# Patient Record
Sex: Male | Born: 1935 | Race: White | Hispanic: No | Marital: Married | Smoking: Former smoker
Health system: Southern US, Community
[De-identification: ages and names within clinical notes are randomized; demographics above are authoritative.]

## PROBLEM LIST (undated history)

## (undated) DIAGNOSIS — H5440 Blindness, one eye, unspecified eye: Secondary | ICD-10-CM

## (undated) DIAGNOSIS — N189 Chronic kidney disease, unspecified: Secondary | ICD-10-CM

## (undated) DIAGNOSIS — E785 Hyperlipidemia, unspecified: Secondary | ICD-10-CM

## (undated) DIAGNOSIS — I5032 Chronic diastolic (congestive) heart failure: Secondary | ICD-10-CM

## (undated) DIAGNOSIS — M069 Rheumatoid arthritis, unspecified: Secondary | ICD-10-CM

## (undated) DIAGNOSIS — K219 Gastro-esophageal reflux disease without esophagitis: Secondary | ICD-10-CM

## (undated) DIAGNOSIS — R002 Palpitations: Secondary | ICD-10-CM

## (undated) DIAGNOSIS — N400 Enlarged prostate without lower urinary tract symptoms: Secondary | ICD-10-CM

## (undated) DIAGNOSIS — IMO0001 Reserved for inherently not codable concepts without codable children: Secondary | ICD-10-CM

## (undated) DIAGNOSIS — M1990 Unspecified osteoarthritis, unspecified site: Secondary | ICD-10-CM

## (undated) DIAGNOSIS — R609 Edema, unspecified: Secondary | ICD-10-CM

## (undated) DIAGNOSIS — K810 Acute cholecystitis: Secondary | ICD-10-CM

## (undated) DIAGNOSIS — Z8679 Personal history of other diseases of the circulatory system: Secondary | ICD-10-CM

## (undated) DIAGNOSIS — K449 Diaphragmatic hernia without obstruction or gangrene: Secondary | ICD-10-CM

## (undated) DIAGNOSIS — E039 Hypothyroidism, unspecified: Secondary | ICD-10-CM

## (undated) DIAGNOSIS — R0789 Other chest pain: Secondary | ICD-10-CM

## (undated) DIAGNOSIS — Z9289 Personal history of other medical treatment: Secondary | ICD-10-CM

## (undated) DIAGNOSIS — I499 Cardiac arrhythmia, unspecified: Secondary | ICD-10-CM

## (undated) HISTORY — DX: Other chest pain: R07.89

## (undated) HISTORY — PX: KNEE SURGERY: SHX244

## (undated) HISTORY — PX: PROSTATE CRYOABLATION: SUR358

## (undated) HISTORY — DX: Chronic kidney disease, unspecified: N18.9

## (undated) HISTORY — PX: SHOULDER SURGERY: SHX246

## (undated) HISTORY — DX: Gastro-esophageal reflux disease without esophagitis: K21.9

## (undated) HISTORY — DX: Benign prostatic hyperplasia without lower urinary tract symptoms: N40.0

## (undated) HISTORY — DX: Chronic diastolic (congestive) heart failure: I50.32

## (undated) HISTORY — DX: Unspecified osteoarthritis, unspecified site: M19.90

## (undated) HISTORY — DX: Edema, unspecified: R60.9

## (undated) HISTORY — DX: Palpitations: R00.2

## (undated) HISTORY — PX: HEMORRHOID SURGERY: SHX153

## (undated) HISTORY — DX: Hyperlipidemia, unspecified: E78.5

## (undated) HISTORY — DX: Personal history of other medical treatment: Z92.89

## (undated) HISTORY — DX: Personal history of other diseases of the circulatory system: Z86.79

## (undated) HISTORY — PX: HERNIA REPAIR: SHX51

## (undated) HISTORY — DX: Rheumatoid arthritis, unspecified: M06.9

## (undated) HISTORY — DX: Diaphragmatic hernia without obstruction or gangrene: K44.9

---

## 1957-02-26 DIAGNOSIS — H5440 Blindness, one eye, unspecified eye: Secondary | ICD-10-CM

## 1957-02-26 HISTORY — DX: Blindness, one eye, unspecified eye: H54.40

## 1998-02-19 ENCOUNTER — Ambulatory Visit (HOSPITAL_COMMUNITY): Admission: RE | Admit: 1998-02-19 | Discharge: 1998-02-19 | Payer: Self-pay | Admitting: Internal Medicine

## 1999-02-18 ENCOUNTER — Ambulatory Visit (HOSPITAL_BASED_OUTPATIENT_CLINIC_OR_DEPARTMENT_OTHER): Admission: RE | Admit: 1999-02-18 | Discharge: 1999-02-18 | Payer: Self-pay | Admitting: Urology

## 1999-11-22 HISTORY — PX: CATARACT EXTRACTION: SUR2

## 2000-03-01 ENCOUNTER — Encounter: Payer: Self-pay | Admitting: *Deleted

## 2000-03-01 ENCOUNTER — Inpatient Hospital Stay (HOSPITAL_COMMUNITY): Admission: EM | Admit: 2000-03-01 | Discharge: 2000-03-02 | Payer: Self-pay | Admitting: Emergency Medicine

## 2000-03-02 ENCOUNTER — Encounter: Payer: Self-pay | Admitting: *Deleted

## 2000-11-22 ENCOUNTER — Inpatient Hospital Stay (HOSPITAL_COMMUNITY): Admission: AD | Admit: 2000-11-22 | Discharge: 2000-11-23 | Payer: Self-pay | Admitting: *Deleted

## 2000-11-23 ENCOUNTER — Encounter: Payer: Self-pay | Admitting: *Deleted

## 2001-07-19 ENCOUNTER — Other Ambulatory Visit: Admission: RE | Admit: 2001-07-19 | Discharge: 2001-07-19 | Payer: Self-pay | Admitting: Urology

## 2001-07-19 ENCOUNTER — Encounter (INDEPENDENT_AMBULATORY_CARE_PROVIDER_SITE_OTHER): Payer: Self-pay | Admitting: Specialist

## 2002-08-21 ENCOUNTER — Ambulatory Visit (HOSPITAL_COMMUNITY): Admission: RE | Admit: 2002-08-21 | Discharge: 2002-08-21 | Payer: Self-pay | Admitting: *Deleted

## 2003-09-16 LAB — HM COLONOSCOPY

## 2004-10-01 ENCOUNTER — Ambulatory Visit: Payer: Self-pay | Admitting: Internal Medicine

## 2004-12-13 ENCOUNTER — Ambulatory Visit: Payer: Self-pay | Admitting: Internal Medicine

## 2005-07-04 ENCOUNTER — Ambulatory Visit: Payer: Self-pay | Admitting: Internal Medicine

## 2005-07-11 ENCOUNTER — Ambulatory Visit: Payer: Self-pay

## 2006-01-23 ENCOUNTER — Ambulatory Visit: Payer: Self-pay | Admitting: Internal Medicine

## 2006-05-16 ENCOUNTER — Ambulatory Visit: Payer: Self-pay | Admitting: Internal Medicine

## 2006-06-05 ENCOUNTER — Ambulatory Visit: Payer: Self-pay | Admitting: Internal Medicine

## 2006-07-05 ENCOUNTER — Ambulatory Visit: Payer: Self-pay | Admitting: Internal Medicine

## 2006-10-27 ENCOUNTER — Ambulatory Visit: Payer: Self-pay | Admitting: Cardiology

## 2006-10-30 ENCOUNTER — Encounter: Payer: Self-pay | Admitting: Cardiology

## 2006-10-30 ENCOUNTER — Ambulatory Visit: Payer: Self-pay | Admitting: Cardiology

## 2006-10-30 ENCOUNTER — Ambulatory Visit: Payer: Self-pay

## 2006-12-05 ENCOUNTER — Ambulatory Visit: Payer: Self-pay | Admitting: Internal Medicine

## 2007-04-05 ENCOUNTER — Encounter: Payer: Self-pay | Admitting: Internal Medicine

## 2007-04-05 DIAGNOSIS — M069 Rheumatoid arthritis, unspecified: Secondary | ICD-10-CM

## 2007-04-05 DIAGNOSIS — N400 Enlarged prostate without lower urinary tract symptoms: Secondary | ICD-10-CM | POA: Insufficient documentation

## 2007-04-05 DIAGNOSIS — Z8679 Personal history of other diseases of the circulatory system: Secondary | ICD-10-CM

## 2007-04-05 DIAGNOSIS — M1990 Unspecified osteoarthritis, unspecified site: Secondary | ICD-10-CM | POA: Insufficient documentation

## 2007-04-05 DIAGNOSIS — E785 Hyperlipidemia, unspecified: Secondary | ICD-10-CM

## 2007-04-05 HISTORY — DX: Rheumatoid arthritis, unspecified: M06.9

## 2007-04-05 HISTORY — DX: Hyperlipidemia, unspecified: E78.5

## 2007-04-05 HISTORY — DX: Unspecified osteoarthritis, unspecified site: M19.90

## 2007-04-05 HISTORY — DX: Benign prostatic hyperplasia without lower urinary tract symptoms: N40.0

## 2007-04-05 HISTORY — DX: Personal history of other diseases of the circulatory system: Z86.79

## 2007-08-07 ENCOUNTER — Encounter: Payer: Self-pay | Admitting: Internal Medicine

## 2007-08-07 ENCOUNTER — Telehealth: Payer: Self-pay | Admitting: Internal Medicine

## 2007-08-07 ENCOUNTER — Ambulatory Visit: Payer: Self-pay | Admitting: Cardiology

## 2007-08-15 ENCOUNTER — Ambulatory Visit: Payer: Self-pay

## 2007-08-15 ENCOUNTER — Encounter: Payer: Self-pay | Admitting: Internal Medicine

## 2007-10-15 ENCOUNTER — Telehealth: Payer: Self-pay | Admitting: Internal Medicine

## 2007-10-29 ENCOUNTER — Encounter: Payer: Self-pay | Admitting: Internal Medicine

## 2007-11-09 ENCOUNTER — Encounter: Payer: Self-pay | Admitting: Internal Medicine

## 2007-11-13 ENCOUNTER — Encounter: Payer: Self-pay | Admitting: Internal Medicine

## 2007-12-03 ENCOUNTER — Ambulatory Visit: Payer: Self-pay | Admitting: Internal Medicine

## 2007-12-03 DIAGNOSIS — K219 Gastro-esophageal reflux disease without esophagitis: Secondary | ICD-10-CM | POA: Insufficient documentation

## 2007-12-03 HISTORY — DX: Gastro-esophageal reflux disease without esophagitis: K21.9

## 2007-12-18 ENCOUNTER — Encounter: Payer: Self-pay | Admitting: Internal Medicine

## 2007-12-24 ENCOUNTER — Telehealth: Payer: Self-pay | Admitting: Internal Medicine

## 2007-12-25 ENCOUNTER — Encounter: Payer: Self-pay | Admitting: Internal Medicine

## 2008-01-03 ENCOUNTER — Telehealth (INDEPENDENT_AMBULATORY_CARE_PROVIDER_SITE_OTHER): Payer: Self-pay | Admitting: *Deleted

## 2008-01-29 ENCOUNTER — Telehealth: Payer: Self-pay | Admitting: Internal Medicine

## 2008-01-30 ENCOUNTER — Encounter: Admission: RE | Admit: 2008-01-30 | Discharge: 2008-01-30 | Payer: Self-pay | Admitting: Orthopedic Surgery

## 2008-02-04 ENCOUNTER — Ambulatory Visit (HOSPITAL_BASED_OUTPATIENT_CLINIC_OR_DEPARTMENT_OTHER): Admission: RE | Admit: 2008-02-04 | Discharge: 2008-02-04 | Payer: Self-pay | Admitting: Orthopedic Surgery

## 2008-05-01 ENCOUNTER — Ambulatory Visit: Payer: Self-pay | Admitting: Internal Medicine

## 2008-05-01 DIAGNOSIS — R609 Edema, unspecified: Secondary | ICD-10-CM

## 2008-05-01 HISTORY — DX: Edema, unspecified: R60.9

## 2008-06-03 ENCOUNTER — Encounter: Payer: Self-pay | Admitting: Internal Medicine

## 2008-09-03 ENCOUNTER — Encounter (INDEPENDENT_AMBULATORY_CARE_PROVIDER_SITE_OTHER): Payer: Self-pay

## 2008-09-17 ENCOUNTER — Ambulatory Visit: Payer: Self-pay | Admitting: Cardiology

## 2008-09-26 ENCOUNTER — Encounter: Payer: Self-pay | Admitting: Internal Medicine

## 2008-09-29 ENCOUNTER — Encounter: Payer: Self-pay | Admitting: Internal Medicine

## 2008-09-30 ENCOUNTER — Ambulatory Visit: Payer: Self-pay

## 2008-11-04 ENCOUNTER — Ambulatory Visit: Payer: Self-pay | Admitting: Internal Medicine

## 2008-12-07 ENCOUNTER — Emergency Department (HOSPITAL_COMMUNITY): Admission: EM | Admit: 2008-12-07 | Discharge: 2008-12-07 | Payer: Self-pay | Admitting: Emergency Medicine

## 2009-02-12 ENCOUNTER — Encounter: Payer: Self-pay | Admitting: Internal Medicine

## 2009-04-07 ENCOUNTER — Ambulatory Visit: Payer: Self-pay | Admitting: Internal Medicine

## 2009-04-25 DIAGNOSIS — R0789 Other chest pain: Secondary | ICD-10-CM | POA: Insufficient documentation

## 2009-04-25 HISTORY — DX: Other chest pain: R07.89

## 2009-04-29 ENCOUNTER — Ambulatory Visit: Payer: Self-pay | Admitting: Cardiology

## 2009-04-30 ENCOUNTER — Encounter: Payer: Self-pay | Admitting: Internal Medicine

## 2009-05-01 ENCOUNTER — Encounter: Payer: Self-pay | Admitting: Internal Medicine

## 2009-06-17 ENCOUNTER — Telehealth: Payer: Self-pay | Admitting: Internal Medicine

## 2009-06-22 ENCOUNTER — Ambulatory Visit: Payer: Self-pay | Admitting: Internal Medicine

## 2009-06-23 ENCOUNTER — Ambulatory Visit: Payer: Self-pay

## 2009-06-23 ENCOUNTER — Encounter: Payer: Self-pay | Admitting: Internal Medicine

## 2009-06-25 ENCOUNTER — Encounter: Payer: Self-pay | Admitting: Internal Medicine

## 2009-07-14 ENCOUNTER — Encounter (INDEPENDENT_AMBULATORY_CARE_PROVIDER_SITE_OTHER): Payer: Self-pay | Admitting: *Deleted

## 2009-07-20 ENCOUNTER — Encounter: Payer: Self-pay | Admitting: Internal Medicine

## 2009-08-07 ENCOUNTER — Encounter: Payer: Self-pay | Admitting: Internal Medicine

## 2009-08-10 ENCOUNTER — Encounter: Payer: Self-pay | Admitting: Internal Medicine

## 2009-08-12 ENCOUNTER — Encounter (INDEPENDENT_AMBULATORY_CARE_PROVIDER_SITE_OTHER): Payer: Self-pay | Admitting: *Deleted

## 2009-08-27 ENCOUNTER — Encounter: Payer: Self-pay | Admitting: Internal Medicine

## 2009-09-15 ENCOUNTER — Telehealth: Payer: Self-pay | Admitting: Internal Medicine

## 2009-10-01 ENCOUNTER — Encounter: Payer: Self-pay | Admitting: Internal Medicine

## 2009-11-04 ENCOUNTER — Ambulatory Visit: Payer: Self-pay | Admitting: Cardiology

## 2009-12-11 ENCOUNTER — Telehealth: Payer: Self-pay | Admitting: Internal Medicine

## 2009-12-15 ENCOUNTER — Encounter: Payer: Self-pay | Admitting: Cardiology

## 2009-12-28 ENCOUNTER — Ambulatory Visit: Payer: Self-pay | Admitting: Internal Medicine

## 2009-12-29 LAB — CONVERTED CEMR LAB
ALT: 18 units/L (ref 0–53)
AST: 20 units/L (ref 0–37)
Albumin: 4.1 g/dL (ref 3.5–5.2)
Alkaline Phosphatase: 59 units/L (ref 39–117)
BUN: 23 mg/dL (ref 6–23)
Basophils Absolute: 0 10*3/uL (ref 0.0–0.1)
Basophils Relative: 0.1 % (ref 0.0–3.0)
Bilirubin, Direct: 0.1 mg/dL (ref 0.0–0.3)
CO2: 30 meq/L (ref 19–32)
Calcium: 9.3 mg/dL (ref 8.4–10.5)
Chloride: 111 meq/L (ref 96–112)
Cholesterol: 140 mg/dL (ref 0–200)
Creatinine, Ser: 1.7 mg/dL — ABNORMAL HIGH (ref 0.4–1.5)
Eosinophils Absolute: 0.2 10*3/uL (ref 0.0–0.7)
Eosinophils Relative: 2.6 % (ref 0.0–5.0)
GFR calc non Af Amer: 40.51 mL/min (ref >60)
Glucose, Bld: 117 mg/dL — ABNORMAL HIGH (ref 70–99)
HCT: 44.6 % (ref 39.0–52.0)
HDL: 50.7 mg/dL (ref >39.00)
Hemoglobin: 15 g/dL (ref 13.0–17.0)
LDL Cholesterol: 76 mg/dL (ref 0–99)
Lymphocytes Relative: 18.8 % (ref 12.0–46.0)
Lymphs Abs: 1.1 10*3/uL (ref 0.7–4.0)
MCHC: 33.6 g/dL (ref 30.0–36.0)
MCV: 100 fL (ref 78.0–100.0)
Monocytes Absolute: 0.6 10*3/uL (ref 0.1–1.0)
Monocytes Relative: 10.6 % (ref 3.0–12.0)
Neutro Abs: 4 10*3/uL (ref 1.4–7.7)
Neutrophils Relative %: 67.9 % (ref 43.0–77.0)
PSA: 3.23 ng/mL (ref 0.10–4.00)
Platelets: 202 10*3/uL (ref 150.0–400.0)
Potassium: 4.4 meq/L (ref 3.5–5.1)
RBC: 4.46 M/uL (ref 4.22–5.81)
RDW: 13.2 % (ref 11.5–14.6)
Sodium: 146 meq/L — ABNORMAL HIGH (ref 135–145)
TSH: 2.92 microintl units/mL (ref 0.35–5.50)
Total Bilirubin: 0.6 mg/dL (ref 0.3–1.2)
Total CHOL/HDL Ratio: 3
Total Protein: 6.5 g/dL (ref 6.0–8.3)
Triglycerides: 69 mg/dL (ref 0.0–149.0)
VLDL: 13.8 mg/dL (ref 0.0–40.0)
WBC: 5.9 10*3/uL (ref 4.5–10.5)

## 2009-12-30 ENCOUNTER — Encounter: Payer: Self-pay | Admitting: Internal Medicine

## 2009-12-31 ENCOUNTER — Telehealth: Payer: Self-pay | Admitting: Internal Medicine

## 2010-02-12 ENCOUNTER — Ambulatory Visit: Payer: Self-pay | Admitting: Internal Medicine

## 2010-02-12 ENCOUNTER — Telehealth: Payer: Self-pay | Admitting: Internal Medicine

## 2010-02-12 DIAGNOSIS — R002 Palpitations: Secondary | ICD-10-CM

## 2010-02-12 HISTORY — DX: Palpitations: R00.2

## 2010-02-12 LAB — CONVERTED CEMR LAB
BUN: 20 mg/dL (ref 6–23)
CO2: 31 meq/L (ref 19–32)
Calcium: 9.8 mg/dL (ref 8.4–10.5)
Chloride: 106 meq/L (ref 96–112)
Creatinine, Ser: 1.6 mg/dL — ABNORMAL HIGH (ref 0.4–1.5)
GFR calc non Af Amer: 43.43 mL/min (ref >60)
Glucose, Bld: 103 mg/dL — ABNORMAL HIGH (ref 70–99)
Potassium: 4.3 meq/L (ref 3.5–5.1)
Sodium: 144 meq/L (ref 135–145)

## 2010-03-03 ENCOUNTER — Ambulatory Visit (HOSPITAL_COMMUNITY): Admission: RE | Admit: 2010-03-03 | Discharge: 2010-03-03 | Payer: Self-pay | Admitting: Internal Medicine

## 2010-03-03 ENCOUNTER — Ambulatory Visit: Payer: Self-pay

## 2010-03-03 ENCOUNTER — Encounter: Payer: Self-pay | Admitting: Internal Medicine

## 2010-03-03 ENCOUNTER — Ambulatory Visit: Payer: Self-pay | Admitting: Cardiovascular Disease

## 2010-03-30 ENCOUNTER — Encounter: Payer: Self-pay | Admitting: Internal Medicine

## 2010-03-31 ENCOUNTER — Encounter: Payer: Self-pay | Admitting: Internal Medicine

## 2010-04-22 ENCOUNTER — Telehealth: Payer: Self-pay | Admitting: Internal Medicine

## 2010-08-05 ENCOUNTER — Encounter: Payer: Self-pay | Admitting: Cardiology

## 2010-08-05 ENCOUNTER — Encounter: Payer: Self-pay | Admitting: Internal Medicine

## 2010-08-09 ENCOUNTER — Ambulatory Visit: Payer: Self-pay | Admitting: Cardiology

## 2010-08-12 ENCOUNTER — Telehealth: Payer: Self-pay | Admitting: Cardiology

## 2010-08-12 ENCOUNTER — Telehealth: Payer: Self-pay | Admitting: Internal Medicine

## 2010-10-28 ENCOUNTER — Encounter: Payer: Self-pay | Admitting: Internal Medicine

## 2010-12-21 NOTE — Progress Notes (Signed)
Summary: samples Plavix?  Phone Note Call from Patient Call back at Home Phone [phone]   Caller: live Call For: kwia Summary of Call: 1) Needs refill for the Plavix to [facility] mail order, phone number [phone].  He can't do it himself & isn't sure if they sent a request to Dr. Raliegh Ip. - done 2) Any samples of Plavix he could pick up early next week? Initial call taken by: Shelbie Hutching, RN,  December 11, 2009 10:07 AM  Follow-up for Phone Call        please refill and let [name] know that I will call when any samples arrive Follow-up by: Marletta Lor  MD,  December 11, 2009 12:37 PM  Additional Follow-up for Phone Call Additional follow up Details #1::        Phone Call Completed Additional Follow-up by: Shelbie Hutching, RN,  December 11, 2009 12:40 PM    Prescriptions: PLAVIX 75 MG TABS (CLOPIDOGREL BISULFATE) Take 1 tablet by mouth once a day  #90 x 6   Entered by:   Shelbie Hutching, RN   Authorized by:   Marletta Lor  MD   Signed by:   Shelbie Hutching, RN on 12/11/2009   Method used:   Faxed to ...       [facility] ([facility]order)             , [place]         Ph:        Fax: [phone]   RxID:   [id number]

## 2010-12-21 NOTE — Progress Notes (Signed)
Summary: WANT LABS FAXED TO HIM.   Phone Note Call from Patient Call back at Home Phone [phone]   Caller: Mom Summary of Call: PT WOULD LIKE HIS LAB RESULTS FAXED TO HIM IF POSSIBLE AT [id number] Initial call taken by: Delsa Sale,  February 12, 2010 3:42 PM  Follow-up for Phone Call        Dr.Shainna Faux called him with lab results. Follow-up by: Georgetta Haber RN

## 2010-12-21 NOTE — Medication Information (Signed)
Summary: Protonix  Protonix   Imported By: Marilynne Drivers 01/12/2010 16:42:29  _____________________________________________________________________  External Attachment:    Type:   Image     Comment:   External Document

## 2010-12-21 NOTE — Assessment & Plan Note (Signed)
Summary: cpx/mm---will fast//ccm   Vital Signs:  Patient profile:   77 year old male Weight:      153 pounds BP sitting:   120 / 80  (right arm) Cuff size:   regular  Vitals Entered By: Chipper Oman, RN (December 28, 2009 8:44 AM) CC: cpx , fasting , sees cardiologist    Primary Care Ramone Gander:  Marletta Lor  MD  CC:  cpx , fasting , and sees cardiologist .  History of Present Illness: 77  year-old patient, who is followed range [facility] for her rheumatoid arthritis.  Is also followed by cardiology.  He states that he has had a urological evaluation within the past few months.  He has a history of osteoarthritis in addition to his RA.  He has a history of BPH.  Additional problems include a history of TIAs Gastrosoft reflux disease and dyslipidemia.  He has chronic kidney disease with his last creatinine noted to be 1.62.  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 Hemorrhoidectomy 1973 Inguinal herniorrhaphy left-sided Dr. Annamaria Boots right cataract surgery left knee surgery  L shoulder surgery 3-09 Left cataract surgery 12-09  Family History: Reviewed history from 04/25/2009 and no changes required. father died at age 75  Social History: Reviewed history from 04/25/2009 and no changes required. 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         denies any exertional chest pain or any focal neurological symptoms  Physical Exam  General:  overweight-appearing.  112/72overweight-appearing.   Head:  Normocephalic and atraumatic without obvious abnormalities. No apparent alopecia or balding. Eyes:  dilated left pupil blind left eye; right pupil myotic and fundi not well-visualized Ears:  External ear exam shows no significant lesions or deformities.  Otoscopic examination reveals clear canals, tympanic membranes are intact bilaterally without bulging, retraction, inflammation or discharge. Hearing is grossly normal bilaterally. Mouth:  Oral mucosa and oropharynx without lesions or exudates.  upper dentures in place Neck:  No deformities, masses, or tenderness noted. Chest Wall:  No deformities, masses, tenderness or gynecomastia noted. Breasts:  No masses or gynecomastia noted Lungs:  Normal respiratory effort, chest expands symmetrically. Lungs are clear to auscultation, no crackles or wheezes. Heart:  Normal rate and regular rhythm. S1 and S2 normal without gallop, murmur, click, rub or other extra sounds. Abdomen:  Bowel sounds positive,abdomen soft and non-tender without masses, organomegaly or hernias noted. ventral hernia noted Rectal:  per urology, December 2010 Genitalia:  Testes bilaterally descended without nodularity, tenderness or masses. No scrotal masses or lesions. No penis lesions or urethral discharge. left testicle slightly  atrophic Prostate:  per urology, December 2010 Msk:  No deformity or scoliosis noted  of thoracic or lumbar spine.   Pulses:  faint Extremities:  No clubbing, cyanosis, edema, or deformity noted with normal full range of motion of all joints.   Neurologic:  alert & oriented X3, cranial nerves II-XII intact, strength normal in all extremities, sensation intact to light touch, sensation intact to pinprick, gait normal, DTRs symmetrical and normal, finger-to-nose normal, heel-to-shin normal, and toes down bilaterally on Babinski.  alert & oriented X3, cranial nerves II-XII intact, strength normal in all extremities, sensation intact to light touch, sensation intact to pinprick, gait normal, DTRs symmetrical and normal, finger-to-nose normal, heel-to-shin normal, and toes down bilaterally on Babinski.   Skin:  Intact without suspicious lesions or rashes Cervical Nodes:  No lymphadenopathy noted Axillary Nodes:  No palpable lymphadenopathy Inguinal Nodes:  No significant adenopathy Psych:  Cognition and judgment appear intact. Alert and cooperative with normal attention span and concentration. No apparent delusions, illusions, hallucinations   Impression & Recommendations:  Problem # 1:  HYPERLIPIDEMIA (ICD-272.4)  The following medications were removed from the medication list:    Vytorin 10-20 Mg Tabs (Ezetimibe-simvastatin) .Marland Kitchen... 1 tab once daily His updated medication list for this problem includes:    Simvastatin 20 Mg Tabs (Simvastatin) ..... One tablet daily  His updated medication list for this problem includes:    Vytorin 10-20 Mg Tabs (Ezetimibe-simvastatin) .Marland Kitchen... 1 tab once daily  Orders: Venipuncture [id number]) TLB-Lipid Panel (80061-LIPID) TLB-BMP (Basic Metabolic Panel-BMET) (99991111) TLB-CBC Platelet - w/Differential (85025-CBCD) TLB-Hepatic/Liver Function Pnl (80076-HEPATIC) TLB-TSH (Thyroid Stimulating Hormone) (84443-TSH)  Problem # 2:  TRANSIENT ISCHEMIC ATTACK, HX OF  (ICD-V12.50)  Orders: Prescription Created Electronically [phone])  Problem # 3:  GERD (ICD-530.81)  His updated medication list for this problem includes:    Pantoprazole Sodium 40 Mg Tbec (Pantoprazole sodium) .Marland Kitchen... 1 tab once daily  His updated medication list for this problem includes:    Pantoprazole Sodium 40 Mg Tbec (Pantoprazole sodium) .Marland Kitchen... 1 tab once daily  Orders: Venipuncture [id number]) TLB-BMP (Basic Metabolic Panel-BMET) (99991111) TLB-CBC Platelet - w/Differential (85025-CBCD) TLB-Hepatic/Liver Function Pnl (80076-HEPATIC) TLB-TSH (Thyroid Stimulating Hormone) (84443-TSH)  Problem # 4:  OSTEOARTHRITIS (ICD-715.90)  The following medications were removed from the medication list:    Mobic 7.5 Mg Tabs (Meloxicam) .Marland Kitchen... Take 1 tablet by mouth once a day His updated medication list for this problem includes:    Aspirin 81 Mg Chew (Aspirin) ..... One daily  His updated medication list for this problem includes:    Mobic 7.5 Mg Tabs (Meloxicam) .Marland Kitchen... Take 1 tablet by mouth once a day  Orders: Prescription Created Electronically [phone]) Venipuncture [id number]) TLB-BMP (Basic Metabolic Panel-BMET) (99991111) TLB-CBC Platelet - w/Differential (85025-CBCD) TLB-Hepatic/Liver Function Pnl (80076-HEPATIC) TLB-TSH (Thyroid Stimulating Hormone) (84443-TSH)  Complete Medication List: 1)  Folic Acid 1 Mg Tabs (Folic acid) .... Take 1 tablet by mouth once a day 2)  Methotrexate 2.5 Mg Tabs (Methotrexate sodium) .... Take 3 once a week 3)  Plavix 75 Mg Tabs (Clopidogrel bisulfate) .... Take 1 tablet by mouth once a day 4)  Pantoprazole Sodium 40 Mg Tbec (Pantoprazole sodium) .Marland Kitchen.. 1 tab once daily 5)  Multivitamins Tabs (Multiple vitamin) .Marland Kitchen.. 1 tab once daily 6)  Vitamin C 500 Mg Tabs (Ascorbic acid) .Marland Kitchen.. 1 tab once daily 7)  Calcium-vitamin D 250-125 Mg-unit Tabs (Calcium carbonate-vitamin d) .Marland Kitchen.. 1 tab once daily 8)  Simvastatin 20 Mg Tabs (Simvastatin) .... One tablet  daily 9)  Aspirin 81 Mg Chew (Aspirin) .... One daily  Other Orders: TLB-PSA (Prostate Specific Antigen) (84153-PSA)  Patient Instructions: 1)  Please schedule a follow-up appointment in 1 year. 2)  Limit your Sodium (Salt) to less than 2 grams a day(slightly less than 1/2 a teaspoon) to prevent fluid retention, swelling, or worsening of symptoms. 3)  It is important that you exercise regularly at least 20 minutes 5 times a week. If you develop chest pain, have severe difficulty breathing, or feel very tired , stop exercising immediately and seek medical attention. 4)  You need to lose weight. Consider a lower calorie diet and regular exercise.  5)  Take 650-1000mg  of Tylenol every 4-6 hours as needed for relief of pain or comfort of fever AVOID taking more than 4000mg   in a 24 hour period (can cause liver damage in higher doses). Prescriptions: SIMVASTATIN 20 MG TABS (SIMVASTATIN) one tablet daily  #90 x 6   Entered and Authorized by:   Marletta Lor  MD   Signed by:   Marletta Lor  MD on 12/28/2009   Method used:   Print then Give to Patient   RxID:   [id number] PANTOPRAZOLE SODIUM 40 MG TBEC (PANTOPRAZOLE SODIUM) 1 tab once daily  #90 x 6   Entered and Authorized by:   Marletta Lor  MD   Signed by:   Marletta Lor  MD on 12/28/2009   Method used:   Print then Give to Patient   RxID:   [id number] PLAVIX 75 MG TABS (CLOPIDOGREL BISULFATE) Take 1 tablet by mouth once a day  #90 x 6   Entered and Authorized by:   Marletta Lor  MD   Signed by:   Marletta Lor  MD on 12/28/2009   Method used:   Print then Give to Patient   RxID:   [id number] SIMVASTATIN 20 MG TABS (SIMVASTATIN) one tablet daily  #90 x 6   Entered and Authorized by:   Marletta Lor  MD   Signed by:   Marletta Lor  MD on 12/28/2009   Method used:   Electronically to        [facility] (mail-order)       [place]  [address]       Ph: [phone]       Fax: [phone]   RxIDOJ:[id number] PANTOPRAZOLE SODIUM 40 MG TBEC (PANTOPRAZOLE SODIUM) 1 tab once daily  #90 x 6   Entered and Authorized by:   Marletta Lor  MD   Signed by:   Marletta Lor  MD on 12/28/2009   Method used:   Electronically to        [facility] (mail-[place]  [address]       Ph: [phone]       Fax: [phone]   RxIDMC:[id number] PLAVIX 75 MG TABS (CLOPIDOGREL BISULFATE) Take 1 tablet by mouth once a day  #90 x 6   Entered and Authorized by:   Marletta Lor  MD   Signed by:   Marletta Lor  MD on 12/28/2009   Method used:   Electronically to        [facility] (mail-order)       [address] [place]  [address]       Ph:  [phone]       Fax: [phone]   RxIDBO:[id number]

## 2010-12-21 NOTE — Progress Notes (Signed)
Summary: lab results   Phone Note Call from Patient   Caller: Patient [phone] Reason for Call: Talk to Nurse Summary of Call: pt calling to see if blood work he faxed over has been recieved and wants results of cholesterol Initial call taken by: Lorenda Hatchet,  August 12, 2010 1:38 PM  Follow-up for Phone Call        pt given Dr Winnifred Friar recommendation of going back on statin, he states that looking at his #s he agrees and restarted 2 days ago Kevan Rosebush, RN  August 12, 2010 2:02 PM

## 2010-12-21 NOTE — Progress Notes (Signed)
Summary: Needs Samples  Phone Note Call from Patient   Caller: Patient Summary of Call: Needs samples of Vytorin and Protonix Initial call taken by: Despina Arias,  August 12, 2010 10:16 AM  Follow-up for Phone Call        done Follow-up by: Marletta Lor  MD,  August 12, 2010 11:21 AM

## 2010-12-21 NOTE — Letter (Signed)
Summary: [facility]   Imported By: Laural Benes 01/13/2010 10:16:30  _____________________________________________________________________  External Attachment:    Type:   Image     Comment:   External Document

## 2010-12-21 NOTE — Progress Notes (Signed)
Summary: INFO ONLY CONCERNING PNEUMOVAX  Phone Note Call from Patient   Caller: Patient [phone] Reason for Call: Talk to Nurse Summary of Call: INFO ONLYL--------Pt called to adv that he received his Pneumovax Vaccine yesterday (12/30/2009)... Adv that he wanted to make Dr Marthann Schiller nurse aware of same.  Initial call taken by: Duanne Moron,  December 31, 2009 11:09 AM      Immunization History:  Pneumovax Immunization History:    Pneumovax:  historical (12/30/2009)

## 2010-12-21 NOTE — Progress Notes (Signed)
Summary: REQUEST FOR SAMPLES  Phone Note Call from Patient   Caller: Patient  [phone] Reason for Call: Talk to Nurse, Talk to Doctor Summary of Call: Pt is requesting samples of some type of allergy medication to help alleviate his sxs...Marland KitchenMarland Kitchen Pt c/o watery eyes, runny nose and believes he has allergies.... Pt req that someone call him when they are ready for p/u..... Pt can be reached at [phone].  Initial call taken by: Duanne Moron,  April 22, 2010 8:45 AM  Follow-up for Phone Call        called Follow-up by: Marletta Lor  MD,  April 22, 2010 12:44 PM

## 2010-12-21 NOTE — Letter (Signed)
Summary: [facility]   Imported By: Laural Benes 04/07/2010 10:25:15  _____________________________________________________________________  External Attachment:    Type:   Image     Comment:   External Document

## 2010-12-21 NOTE — Assessment & Plan Note (Signed)
Summary: 6 month follow up/mt  Medications Added METOPROLOL TARTRATE 25 MG TABS (METOPROLOL TARTRATE) 1/2 tab once daily        Visit Type:  6 mo f/u Primary Provider:  Marletta Lor  MD  CC:  edema/left leg ......sob....denies any cp...pt c/o fatigue though he states he went to [facility] Saturday for [employer]...also states he went to a party yesterday.  History of Present Illness: [name] returns today for his management of palpitations.  He is only taking a half of a 25 mg tablet or 12.5 mg of metoprolol once a day. He has not had a flare of his palpitations or PVCs.  He denies any symptoms of TIAs or mini strokes. He continues on Plavix. He has discontinued his statin.  Carotid Dopplers in August of 2000 and showed nonobstructive plaque and antegrade flow in the vertebrals. 2-D echocardiogram showed EF of 60-65% with trivial aortic regurgitation. He did have some mild to moderate LVH.  He's had some peripheral edema which is about the same. He denies orthopnea or PND.  Current Medications (verified): 1)  Folic Acid 1 Mg Tabs (Folic Acid) .... Take 1 Tablet By Mouth Once A Day 2)  Methotrexate 2.5 Mg Tabs (Methotrexate Sodium) .... Take 3 Once A Week 3)  Plavix 75 Mg Tabs (Clopidogrel Bisulfate) .... Take 1 Tablet By Mouth Once A Day 4)  Pantoprazole Sodium 40 Mg Tbec (Pantoprazole Sodium) .Marland Kitchen.. 1 Tab Once Daily 5)  Multivitamins   Tabs (Multiple Vitamin) .Marland Kitchen.. 1 Tab Once Daily 6)  Vitamin C 500 Mg Tabs (Ascorbic Acid) .Marland Kitchen.. 1 Tab Once Daily 7)  Calcium-Carb 600 + D 600-125 Mg-Unit Tabs (Calcium-Vitamin D) .... Take One Daily 8)  Tylenol 325 Mg Tabs (Acetaminophen) .... As Needed 9)  Metoprolol Tartrate 25 Mg Tabs (Metoprolol Tartrate) .... 1/2 Tab Once Daily  Allergies: 1)  ! 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 Annamaria Boots right cataract surgery left knee surgery  L shoulder surgery 3-09 Left cataract surgery 12-09  Family History: Last updated: 2009/05/24 father died at age 66  Social History: Last updated: May 24, 2009 Married retired Administrator, Civil Service  Risk Factors: Smoking Status: quit (04/05/2007)  Review of Systems       negative other than history of present illness  Vital Signs:  Patient profile:   77 year old male Height:      63 inches Weight:      155.8 pounds Pulse rate:   84 / minute Pulse rhythm:   regular BP sitting:   112 / 78  (left arm) Cuff size:   large  Vitals Entered By: Julaine Hua, CMA (August 09, 2010 2:19 PM)  Physical Exam  General:  Well developed, well nourished, in no acute distress. Head:  normocephalic and atraumatic Eyes:  glasses otherwise normal Neck:  Neck supple, no JVD. No masses, thyromegaly or abnormal cervical nodes. Chest Tevan Marian:  no deformities or breast masses noted Lungs:  Clear bilaterally to auscultation and percussion. Heart:  PMI nondisplaced, regular rate and rhythm, no murmur. Carotid upstrokes equal bilaterally without bruits Msk:  decreased ROM.  Pulses:  diminished but present the lower extremities. Extremities:  dependent rubor1+ left pedal edema and 1+ right pedal edema.   Neurologic:  Alert and oriented x 3. Skin:  Intact without lesions or rashes. Psych:  Normal affect.   Impression & Recommendations:  Problem # 1:  PALPITATIONS (ICD-785.1) Assessment Improved  His updated medication list for this problem  includes:    Plavix 75 Mg Tabs (Clopidogrel bisulfate) .Marland Kitchen... Take 1 tablet by mouth once a day    Metoprolol Tartrate 25 Mg Tabs (Metoprolol tartrate) .Marland Kitchen... 1/2 tab once daily  His updated medication list for this problem includes:    Plavix 75 Mg Tabs (Clopidogrel bisulfate) .Marland Kitchen... Take 1 tablet by mouth once a day    Metoprolol Tartrate 25 Mg Tabs (Metoprolol tartrate) .Marland Kitchen... 1/2 tab once daily  Problem # 2:  PEDAL EDEMA (ICD-782.3) Assessment: Unchanged  Problem # 3:  TRANSIENT ISCHEMIC ATTACK, HX OF (ICD-V12.50) Assessment: Improved  Patient Instructions: 1)  Your physician recommends that you schedule a follow-up appointment in: 6 months with Dr. Verl Blalock 2)  Your physician recommends that you continue on your current medications as directed. Please refer to the Current Medication list given to you today.

## 2010-12-21 NOTE — Assessment & Plan Note (Signed)
Summary: palpitations  Medications Added CALCIUM-CARB 600 + D 600-125 MG-UNIT TABS (CALCIUM-VITAMIN D) take one daily VYTORIN 10-20 MG TABS (EZETIMIBE-SIMVASTATIN) Take one tablet by mouth daily at bedtime TYLENOL 325 MG TABS (ACETAMINOPHEN) as needed METOPROLOL TARTRATE 25 MG TABS (METOPROLOL TARTRATE) 1 tab 2 time per day        Primary Provider:  Marletta Lor  MD  CC:  palpitations.  History of Present Illness: Patient is an 77 year old with a history of PVCs, chest pain and dyslipidemia.   He presents today for evalution of palpitations. The patient says he was watching the [employer] basketball game last night.  He bagan to notice his heart skipping.  He denied dizziness.  No chest pain.  No shortness of breath.  He went to bed.  This morning he did not sense the palpitations but when he took his pulse he noticed it was irregular. He notes no recent chest pain.  He notes no change in his ability to do things.   Current Medications (verified): 1)  Folic Acid 1 Mg Tabs (Folic Acid) .... Take 1 Tablet By Mouth Once A Day 2)  Methotrexate 2.5 Mg Tabs (Methotrexate Sodium) .... Take 3 Once A Week 3)  Plavix 75 Mg Tabs (Clopidogrel Bisulfate) .... Take 1 Tablet By Mouth Once A Day 4)  Pantoprazole Sodium 40 Mg Tbec (Pantoprazole Sodium) .Marland Kitchen.. 1 Tab Once Daily 5)  Multivitamins   Tabs (Multiple Vitamin) .Marland Kitchen.. 1 Tab Once Daily 6)  Vitamin C 500 Mg Tabs (Ascorbic Acid) .Marland Kitchen.. 1 Tab Once Daily 7)  Calcium-Carb 600 + D 600-125 Mg-Unit Tabs (Calcium-Vitamin D) .... Take One Daily 8)  Vytorin 10-20 Mg Tabs (Ezetimibe-Simvastatin) .... Take One Tablet By Mouth Daily At Bedtime 9)  Tylenol 325 Mg Tabs (Acetaminophen) .... As Needed  Allergies: 1)  ! Sulfa  Past History:  Past medical, surgical, family and social histories (including risk factors) reviewed, and no changes noted (except as noted below).  Past Medical History: Reviewed history from 12/28/2009 and no changes required. CHEST  DISCOMFORT (ICD-786.59) HYPERLIPIDEMIA (ICD-272.4) PEDAL EDEMA (ICD-782.3) TRANSIENT ISCHEMIC ATTACK, HX OF (ICD-V12.50) GERD (ICD-530.81) OSTEOARTHRITIS (ICD-715.90) DEGENERATIVE JOINT DISEASE (ICD-715.90) RHEUMATOID ARTHRITIS (ICD-714.0) BENIGN PROSTATIC HYPERTROPHY (ICD-600.00) history of PMR, 1997 history left central vein thrombosis December 2003 benign PVCs chronic kidney disease (cr 1.62)  Blind OS   Past Surgical History: Reviewed history from 04/25/2009 and no changes required. Hemorrhoidectomy 1973 Inguinal herniorrhaphy left-sided Dr. Annamaria Boots right cataract surgery left knee surgery  L shoulder surgery 3-09 Left cataract surgery 12-09  Family History: Reviewed history from 04/25/2009 and no changes required. father died at age 20  Social History: Reviewed history from 04/25/2009 and no changes required. Married retired Administrator, [employer] Service  Vital Signs:  Patient profile:   77 year old male Weight:      151.50 pounds Pulse rate:   92 / minute BP sitting:   126 / 84  (left arm)  Vitals Entered By: Theodosia Quay, RN, BSN (February 12, 2010 9:00 AM)  Physical Exam  Additional Exam:  Patient is in NAD HEENT:  Normocephalic, atraumatic. EOMI, PERRLA.  Neck: JVP is normal. No thyromegaly. No bruits.  Lungs: clear to auscultation. No rales no wheezes.  Heart: Regular rate and rhythm. Normal S1, S2. No S3.   No significant murmurs. PMI not displaced.  Abdomen:  Supple, nontender. Normal bowel sounds. No masses. No hepatomegaly.  Extremities:   Good distal pulses throughout. No lower extremity edema.  Musculoskeletal :moving all extremities.  Neuro:   alert and oriented x3.    EKG  Procedure date:  02/12/2010  Findings:      NSR.  92 bpm.  Occasional PVC.  Impression & Recommendations:  Problem # 1:  PALPITATIONS (ICD-785.1) patient with palpitations last night that he sensed.  Today pulse is irregular.  Not symptomatic.  nO recent chest pains. exam is unremarkable.   EKG with occasional PVCs.   Rec:  echo to reeval LV function.  Low dose b blocker (12.5 mg two times a day)  BMET today. The following medications were removed from the medication list:    Aspirin 81 Mg Chew (Aspirin) ..... One daily His updated medication list for this problem includes:    Plavix 75 Mg Tabs (Clopidogrel bisulfate) .Marland Kitchen... Take 1 tablet by mouth once a day    Metoprolol Tartrate 25 Mg Tabs (Metoprolol tartrate) .Marland Kitchen... 1 tab 2 time per day  Problem # 2:  CHEST DISCOMFORT (ICD-786.59) No recent discomfort.  No change in his abiltity to do things.  Continue meds.  Low dose b blocker. The following medications were removed from the medication list:    Aspirin 81 Mg Chew (Aspirin) ..... One daily His updated medication list for this problem includes:    Plavix 75 Mg Tabs (Clopidogrel bisulfate) .Marland Kitchen... Take 1 tablet by mouth once a day    Metoprolol Tartrate 25 Mg Tabs (Metoprolol tartrate) .Marland Kitchen... 1 tab 2 time per day  Orders: EKG w/ Interpretation (93000) Echocardiogram (Echo) TLB-BMP (Basic Metabolic Panel-BMET) (99991111)  Patient Instructions: 1)  Your physician recommends that you return for lab work in: bmet today...we will call you with results 2)  Your physician has requested that you have an echocardiogram.  Echocardiography is a painless test that uses sound waves to create images of your heart. It provides your doctor with information about the size and shape of your heart and how well your heart's chambers and valves are working.  This procedure takes approximately one hour. There are no restrictions for this procedure. we will call you with results Prescriptions: METOPROLOL TARTRATE 25 MG TABS (METOPROLOL TARTRATE) 1 tab 2 time per day  #60 x 6   Entered by:   Francetta Found, RN, BSN   Authorized by:   Annye Rusk, MD, [facility]   Signed by:   Francetta Found, RN, BSN on 02/12/2010   Method used:   Electronically to        [facility] [address] [phone]*  (retail)       [address]       [place], [place]  [address]       Ph: [phone] or [phone]       Fax: [phone]   RxID:   [id number]

## 2010-12-24 NOTE — Letter (Signed)
Summary: [facility]   Imported By: Laural Benes 11/17/2010 11:07:54  _____________________________________________________________________  External Attachment:    Type:   Image     Comment:   External Document

## 2011-01-27 ENCOUNTER — Encounter: Payer: Self-pay | Admitting: Internal Medicine

## 2011-01-31 ENCOUNTER — Ambulatory Visit (INDEPENDENT_AMBULATORY_CARE_PROVIDER_SITE_OTHER): Payer: MEDICARE | Admitting: Internal Medicine

## 2011-01-31 ENCOUNTER — Encounter: Payer: Self-pay | Admitting: Internal Medicine

## 2011-01-31 DIAGNOSIS — E785 Hyperlipidemia, unspecified: Secondary | ICD-10-CM

## 2011-01-31 DIAGNOSIS — M069 Rheumatoid arthritis, unspecified: Secondary | ICD-10-CM

## 2011-01-31 DIAGNOSIS — Z0000 Encounter for general adult medical examination without abnormal findings: Secondary | ICD-10-CM

## 2011-01-31 DIAGNOSIS — R609 Edema, unspecified: Secondary | ICD-10-CM

## 2011-01-31 MED ORDER — CLOPIDOGREL BISULFATE 75 MG PO TABS: 75.0000 mg | ORAL_TABLET | Freq: Every day | ORAL | Status: DC

## 2011-01-31 MED ORDER — PANTOPRAZOLE SODIUM 40 MG PO TBEC: 40.0000 mg | DELAYED_RELEASE_TABLET | Freq: Every day | ORAL | Status: DC

## 2011-01-31 MED ORDER — METOPROLOL TARTRATE 25 MG PO TABS: 25.0000 mg | ORAL_TABLET | Freq: Two times a day (BID) | ORAL | Status: DC

## 2011-01-31 NOTE — Patient Instructions (Signed)
Limit your sodium (Salt) intake    It is important that you exercise regularly, at least 20 minutes 3 to 4 times per week.  If you develop chest pain or shortness of breath seek  medical attention.  Cardiology follow  Return in one year for follow-up  Call or return to clinic prn if these symptoms worsen or fail to improve as anticipated.

## 2011-01-31 NOTE — Progress Notes (Signed)
Subjective:    Patient ID: Robert Rowe, male    DOB: 04-04-1918, 77 y.o.   MRN: [id number]  HPI     Medications Added METOPROLOL TARTRATE 25 MG TABS (METOPROLOL TARTRATE) 1/2 tab once daily        Visit Type:  6 mo f/u Primary Provider:  Marletta Lor  MD  CC:  edema/left leg ......sob....denies any cp...pt c/o fatigue though he states he went to [facility] Saturday for [employer]...also states he went to a party yesterday.  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  He is only taking a half of a 25 mg tablet or 12.5 mg of metoprolol once a day. He has not had a flare of his palpitations or PVCs.  He denies any symptoms of TIAs or mini strokes. He continues on Plavix. He has discontinued his statin.  Carotid Dopplers in August of 2000 and showed nonobstructive plaque and antegrade flow in the vertebrals. 2-D echocardiogram showed EF of 60-65% with trivial aortic regurgitation. He did have some mild to moderate LVH.  He's had some peripheral edema which is about the same. He denies orthopnea or PND.  Current Medications (verified):  1)  Folic Acid 1 Mg Tabs (Folic Acid) .... Take 1 Tablet By Mouth Once A Day 2)  Methotrexate 2.5 Mg Tabs (Methotrexate Sodium) .... Take 3 Once A Week 3)  Plavix 75 Mg Tabs (Clopidogrel Bisulfate) .... Take 1 Tablet By Mouth Once A Day 4)  Pantoprazole Sodium 40 Mg Tbec (Pantoprazole Sodium) .Marland Kitchen.. 1 Tab Once Daily 5)  Multivitamins   Tabs (Multiple Vitamin) .Marland Kitchen.. 1 Tab Once Daily 6)  Vitamin C 500 Mg Tabs (Ascorbic Acid) .Marland Kitchen.. 1 Tab Once Daily 7)  Calcium-Carb 600 + D 600-125 Mg-Unit Tabs (Calcium-Vitamin D) .... Take One Daily 8)  Tylenol 325 Mg Tabs (Acetaminophen) .... As Needed 9)  Metoprolol Tartrate 25 Mg Tabs (Metoprolol Tartrate) .... 1/2 Tab Once Daily  Allergies:  1)  ! Sulfa  Past History:  Past Medical History: Last updated: 12/28/2009 CHEST DISCOMFORT  (ICD-786.59) HYPERLIPIDEMIA (ICD-272.4) PEDAL EDEMA (ICD-782.3) TRANSIENT ISCHEMIC ATTACK, HX OF (ICD-V12.50) GERD (ICD-530.81) OSTEOARTHRITIS (ICD-715.90) DEGENERATIVE JOINT DISEASE (ICD-715.90) RHEUMATOID ARTHRITIS (ICD-714.0) BENIGN PROSTATIC HYPERTROPHY (ICD-600.00) history of PMR, 1997 history left central vein thrombosis December 2003 benign PVCs chronic kidney disease (cr 1.62)  Blind OS   Past Surgical History: Last updated: 2009/04/29 Hemorrhoidectomy 1973 Inguinal herniorrhaphy left-sided Dr. Annamaria Rowe right cataract surgery left knee surgery  L shoulder surgery 3-09 Left cataract surgery 12-09  Family History: Last updated: 04/29/2009 father died at age 65  Social History: Last updated: 04/29/2009 Married retired Administrator, [employer] Service  Risk Factors: Smoking Status: quit (04/05/2007)  Review of Systems        negative other than history of present illness   1. Risk factors, based on past  M,S,F history-  The patient has a history of mild nonobstructive coronary artery disease. He has a history of mild dyslipidemia.  2.  Physical activities: remains remarkably active for a 77 year old complaints include mild dyspnea on exertion of several months duration  3.  Depression/mood: no history of depression or mood disorder 4.  Hearing: no major deficits 5.  ADL's: independent in all aspects of daily living 6.  Fall risk: low  7.  Home safety: no problems identified  8.  Height weight, and visual acuity; height and weight stable he has blindness involving the left eye  9.  Counseling: continue following with multiple  consultants including ophthalmology cardiology rheumatology urology  10. Lab orders based on risk factors: and appropriate at this time recent laboratory studies from his rheumatologist reviewed  11. Referral :  followup rheumatology as scheduled as well as ophthalmology 12. Care plan:   The patient has been intolerant of Vytorin we'll challenge with a  different statin  13. Cognitive assessment:  Alert and oriented with normal affect no cognitive dysfunction      Review of Systems  Constitutional: Negative for fever, chills, appetite change and fatigue.  HENT: Negative for hearing loss, ear pain, congestion, sore throat, trouble swallowing, neck stiffness, dental problem, voice change and tinnitus.   Eyes: Negative for pain, discharge and visual disturbance.  Respiratory: Positive for shortness of breath. Negative for cough, chest tightness, wheezing and stridor.   Cardiovascular: Positive for leg swelling. Negative for chest pain and palpitations.  Gastrointestinal: Negative for nausea, vomiting, abdominal pain, diarrhea, constipation, blood in stool and abdominal distention.  Genitourinary: Negative for urgency, hematuria, flank pain, discharge, difficulty urinating and genital sores.  Musculoskeletal: Negative for myalgias, back pain, joint swelling, arthralgias and gait problem.  Skin: Negative for rash.  Neurological: Negative for dizziness, syncope, speech difficulty, weakness, numbness and headaches.  Hematological: Negative for adenopathy. Does not bruise/bleed easily.  Psychiatric/Behavioral: Negative for behavioral problems and dysphoric mood. The patient is not nervous/anxious.        Objective:   Physical Exam  Constitutional: He appears well-developed and well-nourished.  HENT:  Head: Normocephalic and atraumatic.  Right Ear: External ear normal.  Left Ear: External ear normal.  Nose: Nose normal.  Mouth/Throat: Oropharynx is clear and moist.  Eyes: Conjunctivae and EOM are normal. Pupils are equal, round, and reactive to light. No scleral icterus.  Neck: Normal range of motion. Neck supple. No JVD present. No thyromegaly present.  Cardiovascular: Normal rate, regular rhythm and normal heart sounds.  Exam reveals no gallop and no friction rub.   No murmur heard. Pulmonary/Chest: Effort normal and breath sounds  normal. He exhibits no tenderness.        A few crackles at the right base  Abdominal: Soft. Bowel sounds are normal. He exhibits no distension and no mass. There is no tenderness.  Genitourinary: Prostate normal and penis normal.  Musculoskeletal: Normal range of motion. He exhibits no edema and no tenderness.  Lymphadenopathy:    He has no cervical adenopathy.  Neurological: He is alert. He has normal reflexes. No cranial nerve deficit. Coordination normal.  Skin: Skin is warm and dry. No rash noted.  Psychiatric: He has a normal mood and affect. His behavior is normal.          Assessment & Plan:   dyspnea on exertion-  His resting O2 saturation 96-97. We'll check after a brief but brisk walk  unremarkable health maintenance examination  Palpitations stable  Rheumatoid arthritis stable  pedal  edema very mild and chronic

## 2011-03-08 ENCOUNTER — Encounter: Payer: Self-pay | Admitting: Internal Medicine

## 2011-03-11 ENCOUNTER — Encounter: Payer: Self-pay | Admitting: Internal Medicine

## 2011-03-21 ENCOUNTER — Telehealth: Payer: Self-pay

## 2011-03-21 NOTE — Telephone Encounter (Signed)
Requesting samples of cholesterol med given last visit - livalo.  Wife aware samples ready for pic up

## 2011-03-23 ENCOUNTER — Ambulatory Visit (HOSPITAL_COMMUNITY)
Admission: RE | Admit: 2011-03-23 | Discharge: 2011-03-23 | Disposition: A | Discharge status: Home / Self Care | Payer: MEDICARE | Source: Ambulatory Visit | Attending: Cardiology | Admitting: Cardiology

## 2011-03-23 ENCOUNTER — Ambulatory Visit (INDEPENDENT_AMBULATORY_CARE_PROVIDER_SITE_OTHER): Payer: MEDICARE | Admitting: Cardiology

## 2011-03-23 ENCOUNTER — Encounter: Payer: Self-pay | Admitting: Cardiology

## 2011-03-23 ENCOUNTER — Other Ambulatory Visit: Payer: Self-pay | Admitting: Cardiology

## 2011-03-23 VITALS — BP 144/86 | HR 80 | Ht 62.0 in | Wt 151.0 lb

## 2011-03-23 DIAGNOSIS — R059 Cough, unspecified: Secondary | ICD-10-CM | POA: Insufficient documentation

## 2011-03-23 DIAGNOSIS — R0609 Other forms of dyspnea: Secondary | ICD-10-CM | POA: Insufficient documentation

## 2011-03-23 DIAGNOSIS — R0789 Other chest pain: Secondary | ICD-10-CM

## 2011-03-23 DIAGNOSIS — R0989 Other specified symptoms and signs involving the circulatory and respiratory systems: Secondary | ICD-10-CM | POA: Insufficient documentation

## 2011-03-23 DIAGNOSIS — R05 Cough: Secondary | ICD-10-CM | POA: Insufficient documentation

## 2011-03-23 DIAGNOSIS — Z8679 Personal history of other diseases of the circulatory system: Secondary | ICD-10-CM

## 2011-03-23 DIAGNOSIS — R0600 Dyspnea, unspecified: Secondary | ICD-10-CM

## 2011-03-23 DIAGNOSIS — E785 Hyperlipidemia, unspecified: Secondary | ICD-10-CM

## 2011-03-23 DIAGNOSIS — R0602 Shortness of breath: Secondary | ICD-10-CM

## 2011-03-23 MED ORDER — LEVOFLOXACIN 250 MG PO TABS: 250.0000 mg | ORAL_TABLET | Freq: Every day | ORAL | Status: AC

## 2011-03-23 NOTE — Patient Instructions (Addendum)
A chest x-ray takes a picture of the organs and structures inside the chest, including the heart, lungs, and blood vessels. This test can show several things, including, whether the heart is enlarges; whether fluid is building up in the lungs; and whether pacemaker / defibrillator leads are still in place.  Today at [place] has recommended you make the following change in your medication: Levaquin for 10 days Your physician recommends that you schedule a follow-up appointment in: with your primary care provider. Your physician recommends that you schedule a follow-up appointment in:  1 year with Dr. Verl Blalock

## 2011-03-23 NOTE — Assessment & Plan Note (Signed)
I discussed his numbers with him that were recently. I recommended he take the low dose statin that Dr Raliegh Ip recommended, as much as an anti-inflammatory as anything else.

## 2011-03-23 NOTE — Assessment & Plan Note (Signed)
Stable, no change in treatment.

## 2011-03-23 NOTE — Progress Notes (Signed)
   Patient ID: Robert Rowe, male    DOB: 1918/04/24, 77 y.o.   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 He has had this for almost a week. He denies any fever or chills. He has felt weak and dyspneic.   In regard to his chest discomfort, it happens usually after eating and responds to NTG. His EKG is normal today.    Review of Systems  Constitutional: Negative for fever, chills, diaphoresis and unexpected weight change.  All other systems reviewed and are negative.      Physical Exam  Nursing note and vitals reviewed. Constitutional: He is oriented to person, place, and time. He appears well-developed and well-nourished. No distress.  HENT:  Head: Normocephalic and atraumatic.  Eyes: EOM are normal. Pupils are equal, round, and reactive to light.  Neck: Neck supple. No JVD present. No tracheal deviation present. No thyromegaly present.  Cardiovascular: Normal rate, regular rhythm, normal heart sounds and intact distal pulses.   Pulmonary/Chest: Effort normal.       Diffuse rhonchi  Abdominal: Soft. Bowel sounds are normal.  Musculoskeletal: He exhibits no edema.  Neurological: He is alert and oriented to person, place, and time.  Skin: Skin is warm and dry.  Psychiatric: He has a normal mood and affect.

## 2011-03-25 ENCOUNTER — Telehealth: Payer: Self-pay

## 2011-03-25 NOTE — Telephone Encounter (Signed)
Pt called and stated that he has a concern about this medication.  He read the side effects and would like to ask Dr. Burnice Logan about this matter.  Pt would like a return call
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
## 2011-04-05 NOTE — Assessment & Plan Note (Signed)
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   I saw him last on August 07, 2007, at which time he was complaining  of some chest discomfort.  We did a stress test, which showed good  exercise tolerance, no ST-segment changes, EF 74% with no ischemia.   He has had no further real chest pain.  He was bodysurfing this summer  at 77 years of age!   MEDICATIONS:  He is currently taking;  1. Multivitamin.  2. Vitamin C.  3. Calcium.  4. Folic acid 1 g a day.  5. Plavix 75 mg a day.  6. Mobic 7.5 mg a day.  7. Simvastatin 40 mg nightly.  8. Omeprazole 20 mg daily.   PHYSICAL EXAMINATION:  VITAL SIGNS:  His blood pressure today is 122/68,  his pulse is 80 and regular.  His weight is 155.  GENERAL:  He is in no acute distress.  HEENT:  Normal.  NECK:  Carotid upstrokes are equal bilaterally without bruits, no JVD.  Thyroid is not enlarged.  Trachea is midline.  LUNGS:  Clear to auscultation and percussion.  HEART:  Reveals soft S1 and S2.  No murmur, rub, or gallop.  ABDOMEN:  Soft.  Good bowel sounds.  No pulsatile mass.  EXTREMITIES:  No cyanosis, clubbing, or edema.  His pulses are reduced  in his legs.  He has dependent rubor and really a delayed capillary  reflex.  NEURO:  Intact.   EKG is normal.   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  I am little concerned about  some peripheral artery disease based on his exam.  We will check  arterial Dopplers.  If anything, we will get a baseline study.  I will  see him back in 6 months.     Thomas C. Verl Blalock, MD, [facility]  Electronically Signed    TCW/MedQ  DD: 09/17/2008  DT: 09/18/2008  Job #: [id number]

## 2011-04-08 NOTE — Discharge Summary (Signed)
[facility]  Patient:    Robert Rowe, Robert Rowe                        MRN: [id number] Adm. Date:  [id number] Disc. Date: 11/23/00 Attending:  Winfred Burn Dictator:   Richardson Dopp, P.A. CC:         [facility]  Marletta Lor, M.D.   Discharge Summary  DATE OF BIRTH:  1918/11/11  REASON FOR ADMISSION:  Chest pain.  DISCHARGE DIAGNOSES: 1. Hyperlipidemia. 2. Large hiatal hernia. 3. Arthritis. 4. Status post transient ischemic attack in April 2001. 5. Holter monitor December 2001 revealing occasional PVCs and rare PACs. 6. Mild pulmonary hypertension, RVSP 30-40 with trace TR echocardiogram done    April 2001. 7. History of esophageal strictures/spasm, status post prior dilatations.  ADMISSION HISTORY:  Robert Rowe is an 77 year old gentleman seen in the office on November 22, 2000.  His cardiac risk factors notable for well-controlled hyperlipidemia and family history of premature CAD.  He has undergone previous stress Cardiolite testing in April 1999.  His tests were normal with no evidence of ischemia or infarction and a normal LV function.  The patient notes a history of esophageal spasm for which he takes nitroglycerin, which provides relief.  Over the past six months he noted a subtle decrease in exercise tolerance and some associated exertional dyspnea.  However, he did not note any chest pain, pressure or tightness.  On the morning of November 22, 2000 he awoke around 2:30 a.m. to go to the bathroom.  After returning to bed, he developed chest pressure/tightness across his upper chest, which was a totally new symptom for him.  He did not experience any radiation to the neck or jaw or upper extremities.  He denied any associated dyspnea, diaphoresis or nausea.  He was able to find a nitroglycerin tablet that did produce a tingling sensation under his tongue and did immediately ______ his discomfort with total resolution  after about 10-15 minutes.  He also took an aspirin and drank some water.  A 12-lead EKG in the office revealed normal sinus rhythm with rate of 76 beats per minute with normal axis and no ischemic changes.  ALLERGIES:  SULFA.  PHYSICAL EXAMINATION:  GENERAL:  A male in no acute distress.  Weight 165 pounds.  VITAL SIGNS:  Blood pressure 138/88 on the right 140/90 on the left.  NECK:  With intact carotid pulses bilaterally without bruits, no JVD.  LUNGS:  Clear to auscultation bilaterally.  HEART:  Regular rate and rhythm, normal S1, S2.  No murmurs, rubs or gallops.  ABDOMEN:  Soft, nontender.  EXTREMITIES:  Preserved bilateral femoral pulses without bruits.  No edema.  HOSPITAL COURSE:  Patient was admitted for suspected unstable angina pectoris. It was felt that he should undergo exercise Cardiolite testing to rule out coronary disease.  He ruled out for myocardial infarction by enzymes x 3.  On November 23, 2000 he underwent the gaited exercise treadmill Cardiolite.  He exercised 7 minutes and 15 seconds by standard Bruce protocol and stopped secondary to fatigue.  He about 1 mm of upsloping ST segment depression at I, III and AVF at peak exercise.  His heart rate went from 79 to 137 and his blood pressure went from 122/80 to 178/68.  His Cardiolite images were negative.  Therefore, it was felt he was stable enough for discharge to home. He would follow up in one  month with Dr. Velora Heckler unless he had further symptoms.  At which that time, he would undergo coronary angiography to rule out coronary artery disease.  LABORATORIES:  White count 8200, hemoglobin 14.2, hematocrit 41.9, platelet count 260000, INR 1.0, sodium 140, potassium 4, chloride 107, CO2 27, BUN 25, creatinine 1.6, glucose 119, total bilirubin 0.7, alkaline phosphatase ______, SGOT 24, SGPT 23, albumin 3.5, total protein 6.2, calcium 9.1. Total cholesterol 172, triglycerides 129, HDL 39, LDL 107.  TSH  3.231, urinalysis normal.  Cardiac enzymes as noted above negative x 3.  DISCHARGE MEDICATIONS: 1. Zocor 40 mg q.h.s. 2. Prevacid 30 mg q.d.Marland Kitchen 3. Multivitamin q.d. 4. vitamin C q.d. 5. Calcium q.d. 6. Vioxx 25 mg q.d. 7. Coated aspirin 325 mg q.d.  ACTIVITY:  As tolerated.  DIET:  Low-fat, low-sodium diet.  Patient is to call our office with any more problems.  He is to see Dr. Coralie Keens in one month and he is to call our office for an appointment.DD: 11/23/00 TD:  11/23/00 Job: [id number]

## 2011-04-08 NOTE — Assessment & Plan Note (Signed)
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  He was at Dr. Benjamine Mola office of rheumatology recently  and had an EKG done while he was having them.  We did one today when he  came and his telemetry strip shows unifocal PVCs that are monomorphic.  He denies any pre-syncope, syncope, chest pain, dyspnea on exertion.  He  has these most of the time when he is sitting still.  He is still active  and [place] and also plays golf.  He had a  stress Myoview in August 2006 which showed an EF of 67% with no  ischemia.  He has some soft attenuation in the inferior wall.  He had an  echocardiogram March 02, 2000, for questionable stroke which showed an  EF of 45-55% with apical hypokinesia.  There was a question of diastolic  dysfunction. This was ordered by Dr. Rosiland Oz.  His cardiac risk factors  are pertinent for age and hyperlipidemia.  He says that his mother's  cholesterol was 300 and she lived to be close to 75 years old!   His meds are Prevacid 30 mg a day, multi-vitamin daily, vitamin C daily,  calcium daily, aspirin [id number] mg daily, folic acid daily, glucosamine over  the counter b.i.d., Plavix 75 mg daily, Vytorin 10/20, 2 daily, Mobic  2.5 daily.   He has not had recent blood work.   PHYSICAL EXAMINATION:  VITAL SIGNS:  Blood pressure 120/72, pulse 76 and regular.  His EKG is  normal.  Telemetry shows unifocal monomorphic PVCs.  Weight 150.  GENERAL:  He is in no acute distress.  SKIN:  Warm and dry.  NECK:  Carotid upstrokes are equal bilaterally without bruits, there is  no JVD, thyroid not enlarged, trachea  midline.  LUNGS:  Clear.  HEART:  Non-displaced PMI, there is no sustained PMI, normal S1 and S2,  no gallop.  ABDOMEN:  Slightly protuberant, good bowel sounds.  EXTREMITIES:  No cyanosis, clubbing, and edema.  Pulses are present.  NEUROLOGICAL:  Exam is intact.   ASSESSMENT:  [name] is having symptomatic unifocal PVCs.  We need to  rule out any more complex arrhythmias including atrial fibrillation or  non-sustained VT.  We also need to be assured that his LV function is  normal.  I would also like to check a TSH and potassium as well as a  magnesium.  He would like to have his lipids checked.   PLAN:  1. 24 hour Holter monitor with diary.  2. 2D echocardiogram.  3. Fasting lipids, CMP, TSH, magnesium.   I will call him by phone and discuss the results.  I have  made no  changes in his  program.     Marijo Conception. Verl Blalock, MD, [facility]  Electronically Signed    TCW/MedQ  DD: 10/27/2006  DT: 10/27/2006  Job #: [id number]   cc:   Signa Kell, MD, [facility]  Anson [place], M.D.

## 2011-04-08 NOTE — Discharge Summary (Signed)
[place]  Patient:    Robert Rowe, Robert Rowe                        MRN: [id number] Adm. Date:  [id number] Disc. Date: [id number] Attending:  Harlene Ramus                           Discharge Summary  CHIEF COMPLAINT:  Confusion and "flashbacks."  HISTORY OF PRESENT ILLNESS:  Robert Rowe is a 77 year old, right-handed, married, retired physician generally in good health.  About 5:15 p.m. on the day of admission, he began experiencing a feeling of confusion and "flashbacks."  He states that thoughts kept recurring in his mind and he could not get beyond them.  He felt like he was in a dream at one point.  He refers to "deja vu."  He did not appear outwardly abnormal to his family but did tend to repeat himself a bit.  He felt as though his short-term memory was impaired and could not recall his sons phone number at one point.  His symptoms had started clearing within one hour to 90 minutes following the onset.  The patient denies any speech difficulty, visual changes, numbness, weakness, or incoordination.  There is no prior history of CVA/TIA or seizures.  He denies palpitations or chest pain.  The patient did take two baby aspirin on the evening of admission but missed his dose in the morning.  PAST MEDICAL HISTORY:  Positive for elevated cholesterol and hiatal hernia. He is status post five or six hernia surgeries and is status post a noninvasive prostate procedure.  He has also undergone an arthroscopic surgery on his left and bunion surgery.  There is no history of hypertension or diabetes.  CURRENT MEDICATIONS: 1. Prevacid 30 mg p.o. q.d. 2. Zocor 40 mg p.o. q.d. 3. Vioxx 25 mg p.o. q.d. 4. Intermittent aspirin.  ALLERGIES:  He notes an allergy to SULFA medications.  FAMILY HISTORY:  The patients mother is deceased at age 18 and had elevated cholesterol.  Father died at age 75 and also had increased cholesterol.  SOCIAL HISTORY:  The  patient is married and retired in 1991.  He continues to work part-time and enjoys golf.  REVIEW OF SYSTEMS:  Generally as noted above.  He also notes decreased vision in his left eye secondary to old trauma.  PHYSICAL EXAMINATION:  GENERAL:  This is a well-developed, well-nourished, pleasant gentleman in no acute distress.  VITAL SIGNS:  Blood pressure 128/84, pulse of 76 and regular.  Respirations are 16 and temperature 98.2.  CRANIUM:  Normocephalic, atraumatic.  NECK:  Supple and without bruits.  HEART:  Regular rate and rhythm without murmurs.  LUNGS:  Clear to auscultation.  ABDOMEN:  Soft, nontender, without masses or organomegaly.  EXTREMITIES:  Without cyanosis, clubbing, or edema.  NEUROLOGIC:  The patient is alert and oriented x 4.  Speech is normal.  He tends to repeat questions.  He recalls 1/3 objects after five minutes.  He is able to spell "world" forwards and backwards.  The patient is cooperative and follows commands well.  Cranial nerve examination reveals full extraocular movements without nystagmus.  Left pupil is dilated and nonreactive.  Visual fields are full to confrontation.  Funduscopic examination is unremarkable. There is slight downturn of the left corner of the mouth which apparently has been present since dental implants.  Tongue protrudes in the  midline.  Motor testing reveals a negative ___ and RRE.  There is 5/5 strength throughout the upper and lower extremities.  Deep tendon reflexes are 1+ and symmetric throughout.  Plantar responses are neutral bilaterally.  Sensory examination is intact to pinprick, temperature, and vibratory sensation.  Cerebellar testing reveals fair rapid alternating movements with negative Tinels.  Gait is steady.  DIAGNOSTIC STUDIES:  CT scan of the brain obtained is negative by my review.  IMPRESSION ON ADMISSION:  Possible TIA versus a transient global amnesia. Complex partial seizure activity seems less  likely.  PLAN ON ADMISSION:  Cerebrovascular workup and EEG.  The patient will be started on heparin pending these studies.  LABORATORY FINDINGS:  Patients CBC and differential showed a hemoglobin of 15.3 and hematocrit of 43.7.  There were 6.4 thousand white blood cells and 256,000 platelets.  Differential showed 66% neutrophils, 22% lymphocytes, and 6% monocytes.  PT and PTT were 12.9 and 28 seconds, respectively.  PTT was therapeutic on heparin.  Comprehensive metabolic panel showed a BUN of 28 and a creatinine of 1.7 which was borderline elevated.  All other indices were within normal limits.  A 12-lead EKG showed a normal sinus rhythm and was otherwise unremarkable.  Carotid Doppler studies showed mild noncalcific plaque in the right bifurcation and mild calcific plaque in the left bifurcation with no significant ICA stenosis.  Vertebral artery flow was antegrade bilaterally.  MRI of the brain without contrast showed atrophy with mild small-vessel disease type changes and no evidence of acute infarct.  MRI angiography showed mild intracranial arteriosclerotic type changes.  A 2-D echocardiogram shows that the left ventricular ejection fraction was mildly reduced at 45 to 55%.  There was apical hypokinesis and there was some suggestion of impaired left ventricular diastolic relaxation.  Left atrium was was mildly dilated.  The right ventricular systolic pressure was elevated at 30 to 40 mmHg consistent with mild pulmonary hypertension.  The remainder of the study was unremarkable with no obvious embolic source.  HOSPITAL COURSE:  Vital signs remained quite stable during this brief hospitalization.  Blood pressures were within normal ranges.  Patient underwent the above-noted studies without difficulty.  On the morning following admission, the patient was aware of some improvement in his cognitive abilities.  He was able to recall phone numbers and had no new complaints.  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  At that point, 2-D  echocardiogram had not been obtained but was performed subsequently.  The patient was discharged and the results of the echocardiogram were communicated to him subsequently.  EEG was also performed and, although the results are not in the chart at this time, this turned out to be normal.  FINAL DIAGNOSIS:  Transient ischemic attack versus variant of transient global amnesia.  DISCHARGE MEDICATIONS:  The patient will continue on his home medications and was encouraged to take aspirin every day.  DISPOSITION:  Patient was discharged to home with p.r.n. followup in the office.  CONDITION ON DISCHARGE:  Improved. DD:  04/12/00 TD:  04/15/00 Job: [id number]

## 2011-04-08 NOTE — Assessment & Plan Note (Signed)
[facility] OFFICE NOTE   TAKASHI, ATALLAH                        MRN:          [id number]  DATE:06/05/2006                            DOB:          1918/09/23    Patient 77 year old gentleman who was stable until about 1:30 a.m. today.  He experienced about 15-20 minutes of squeezing chest discomfort.  This  finally resolved after 20 minutes and use of nitroglycerin that was  outdated.  He does have a history of a large hiatal hernia with stricture  formation and is on chronic PPI.  He has had history of esophageal spasm and  these issues in the past.  He was hospitalized for a possible ACS in 2002  and a Cardiolite stress test was negative at that time.  Subsequently he has  had negative stress tests in 2003 and approximately 11 months ago.  He  remains quite active and has had no exertional chest pain.  Examination  today revealed the blood pressure to be 120/72.  Head and neck unremarkable.  Neck:  No bruits.  No neck vein distention.  Chest was clear.  Cardiovascular examination revealed a regular rhythm.  No murmurs.  Abdomen  benign.  There was no epigastric tenderness.  Electrocardiogram was  reviewed.  This revealed normal sinus rhythm with a rate of 82 and was a  normal tracing.   IMPRESSION:  Chest pain syndrome.  Doubt cardiac.   DISPOSITION:  The patient will have some cardiac markers performed now.  He  will report any further chest pain.  If this were to recur has been  instructed to go straight to the hospital and will consider a heart  catheterization.  It is felt most likely this is GI in origin.                                   Marletta Lor, MD   PFK/MedQ  DD:  06/05/2006  DT:  06/05/2006  Job #:  [id number]

## 2011-04-15 ENCOUNTER — Other Ambulatory Visit: Payer: Self-pay | Admitting: *Deleted

## 2011-04-15 MED ORDER — METOPROLOL TARTRATE 25 MG PO TABS: ORAL_TABLET | ORAL | Status: DC

## 2011-07-13 ENCOUNTER — Telehealth: Payer: Self-pay | Admitting: Internal Medicine

## 2011-07-13 NOTE — Telephone Encounter (Signed)
Pt called 8/22. Stated that the former Dr. Deno Lunger office sent his labs from last week (done there) over to Dr. Raliegh Ip. He wants to know if we received them. Would also like to discuss them with Dr. Raliegh Ip and get some advice on them. He will be leaving town for 2+wks. Please call when able.

## 2011-07-13 NOTE — Telephone Encounter (Signed)
Call r/t labs

## 2011-07-28 ENCOUNTER — Telehealth: Payer: Self-pay | Admitting: Internal Medicine

## 2011-07-28 MED ORDER — LEVOTHYROXINE SODIUM 25 MCG PO TABS: 25.0000 ug | ORAL_TABLET | Freq: Every day | ORAL | Status: DC

## 2011-07-28 NOTE — Telephone Encounter (Signed)
done

## 2011-07-28 NOTE — Telephone Encounter (Signed)
Please call in a prescription for Synthroid 25 mcg daily. #90

## 2011-07-28 NOTE — Telephone Encounter (Signed)
Patient would like for Dr Burnice Logan to return his call about getting a referral. Patient refused to leave any additional info regarding this matter. He stated that he is going out of town today. Thanks.

## 2011-08-15 LAB — POCT HEMOGLOBIN-HEMACUE: Hemoglobin: 14.9

## 2011-12-05 ENCOUNTER — Ambulatory Visit (INDEPENDENT_AMBULATORY_CARE_PROVIDER_SITE_OTHER): Payer: Medicare Other | Admitting: Internal Medicine

## 2011-12-05 ENCOUNTER — Encounter: Payer: Self-pay | Admitting: Internal Medicine

## 2011-12-05 ENCOUNTER — Ambulatory Visit (INDEPENDENT_AMBULATORY_CARE_PROVIDER_SITE_OTHER)
Admission: RE | Admit: 2011-12-05 | Discharge: 2011-12-05 | Disposition: A | Discharge status: Home / Self Care | Payer: Medicare Other | Source: Ambulatory Visit | Attending: Internal Medicine | Admitting: Internal Medicine

## 2011-12-05 ENCOUNTER — Telehealth: Payer: Self-pay | Admitting: Internal Medicine

## 2011-12-05 DIAGNOSIS — J069 Acute upper respiratory infection, unspecified: Secondary | ICD-10-CM

## 2011-12-05 DIAGNOSIS — R0989 Other specified symptoms and signs involving the circulatory and respiratory systems: Secondary | ICD-10-CM

## 2011-12-05 DIAGNOSIS — R0600 Dyspnea, unspecified: Secondary | ICD-10-CM

## 2011-12-05 DIAGNOSIS — Z8679 Personal history of other diseases of the circulatory system: Secondary | ICD-10-CM

## 2011-12-05 DIAGNOSIS — R0609 Other forms of dyspnea: Secondary | ICD-10-CM

## 2011-12-05 NOTE — Patient Instructions (Signed)
Chest x-ray as discussed.. Get plenty of rest, Drink lots of  clear liquids, and use Tylenol or ibuprofen for fever and discomfort.    Call or return to clinic prn if these symptoms worsen or fail to improve as anticipated.

## 2011-12-05 NOTE — Telephone Encounter (Signed)
Saw Dr Raliegh Ip this morning and had an xray done. Requesting the results. He would like a call back from Dr Raliegh Ip. Thanks.

## 2011-12-05 NOTE — Progress Notes (Signed)
  Subjective:    Patient ID: Robert Rowe, male    DOB: [birth year], 78 y.o.   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 He developed URI symptoms and took a Z-Pak in late November. More recently he has developed increasing chest congestion cough and malaise cough has been productive of green sputum.  He completed a second Z-Pak today and has felt improved. There's been no fever shortness of breath wheezing or chest pain. He was concerned about possible pneumonia.    Review of Systems  Constitutional: Positive for fatigue. Negative for fever.  HENT: Positive for voice change.   Respiratory: Positive for cough. Negative for chest tightness, shortness of breath and wheezing.        Objective:   Physical Exam  Constitutional: He is oriented to person, place, and time. He appears well-developed and well-nourished. No distress.  HENT:  Head: Normocephalic.  Right Ear: External ear normal.  Left Ear: External ear normal.  Eyes: Conjunctivae and EOM are normal.  Neck: Normal range of motion.  Cardiovascular: Normal rate, regular rhythm and normal heart sounds.   Pulmonary/Chest: Effort normal. No respiratory distress. He has no wheezes.       Few upper airway rhonchi Oxygen  saturation 97% Pulse rate 95  Abdominal: Bowel sounds are normal.  Musculoskeletal: Normal range of motion. He exhibits no edema and no tenderness.  Neurological: He is alert and oriented to person, place, and time.  Psychiatric: He has a normal mood and affect. His behavior is normal.          Assessment & Plan:   Upper respiratory tract infection.  Patient has completed a Z-Pak earlier today and he does feel improved. We'll check a chest x-ray. We'll continue symptomatic and supportive measures. Will call if he develops fever shortness of breath or worsening productive cough

## 2012-01-02 ENCOUNTER — Emergency Department (HOSPITAL_COMMUNITY)
Admission: EM | Admit: 2012-01-02 | Discharge: 2012-01-02 | Disposition: A | Discharge status: Home / Self Care | Payer: Medicare Other | Attending: Emergency Medicine | Admitting: Emergency Medicine

## 2012-01-02 ENCOUNTER — Encounter (HOSPITAL_COMMUNITY): Payer: Self-pay | Admitting: Nurse Practitioner

## 2012-01-02 ENCOUNTER — Other Ambulatory Visit: Payer: Self-pay

## 2012-01-02 ENCOUNTER — Emergency Department (HOSPITAL_COMMUNITY): Payer: Medicare Other

## 2012-01-02 DIAGNOSIS — G459 Transient cerebral ischemic attack, unspecified: Secondary | ICD-10-CM | POA: Insufficient documentation

## 2012-01-02 DIAGNOSIS — Z8673 Personal history of transient ischemic attack (TIA), and cerebral infarction without residual deficits: Secondary | ICD-10-CM | POA: Insufficient documentation

## 2012-01-02 DIAGNOSIS — N189 Chronic kidney disease, unspecified: Secondary | ICD-10-CM | POA: Insufficient documentation

## 2012-01-02 DIAGNOSIS — R4182 Altered mental status, unspecified: Secondary | ICD-10-CM | POA: Insufficient documentation

## 2012-01-02 DIAGNOSIS — E785 Hyperlipidemia, unspecified: Secondary | ICD-10-CM | POA: Insufficient documentation

## 2012-01-02 DIAGNOSIS — F29 Unspecified psychosis not due to a substance or known physiological condition: Secondary | ICD-10-CM | POA: Insufficient documentation

## 2012-01-02 DIAGNOSIS — K219 Gastro-esophageal reflux disease without esophagitis: Secondary | ICD-10-CM | POA: Insufficient documentation

## 2012-01-02 DIAGNOSIS — G319 Degenerative disease of nervous system, unspecified: Secondary | ICD-10-CM | POA: Insufficient documentation

## 2012-01-02 LAB — POCT I-STAT TROPONIN I: Troponin i, poc: 0.01 ng/mL (ref 0.00–0.08)

## 2012-01-02 LAB — CBC
HCT: 44 % (ref 39.0–52.0)
Hemoglobin: 14.5 g/dL (ref 13.0–17.0)
MCH: 32.8 pg (ref 26.0–34.0)
MCHC: 33 g/dL (ref 30.0–36.0)
MCV: 99.5 fL (ref 78.0–100.0)
Platelets: 166 10*3/uL (ref 150–400)
RBC: 4.42 MIL/uL (ref 4.22–5.81)
RDW: 13.7 % (ref 11.5–15.5)
WBC: 6.5 10*3/uL (ref 4.0–10.5)

## 2012-01-02 LAB — BASIC METABOLIC PANEL
BUN: 20 mg/dL (ref 6–23)
CO2: 29 mEq/L (ref 19–32)
Calcium: 9.7 mg/dL (ref 8.4–10.5)
Chloride: 102 mEq/L (ref 96–112)
Creatinine, Ser: 1.31 mg/dL (ref 0.50–1.35)
GFR calc Af Amer: 53 mL/min — ABNORMAL LOW (ref >90)
GFR calc non Af Amer: 46 mL/min — ABNORMAL LOW (ref >90)
Glucose, Bld: 113 mg/dL — ABNORMAL HIGH (ref 70–99)
Potassium: 4.7 mEq/L (ref 3.5–5.1)
Sodium: 139 mEq/L (ref 135–145)

## 2012-01-02 LAB — DIFFERENTIAL
Basophils Absolute: 0 10*3/uL (ref 0.0–0.1)
Basophils Relative: 1 % (ref 0–1)
Eosinophils Absolute: 0.1 10*3/uL (ref 0.0–0.7)
Eosinophils Relative: 2 % (ref 0–5)
Lymphocytes Relative: 24 % (ref 12–46)
Lymphs Abs: 1.6 10*3/uL (ref 0.7–4.0)
Monocytes Absolute: 0.6 10*3/uL (ref 0.1–1.0)
Monocytes Relative: 9 % (ref 3–12)
Neutro Abs: 4.3 10*3/uL (ref 1.7–7.7)
Neutrophils Relative %: 66 % (ref 43–77)

## 2012-01-02 LAB — GLUCOSE, CAPILLARY: Glucose-Capillary: 134 mg/dL — ABNORMAL HIGH (ref 70–99)

## 2012-01-02 NOTE — ED Notes (Signed)
States he was eating lunch today about 1 hour ago and noticed that he was confused to details, "could not remember certain things." On assessment pt is A&Ox4, ambulatory, no unilateral weakness and speech is clear. No pain. Reports a similar episode 10 days ago that resolved. Pt is taking plavix for history of TIA but denies ever having a stroke.

## 2012-01-02 NOTE — ED Provider Notes (Signed)
History     CSN: [id number]  Arrival date & time 01/02/12  1427   First MD Initiated Contact with Patient 01/02/12 1613    Chief Complaint  Patient presents with  . Altered Mental Status     Patient is a 78 y.o. male presenting with altered mental status. The history is provided by the patient and a relative.  Altered Mental Status This is a new problem. The current episode started 1 to 2 hours ago. The problem occurs rarely. The problem has been rapidly improving. Pertinent negatives include no chest pain, no abdominal pain, no headaches and no shortness of breath. The symptoms are aggravated by nothing. The symptoms are relieved by nothing.  Pt reports episode of confusion earlier today that lasted at least one hour.  He had no focal weakness/fall/headache/visual change/cp/sob He is now back to baseline He reports h/o similar episode about 11 days ago that resolved He has f/u with neurologist tomorrow for this He is taking plavix daily for TIA several yrs ago that was very similar to today's episode He had otherwise been feeling well recently, no recent fevers or illnessess Denies dysuria/abd pain/back pain   Past Medical History  Diagnosis Date  . BENIGN PROSTATIC HYPERTROPHY 04/05/2007  . CHEST DISCOMFORT 04/25/2009  . GERD 12/03/2007  . HYPERLIPIDEMIA 04/05/2007  . Osteoarth NOS-Unspec 04/05/2007  . Palpitations 02/12/2010  . PEDAL EDEMA 05/01/2008  . Rheumatoid arthritis 04/05/2007  . TRANSIENT ISCHEMIC ATTACK, HX OF 04/05/2007  . DJD (degenerative joint disease)   . Chronic kidney disease     chronic     Past Surgical History  Procedure Date  . Hemorrhoid surgery   . Hernia repair     inguinal  . Cataract extraction   . Knee surgery     left  . Shoulder surgery     left    History reviewed. No pertinent family history.  History  Substance Use Topics  . Smoking status: Former Smoker    Quit date: 11/21/1952  . Smokeless tobacco: Never Used  . Alcohol Use: 0.6  oz/week    1 Shots of liquor per week      Review of Systems  Respiratory: Negative for shortness of breath.   Cardiovascular: Negative for chest pain.  Gastrointestinal: Negative for abdominal pain.  Neurological: Negative for headaches.  Psychiatric/Behavioral: Positive for altered mental status.  All other systems reviewed and are negative.    Allergies  Sulfonamide derivatives  Home Medications   Current Outpatient Rx  Name Route Sig Dispense Refill  . CLOPIDOGREL BISULFATE 75 MG PO TABS Oral Take 1 tablet (75 mg total) by mouth daily. 90 tablet 6  . FOLIC ACID 1 MG PO TABS Oral Take 1 mg by mouth daily.      Marland Kitchen LEVOTHYROXINE SODIUM 25 MCG PO TABS Oral Take 1 tablet (25 mcg total) by mouth daily. 90 tablet 3  . MELOXICAM 15 MG PO TABS Oral Take 7.5 mg by mouth daily.      Marland Kitchen METHOTREXATE 2.5 MG PO TABS Oral Take 10 mg by mouth once a week. On Mondays; Caution:Chemotherapy. Protect from light. 4 tablets weekly    . ONE-DAILY MULTI VITAMINS PO TABS Oral Take 1 tablet by mouth daily.      Marland Kitchen PANTOPRAZOLE SODIUM 40 MG PO TBEC Oral Take 1 tablet (40 mg total) by mouth daily. 90 tablet 6    BP 166/76  Pulse 86  Temp(Src) 97.6 F (36.4 C) (Oral)  Resp 16  Ht  5\' 3"  (1.6 m)  Wt 148 lb (67.132 kg)  BMI 26.22 kg/m2  SpO2 96%  Physical Exam CONSTITUTIONAL: Well developed/well nourished, appears younger than stated age HEAD AND FACE: Normocephalic/atraumatic EYES: EOMI ENMT: Mucous membranes moist NECK: supple no meningeal signs, no bruits noted SPINE:entire spine nontender CV: S1/S2 noted, no murmurs/rubs/gallops noted LUNGS: Lungs are clear to auscultation bilaterally, no apparent distress ABDOMEN: soft, nontender, no rebound or guarding GU:no cva tenderness NEURO: Pt is awake/alert, moves all extremitiesx4 Normal speech No facial weakness No arm/leg drift Gait is normal No focal sensory deficit appreciated Awake/alert/oriented x3 He is appropriate  EXTREMITIES:  pulses normal, full ROM SKIN: warm, color normal PSYCH: no abnormalities of mood noted  ED Course  Procedures   Labs Reviewed  GLUCOSE, CAPILLARY - Abnormal; Notable for the following:    Glucose-Capillary 134 (*)    All other components within normal limits  CBC  DIFFERENTIAL  BASIC METABOLIC PANEL   Ct Head Wo Contrast  01/02/2012  *RADIOLOGY REPORT*  Clinical Data: Altered mental status.  Confusion.  History of TIAs. The patient is on Plavix.  CT HEAD WITHOUT CONTRAST  Technique:  Contiguous axial images were obtained from the base of the skull through the vertex without contrast.  Comparison: None  Findings: There is mild central and cortical atrophy. Periventricular white matter changes are consistent with small vessel disease. There is no evidence for hemorrhage, mass lesion, or acute infarction.  There is atherosclerotic calcification of the internal carotid arteries.  Otherwise, bone windows are unremarkable.  IMPRESSION:  1.  Atrophy and small vessel disease. 2. No evidence for acute intracranial abnormality.  Original Report Authenticated By: Glenice Bow, M.D.   4:36 PM I told patient that he likely had a TIA I advised admission However, he reports he has f/u with neurology tomorrow and he understands risk of leaving (catastrophic stroke that can follow TIA within 48 hrs) He reports he accepts these risks  5:39 PM I discussed risk of death/disability of leaving against medical advice and the patient accepts these risks.  The patient is awake/alet able to make decisions, and not intoxicated He will see his neurologist tomorrow He understands to return at anytime Son at bedside, he heard this conversation      MDM  Nursing notes reviewed and considered in documentation Previous records reviewed and considered All labs/vitals reviewed and considered        Date: 01/02/2012  Rate: 74  Rhythm: normal sinus rhythm  QRS Axis: normal  Intervals: normal  ST/T  Wave abnormalities: nonspecific ST changes  Conduction Disutrbances:none  Narrative Interpretation:   Old EKG Reviewed: unchanged    Sharyon Cable, MD 01/02/12 1740

## 2012-01-03 ENCOUNTER — Encounter: Payer: Self-pay | Admitting: Neurology

## 2012-01-03 ENCOUNTER — Ambulatory Visit (INDEPENDENT_AMBULATORY_CARE_PROVIDER_SITE_OTHER): Payer: Medicare Other | Admitting: Neurology

## 2012-01-03 VITALS — BP 130/78 | HR 96 | Ht 63.0 in | Wt 152.0 lb

## 2012-01-03 DIAGNOSIS — G459 Transient cerebral ischemic attack, unspecified: Secondary | ICD-10-CM

## 2012-01-03 NOTE — Patient Instructions (Signed)
Your MRI is scheduled for Monday, Feb 18th at 2:00pm.  Please arrive to [facility], first floor admitting by 1:45pm.  [phone].  We will call you for your appointment for the EEG it will also be done at [facility].

## 2012-01-03 NOTE — Progress Notes (Signed)
Dear Dr. Burnice Logan,  Thank you for having me see Robert Rowe in consultation today at [facility] for his problem with recurrent spells of confusion.  As you may recall, he is a 78 y.o. year old male with a history of rheumatoid arthritis who has had at least 4 spells of "confusion" lasting hours, with two having occurred in the last 10 days.  He describes these spells as the sensation of "ideas repeating over and over in his brain".  He had is initial spell in 2001 when he had an MRI brain that did not point to any obvious etiology as well as a normal carotid doppler.  He has another spell in 2004 when he had a normal EEG.  He says with these spells he cannot feel them coming on.  They are not clearly precipitated by anything.  He slowly returns to normal.  He says blood glucoses have been checked during these spells as well as BP and they were not low or high.  He does have a history of "hypoglycemia" but says this feels different then these spells.  There is no accompanying headache.  He says that you would not be able to tell if he was having one of the spells if he had one in front of you.  With one of the spells he may have had weakness of his right arm.  He has been on clopidogrel for years for these spells but is unsure it helps.  He cannot endorse any precipitating factors.  A recent CT head showed atrophy and small vessel disease.  Past Medical History  Diagnosis Date  . BENIGN PROSTATIC HYPERTROPHY 04/05/2007  . CHEST DISCOMFORT 04/25/2009  . GERD 12/03/2007  . HYPERLIPIDEMIA 04/05/2007  . Osteoarth NOS-Unspec 04/05/2007  . Palpitations 02/12/2010  . PEDAL EDEMA 05/01/2008  . Rheumatoid arthritis 04/05/2007  . TRANSIENT ISCHEMIC ATTACK, HX OF 04/05/2007  . DJD (degenerative joint disease)   . Chronic kidney disease     chronic   - no history of seizures or strokes. - no history of liver disease.  Past Surgical History  Procedure Date  . Hemorrhoid surgery   . Hernia repair       inguinal x5  . Cataract extraction   . Knee surgery     left  . Shoulder surgery     left    History   Social History  . Marital Status: Married    Spouse Name: N/A    Number of Children: N/A  . Years of Education: N/A   Social History Main Topics  . Smoking status: Former Smoker    Quit date: 11/21/1952  . Smokeless tobacco: Never Used  . Alcohol Use: 0.6 oz/week    1 Shots of liquor per week  . Drug Use: No  . Sexually Active: None   Other Topics Concern  . None   Social History Narrative  . None    Family History:  no history of seizures.  Current Outpatient Prescriptions on File Prior to Visit  Medication Sig Dispense Refill  . clopidogrel (PLAVIX) 75 MG tablet Take 1 tablet (75 mg total) by mouth daily.  90 tablet  6  . folic acid (FOLVITE) 1 MG tablet Take 1 mg by mouth daily.        Marland Kitchen levothyroxine (LEVOTHROID) 25 MCG tablet Take 1 tablet (25 mcg total) by mouth daily.  90 tablet  3  . meloxicam (MOBIC) 15 MG tablet Take 7.5 mg by mouth  daily.        . methotrexate (RHEUMATREX) 2.5 MG tablet Take 10 mg by mouth once a week. On Mondays; Caution:Chemotherapy. Protect from light. 4 tablets weekly      . Multiple Vitamin (MULTIVITAMIN) tablet Take 1 tablet by mouth daily.        . pantoprazole (PROTONIX) 40 MG tablet Take 1 tablet (40 mg total) by mouth daily.  90 tablet  6    Allergies  Allergen Reactions  . Sulfonamide Derivatives       ROS:  13 systems were reviewed and are notable for arthritis.  Worsening balance.  All other review of systems are unremarkable.   Examination:  Filed Vitals:   01/03/12 1148  BP: 130/78  Pulse: 96  Height: 5\' 3"  (1.6 m)  Weight: 152 lb (68.947 kg)     In general, well appearing older man.  Cardiovascular: The patient has a regular rate and rhythm and no carotid bruits.  Fundoscopy:  Disks are flat. Vessel caliber within normal limits.  Mental status:   The patient is oriented to person, place and  time. Recent and remote memory are intact. Attention span and concentration are normal. Language including repetition, naming, following commands are intact. Fund of knowledge of current and historical events, as well as vocabulary are normal.  Cranial Nerves: Pupils are equally round and reactive to light. Visual fields full to confrontation. Extraocular movements are intact without nystagmus. Facial sensation and muscles of mastication are intact. Muscles of facial expression are symmetric. Hearing intact to bilateral finger rub. Tongue protrusion, uvula, palate midline.  Shoulder shrug intact  Motor:  The patient has normal bulk and tone, no pronator drift.  There are no adventitious movements.  5/5 muscle strength bilaterally.  Reflexes:  Symmetric  Toes down  Coordination:  Normal finger to nose.  No dysdiadokinesia.  Sensation is symmetric to temperature and vibration.  Gait and Station are normal.    Romberg is slightly imbalanced.   Impression/Recs: Spells of confusion lasting hours.  I don't think these are TIAs given the lack of focal complaints.  However, we need to get an MRA head and neck and MRI brain.  Also I will get an EEG.  I really don't know what these are.  A metabolic cause is possible as well although one would expect actual manifest confusion.  I think a migraine variant is less likely.   Thank you for having [place] see Robert Rowe in consultation.  Feel free to contact me with any questions.  Kavin Leech Jacelyn Grip, MD [facility], [place] [address], [place] [address] Phone: [phone] Fax: [phone].

## 2012-01-09 ENCOUNTER — Ambulatory Visit (HOSPITAL_COMMUNITY): Admission: RE | Admit: 2012-01-09 | Payer: Medicare Other | Source: Ambulatory Visit

## 2012-01-09 ENCOUNTER — Ambulatory Visit (HOSPITAL_COMMUNITY): Payer: Medicare Other

## 2012-01-09 ENCOUNTER — Ambulatory Visit (HOSPITAL_COMMUNITY)
Admission: RE | Admit: 2012-01-09 | Discharge: 2012-01-09 | Disposition: A | Discharge status: Home / Self Care | Payer: Medicare Other | Source: Ambulatory Visit | Attending: Neurology | Admitting: Neurology

## 2012-01-09 ENCOUNTER — Telehealth: Payer: Self-pay | Admitting: Neurology

## 2012-01-09 DIAGNOSIS — G459 Transient cerebral ischemic attack, unspecified: Secondary | ICD-10-CM

## 2012-01-09 DIAGNOSIS — I6529 Occlusion and stenosis of unspecified carotid artery: Secondary | ICD-10-CM | POA: Insufficient documentation

## 2012-01-09 DIAGNOSIS — G9389 Other specified disorders of brain: Secondary | ICD-10-CM | POA: Insufficient documentation

## 2012-01-09 DIAGNOSIS — I672 Cerebral atherosclerosis: Secondary | ICD-10-CM | POA: Insufficient documentation

## 2012-01-09 DIAGNOSIS — F29 Unspecified psychosis not due to a substance or known physiological condition: Secondary | ICD-10-CM | POA: Insufficient documentation

## 2012-01-09 DIAGNOSIS — I771 Stricture of artery: Secondary | ICD-10-CM | POA: Insufficient documentation

## 2012-01-09 MED ORDER — GADOBENATE DIMEGLUMINE 529 MG/ML IV SOLN
13.0000 mL | Freq: Once | INTRAVENOUS | Status: AC
  Administered 2012-01-09: 13 mL via INTRAVENOUS

## 2012-01-09 NOTE — Telephone Encounter (Signed)
Completed & approved

## 2012-01-10 ENCOUNTER — Ambulatory Visit (HOSPITAL_COMMUNITY)
Admission: RE | Admit: 2012-01-10 | Discharge: 2012-01-10 | Disposition: A | Discharge status: Home / Self Care | Payer: Medicare Other | Source: Ambulatory Visit | Attending: Neurology | Admitting: Neurology

## 2012-01-10 DIAGNOSIS — G459 Transient cerebral ischemic attack, unspecified: Secondary | ICD-10-CM

## 2012-01-10 DIAGNOSIS — F29 Unspecified psychosis not due to a substance or known physiological condition: Secondary | ICD-10-CM | POA: Insufficient documentation

## 2012-01-10 DIAGNOSIS — Z1389 Encounter for screening for other disorder: Secondary | ICD-10-CM | POA: Insufficient documentation

## 2012-01-10 DIAGNOSIS — R569 Unspecified convulsions: Secondary | ICD-10-CM

## 2012-01-12 ENCOUNTER — Telehealth: Payer: Self-pay | Admitting: Neurology

## 2012-01-12 NOTE — Telephone Encounter (Signed)
Spoke with the patient's wife. Info given as per [name] below. No additional questions/concerns at this time.

## 2012-01-12 NOTE — Telephone Encounter (Signed)
Message copied by Angelica Pou on Thu Jan 12, 2012  9:45 AM ------      Message from: Neena Rhymes H      Created: Wed Jan 11, 2012 10:31 PM       Let [name] know that his MRI/MRA looked fine.  No signs of significant atherosclerosis or previous strokes.  Certainly no explanation for his "TIAs"

## 2012-01-13 NOTE — Procedures (Signed)
   This routine EEG was requested in this 78 year old man who has had spells of confusion that last hours.  He is on no anticonvulsant medication.  The EEG was done with the patient awake and drowsy.  During periods of maximal wakefulness, he had a 9 cycle per second posterior dominant rhythm that attenuated with eye opening and was symmetric.  Background activities were composed of low-amplitude alpha as well as frontally dominant beta activities that were symmetric.  Photic stimulation produced a symmetric driving response. Hyperventilation was not performed due to the patient's history of possible cerebrovascular disease.  Much of the EEG was done with the patient drowsy.  Drowsiness was marked by attenuation of the alpha rhythm as well as muscle activities.  There was onset of slow roving eye movements and bursts of slower symmetric delta and theta activities.  Vertex sharp waves were seen that were symmetric, but there was no clear sleep spindles.  EKG revealed a normal sinus rhythm.  CLINICAL INTERPRETATION:  This routine EEG done with the patient awake and drowsy is normal.          ______________________________ Neena Rhymes, MD    [id number] D:  01/12/2012 13:19:34  T:  01/13/2012 02:41:56  Job #:  [id number]

## 2012-02-02 ENCOUNTER — Encounter: Payer: Self-pay | Admitting: Internal Medicine

## 2012-02-02 ENCOUNTER — Ambulatory Visit (INDEPENDENT_AMBULATORY_CARE_PROVIDER_SITE_OTHER): Payer: Medicare Other | Admitting: Internal Medicine

## 2012-02-02 VITALS — BP 120/80 | HR 94 | Temp 97.5°F | Wt 153.0 lb

## 2012-02-02 DIAGNOSIS — M069 Rheumatoid arthritis, unspecified: Secondary | ICD-10-CM

## 2012-02-02 DIAGNOSIS — E785 Hyperlipidemia, unspecified: Secondary | ICD-10-CM

## 2012-02-02 DIAGNOSIS — R002 Palpitations: Secondary | ICD-10-CM

## 2012-02-02 DIAGNOSIS — Z0000 Encounter for general adult medical examination without abnormal findings: Secondary | ICD-10-CM

## 2012-02-02 DIAGNOSIS — N400 Enlarged prostate without lower urinary tract symptoms: Secondary | ICD-10-CM

## 2012-02-02 MED ORDER — CLOPIDOGREL BISULFATE 75 MG PO TABS: 75.0000 mg | ORAL_TABLET | Freq: Every day | ORAL | Status: DC

## 2012-02-02 MED ORDER — LEVOTHYROXINE SODIUM 25 MCG PO TABS: 25.0000 ug | ORAL_TABLET | Freq: Every day | ORAL | Status: DC

## 2012-02-02 MED ORDER — MELOXICAM 15 MG PO TABS: 7.5000 mg | ORAL_TABLET | Freq: Every day | ORAL | Status: DC

## 2012-02-02 MED ORDER — PANTOPRAZOLE SODIUM 40 MG PO TBEC: 40.0000 mg | DELAYED_RELEASE_TABLET | Freq: Every day | ORAL | Status: DC

## 2012-02-02 NOTE — Patient Instructions (Signed)
Limit your sodium (Salt) intake    It is important that you exercise regularly, at least 20 minutes 3 to 4 times per week.  If you develop chest pain or shortness of breath seek  medical attention.  You need to lose weight.  Consider a lower calorie diet and regular exercise.  Return in one year for follow-up 

## 2012-02-02 NOTE — Progress Notes (Signed)
Subjective:    Patient ID: Robert Rowe, male    DOB: [birth year], 78 y.o.   MRN: [id number]  HPI  78 year old patient who is seen today for a wellness exam. He does remarkably well but does feel that over the past year or so he has lost some of his stamina. He has had a recent neurology evaluation  Due to  an episode of confusion. This was nonrevealing he is followed by ophthalmology and  urologyas well as rheumatology.  He apparently was placed on a medicine recently admitted to urinary urgency by urology  1. Risk factors, based on past  M,S,F history-  cardiovascular risk factors include age and history of mild dyslipidemia  2.  Physical activities: Remains quite active still able to play 9 holes of golf  3.  Depression/mood: No history of depression or mood disorder  4.  Hearing: No major deficits  5.  ADL's: Independent in all aspects of daily living  6.  Fall risk: Moderate due to age and mild gait instability  7.  Home safety: No problems identified  8.  Height weight, and visual acuity; height weight stable chronic decreased visual left eye  9.  Counseling: Heart healthy diet regular exercise modest weight loss following curves  10. Lab orders based on risk factors: We'll check a TSH  11. Referral : Followup rheumatology urology and ophthalmology  12. Care plan: Continue present regimen  13. Cognitive assessment: Alert and oriented with normal affect. No cognitive dysfunction      Review of Systems  Constitutional: Positive for fatigue. Negative for fever, chills and appetite change.  HENT: Negative for hearing loss, ear pain, congestion, sore throat, trouble swallowing, neck stiffness, dental problem, voice change and tinnitus.   Eyes: Negative for pain, discharge and visual disturbance.  Respiratory: Negative for cough, chest tightness, wheezing and stridor.   Cardiovascular: Negative for chest pain, palpitations and leg swelling.  Gastrointestinal: Negative for  nausea, vomiting, abdominal pain, diarrhea, constipation, blood in stool and abdominal distention.  Genitourinary: Positive for urgency. Negative for hematuria, flank pain, discharge, difficulty urinating and genital sores.  Musculoskeletal: Negative for myalgias, back pain, joint swelling, arthralgias and gait problem.  Skin: Negative for rash.  Neurological: Negative for dizziness, syncope, speech difficulty, numbness and headaches.  Hematological: Negative for adenopathy. Does not bruise/bleed easily.  Psychiatric/Behavioral: Negative for behavioral problems and dysphoric mood. The patient is not nervous/anxious.        Objective:   Physical Exam  Constitutional: He is oriented to person, place, and time. He appears well-developed and well-nourished.       Blood pressure well controlled  HENT:  Head: Normocephalic and atraumatic.  Right Ear: External ear normal.  Left Ear: External ear normal.  Nose: Nose normal.  Mouth/Throat: Oropharynx is clear and moist.       Edentulous  Eyes: Conjunctivae and EOM are normal. Pupils are equal, round, and reactive to light. No scleral icterus.  Neck: Normal range of motion. Neck supple. No JVD present. No thyromegaly present.  Cardiovascular: Normal rate, regular rhythm and normal heart sounds.  Exam reveals no gallop and no friction rub.   No murmur heard.      Pedal pulses not easily palpable  Pulmonary/Chest: Effort normal and breath sounds normal. He exhibits no tenderness.  Abdominal: Soft. Bowel sounds are normal. He exhibits no distension and no mass. There is no tenderness.       Mild midline hernia  Genitourinary: Prostate normal and penis normal.  Musculoskeletal: Normal range of motion. He exhibits no edema and no tenderness.  Lymphadenopathy:    He has no cervical adenopathy.  Neurological: He is alert and oriented to person, place, and time. He has normal reflexes. No cranial nerve deficit. Coordination normal.  Skin: Skin is  warm and dry. No rash noted.  Psychiatric: He has a normal mood and affect. His behavior is normal.          Assessment & Plan:   Preventive health examination Continue present regimen which includes Plavix. He has RA and will be followed by rheumatology. We'll check a TSH Medical regimen unchanged

## 2012-02-18 ENCOUNTER — Ambulatory Visit: Payer: Medicare Other

## 2012-02-18 ENCOUNTER — Ambulatory Visit (INDEPENDENT_AMBULATORY_CARE_PROVIDER_SITE_OTHER): Payer: Medicare Other | Admitting: Emergency Medicine

## 2012-02-18 VITALS — BP 144/102 | HR 80 | Temp 98.0°F | Resp 16 | Ht 62.0 in | Wt 152.2 lb

## 2012-02-18 DIAGNOSIS — S60229A Contusion of unspecified hand, initial encounter: Secondary | ICD-10-CM

## 2012-02-18 DIAGNOSIS — S61409A Unspecified open wound of unspecified hand, initial encounter: Secondary | ICD-10-CM

## 2012-02-18 NOTE — Progress Notes (Signed)
  Subjective:    Patient ID: Robert Rowe, male    DOB: 02/24/1918, 78 y.o.   MRN: [id number]  HPI patient was walking up the steps today and tripped on the rocks and fell striking his right hand. He sustained lacerations to the area over the fourth metacarpal and base of the fifth metacarpal    Review of Systems     Objective:   Physical Exam there are skin abrasions present over the fifth metacarpal and also at the base of the fourth metacarpal. There is no significant swelling. These areas were cleaned and the margins approximated with Steri-Strips.   [facility] reading (PRIMARY) by  Dr Everlene Farrier there were degenerative changes diffusely through the hand. There is irregularity of the distal third metacarpal however this was not the site of the injury. No acute fracture identified. .      Assessment & Plan:  Assessment his wound hand with skin erosions. I did not feel the abnormality seen on x-ray was an acute fracture in the knee abnormal area is not in the area of his injury. Contact if any further followup is necessary. His tetanus was up-to-date.

## 2012-02-19 ENCOUNTER — Telehealth: Payer: Self-pay

## 2012-02-19 NOTE — Telephone Encounter (Signed)
Patient wanted to know when he should follow up or if he needs to. When should he take bandage off?

## 2012-02-20 NOTE — Telephone Encounter (Signed)
I called pt and LMOM to check his hand wound daily for redness or drainage.  He should keep it clean and dry and covered with a dressing until the skin is healed.  RTC prn.

## 2012-02-23 ENCOUNTER — Telehealth: Payer: Self-pay | Admitting: Internal Medicine

## 2012-02-23 NOTE — Telephone Encounter (Signed)
Pt called and wants to know if Dr Burnice Logan rcvd pts lab results from [facility]? If so, pls call pt with results.

## 2012-02-23 NOTE — Telephone Encounter (Signed)
I have not seen any - please advise if you have seen and reviewed

## 2012-03-05 ENCOUNTER — Encounter: Payer: Self-pay | Admitting: Neurology

## 2012-03-05 ENCOUNTER — Ambulatory Visit (INDEPENDENT_AMBULATORY_CARE_PROVIDER_SITE_OTHER): Payer: Medicare Other | Admitting: Neurology

## 2012-03-05 VITALS — BP 140/78 | HR 78 | Wt 152.0 lb

## 2012-03-05 DIAGNOSIS — R569 Unspecified convulsions: Secondary | ICD-10-CM

## 2012-03-05 NOTE — Progress Notes (Signed)
Dear Dr. Burnice Logan,  I saw  Robert Rowe back in [facility] for his problem with 4 transient spells of confusion.  As you may recall, he is a 78 y.o. year old male with a history of rheumatoid arthritis, who had two recent spells of confusion lasting hours.  However, these spells are subtle, so that people observing him would not notice the changes.  I did get an EEG and MRI brain, MRA head and neck which were unremarkable except for some  ventriculomegaly on MRI brain.  He also has a history of hypoglycemia, but at his last visit he felt this was a different experience than his spells of "confusion".  He has had no other spells since I saw him last.    Medical history, social history, and family history were reviewed and have not changed since the last clinic visit.  Current Outpatient Prescriptions on File Prior to Visit  Medication Sig Dispense Refill  . clopidogrel (PLAVIX) 75 MG tablet Take 1 tablet (75 mg total) by mouth daily.  90 tablet  6  . folic acid (FOLVITE) 1 MG tablet Take 1 mg by mouth daily.        Marland Kitchen levothyroxine (LEVOTHROID) 25 MCG tablet Take 1 tablet (25 mcg total) by mouth daily.  90 tablet  3  . meloxicam (MOBIC) 15 MG tablet Take 0.5 tablets (7.5 mg total) by mouth daily.  90 tablet  6  . methotrexate (RHEUMATREX) 2.5 MG tablet Take 10 mg by mouth once a week. On Mondays; Caution:Chemotherapy. Protect from light. 4 tablets weekly      . Multiple Vitamin (MULTIVITAMIN) tablet Take 1 tablet by mouth daily.        . pantoprazole (PROTONIX) 40 MG tablet Take 1 tablet (40 mg total) by mouth daily.  90 tablet  6    Allergies  Allergen Reactions  . Sulfonamide Derivatives     ROS:  13 systems were reviewed and are notable for ecchymosis of his right hand - he had a fall where he tripped.  All other review of systems are unremarkable.  Exam: Filed Vitals:   03/05/12 1424  BP: 140/78  Pulse: 78     In general, well appearing man in NAD.  Mental  status:   The patient is oriented to person, place and time. Recent and remote memory are intact. Attention span and concentration are normal. Language including repetition, naming, following commands are intact. Fund of knowledge of current and historical events, as well as vocabulary are normal.  Cranial Nerves: Left pupil dilated unreactive, right reactive.  Visual fields full to confrontation in right eye. Extraocular movements are intact without nystagmus. Muscles of facial expression are symmetric.  Tongue protrusion, uvula, palate midline.  Shoulder shrug intact  Motor: 5/5 muscle strength bilaterally.  Reflexes:  Quiet throughout.    Gait:  Normal gait and station.  Turns well.  Impression/Recommendations:  1.  Spells of "confusion" - unsure as to their cause.  I think it is very unlikely these are TIAs given that he does not have focal symptoms.  It is possible they represent hypoglyemia and I have mentioned getting a glucometer to see if he can get a BG measurement if they recur.  He will call me if he has other spells.  Kavin Leech Jacelyn Grip, MD [facility], [place]

## 2012-03-08 ENCOUNTER — Telehealth: Payer: Self-pay | Admitting: Internal Medicine

## 2012-03-08 NOTE — Telephone Encounter (Signed)
Pt called and is re: tsh lvl. Pt says that tsh was high. Should he increase thyroid med, also wants to talk to nurse about his cholesterol lvl. Should pt go back on cholesterol med.

## 2012-03-08 NOTE — Telephone Encounter (Signed)
I have printed lab for you to review and then advise on meds

## 2012-03-09 ENCOUNTER — Other Ambulatory Visit: Payer: Self-pay | Admitting: Internal Medicine

## 2012-03-09 MED ORDER — LEVOTHYROXINE SODIUM 50 MCG PO TABS: 50.0000 ug | ORAL_TABLET | Freq: Every day | ORAL | Status: DC

## 2012-03-09 MED ORDER — ROSUVASTATIN CALCIUM 5 MG PO TABS: 5.0000 mg | ORAL_TABLET | Freq: Every day | ORAL | Status: DC

## 2012-03-28 ENCOUNTER — Ambulatory Visit (INDEPENDENT_AMBULATORY_CARE_PROVIDER_SITE_OTHER): Payer: Medicare Other | Admitting: Cardiology

## 2012-03-28 ENCOUNTER — Encounter: Payer: Self-pay | Admitting: Cardiology

## 2012-03-28 VITALS — BP 124/68 | HR 88 | Ht 62.0 in | Wt 153.0 lb

## 2012-03-28 DIAGNOSIS — E785 Hyperlipidemia, unspecified: Secondary | ICD-10-CM

## 2012-03-28 DIAGNOSIS — R002 Palpitations: Secondary | ICD-10-CM

## 2012-03-28 DIAGNOSIS — Z8679 Personal history of other diseases of the circulatory system: Secondary | ICD-10-CM

## 2012-03-28 DIAGNOSIS — R609 Edema, unspecified: Secondary | ICD-10-CM

## 2012-03-28 DIAGNOSIS — R0789 Other chest pain: Secondary | ICD-10-CM

## 2012-03-28 DIAGNOSIS — R0600 Dyspnea, unspecified: Secondary | ICD-10-CM

## 2012-03-28 NOTE — Assessment & Plan Note (Signed)
He and I both agree that quality of life at this point is more important. He will remain off of his statin.

## 2012-03-28 NOTE — Assessment & Plan Note (Signed)
This does not sound cardiac. Encouraged to still take nitroglycerin for relief which is probably esophageal spasm.

## 2012-03-28 NOTE — Progress Notes (Signed)
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  TIA in January. He had an extensive workup which was negative. He continues on Plavix.  He denies any exertional chest pain. He still plays golf. He has chest discomfort at night only when he has spicy food and has an alcoholic beverage. It is relieved with nitroglycerin.  He stopped taking his statin because he felt so bad. He says at 89 why should he bother.  Past Medical History  Diagnosis Date  . BENIGN PROSTATIC HYPERTROPHY 04/05/2007  . CHEST DISCOMFORT 04/25/2009  . GERD 12/03/2007  . HYPERLIPIDEMIA 04/05/2007  . Osteoarth NOS-Unspec 04/05/2007  . Palpitations 02/12/2010  . PEDAL EDEMA 05/01/2008  . Rheumatoid arthritis 04/05/2007  . TRANSIENT ISCHEMIC ATTACK, HX OF 04/05/2007  . DJD (degenerative joint disease)   . Chronic kidney disease     chronic     Current Outpatient Prescriptions  Medication Sig Dispense Refill  . clopidogrel (PLAVIX) 75 MG tablet Take 1 tablet (75 mg total) by mouth daily.  90 tablet  6  . folic acid (FOLVITE) 1 MG tablet Take 1 mg by mouth daily.        Marland Kitchen levothyroxine (SYNTHROID, LEVOTHROID) 50 MCG tablet Take 1 tablet (50 mcg total) by mouth daily.  90 tablet  3  . meloxicam (MOBIC) 15 MG tablet Take 0.5 tablets (7.5 mg total) by mouth daily.  90 tablet  6  . methotrexate (RHEUMATREX) 2.5 MG tablet Take 10 mg by mouth once a week. On Mondays; Caution:Chemotherapy. Protect from light. 4 tablets weekly      . Multiple Vitamin (MULTIVITAMIN) tablet Take 1 tablet by mouth daily.        . nitroGLYCERIN (NITROSTAT) 0.4 MG SL tablet Place 0.4 mg under the tongue every 5 (five) minutes as needed.      . pantoprazole (PROTONIX) 40 MG tablet Take 1 tablet (40 mg total) by mouth daily.  90 tablet  6    Allergies  Allergen Reactions  . Sulfonamide Derivatives     No family history on file.  History   Social History  . Marital  Status: Married    Spouse Name: N/A    Number of Children: N/A  . Years of Education: N/A   Occupational History  . Not on file.   Social History Main Topics  . Smoking status: Former Smoker    Quit date: 08/13/1953  . Smokeless tobacco: Never Used  . Alcohol Use: 0.6 oz/week    1 Shots of liquor per week  . Drug Use: No  . Sexually Active: Not on file   Other Topics Concern  . Not on file   Social History Narrative  . No narrative on file    ROS ALL NEGATIVE EXCEPT THOSE NOTED IN HPI  PE  General Appearance: well developed, well nourished in no acute distress HEENT: symmetrical face, PERRLA, good dentition  Neck: no JVD, thyromegaly, or adenopathy, trachea midline Chest: symmetric without deformity Cardiac: PMI non-displaced, RRR, normal S1, S2, no gallop or murmur Lung: clear to ausculation and percussion Vascular: all pulses full without bruits  Abdominal: nondistended, nontender, good bowel sounds, no HSM, no bruits Extremities: no cyanosis, clubbing or edema, no sign of DVT, no varicosities  Skin: normal color, no rashes Neuro: alert and oriented x 3, non-focal Pysch: normal affect  EKG  BMET    Component Value Date/Time   NA 139 01/02/2012 1602  K 4.7 01/02/2012 1602   CL 102 01/02/2012 1602   CO2 29 01/02/2012 1602   GLUCOSE 113* 01/02/2012 1602   BUN 20 01/02/2012 1602   CREATININE 1.31 01/02/2012 1602   CALCIUM 9.7 01/02/2012 1602   GFRNONAA 46* 01/02/2012 1602   GFRAA 53* 01/02/2012 1602    Lipid Panel     Component Value Date/Time   CHOL 140 12/28/2009 0931   TRIG 69.0 12/28/2009 0931   HDL 50.70 12/28/2009 0931   CHOLHDL 3 12/28/2009 0931   VLDL 13.8 12/28/2009 0931   LDLCALC 76 12/28/2009 0931    CBC    Component Value Date/Time   WBC 6.5 01/02/2012 1602   RBC 4.42 01/02/2012 1602   HGB 14.5 01/02/2012 1602   HCT 44.0 01/02/2012 1602   PLT 166 01/02/2012 1602   MCV 99.5 01/02/2012 1602   MCH 32.8 01/02/2012 1602   MCHC 33.0 01/02/2012 1602   RDW 13.7  01/02/2012 1602   LYMPHSABS 1.6 01/02/2012 1602   MONOABS 0.6 01/02/2012 1602   EOSABS 0.1 01/02/2012 1602   BASOSABS 0.0 01/02/2012 1602

## 2012-03-28 NOTE — Patient Instructions (Signed)
Your physician recommends that you continue on your current medications as directed. Please refer to the Current Medication list given to you today.  Your physician wants you to follow-up in: 1 year. You will receive a reminder letter in the mail two months in advance. If you don't receive a letter, please call our office to schedule the follow-up appointment.  

## 2012-04-13 ENCOUNTER — Other Ambulatory Visit: Payer: Self-pay

## 2012-04-13 MED ORDER — CLOPIDOGREL BISULFATE 75 MG PO TABS: 75.0000 mg | ORAL_TABLET | Freq: Every day | ORAL | Status: DC

## 2012-04-13 MED ORDER — PANTOPRAZOLE SODIUM 40 MG PO TBEC: 40.0000 mg | DELAYED_RELEASE_TABLET | Freq: Every day | ORAL | Status: DC

## 2012-07-02 ENCOUNTER — Telehealth: Payer: Self-pay | Admitting: Internal Medicine

## 2012-07-02 MED ORDER — MELOXICAM 15 MG PO TABS: 15.0000 mg | ORAL_TABLET | Freq: Every day | ORAL | Status: DC

## 2012-07-02 NOTE — Telephone Encounter (Signed)
[facility]

## 2012-07-02 NOTE — Telephone Encounter (Signed)
Pt called and is req to get the meloxicam changed to something else. Refused to come in for ov re: this matter. Req Kim to call him back asap.

## 2012-07-31 ENCOUNTER — Other Ambulatory Visit: Payer: Self-pay

## 2012-07-31 MED ORDER — CLOPIDOGREL BISULFATE 75 MG PO TABS: 75.0000 mg | ORAL_TABLET | Freq: Every day | ORAL | Status: DC

## 2012-07-31 NOTE — Telephone Encounter (Signed)
Rx request sent for Plavix.

## 2012-12-24 ENCOUNTER — Telehealth: Payer: Self-pay | Admitting: Cardiology

## 2012-12-24 ENCOUNTER — Encounter: Payer: Self-pay | Admitting: Cardiology

## 2012-12-24 ENCOUNTER — Ambulatory Visit (INDEPENDENT_AMBULATORY_CARE_PROVIDER_SITE_OTHER): Payer: Medicare Other | Admitting: Cardiology

## 2012-12-24 VITALS — BP 140/78 | HR 70 | Resp 19 | Ht 62.0 in | Wt 152.0 lb

## 2012-12-24 DIAGNOSIS — I493 Ventricular premature depolarization: Secondary | ICD-10-CM

## 2012-12-24 DIAGNOSIS — I4949 Other premature depolarization: Secondary | ICD-10-CM

## 2012-12-24 DIAGNOSIS — R609 Edema, unspecified: Secondary | ICD-10-CM

## 2012-12-24 MED ORDER — METOPROLOL SUCCINATE ER 25 MG PO TB24: 25.0000 mg | ORAL_TABLET | Freq: Every day | ORAL | Status: DC

## 2012-12-24 NOTE — Telephone Encounter (Signed)
Appt scheduled tomorrow at 4:00pm with Dr [name], DOD.  Pt not home left appt date and time with Mrs Krumenacker.

## 2012-12-24 NOTE — Telephone Encounter (Signed)
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  He refused a PA appt.  He states he has been having chest discomfort (5/10) which he describes as dull and squeezing.  He denies sob, sweating or nausea with the pain.  It lasts about 30 minutes and is relieved with one nitro.  He states he gets it when eating spicy foods or drinking alcohol.  He states he drank a half of a beer yesterday when he developed it.  He states it starts when he is at rest and is not exertional.  He states he is leaving town on Wed, 01/02/13 for a month and wants a cardiac workup before that time.

## 2012-12-24 NOTE — Progress Notes (Signed)
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  He came in because of some concern over tightness in the chest relieved by sublingual nitroglycerin.  In reviewing his old records he has had these complaints in the past.  He does not experience any exertional chest discomfort.  He develops tightness in his chest if he has an alcoholic drink and he did have an alcoholic drink last night at a [employer] party and then began developing chest tightness.  Normally he would take a nitroglycerin but did not have any of them with him so he left the party and went home and took one with relief.  The patient gives a history of having had a large hiatal hernia for many years associated with spasms.  Dr. Lyla Son has done numerous upper endoscopies and dilatations of his esophagus in the past but none in the past 10 years or so.  The patient continues to be physically active.  He goes to the carotids to exercise on a daily basis.  He rides an exercise bicycle which does not cause any chest tightness or discomfort.  He has had previous stress tests which have been negative for ischemia.  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  Recently the patient has noted some mild ankle edema and he plans to cut back on his dietary salt intake.  He has not been having any orthopnea and he sleeps flat.  He states that his weight has been stable.  His last echocardiogram was 03/03/10 which showed a normal ejection fraction of 60-65% and there was minimal aortic insufficiency. His family history reveals that his father had a heart attack in his 59s but lived another 28 years and died in his 48s.  His mother lived to be 24.  Current  Outpatient Prescriptions  Medication Sig Dispense Refill  . clopidogrel (PLAVIX) 75 MG tablet Take 1 tablet (75 mg total) by mouth daily.  90 tablet  3  . folic acid (FOLVITE) 1 MG tablet Take 1 mg by mouth daily.        Marland Kitchen levothyroxine (SYNTHROID, LEVOTHROID) 50 MCG tablet Take 1 tablet (50 mcg total) by mouth daily.  90 tablet  3  . meloxicam (MOBIC) 15 MG tablet Take 1 tablet (15 mg total) by mouth daily.  90 tablet  6  . methotrexate (RHEUMATREX) 2.5 MG tablet Take 10 mg by mouth once a week. On Mondays; Caution:Chemotherapy. Protect from light. 4 tablets weekly      . Multiple Vitamin (MULTIVITAMIN) tablet Take 1 tablet by mouth daily.        . nitroGLYCERIN (NITROSTAT) 0.4 MG SL tablet Place 0.4 mg under the tongue every 5 (five) minutes as needed.      . pantoprazole (PROTONIX) 40 MG tablet Take 1 tablet (40 mg total) by mouth daily.  90 tablet  6  . metoprolol succinate (TOPROL XL) 25 MG 24 hr tablet Take 1 tablet (25 mg total) by mouth daily.  90 tablet  3    Allergies  Allergen Reactions  . Sulfonamide Derivatives     Patient Active Problem List  Diagnosis  . HYPERLIPIDEMIA  . GERD  .  BENIGN PROSTATIC HYPERTROPHY  . RHEUMATOID ARTHRITIS  . Osteoarth NOS-Unspec  . PEDAL EDEMA  . PALPITATIONS  . CHEST DISCOMFORT  . TRANSIENT ISCHEMIC ATTACK, HX OF  . Dyspnea    History  Smoking status  . Former Smoker  . Quit date: 08/13/1953  Smokeless tobacco  . Never Used    History  Alcohol Use  . 0.6 oz/week  . 1 Shots of liquor per week    No family history on file.  Review of Systems: Constitutional: no fever chills diaphoresis or fatigue or change in weight.  Head and neck: no hearing loss, no epistaxis, no photophobia or visual disturbance. Respiratory: No cough, shortness of breath or wheezing. Cardiovascular: No chest pain peripheral edema, palpitations. Gastrointestinal: No abdominal distention, no abdominal pain, no change in bowel habits hematochezia or  melena. Genitourinary: No dysuria, no frequency, no urgency, no nocturia. Musculoskeletal:No arthralgias, no back pain, no gait disturbance or myalgias. Neurological: No dizziness, no headaches, no numbness, no seizures, no syncope, no weakness, no tremors. Hematologic: No lymphadenopathy, no easy bruising. Psychiatric: No confusion, no hallucinations, no sleep disturbance.    Physical Exam: Filed Vitals:   12/24/12 1638  BP: 140/78  Pulse: 70  Resp: 19   the general appearance reveals a alert active 79 year old gentleman in no distress.  He has not had any chest discomfort at the present time.Pupils equal and reactive.   Extraocular Movements are full.  There is no scleral icterus.  The mouth and pharynx are normal.  The neck is supple.  The carotids reveal no bruits.  The jugular venous pressure is normal.  The thyroid is not enlarged.  There is no lymphadenopathy.  The chest is clear to percussion and auscultation. There are no rales or rhonchi. Expansion of the chest is symmetrical.  The precordium is quiet.  The first heart sound is normal.  The second heart sound is physiologically split.  There is no murmur gallop rub or click.  There is no abnormal lift or heave.  The pulse is irregular because of frequent unifocal PVCs. Abdomen reveals no abdominal tenderness.  He does have very active epigastric bowel sounds consistent with his history of large hiatal hernia. Extremities reveal 1-2+ pretibial and ankle edema. Strength is normal and symmetrical in all extremities.  There is no lateralizing weakness.  There are no sensory deficits. The skin is warm and dry.  There is no rash.   EKG shows normal sinus rhythm with frequent PVCs.  There are no ischemic changes.     Assessment / Plan: 1. chest pain which is atypical and does not sound cardiac.  In the past it has been felt to be secondary to his large hiatal hernia and esophageal spasm with relief by sublingual nitroglycerin. 2.  recurrence of frequent PVCs which he has had in the past and for which he previously had taken a beta blocker which he subsequently stopped on his own. 3. past history of suspected TIAs for which he is now on long-term Plavix 4. history of rheumatoid arthritis and history of osteoarthritis, on methotrexate and on meloxicam.  Plan: We will update his echocardiogram in view of his recent development of pedal edema.  We will also update his chest x-ray.  He will cut back on dietary salt.  We talked about the fact that Mobic could also be contributing to his hypertension. For his symptomatic PVCs we will put him back on a beta blocker in the lower dose.  He will be on  generic Toprol XL 25 one tablet daily.  If a full tablet causes him to be too fatigued he can cut back to just 12.5 mg daily. He will be spending the next month or so in [place].  He will return for his yearly checkup with Dr. Verl Blalock later this spring.

## 2012-12-24 NOTE — Telephone Encounter (Signed)
New Problem:    Patient called in because he has been experiencing some chest discomfort in the past few days.  Patient is not currently having discomfort.  Patient will be leaving the country for a month.  Please call back.

## 2012-12-24 NOTE — Patient Instructions (Addendum)
Your physician has requested that you have an echocardiogram. Echocardiography is a painless test that uses sound waves to create images of your heart. It provides your doctor with information about the size and shape of your heart and how well your heart's chambers and valves are working. This procedure takes approximately one hour. There are no restrictions for this procedure.  WILL HAVE YOU GO FOR CHEST XRAY TO [place] FROM [place]  START TOPROL XL (METOPROLOL) 25 MG DAILY, [facility]

## 2012-12-26 ENCOUNTER — Ambulatory Visit (HOSPITAL_COMMUNITY): Payer: Medicare Other | Attending: Cardiology

## 2012-12-26 ENCOUNTER — Ambulatory Visit (INDEPENDENT_AMBULATORY_CARE_PROVIDER_SITE_OTHER)
Admission: RE | Admit: 2012-12-26 | Discharge: 2012-12-26 | Disposition: A | Discharge status: Home / Self Care | Payer: Medicare Other | Source: Ambulatory Visit | Attending: Cardiology | Admitting: Cardiology

## 2012-12-26 DIAGNOSIS — R609 Edema, unspecified: Secondary | ICD-10-CM

## 2012-12-26 DIAGNOSIS — R0989 Other specified symptoms and signs involving the circulatory and respiratory systems: Secondary | ICD-10-CM | POA: Insufficient documentation

## 2012-12-26 DIAGNOSIS — E785 Hyperlipidemia, unspecified: Secondary | ICD-10-CM | POA: Insufficient documentation

## 2012-12-26 DIAGNOSIS — R0609 Other forms of dyspnea: Secondary | ICD-10-CM | POA: Insufficient documentation

## 2012-12-26 DIAGNOSIS — R002 Palpitations: Secondary | ICD-10-CM | POA: Insufficient documentation

## 2012-12-26 DIAGNOSIS — I4949 Other premature depolarization: Secondary | ICD-10-CM

## 2012-12-26 NOTE — Progress Notes (Signed)
Echocardiogram performed.  

## 2012-12-31 ENCOUNTER — Telehealth: Payer: Self-pay | Admitting: Cardiology

## 2012-12-31 NOTE — Telephone Encounter (Signed)
Left message to call back  

## 2012-12-31 NOTE — Telephone Encounter (Signed)
New Problem    Calling in regards test results.

## 2013-01-01 NOTE — Telephone Encounter (Signed)
Pt rtn your call yesterday

## 2013-01-01 NOTE — Telephone Encounter (Signed)
Spoke with patient and advised  Dr. [name] had no further recommendations after reviewing echo. He states he felt his problems were related to stomach issues.  Advised to continue same medications and follow up in March when he is back in town.

## 2013-01-01 NOTE — Telephone Encounter (Signed)
Agree 

## 2013-02-13 ENCOUNTER — Ambulatory Visit (INDEPENDENT_AMBULATORY_CARE_PROVIDER_SITE_OTHER): Payer: Medicare Other | Admitting: Cardiology

## 2013-02-13 ENCOUNTER — Encounter: Payer: Self-pay | Admitting: Cardiology

## 2013-02-13 VITALS — BP 108/80 | HR 96 | Ht 63.0 in | Wt 149.0 lb

## 2013-02-13 DIAGNOSIS — Z8679 Personal history of other diseases of the circulatory system: Secondary | ICD-10-CM

## 2013-02-13 DIAGNOSIS — R0609 Other forms of dyspnea: Secondary | ICD-10-CM

## 2013-02-13 DIAGNOSIS — R0600 Dyspnea, unspecified: Secondary | ICD-10-CM

## 2013-02-13 DIAGNOSIS — R0989 Other specified symptoms and signs involving the circulatory and respiratory systems: Secondary | ICD-10-CM

## 2013-02-13 DIAGNOSIS — R0789 Other chest pain: Secondary | ICD-10-CM

## 2013-02-13 NOTE — Patient Instructions (Addendum)
Your physician recommends that you continue on your current medications as directed. Please refer to the Current Medication list given to you today.  Your physician wants you to follow-up in: 4 month ov/ekg  You will receive a reminder letter in the mail two months in advance. If you don't receive a letter, please call our office to schedule the follow-up appointment.  

## 2013-02-13 NOTE — Progress Notes (Signed)
Robert Rowe Date of Birth:  [birth year] [facility] [address] 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  We had previously seen him several months ago because of some concern over tightness in the chest relieved by sublingual nitroglycerin. In reviewing his old records he has had these complaints in the past. He does not experience any exertional chest discomfort. He develops tightness in his chest if he has an alcoholic drink   . The patient gives a history of having had a large hiatal hernia for many years associated with spasms. Dr. Lyla Son has done numerous upper endoscopies and dilatations of his esophagus in the past but none in the past 10 years or so. The patient continues to be physically active. He goes to the gym to exercise on a daily basis. He rides an exercise bicycle which does not cause any chest tightness or discomfort. He has had previous stress tests which have been negative for ischemia. 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 Recently the patient has noted some mild ankle edema and he plans to cut back on his dietary salt intake. He has not been having any orthopnea and he sleeps flat. He states that his weight has been stable. His last echocardiogram was 03/03/10 which showed a normal ejection fraction of 60-65% and there was minimal aortic insufficiency.   Current Outpatient Prescriptions  Medication Sig Dispense Refill  . clopidogrel (PLAVIX) 75 MG tablet Take 1 tablet (75 mg total) by mouth daily.  90 tablet  3  . folic acid (FOLVITE) 1 MG tablet Take 1 mg by mouth daily.        Marland Kitchen levothyroxine (SYNTHROID, LEVOTHROID) 50 MCG tablet Take 1 tablet (50 mcg total) by mouth daily.  90 tablet  3  . meloxicam (MOBIC) 15 MG tablet Take 1  tablet (15 mg total) by mouth daily.  90 tablet  6  . methotrexate (RHEUMATREX) 2.5 MG tablet Take 10 mg by mouth once a week. On Mondays; Caution:Chemotherapy. Protect from light. 4 tablets weekly      . metoprolol succinate (TOPROL XL) 25 MG 24 hr tablet Take 1 tablet (25 mg total) by mouth daily.  90 tablet  3  . Multiple Vitamin (MULTIVITAMIN) tablet Take 1 tablet by mouth daily.        . nitroGLYCERIN (NITROSTAT) 0.4 MG SL tablet Place 0.4 mg under the tongue every 5 (five) minutes as needed.      . pantoprazole (PROTONIX) 40 MG tablet Take 1 tablet (40 mg total) by mouth daily.  90 tablet  6   No current facility-administered medications for this visit.    Allergies  Allergen Reactions  . Sulfonamide Derivatives     Patient Active Problem List  Diagnosis  . HYPERLIPIDEMIA  . GERD  . BENIGN PROSTATIC HYPERTROPHY  . RHEUMATOID ARTHRITIS  . Osteoarth NOS-Unspec  . PEDAL EDEMA  . PALPITATIONS  . CHEST DISCOMFORT  . TRANSIENT ISCHEMIC ATTACK, HX OF  . Dyspnea    History  Smoking status  . Former Smoker  . Quit date: 08/13/1953  Smokeless tobacco  . Never Used    History  Alcohol Use  . 0.6 oz/week  . 1 Shots of liquor per week  No family history on file.  Review of Systems: Constitutional: no fever chills diaphoresis or fatigue or change in weight.  Head and neck: no hearing loss, no epistaxis, no photophobia or visual disturbance. Respiratory: No cough, shortness of breath or wheezing. Cardiovascular: No chest pain peripheral edema, palpitations. Gastrointestinal: No abdominal distention, no abdominal pain, no change in bowel habits hematochezia or melena. Genitourinary: No dysuria, no frequency, no urgency, no nocturia. Musculoskeletal:No arthralgias, no back pain, no gait disturbance or myalgias. Neurological: No dizziness, no headaches, no numbness, no seizures, no syncope, no weakness, no tremors. Hematologic: No lymphadenopathy, no easy  bruising. Psychiatric: No confusion, no hallucinations, no sleep disturbance.    Physical Exam: Filed Vitals:   02/13/13 1352  BP: 108/80  Pulse: 96   the general appearance reveals a well-developed well-nourished gentleman in no distress.The head and neck exam reveals pupils equal and reactive.  Extraocular movements are full.  There is no scleral icterus.  The mouth and pharynx are normal.  The neck is supple.  The carotids reveal no bruits.  The jugular venous pressure is normal.  The  thyroid is not enlarged.  There is no lymphadenopathy.  The chest is clear to percussion and auscultation.  There are no rales or rhonchi.  Expansion of the chest is symmetrical.  The precordium is quiet.  The first heart sound is normal.  The second heart sound is physiologically split.  There is no murmur gallop rub or click.  There is no abnormal lift or heave.  The abdomen is soft and nontender.  The bowel sounds are normal.  The liver and spleen are not enlarged.  There are no abdominal masses.  There are no abdominal bruits.  Extremities reveal good pedal pulses.  There is no phlebitis or edema.  There is no cyanosis or clubbing.  Strength is normal and symmetrical in all extremities.  There is no lateralizing weakness.  There are no sensory deficits.  The skin is warm and dry.  There is no rash.     Assessment / Plan: Continue same medication.  Recheck in 4 months for office visit and EKG.

## 2013-02-13 NOTE — Assessment & Plan Note (Signed)
The patient has had no recurrence of TIA symptoms since on Plavix

## 2013-02-13 NOTE — Assessment & Plan Note (Signed)
The patient does not have any history of exertional chest pain or angina.  He does have occasional chest discomfort related to his large hiatal hernia and precipitated if he drinks alcohol.  He does get chest pain he takes a sublingual nitroglycerin with relief.

## 2013-02-13 NOTE — Assessment & Plan Note (Signed)
The patient has not had any increased in dyspnea.  Stays very physically active.

## 2013-02-15 ENCOUNTER — Other Ambulatory Visit: Payer: Self-pay | Admitting: Internal Medicine

## 2013-02-15 DIAGNOSIS — R1310 Dysphagia, unspecified: Secondary | ICD-10-CM

## 2013-02-19 ENCOUNTER — Telehealth: Payer: Self-pay | Admitting: Gastroenterology

## 2013-02-19 ENCOUNTER — Telehealth: Payer: Self-pay

## 2013-02-19 DIAGNOSIS — R1310 Dysphagia, unspecified: Secondary | ICD-10-CM

## 2013-02-19 NOTE — Telephone Encounter (Signed)
Pt states he has been seen by Dr. Lyla Son in the past and had EGD with dil. States he has been doing well until recently he started having esophageal pain that is relieved by antiacids and nitroglycerin. States he saw his cardiologist last week and it is not his heart. Pt is out of town for a month and would like to talk to Dr. Deatra Ina regarding his symptoms and what he might need to have done. Pt would like a call on his cell at [phone]. 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

## 2013-02-19 NOTE — Telephone Encounter (Signed)
Pt is waiting on Dr. Guillermina City call see previous note.

## 2013-02-19 NOTE — Telephone Encounter (Signed)
error 

## 2013-02-20 NOTE — Telephone Encounter (Signed)
Having odynophagia and dysphagia. Patient is on Plavix. He has undergone esophageal dilation in the past but it has been over 10 years.  Will arrange to obtain a barium swallow and then see him on that same day since the patient is coming from out-of-town

## 2013-02-20 NOTE — Telephone Encounter (Signed)
How soon does this pt need to be seen?

## 2013-02-21 NOTE — Telephone Encounter (Signed)
Patient to see Dr Deatra Ina at [facility] in the morning Then will go to radiology to have barium swallow done

## 2013-02-21 NOTE — Telephone Encounter (Signed)
You have been scheduled for a Barium Esophogram at [facility] (1st floor of the hospital) on 02/22/2013 at 11:30. Please arrive 15 minutes prior to your appointment for registration. Make certain not to have anything to eat or drink 6 hours prior to your test. If you need to reschedule for any reason, please contact radiology at [phone] to do so. __________________________________________________________________ A barium swallow is an examination that concentrates on views of the esophagus. This tends to be a double contrast exam (barium and two liquids which, when combined, create a gas to distend the wall of the oesophagus) or single contrast (non-ionic iodine based). The study is usually tailored to your symptoms so a good history is essential. Attention is paid during the study to the form, structure and configuration of the esophagus, looking for functional disorders (such as aspiration, dysphagia, achalasia, motility and reflux) EXAMINATION You may be asked to change into a gown, depending on the type of swallow being performed. A radiologist and radiographer will perform the procedure. The radiologist will advise you of the type of contrast selected for your procedure and direct you during the exam. You will be asked to stand, sit or lie in several different positions and to hold a small amount of fluid in your mouth before being asked to swallow while the imaging is performed .In some instances you may be asked to swallow barium coated marshmallows to assess the motility of a solid food bolus. The exam can be recorded as a digital or video fluoroscopy procedure. POST PROCEDURE It will take 1-2 days for the barium to pass through your system. To facilitate this, it is important, unless otherwise directed, to increase your fluids for the next 24-48hrs and to resume your normal diet.  This test typically takes about 30 minutes to  perform. __________________________________________________________________________________

## 2013-02-21 NOTE — Telephone Encounter (Signed)
The week I return

## 2013-02-22 ENCOUNTER — Ambulatory Visit (INDEPENDENT_AMBULATORY_CARE_PROVIDER_SITE_OTHER): Payer: Medicare Other | Admitting: Gastroenterology

## 2013-02-22 ENCOUNTER — Encounter: Payer: Self-pay | Admitting: Gastroenterology

## 2013-02-22 ENCOUNTER — Ambulatory Visit (HOSPITAL_COMMUNITY)
Admission: RE | Admit: 2013-02-22 | Discharge: 2013-02-22 | Disposition: A | Discharge status: Home / Self Care | Payer: Medicare Other | Source: Ambulatory Visit | Attending: Gastroenterology | Admitting: Gastroenterology

## 2013-02-22 VITALS — BP 118/70 | HR 78 | Ht 63.0 in | Wt 149.0 lb

## 2013-02-22 DIAGNOSIS — K219 Gastro-esophageal reflux disease without esophagitis: Secondary | ICD-10-CM

## 2013-02-22 DIAGNOSIS — K224 Dyskinesia of esophagus: Secondary | ICD-10-CM | POA: Insufficient documentation

## 2013-02-22 DIAGNOSIS — R0789 Other chest pain: Secondary | ICD-10-CM

## 2013-02-22 DIAGNOSIS — R1310 Dysphagia, unspecified: Secondary | ICD-10-CM

## 2013-02-22 DIAGNOSIS — Q409 Congenital malformation of upper alimentary tract, unspecified: Secondary | ICD-10-CM | POA: Insufficient documentation

## 2013-02-22 DIAGNOSIS — K449 Diaphragmatic hernia without obstruction or gangrene: Secondary | ICD-10-CM | POA: Insufficient documentation

## 2013-02-22 DIAGNOSIS — K225 Diverticulum of esophagus, acquired: Secondary | ICD-10-CM | POA: Insufficient documentation

## 2013-02-22 NOTE — Patient Instructions (Addendum)
Go ahead and go to [facility] to your scheduled appointment for your Barium Swallow

## 2013-02-22 NOTE — Assessment & Plan Note (Signed)
Chest discomfort is probably esophageal, perhaps do to stricturing or esophagitis.  Recommendations #1 barium swallow

## 2013-02-22 NOTE — Assessment & Plan Note (Signed)
Symptoms appear to be well-controlled with PPI therapy.

## 2013-02-22 NOTE — Assessment & Plan Note (Signed)
Rule out recurrent esophageal stricture  Recommendations #1 following barium swallow I will likely schedule him for upper endoscopy  Note patient is on Plavix. I will discuss with Dr. [name] whether we can hold his medication.

## 2013-02-22 NOTE — Progress Notes (Signed)
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 He's undergone esophageal dilatation multiple times in the past, the last procedure at least 15 years ago. Chest tightness is partially relieved with nitroglycerin and antacids. Episodes are becoming more frequent. Over the past year he's been experiencing episodes of chest tightness postprandially. He may have some dysphagia to solids as well. He denies pyrosis. He has undergone cardiac workup which apparently was normal. Chest discomfort is not exercise-related. He may also have odynophagia.    Past Medical History  Diagnosis Date  . BENIGN PROSTATIC HYPERTROPHY 04/05/2007  . CHEST DISCOMFORT 04/25/2009  . GERD 12/03/2007  . HYPERLIPIDEMIA 04/05/2007  . Osteoarth NOS-Unspec 04/05/2007  . Palpitations 02/12/2010  . PEDAL EDEMA 05/01/2008  . Rheumatoid arthritis 04/05/2007  . TRANSIENT ISCHEMIC ATTACK, HX OF 04/05/2007  . DJD (degenerative joint disease)   . Chronic kidney disease     chronic   . Hiatal hernia    Past Surgical History  Procedure Laterality Date  . Hemorrhoid surgery    . Hernia repair      inguinal x5  . Cataract extraction    . Knee surgery      left  . Shoulder surgery      left   family history includes Breast cancer in his mother; Heart disease in his father; and Lymphoma in his brother. Current Outpatient Prescriptions  Medication Sig Dispense Refill  . clopidogrel (PLAVIX) 75 MG tablet Take 1 tablet (75 mg total) by mouth daily.  90 tablet  3  . folic acid (FOLVITE) 1 MG tablet Take 1 mg by mouth daily.        Marland Kitchen levothyroxine (SYNTHROID, LEVOTHROID) 50 MCG tablet Take 1 tablet (50 mcg total) by mouth daily.  90 tablet  3  . meloxicam (MOBIC) 15 MG tablet Take 1 tablet (15 mg total) by mouth daily.  90 tablet  6  . methotrexate (RHEUMATREX) 2.5 MG tablet Take 10 mg by mouth once a week. On Mondays; Caution:Chemotherapy. Protect from  light. 4 tablets weekly      . metoprolol succinate (TOPROL XL) 25 MG 24 hr tablet Take 1 tablet (25 mg total) by mouth daily.  90 tablet  3  . Multiple Vitamin (MULTIVITAMIN) tablet Take 1 tablet by mouth daily.        . nitroGLYCERIN (NITROSTAT) 0.4 MG SL tablet Place 0.4 mg under the tongue every 5 (five) minutes as needed.      . pantoprazole (PROTONIX) 40 MG tablet Take 1 tablet (40 mg total) by mouth daily.  90 tablet  6   No current facility-administered medications for this visit.   Allergies as of 02/22/2013 - Review Complete 02/22/2013  Allergen Reaction Noted  . Sulfonamide derivatives      reports that he quit smoking about 59 years ago. He has never used smokeless tobacco. He reports that he drinks about 0.6 ounces of alcohol per week. He reports that he does not use illicit drugs.     Review of Systems: Pertinent positive and negative review of systems were noted in the above HPI section. All other review of systems were otherwise negative.  Vital signs were reviewed in today's medical record Physical Exam: General: Well developed , well nourished, no acute distress Skin: anicteric Head: Normocephalic and atraumatic Eyes:  sclerae anicteric, EOMI Ears: Normal auditory acuity Mouth: No deformity or lesions Neck: Supple, no masses or thyromegaly Lungs: Clear throughout to auscultation Heart: Regular rate  and rhythm; no murmurs, rubs or bruits Abdomen: Soft, non tender and non distended. No masses, hepatosplenomegaly or hernias noted. Normal Bowel sounds Rectal:deferred Musculoskeletal: Symmetrical with no gross deformities  Skin: No lesions on visible extremities Pulses:  Normal pulses noted Extremities: No clubbing, cyanosis, edema or deformities noted Neurological: Alert oriented x 4, grossly nonfocal Cervical Nodes:  No significant cervical adenopathy Inguinal Nodes: No significant inguinal adenopathy Psychological:  Alert and cooperative. Normal mood and  affect

## 2013-02-25 ENCOUNTER — Ambulatory Visit (HOSPITAL_COMMUNITY): Payer: Medicare Other

## 2013-03-01 ENCOUNTER — Other Ambulatory Visit: Payer: Self-pay | Admitting: Gastroenterology

## 2013-03-01 ENCOUNTER — Telehealth: Payer: Self-pay | Admitting: Gastroenterology

## 2013-03-01 ENCOUNTER — Telehealth: Payer: Self-pay

## 2013-03-01 DIAGNOSIS — R1319 Other dysphagia: Secondary | ICD-10-CM

## 2013-03-01 NOTE — Telephone Encounter (Signed)
Pt scheduled for EGD with dil at [facility] 03/05/13@12 :30pm. Pt to arrive at the hospital at 11:30am. Pt aware of prep instructions and appt date and time.

## 2013-03-04 ENCOUNTER — Telehealth: Payer: Self-pay | Admitting: *Deleted

## 2013-03-05 ENCOUNTER — Telehealth: Payer: Self-pay | Admitting: *Deleted

## 2013-03-05 NOTE — Telephone Encounter (Signed)
Patient called in on 03/04/13 at 4:25 PM because he lost his instructions for EGD. Reviewed instructions and patient states he is on Plavix.He states he has been at [facility] and ended up in ED with chest pain. Heart problems were ruled out and patient was told it was his stomach causing the problems. Discussed with Dr. Deatra Ina and was told to call Dr. Sherryl Barters office to see if patient can come off Plavix. Called and spoke with Melinda(Dr. Brackbill's nurse) to see if patient can come off Plavix for 5 days prior to procedure. Melinda discussed and Dr. [name] states patient can come off Plavix for procedure. Called and told patient we are going to reschedule procedure. Spoke with Sharee Pimple at [facility] endo and scheduled patient for EGD with dil on 03/12/13 at 11:30 AM. Spoke with patient and gave him appointment date, time and instructions. Faxed written instructions to patient.

## 2013-03-06 ENCOUNTER — Encounter: Payer: Self-pay | Admitting: Gastroenterology

## 2013-03-11 ENCOUNTER — Encounter: Payer: Self-pay | Admitting: *Deleted

## 2013-03-12 ENCOUNTER — Encounter (HOSPITAL_COMMUNITY)
Admission: RE | Disposition: A | Discharge status: Home / Self Care | Payer: Self-pay | Source: Ambulatory Visit | Attending: Gastroenterology

## 2013-03-12 ENCOUNTER — Encounter (HOSPITAL_COMMUNITY): Payer: Self-pay | Admitting: *Deleted

## 2013-03-12 ENCOUNTER — Ambulatory Visit (HOSPITAL_COMMUNITY)
Admission: RE | Admit: 2013-03-12 | Discharge: 2013-03-12 | Disposition: A | Discharge status: Home / Self Care | Payer: Medicare Other | Source: Ambulatory Visit | Attending: Gastroenterology | Admitting: Gastroenterology

## 2013-03-12 DIAGNOSIS — N400 Enlarged prostate without lower urinary tract symptoms: Secondary | ICD-10-CM | POA: Insufficient documentation

## 2013-03-12 DIAGNOSIS — K219 Gastro-esophageal reflux disease without esophagitis: Secondary | ICD-10-CM | POA: Insufficient documentation

## 2013-03-12 DIAGNOSIS — Z791 Long term (current) use of non-steroidal anti-inflammatories (NSAID): Secondary | ICD-10-CM | POA: Insufficient documentation

## 2013-03-12 DIAGNOSIS — Z7901 Long term (current) use of anticoagulants: Secondary | ICD-10-CM | POA: Insufficient documentation

## 2013-03-12 DIAGNOSIS — E785 Hyperlipidemia, unspecified: Secondary | ICD-10-CM | POA: Insufficient documentation

## 2013-03-12 DIAGNOSIS — M069 Rheumatoid arthritis, unspecified: Secondary | ICD-10-CM | POA: Insufficient documentation

## 2013-03-12 DIAGNOSIS — R1319 Other dysphagia: Secondary | ICD-10-CM

## 2013-03-12 DIAGNOSIS — R002 Palpitations: Secondary | ICD-10-CM | POA: Insufficient documentation

## 2013-03-12 DIAGNOSIS — R0789 Other chest pain: Secondary | ICD-10-CM | POA: Insufficient documentation

## 2013-03-12 DIAGNOSIS — Z79899 Other long term (current) drug therapy: Secondary | ICD-10-CM | POA: Insufficient documentation

## 2013-03-12 DIAGNOSIS — R609 Edema, unspecified: Secondary | ICD-10-CM | POA: Insufficient documentation

## 2013-03-12 DIAGNOSIS — Z8673 Personal history of transient ischemic attack (TIA), and cerebral infarction without residual deficits: Secondary | ICD-10-CM | POA: Insufficient documentation

## 2013-03-12 DIAGNOSIS — N189 Chronic kidney disease, unspecified: Secondary | ICD-10-CM | POA: Insufficient documentation

## 2013-03-12 DIAGNOSIS — M1990 Unspecified osteoarthritis, unspecified site: Secondary | ICD-10-CM | POA: Insufficient documentation

## 2013-03-12 DIAGNOSIS — K222 Esophageal obstruction: Secondary | ICD-10-CM

## 2013-03-12 DIAGNOSIS — K449 Diaphragmatic hernia without obstruction or gangrene: Secondary | ICD-10-CM | POA: Insufficient documentation

## 2013-03-12 DIAGNOSIS — Q409 Congenital malformation of upper alimentary tract, unspecified: Secondary | ICD-10-CM | POA: Insufficient documentation

## 2013-03-12 DIAGNOSIS — Z882 Allergy status to sulfonamides status: Secondary | ICD-10-CM | POA: Insufficient documentation

## 2013-03-12 HISTORY — PX: ESOPHAGOGASTRODUODENOSCOPY: SHX5428

## 2013-03-12 HISTORY — PX: BALLOON DILATION: SHX5330

## 2013-03-12 SURGERY — EGD (ESOPHAGOGASTRODUODENOSCOPY)
Anesthesia: Moderate Sedation

## 2013-03-12 MED ORDER — CLOPIDOGREL BISULFATE 75 MG PO TABS: 75.0000 mg | ORAL_TABLET | Freq: Every day | ORAL | Status: DC

## 2013-03-12 MED ORDER — MIDAZOLAM HCL 10 MG/2ML IJ SOLN
INTRAMUSCULAR | Status: DC | PRN
  Administered 2013-03-12 (×2): 1 mg via INTRAVENOUS

## 2013-03-12 MED ORDER — MIDAZOLAM HCL 10 MG/2ML IJ SOLN
INTRAMUSCULAR | Status: AC
  Filled 2013-03-12: qty 2

## 2013-03-12 MED ORDER — BUTAMBEN-TETRACAINE-BENZOCAINE 2-2-14 % EX AERO
INHALATION_SPRAY | CUTANEOUS | Status: DC | PRN
  Administered 2013-03-12: 2 via TOPICAL

## 2013-03-12 MED ORDER — FENTANYL CITRATE 0.05 MG/ML IJ SOLN
INTRAMUSCULAR | Status: DC | PRN
  Administered 2013-03-12: 25 ug via INTRAVENOUS
  Administered 2013-03-12: 12.5 ug via INTRAVENOUS

## 2013-03-12 MED ORDER — SODIUM CHLORIDE 0.9 % IV SOLN: INTRAVENOUS | Status: DC

## 2013-03-12 MED ORDER — FENTANYL CITRATE 0.05 MG/ML IJ SOLN
INTRAMUSCULAR | Status: AC
  Filled 2013-03-12: qty 2

## 2013-03-12 NOTE — Discharge Instructions (Addendum)
Resume Plavix in 5 days Call office to report followup in approximately 2 weeks  Gastrointestinal Endoscopy Care After Refer to this sheet in the next few weeks. These instructions provide you with information on caring for yourself after your procedure. Your caregiver may also give you more specific instructions. Your treatment has been planned according to current medical practices, but problems sometimes occur. Call your caregiver if you have any problems or questions after your procedure. HOME CARE INSTRUCTIONS  If you were given medicine to help you relax (sedative), do not drive, operate machinery, or sign important documents for 24 hours.  Avoid alcohol and hot or warm beverages for the first 24 hours after the procedure.  Only take over-the-counter or prescription medicines for pain, discomfort, or fever as directed by your caregiver. You may resume taking your normal medicines unless your caregiver tells you otherwise. Ask your caregiver when you may resume taking medicines that may cause bleeding, such as aspirin, clopidogrel, or warfarin.  You may return to your normal diet and activities on the day after your procedure, or as directed by your caregiver. Walking may help to reduce any bloated feeling in your abdomen.  Drink enough fluids to keep your urine clear or pale yellow.  You may gargle with salt water if you have a sore throat. SEEK IMMEDIATE MEDICAL CARE IF:  You have severe nausea or vomiting.  You have severe abdominal pain, abdominal cramps that last longer than 6 hours, or abdominal swelling (distention).  You have severe shoulder or back pain.  You have trouble swallowing.  You have shortness of breath, your breathing is shallow, or you are breathing faster than normal.  You have a fever or a rapid heartbeat.  You vomit blood or material that looks like coffee grounds.  You have bloody, black, or tarry stools. MAKE SURE YOU:  Understand these  instructions.  Will watch your condition.  Will get help right away if you are not doing well or get worse. Document Released: 06/21/2004 Document Revised: 05/08/2012 Document Reviewed: 02/07/2012 [facility] Patient Information 2013 [place].

## 2013-03-12 NOTE — Interval H&P Note (Signed)
History and Physical Interval Note:  03/12/2013 11:16 AM  Robert Rowe  has presented today for surgery, with the diagnosis of Dysphagia [787.20]  The various methods of treatment have been discussed with the patient and family. After consideration of risks, benefits and other options for treatment, the patient has consented to  Procedure(s): ESOPHAGOGASTRODUODENOSCOPY (EGD) (N/A) BALLOON DILATION (N/A) as a surgical intervention .  The patient's history has been reviewed, patient examined, no change in status, stable for surgery.  I have reviewed the patient's chart and labs.  Questions were answered to the patient's satisfaction.     The recent H&P (dated **02/22/13*) was reviewed, the patient was examined and there is no change in the patients condition since that H&P was completed.   Robert Rowe  03/12/2013, 11:16 AM   Robert Rowe

## 2013-03-12 NOTE — H&P (View-Only) (Signed)
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 He's undergone esophageal dilatation multiple times in the past, the last procedure at least 15 years ago. Chest tightness is partially relieved with nitroglycerin and antacids. Episodes are becoming more frequent. Over the past year he's been experiencing episodes of chest tightness postprandially. He may have some dysphagia to solids as well. He denies pyrosis. He has undergone cardiac workup which apparently was normal. Chest discomfort is not exercise-related. He may also have odynophagia.    Past Medical History  Diagnosis Date  . BENIGN PROSTATIC HYPERTROPHY 04/05/2007  . CHEST DISCOMFORT 04/25/2009  . GERD 12/03/2007  . HYPERLIPIDEMIA 04/05/2007  . Osteoarth NOS-Unspec 04/05/2007  . Palpitations 02/12/2010  . PEDAL EDEMA 05/01/2008  . Rheumatoid arthritis 04/05/2007  . TRANSIENT ISCHEMIC ATTACK, HX OF 04/05/2007  . DJD (degenerative joint disease)   . Chronic kidney disease     chronic   . Hiatal hernia    Past Surgical History  Procedure Laterality Date  . Hemorrhoid surgery    . Hernia repair      inguinal x5  . Cataract extraction    . Knee surgery      left  . Shoulder surgery      left   family history includes Breast cancer in his mother; Heart disease in his father; and Lymphoma in his brother. Current Outpatient Prescriptions  Medication Sig Dispense Refill  . clopidogrel (PLAVIX) 75 MG tablet Take 1 tablet (75 mg total) by mouth daily.  90 tablet  3  . folic acid (FOLVITE) 1 MG tablet Take 1 mg by mouth daily.        Marland Kitchen levothyroxine (SYNTHROID, LEVOTHROID) 50 MCG tablet Take 1 tablet (50 mcg total) by mouth daily.  90 tablet  3  . meloxicam (MOBIC) 15 MG tablet Take 1 tablet (15 mg total) by mouth daily.  90 tablet  6  . methotrexate (RHEUMATREX) 2.5 MG tablet Take 10 mg by mouth once a week. On Mondays; Caution:Chemotherapy. Protect from  light. 4 tablets weekly      . metoprolol succinate (TOPROL XL) 25 MG 24 hr tablet Take 1 tablet (25 mg total) by mouth daily.  90 tablet  3  . Multiple Vitamin (MULTIVITAMIN) tablet Take 1 tablet by mouth daily.        . nitroGLYCERIN (NITROSTAT) 0.4 MG SL tablet Place 0.4 mg under the tongue every 5 (five) minutes as needed.      . pantoprazole (PROTONIX) 40 MG tablet Take 1 tablet (40 mg total) by mouth daily.  90 tablet  6   No current facility-administered medications for this visit.   Allergies as of 02/22/2013 - Review Complete 02/22/2013  Allergen Reaction Noted  . Sulfonamide derivatives      reports that he quit smoking about 59 years ago. He has never used smokeless tobacco. He reports that he drinks about 0.6 ounces of alcohol per week. He reports that he does not use illicit drugs.     Review of Systems: Pertinent positive and negative review of systems were noted in the above HPI section. All other review of systems were otherwise negative.  Vital signs were reviewed in today's medical record Physical Exam: General: Well developed , well nourished, no acute distress Skin: anicteric Head: Normocephalic and atraumatic Eyes:  sclerae anicteric, EOMI Ears: Normal auditory acuity Mouth: No deformity or lesions Neck: Supple, no masses or thyromegaly Lungs: Clear throughout to auscultation Heart: Regular rate  and rhythm; no murmurs, rubs or bruits Abdomen: Soft, non tender and non distended. No masses, hepatosplenomegaly or hernias noted. Normal Bowel sounds Rectal:deferred Musculoskeletal: Symmetrical with no gross deformities  Skin: No lesions on visible extremities Pulses:  Normal pulses noted Extremities: No clubbing, cyanosis, edema or deformities noted Neurological: Alert oriented x 4, grossly nonfocal Cervical Nodes:  No significant cervical adenopathy Inguinal Nodes: No significant inguinal adenopathy Psychological:  Alert and cooperative. Normal mood and  affect

## 2013-03-12 NOTE — Op Note (Signed)
[facility] [place], [address]   ENDOSCOPY PROCEDURE REPORT  PATIENT: Robert, Rowe  DR.  MR#: [id number] BIRTHDATE: 05/12/1918 , 89  yrs. old GENDER: Male ENDOSCOPIST: Inda Castle, MD ASSISTANT:   William Dalton, technician Cleda Daub, RN CGRN REFERRED BY: PROCEDURE DATE:  03/12/2013 PROCEDURE:   EGD with balloon dilatation ASA CLASS:   Class II INDICATIONS:dysphagia. MEDICATIONS: These medications were titrated to patient response per physician's verbal order, Versed 2 mg IV, and Fentanyl-Detailed 37.5 mcg IV TOPICAL ANESTHETIC:   Cetacaine Spray  DESCRIPTION OF PROCEDURE:   After the risks benefits and alternatives of the procedure were thoroughly explained, informed consent was obtained.  The     endoscope was introduced through the mouth  and advanced to the third portion of the duodenum ,      The instrument was slowly withdrawn as the mucosa was carefully examined.      ESOPHAGUS: The esophagus was tortuous.  Stricture was found at the gastroesophageal junction.  The stenosis was traversable with the endoscope.   A 5 cm sliding hiatal hernia was noted.   The remainder of the exam including the stomach and duodenum were normal. Retroflexed view of the gastric cardia was normal. Dilation was then performed at the gastroesphageal junction  Dilator:Balloon Size:15-16.5-18mm letters were inflated for 30 seconds each.  There was moderate resistance and a minimal amount of heme following dilatation.   Reststance:moderate Heme:yes  COMPLICATIONS: There were no complications. ENDOSCOPIC IMPRESSION: 1.   Stricture was found at the gastroesophageal junction - status post balloon dilatation 2.   5 cm hiatal hernia  RECOMMENDATIONS: 1.  resume Plavix in 5 days 2.  continue protonix  eSigned:  Inda Castle, MD 03/12/2013 11:46 AM  CC:

## 2013-03-13 ENCOUNTER — Encounter (HOSPITAL_COMMUNITY): Payer: Self-pay | Admitting: Gastroenterology

## 2013-03-13 ENCOUNTER — Other Ambulatory Visit: Payer: Self-pay | Admitting: *Deleted

## 2013-03-22 ENCOUNTER — Telehealth: Payer: Self-pay | Admitting: Cardiology

## 2013-03-22 NOTE — Telephone Encounter (Signed)
Unable to find doppler

## 2013-03-22 NOTE — Telephone Encounter (Signed)
New problem    Wants a copy of his doppler report from [place].

## 2013-05-08 ENCOUNTER — Other Ambulatory Visit: Payer: Self-pay | Admitting: Internal Medicine

## 2013-05-09 ENCOUNTER — Encounter: Payer: Self-pay | Admitting: Internal Medicine

## 2013-05-09 ENCOUNTER — Ambulatory Visit (INDEPENDENT_AMBULATORY_CARE_PROVIDER_SITE_OTHER): Payer: Medicare Other | Admitting: Internal Medicine

## 2013-05-09 VITALS — BP 110/78 | Temp 97.5°F | Wt 151.0 lb

## 2013-05-09 DIAGNOSIS — H6122 Impacted cerumen, left ear: Secondary | ICD-10-CM

## 2013-05-09 DIAGNOSIS — H6120 Impacted cerumen, unspecified ear: Secondary | ICD-10-CM

## 2013-05-09 DIAGNOSIS — R002 Palpitations: Secondary | ICD-10-CM

## 2013-05-09 DIAGNOSIS — R0789 Other chest pain: Secondary | ICD-10-CM

## 2013-05-09 DIAGNOSIS — M069 Rheumatoid arthritis, unspecified: Secondary | ICD-10-CM

## 2013-05-09 DIAGNOSIS — E039 Hypothyroidism, unspecified: Secondary | ICD-10-CM

## 2013-05-09 DIAGNOSIS — Z8679 Personal history of other diseases of the circulatory system: Secondary | ICD-10-CM

## 2013-05-09 DIAGNOSIS — R1310 Dysphagia, unspecified: Secondary | ICD-10-CM

## 2013-05-09 LAB — TSH: TSH: 2.16 u[IU]/mL (ref 0.35–5.50)

## 2013-05-09 NOTE — Patient Instructions (Addendum)
Follow cardiology and rheumatology  Limit your sodium (Salt) intake  Return in one year for follow-up

## 2013-05-09 NOTE — Progress Notes (Signed)
Subjective:    Patient ID: Robert Rowe, male    DOB: June 20, 1918, 79 y.o.   MRN: [id number]  HPI  79 year old patient who is seen today for followup. He is followed by cardiology and rheumatology. He does have a history of rheumatoid arthritis as well as coronary artery disease. He is scheduled to see cardiology in 2 weeks. He complains of some decrease auditory acuity from the left ear. A cerumen impaction was irrigated He's also had some chest pain but associated with the use of alcohol and spicy foods. This has been relieved by nitroglycerin. [place] at the beach he had a DD visit do to the chest pain. He is scheduled for cardiology followup in 2 weeks. Denies any exertional chest pain In general doing quite well. Denies any focal neurological symptoms  Past Medical History  Diagnosis Date  . BENIGN PROSTATIC HYPERTROPHY 04/05/2007  . CHEST DISCOMFORT 04/25/2009  . GERD 12/03/2007  . HYPERLIPIDEMIA 04/05/2007  . Osteoarth NOS-Unspec 04/05/2007  . Palpitations 02/12/2010  . PEDAL EDEMA 05/01/2008  . Rheumatoid arthritis(714.0) 04/05/2007  . TRANSIENT ISCHEMIC ATTACK, HX OF 04/05/2007  . DJD (degenerative joint disease)   . Chronic kidney disease     chronic   . Hiatal hernia     History   Social History  . Marital Status: Married    Spouse Name: N/A    Number of Children: N/A  . Years of Education: N/A   Occupational History  . Not on file.   Social History Main Topics  . Smoking status: Former Smoker    Quit date: 08/13/1953  . Smokeless tobacco: Never Used  . Alcohol Use: 0.6 oz/week    1 Shots of liquor per week     Comment: patient has given up alcohol  . Drug Use: No  . Sexually Active: Not on file   Other Topics Concern  . Not on file   Social History Narrative  . No narrative on file    Past Surgical History  Procedure Laterality Date  . Hemorrhoid surgery    . Hernia repair      inguinal x5  . Cataract extraction    . Knee surgery      left  .  Shoulder surgery      left  . Prostate cryoablation      2000  . Esophagogastroduodenoscopy N/A 03/12/2013    Procedure: ESOPHAGOGASTRODUODENOSCOPY (EGD);  Surgeon: Inda Castle, MD;  Location: [facility];  Service: Endoscopy;  Laterality: N/A;  . Balloon dilation N/A 03/12/2013    Procedure: BALLOON DILATION;  Surgeon: Inda Castle, MD;  Location: [facility] ENDOSCOPY;  Service: Endoscopy;  Laterality: N/A;    Family History  Problem Relation Age of Onset  . Breast cancer Mother   . Lymphoma Brother   . Heart disease Father     Allergies  Allergen Reactions  . Sulfonamide Derivatives     Current Outpatient Prescriptions on File Prior to Visit  Medication Sig Dispense Refill  . clopidogrel (PLAVIX) 75 MG tablet Take 1 tablet (75 mg total) by mouth daily.  90 tablet  3  . folic acid (FOLVITE) 1 MG tablet Take 1 mg by mouth daily.        Marland Kitchen levothyroxine (SYNTHROID, LEVOTHROID) 50 MCG tablet TAKE 1 TABLET (50 MCG TOTAL) BY MOUTH DAILY.  30 tablet  0  . meloxicam (MOBIC) 15 MG tablet Take 1 tablet (15 mg total) by mouth daily.  90 tablet  6  . methotrexate (  RHEUMATREX) 2.5 MG tablet Take 10 mg by mouth once a week. On Mondays; Caution:Chemotherapy. Protect from light. 4 tablets weekly      . metoprolol succinate (TOPROL XL) 25 MG 24 hr tablet Take 1 tablet (25 mg total) by mouth daily.  90 tablet  3  . Multiple Vitamin (MULTIVITAMIN) tablet Take 1 tablet by mouth daily.        . nitroGLYCERIN (NITROSTAT) 0.4 MG SL tablet Place 0.4 mg under the tongue every 5 (five) minutes as needed.      . pantoprazole (PROTONIX) 40 MG tablet Take 1 tablet (40 mg total) by mouth daily.  90 tablet  6  . levothyroxine (SYNTHROID, LEVOTHROID) 100 MCG tablet Take 100 mcg by mouth daily before breakfast.       No current facility-administered medications on file prior to visit.    BP 110/78  Temp(Src) 97.5 F (36.4 C) (Oral)  Wt 151 lb (68.493 kg)  BMI 26.76 kg/m2     Review of Systems   Constitutional: Negative for fever, chills, appetite change and fatigue.  HENT: Positive for hearing loss. Negative for ear pain, congestion, sore throat, trouble swallowing, neck stiffness, dental problem, voice change and tinnitus.   Eyes: Negative for pain, discharge and visual disturbance.  Respiratory: Positive for chest tightness. Negative for cough, wheezing and stridor.   Cardiovascular: Negative for chest pain, palpitations and leg swelling.  Gastrointestinal: Negative for nausea, vomiting, abdominal pain, diarrhea, constipation, blood in stool and abdominal distention.  Genitourinary: Negative for urgency, hematuria, flank pain, discharge, difficulty urinating and genital sores.  Musculoskeletal: Negative for myalgias, back pain, joint swelling, arthralgias and gait problem.  Skin: Negative for rash.  Neurological: Negative for dizziness, syncope, speech difficulty, weakness, numbness and headaches.  Hematological: Negative for adenopathy. Does not bruise/bleed easily.  Psychiatric/Behavioral: Negative for behavioral problems and dysphoric mood. The patient is not nervous/anxious.        Objective:   Physical Exam  Constitutional: He is oriented to person, place, and time. He appears well-developed.  HENT:  Head: Normocephalic.  Right Ear: External ear normal.  Left Ear: External ear normal.  Left cerumen impaction. Canal irrigated until clear  Eyes: Conjunctivae and EOM are normal.  Neck: Normal range of motion.  Cardiovascular: Normal rate and normal heart sounds.   Pulmonary/Chest: Breath sounds normal.  Abdominal: Bowel sounds are normal.  Musculoskeletal: Normal range of motion. He exhibits no edema and no tenderness.  Neurological: He is alert and oriented to person, place, and time.  Psychiatric: He has a normal mood and affect. His behavior is normal.          Assessment & Plan:   Cerumen impaction. Canal irrigated until clear  Palpitations continue  metoprolol  Atypical chest pain. Followup cardiology continue Protonix  Hypothyroidism. We'll check a TSH   Return in one year or as needed  Follow cardiology and rheumatology

## 2013-05-16 ENCOUNTER — Telehealth: Payer: Self-pay | Admitting: Internal Medicine

## 2013-05-16 MED ORDER — PANTOPRAZOLE SODIUM 40 MG PO TBEC: 40.0000 mg | DELAYED_RELEASE_TABLET | Freq: Every day | ORAL | Status: DC

## 2013-05-16 MED ORDER — CLOPIDOGREL BISULFATE 75 MG PO TABS: 75.0000 mg | ORAL_TABLET | Freq: Every day | ORAL | Status: DC

## 2013-05-16 NOTE — Telephone Encounter (Signed)
Patient requesting 90-day supply refills on:  Plavix  75mg  Pantoprazole  40mg   He wants both to go to [place] please.

## 2013-05-16 NOTE — Telephone Encounter (Signed)
Notified Rx's were sent to [facility] as requested.

## 2013-05-23 ENCOUNTER — Ambulatory Visit (INDEPENDENT_AMBULATORY_CARE_PROVIDER_SITE_OTHER): Payer: Medicare Other | Admitting: Cardiology

## 2013-05-23 ENCOUNTER — Encounter: Payer: Self-pay | Admitting: Cardiology

## 2013-05-23 VITALS — BP 130/70 | HR 80 | Ht 63.0 in | Wt 151.0 lb

## 2013-05-23 DIAGNOSIS — K219 Gastro-esophageal reflux disease without esophagitis: Secondary | ICD-10-CM

## 2013-05-23 DIAGNOSIS — R0789 Other chest pain: Secondary | ICD-10-CM

## 2013-05-23 DIAGNOSIS — E785 Hyperlipidemia, unspecified: Secondary | ICD-10-CM

## 2013-05-23 NOTE — Assessment & Plan Note (Signed)
The patient has a history of hyperlipidemia.  He is not on any statin therapy at his age as agreed to by the patient and by Dr. Verl Blalock

## 2013-05-23 NOTE — Assessment & Plan Note (Signed)
The patient has not had any further severe chest discomfort since his episode in April.  He never has discomfort with exertion.  It is usually in association with a meal and usually in the evening.

## 2013-05-23 NOTE — Progress Notes (Signed)
Robert Rowe Date of Birth:  1918/04/12 [facility] [address] 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 We had previously seen him several months ago because of some concern over tightness in the chest relieved by sublingual nitroglycerin. In reviewing his old records he has had these complaints in the past. He does not experience any exertional chest discomfort. He develops tightness in his chest if he has an alcoholic drink . The patient gives a history of having had a large hiatal hernia for many years associated with spasms. Dr. Lyla Son has done numerous upper endoscopies and dilatations of his esophagus in the past but none in the past 10 years or so. The patient continues to be physically active. He goes to the gym to exercise on a daily basis. He rides an exercise bicycle which does not cause any chest tightness or discomfort. He has had previous stress tests which have been negative for ischemia. 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 Recently the patient has noted some mild ankle edema and he plans to cut back on his dietary salt intake. He has not been having any orthopnea and he sleeps flat. He states that his weight has been stable. His last echocardiogram was 03/03/10 which showed a normal ejection fraction of 60-65% and there was minimal aortic insufficiency and minimal mitral regurgitation.  The patient was vacationing in [place] in April 2014 and awoke one night with chest discomfort which did not respond to nitroglycerin x2.  He went to the [facility] where they observed him and did a nuclear stress test on 03/06/13 which showed no ischemia and his ejection fraction is 57%   Current Outpatient Prescriptions    Medication Sig Dispense Refill  . clopidogrel (PLAVIX) 75 MG tablet Take 1 tablet (75 mg total) by mouth daily.  90 tablet  3  . folic acid (FOLVITE) 1 MG tablet Take 1 mg by mouth daily.        Marland Kitchen levothyroxine (SYNTHROID, LEVOTHROID) 50 MCG tablet TAKE 1 TABLET (50 MCG TOTAL) BY MOUTH DAILY.  30 tablet  0  . meloxicam (MOBIC) 15 MG tablet Take 1 tablet (15 mg total) by mouth daily.  90 tablet  6  . methotrexate (RHEUMATREX) 2.5 MG tablet Take 10 mg by mouth once a week. On Mondays; Caution:Chemotherapy. Protect from light. 4 tablets weekly      . metoprolol succinate (TOPROL XL) 25 MG 24 hr tablet Take 1 tablet (25 mg total) by mouth daily.  90 tablet  3  . Multiple Vitamin (MULTIVITAMIN) tablet Take 1 tablet by mouth daily.        . nitroGLYCERIN (NITROSTAT) 0.4 MG SL tablet Place 0.4 mg under the tongue every 5 (five) minutes as needed.      . pantoprazole (PROTONIX) 40 MG tablet Take 1 tablet (40 mg total) by mouth daily.  90 tablet  3   No current facility-administered medications for this visit.    Allergies  Allergen Reactions  . Sulfonamide Derivatives     Patient Active Problem List   Diagnosis Date Noted  . Unspecified hypothyroidism 05/09/2013  . Stricture and stenosis of esophagus 03/12/2013  . Dysphagia, unspecified 02/22/2013  . Dyspnea 03/23/2011  .  PALPITATIONS 02/12/2010  . CHEST DISCOMFORT 04/25/2009  . PEDAL EDEMA 05/01/2008  . GERD 12/03/2007  . HYPERLIPIDEMIA 04/05/2007  . BENIGN PROSTATIC HYPERTROPHY 04/05/2007  . RHEUMATOID ARTHRITIS 04/05/2007  . Osteoarth NOS-Unspec 04/05/2007  . TRANSIENT ISCHEMIC ATTACK, HX OF 04/05/2007    History  Smoking status  . Former Smoker  . Quit date: 08/13/1953  Smokeless tobacco  . Never Used    History  Alcohol Use  . 0.6 oz/week  . 1 Shots of liquor per week    Comment: patient has given up alcohol    Family History  Problem Relation Age of Onset  . Breast cancer Mother   . Lymphoma Brother   . Heart  disease Father     Review of Systems: Constitutional: no fever chills diaphoresis or fatigue or change in weight.  Head and neck: no hearing loss, no epistaxis, no photophobia or visual disturbance. Respiratory: No cough, shortness of breath or wheezing. Cardiovascular: No chest pain peripheral edema, palpitations. Gastrointestinal: No abdominal distention, no abdominal pain, no change in bowel habits hematochezia or melena. Genitourinary: No dysuria, no frequency, no urgency, no nocturia. Musculoskeletal:No arthralgias, no back pain, no gait disturbance or myalgias. Neurological: No dizziness, no headaches, no numbness, no seizures, no syncope, no weakness, no tremors. Hematologic: No lymphadenopathy, no easy bruising. Psychiatric: No confusion, no hallucinations, no sleep disturbance.    Physical Exam: Filed Vitals:   05/23/13 1422  BP: 130/70  Pulse: 80   the general appearance reveals a well-developed elderly gentleman in no distress.  He is alert.The head and neck exam reveals pupils equal and reactive.  Extraocular movements are full.  There is no scleral icterus.  The mouth and pharynx are normal.  The neck is supple.  The carotids reveal no bruits.  The jugular venous pressure is normal.  The  thyroid is not enlarged.  There is no lymphadenopathy.  The chest is clear to percussion and auscultation.  There are no rales or rhonchi.  Expansion of the chest is symmetrical.  The precordium is quiet.  The first heart sound is normal.  The second heart sound is physiologically split.  There is no murmur gallop rub or click.  There is no abnormal lift or heave.  The abdomen is soft and nontender.  The bowel sounds are normal.  The liver and spleen are not enlarged.  There are no abdominal masses.  There are no abdominal bruits.  Extremities reveal good pedal pulses.  There is no phlebitis and there is mild pretibial edema bilaterally. There is no cyanosis or clubbing.  Strength is normal and  symmetrical in all extremities.  There is no lateralizing weakness.  There are no sensory deficits.  The skin is warm and dry.  There is no rash.    Assessment / Plan:  The patient is doing well.  He has peripheral edema may be dependent and related to some venous insufficiency.  He has not show any evidence of clinical CHF.  He does have grade 1 diastolic dysfunction by echo which could be contributing.  He is avoiding added salt in his food. Continue same medication.  Continue full activity.  Recheck in 4 months for office visit and EKG

## 2013-05-23 NOTE — Patient Instructions (Addendum)
Your physician recommends that you continue on your current medications as directed. Please refer to the Current Medication list given to you today.  Your physician wants you to follow-up in: 4 month OV/EKG You will receive a reminder letter in the mail two months in advance. If you don't receive a letter, please call our office to schedule the follow-up appointment.

## 2013-05-23 NOTE — Assessment & Plan Note (Signed)
Since we last saw him he saw his gastroenterologist Dr. Deatra Ina who did dilate his esophagus and since then he has felt better

## 2013-05-28 ENCOUNTER — Ambulatory Visit: Payer: Medicare Other | Admitting: Cardiology

## 2013-06-03 ENCOUNTER — Encounter: Payer: Self-pay | Admitting: Internal Medicine

## 2013-06-03 ENCOUNTER — Ambulatory Visit (INDEPENDENT_AMBULATORY_CARE_PROVIDER_SITE_OTHER)
Admission: RE | Admit: 2013-06-03 | Discharge: 2013-06-03 | Disposition: A | Discharge status: Home / Self Care | Payer: Medicare Other | Source: Ambulatory Visit | Attending: Internal Medicine | Admitting: Internal Medicine

## 2013-06-03 ENCOUNTER — Ambulatory Visit (INDEPENDENT_AMBULATORY_CARE_PROVIDER_SITE_OTHER): Payer: Medicare Other | Admitting: Internal Medicine

## 2013-06-03 ENCOUNTER — Other Ambulatory Visit: Payer: Self-pay | Admitting: Internal Medicine

## 2013-06-03 ENCOUNTER — Telehealth: Payer: Self-pay | Admitting: Internal Medicine

## 2013-06-03 VITALS — BP 110/60 | HR 94 | Temp 97.7°F | Resp 20 | Wt 150.0 lb

## 2013-06-03 DIAGNOSIS — M069 Rheumatoid arthritis, unspecified: Secondary | ICD-10-CM

## 2013-06-03 DIAGNOSIS — R1310 Dysphagia, unspecified: Secondary | ICD-10-CM

## 2013-06-03 DIAGNOSIS — J069 Acute upper respiratory infection, unspecified: Secondary | ICD-10-CM

## 2013-06-03 MED ORDER — AZITHROMYCIN 250 MG PO TABS: ORAL_TABLET | ORAL | Status: DC

## 2013-06-03 NOTE — Patient Instructions (Addendum)
Chest x-ray as discussed  Acute bronchitis symptoms for less than 10 days are generally not helped by antibiotics.  Take over-the-counter expectorants and cough medications such as  Mucinex DM.  Call if there is no improvement in 5 to 7 days or if he developed worsening cough, fever, or new symptoms, such as shortness of breath or chest pain.

## 2013-06-03 NOTE — Telephone Encounter (Signed)
appt made/kh 

## 2013-06-03 NOTE — Telephone Encounter (Signed)
Pt would like to see Dr Raliegh Ip. If he can see earlier than 4:30pm, pt would like a call.

## 2013-06-03 NOTE — Telephone Encounter (Signed)
I have no sooner appointments, he can see someone else if he wants to.

## 2013-06-03 NOTE — Progress Notes (Signed)
Subjective:    Patient ID: Robert Rowe, male    DOB: Apr 08, 1918, 79 y.o.   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 More recently has been expectorating some green sputum. There's been no fever or chills but he has a general sense of unwellness.  Past Medical History  Diagnosis Date  . BENIGN PROSTATIC HYPERTROPHY 04/05/2007  . CHEST DISCOMFORT 04/25/2009  . GERD 12/03/2007  . HYPERLIPIDEMIA 04/05/2007  . Osteoarth NOS-Unspec 04/05/2007  . Palpitations 02/12/2010  . PEDAL EDEMA 05/01/2008  . Rheumatoid arthritis(714.0) 04/05/2007  . TRANSIENT ISCHEMIC ATTACK, HX OF 04/05/2007  . DJD (degenerative joint disease)   . Chronic kidney disease     chronic   . Hiatal hernia     History   Social History  . Marital Status: Married    Spouse Name: N/A    Number of Children: N/A  . Years of Education: N/A   Occupational History  . Not on file.   Social History Main Topics  . Smoking status: Former Smoker    Quit date: 08/13/1953  . Smokeless tobacco: Never Used  . Alcohol Use: 0.6 oz/week    1 Shots of liquor per week     Comment: patient has given up alcohol  . Drug Use: No  . Sexually Active: Not on file   Other Topics Concern  . Not on file   Social History Narrative  . No narrative on file    Past Surgical History  Procedure Laterality Date  . Hemorrhoid surgery    . Hernia repair      inguinal x5  . Cataract extraction    . Knee surgery      left  . Shoulder surgery      left  . Prostate cryoablation      2000  . Esophagogastroduodenoscopy N/A 03/12/2013    Procedure: ESOPHAGOGASTRODUODENOSCOPY (EGD);  Surgeon: Inda Castle, MD;  Location: [facility];  Service: Endoscopy;  Laterality: N/A;  . Balloon dilation N/A 03/12/2013    Procedure: BALLOON DILATION;  Surgeon: Inda Castle, MD;  Location: [facility]  ENDOS[facility];  Service: Endoscopy;  Laterality: N/A;    Family History  Problem Relation Age of Onset  . Breast cancer Mother   . Lymphoma Brother   . Heart disease Father     Allergies  Allergen Reactions  . Sulfonamide Derivatives     Current Outpatient Prescriptions on File Prior to Visit  Medication Sig Dispense Refill  . clopidogrel (PLAVIX) 75 MG tablet Take 1 tablet (75 mg total) by mouth daily.  90 tablet  3  . folic acid (FOLVITE) 1 MG tablet Take 1 mg by mouth daily.        Marland Kitchen levothyroxine (SYNTHROID, LEVOTHROID) 50 MCG tablet TAKE 1 TABLET (50 MCG TOTAL) BY MOUTH DAILY. **NEEDS APPT  30 tablet  2  . meloxicam (MOBIC) 15 MG tablet Take 1 tablet (15 mg total) by mouth daily.  90 tablet  6  . methotrexate (RHEUMATREX) 2.5 MG tablet Take 10 mg by mouth once a week. On Mondays; Caution:Chemotherapy. Protect from light. 4 tablets weekly      . metoprolol succinate (TOPROL XL) 25 MG 24 hr tablet Take 1 tablet (25 mg total) by mouth daily.  90 tablet  3  . Multiple Vitamin (MULTIVITAMIN) tablet Take 1 tablet by mouth daily.        Marland Kitchen  nitroGLYCERIN (NITROSTAT) 0.4 MG SL tablet Place 0.4 mg under the tongue every 5 (five) minutes as needed.      . pantoprazole (PROTONIX) 40 MG tablet Take 1 tablet (40 mg total) by mouth daily.  90 tablet  3   No current facility-administered medications on file prior to visit.    BP 110/60  Pulse 94  Temp(Src) 97.7 F (36.5 C) (Oral)  Resp 20  Wt 150 lb (68.04 kg)  BMI 26.58 kg/m2  SpO2 95%       Review of Systems  Constitutional: Positive for activity change, appetite change and fatigue. Negative for fever, chills and diaphoresis.  HENT: Negative for hearing loss, ear pain, congestion, sore throat, trouble swallowing, neck stiffness, dental problem, voice change and tinnitus.   Eyes: Negative for pain, discharge and visual disturbance.  Respiratory: Positive for cough. Negative for chest tightness, wheezing and stridor.    Cardiovascular: Negative for chest pain, palpitations and leg swelling.  Gastrointestinal: Negative for nausea, vomiting, abdominal pain, diarrhea, constipation, blood in stool and abdominal distention.  Genitourinary: Negative for urgency, hematuria, flank pain, discharge, difficulty urinating and genital sores.  Musculoskeletal: Negative for myalgias, back pain, joint swelling, arthralgias and gait problem.  Skin: Negative for rash.  Neurological: Positive for weakness. Negative for dizziness, syncope, speech difficulty, numbness and headaches.  Hematological: Negative for adenopathy. Does not bruise/bleed easily.  Psychiatric/Behavioral: Negative for behavioral problems and dysphoric mood. The patient is not nervous/anxious.        Objective:   Physical Exam  Constitutional: He is oriented to person, place, and time. He appears well-developed. No distress.  Appears unwell but in no acute distress Afebrile  HENT:  Head: Normocephalic.  Right Ear: External ear normal.  Left Ear: External ear normal.  Eyes: Conjunctivae and EOM are normal.  Neck: Normal range of motion.  Cardiovascular: Normal rate and normal heart sounds.   Pulmonary/Chest: Effort normal. He has rales.  A few bibasilar crackles  No increased work of breathing O2 saturation 95%  Abdominal: Bowel sounds are normal.  Musculoskeletal: Normal range of motion. He exhibits no edema and no tenderness.  Neurological: He is alert and oriented to person, place, and time.  Psychiatric: He has a normal mood and affect. His behavior is normal.          Assessment & Plan:   Suspect viral URI with cough. We'll check a chest x-ray to rule out community-acquired pneumonia. We'll treat symptomatically with expectorants Arthritis Chronic immunosuppression

## 2013-06-03 NOTE — Telephone Encounter (Signed)
Pt is coughing and states he aches and hurts all over.  The worst he has felt in a  long time!! Can you work in today?

## 2013-06-05 ENCOUNTER — Telehealth: Payer: Self-pay | Admitting: Internal Medicine

## 2013-06-05 NOTE — Telephone Encounter (Signed)
[name] requesting a call back with the chest xray results from [facility], 7/14. Thank you.

## 2013-06-05 NOTE — Telephone Encounter (Signed)
Pt calling for Chest x-ray results. Please advise

## 2013-06-20 ENCOUNTER — Encounter: Payer: Self-pay | Admitting: Gastroenterology

## 2013-06-25 ENCOUNTER — Telehealth: Payer: Self-pay | Admitting: Gastroenterology

## 2013-06-25 NOTE — Telephone Encounter (Signed)
Pt received a recall letter for a colon and at his age the pt does not think he needs to have this done. Dr. Deatra Ina notified.

## 2013-07-07 ENCOUNTER — Other Ambulatory Visit: Payer: Self-pay | Admitting: Internal Medicine

## 2013-07-19 ENCOUNTER — Telehealth: Payer: Self-pay | Admitting: Gastroenterology

## 2013-07-19 NOTE — Telephone Encounter (Signed)
Pt states he is still having some difficulty swallowing and some pain. Would like for Dr. Deatra Ina to call him on Tuesday 07/23/13. Pt states to call either his cell phone number [phone] or [phone]. Message sent to Dr. Deatra Ina.

## 2013-07-23 NOTE — Telephone Encounter (Signed)
Do I need to call the patient? Please advise.

## 2013-07-23 NOTE — Telephone Encounter (Signed)
Having some throat burning when he swallows, particularly alcohol or ice cream.  Burning is immediately improved with antacids.  I suggested that he increase protonix to before breakfast and dinner.  If symptoms do not improve I suggested that he have a direct ENT examination.

## 2013-07-23 NOTE — Telephone Encounter (Signed)
-  The patient who claims that he is having some burning when he swallows alcohol or ice cream.  I suggested that he increase Protonix to twice a day.  If burning continues I have recommended that he undergo ENT examination

## 2013-07-29 ENCOUNTER — Encounter: Payer: Self-pay | Admitting: Internal Medicine

## 2013-07-29 ENCOUNTER — Ambulatory Visit (INDEPENDENT_AMBULATORY_CARE_PROVIDER_SITE_OTHER): Payer: Medicare Other | Admitting: Internal Medicine

## 2013-07-29 VITALS — BP 110/62 | HR 72 | Resp 20 | Wt 151.0 lb

## 2013-07-29 DIAGNOSIS — K222 Esophageal obstruction: Secondary | ICD-10-CM

## 2013-07-29 DIAGNOSIS — R0602 Shortness of breath: Secondary | ICD-10-CM

## 2013-07-29 DIAGNOSIS — I499 Cardiac arrhythmia, unspecified: Secondary | ICD-10-CM

## 2013-07-29 DIAGNOSIS — E039 Hypothyroidism, unspecified: Secondary | ICD-10-CM

## 2013-07-29 DIAGNOSIS — R002 Palpitations: Secondary | ICD-10-CM

## 2013-07-29 NOTE — Patient Instructions (Addendum)
Moderate caffeine use  Call or return to clinic prn if these symptoms worsen or fail to improve as anticipated.

## 2013-07-29 NOTE — Progress Notes (Signed)
Subjective:    Patient ID: Robert Rowe, male    DOB: 19-Feb-1918, 79 y.o.   MRN: [id number]  HPI  79 year old patient who is seen today do to increased frequency of palpitations. This has been a chronic problem and he has had the frequent PVCs in the past. He denies any ischemic chest pain but has had some chest pain associated with alcohol and ice cream ingestion. He normally drinks 3 cups of coffee daily 2 in the morning and again later in the evening. Denies any exertional chest pain  Past Medical History  Diagnosis Date  . BENIGN PROSTATIC HYPERTROPHY 04/05/2007  . CHEST DISCOMFORT 04/25/2009  . GERD 12/03/2007  . HYPERLIPIDEMIA 04/05/2007  . Osteoarth NOS-Unspec 04/05/2007  . Palpitations 02/12/2010  . PEDAL EDEMA 05/01/2008  . Rheumatoid arthritis(714.0) 04/05/2007  . TRANSIENT ISCHEMIC ATTACK, HX OF 04/05/2007  . DJD (degenerative joint disease)   . Chronic kidney disease     chronic   . Hiatal hernia     History   Social History  . Marital Status: Married    Spouse Name: N/A    Number of Children: N/A  . Years of Education: N/A   Occupational History  . Not on file.   Social History Main Topics  . Smoking status: Former Smoker    Quit date: 08/13/1953  . Smokeless tobacco: Never Used  . Alcohol Use: 0.6 oz/week    1 Shots of liquor per week     Comment: patient has given up alcohol  . Drug Use: No  . Sexual Activity: Not on file   Other Topics Concern  . Not on file   Social History Narrative  . No narrative on file    Past Surgical History  Procedure Laterality Date  . Hemorrhoid surgery    . Hernia repair      inguinal x5  . Cataract extraction    . Knee surgery      left  . Shoulder surgery      left  . Prostate cryoablation      2000  . Esophagogastroduodenoscopy N/A 03/12/2013    Procedure: ESOPHAGOGASTRODUODENOSCOPY (EGD);  Surgeon: Inda Castle, MD;  Location: [facility];  Service: Endoscopy;  Laterality: N/A;  . Balloon dilation N/A  03/12/2013    Procedure: BALLOON DILATION;  Surgeon: Inda Castle, MD;  Location: [facility];  Service: Endoscopy;  Laterality: N/A;    Family History  Problem Relation Age of Onset  . Breast cancer Mother   . Lymphoma Brother   . Heart disease Father     Allergies  Allergen Reactions  . Sulfonamide Derivatives     Current Outpatient Prescriptions on File Prior to Visit  Medication Sig Dispense Refill  . azithromycin (ZITHROMAX) 250 MG tablet 2 tablets Daily for 3 consecutive days  6 tablet  0  . clopidogrel (PLAVIX) 75 MG tablet Take 1 tablet (75 mg total) by mouth daily.  90 tablet  3  . folic acid (FOLVITE) 1 MG tablet Take 1 mg by mouth daily.        Marland Kitchen levothyroxine (SYNTHROID, LEVOTHROID) 50 MCG tablet TAKE 1 TABLET (50 MCG TOTAL) BY MOUTH DAILY. **NEEDS APPT  30 tablet  2  . meloxicam (MOBIC) 15 MG tablet TAKE 1 TABLET (15 MG TOTAL) BY MOUTH DAILY.  90 tablet  1  . methotrexate (RHEUMATREX) 2.5 MG tablet Take 10 mg by mouth once a week. On Mondays; Caution:Chemotherapy. Protect from light. 4 tablets weekly      .  metoprolol succinate (TOPROL XL) 25 MG 24 hr tablet Take 1 tablet (25 mg total) by mouth daily.  90 tablet  3  . Multiple Vitamin (MULTIVITAMIN) tablet Take 1 tablet by mouth daily.        . nitroGLYCERIN (NITROSTAT) 0.4 MG SL tablet Place 0.4 mg under the tongue every 5 (five) minutes as needed.      . pantoprazole (PROTONIX) 40 MG tablet Take 1 tablet (40 mg total) by mouth daily.  90 tablet  3   No current facility-administered medications on file prior to visit.    BP 110/62  Pulse 72  Resp 20  Wt 151 lb (68.493 kg)  BMI 26.76 kg/m2       Review of Systems  Constitutional: Negative for fever, chills, appetite change and fatigue.  HENT: Negative for hearing loss, ear pain, congestion, sore throat, trouble swallowing, neck stiffness, dental problem, voice change and tinnitus.   Eyes: Negative for pain, discharge and visual disturbance.   Respiratory: Negative for cough, chest tightness, wheezing and stridor.   Cardiovascular: Positive for palpitations. Negative for chest pain and leg swelling.  Gastrointestinal: Negative for nausea, vomiting, abdominal pain, diarrhea, constipation, blood in stool and abdominal distention.  Genitourinary: Negative for urgency, hematuria, flank pain, discharge, difficulty urinating and genital sores.  Musculoskeletal: Negative for myalgias, back pain, joint swelling, arthralgias and gait problem.  Skin: Negative for rash.  Neurological: Negative for dizziness, syncope, speech difficulty, weakness, numbness and headaches.  Hematological: Negative for adenopathy. Does not bruise/bleed easily.  Psychiatric/Behavioral: Negative for behavioral problems and dysphoric mood. The patient is not nervous/anxious.        Objective:   Physical Exam  Constitutional: He is oriented to person, place, and time. He appears well-developed.  HENT:  Head: Normocephalic.  Right Ear: External ear normal.  Left Ear: External ear normal.  Eyes: Conjunctivae and EOM are normal.  Neck: Normal range of motion.  Cardiovascular: Normal rate and normal heart sounds.   Slight irregularity  Pulmonary/Chest: Effort normal.  A few crackles and diminished breath sounds at the left base  Abdominal: Bowel sounds are normal.  Musculoskeletal: Normal range of motion. He exhibits no edema and no tenderness.  Neurological: He is alert and oriented to person, place, and time.  Psychiatric: He has a normal mood and affect. His behavior is normal.          Assessment & Plan:   Palpitations. Patient has noted increased frequency of ectopics. EKG today reveals occasional PVCs which has been an issue in the past. No acute changes noted Patient does have a history of hypothyroidism

## 2013-08-14 ENCOUNTER — Telehealth: Payer: Self-pay | Admitting: Internal Medicine

## 2013-08-14 MED ORDER — CLOPIDOGREL BISULFATE 75 MG PO TABS: 75.0000 mg | ORAL_TABLET | Freq: Every day | ORAL | Status: DC

## 2013-08-14 MED ORDER — PANTOPRAZOLE SODIUM 40 MG PO TBEC: 40.0000 mg | DELAYED_RELEASE_TABLET | Freq: Every day | ORAL | Status: DC

## 2013-08-14 NOTE — Telephone Encounter (Signed)
Rx's sent to pharmacy.  

## 2013-08-14 NOTE — Telephone Encounter (Signed)
New pharm called for pt. Needs updated rx pantoprazole (PROTONIX) 40 MG tablet 1/ day clopidogrel (PLAVIX) 75 MG tablet 1/ day  [facility] [phone] Fax [phone]

## 2013-08-16 ENCOUNTER — Telehealth: Payer: Self-pay | Admitting: Internal Medicine

## 2013-08-16 MED ORDER — NITROGLYCERIN 0.4 MG SL SUBL: 0.4000 mg | SUBLINGUAL_TABLET | SUBLINGUAL | Status: DC | PRN

## 2013-08-16 NOTE — Telephone Encounter (Signed)
Rx sent to pharmacy   

## 2013-08-16 NOTE — Telephone Encounter (Signed)
Pt is requesting a refill of his nitroGLYCERIN (NITROSTAT) 0.4 MG SL tablet. He is out, and would like to keep some on hand. Please assist. Fax to [phone]

## 2013-08-21 ENCOUNTER — Telehealth: Payer: Self-pay | Admitting: *Deleted

## 2013-08-21 NOTE — Telephone Encounter (Signed)
Left message to call back  

## 2013-08-22 ENCOUNTER — Encounter: Payer: Self-pay | Admitting: Cardiology

## 2013-08-22 ENCOUNTER — Ambulatory Visit (INDEPENDENT_AMBULATORY_CARE_PROVIDER_SITE_OTHER): Payer: Medicare Other | Admitting: Cardiology

## 2013-08-22 VITALS — BP 162/93 | HR 87 | Ht 63.0 in | Wt 148.0 lb

## 2013-08-22 DIAGNOSIS — R0789 Other chest pain: Secondary | ICD-10-CM

## 2013-08-22 DIAGNOSIS — I493 Ventricular premature depolarization: Secondary | ICD-10-CM

## 2013-08-22 DIAGNOSIS — I4949 Other premature depolarization: Secondary | ICD-10-CM

## 2013-08-22 DIAGNOSIS — K219 Gastro-esophageal reflux disease without esophagitis: Secondary | ICD-10-CM

## 2013-08-22 NOTE — Assessment & Plan Note (Signed)
The patient is having frequent PVCs and some episodes of bigeminy.  Presently he has been taking Toprol 25 mg each night.  He has been taking in only a small amount of caffeine in the form of tea or coffee.  He does not drink much alcohol because it causes chest pain secondary to esophageal irritation.  The PVCs have not been associated with any dizziness or syncope or chest pain

## 2013-08-22 NOTE — Telephone Encounter (Signed)
Spoke with patient yesterday, seen today

## 2013-08-22 NOTE — Assessment & Plan Note (Signed)
The patient GERD has been stable.  He avoids alcohol which triggers chest discomfort.  He has had his esophagus dilated by Dr. Deatra Ina.  For symptomatic reflux he takes liquid antacid and extra Protonix.

## 2013-08-22 NOTE — Patient Instructions (Addendum)
INCREASE YOUR TOPROL (METOPROLOL) TO 25 MG IN THE AM AND 1/2 TABLET IN THE PM  DOUBLE CHECK THE METOPROLOL DOSE AND Harlon Ditty [id number]  Keep your November appointment

## 2013-08-22 NOTE — Assessment & Plan Note (Signed)
The patient has not been experiencing any exertional chest pain or angina.  His last nuclear stress test was at [facility] in April 2014 and showed no ischemia and showed normal LV function.  The patient does take sublingual nitroglycerin for his esophageal spasm which is associated with his large hiatal hernia and his reflux.

## 2013-08-22 NOTE — Progress Notes (Signed)
Robert Rowe Date of Birth:  03-Dec-1917 [facility] [address] 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  He comes in today because of concern over increasing PVCs.  He does not feel the PVCs in his chest but feels that when he checks his radial pulse .he has a prior history of known PVCs We had previously seen him several months ago because of some concern over tightness in the chest relieved by sublingual nitroglycerin. In reviewing his old records he has had these complaints in the past. He does not experience any exertional chest discomfort. He develops tightness in his chest if he has an alcoholic drink . The patient gives a history of having had a large hiatal hernia for many years associated with spasms. Dr. Lyla Son has done numerous upper endoscopies and dilatations of his esophagus in the past but none in the past 10 years or so. The patient continues to be physically active. He goes to the gym to exercise on a daily basis. He rides an exercise bicycle which does not cause any chest tightness or discomfort. He has had previous stress tests which have been negative for ischemia. 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 Recently the patient has noted some mild ankle edema and he plans to cut back on his dietary salt intake. He has not been having any orthopnea and he sleeps flat. He states that his weight has been stable. His last echocardiogram was 03/03/10 which showed a normal ejection fraction of 60-65% and there was minimal aortic insufficiency and minimal mitral regurgitation.  The patient was vacationing in [place] in April 2014 and awoke one night with chest discomfort which did not respond to nitroglycerin x2.  He went to the grand [facility] where they observed him and did a nuclear stress test on 03/06/13 which showed no ischemia and his ejection fraction is 57% The patient continues to have good exercise tolerance.  He played golf yesterday at age 79.   Current Outpatient Prescriptions  Medication Sig Dispense Refill  . clopidogrel (PLAVIX) 75 MG tablet Take 1 tablet (75 mg total) by mouth daily.  90 tablet  3  . folic acid (FOLVITE) 1 MG tablet Take 1 mg by mouth daily.        Marland Kitchen levothyroxine (SYNTHROID, LEVOTHROID) 50 MCG tablet TAKE 1 TABLET (50 MCG TOTAL) BY MOUTH DAILY. **NEEDS APPT  30 tablet  2  . meloxicam (MOBIC) 15 MG tablet TAKE 1 TABLET (15 MG TOTAL) BY MOUTH DAILY.  90 tablet  1  . methotrexate (RHEUMATREX) 2.5 MG tablet Take 10 mg by mouth once a week. On Mondays; Caution:Chemotherapy. Protect from light. 4 tablets weekly      . metoprolol succinate (TOPROL-XL) 25 MG 24 hr tablet Take 25 mg by mouth as directed. 1 IN THE MORNING AND 1/2 IN THE EVENING      . Multiple Vitamin (MULTIVITAMIN) tablet Take 1 tablet by mouth daily.        . nitroGLYCERIN (NITROSTAT) 0.4 MG SL tablet Place 1 tablet (0.4 mg total) under the tongue every 5 (five) minutes as needed.  15 tablet  1  . pantoprazole (PROTONIX) 40 MG tablet Take 1 tablet (  40 mg total) by mouth daily.  90 tablet  3   No current facility-administered medications for this visit.    Allergies  Allergen Reactions  . Sulfonamide Derivatives     Patient Active Problem List   Diagnosis Date Noted  . Unspecified hypothyroidism 05/09/2013  . Stricture and stenosis of esophagus 03/12/2013  . Dysphagia, unspecified 02/22/2013  . Dyspnea 03/23/2011  . PALPITATIONS 02/12/2010  . CHEST DISCOMFORT 04/25/2009  . PEDAL EDEMA 05/01/2008  . GERD 12/03/2007  . HYPERLIPIDEMIA 04/05/2007  . BENIGN PROSTATIC HYPERTROPHY 04/05/2007  . RHEUMATOID ARTHRITIS 04/05/2007  . Osteoarth NOS-Unspec 04/05/2007  . TRANSIENT ISCHEMIC ATTACK, HX OF 04/05/2007     History  Smoking status  . Former Smoker  . Quit date: 08/13/1953  Smokeless tobacco  . Never Used    History  Alcohol Use  . 0.6 oz/week  . 1 Shots of liquor per week    Comment: patient has given up alcohol    Family History  Problem Relation Age of Onset  . Breast cancer Mother   . Lymphoma Brother   . Heart disease Father     Review of Systems: Constitutional: no fever chills diaphoresis or fatigue or change in weight.  Head and neck: no hearing loss, no epistaxis, no photophobia or visual disturbance. Respiratory: No cough, shortness of breath or wheezing. Cardiovascular: No chest pain peripheral edema, palpitations. Gastrointestinal: No abdominal distention, no abdominal pain, no change in bowel habits hematochezia or melena. Genitourinary: No dysuria, no frequency, no urgency, no nocturia. Musculoskeletal:No arthralgias, no back pain, no gait disturbance or myalgias. Neurological: No dizziness, no headaches, no numbness, no seizures, no syncope, no weakness, no tremors. Hematologic: No lymphadenopathy, no easy bruising. Psychiatric: No confusion, no hallucinations, no sleep disturbance.    Physical Exam: Filed Vitals:   08/22/13 1039  BP: 162/93  Pulse: 87   the general appearance reveals a well-developed elderly gentleman in no distress.  He is alert.The head and neck exam reveals pupils equal and reactive.  Extraocular movements are full.  There is no scleral icterus.  The mouth and pharynx are normal.  The neck is supple.  The carotids reveal no bruits.  The jugular venous pressure is normal.  The  thyroid is not enlarged.  There is no lymphadenopathy.  The chest is clear to percussion and auscultation.  There are no rales or rhonchi.  Expansion of the chest is symmetrical.  The precordium is quiet.  Frequent PVCs are present. The first heart sound is normal.  The second heart sound is physiologically split.  There is no murmur gallop rub or click.  There is  no abnormal lift or heave.  The abdomen is soft and nontender.  The bowel sounds are normal.  The liver and spleen are not enlarged.  There are no abdominal masses.  There are no abdominal bruits.  Extremities reveal good pedal pulses.  There is no phlebitis and there is mild pretibial edema bilaterally. There is no cyanosis or clubbing.  Strength is normal and symmetrical in all extremities.  There is no lateralizing weakness.  There are no sensory deficits.  The skin is warm and dry.  There is no rash.  EKG today shows normal sinus rhythm with frequent PVCs and bigeminy  Assessment / Plan:  His PVCs are not causing any hemodynamic compromise or angina pectoris symptoms.  Nonetheless they bother him.  We will try increasing his beta blocker slightly.  We will have him take Toprol 25 mg in  the morning and 12.5 mg in the evening. Continue other medicines as is and be rechecked at his regular office visit in November. His blood pressure is labile and was elevated on arrival but subsequently prior to leaving the office his blood pressure was down to 138/76

## 2013-08-29 ENCOUNTER — Telehealth: Payer: Self-pay | Admitting: *Deleted

## 2013-08-29 NOTE — Telephone Encounter (Signed)
Spoke with [name], he states that since increasing Metoprolol has had caused him to be very weak. Does not check his blood pressure at home, PVC's no better. Patient would like to go back to previous dose of Metoprolol and see how he does. Advised ok but to call Wednesday of next week with update. If no better or worse call before then. Patient verbalized understanding. Will forward to  Dr. [name] for review

## 2013-09-01 NOTE — Telephone Encounter (Signed)
Agree with advice given

## 2013-09-10 ENCOUNTER — Telehealth: Payer: Self-pay | Admitting: Internal Medicine

## 2013-09-10 NOTE — Telephone Encounter (Signed)
Pt would a callback from either you or Dr. Raliegh Ip.  He needs to talk to someone about his wife.  Thanks a bunch  [employer]

## 2013-09-12 ENCOUNTER — Telehealth: Payer: Self-pay | Admitting: Internal Medicine

## 2013-09-12 NOTE — Telephone Encounter (Signed)
Pt called again requesting a call from Dr. Raliegh Ip about his wife and something that he faxed to [place], said he rather discuss it with Dr.

## 2013-09-12 NOTE — Telephone Encounter (Signed)
Pt called again requesting a call from Dr. Raliegh Ip about his wife and something that he faxed to [place], said he rather discuss it with Dr. Raliegh Ip

## 2013-10-08 ENCOUNTER — Encounter: Payer: Self-pay | Admitting: Cardiology

## 2013-10-08 ENCOUNTER — Ambulatory Visit (INDEPENDENT_AMBULATORY_CARE_PROVIDER_SITE_OTHER): Payer: Medicare Other | Admitting: Cardiology

## 2013-10-08 VITALS — BP 120/68 | HR 87 | Ht 63.0 in | Wt 149.0 lb

## 2013-10-08 DIAGNOSIS — I4949 Other premature depolarization: Secondary | ICD-10-CM

## 2013-10-08 DIAGNOSIS — R0789 Other chest pain: Secondary | ICD-10-CM

## 2013-10-08 DIAGNOSIS — K219 Gastro-esophageal reflux disease without esophagitis: Secondary | ICD-10-CM

## 2013-10-08 DIAGNOSIS — I493 Ventricular premature depolarization: Secondary | ICD-10-CM

## 2013-10-08 NOTE — Assessment & Plan Note (Signed)
The patient has significant symptoms of reflux and chest pain if he drinks an alcoholic drink.  He will often pretreat himself with Protonix and liquid antacid.  He will occasionally take sublingual nitroglycerin if the antacid he has not effective.

## 2013-10-08 NOTE — Assessment & Plan Note (Signed)
He denies any exertional chest discomfort.  His only discomfort comes after drinking alcohol drinks.

## 2013-10-08 NOTE — Patient Instructions (Signed)
Your physician recommends that you continue on your current medications as directed. Please refer to the Current Medication list given to you today.  Your physician recommends that you schedule a follow-up appointment in: 4 month ov/ekg 

## 2013-10-08 NOTE — Assessment & Plan Note (Signed)
EKG today shows normal sinus rhythm with frequent unifocal PVCs.  The PVCs appear to be benign.  The PVCs do not bother him or cause him any symptoms.  He still drinks 2 cups of regular coffee in the morning.  If his PVCs worsen, we can consider cutting back on his caffeine intake.

## 2013-10-08 NOTE — Progress Notes (Signed)
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  He comes in today for cardiology followup.  He has a history of frequent PVCs.  He does not feel the PVCs in his chest but feels that when he checks his radial pulse .he has a prior history of known PVCs We had previously seen him several months ago because of some concern over tightness in the chest relieved by sublingual nitroglycerin. In reviewing his old records he has had these complaints in the past. He does not experience any exertional chest discomfort. He develops tightness in his chest if he has an alcoholic drink . The patient gives a history of having had a large hiatal hernia for many years associated with spasms. Dr. Lyla Son has done numerous upper endoscopies and dilatations of his esophagus in the past but none in the past 10 years or so. The patient continues to be physically active. He goes to the gym to exercise on a daily basis. He rides an exercise bicycle which does not cause any chest tightness or discomfort. He has had previous stress tests which have been negative for ischemia. 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 Recently the patient has noted some mild ankle edema and he plans to cut back on his dietary salt intake. He has not been having any orthopnea and he sleeps flat. He states that his weight has been stable. His last echocardiogram was 03/03/10 which showed a normal ejection fraction of 60-65% and there was minimal aortic insufficiency and minimal mitral regurgitation.  The patient was vacationing in [place] in April 2014 and awoke one night with chest discomfort which did not respond to nitroglycerin x2.  He went to the  grand [facility] where they observed him and did a nuclear stress test on 03/06/13 which showed no ischemia and his ejection fraction is 57% The patient continues to have good exercise tolerance.  He still plays golf when the weather permits.   Current Outpatient Prescriptions  Medication Sig Dispense Refill  . clopidogrel (PLAVIX) 75 MG tablet Take 1 tablet (75 mg total) by mouth daily.  90 tablet  3  . folic acid (FOLVITE) 1 MG tablet Take 1 mg by mouth daily.        Marland Kitchen levothyroxine (SYNTHROID, LEVOTHROID) 50 MCG tablet TAKE 1 TABLET (50 MCG TOTAL) BY MOUTH DAILY. **NEEDS APPT  30 tablet  2  . meloxicam (MOBIC) 15 MG tablet TAKE 1 TABLET (15 MG TOTAL) BY MOUTH DAILY.  90 tablet  1  . methotrexate (RHEUMATREX) 2.5 MG tablet Take 10 mg by mouth once a week. On Mondays; Caution:Chemotherapy. Protect from light. 4 tablets weekly      . metoprolol succinate (TOPROL-XL) 25 MG 24 hr tablet Take 25 mg by mouth at bedtime.       . Multiple Vitamin (MULTIVITAMIN) tablet Take 1 tablet by mouth daily.        . nitroGLYCERIN (NITROSTAT) 0.4 MG SL tablet Place 1 tablet (0.4 mg total) under the tongue every 5 (five) minutes as needed.  15 tablet  1  . pantoprazole (PROTONIX) 40 MG tablet Take 1 tablet (40 mg total)  by mouth daily.  90 tablet  3   No current facility-administered medications for this visit.    Allergies  Allergen Reactions  . Sulfonamide Derivatives     Patient Active Problem List   Diagnosis Date Noted  . Frequent PVCs 08/22/2013  . Unspecified hypothyroidism 05/09/2013  . Stricture and stenosis of esophagus 03/12/2013  . Dysphagia, unspecified 02/22/2013  . Dyspnea 03/23/2011  . PALPITATIONS 02/12/2010  . CHEST DISCOMFORT 04/25/2009  . PEDAL EDEMA 05/01/2008  . GERD 12/03/2007  . HYPERLIPIDEMIA 04/05/2007  . BENIGN PROSTATIC HYPERTROPHY 04/05/2007  . RHEUMATOID ARTHRITIS 04/05/2007  . Osteoarth NOS-Unspec 04/05/2007  . TRANSIENT ISCHEMIC ATTACK, HX OF  04/05/2007    History  Smoking status  . Former Smoker  . Quit date: 08/13/1953  Smokeless tobacco  . Never Used    History  Alcohol Use  . 0.6 oz/week  . 1 Shots of liquor per week    Comment: patient has given up alcohol    Family History  Problem Relation Age of Onset  . Breast cancer Mother   . Lymphoma Brother   . Heart disease Father     Review of Systems: Constitutional: no fever chills diaphoresis or fatigue or change in weight.  Head and neck: no hearing loss, no epistaxis, no photophobia or visual disturbance. Respiratory: No cough, shortness of breath or wheezing. Cardiovascular: No chest pain peripheral edema, palpitations. Gastrointestinal: No abdominal distention, no abdominal pain, no change in bowel habits hematochezia or melena. Genitourinary: No dysuria, no frequency, no urgency, no nocturia. Musculoskeletal:No arthralgias, no back pain, no gait disturbance or myalgias. Neurological: No dizziness, no headaches, no numbness, no seizures, no syncope, no weakness, no tremors. Hematologic: No lymphadenopathy, no easy bruising. Psychiatric: No confusion, no hallucinations, no sleep disturbance.    Physical Exam: Filed Vitals:   10/08/13 1511  BP: 120/68  Pulse: 87   the general appearance reveals a well-developed elderly gentleman in no distress.  He is alert.The head and neck exam reveals pupils equal and reactive.  Extraocular movements are full.  There is no scleral icterus.  The mouth and pharynx are normal.  The neck is supple.  The carotids reveal no bruits.  The jugular venous pressure is normal.  The  thyroid is not enlarged.  There is no lymphadenopathy.  The chest is clear to percussion and auscultation.  There are no rales or rhonchi.  Expansion of the chest is symmetrical.  The precordium is quiet.  Frequent PVCs are present. The first heart sound is normal.  The second heart sound is physiologically split.  There is no murmur gallop rub or click.   There is no abnormal lift or heave.  The abdomen is soft and nontender.  The bowel sounds are normal.  The liver and spleen are not enlarged.  There are no abdominal masses.  There are no abdominal bruits.  Extremities reveal good pedal pulses.  There is no phlebitis and there is mild pretibial edema bilaterally. There is no cyanosis or clubbing.  Strength is normal and symmetrical in all extremities.  There is no lateralizing weakness.  There are no sensory deficits.  The skin is warm and dry.  There is no rash.  EKG today shows normal sinus rhythm with frequent PVCs and bigeminy  Assessment / Plan:  The patient will continue Toprol-XL 25 mg daily.  He was not able to tolerate a higher dose because of worsening malaise and fatigue. Continue same medication.  Recheck in 4 months for office  visit and EKG.  He will be spending the coldest months of winter down in [place].

## 2013-11-25 ENCOUNTER — Other Ambulatory Visit: Payer: Self-pay | Admitting: *Deleted

## 2013-11-25 MED ORDER — FLUTICASONE PROPIONATE 50 MCG/ACT NA SUSP
2.0000 | Freq: Every day | NASAL | Status: DC
Start: 2013-11-25 — End: 2014-03-19

## 2013-11-25 NOTE — Telephone Encounter (Signed)
Pt notified Rx sent to pharmacy for Flonase nasal spray.

## 2013-12-16 ENCOUNTER — Telehealth: Payer: Self-pay | Admitting: Internal Medicine

## 2013-12-16 MED ORDER — LEVOTHYROXINE SODIUM 50 MCG PO TABS: ORAL_TABLET | ORAL | Status: DC

## 2013-12-16 NOTE — Telephone Encounter (Signed)
Rx sent 

## 2013-12-16 NOTE — Telephone Encounter (Signed)
[facility] requesting refill of levothyroxine (SYNTHROID, LEVOTHROID) 50 MCG tablet #90

## 2014-01-13 NOTE — Progress Notes (Signed)
   Subjective:    Patient ID: Robert Rowe, male    DOB: 05-20-1918, 80 y.o.   MRN: [id number]  HPI    Review of Systems     Objective:   Physical Exam        Assessment & Plan:  Pt not seen

## 2014-02-04 ENCOUNTER — Encounter: Payer: Self-pay | Admitting: Cardiology

## 2014-02-04 ENCOUNTER — Ambulatory Visit (INDEPENDENT_AMBULATORY_CARE_PROVIDER_SITE_OTHER): Payer: Medicare Other | Admitting: Cardiology

## 2014-02-04 VITALS — BP 134/72 | HR 88 | Ht 63.0 in | Wt 150.0 lb

## 2014-02-04 DIAGNOSIS — Z8679 Personal history of other diseases of the circulatory system: Secondary | ICD-10-CM

## 2014-02-04 DIAGNOSIS — I493 Ventricular premature depolarization: Secondary | ICD-10-CM

## 2014-02-04 DIAGNOSIS — K219 Gastro-esophageal reflux disease without esophagitis: Secondary | ICD-10-CM

## 2014-02-04 DIAGNOSIS — I4949 Other premature depolarization: Secondary | ICD-10-CM

## 2014-02-04 NOTE — Assessment & Plan Note (Signed)
The patient has not had any recurrent TIA symptoms.  He remains on Plavix.

## 2014-02-04 NOTE — Assessment & Plan Note (Signed)
The patient has a history of GERD.  He develops substernal pain if he drinks even a small amount of alcohol.  He takes sublingual nitroglycerin for relief of that pain.

## 2014-02-04 NOTE — Progress Notes (Signed)
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  He comes in today for cardiology followup.  He has a history of frequent PVCs.  He does not feel the PVCs in his chest but feels that when he checks his radial pulse .he has a prior history of known PVCs We had previously seen him several months ago because of some concern over tightness in the chest relieved by sublingual nitroglycerin. In reviewing his old records he has had these complaints in the past. He does not experience any exertional chest discomfort. He develops tightness in his chest if he has an alcoholic drink . The patient gives a history of having had a large hiatal hernia for many years associated with spasms. Dr. Lyla Son has done numerous upper endoscopies and dilatations of his esophagus in the past but none in the past 10 years or so. The patient continues to be physically active. He goes to the gym to exercise on a daily basis. He rides an exercise bicycle which does not cause any chest tightness or discomfort. He has had previous stress tests which have been negative for ischemia. 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 Recently the patient has noted some mild ankle edema and he plans to cut back on his dietary salt intake. He has not been having any orthopnea and he sleeps flat. He states that his weight has been stable. His last echocardiogram was 03/03/10 which showed a normal ejection fraction of 60-65% and there was minimal aortic insufficiency and minimal mitral regurgitation.  The patient was vacationing in [place] in April 2014 and awoke one night with chest discomfort which did not respond to nitroglycerin x2.  He went to the  grand [facility] where they observed him and did a nuclear stress test on 03/06/13 which showed no ischemia and his ejection fraction is 57% The patient continues to have good exercise tolerance.  He has just recently returned from wintering in [place].  Today he played 9 holes of golf.  His exercise tolerance continues to be good despite his advanced age.   Current Outpatient Prescriptions  Medication Sig Dispense Refill  . clopidogrel (PLAVIX) 75 MG tablet Take 1 tablet (75 mg total) by mouth daily.  90 tablet  3  . fluticasone (FLONASE) 50 MCG/ACT nasal spray Place 2 sprays into both nostrils daily.  16 g  2  . folic acid (FOLVITE) 1 MG tablet Take 1 mg by mouth daily.        Marland Kitchen levothyroxine (SYNTHROID, LEVOTHROID) 50 MCG tablet TAKE 1 TABLET (50 MCG TOTAL) BY MOUTH DAILY. **NEEDS APPT  30 tablet  2  . meloxicam (MOBIC) 15 MG tablet TAKE 1 TABLET (15 MG TOTAL) BY MOUTH DAILY.  90 tablet  1  . methotrexate (RHEUMATREX) 2.5 MG tablet Take 10 mg by mouth once a week. On Mondays; Caution:Chemotherapy. Protect from light. 4 tablets weekly      . metoprolol succinate (TOPROL-XL) 25 MG 24 hr tablet Take 25 mg by mouth at bedtime.       . Multiple Vitamin (MULTIVITAMIN) tablet Take 1 tablet by mouth daily.        Marland Kitchen  nitroGLYCERIN (NITROSTAT) 0.4 MG SL tablet Place 1 tablet (0.4 mg total) under the tongue every 5 (five) minutes as needed.  15 tablet  1  . pantoprazole (PROTONIX) 40 MG tablet Take 1 tablet (40 mg total) by mouth daily.  90 tablet  3   No current facility-administered medications for this visit.    Allergies  Allergen Reactions  . Sulfonamide Derivatives     Patient Active Problem List   Diagnosis Date Noted  . Frequent PVCs 08/22/2013  . Unspecified hypothyroidism 05/09/2013  . Stricture and stenosis of esophagus 03/12/2013  . Dysphagia, unspecified 02/22/2013  . Dyspnea 03/23/2011  . PALPITATIONS 02/12/2010  . CHEST DISCOMFORT 04/25/2009  . PEDAL EDEMA  05/01/2008  . GERD 12/03/2007  . HYPERLIPIDEMIA 04/05/2007  . BENIGN PROSTATIC HYPERTROPHY 04/05/2007  . RHEUMATOID ARTHRITIS 04/05/2007  . Osteoarth NOS-Unspec 04/05/2007  . TRANSIENT ISCHEMIC ATTACK, HX OF 04/05/2007    History  Smoking status  . Former Smoker  . Quit date: 08/13/1953  Smokeless tobacco  . Never Used    History  Alcohol Use No    Comment: patient has given up alcohol    Family History  Problem Relation Age of Onset  . Breast cancer Mother   . Lymphoma Brother   . Heart disease Father     Review of Systems: Constitutional: no fever chills diaphoresis or fatigue or change in weight.  Head and neck: no hearing loss, no epistaxis, no photophobia or visual disturbance. Respiratory: No cough, shortness of breath or wheezing. Cardiovascular: No chest pain peripheral edema, palpitations. Gastrointestinal: No abdominal distention, no abdominal pain, no change in bowel habits hematochezia or melena. Genitourinary: No dysuria, no frequency, no urgency, no nocturia. Musculoskeletal:No arthralgias, no back pain, no gait disturbance or myalgias. Neurological: No dizziness, no headaches, no numbness, no seizures, no syncope, no weakness, no tremors. Hematologic: No lymphadenopathy, no easy bruising. Psychiatric: No confusion, no hallucinations, no sleep disturbance.    Physical Exam: Filed Vitals:   02/04/14 1641  BP: 134/72  Pulse: 88   the general appearance reveals a well-developed elderly gentleman in no distress.  He is alert.The head and neck exam reveals pupils equal and reactive.  Extraocular movements are full.  There is no scleral icterus.  The mouth and pharynx are normal.  The neck is supple.  The carotids reveal no bruits.  The jugular venous pressure is normal.  The  thyroid is not enlarged.  There is no lymphadenopathy.  The chest is clear to percussion and auscultation.  There are no rales or rhonchi.  Expansion of the chest is symmetrical.  The  precordium is quiet.  Frequent PVCs are present. The first heart sound is normal.  The second heart sound is physiologically split.  There is no murmur gallop rub or click.  There is no abnormal lift or heave.  The abdomen is soft and nontender.  The bowel sounds are normal.  The liver and spleen are not enlarged.  There are no abdominal masses.  There are no abdominal bruits.  Extremities reveal good pedal pulses.  There is no phlebitis and there is mild pretibial edema bilaterally. There is no cyanosis or clubbing.  Strength is normal and symmetrical in all extremities.  There is no lateralizing weakness.  There are no sensory deficits.  The skin is warm and dry.  There is no rash.  EKG today shows normal sinus rhythm with frequent PVCs and bigeminy  Assessment / Plan:  The patient will continue Toprol-XL 25  mg daily.  He was not able to tolerate a higher dose because of worsening malaise and fatigue. Continue same medication.  Recheck in 4 months for office visit and EKG.

## 2014-02-04 NOTE — Patient Instructions (Signed)
Your physician recommends that you continue on your current medications as directed. Please refer to the Current Medication list given to you today.  Your physician wants you to follow-up in: 4 month ov/ekg  You will receive a reminder letter in the mail two months in advance. If you don't receive a letter, please call our office to schedule the follow-up appointment.  

## 2014-02-04 NOTE — Assessment & Plan Note (Signed)
Patient continues to have frequent asymptomatic PVCs.  He has not had any dizzy spells or syncope.  He has not had any awareness of his heart racing.

## 2014-02-11 ENCOUNTER — Encounter: Payer: Self-pay | Admitting: Gastroenterology

## 2014-03-10 ENCOUNTER — Ambulatory Visit (INDEPENDENT_AMBULATORY_CARE_PROVIDER_SITE_OTHER): Payer: Medicare Other | Admitting: Internal Medicine

## 2014-03-10 ENCOUNTER — Encounter: Payer: Self-pay | Admitting: Internal Medicine

## 2014-03-10 VITALS — BP 120/80 | HR 91 | Temp 97.7°F | Resp 20 | Ht 61.5 in | Wt 153.0 lb

## 2014-03-10 DIAGNOSIS — Z0000 Encounter for general adult medical examination without abnormal findings: Secondary | ICD-10-CM

## 2014-03-10 DIAGNOSIS — N400 Enlarged prostate without lower urinary tract symptoms: Secondary | ICD-10-CM

## 2014-03-10 DIAGNOSIS — R609 Edema, unspecified: Secondary | ICD-10-CM

## 2014-03-10 DIAGNOSIS — E039 Hypothyroidism, unspecified: Secondary | ICD-10-CM

## 2014-03-10 DIAGNOSIS — K219 Gastro-esophageal reflux disease without esophagitis: Secondary | ICD-10-CM

## 2014-03-10 DIAGNOSIS — M069 Rheumatoid arthritis, unspecified: Secondary | ICD-10-CM

## 2014-03-10 MED ORDER — LEVOTHYROXINE SODIUM 50 MCG PO TABS: ORAL_TABLET | ORAL | Status: DC

## 2014-03-10 NOTE — Progress Notes (Signed)
Subjective:    Patient ID: Robert Rowe, male    DOB: May 01, 1918, 80 y.o.   MRN: [id number]  HPI  80 year old patient who is seen today for a preventive health examination.  He is followed by rheumatology and cardiology and urology. He does remarkably well.  Complaints include urgency, and frequency and is scheduled for urology followup soon.  He has been seen by cardiology recently, due to nonspecific chest pain, and PVCs.  He does have a history of gastro esophageal reflux disease. His rheumatologic status has been stable.  He remains on methotrexate.  Past Medical History  Diagnosis Date  . BENIGN PROSTATIC HYPERTROPHY 04/05/2007  . CHEST DISCOMFORT 04/25/2009  . GERD 12/03/2007  . HYPERLIPIDEMIA 04/05/2007  . Osteoarth NOS-Unspec 04/05/2007  . Palpitations 02/12/2010  . PEDAL EDEMA 05/01/2008  . Rheumatoid arthritis(714.0) 04/05/2007  . TRANSIENT ISCHEMIC ATTACK, HX OF 04/05/2007  . DJD (degenerative joint disease)   . Chronic kidney disease     chronic   . Hiatal hernia     History   Social History  . Marital Status: Married    Spouse Name: N/A    Number of Children: N/A  . Years of Education: N/A   Occupational History  . Not on file.   Social History Main Topics  . Smoking status: Former Smoker    Quit date: 08/13/1953  . Smokeless tobacco: Never Used  . Alcohol Use: No     Comment: patient has given up alcohol  . Drug Use: No  . Sexual Activity: Not on file   Other Topics Concern  . Not on file   Social History Narrative  . No narrative on file    Past Surgical History  Procedure Laterality Date  . Hemorrhoid surgery    . Hernia repair      inguinal x5  . Cataract extraction    . Knee surgery      left  . Shoulder surgery      left  . Prostate cryoablation      2000  . Esophagogastroduodenoscopy N/A 03/12/2013    Procedure: ESOPHAGOGASTRODUODENOSCOPY (EGD);  Surgeon: Inda Castle, MD;  Location: [facility];  Service: Endoscopy;  Laterality: N/A;    . Balloon dilation N/A 03/12/2013    Procedure: BALLOON DILATION;  Surgeon: Inda Castle, MD;  Location: [facility];  Service: Endoscopy;  Laterality: N/A;    Family History  Problem Relation Age of Onset  . Breast cancer Mother   . Lymphoma Brother   . Heart disease Father     Allergies  Allergen Reactions  . Sulfonamide Derivatives     Current Outpatient Prescriptions on File Prior to Visit  Medication Sig Dispense Refill  . clopidogrel (PLAVIX) 75 MG tablet Take 1 tablet (75 mg total) by mouth daily.  90 tablet  3  . fluticasone (FLONASE) 50 MCG/ACT nasal spray Place 2 sprays into both nostrils daily.  16 g  2  . folic acid (FOLVITE) 1 MG tablet Take 1 mg by mouth daily.        . methotrexate (RHEUMATREX) 2.5 MG tablet Take 10 mg by mouth once a week. On Mondays; Caution:Chemotherapy. Protect from light. 4 tablets weekly      . metoprolol succinate (TOPROL-XL) 25 MG 24 hr tablet Take 25 mg by mouth at bedtime.       . Multiple Vitamin (MULTIVITAMIN) tablet Take 1 tablet by mouth daily.        . nitroGLYCERIN (NITROSTAT) 0.4 MG  SL tablet Place 1 tablet (0.4 mg total) under the tongue every 5 (five) minutes as needed.  15 tablet  1  . pantoprazole (PROTONIX) 40 MG tablet Take 1 tablet (40 mg total) by mouth daily.  90 tablet  3   No current facility-administered medications on file prior to visit.    BP 120/80  Pulse 91  Temp(Src) 97.7 F (36.5 C) (Oral)  Resp 20  Ht 5' 1.5" (1.562 m)  Wt 153 lb (69.4 kg)  BMI 28.44 kg/m2  SpO2 97%       Review of Systems  Constitutional: Negative for fever, chills, activity change, appetite change and fatigue.  HENT: Negative for congestion, dental problem, ear pain, hearing loss, mouth sores, rhinorrhea, sinus pressure, sneezing, tinnitus, trouble swallowing and voice change.   Eyes: Negative for photophobia, pain, redness and visual disturbance.  Respiratory: Negative for apnea, cough, choking, chest tightness, shortness of  breath and wheezing.   Cardiovascular: Negative for chest pain, palpitations and leg swelling.  Gastrointestinal: Negative for nausea, vomiting, abdominal pain, diarrhea, constipation, blood in stool, abdominal distention, anal bleeding and rectal pain.  Genitourinary: Positive for urgency and frequency. Negative for dysuria, hematuria, flank pain, decreased urine volume, discharge, penile swelling, scrotal swelling, difficulty urinating, genital sores and testicular pain.  Musculoskeletal: Negative for arthralgias, back pain, gait problem, joint swelling, myalgias, neck pain and neck stiffness.  Skin: Negative for color change, rash and wound.  Neurological: Negative for dizziness, tremors, seizures, syncope, facial asymmetry, speech difficulty, weakness, light-headedness, numbness and headaches.  Hematological: Negative for adenopathy. Does not bruise/bleed easily.  Psychiatric/Behavioral: Negative for suicidal ideas, hallucinations, behavioral problems, confusion, sleep disturbance, self-injury, dysphoric mood, decreased concentration and agitation. The patient is not nervous/anxious.        Objective:   Physical Exam  Constitutional: He appears well-developed and well-nourished.  HENT:  Head: Normocephalic and atraumatic.  Right Ear: External ear normal.  Left Ear: External ear normal.  Nose: Nose normal.  Mouth/Throat: Oropharynx is clear and moist.  Dentures in place  Eyes: Conjunctivae and EOM are normal. Pupils are equal, round, and reactive to light. No scleral icterus.  Neck: Normal range of motion. Neck supple. No JVD present. No thyromegaly present.  Cardiovascular: Normal rate and normal heart sounds.  Exam reveals no gallop and no friction rub.   No murmur heard. Occasional ectopics  Pulmonary/Chest: Effort normal and breath sounds normal. He exhibits no tenderness.  Abdominal: Soft. Bowel sounds are normal. He exhibits no distension and no mass. There is no tenderness.    Genitourinary: Penis normal.  Musculoskeletal: Normal range of motion. He exhibits edema. He exhibits no tenderness.  Plus 1 pedal edema  Lymphadenopathy:    He has no cervical adenopathy.  Neurological: He is alert. He has normal reflexes. No cranial nerve deficit. Coordination normal.  Decreased vibratory sensation distally  Skin: Skin is warm and dry. No rash noted.  Psychiatric: He has a normal mood and affect. His behavior is normal.          Assessment & Plan:   Preventive health examination History of rheumatoid arthritis.  Stable Gastroesophageal reflux disease.  Continue PPI therapy Mild pedal edema Hypothyroidism  We'll review lab from rheumatology.  May need to obtain a TSH No change in therapy

## 2014-03-10 NOTE — Patient Instructions (Signed)
Limit your sodium (Salt) intake    It is important that you exercise regularly, at least 20 minutes 3 to 4 times per week.  If you develop chest pain or shortness of breath seek  medical attention.  Return in one year for follow-up   

## 2014-03-10 NOTE — Progress Notes (Signed)
Pre-visit discussion using our clinic review tool. No additional management support is needed unless otherwise documented below in the visit note.  

## 2014-03-19 ENCOUNTER — Telehealth: Payer: Self-pay | Admitting: Internal Medicine

## 2014-03-19 MED ORDER — FLUTICASONE PROPIONATE 50 MCG/ACT NA SUSP: 2.0000 | Freq: Every day | NASAL | Status: DC

## 2014-03-19 NOTE — Telephone Encounter (Signed)
Rx sent 

## 2014-03-19 NOTE — Telephone Encounter (Signed)
[facility] [place] - [place], [address] is requesting re-fill on fluticasone (FLONASE) 50 MCG/ACT nasal spray

## 2014-04-02 ENCOUNTER — Telehealth: Payer: Self-pay | Admitting: Gastroenterology

## 2014-04-02 MED ORDER — DEXLANSOPRAZOLE 60 MG PO CPDR: DELAYED_RELEASE_CAPSULE | ORAL | Status: DC

## 2014-04-02 NOTE — Telephone Encounter (Signed)
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 Deatra Ina regarding the discomfort he is having in his chest.

## 2014-04-02 NOTE — Telephone Encounter (Signed)
I spoke directly with the patient.  He is having chest tightness when he drinks cold liquids and alcohol.  Symptoms are relieved with antacids.  Symptoms are similar to what he has had in the past.  He denies dysphagia.  He takes daily Protonix.  I believe symptoms are due to esophageal irritation.  Plan to change from Protonix to dexilant 60 mg before breakfast and dinner for 2 weeks, and then once daily before breakfast.  Patient was instructed to call back if symptoms don't improve.

## 2014-04-09 LAB — TSH: TSH: 2.47 u[IU]/mL (ref <=5.90)

## 2014-04-11 ENCOUNTER — Encounter: Payer: Self-pay | Admitting: Internal Medicine

## 2014-04-23 ENCOUNTER — Telehealth: Payer: Self-pay | Admitting: Internal Medicine

## 2014-04-23 NOTE — Telephone Encounter (Signed)
[place], [address] is requesting 90 day re-fill on pantoprazole (PROTONIX) 40 MG tablet

## 2014-04-24 NOTE — Telephone Encounter (Signed)
Pt is on Dexilant not Protonix anymore.

## 2014-06-18 ENCOUNTER — Encounter: Payer: Self-pay | Admitting: Sports Medicine

## 2014-06-18 ENCOUNTER — Ambulatory Visit (INDEPENDENT_AMBULATORY_CARE_PROVIDER_SITE_OTHER): Payer: Medicare Other | Admitting: Sports Medicine

## 2014-06-18 ENCOUNTER — Ambulatory Visit
Admission: RE | Admit: 2014-06-18 | Discharge: 2014-06-18 | Disposition: A | Discharge status: Home / Self Care | Payer: Medicare Other | Source: Ambulatory Visit | Attending: Sports Medicine | Admitting: Sports Medicine

## 2014-06-18 VITALS — BP 168/105 | HR 86 | Ht 61.0 in | Wt 150.0 lb

## 2014-06-18 DIAGNOSIS — M25539 Pain in unspecified wrist: Secondary | ICD-10-CM

## 2014-06-18 DIAGNOSIS — M25532 Pain in left wrist: Secondary | ICD-10-CM

## 2014-06-18 NOTE — Progress Notes (Signed)
   Subjective:    Patient ID: Robert Rowe, male    DOB: 02/10/1918, 80 y.o.   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 Pain is chronic. Diffuse throughout the wrist. He did suffer a fall about 3 weeks ago but does not remember specifically injuring his wrist. He has not noticed any swelling. Up until recently he was taking meloxicam but he was instructed by his rheumatologist (Dr Amil Amen) to discontinue this. He feels like his wrist pain may be a result of stopping that medication. He has been experiencing some peripheral edema and also has a history of esophageal stricture and stenosis which may be the reason for the discontinuation of his meloxicam. He has tried some topical Voltaren as well as an elastic wrist brace but these have not been helpful. He is taking Tylenol 3 times daily but has not noticed any relief with this either.  Past medical history and current medications are reviewed. History pertinent for esophageal reflux disease, the aforementioned esophageal stricture, and rheumatoid arthritis. 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 No acute distress. Awake alert and oriented x3. Vital signs reviewed  Left wrist: Limited dorsi flexion actively by about 10-15. Full flexion. Full radial and ulnar deviation. There is bony hypertrophy diffusely around the wrist consistent with probable osteoarthritis. No appreciable ulnar drift. No significant hypertrophy of the MCP joints. No soft tissue swelling. No effusion. No bony or soft tissue tenderness to direct palpation. No tenderness in the anatomic snuff box. Negative Tinel's at the carpal tunnel. No atrophy. Good grip. Good radial and ulnar pulses.       Assessment & Plan:  Left wrist pain likely secondary to DJD  Given  his recent fall I think we should go ahead and get a three-view wrist x-ray. I will call him with those results once available. I will request the records from Dr Valora Piccolo office. I understand the concern about chronic NSAID usage but it is obvious that meloxicam helps him and I think he may be fine taking it as needed. I've asked [name] to call Dr.Beekman and discuss this directly with him. I will call him with his x-ray results once available.

## 2014-06-19 ENCOUNTER — Telehealth: Payer: Self-pay | Admitting: Sports Medicine

## 2014-06-19 NOTE — Telephone Encounter (Signed)
Message copied by Thurman Coyer on Thu Jun 19, 2014  9:50 AM ------      Message from: CERESI, Threasa Beards L      Created: Thu Jun 19, 2014  8:56 AM      Regarding: phone message      Contact: [phone]       Pt wants to know if you have seen his xray results yet? ------

## 2014-06-19 NOTE — Telephone Encounter (Signed)
I spoke with [name] on the phone today after reviewing the x-rays of his left wrist. No obvious fracture. There is some calcification of the TFCC arthritic changes at the [facility] joint. Diffuse osteopenia as well. I reassured him that there is no obvious fracture. I would still like for him to call Dr.Beekman and discuss the possibility of prn meloxicam. Followup with me as needed.

## 2014-06-23 ENCOUNTER — Encounter: Payer: Self-pay | Admitting: Sports Medicine

## 2014-08-12 ENCOUNTER — Telehealth: Payer: Self-pay | Admitting: Internal Medicine

## 2014-08-12 NOTE — Telephone Encounter (Signed)
Pt is not on Pantoprazole was discontinued. Pt is taking Dexilant.

## 2014-08-12 NOTE — Telephone Encounter (Signed)
[place], [place] [address] is requesting re-fill on pantoprazole (PROTONIX) 40 MG tablet

## 2014-08-14 MED ORDER — PANTOPRAZOLE SODIUM 40 MG PO TBEC: 40.0000 mg | DELAYED_RELEASE_TABLET | Freq: Every day | ORAL | Status: DC

## 2014-08-14 NOTE — Addendum Note (Signed)
Addended by: Marian Sorrow on: 08/14/2014 03:25 PM   Modules accepted: Orders

## 2014-08-14 NOTE — Telephone Encounter (Signed)
Pt notified Rx sent to pharmacy

## 2014-08-14 NOTE — Telephone Encounter (Signed)
Pt called back and states that he didn't want Dexilant due to the cost and he wants pantoprazole called in to the pharmacy.

## 2014-08-21 ENCOUNTER — Telehealth: Payer: Self-pay | Admitting: Gastroenterology

## 2014-08-21 NOTE — Telephone Encounter (Signed)
Patient requests a call directly from Dr Deatra Ina

## 2014-08-25 NOTE — Telephone Encounter (Signed)
When the patient eats a very heavy meal, spicy foods or ice cream, he may develop some chest discomfort.  This is relieved with nitroglycerin.  Symptoms only occur with dietary indiscretion.  He denies dysphagia.  I advised Dr. Pascal Lux to take a second dose of protonix before dinner for about 2 weeks and then as needed if he anticipates eating any of the above.  He will call back if symptoms are not improved.

## 2014-09-12 ENCOUNTER — Other Ambulatory Visit: Payer: Self-pay | Admitting: *Deleted

## 2014-09-12 ENCOUNTER — Other Ambulatory Visit: Payer: Self-pay

## 2014-09-12 MED ORDER — METOPROLOL SUCCINATE ER 25 MG PO TB24
25.0000 mg | ORAL_TABLET | Freq: Every day | ORAL | Status: DC
Start: 2014-09-12 — End: 2015-01-12

## 2014-09-12 MED ORDER — METOPROLOL SUCCINATE ER 25 MG PO TB24: 25.0000 mg | ORAL_TABLET | Freq: Every day | ORAL | Status: DC

## 2014-09-25 ENCOUNTER — Telehealth: Payer: Self-pay | Admitting: Internal Medicine

## 2014-09-25 MED ORDER — LEVOTHYROXINE SODIUM 50 MCG PO TABS: ORAL_TABLET | ORAL | Status: DC

## 2014-09-25 NOTE — Telephone Encounter (Signed)
[place], [place] [address] is requesting 90 day re-fill on levothyroxine (SYNTHROID, LEVOTHROID) 50 MCG tablet

## 2014-10-21 ENCOUNTER — Telehealth: Payer: Self-pay | Admitting: Internal Medicine

## 2014-10-21 MED ORDER — CLOPIDOGREL BISULFATE 75 MG PO TABS: 75.0000 mg | ORAL_TABLET | Freq: Every day | ORAL | Status: DC

## 2014-10-21 NOTE — Telephone Encounter (Signed)
Pharm called to request refill clopidogrel (PLAVIX) 75 MG tablet  90 day [facility]

## 2014-10-21 NOTE — Telephone Encounter (Signed)
Rx sent 

## 2014-11-10 ENCOUNTER — Ambulatory Visit (INDEPENDENT_AMBULATORY_CARE_PROVIDER_SITE_OTHER): Payer: Medicare Other | Admitting: Cardiology

## 2014-11-10 ENCOUNTER — Encounter: Payer: Self-pay | Admitting: Cardiology

## 2014-11-10 VITALS — BP 136/88 | HR 68 | Ht 61.0 in | Wt 149.0 lb

## 2014-11-10 DIAGNOSIS — K219 Gastro-esophageal reflux disease without esophagitis: Secondary | ICD-10-CM

## 2014-11-10 DIAGNOSIS — I493 Ventricular premature depolarization: Secondary | ICD-10-CM

## 2014-11-10 NOTE — Assessment & Plan Note (Signed)
No acute symptoms of GERD recently.  He tries not to over eat.  He is avoiding alcohol

## 2014-11-10 NOTE — Assessment & Plan Note (Signed)
The patient is clinically euthyroid. 

## 2014-11-10 NOTE — Patient Instructions (Signed)
Your physician recommends that you continue on your current medications as directed. Please refer to the Current Medication list given to you today.  Your physician wants you to follow-up in: 6 month ov/ekg You will receive a reminder letter in the mail two months in advance. If you don't receive a letter, please call our office to schedule the follow-up appointment.  

## 2014-11-10 NOTE — Assessment & Plan Note (Signed)
PVCs are absent today.  Continue to avoid alcohol and caffeine

## 2014-11-10 NOTE — Progress Notes (Signed)
Robert Rowe Date of Birth:  1918/10/25 [address] 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  He comes in today for cardiology followup.  He has a history of frequent PVCs.  He does not feel the PVCs in his chest but feels that when he checks his radial pulse .he has a prior history of known PVCs We had previously seen him several months ago because of some concern over tightness in the chest relieved by sublingual nitroglycerin. In reviewing his old records he has had these complaints in the past. He does not experience any exertional chest discomfort. He develops tightness in his chest if he has an alcoholic drink . The patient gives a history of having had a large hiatal hernia for many years associated with spasms. Dr. Lyla Son has done numerous upper endoscopies and dilatations of his esophagus in the past but none in the past 10 years or so. The patient continues to be physically active. He goes to the gym to exercise on a daily basis. He rides an exercise bicycle which does not cause any chest tightness or discomfort. He has had previous stress tests which have been negative for ischemia. 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 Recently the patient has noted some mild ankle edema and he plans to cut back on his dietary salt intake. He has not been having any orthopnea and he sleeps flat. He states that his weight has been stable. His last echocardiogram was 03/03/10 which showed a normal ejection fraction of 60-65% and there was minimal aortic insufficiency and minimal mitral regurgitation.  The patient was vacationing in [place] in April 2014 and awoke one night with chest discomfort which did not respond to nitroglycerin x2.  [facility] to the  [facility] where they observed him and did a nuclear stress test on 03/06/13 which showed no ischemia and his ejection fraction is 57% The patient continues to have good exercise tolerance.  He still plays golf when the weather allows.  He will be spending several weeks in [place] at the end of February.   Current Outpatient Prescriptions  Medication Sig Dispense Refill  . clopidogrel (PLAVIX) 75 MG tablet Take 1 tablet (75 mg total) by mouth daily. 90 tablet 3  . fluticasone (FLONASE) 50 MCG/ACT nasal spray Place 2 sprays into both nostrils daily. 16 g 5  . folic acid (FOLVITE) 1 MG tablet Take 1 mg by mouth daily.      Marland Kitchen levothyroxine (SYNTHROID, LEVOTHROID) 50 MCG tablet TAKE 1 TABLET (50 MCG TOTAL) BY MOUTH DAILY. 90 tablet 1  . meloxicam (MOBIC) 15 MG tablet TAKE 1/2 to 1 TABLET (15 MG TOTAL) BY MOUTH DAILY.    . methotrexate (RHEUMATREX) 2.5 MG tablet Take 10 mg by mouth once a week. On Mondays; Caution:Chemotherapy. Protect from light. 4 tablets weekly    . metoprolol succinate (TOPROL-XL) 25 MG 24 hr tablet Take 1 tablet (25 mg total) by mouth at bedtime. 90 tablet 0  . Multiple Vitamin (MULTIVITAMIN) tablet Take 1 tablet by mouth daily.      . nitroGLYCERIN (NITROSTAT) 0.4 MG SL tablet Place 1 tablet (0.4 mg total) under the tongue every 5 (five)  minutes as needed. 15 tablet 1  . pantoprazole (PROTONIX) 40 MG tablet Take 1 tablet (40 mg total) by mouth daily. 90 tablet 3   No current facility-administered medications for this visit.    Allergies  Allergen Reactions  . Sulfonamide Derivatives     Patient does not remember allergy symptoms    Patient Active Problem List   Diagnosis Date Noted  . Frequent PVCs 08/22/2013  . Unspecified hypothyroidism 05/09/2013  . Stricture and stenosis of esophagus 03/12/2013  . Dysphagia, unspecified 02/22/2013  . Dyspnea 03/23/2011  . PALPITATIONS 02/12/2010  . CHEST DISCOMFORT 04/25/2009  . PEDAL EDEMA 05/01/2008    . GERD 12/03/2007  . HYPERLIPIDEMIA 04/05/2007  . BENIGN PROSTATIC HYPERTROPHY 04/05/2007  . RHEUMATOID ARTHRITIS 04/05/2007  . Osteoarth NOS-Unspec 04/05/2007  . TRANSIENT ISCHEMIC ATTACK, HX OF 04/05/2007    History  Smoking status  . Former Smoker  . Quit date: 08/13/1953  Smokeless tobacco  . Never Used    History  Alcohol Use No    Comment: patient has given up alcohol    Family History  Problem Relation Age of Onset  . Breast cancer Mother   . Lymphoma Brother   . Heart disease Father     Review of Systems: Constitutional: no fever chills diaphoresis or fatigue or change in weight.  Head and neck: no hearing loss, no epistaxis, no photophobia or visual disturbance. Respiratory: No cough, shortness of breath or wheezing. Cardiovascular: No chest pain peripheral edema, palpitations. Gastrointestinal: No abdominal distention, no abdominal pain, no change in bowel habits hematochezia or melena. Genitourinary: No dysuria, no frequency, no urgency, no nocturia. Musculoskeletal:No arthralgias, no back pain, no gait disturbance or myalgias. Neurological: No dizziness, no headaches, no numbness, no seizures, no syncope, no weakness, no tremors. Hematologic: No lymphadenopathy, no easy bruising. Psychiatric: No confusion, no hallucinations, no sleep disturbance.    Physical Exam: Filed Vitals:   11/10/14 1036  BP: 136/88  Pulse: 68   the general appearance reveals a well-developed elderly gentleman in no distress.  He is alert.The head and neck exam reveals pupils equal and reactive.  Extraocular movements are full.  There is no scleral icterus.  The mouth and pharynx are normal.  The neck is supple.  The carotids reveal no bruits.  The jugular venous pressure is normal.  The  thyroid is not enlarged.  There is no lymphadenopathy.  The chest is clear to percussion and auscultation.  There are no rales or rhonchi.  Expansion of the chest is symmetrical.  The precordium is  quiet.  Frequent PVCs are present. The first heart sound is normal.  The second heart sound is physiologically split.  There is no murmur gallop rub or click.  There is no abnormal lift or heave.  The abdomen is soft and nontender.  The bowel sounds are normal.  The liver and spleen are not enlarged.  There are no abdominal masses.  There are no abdominal bruits.  Extremities reveal good pedal pulses.  There is no phlebitis and there is mild pretibial edema bilaterally. There is no cyanosis or clubbing.  Strength is normal and symmetrical in all extremities.  There is no lateralizing weakness.  There are no sensory deficits.  The skin is warm and dry.  There is no rash.  EKG today shows normal sinus rhythm .  PVCs are absent today.  The tracing is normal.  Assessment / Plan: 1.  Intermittent PVCs 2.  Occasional chest pain triggered by ingesting alcohol  or overeating.  Relieved by sublingual nitroglycerin. 3.  History of GERD and past history of esophageal stricture 4.  Past history of TIA, on Plavix long-term, without recurrent symptoms 5.  Hyperlipidemia followed by his PCP  The patient will continue Toprol-XL 25 mg daily.  He was not able to tolerate a higher dose because of worsening malaise and fatigue. Continue same medication.  Recheck in 6 months for office visit and EKG.

## 2014-12-09 DIAGNOSIS — M150 Primary generalized (osteo)arthritis: Secondary | ICD-10-CM | POA: Diagnosis not present

## 2014-12-09 DIAGNOSIS — M0609 Rheumatoid arthritis without rheumatoid factor, multiple sites: Secondary | ICD-10-CM | POA: Diagnosis not present

## 2014-12-17 ENCOUNTER — Other Ambulatory Visit: Payer: Self-pay

## 2014-12-17 MED ORDER — LEVOTHYROXINE SODIUM 50 MCG PO TABS: ORAL_TABLET | ORAL | Status: DC

## 2014-12-17 NOTE — Telephone Encounter (Signed)
Rx request for Leovthyroxine 50 mcg tablet- Take 1 tablet by mouth daily #90  Pharm:  [facility]  Rx sent to pharmacy.

## 2014-12-23 DIAGNOSIS — N401 Enlarged prostate with lower urinary tract symptoms: Secondary | ICD-10-CM | POA: Diagnosis not present

## 2014-12-23 DIAGNOSIS — N3281 Overactive bladder: Secondary | ICD-10-CM | POA: Diagnosis not present

## 2014-12-23 DIAGNOSIS — N3942 Incontinence without sensory awareness: Secondary | ICD-10-CM | POA: Diagnosis not present

## 2014-12-23 DIAGNOSIS — R972 Elevated prostate specific antigen [PSA]: Secondary | ICD-10-CM | POA: Diagnosis not present

## 2014-12-31 DIAGNOSIS — L821 Other seborrheic keratosis: Secondary | ICD-10-CM | POA: Diagnosis not present

## 2014-12-31 DIAGNOSIS — D485 Neoplasm of uncertain behavior of skin: Secondary | ICD-10-CM | POA: Diagnosis not present

## 2014-12-31 DIAGNOSIS — L570 Actinic keratosis: Secondary | ICD-10-CM | POA: Diagnosis not present

## 2015-01-12 ENCOUNTER — Other Ambulatory Visit: Payer: Self-pay

## 2015-01-12 MED ORDER — METOPROLOL SUCCINATE ER 25 MG PO TB24: 25.0000 mg | ORAL_TABLET | Freq: Every day | ORAL | Status: DC

## 2015-02-02 ENCOUNTER — Telehealth: Payer: Self-pay | Admitting: Gastroenterology

## 2015-02-03 DIAGNOSIS — N3281 Overactive bladder: Secondary | ICD-10-CM | POA: Diagnosis not present

## 2015-02-03 DIAGNOSIS — N138 Other obstructive and reflux uropathy: Secondary | ICD-10-CM | POA: Diagnosis not present

## 2015-02-03 DIAGNOSIS — N401 Enlarged prostate with lower urinary tract symptoms: Secondary | ICD-10-CM | POA: Diagnosis not present

## 2015-02-03 DIAGNOSIS — R972 Elevated prostate specific antigen [PSA]: Secondary | ICD-10-CM | POA: Diagnosis not present

## 2015-02-03 DIAGNOSIS — N3942 Incontinence without sensory awareness: Secondary | ICD-10-CM | POA: Diagnosis not present

## 2015-02-03 NOTE — Telephone Encounter (Signed)
If he overeats he may gag and have some vomiting.  He raise the question whether he has an esophageal diverticulum.  I reminded him that in April, 2014 esophagram did not demonstrate a diverticulum.  He does, however, have a large hiatal hernia.  I suspect symptoms are related to that.  Given his advanced age and comorbidities I explained that I would not intervene in any manner.  His symptoms are minimal.

## 2015-02-09 ENCOUNTER — Other Ambulatory Visit: Payer: Self-pay | Admitting: Urology

## 2015-02-23 ENCOUNTER — Telehealth: Payer: Self-pay | Admitting: Internal Medicine

## 2015-02-23 DIAGNOSIS — R55 Syncope and collapse: Secondary | ICD-10-CM

## 2015-02-23 NOTE — Telephone Encounter (Signed)
Pt called and states he needs a physical soon .  Advised pt that Dr. Raliegh Ip would be out of the office until May.  Pt would like a call back.

## 2015-02-23 NOTE — Telephone Encounter (Signed)
Please schedule neurology appointment for evaluation of syncope and possible seizure disorder

## 2015-02-23 NOTE — Telephone Encounter (Signed)
Pt called back told him referral to Neurology was done and someone will contact him regarding an appt. Also physical is not due till after April 20th and Dr.K will not be here so schedule sometime in May. Pt verbalized understanding.

## 2015-02-23 NOTE — Telephone Encounter (Signed)
Left message on voicemail to call office.  

## 2015-02-23 NOTE — Telephone Encounter (Signed)
Patient's son is requesting a callback from Dr. Raliegh Ip.

## 2015-02-26 ENCOUNTER — Encounter: Payer: Self-pay | Admitting: Neurology

## 2015-02-26 ENCOUNTER — Ambulatory Visit (INDEPENDENT_AMBULATORY_CARE_PROVIDER_SITE_OTHER): Payer: Medicare Other | Admitting: Neurology

## 2015-02-26 VITALS — BP 122/78 | HR 89 | Resp 16 | Ht 62.5 in | Wt 152.0 lb

## 2015-02-26 DIAGNOSIS — R404 Transient alteration of awareness: Secondary | ICD-10-CM | POA: Diagnosis not present

## 2015-02-26 LAB — BUN: BUN: 25 mg/dL — ABNORMAL HIGH (ref 6–23)

## 2015-02-26 LAB — CREATININE, SERUM: Creat: 1.59 mg/dL — ABNORMAL HIGH (ref 0.50–1.35)

## 2015-02-26 NOTE — Progress Notes (Signed)
NEUROLOGY CONSULTATION NOTE  NORVAL LARICK MRN: [id number] DOB: 08-16-1918  Referring provider: Dr. Bluford Kaufmann Primary care provider: Dr. Bluford Kaufmann  Reason for consult:  Episodes of loss of consciousness  Dear Dr Burnice Logan:  Thank you for your kind referral of INIYAN NIXDORF for consultation of the above symptoms. Although his history is well known to you, please allow me to reiterate it for the purpose of our medical record. The patient was accompanied to the clinic by his wife who also provides collateral information. Records and images were personally reviewed where available.  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 He reports that he started having recurrent symptoms in 2001, he was driving to his dentist when a peculiar feeling came over him, "running round in my head," for a couple of minutes. He denied any focal symptoms, he was able to drive without any confusion. He was evaluated at that time with an MRI brain reported as normal. He had another episode in 2004 with a normal EEG. He reports a history of hypoglycemia where he gets shaky, and that his blood glucose levels and BP were unremarkable during these episodes. He did well for several years until 2013 when he had 4 spells of confusion where a sensation of "ideas repeating over and over in his brain" occurred. There is note that he may have had weakness in the right arm with one of these spells. No associated headache. He was evaluated by neurologist [name], routine EEG normal. MRI brain did not show any acute changes, I personally reviewed scan which showed diffuse atrophy with likely ex vacuo ventriculomegaly, MRA head showed mild intracranial atherosclerosis in the left PCA, distal flow preserved. He again had no further episodes for another 3 years until 2  weeks ago while his wife was driving, she asked him to roll his window down. He reports he was taking a nap, as as he was waking up, he was trying to reach for the window with his right hand but could not find it (eyes were closed per patient because he was just waking up), then his wife noticed he seemed to be waving his right hand in the air and was trying to talk but could not get words out. He recalls this episode and denied any confusion. It only lasted a few seconds. The second episode occurred a week or so after, he woke up from his nap and was again unable to talk for a few seconds. The last episode occurred 4 days ago while in a restaurant. They report that he had not eaten that evening, he spoke at a meeting, then went to the restaurant after. He recalls feeling sleepy and went to sleep, but his wife reports that he suddenly slumped down then to the wall on his right side, eyes closed for a few seconds. She called his name and he started to sit up and drank from his cup. His wife reports he was perfectly normal soon after and did not appear confused or drowsy. He denied any associated focal numbness/tingling, or post-event weakness, no headaches, dizziness. He denies any other episodes of speech difficulties. He feels these may be hypoglycemic episodes, and has been advised to use a glucometer in the past but has not done this.   His wife denies any staring/unresponsive episodes, he denies any olfactory/gustatory hallucinations, deja vu, rising  epigastric sensation, focal numbness/tingling/weakness, myoclonic jerks. He has noticed that his memory has been failing for the past 6 months, he has difficulty recalling names mostly. He had a normal a normal birth and early development.  There is no history of febrile convulsions, CNS infections such as meningitis/encephalitis, significant traumatic brain injury, neurosurgical procedures, or family history of seizures.  PAST MEDICAL HISTORY: Past Medical  History  Diagnosis Date  . BENIGN PROSTATIC HYPERTROPHY 04/05/2007  . CHEST DISCOMFORT 04/25/2009  . GERD 12/03/2007  . HYPERLIPIDEMIA 04/05/2007  . Osteoarth NOS-Unspec 04/05/2007  . Palpitations 02/12/2010  . PEDAL EDEMA 05/01/2008  . Rheumatoid arthritis(714.0) 04/05/2007  . TRANSIENT ISCHEMIC ATTACK, HX OF 04/05/2007  . DJD (degenerative joint disease)   . Chronic kidney disease     chronic   . Hiatal hernia     PAST SURGICAL HISTORY: Past Surgical History  Procedure Laterality Date  . Hemorrhoid surgery    . Hernia repair      inguinal x5  . Cataract extraction      bilateral  . Knee surgery      left  . Shoulder surgery      left  . Prostate cryoablation      2000  . Esophagogastroduodenoscopy N/A 03/12/2013    Procedure: ESOPHAGOGASTRODUODENOSCOPY (EGD);  Surgeon: Inda Castle, MD;  Location: [facility];  Service: Endoscopy;  Laterality: N/A;  . Balloon dilation N/A 03/12/2013    Procedure: BALLOON DILATION;  Surgeon: Inda Castle, MD;  Location: [facility];  Service: Endoscopy;  Laterality: N/A;    MEDICATIONS: Current Outpatient Prescriptions on File Prior to Visit  Medication Sig Dispense Refill  . clopidogrel (PLAVIX) 75 MG tablet Take 1 tablet (75 mg total) by mouth daily. 90 tablet 3  . folic acid (FOLVITE) 1 MG tablet Take 1 mg by mouth daily.      Marland Kitchen levothyroxine (SYNTHROID, LEVOTHROID) 50 MCG tablet TAKE 1 TABLET (50 MCG TOTAL) BY MOUTH DAILY. 90 tablet 1  . methotrexate (RHEUMATREX) 2.5 MG tablet Take by mouth once a week. Take 7.5 mg once a week    . metoprolol succinate (TOPROL-XL) 25 MG 24 hr tablet Take 1 tablet (25 mg total) by mouth at bedtime. 90 tablet 0  . pantoprazole (PROTONIX) 40 MG tablet Take 1 tablet (40 mg total) by mouth daily. 90 tablet 3  . fluticasone (FLONASE) 50 MCG/ACT nasal spray Place 2 sprays into both nostrils daily. (Patient not taking: Reported on 02/26/2015) 16 g 5  . Multiple Vitamin (MULTIVITAMIN) tablet Take 1 tablet by  mouth daily.      . nitroGLYCERIN (NITROSTAT) 0.4 MG SL tablet Place 1 tablet (0.4 mg total) under the tongue every 5 (five) minutes as needed. (Patient not taking: Reported on 02/26/2015) 15 tablet 1   No current facility-administered medications on file prior to visit.    ALLERGIES: Allergies  Allergen Reactions  . Sulfonamide Derivatives     Patient does not remember allergy symptoms    FAMILY HISTORY: Family History  Problem Relation Age of Onset  . Breast cancer Mother   . Lymphoma Brother   . Heart disease Father     SOCIAL HISTORY: History   Social History  . Marital Status: Married    Spouse Name: N/A  . Number of Children: N/A  . Years of Education: N/A   Occupational History  . Not on file.   Social History Main Topics  . Smoking status: Former Smoker    Quit date: 08/13/1953  .  Smokeless tobacco: Never Used  . Alcohol Use: No     Comment: patient has given up alcohol  . Drug Use: No  . Sexual Activity: Not on file   Other Topics Concern  . Not on file   Social History Narrative    REVIEW OF SYSTEMS: Constitutional: No fevers, chills, or sweats, no generalized fatigue, change in appetite Eyes: No visual changes, double vision, eye pain Ear, nose and throat: No hearing loss, ear pain, nasal congestion, sore throat Cardiovascular: No chest pain, palpitations Respiratory:  No shortness of breath at rest or with exertion, wheezes GastrointestinaI: No nausea, vomiting, diarrhea, abdominal pain, fecal incontinence Genitourinary:  No dysuria, urinary retention or frequency Musculoskeletal:  No neck pain, back pain Integumentary: No rash, pruritus, skin lesions Neurological: as above Psychiatric: No depression, insomnia, anxiety Endocrine: No palpitations, fatigue, diaphoresis, mood swings, change in appetite, change in weight, increased thirst Hematologic/Lymphatic:  No anemia, purpura, petechiae. Allergic/Immunologic: no itchy/runny eyes, nasal  congestion, recent allergic reactions, rashes  PHYSICAL EXAM: Filed Vitals:   02/26/15 0944  BP: 122/78  Pulse: 89  Resp: 16   General: No acute distress Head:  Normocephalic/atraumatic Eyes: Fundoscopic exam shows bilateral sharp discs, no vessel changes, exudates, or hemorrhages Neck: supple, no paraspinal tenderness, full range of motion Back: No paraspinal tenderness Heart: regular rate and rhythm Lungs: Clear to auscultation bilaterally. Vascular: No carotid bruits. Skin/Extremities: No rash, no edema Neurological Exam: Mental status: alert and oriented to person, place, and time, no dysarthria or aphasia, Fund of knowledge is appropriate.  Remote memory intact. 1/3 delayed recall.  Attention and concentration are normal.    Able to name objects and repeat phrases. Cranial nerves: CN I: not tested CN II: pupils equal, round and reactive to light, visual fields intact, fundi unremarkable. CN III, IV, VI:  full range of motion, no nystagmus, no ptosis CN V: facial sensation intact CN VII: upper and lower face symmetric CN VIII: hearing intact to finger rub CN IX, X: gag intact, uvula midline CN XI: sternocleidomastoid and trapezius muscles intact CN XII: tongue midline Bulk & Tone: normal, no fasciculations. Motor: 5/5 throughout with no pronator drift. Sensation: decreased vibration to ankles bilaterally, otherwise intact to light touch, cold, pin, and joint position sense.  No extinction to double simultaneous stimulation.  Romberg test negative Deep Tendon Reflexes: +1 throughout, no ankle clonus Plantar responses: downgoing bilaterally Cerebellar: no incoordination on finger to nose testing. 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 With the last episode, he apparently very briefly slumped down to his right side. The recurrent nature of these spells is certainly concerning for seizures. Other consideration is syncope/presyncope, unlikely metabolic (he wonders about hypoglycemia). With new symptoms, MRI brain with and without contrast and routine EEG will be ordered. If routine EEG normal, a 24-hour EEG will be done to further classify his symptoms. He may benefit from cardiac monitoring as well. I discussed using a glucometer to check his blood sugars with these spells.  driving laws were discussed with the patient, and he knows to stop driving after an episode of loss of awareness/consciousness, until  6 months event-free. He will follow-up after the tests.   Thank you for allowing me to participate in the care of this patient. Please do not hesitate to call for any questions or concerns.   Ellouise Newer, M.D.  CC: Dr. Burnice Logan

## 2015-02-26 NOTE — Patient Instructions (Signed)
1. Schedule MRI brain with and without contrast 2. Schedule routine EEG, followed by 24-hour EEG 3. As per [place] driving laws, after an episode of loss of awareness or consciousness, one should not drive until 6 months event-free

## 2015-02-27 ENCOUNTER — Telehealth: Payer: Self-pay | Admitting: Family Medicine

## 2015-02-27 NOTE — Telephone Encounter (Signed)
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 Thanks

## 2015-02-27 NOTE — Telephone Encounter (Signed)
Lmovm to return my call. 

## 2015-03-02 ENCOUNTER — Ambulatory Visit (INDEPENDENT_AMBULATORY_CARE_PROVIDER_SITE_OTHER): Payer: Medicare Other | Admitting: Neurology

## 2015-03-02 DIAGNOSIS — R404 Transient alteration of awareness: Secondary | ICD-10-CM | POA: Diagnosis not present

## 2015-03-02 NOTE — Telephone Encounter (Signed)
I spoke with patient about his results & advisment today while he was in the office for his EEG. He states that his kidney function has been running slightly high & his pcp is aware. I did give him a copy of results for his records & I will forward results to his pcp/Dr. Burnice Logan.

## 2015-03-02 NOTE — Procedures (Signed)
ELECTROENCEPHALOGRAM REPORT  Date of Study: 03/02/2015  Patient's Name: Robert Rowe MRN: [id number] Date of Birth: Dec 05, 1917  Referring Provider: Dr. 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 With the last episode, he apparently very briefly slumped down to his right side  Medications: Toprol, methotrexate, Plavix, Synthroid  Technical Summary: A multichannel digital EEG recording measured by the international 10-20 system with electrodes applied with paste and impedances below 5000 ohms performed in our laboratory with EKG monitoring in an awake and asleep patient.  Hyperventilation was not performed. Photic stimulation was performed.  The digital EEG was referentially recorded, reformatted, and digitally filtered in a variety of bipolar and referential montages for optimal display.    Description: The patient is awake and asleep during the recording.  During maximal wakefulness, there is a symmetric, medium voltage 8 Hz posterior dominant rhythm that attenuates with eye opening.  The record is symmetric.  During drowsiness and stage I sleep, there is an increase in theta slowing of the background with occasional vertex waves seen.  Hyperventilation was not performed. Photic stimulation did not elicit any abnormalities.  There were no epileptiform discharges or electrographic seizures seen.    EKG lead was unremarkable.  Impression: This awake and asleep EEG is normal.    Clinical Correlation: A normal EEG does not exclude a clinical diagnosis of epilepsy.  If further clinical questions remain, prolonged EEG may be helpful.  Clinical correlation is advised.   Ellouise Newer, M.D.

## 2015-03-03 ENCOUNTER — Telehealth: Payer: Self-pay | Admitting: Family Medicine

## 2015-03-03 NOTE — Telephone Encounter (Signed)
Patient notified of results. He will proceed with 24 hour eeg.

## 2015-03-03 NOTE — Telephone Encounter (Signed)
-----   Message from Cameron Sprang, MD sent at 03/02/2015  3:33 PM EDT ----- Pls let him know the routine EEG is normal, would proceed with prolonged EEG as discussed. Thanks

## 2015-03-05 ENCOUNTER — Ambulatory Visit (HOSPITAL_COMMUNITY): Admission: RE | Admit: 2015-03-05 | Payer: Medicare Other | Source: Ambulatory Visit

## 2015-03-06 ENCOUNTER — Telehealth: Payer: Self-pay | Admitting: Neurology

## 2015-03-06 ENCOUNTER — Ambulatory Visit (HOSPITAL_COMMUNITY)
Admission: RE | Admit: 2015-03-06 | Discharge: 2015-03-06 | Disposition: A | Discharge status: Home / Self Care | Payer: Medicare Other | Source: Ambulatory Visit | Attending: Neurology | Admitting: Neurology

## 2015-03-06 DIAGNOSIS — R404 Transient alteration of awareness: Secondary | ICD-10-CM | POA: Insufficient documentation

## 2015-03-06 DIAGNOSIS — I639 Cerebral infarction, unspecified: Secondary | ICD-10-CM | POA: Diagnosis not present

## 2015-03-06 MED ORDER — GADOBENATE DIMEGLUMINE 529 MG/ML IV SOLN
15.0000 mL | Freq: Once | INTRAVENOUS | Status: AC | PRN
  Administered 2015-03-06: 14 mL via INTRAVENOUS

## 2015-03-06 NOTE — Telephone Encounter (Signed)
Discussed MRI showing punctate left parietal infarct. It would not clearly cause the symptoms he came in for. Infarct likely due to small vessel disease, discussed checking lipid panel, he states he knows it is high and at his age, he did not want to start any medication (he had side effects). Discussed continuation of Plavix, and that if symptoms change, to go to ER immediately. Discussed it would not clearly cause the symptoms he came in for, would still proceed with 24-hour EEG. He expressed agreement and states the stroke was likely due to hypoglycemia. He is scheduled for prostate surgery and will let his surgeon know, monitor BP closely during surgery.

## 2015-03-10 ENCOUNTER — Other Ambulatory Visit: Payer: Self-pay

## 2015-03-10 DIAGNOSIS — M0609 Rheumatoid arthritis without rheumatoid factor, multiple sites: Secondary | ICD-10-CM | POA: Diagnosis not present

## 2015-03-10 DIAGNOSIS — M150 Primary generalized (osteo)arthritis: Secondary | ICD-10-CM | POA: Diagnosis not present

## 2015-03-10 MED ORDER — NITROGLYCERIN 0.4 MG SL SUBL: 0.4000 mg | SUBLINGUAL_TABLET | SUBLINGUAL | Status: DC | PRN

## 2015-03-10 NOTE — Telephone Encounter (Signed)
Rx request for Nitrostat 0.4 mg tablet sl-Place 1 tablet under the tongue every 5 minutes as needed.   Pharm:  [facility]  Rx sent to pharmacy

## 2015-03-11 NOTE — Patient Instructions (Signed)
Robert Rowe  03/11/2015   Your procedure is scheduled on:   03/19/2015     Report to [facility] Main  Entrance and follow signs to               [place] at     0700 AM.  Call this number if you have problems the morning of surgery [phone]   Remember:  Do not eat food or drink liquids :After Midnight.     Take these medicines the morning of surgery with A SIP OF WATER:    Flonase, Synthroid, Protonix                                You may not have any metal on your body including hair pins and              piercings  Do not wear jewelry, make-up, lotions, powders or perfumes., deodorant.                           Men may shave face and neck.   Do not bring valuables to the hospital. [place].  Contacts, dentures or bridgework may not be worn into surgery.  Leave suitcase in the car. After surgery it may be brought to your room.    :  Special Instructions: coughing and deep breathing exercises, leg exercises                Please read over the following fact sheets you were given: _____________________________________________________________________             [facility] - Preparing for Surgery Before surgery, you can play an important role.  Because skin is not sterile, your skin needs to be as free of germs as possible.  You can reduce the number of germs on your skin by washing with CHG (chlorahexidine gluconate) soap before surgery.  CHG is an antiseptic cleaner which kills germs and bonds with the skin to continue killing germs even after washing. Please DO NOT use if you have an allergy to CHG or antibacterial soaps.  If your skin becomes reddened/irritated stop using the CHG and inform your nurse when you arrive at Short Stay. Do not shave (including legs and underarms) for at least 48 hours prior to the first CHG shower.  You may shave your face/neck. Please follow these  instructions carefully:  1.  Shower with CHG Soap the night before surgery and the  morning of Surgery.  2.  If you choose to wash your hair, wash your hair first as usual with your  normal  shampoo.  3.  After you shampoo, rinse your hair and body thoroughly to remove the  shampoo.                           4.  Use CHG as you would any other liquid soap.  You can apply chg directly  to the skin and wash                       Gently with a scrungie or clean washcloth.  5.  Apply the CHG  Soap to your body ONLY FROM THE NECK DOWN.   Do not use on face/ open                           Wound or open sores. Avoid contact with eyes, ears mouth and genitals (private parts).                       Wash face,  Genitals (private parts) with your normal soap.             6.  Wash thoroughly, paying special attention to the area where your surgery  will be performed.  7.  Thoroughly rinse your body with warm water from the neck down.  8.  DO NOT shower/wash with your normal soap after using and rinsing off  the CHG Soap.                9.  Pat yourself dry with a clean towel.            10.  Wear clean pajamas.            11.  Place clean sheets on your bed the night of your first shower and do not  sleep with pets. Day of Surgery : Do not apply any lotions/deodorants the morning of surgery.  Please wear clean clothes to the hospital/surgery center.  FAILURE TO FOLLOW THESE INSTRUCTIONS MAY RESULT IN THE CANCELLATION OF YOUR SURGERY PATIENT SIGNATURE_________________________________  NURSE SIGNATURE__________________________________  ________________________________________________________________________

## 2015-03-12 ENCOUNTER — Encounter (HOSPITAL_COMMUNITY): Payer: Self-pay

## 2015-03-12 ENCOUNTER — Telehealth: Payer: Self-pay | Admitting: *Deleted

## 2015-03-12 ENCOUNTER — Other Ambulatory Visit: Payer: Self-pay

## 2015-03-12 ENCOUNTER — Encounter (HOSPITAL_COMMUNITY)
Admission: RE | Admit: 2015-03-12 | Discharge: 2015-03-12 | Disposition: A | Discharge status: Home / Self Care | Payer: Medicare Other | Source: Ambulatory Visit | Attending: Urology | Admitting: Urology

## 2015-03-12 DIAGNOSIS — Z01818 Encounter for other preprocedural examination: Secondary | ICD-10-CM | POA: Insufficient documentation

## 2015-03-12 HISTORY — DX: Hypothyroidism, unspecified: E03.9

## 2015-03-12 HISTORY — DX: Cardiac arrhythmia, unspecified: I49.9

## 2015-03-12 LAB — BASIC METABOLIC PANEL
Anion gap: 8 (ref 5–15)
BUN: 23 mg/dL (ref 6–23)
CO2: 29 mmol/L (ref 19–32)
Calcium: 9.5 mg/dL (ref 8.4–10.5)
Chloride: 104 mmol/L (ref 96–112)
Creatinine, Ser: 1.6 mg/dL — ABNORMAL HIGH (ref 0.50–1.35)
GFR calc Af Amer: 41 mL/min — ABNORMAL LOW (ref >90)
GFR calc non Af Amer: 35 mL/min — ABNORMAL LOW (ref >90)
Glucose, Bld: 121 mg/dL — ABNORMAL HIGH (ref 70–99)
Potassium: 4.8 mmol/L (ref 3.5–5.1)
Sodium: 141 mmol/L (ref 135–145)

## 2015-03-12 LAB — CBC
HCT: 45.2 % (ref 39.0–52.0)
Hemoglobin: 14.6 g/dL (ref 13.0–17.0)
MCH: 32.4 pg (ref 26.0–34.0)
MCHC: 32.3 g/dL (ref 30.0–36.0)
MCV: 100.4 fL — ABNORMAL HIGH (ref 78.0–100.0)
Platelets: 230 10*3/uL (ref 150–400)
RBC: 4.5 MIL/uL (ref 4.22–5.81)
RDW: 13.8 % (ref 11.5–15.5)
WBC: 5.8 10*3/uL (ref 4.0–10.5)

## 2015-03-12 MED ORDER — METOPROLOL SUCCINATE ER 25 MG PO TB24: 25.0000 mg | ORAL_TABLET | Freq: Every day | ORAL | Status: DC

## 2015-03-12 NOTE — Telephone Encounter (Signed)
Patient would like to speak with Dr. Delice Lesch about his MRI before he has surgery  Call back number [phone]

## 2015-03-12 NOTE — Progress Notes (Signed)
Patient had episode of ? Loss of consciousness or falling asleep" on 02/22/2015.  Lasted for approximately 2 minutes per patient at home around 2000pm.  Patient was seen by Neurology and MRI Brain and EEG performed.  Results in EPIC.  Patient was seen today on preop appointment and alert and oriented x 4.  Patient is aware that he is getting older and has some memory issues.  Patient able to answer all questions with detail at time of preop appointment.  No deficits noted.  Patient states Dr Gaynelle Arabian is aware of episode listed above on 02/22/2015 after a meeting .  Patient stated prior to meeting he had not had anything to eat.  Patient has received no clearances for surgery.  Called Dr Marcell Barlow( anesthesia ) and spoke with him .  Informed Dr Marcell Barlow of patient status at time of preop appointment and that patient had been seen by Neurology and EEG and MRI of brain performed  Results of MRI of brain given to Dr Marcell Barlow.  Patient stated he was going to have a spinal for surgery for anesthesia .  Patient on Plavix and has preop instructions regarding Plavix.  No further orders given by anesthesia.  Dr Marcell Barlow stated they would see patient day of surgery.

## 2015-03-12 NOTE — Progress Notes (Signed)
BMP results done 03/12/15 faxed via EPIC to Dr Gaynelle Arabian.

## 2015-03-12 NOTE — Telephone Encounter (Signed)
Darlin Coco, MD at 11/10/2014 10:54 AM  metoprolol succinate (TOPROL-XL) 25 MG 24 hr tablet Take 1 tablet (25 mg total) by mouth at bedtime          Patient Instructions     Your physician recommends that you continue on your current medications as directed. Please refer to the Current Medication list given to you today.

## 2015-03-12 NOTE — Progress Notes (Signed)
EKG- 11/10/14 EPIC  Stress Test 2014 EPIC  ECHO- 2014 EPIC  MRI Brain 03/06/2015 EPIC  EEG- 02/2015 EPIC  LOV with Dr [name]- 11/10/14 EPIC  LOV with Neurology- 03/02/2015 EPIC

## 2015-03-12 NOTE — Telephone Encounter (Signed)
Spoke to [name], he is not having general anesthesia for the TURP. He had a punctate left parietal stroke, likely due to small vessel disease. Avoid hypotension, otherwise no other contraindications from neurological standpoint.

## 2015-03-12 NOTE — Telephone Encounter (Signed)
Pt is calling back he really wants to talk with Dr Delice Lesch or someone before he goes for his appt at 10:30 today please call [phone]

## 2015-03-12 NOTE — Telephone Encounter (Signed)
He wants to know if you think there is any reason he shouldn't go through with his upcoming surgery with what his mri shows.

## 2015-03-13 NOTE — Telephone Encounter (Signed)
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 Called patient back to remind him that I did speak to him at noon yesterday, he said he could not recall what I had discussed with him. He again expressed concern about the small stroke and surgery, discussed avoiding hypotension. He has noticed more memory changes in the past 2-3 months and asks if this is due to the stroke, I told him it is very small (punctate). He feels symptoms are due to hypoglycemia, discussed eating small frequent meals. He will f/u as scheduled.  Tiff, fyi. Thanks

## 2015-03-16 ENCOUNTER — Inpatient Hospital Stay (HOSPITAL_COMMUNITY): Admission: RE | Admit: 2015-03-16 | Payer: Medicare Other | Source: Ambulatory Visit

## 2015-03-17 ENCOUNTER — Telehealth: Payer: Self-pay | Admitting: *Deleted

## 2015-03-17 NOTE — Telephone Encounter (Signed)
Patient calling regarding MRI  Call back number [phone]

## 2015-03-17 NOTE — Telephone Encounter (Signed)
error 

## 2015-03-18 NOTE — Telephone Encounter (Signed)
Left VM on number listed for patient to call back.

## 2015-03-18 NOTE — H&P (Signed)
Reason For Visit Cystoscopy, flowrate, PVR & PUS   Active Problems Problems  1. Benign prostatic hyperplasia with urinary obstruction (N40.1,N13.8) 2. Elevated prostate specific antigen (PSA) (R97.2) 3. Overactive bladder (N32.81) 4. 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 He has a past history of BPH and elevated PSA. The patient has bladder symptom score sheet of 18/7, which is unchanged since August 2007; nocturia x 1.       A new problem is urge and urge incontinence for which he would like to be treated. The patient has had some difficulty with voiding in the past and is status post Targis microwave thermotherapy in 03-11-99 for BPH symptoms. Tried Enablex, but had some blurred vision. Severe constipation. IPSS=8. He does not want anticholinergics because of constipation side effects.   Past Medical History Problems  1. History of Arthritis 2. History of esophageal reflux (Z87.19) 3. History of heartburn (Z87.898) 4. History of hypercholesterolemia (Z86.39) 5. History of Ischemic Stroke 6. History of Rheumatoid arthritis (M06.9) 7. History of Sprained Hamstring Insertion 8. History of Thyroid Disorder  Surgical History Problems  1. History of Cataract Surgery 2. History of Hemorrhoidectomy 3. History of Inguinal Hernia Repair 4. History of Rotator Cuff Repair  Current Meds 1. Finasteride 5 MG Oral Tablet; TAKE ONE TABLET BY MOUTH DAILY;  Therapy: [id number] to (Last Rx:02Feb2016)  Requested for: 02Feb2016 Ordered 2. Meloxicam 7.5 MG Oral Tablet;  Therapy: [id number] to Recorded 3. Methotrexate TABS;  Therapy: (Recorded:11Nov2009) to Recorded 4. Metoprolol Succinate ER 25 MG Oral Tablet Extended Release 24 Hour;  Therapy: [id number] to Recorded 5. Pantoprazole Sodium 40 MG Oral Tablet Delayed  Release;  Therapy: [id number] to Recorded 6. Plavix TABS;  Therapy: (Recorded:28Oct2008) to Recorded 7. Synthroid 50 MCG Oral Tablet;  Therapy: (Recorded:02Feb2016) to Recorded 8. Tamsulosin HCl - 0.4 MG Oral Capsule; TAKE 1 CAPSULE Bedtime;  Therapy: [id number] to (Last Rx:02Feb2016)  Requested for: 02Feb2016 Ordered  Allergies Medication  1. Enablex TB24 2. Rapaflo CAPS 3. Sulfa Drugs  Family History Problems  1. Family history of Death In The Family Father   82-heart attack 2. Family history of Death In The Family Mother   100 3. Family history of Family Health Status Number Of Children   3 sons and 1 daughter  Social History Problems  1. Denied: Alcohol Use 2. Caffeine Use   4 per day 3. Former Smoker   quit 46 yrs ago 4. Marital History - Currently Married 5. Denied: Occupation:   retired 36. Denied: Tobacco Use  Review of Systems Genitourinary, constitutional, skin, eye, otolaryngeal, hematologic/lymphatic, cardiovascular, pulmonary, endocrine, musculoskeletal, gastrointestinal, neurological and psychiatric system(s) were reviewed and pertinent findings if present are noted and are otherwise negative.  Genitourinary: urinary frequency, feelings of urinary urgency, nocturia, incontinence, weak urinary stream, urinary stream starts and stops and incomplete emptying of bladder.    Vitals Vital Signs [Data Includes: Last 1 Day]  Recorded: [id number] 12:40PM  Blood Pressure: 182 / 104 Temperature: 96.9 F Heart Rate: 86  Physical Exam Constitutional: Well nourished and well developed . No acute distress. The patient appears well hydrated.  ENT:. The ears and nose are normal in appearance. Hearing loss is noted.  Neck: The appearance of the neck is normal.  Pulmonary: No respiratory distress.  Cardiovascular:. No peripheral edema.  Abdomen: The abdomen is flat. The abdomen  is soft and nontender. No masses are palpated. The abdomen is normal to percussion. No  hernias are palpable.  Rectal: Rectal exam demonstrates normal sphincter tone, the anus is normal on inspection., no tenderness and no masses. Estimated prostate size is 3+. Normal rectal tone, no rectal masses, prostate is smooth, symmetric and non-tender. The prostate has no nodularity and is not tender. The left seminal vesicle is nonpalpable. The right seminal vesicle is nonpalpable. The perineum is normal on inspection.  Genitourinary: Examination of the penis demonstrates no discharge, no masses, no lesions and a normal meatus. The penis is circumcised. The scrotum is without lesions. The right epididymis is palpably normal and non-tender. The left epididymis is palpably normal and non-tender. The right testis is non-tender and without masses. The left testis is non-tender and without masses.  Lymphatics: The femoral and inguinal nodes are not enlarged or tender.  Skin: Normal skin turgor, no visible rash and no visible skin lesions.  Neuro/Psych:. Mood and affect are appropriate.    Results/Data  03 Feb 2015 10:36 AM  UA With REFLEX    COLOR YELLOW     APPEARANCE CLEAR     SPECIFIC GRAVITY 1.010     pH 6.0     GLUCOSE NEG     BILIRUBIN NEG     KETONE NEG     BLOOD NEG     PROTEIN NEG     UROBILINOGEN 0.2     NITRITE NEG     LEUKOCYTE ESTERASE NEG   Flow Rate: Voided 211 ml. A peak flow rate of 45ml/s and mean flow rate of 31ml/s.  PVR: Ultrasound PVR 21 ml.    Procedure Flow Rate: peak : 13cc/sec. pvr=21cc, PUS= 63cc gland.   Procedure: Cystoscopy  Chaperone Present: kim lewis.  Indication: Lower Urinary Tract Symptoms.  Informed Consent: Risks, benefits, and potential adverse events were discussed and informed consent was obtained from the patient.  Prep: The patient was prepped with betadine.  Anesthesia:. Local anesthesia was administered intraurethrally with 2% lidocaine jelly.  Antibiotic prophylaxis: Ciprofloxacin.  Procedure Note:  Urethral meatus:. No abnormalities.   Anterior urethra: No abnormalities.  Prostatic urethra:. There was visual obstruction of the prostatic urethra. No intravesical median lobe was visualized. Enlarged L lateral lobe of prostae with obstruction.  Bladder: Visulization was clear. The ureteral orifices were in the normal anatomic position bilaterally. A systematic survey of the bladder demonstrated no bladder tumors or stones. The patient tolerated the procedure well.  Complications: None.    Assessment Assessed  1. Benign prostatic hyperplasia with urinary obstruction (N40.1,N13.8) 2. Elevated prostate specific antigen (PSA) (R97.2) 3. Overactive bladder (N32.81)  Significant bladder outlet obswtruction from enlarged Left lateral lobe of the prostate. He needs resection, and will need 24-48 hrs of hospitalization. ( 81 yrs old)   Plan TURP.   Discussion/Summary cc: Dr. Chancy Hurter     Signatures Electronically signed by : Carolan Clines, M.D.; Feb 03 2015  6:33PM EST

## 2015-03-18 NOTE — Telephone Encounter (Signed)
I spoke with [name]. He wants to know what you think about his MRI.

## 2015-03-19 ENCOUNTER — Inpatient Hospital Stay (HOSPITAL_COMMUNITY): Payer: Medicare Other | Admitting: Anesthesiology

## 2015-03-19 ENCOUNTER — Encounter (HOSPITAL_COMMUNITY)
Admission: RE | Disposition: A | Discharge status: Home / Self Care | Payer: Self-pay | Source: Ambulatory Visit | Attending: Urology

## 2015-03-19 ENCOUNTER — Observation Stay (HOSPITAL_COMMUNITY)
Admission: RE | Admit: 2015-03-19 | Discharge: 2015-03-20 | Disposition: A | Discharge status: Home / Self Care | Payer: Medicare Other | Source: Ambulatory Visit | Attending: Urology | Admitting: Urology

## 2015-03-19 ENCOUNTER — Encounter (HOSPITAL_COMMUNITY): Payer: Self-pay | Admitting: *Deleted

## 2015-03-19 DIAGNOSIS — Z7902 Long term (current) use of antithrombotics/antiplatelets: Secondary | ICD-10-CM | POA: Insufficient documentation

## 2015-03-19 DIAGNOSIS — N189 Chronic kidney disease, unspecified: Secondary | ICD-10-CM | POA: Diagnosis not present

## 2015-03-19 DIAGNOSIS — R3912 Poor urinary stream: Secondary | ICD-10-CM | POA: Insufficient documentation

## 2015-03-19 DIAGNOSIS — R351 Nocturia: Secondary | ICD-10-CM | POA: Diagnosis not present

## 2015-03-19 DIAGNOSIS — E039 Hypothyroidism, unspecified: Secondary | ICD-10-CM | POA: Insufficient documentation

## 2015-03-19 DIAGNOSIS — Z87891 Personal history of nicotine dependence: Secondary | ICD-10-CM | POA: Insufficient documentation

## 2015-03-19 DIAGNOSIS — N138 Other obstructive and reflux uropathy: Secondary | ICD-10-CM | POA: Insufficient documentation

## 2015-03-19 DIAGNOSIS — E780 Pure hypercholesterolemia: Secondary | ICD-10-CM | POA: Diagnosis not present

## 2015-03-19 DIAGNOSIS — N401 Enlarged prostate with lower urinary tract symptoms: Principal | ICD-10-CM | POA: Insufficient documentation

## 2015-03-19 DIAGNOSIS — N3281 Overactive bladder: Secondary | ICD-10-CM | POA: Diagnosis not present

## 2015-03-19 DIAGNOSIS — M069 Rheumatoid arthritis, unspecified: Secondary | ICD-10-CM | POA: Diagnosis not present

## 2015-03-19 DIAGNOSIS — N39498 Other specified urinary incontinence: Secondary | ICD-10-CM | POA: Insufficient documentation

## 2015-03-19 DIAGNOSIS — Z8673 Personal history of transient ischemic attack (TIA), and cerebral infarction without residual deficits: Secondary | ICD-10-CM | POA: Diagnosis not present

## 2015-03-19 DIAGNOSIS — R3915 Urgency of urination: Secondary | ICD-10-CM | POA: Diagnosis not present

## 2015-03-19 DIAGNOSIS — K219 Gastro-esophageal reflux disease without esophagitis: Secondary | ICD-10-CM | POA: Insufficient documentation

## 2015-03-19 DIAGNOSIS — R3914 Feeling of incomplete bladder emptying: Secondary | ICD-10-CM | POA: Diagnosis not present

## 2015-03-19 DIAGNOSIS — N3941 Urge incontinence: Secondary | ICD-10-CM | POA: Diagnosis present

## 2015-03-19 DIAGNOSIS — Z79899 Other long term (current) drug therapy: Secondary | ICD-10-CM | POA: Diagnosis not present

## 2015-03-19 DIAGNOSIS — N400 Enlarged prostate without lower urinary tract symptoms: Secondary | ICD-10-CM | POA: Diagnosis not present

## 2015-03-19 HISTORY — PX: TRANSURETHRAL RESECTION OF PROSTATE: SHX73

## 2015-03-19 LAB — GLUCOSE, CAPILLARY: Glucose-Capillary: 105 mg/dL — ABNORMAL HIGH (ref 70–99)

## 2015-03-19 SURGERY — TURP (TRANSURETHRAL RESECTION OF PROSTATE)
Anesthesia: Spinal

## 2015-03-19 MED ORDER — PROMETHAZINE HCL 25 MG/ML IJ SOLN
6.2500 mg | INTRAMUSCULAR | Status: DC | PRN
Start: 2015-03-19 — End: 2015-03-19

## 2015-03-19 MED ORDER — SODIUM CHLORIDE 0.9 % IR SOLN
Status: DC | PRN
  Administered 2015-03-19: 15000 mL

## 2015-03-19 MED ORDER — CIPROFLOXACIN HCL 250 MG PO TABS: 250.0000 mg | ORAL_TABLET | Freq: Two times a day (BID) | ORAL | Status: DC

## 2015-03-19 MED ORDER — PROPOFOL 10 MG/ML IV BOLUS
INTRAVENOUS | Status: AC
  Filled 2015-03-19: qty 20

## 2015-03-19 MED ORDER — ZOLPIDEM TARTRATE 5 MG PO TABS
5.0000 mg | ORAL_TABLET | Freq: Every evening | ORAL | Status: DC | PRN
  Administered 2015-03-19: 5 mg via ORAL
  Filled 2015-03-19: qty 1

## 2015-03-19 MED ORDER — PHENYLEPHRINE HCL 10 MG/ML IJ SOLN: 30.0000 ug/min | INTRAVENOUS | Status: AC

## 2015-03-19 MED ORDER — ONDANSETRON HCL 4 MG/2ML IJ SOLN
INTRAMUSCULAR | Status: AC
  Filled 2015-03-19: qty 2

## 2015-03-19 MED ORDER — METOPROLOL SUCCINATE ER 25 MG PO TB24
25.0000 mg | ORAL_TABLET | Freq: Every day | ORAL | Status: DC
  Administered 2015-03-19: 25 mg via ORAL
  Filled 2015-03-19: qty 1

## 2015-03-19 MED ORDER — FLUTICASONE PROPIONATE 50 MCG/ACT NA SUSP: 2.0000 | Freq: Every day | NASAL | Status: DC

## 2015-03-19 MED ORDER — KETOROLAC TROMETHAMINE 15 MG/ML IJ SOLN
INTRAMUSCULAR | Status: DC | PRN
  Administered 2015-03-19: 15 mg via INTRAVENOUS

## 2015-03-19 MED ORDER — OXYCODONE-ACETAMINOPHEN 5-325 MG PO TABS
1.0000 | ORAL_TABLET | ORAL | Status: DC | PRN
  Administered 2015-03-19: 1 via ORAL
  Filled 2015-03-19: qty 1

## 2015-03-19 MED ORDER — SENNOSIDES-DOCUSATE SODIUM 8.6-50 MG PO TABS
2.0000 | ORAL_TABLET | Freq: Every day | ORAL | Status: DC
  Administered 2015-03-19: 2 via ORAL
  Filled 2015-03-19: qty 2

## 2015-03-19 MED ORDER — FENTANYL CITRATE (PF) 100 MCG/2ML IJ SOLN
INTRAMUSCULAR | Status: DC | PRN
  Administered 2015-03-19: 50 ug via INTRAVENOUS

## 2015-03-19 MED ORDER — DIPHENHYDRAMINE HCL 12.5 MG/5ML PO ELIX: 12.5000 mg | ORAL_SOLUTION | Freq: Four times a day (QID) | ORAL | Status: DC | PRN

## 2015-03-19 MED ORDER — BUPIVACAINE IN DEXTROSE 0.75-8.25 % IT SOLN
INTRATHECAL | Status: DC | PRN
  Administered 2015-03-19: 1.6 mL via INTRATHECAL

## 2015-03-19 MED ORDER — ONDANSETRON HCL 4 MG/2ML IJ SOLN: 4.0000 mg | INTRAMUSCULAR | Status: DC | PRN

## 2015-03-19 MED ORDER — CEFAZOLIN SODIUM-DEXTROSE 2-3 GM-% IV SOLR
2.0000 g | INTRAVENOUS | Status: AC
  Administered 2015-03-19: 2 g via INTRAVENOUS

## 2015-03-19 MED ORDER — PROPOFOL INFUSION 10 MG/ML OPTIME
INTRAVENOUS | Status: DC | PRN
  Administered 2015-03-19: 60 ug/kg/min via INTRAVENOUS

## 2015-03-19 MED ORDER — LEVOTHYROXINE SODIUM 50 MCG PO TABS
50.0000 ug | ORAL_TABLET | Freq: Every day | ORAL | Status: DC
  Administered 2015-03-20: 50 ug via ORAL
  Filled 2015-03-19: qty 1

## 2015-03-19 MED ORDER — SODIUM CHLORIDE 0.9 % IV SOLN
10.0000 mg | INTRAVENOUS | Status: DC | PRN
  Administered 2015-03-19: 15 ug/min via INTRAVENOUS

## 2015-03-19 MED ORDER — KETOROLAC TROMETHAMINE 30 MG/ML IJ SOLN: 15.0000 mg | Freq: Once | INTRAMUSCULAR | Status: DC

## 2015-03-19 MED ORDER — LACTATED RINGERS IV SOLN
INTRAVENOUS | Status: DC
  Administered 2015-03-19: 1000 mL via INTRAVENOUS

## 2015-03-19 MED ORDER — ADULT MULTIVITAMIN W/MINERALS CH
1.0000 | ORAL_TABLET | Freq: Every day | ORAL | Status: DC
  Administered 2015-03-20: 1 via ORAL
  Filled 2015-03-19: qty 1

## 2015-03-19 MED ORDER — CEFAZOLIN SODIUM-DEXTROSE 2-3 GM-% IV SOLR
INTRAVENOUS | Status: AC
  Filled 2015-03-19: qty 50

## 2015-03-19 MED ORDER — SODIUM CHLORIDE 0.45 % IV SOLN
INTRAVENOUS | Status: DC
Start: 2015-03-19 — End: 2015-03-20
  Administered 2015-03-19: 13:00:00 via INTRAVENOUS

## 2015-03-19 MED ORDER — CIPROFLOXACIN HCL 500 MG PO TABS
500.0000 mg | ORAL_TABLET | Freq: Every day | ORAL | Status: DC
  Administered 2015-03-19: 500 mg via ORAL
  Filled 2015-03-19: qty 1

## 2015-03-19 MED ORDER — DEXAMETHASONE SODIUM PHOSPHATE 10 MG/ML IJ SOLN
INTRAMUSCULAR | Status: AC
  Filled 2015-03-19: qty 1

## 2015-03-19 MED ORDER — PANTOPRAZOLE SODIUM 40 MG PO TBEC
40.0000 mg | DELAYED_RELEASE_TABLET | Freq: Every day | ORAL | Status: DC
  Administered 2015-03-20: 40 mg via ORAL
  Filled 2015-03-19: qty 1

## 2015-03-19 MED ORDER — SODIUM CHLORIDE 0.9 % IR SOLN
3000.0000 mL | Status: DC
  Administered 2015-03-19: 3000 mL

## 2015-03-19 MED ORDER — CIPROFLOXACIN HCL 500 MG PO TABS
500.0000 mg | ORAL_TABLET | Freq: Two times a day (BID) | ORAL | Status: DC
Start: 2015-03-19 — End: 2015-03-19

## 2015-03-19 MED ORDER — NITROGLYCERIN 0.4 MG SL SUBL: 0.4000 mg | SUBLINGUAL_TABLET | SUBLINGUAL | Status: DC | PRN

## 2015-03-19 MED ORDER — OXYCODONE-ACETAMINOPHEN 5-325 MG PO TABS: 1.0000 | ORAL_TABLET | ORAL | Status: DC | PRN

## 2015-03-19 MED ORDER — DOCUSATE SODIUM 100 MG PO CAPS: 100.0000 mg | ORAL_CAPSULE | Freq: Two times a day (BID) | ORAL | Status: DC

## 2015-03-19 MED ORDER — FENTANYL CITRATE (PF) 100 MCG/2ML IJ SOLN
INTRAMUSCULAR | Status: AC
  Filled 2015-03-19: qty 2

## 2015-03-19 MED ORDER — DIPHENHYDRAMINE HCL 50 MG/ML IJ SOLN: 12.5000 mg | Freq: Four times a day (QID) | INTRAMUSCULAR | Status: DC | PRN

## 2015-03-19 MED ORDER — ACETAMINOPHEN 10 MG/ML IV SOLN
1000.0000 mg | Freq: Once | INTRAVENOUS | Status: AC
  Administered 2015-03-19: 1000 mg via INTRAVENOUS
  Filled 2015-03-19: qty 100

## 2015-03-19 MED ORDER — ACETAMINOPHEN 10 MG/ML IV SOLN
1000.0000 mg | Freq: Once | INTRAVENOUS | Status: DC
  Administered 2015-03-19: 1000 mg via INTRAVENOUS
  Filled 2015-03-19: qty 100

## 2015-03-19 MED ORDER — ACETAMINOPHEN 325 MG PO TABS: 650.0000 mg | ORAL_TABLET | ORAL | Status: DC | PRN

## 2015-03-19 MED ORDER — FOLIC ACID 1 MG PO TABS
1.0000 mg | ORAL_TABLET | Freq: Every day | ORAL | Status: DC
Start: 2015-03-20 — End: 2015-03-20
  Administered 2015-03-20: 1 mg via ORAL
  Filled 2015-03-19: qty 1

## 2015-03-19 MED ORDER — BACITRACIN-NEOMYCIN-POLYMYXIN 400-5-5000 EX OINT: 1.0000 "application " | TOPICAL_OINTMENT | Freq: Three times a day (TID) | CUTANEOUS | Status: DC | PRN

## 2015-03-19 MED ORDER — MELOXICAM 7.5 MG PO TABS
7.5000 mg | ORAL_TABLET | Freq: Every day | ORAL | Status: DC
  Administered 2015-03-20: 7.5 mg via ORAL
  Filled 2015-03-19: qty 1

## 2015-03-19 MED ORDER — FENTANYL CITRATE (PF) 100 MCG/2ML IJ SOLN: 25.0000 ug | INTRAMUSCULAR | Status: DC | PRN

## 2015-03-19 SURGICAL SUPPLY — 15 items
BAG URINE DRAINAGE (UROLOGICAL SUPPLIES) ×2 IMPLANT
BAG URO CATCHER STRL LF (DRAPE) ×2 IMPLANT
CATH HEMA 3WAY 30CC 22FR COUDE (CATHETERS) ×2 IMPLANT
CLOTH BEACON ORANGE TIMEOUT ST (SAFETY) ×2 IMPLANT
GLOVE BIOGEL M STRL SZ7.5 (GLOVE) ×10 IMPLANT
GOWN STRL REUS W/TWL LRG LVL3 (GOWN DISPOSABLE) ×4 IMPLANT
GOWN STRL REUS W/TWL XL LVL3 (GOWN DISPOSABLE) ×2 IMPLANT
HOLDER FOLEY CATH W/STRAP (MISCELLANEOUS) ×2 IMPLANT
KIT ASPIRATION TUBING (SET/KITS/TRAYS/PACK) IMPLANT
LOOP CUT BIPOLAR 24F LRG (ELECTROSURGICAL) ×2 IMPLANT
MANIFOLD NEPTUNE II (INSTRUMENTS) ×2 IMPLANT
PACK CYSTO (CUSTOM PROCEDURE TRAY) ×2 IMPLANT
SYR 30ML LL (SYRINGE) ×2 IMPLANT
SYRINGE IRR TOOMEY STRL 70CC (SYRINGE) ×2 IMPLANT
TUBING CONNECTING 10 (TUBING) ×4 IMPLANT

## 2015-03-19 NOTE — Anesthesia Procedure Notes (Signed)
Spinal Patient location during procedure: OR Staffing Performed by: anesthesiologist  Preanesthetic Checklist Completed: patient identified, site marked, surgical consent, pre-op evaluation, timeout performed, IV checked, risks and benefits discussed and monitors and equipment checked Spinal Block Patient position: sitting Prep: Betadine Patient monitoring: heart rate, continuous pulse ox and blood pressure Injection technique: single-shot Needle Needle type: Sprotte and Spinocan  Needle gauge: 22 G Needle length: 9 cm Additional Notes Expiration date of kit checked and confirmed. Patient tolerated procedure well, without complications.

## 2015-03-19 NOTE — Interval H&P Note (Signed)
History and Physical Interval Note:  03/19/2015 9:03 AM  Robert Rowe  has presented today for surgery, with the diagnosis of BENIGN PROSTATIC HYPERPLASIA  The various methods of treatment have been discussed with the patient and family. After consideration of risks, benefits and other options for treatment, the patient has consented to  Procedure(s): TRANSURETHRAL RESECTION OF THE PROSTATE (TURP) (N/A) as a surgical intervention .  The patient's history has been reviewed, patient examined, no change in status, stable for surgery.  I have reviewed the patient's chart and labs.  Questions were answered to the patient's satisfaction.     Annya Lizana I Oneal Biglow

## 2015-03-19 NOTE — Op Note (Signed)
Pre-operative diagnosis :  BPH  Postoperative diagnosis:  Same  Operation:  Robert Rowe TURP  Surgeon:  S. Gaynelle Arabian, MD  First assistant:  None  Anesthesia:  Spinal  Preparation:  After appropriate preanesthesia, the patient was brought the operating, placed on the operating table in the right lateral decubitus position where spinal anesthetic was introduced. He was then replaced in the dorsal lithotomy position with the pubis was prepped with Betadine solution and draped in usual fashion. The arm and was double checked. The history was double checked. Noted the patient had migrated thermotherapy in 2002.  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 He has a past history of BPH and elevated PSA. The patient has bladder symptom score sheet of 18/7, which is unchanged since August 2007; nocturia x 1.      A new problem is urge and urge incontinence for which he would like to be treated. The patient has had some difficulty with voiding in the past and is status post Targis microwave thermotherapy in 2000 for BPH symptoms. Tried Enablex, but had some blurred vision. Severe constipation. IPSS=8. He does not want anticholinergics because of constipation side effects.   Past Medical History Problems  1. History of Arthritis 2. History of esophageal reflux (Z87.19) 3. History of heartburn (Z87.898) 4. History of hypercholesterolemia (Z86.39) 5. History of Ischemic Stroke 6. History of Rheumatoid arthritis (M06.9) 7. History of Sprained Hamstring Insertion 8. History of Thyroid Disorder  Statement of  Likelihood of Success: Excellent. TIME-OUT observed.:  Procedure:  Cystourethroscopy was accomplished. The patient had bilobar BPH, left greater than right. He had some elevation his bladder neck as well. The bladder itself showed normal trigone, with clear reflux from both  cortices. Photo documentation was accomplished, showing trabeculation which was widespread. There was no evidence of trabeculation. There was no evidence of bladder stone, tumor, or diverticular formation.  Using the [facility] instrumentation, Rowe transurethral resection was accomplished from the 5:00 to the 7:00 positions, from the 11:00 to the 7:00 position, and from the 1:00 to the 5:00 positions. Extensive cauterization was accomplished to avoid postoperative bleeding. All chips were evacuated from the bladder, and a second timeout was observed, noted to give the chips to the skin rub nurse for pathologic interpretation.  A second look was accomplished, with re-resection of more tissue from the right lateral lobe, and this tissue was also included in the tissue sent to the laboratory. Following this, a 22 hematuria catheter with coud tip was passed atraumatically into the bladder, with a 30 mL balloon inflated with sterile water. Traction and CBI were placed, and will be discarded when necessary in recovery room and on the floor.  The patient received IV Tylenol, IV Toradol. He was awakened and taken to recovery in good condition. He did not receive a B and O Spicer. (Intolerance to anticholinergics, and use of spinal anesthetic impresses. He did receive IV antibiotic coverage.

## 2015-03-19 NOTE — Care Management Note (Signed)
    Page 1 of 1   03/19/2015     3:51:27 PM CARE MANAGEMENT NOTE 03/19/2015  Patient:  Robert Rowe, Robert Rowe   Account Number:  [id number]  Date Initiated:  03/19/2015  Documentation initiated by:  Dessa Phi  Subjective/Objective Assessment:   81 y/o m admitted w/BPH.     Action/Plan:   From home.   Anticipated DC Date:  03/20/2015   Anticipated DC Plan:  [place]  CM consult      Choice offered to / List presented to:             Status of service:  In process, will continue to follow Medicare Important Message given?   (If response is "NO", the following Medicare IM given date fields will be blank) Date Medicare IM given:   Medicare IM given by:   Date Additional Medicare IM given:   Additional Medicare IM given by:    Discharge Disposition:    Per UR Regulation:  Reviewed for med. necessity/level of care/duration of stay  If discussed at [place] of Stay Meetings, dates discussed:    Comments:  03/19/15 Dessa Phi RN BSN NCM [phone] s/p TURP.Monitor progress for d/c needs.

## 2015-03-19 NOTE — Anesthesia Postprocedure Evaluation (Signed)
  Anesthesia Post-op Note  Patient: Robert Rowe  Procedure(s) Performed: Procedure(s) (LRB): TRANSURETHRAL RESECTION OF THE PROSTATE (TURP) (N/A)  Patient Location: PACU  Anesthesia Type: General  Level of Consciousness: awake and alert   Airway and Oxygen Therapy: Patient Spontanous Breathing  Post-op Pain: mild  Post-op Assessment: Post-op Vital signs reviewed, Patient's Cardiovascular Status Stable, Respiratory Function Stable, Patent Airway and No signs of Nausea or vomiting  Last Vitals:  Filed Vitals:   03/19/15 1145  BP: 103/60  Pulse: 70  Temp:   Resp: 14    Post-op Vital Signs: stable   Complications: No apparent anesthesia complications

## 2015-03-19 NOTE — Progress Notes (Signed)
Pharmacy consult for Antibiotic Renal Dose Adjustment  Cipro 500 mg BID ordered. CrCl~23 ml/min  Plan: Adjust Cipro to 500 mg PO daily for CrCl<30 ml/min.   Hershal Coria, PharmD, BCPS Pager: [phone] 03/19/2015 1:57 PM

## 2015-03-19 NOTE — Anesthesia Preprocedure Evaluation (Addendum)
Anesthesia Evaluation  Patient identified by MRN, date of birth, ID band Patient awake    Reviewed: Allergy & Precautions, NPO status , Patient's Chart, lab work & pertinent test results  Airway Mallampati: II  TM Distance: >3 FB Neck ROM: Full    Dental no notable dental hx.    Pulmonary neg pulmonary ROS, former smoker,  breath sounds clear to auscultation  Pulmonary exam normal       Cardiovascular Rhythm:Regular Rate:Normal     Neuro/Psych TIAnegative psych ROS   GI/Hepatic Neg liver ROS, GERD-  ,  Endo/Other  Hypothyroidism   Renal/GU negative Renal ROS  negative genitourinary   Musculoskeletal  (+) Arthritis -, Rheumatoid disorders,    Abdominal   Peds negative pediatric ROS (+)  Hematology negative hematology ROS (+)   Anesthesia Other Findings   Reproductive/Obstetrics negative OB ROS                            Anesthesia Physical Anesthesia Plan  ASA: III  Anesthesia Plan: Spinal   Post-op Pain Management:    Induction: Intravenous  Airway Management Planned: Simple Face Mask  Additional Equipment:   Intra-op Plan:   Post-operative Plan: Extubation in OR  Informed Consent: I have reviewed the patients History and Physical, chart, labs and discussed the procedure including the risks, benefits and alternatives for the proposed anesthesia with the patient or authorized representative who has indicated his/her understanding and acceptance.   Dental advisory given  Plan Discussed with: CRNA and Surgeon  Anesthesia Plan Comments:        Anesthesia Quick Evaluation

## 2015-03-19 NOTE — Transfer of Care (Signed)
Immediate Anesthesia Transfer of Care Note  Patient: Robert Rowe  Procedure(s) Performed: Procedure(s): TRANSURETHRAL RESECTION OF THE PROSTATE (TURP) (N/A)  Patient Location: PACU  Anesthesia Type:Spinal  Level of Consciousness: sedated  Airway & Oxygen Therapy: Patient Spontanous Breathing and Patient connected to face mask oxygen  Post-op Assessment: Report given to RN and Post -op Vital signs reviewed and stable  Post vital signs: Reviewed and stable  Last Vitals:  Filed Vitals:   03/19/15 0652  BP: 152/84  Pulse: 83  Temp: 36.5 C  Resp: 16    Complications: No apparent anesthesia complications

## 2015-03-20 ENCOUNTER — Encounter (HOSPITAL_COMMUNITY): Payer: Self-pay | Admitting: Urology

## 2015-03-20 DIAGNOSIS — M069 Rheumatoid arthritis, unspecified: Secondary | ICD-10-CM | POA: Diagnosis not present

## 2015-03-20 DIAGNOSIS — Z7902 Long term (current) use of antithrombotics/antiplatelets: Secondary | ICD-10-CM | POA: Diagnosis not present

## 2015-03-20 DIAGNOSIS — R3914 Feeling of incomplete bladder emptying: Secondary | ICD-10-CM | POA: Diagnosis not present

## 2015-03-20 DIAGNOSIS — K219 Gastro-esophageal reflux disease without esophagitis: Secondary | ICD-10-CM | POA: Diagnosis not present

## 2015-03-20 DIAGNOSIS — N3281 Overactive bladder: Secondary | ICD-10-CM | POA: Diagnosis not present

## 2015-03-20 DIAGNOSIS — N401 Enlarged prostate with lower urinary tract symptoms: Secondary | ICD-10-CM | POA: Diagnosis not present

## 2015-03-20 DIAGNOSIS — R3915 Urgency of urination: Secondary | ICD-10-CM | POA: Diagnosis not present

## 2015-03-20 DIAGNOSIS — N138 Other obstructive and reflux uropathy: Secondary | ICD-10-CM | POA: Diagnosis not present

## 2015-03-20 DIAGNOSIS — Z79899 Other long term (current) drug therapy: Secondary | ICD-10-CM | POA: Diagnosis not present

## 2015-03-20 DIAGNOSIS — N39498 Other specified urinary incontinence: Secondary | ICD-10-CM | POA: Diagnosis not present

## 2015-03-20 DIAGNOSIS — Z87891 Personal history of nicotine dependence: Secondary | ICD-10-CM | POA: Diagnosis not present

## 2015-03-20 DIAGNOSIS — E780 Pure hypercholesterolemia: Secondary | ICD-10-CM | POA: Diagnosis not present

## 2015-03-20 DIAGNOSIS — Z8673 Personal history of transient ischemic attack (TIA), and cerebral infarction without residual deficits: Secondary | ICD-10-CM | POA: Diagnosis not present

## 2015-03-20 DIAGNOSIS — E039 Hypothyroidism, unspecified: Secondary | ICD-10-CM | POA: Diagnosis not present

## 2015-03-20 DIAGNOSIS — R3912 Poor urinary stream: Secondary | ICD-10-CM | POA: Diagnosis not present

## 2015-03-20 DIAGNOSIS — R351 Nocturia: Secondary | ICD-10-CM | POA: Diagnosis not present

## 2015-03-20 NOTE — Discharge Summary (Signed)
Physician Discharge Summary  Patient ID: Robert Rowe MRN: [id number] DOB/AGE: 1918-01-25 81 y.o.  Admit date: 03/19/2015 Discharge date: 03/20/2015  Admission Diagnoses: BPH with urinary obstruction  Discharge Diagnoses:  Active Problems:   Benign prostatic hyperplasia with urinary obstruction   Discharged Condition: good  Hospital Course: The patient underwent TURP on 03/19/2015. They tolerated the procedure well and recovered on the floor without complication. Their diet was advanced to regular. They were ambulatory. Their pain was controlled with PO pain meds. They were doing well with their foley, which was clear off CBI on POD#1. By POD#1 they had met all criteria for discharge.   Discharge Exam: Blood pressure 148/73, pulse 83, temperature 98.3 F (36.8 C), temperature source Oral, resp. rate 16, height _0  (1.549 m), weight 152 lb (68.947 kg), SpO2 94 %. normal WOB. Abdomen soft, NT/ND. Foley clear pink. Extremities WWP, no edema   Disposition: 81-Discharged to home/self-care with a planned acute care hospital inpt readmission  Discharge Instructions    Nursing communication    Complete by:  As directed   Foley catheter care and provide leg bag            Medication List    STOP taking these medications        clopidogrel 75 MG tablet  Commonly known as:  PLAVIX      TAKE these medications        ciprofloxacin 250 MG tablet  Commonly known as:  CIPRO  Take 1 tablet (250 mg total) by mouth 2 (two) times daily.     docusate sodium 100 MG capsule  Commonly known as:  COLACE  Take 1 capsule (100 mg total) by mouth 2 (two) times daily.     fluticasone 50 MCG/ACT nasal spray  Commonly known as:  FLONASE  Place 2 sprays into both nostrils daily.     folic acid 1 MG tablet  Commonly known as:  FOLVITE  Take 1 mg by mouth daily.     levothyroxine 50 MCG tablet  Commonly known as:  SYNTHROID, LEVOTHROID  TAKE 1 TABLET (50 MCG TOTAL) BY MOUTH DAILY.     methotrexate 2.5 MG tablet  Commonly known as:  RHEUMATREX  Take by mouth once a week. Take 7.5 mg once a week     metoprolol succinate 25 MG 24 hr tablet  Commonly known as:  TOPROL-XL  Take 1 tablet (25 mg total) by mouth at bedtime.     nitroGLYCERIN 0.4 MG SL tablet  Commonly known as:  NITROSTAT  Place 1 tablet (0.4 mg total) under the tongue every 5 (five) minutes as needed.     oxyCODONE-acetaminophen 5-325 MG per tablet  Commonly known as:  ROXICET  Take 1 tablet by mouth every 4 (four) hours as needed for severe pain.     pantoprazole 40 MG tablet  Commonly known as:  PROTONIX  Take 1 tablet (40 mg total) by mouth daily.      ASK your doctor about these medications        meloxicam 7.5 MG tablet  Commonly known as:  MOBIC  Take 7.5 mg by mouth daily.     multivitamin tablet  Take 1 tablet by mouth daily.       Follow up at Dr. Arlyn Leak office on Monday for foley removal as scheduled.  SignedWynetta Emery, Marinell Igarashi C 03/20/2015, 6:49 AM

## 2015-03-20 NOTE — Progress Notes (Signed)
UR completed 

## 2015-03-20 NOTE — Discharge Instructions (Signed)
Foley Catheter Care °A Foley catheter is a soft, flexible tube that is placed into the bladder to drain urine. A Foley catheter may be inserted if: °· You leak urine or are not able to control when you urinate (urinary incontinence). °· You are not able to urinate when you need to (urinary retention). °· You had prostate surgery or surgery on the genitals. °· You have certain medical conditions, such as multiple sclerosis, dementia, or a spinal cord injury. °If you are going home with a Foley catheter in place, follow the instructions below. °TAKING CARE OF THE CATHETER °1. Wash your hands with soap and water. °2. Using mild soap and warm water on a clean washcloth: °· Clean the area on your body closest to the catheter insertion site using a circular motion, moving away from the catheter. Never wipe toward the catheter because this could sweep bacteria up into the urethra and cause infection. °· Remove all traces of soap. Pat the area dry with a clean towel. For males, reposition the foreskin. °3. Attach the catheter to your leg so there is no tension on the catheter. Use adhesive tape or a leg strap. If you are using adhesive tape, remove any sticky residue left behind by the previous tape you used. °4. Keep the drainage bag below the level of the bladder, but keep it off the floor. °5. Check throughout the day to be sure the catheter is working and urine is draining freely. Make sure the tubing does not become kinked. °6. Do not pull on the catheter or try to remove it. Pulling could damage internal tissues. °TAKING CARE OF THE DRAINAGE BAGS °You will be given two drainage bags to take home. One is a large overnight drainage bag, and the other is a smaller leg bag that fits underneath clothing. You may wear the overnight bag at any time, but you should never wear the smaller leg bag at night. Follow the instructions below for how to empty, change, and clean your drainage bags. °Emptying the Drainage Bag °You must  empty your drainage bag when it is  -½ full or at least 2-3 times a day. °1. Wash your hands with soap and water. °2. Keep the drainage bag below your hips, below the level of your bladder. This stops urine from going back into the tubing and into your bladder. °3. Hold the dirty bag over the toilet or a clean container. °4. Open the pour spout at the bottom of the bag and empty the urine into the toilet or container. Do not let the pour spout touch the toilet, container, or any other surface. Doing so can place bacteria on the bag, which can cause an infection. °5. Clean the pour spout with a gauze pad or cotton ball that has rubbing alcohol on it. °6. Close the pour spout. °7. Attach the bag to your leg with adhesive tape or a leg strap. °8. Wash your hands well. °Changing the Drainage Bag °Change your drainage bag once a month or sooner if it starts to smell bad or look dirty. Below are steps to follow when changing the drainage bag. °1. Wash your hands with soap and water. °2. Pinch off the rubber catheter so that urine does not spill out. °3. Disconnect the catheter tube from the drainage tube at the connection valve. Do not let the tubes touch any surface. °4. Clean the end of the catheter tube with an alcohol wipe. Use a different alcohol wipe to clean the   end of the drainage tube. °5. Connect the catheter tube to the drainage tube of the clean drainage bag. °6. Attach the new bag to the leg with adhesive tape or a leg strap. Avoid attaching the new bag too tightly. °7. Wash your hands well. °Cleaning the Drainage Bag °1. Wash your hands with soap and water. °2. Wash the bag in warm, soapy water. °3. Rinse the bag thoroughly with warm water. °4. Fill the bag with a solution of white vinegar and water (1 cup vinegar to 1 qt warm water [.2 L vinegar to 1 L warm water]). Close the bag and soak it for 30 minutes in the solution. °5. Rinse the bag with warm water. °6. Hang the bag to dry with the pour spout open  and hanging downward. °7. Store the clean bag (once it is dry) in a clean plastic bag. °8. Wash your hands well. °PREVENTING INFECTION °· Wash your hands before and after handling your catheter. °· Take showers daily and wash the area where the catheter enters your body. Do not take baths. Replace wet leg straps with dry ones, if this applies. °· Do not use powders, sprays, or lotions on the genital area. Only use creams, lotions, or ointments as directed by your caregiver. °· For females, wipe from front to back after each bowel movement. °· Drink enough fluids to keep your urine clear or pale yellow unless you have a fluid restriction. °· Do not let the drainage bag or tubing touch or lie on the floor. °· Wear cotton underwear to absorb moisture and to keep your skin drier. °SEEK MEDICAL CARE IF:  °· Your urine is cloudy or smells unusually bad. °· Your catheter becomes clogged. °· You are not draining urine into the bag or your bladder feels full. °· Your catheter starts to leak. °SEEK IMMEDIATE MEDICAL CARE IF:  °· You have pain, swelling, redness, or pus where the catheter enters the body. °· You have pain in the abdomen, legs, lower back, or bladder. °· You have a fever. °· You see blood fill the catheter, or your urine is pink or red. °· You have nausea, vomiting, or chills. °· Your catheter gets pulled out. °MAKE SURE YOU:  °· Understand these instructions. °· Will watch your condition. °· Will get help right away if you are not doing well or get worse. °Document Released: 11/07/2005 Document Revised: 03/24/2014 Document Reviewed: 10/29/2012 °Exit[employer]® Patient Information ©2015 [employer]. This information is not intended to replace advice given to you by your health care provider. Make sure you discuss any questions you have with your health care provider. ° °

## 2015-03-20 NOTE — Progress Notes (Signed)
Patient's CBI has been disconnected. CBI clamped prior to shift change by MD with an order to discharge home with foley and to follow up on Monday, May 2nd. Leg bag and foley care teaching provided with teach back.

## 2015-03-21 ENCOUNTER — Encounter (HOSPITAL_COMMUNITY): Payer: Self-pay

## 2015-03-21 ENCOUNTER — Emergency Department (HOSPITAL_COMMUNITY)
Admission: EM | Admit: 2015-03-21 | Discharge: 2015-03-21 | Disposition: A | Discharge status: Home / Self Care | Payer: Medicare Other | Attending: Emergency Medicine | Admitting: Emergency Medicine

## 2015-03-21 DIAGNOSIS — R339 Retention of urine, unspecified: Secondary | ICD-10-CM | POA: Diagnosis present

## 2015-03-21 DIAGNOSIS — Z792 Long term (current) use of antibiotics: Secondary | ICD-10-CM | POA: Diagnosis not present

## 2015-03-21 DIAGNOSIS — N189 Chronic kidney disease, unspecified: Secondary | ICD-10-CM | POA: Diagnosis not present

## 2015-03-21 DIAGNOSIS — Z79899 Other long term (current) drug therapy: Secondary | ICD-10-CM | POA: Diagnosis not present

## 2015-03-21 DIAGNOSIS — Z87891 Personal history of nicotine dependence: Secondary | ICD-10-CM | POA: Diagnosis not present

## 2015-03-21 DIAGNOSIS — N3289 Other specified disorders of bladder: Secondary | ICD-10-CM | POA: Insufficient documentation

## 2015-03-21 DIAGNOSIS — M1990 Unspecified osteoarthritis, unspecified site: Secondary | ICD-10-CM | POA: Insufficient documentation

## 2015-03-21 DIAGNOSIS — M069 Rheumatoid arthritis, unspecified: Secondary | ICD-10-CM | POA: Diagnosis not present

## 2015-03-21 DIAGNOSIS — Z791 Long term (current) use of non-steroidal anti-inflammatories (NSAID): Secondary | ICD-10-CM | POA: Insufficient documentation

## 2015-03-21 DIAGNOSIS — Z8673 Personal history of transient ischemic attack (TIA), and cerebral infarction without residual deficits: Secondary | ICD-10-CM | POA: Insufficient documentation

## 2015-03-21 DIAGNOSIS — Z7902 Long term (current) use of antithrombotics/antiplatelets: Secondary | ICD-10-CM | POA: Insufficient documentation

## 2015-03-21 DIAGNOSIS — E039 Hypothyroidism, unspecified: Secondary | ICD-10-CM | POA: Diagnosis not present

## 2015-03-21 DIAGNOSIS — K219 Gastro-esophageal reflux disease without esophagitis: Secondary | ICD-10-CM | POA: Diagnosis not present

## 2015-03-21 DIAGNOSIS — Z7951 Long term (current) use of inhaled steroids: Secondary | ICD-10-CM | POA: Diagnosis not present

## 2015-03-21 NOTE — ED Notes (Signed)
Pt presents with c/o urinary retention and bladder spasms. Pt had surgery on Thursday at this facility and had a catheter placed. Pt reports that now the catheter is not draining, it is leaking around the insertion site, and he is experiencing bladder spasms.

## 2015-03-21 NOTE — Consult Note (Signed)
Reason for Consult: Bladder Spasms / Catheter Problems after Transurethral Resection of Prostate  Referring Physician: Jola Schmidt MD  Robert Rowe is an 81 y.o. male.   HPI:   1 - Prostatic Hypertrophy - s/p TURP 03/19/15 for med-refractory prostatic hypertrophy. Path benign.   2 - Bladder Spasms / Catheter Problems - pt with leakage of urine around catheter c/w bladder spasms but concerned about catheter malfucntion. Has call on-call MD (me) 3x today wtihin 4 hour period therefore couseled to have MD eval in ER.  Bladder with "0" residual by bedside US and irrigates quantitatively w/o clots.  Today Robert Rowe is seen in consultation for above, specifically to rule out any concerning catheter problems after TURP.   Past Medical History  Diagnosis Date  . BENIGN PROSTATIC HYPERTROPHY 04/05/2007  . CHEST DISCOMFORT 04/25/2009  . GERD 12/03/2007  . HYPERLIPIDEMIA 04/05/2007  . Osteoarth NOS-Unspec 04/05/2007  . Palpitations 02/12/2010  . PEDAL EDEMA 05/01/2008  . Rheumatoid arthritis(714.0) 04/05/2007  . TRANSIENT ISCHEMIC ATTACK, HX OF 04/05/2007    ?   Robert Rowe Kitchen DJD (degenerative joint disease)   . Chronic kidney disease     chronic   . Hiatal hernia   . Dysrhythmia   . Hypothyroidism     Past Surgical History  Procedure Laterality Date  . Hemorrhoid surgery    . Hernia repair      inguinal x5  . Cataract extraction      bilateral  . Knee surgery      left  . Shoulder surgery      left  . Prostate cryoablation      2000  . Esophagogastroduodenoscopy N/A 03/12/2013    Procedure: ESOPHAGOGASTRODUODENOSCOPY (EGD);  Surgeon: Inda Castle, MD;  Location: [facility];  Service: Endoscopy;  Laterality: N/A;  . Balloon dilation N/A 03/12/2013    Procedure: BALLOON DILATION;  Surgeon: Inda Castle, MD;  Location: [facility];  Service: Endoscopy;  Laterality: N/A;  . Transurethral resection of prostate N/A 03/19/2015    Procedure: TRANSURETHRAL RESECTION OF THE PROSTATE (TURP);  Surgeon:  Carolan Clines, MD;  Location: [facility];  Service: Urology;  Laterality: N/A;    Family History  Problem Relation Age of Onset  . Breast cancer Mother   . Lymphoma Brother   . Heart disease Father     Social History:  reports that he quit smoking about 61 years ago. He has never used smokeless tobacco. He reports that he does not drink alcohol or use illicit drugs.  Allergies:  Allergies  Allergen Reactions  . Sulfonamide Derivatives     Patient does not remember allergy symptoms    Medications: I have reviewed the patient's current medications.  No results found for this or any previous visit (from the past 48 hour(s)).  No results found.  Review of Systems  Constitutional: Negative.  Negative for fever, chills and malaise/fatigue.  HENT: Negative.   Eyes: Negative.   Respiratory: Negative.   Cardiovascular: Negative.   Gastrointestinal: Negative.  Negative for nausea and vomiting.  Genitourinary: Positive for urgency and frequency. Negative for hematuria and flank pain.  Musculoskeletal: Negative.   Skin: Negative.   Neurological: Negative.   Endo/Heme/Allergies: Negative.   Psychiatric/Behavioral: Negative.    Blood pressure 194/104, pulse 100, temperature 97.5 F (36.4 C), temperature source Oral, resp. rate 18, SpO2 96 %. Physical Exam  Constitutional: He is oriented to person, place, and time. He appears well-developed and well-nourished.  family at bedside  HENT:  Head: Normocephalic.  Eyes: Pupils are equal, round, and reactive to light.  Neck: Normal range of motion.  Cardiovascular: Normal rate.   Respiratory:  "pink puffer" breathing with pursed lips. Non-labored.   GI: Soft.  Genitourinary:  Foley c/d/i with dark yellow urine. Irrigates quantitatively. balloon taken down to 15cc from 30cc.   Musculoskeletal: Normal range of motion.  Neurological: He is alert and oriented to person, place, and time.  Skin: Skin is warm and dry.  Psychiatric: He  has a normal mood and affect. His behavior is normal. Judgment and thought content normal.    Assessment/Plan:  1 - Prostatic Hypertrophy - proceed as planned with office trial of void on Monday.   2 - Bladder Spasms / Catheter Problems - pt not in retention. Picture most c/w bladder spasms. Given his age, am VERY reluctant to use anticholinergics. Pt given samples of Myrbetriq 25mg  QD x 2 weeks. Warned some level of spasms may persist but will hopefully improve with foley out. Family and pt voiced understanding.     Robert Rowe 03/21/2015, 4:51 PM

## 2015-03-21 NOTE — ED Provider Notes (Signed)
CSN: [id number]     Arrival date & time 03/21/15  1605 History   First MD Initiated Contact with Patient 03/21/15 1617     Chief Complaint  Patient presents with  . Urinary Retention     (Consider location/radiation/quality/duration/timing/severity/associated sxs/prior Treatment) HPI Patient is status post TURP and currently postop day #3.  Presents with lower abdominal spasms and leaking around his three-way 17 French Foley catheter.  Denies fevers or chills.  No nausea or vomiting.  His pain is moderate to severe in severity when the spasms occur.  Called his urologist and was recommended that he come to the ER for evaluation.  Reports mild hematuria and reports decreased catheter output over the past several hours.  No fevers.   Past Medical History  Diagnosis Date  . BENIGN PROSTATIC HYPERTROPHY 04/05/2007  . CHEST DISCOMFORT 04/25/2009  . GERD 12/03/2007  . HYPERLIPIDEMIA 04/05/2007  . Osteoarth NOS-Unspec 04/05/2007  . Palpitations 02/12/2010  . PEDAL EDEMA 05/01/2008  . Rheumatoid arthritis(714.0) 04/05/2007  . TRANSIENT ISCHEMIC ATTACK, HX OF 04/05/2007    ?   Marland Kitchen DJD (degenerative joint disease)   . Chronic kidney disease     chronic   . Hiatal hernia   . Dysrhythmia   . Hypothyroidism    Past Surgical History  Procedure Laterality Date  . Hemorrhoid surgery    . Hernia repair      inguinal x5  . Cataract extraction      bilateral  . Knee surgery      left  . Shoulder surgery      left  . Prostate cryoablation      2000  . Esophagogastroduodenoscopy N/A 03/12/2013    Procedure: ESOPHAGOGASTRODUODENOSCOPY (EGD);  Surgeon: Inda Castle, MD;  Location: [facility];  Service: Endoscopy;  Laterality: N/A;  . Balloon dilation N/A 03/12/2013    Procedure: BALLOON DILATION;  Surgeon: Inda Castle, MD;  Location: [facility];  Service: Endoscopy;  Laterality: N/A;  . Transurethral resection of prostate N/A 03/19/2015    Procedure: TRANSURETHRAL RESECTION OF THE PROSTATE  (TURP);  Surgeon: Carolan Clines, MD;  Location: [facility];  Service: Urology;  Laterality: N/A;   Family History  Problem Relation Age of Onset  . Breast cancer Mother   . Lymphoma Brother   . Heart disease Father    History  Substance Use Topics  . Smoking status: Former Smoker    Quit date: 08/13/1953  . Smokeless tobacco: Never Used  . Alcohol Use: No     Comment: patient has given up alcohol    Review of Systems  All other systems reviewed and are negative.     Allergies  Sulfonamide derivatives  Home Medications   Prior to Admission medications   Medication Sig Start Date End Date Taking? Authorizing Provider  ciprofloxacin (CIPRO) 250 MG tablet Take 1 tablet (250 mg total) by mouth 2 (two) times daily. 03/19/15  Yes Amaryllis Dyke, MD  clopidogrel (PLAVIX) 75 MG tablet Take 75 mg by mouth daily.  03/20/15  Yes Historical Provider, MD  docusate sodium (COLACE) 100 MG capsule Take 1 capsule (100 mg total) by mouth 2 (two) times daily. 03/19/15  Yes Amaryllis Dyke, MD  fluticasone [facility]) 50 MCG/ACT nasal spray Place 2 sprays into both nostrils daily. 03/19/14  Yes Marletta Lor, MD  folic acid (FOLVITE) 1 MG tablet Take 1 mg by mouth daily.     Yes Historical Provider, MD  levothyroxine (SYNTHROID, LEVOTHROID) 50 MCG tablet TAKE 1  TABLET (50 MCG TOTAL) BY MOUTH DAILY. 12/17/14  Yes Marletta Lor, MD  meloxicam (MOBIC) 7.5 MG tablet Take 7.5 mg by mouth daily.   Yes Historical Provider, MD  metoprolol succinate (TOPROL-XL) 25 MG 24 hr tablet Take 1 tablet (25 mg total) by mouth at bedtime. 03/12/15  Yes Darlin Coco, MD  Multiple Vitamin (MULTIVITAMIN) tablet Take 1 tablet by mouth daily.     Yes Historical Provider, MD  oxyCODONE-acetaminophen (ROXICET) 5-325 MG per tablet Take 1 tablet by mouth every 4 (four) hours as needed for severe pain. 03/19/15  Yes Amaryllis Dyke, MD  pantoprazole (PROTONIX) 40 MG tablet Take 1 tablet (40 mg total) by mouth daily. 08/14/14   Yes Marletta Lor, MD  methotrexate (RHEUMATREX) 2.5 MG tablet Take by mouth once a week. Take 7.5 mg once a week    Historical Provider, MD  nitroGLYCERIN (NITROSTAT) 0.4 MG SL tablet Place 1 tablet (0.4 mg total) under the tongue every 5 (five) minutes as needed. Patient taking differently: Place 0.4 mg under the tongue every 5 (five) minutes as needed. For esophageal spasms if needed 03/10/15   Marletta Lor, MD   BP 194/104 mmHg  Pulse 100  Temp(Src) 97.5 F (36.4 C) (Oral)  Resp 18  SpO2 96% Physical Exam  Constitutional: He is oriented to person, place, and time. He appears well-developed and well-nourished.  HENT:  Head: Normocephalic and atraumatic.  Eyes: EOM are normal.  Neck: Normal range of motion.  Cardiovascular: Normal rate and regular rhythm.   Pulmonary/Chest: Effort normal and breath sounds normal. No respiratory distress.  Abdominal: Soft. He exhibits no distension. There is no tenderness.  Genitourinary:  Three-way Foley catheter in place.  Able to flush the 2 ports without any difficulty.  No significant return of fluid.  No obvious blood clots encountered.  Ultrasound of his bladder demonstrates Foley catheter and balloon within the bladder itself without distended bladder.  Musculoskeletal: Normal range of motion.  Neurological: He is alert and oriented to person, place, and time.  Skin: Skin is warm and dry.  Psychiatric: He has a normal mood and affect. Judgment normal.  Nursing note and vitals reviewed.   ED Course  Procedures (including critical care time) Labs Review Labs Reviewed - No data to display  Imaging Review No results found.   EKG Interpretation None      MDM   Final diagnoses:  Bladder spasm    Spasms without complication of three-way Foley.  I spoke with urology who evaluated the patient the bedside.  Dr. Tresa Moore gave the patient samples of antispasm medicine from his office.  He suspects this is mainly bladder spasm.   Discharge home in good condition.  Patient is scheduled to see urology in 2 days.  He understands to return to the ER for new or worsening symptoms.    Jola Schmidt, MD 03/21/15 [phone]

## 2015-03-30 DIAGNOSIS — N3281 Overactive bladder: Secondary | ICD-10-CM | POA: Diagnosis not present

## 2015-03-30 DIAGNOSIS — N3942 Incontinence without sensory awareness: Secondary | ICD-10-CM | POA: Diagnosis not present

## 2015-04-02 ENCOUNTER — Ambulatory Visit: Payer: Self-pay | Admitting: Neurology

## 2015-04-03 ENCOUNTER — Ambulatory Visit: Payer: Medicare Other | Admitting: Neurology

## 2015-04-13 ENCOUNTER — Telehealth: Payer: Self-pay

## 2015-04-13 MED ORDER — PANTOPRAZOLE SODIUM 40 MG PO TBEC: 40.0000 mg | DELAYED_RELEASE_TABLET | Freq: Every day | ORAL | Status: DC

## 2015-04-13 NOTE — Telephone Encounter (Signed)
[place], [place] [address]: pantoprazole (PROTONIX) 40 MG tablet

## 2015-04-13 NOTE — Telephone Encounter (Signed)
Rx sent 

## 2015-04-14 DIAGNOSIS — N3942 Incontinence without sensory awareness: Secondary | ICD-10-CM | POA: Diagnosis not present

## 2015-04-14 DIAGNOSIS — N3281 Overactive bladder: Secondary | ICD-10-CM | POA: Diagnosis not present

## 2015-04-14 DIAGNOSIS — R278 Other lack of coordination: Secondary | ICD-10-CM | POA: Diagnosis not present

## 2015-04-14 DIAGNOSIS — M6281 Muscle weakness (generalized): Secondary | ICD-10-CM | POA: Diagnosis not present

## 2015-04-17 ENCOUNTER — Telehealth: Payer: Self-pay | Admitting: Neurology

## 2015-04-17 NOTE — Telephone Encounter (Signed)
Pt called and wanted to speak with Dr Delice Lesch in regards to his EEG, he said he feels he doesn't need the appointment but please call him back at [phone]/Dawn

## 2015-04-17 NOTE — Telephone Encounter (Signed)
Spoke with patient and he has already cancelled AMB EEG. I tried to explain why it was needed, but he wants to speak with Dr Delice Lesch directly. I tried to schedule follow up appt but he would like a call. He has had no further episodes of altered awareness. I advised I would pass along the message but she would not be back til next week.

## 2015-04-21 ENCOUNTER — Other Ambulatory Visit: Payer: Medicare Other

## 2015-05-01 ENCOUNTER — Telehealth: Payer: Self-pay | Admitting: Neurology

## 2015-05-01 NOTE — Telephone Encounter (Signed)
Pt called asking to speak to Dr Delice Lesch, asked what it was in regards too and he said myself as a patient, would like a call back [phone]/Dawn

## 2015-05-01 NOTE — Telephone Encounter (Signed)
He states that he doesn't feel the need to have a f/u appt. He states he's not having any problems, he's doing ok. He wanted me to tell you that he appreciated you seeing him. He asked that I cancel his f/u appt.

## 2015-05-12 ENCOUNTER — Ambulatory Visit: Payer: Medicare Other | Admitting: Neurology

## 2015-05-13 ENCOUNTER — Ambulatory Visit: Payer: Medicare Other | Admitting: Neurology

## 2015-05-21 ENCOUNTER — Other Ambulatory Visit: Payer: Self-pay

## 2015-05-21 MED ORDER — FLUTICASONE PROPIONATE 50 MCG/ACT NA SUSP: 2.0000 | Freq: Every day | NASAL | Status: DC

## 2015-05-29 ENCOUNTER — Telehealth: Payer: Self-pay | Admitting: Internal Medicine

## 2015-05-29 ENCOUNTER — Other Ambulatory Visit: Payer: Self-pay | Admitting: Internal Medicine

## 2015-05-29 MED ORDER — NEOMYCIN-POLYMYXIN-HC 3.5-10000-1 OP SUSP: 2.0000 [drp] | Freq: Four times a day (QID) | OPHTHALMIC | Status: DC

## 2015-05-29 NOTE — Telephone Encounter (Signed)
Pt would like dr Raliegh Ip to call in eye drops cortisone and abx combination [facility]

## 2015-05-29 NOTE — Telephone Encounter (Signed)
Please see message and advise 

## 2015-06-01 DIAGNOSIS — N390 Urinary tract infection, site not specified: Secondary | ICD-10-CM | POA: Diagnosis not present

## 2015-06-01 DIAGNOSIS — N138 Other obstructive and reflux uropathy: Secondary | ICD-10-CM | POA: Diagnosis not present

## 2015-06-01 DIAGNOSIS — N401 Enlarged prostate with lower urinary tract symptoms: Secondary | ICD-10-CM | POA: Diagnosis not present

## 2015-06-11 ENCOUNTER — Telehealth: Payer: Self-pay | Admitting: *Deleted

## 2015-06-11 NOTE — Telephone Encounter (Signed)
Dr. Raliegh Ip, please call pt. Thanks

## 2015-06-11 NOTE — Telephone Encounter (Signed)
Open in error

## 2015-06-11 NOTE — Telephone Encounter (Signed)
Patient is out of town and would like to speak with Butch Penny or Dr Raliegh Ip about surgery complications.  No other information given.

## 2015-06-19 DIAGNOSIS — N401 Enlarged prostate with lower urinary tract symptoms: Secondary | ICD-10-CM | POA: Diagnosis not present

## 2015-06-19 DIAGNOSIS — N359 Urethral stricture, unspecified: Secondary | ICD-10-CM | POA: Diagnosis not present

## 2015-06-22 ENCOUNTER — Ambulatory Visit (INDEPENDENT_AMBULATORY_CARE_PROVIDER_SITE_OTHER): Payer: Medicare Other | Admitting: Internal Medicine

## 2015-06-22 ENCOUNTER — Encounter: Payer: Self-pay | Admitting: Internal Medicine

## 2015-06-22 VITALS — BP 140/90 | HR 94 | Temp 97.4°F | Resp 20 | Ht 61.5 in | Wt 148.0 lb

## 2015-06-22 DIAGNOSIS — E785 Hyperlipidemia, unspecified: Secondary | ICD-10-CM

## 2015-06-22 DIAGNOSIS — M069 Rheumatoid arthritis, unspecified: Secondary | ICD-10-CM | POA: Diagnosis not present

## 2015-06-22 DIAGNOSIS — Z0000 Encounter for general adult medical examination without abnormal findings: Secondary | ICD-10-CM

## 2015-06-22 DIAGNOSIS — Z8679 Personal history of other diseases of the circulatory system: Secondary | ICD-10-CM | POA: Diagnosis not present

## 2015-06-22 DIAGNOSIS — E02 Subclinical iodine-deficiency hypothyroidism: Secondary | ICD-10-CM

## 2015-06-22 DIAGNOSIS — Z23 Encounter for immunization: Secondary | ICD-10-CM

## 2015-06-22 DIAGNOSIS — E039 Hypothyroidism, unspecified: Secondary | ICD-10-CM

## 2015-06-22 LAB — CBC WITH DIFFERENTIAL/PLATELET
Basophils Absolute: 0 10*3/uL (ref 0.0–0.1)
Basophils Relative: 0.7 % (ref 0.0–3.0)
Eosinophils Absolute: 0.2 10*3/uL (ref 0.0–0.7)
Eosinophils Relative: 2.6 % (ref 0.0–5.0)
HCT: 44.8 % (ref 39.0–52.0)
Hemoglobin: 15 g/dL (ref 13.0–17.0)
Lymphocytes Relative: 15.7 % (ref 12.0–46.0)
Lymphs Abs: 1.1 10*3/uL (ref 0.7–4.0)
MCHC: 33.5 g/dL (ref 30.0–36.0)
MCV: 97.5 fl (ref 78.0–100.0)
Monocytes Absolute: 0.6 10*3/uL (ref 0.1–1.0)
Monocytes Relative: 8.8 % (ref 3.0–12.0)
Neutro Abs: 4.9 10*3/uL (ref 1.4–7.7)
Neutrophils Relative %: 72.2 % (ref 43.0–77.0)
Platelets: 241 10*3/uL (ref 150.0–400.0)
RBC: 4.59 Mil/uL (ref 4.22–5.81)
RDW: 15 % (ref 11.5–15.5)
WBC: 6.8 10*3/uL (ref 4.0–10.5)

## 2015-06-22 LAB — COMPREHENSIVE METABOLIC PANEL
ALT: 12 U/L (ref 0–53)
AST: 16 U/L (ref 0–37)
Albumin: 3.8 g/dL (ref 3.5–5.2)
Alkaline Phosphatase: 62 U/L (ref 39–117)
BUN: 18 mg/dL (ref 6–23)
CO2: 28 mEq/L (ref 19–32)
Calcium: 9.6 mg/dL (ref 8.4–10.5)
Chloride: 103 mEq/L (ref 96–112)
Creatinine, Ser: 1.46 mg/dL (ref 0.40–1.50)
GFR: 47.71 mL/min — ABNORMAL LOW (ref >60.00)
Glucose, Bld: 113 mg/dL — ABNORMAL HIGH (ref 70–99)
Potassium: 4.7 mEq/L (ref 3.5–5.1)
Sodium: 139 mEq/L (ref 135–145)
Total Bilirubin: 0.6 mg/dL (ref 0.2–1.2)
Total Protein: 6.5 g/dL (ref 6.0–8.3)

## 2015-06-22 LAB — TSH: TSH: 2.41 u[IU]/mL (ref 0.35–4.50)

## 2015-06-22 LAB — LIPID PANEL
Cholesterol: 221 mg/dL — ABNORMAL HIGH (ref 0–200)
HDL: 41.5 mg/dL (ref >39.00)
LDL Cholesterol: 152 mg/dL — ABNORMAL HIGH (ref 0–99)
NonHDL: 179.17
Total CHOL/HDL Ratio: 5
Triglycerides: 134 mg/dL (ref 0.0–149.0)
VLDL: 26.8 mg/dL (ref 0.0–40.0)

## 2015-06-22 NOTE — Progress Notes (Signed)
Pre visit review using our clinic review tool, if applicable. No additional management support is needed unless otherwise documented below in the visit note. 

## 2015-06-22 NOTE — Patient Instructions (Addendum)
Limit your sodium (Salt) intake  Rheumatology follow-up Health Maintenance A healthy lifestyle and preventative care can promote health and wellness.  Maintain regular health, dental, and eye exams.  Eat a healthy diet. Foods like vegetables, fruits, whole grains, low-fat dairy products, and lean protein foods contain the nutrients you need and are low in calories. Decrease your intake of foods high in solid fats, added sugars, and salt. Get information about a proper diet from your health care provider, if necessary.  Regular physical exercise is one of the most important things you can do for your health. Most adults should get at least 150 minutes of moderate-intensity exercise (any activity that increases your heart rate and causes you to sweat) each week. In addition, most adults need muscle-strengthening exercises on 2 or more days a week.   Maintain a healthy weight. The body mass index (BMI) is a screening tool to identify possible weight problems. It provides an estimate of body fat based on height and weight. Your health care provider can find your BMI and can help you achieve or maintain a healthy weight. For males 20 years and older:  A BMI below 18.5 is considered underweight.  A BMI of 18.5 to 24.9 is normal.  A BMI of 25 to 29.9 is considered overweight.  A BMI of 30 and above is considered obese.  Maintain normal blood lipids and cholesterol by exercising and minimizing your intake of saturated fat. Eat a balanced diet with plenty of fruits and vegetables. Blood tests for lipids and cholesterol should begin at age 69 and be repeated every 5 years. If your lipid or cholesterol levels are high, you are over age 47, or you are at high risk for heart disease, you may need your cholesterol levels checked more frequently.Ongoing high lipid and cholesterol levels should be treated with medicines if diet and exercise are not working.  If you smoke, find out from your health care  provider how to quit. If you do not use tobacco, do not start.  Lung cancer screening is recommended for adults aged 2-80 years who are at high risk for developing lung cancer because of a history of smoking. A yearly low-dose CT scan of the lungs is recommended for people who have at least a 30-pack-year history of smoking and are current smokers or have quit within the past 15 years. A pack year of smoking is smoking an average of 1 pack of cigarettes a day for 1 year (for example, a 30-pack-year history of smoking could mean smoking 1 pack a day for 30 years or 2 packs a day for 15 years). Yearly screening should continue until the smoker has stopped smoking for at least 15 years. Yearly screening should be stopped for people who develop a health problem that would prevent them from having lung cancer treatment.  If you choose to drink alcohol, do not have more than 2 drinks per day. One drink is considered to be 12 oz (360 mL) of beer, 5 oz (150 mL) of wine, or 1.5 oz (45 mL) of liquor.  Avoid the use of street drugs. Do not share needles with anyone. Ask for help if you need support or instructions about stopping the use of drugs.  High blood pressure causes heart disease and increases the risk of stroke. Blood pressure should be checked at least every 1-2 years. Ongoing high blood pressure should be treated with medicines if weight loss and exercise are not effective.  If you are 45-79  years old, ask your health care provider if you should take aspirin to prevent heart disease.  Diabetes screening involves taking a blood sample to check your fasting blood sugar level. This should be done once every 3 years after age 59 if you are at a normal weight and without risk factors for diabetes. Testing should be considered at a younger age or be carried out more frequently if you are overweight and have at least 1 risk factor for diabetes.  Colorectal cancer can be detected and often prevented. Most  routine colorectal cancer screening begins at the age of 72 and continues through age 45. However, your health care provider may recommend screening at an earlier age if you have risk factors for colon cancer. On a yearly basis, your health care provider may provide home test kits to check for hidden blood in the stool. A small camera at the end of a tube may be used to directly examine the colon (sigmoidoscopy or colonoscopy) to detect the earliest forms of colorectal cancer. Talk to your health care provider about this at age 52 when routine screening begins. A direct exam of the colon should be repeated every 5-10 years through age 75, unless early forms of precancerous polyps or small growths are found.  People who are at an increased risk for hepatitis B should be screened for this virus. You are considered at high risk for hepatitis B if:  You were born in a country where hepatitis B occurs often. Talk with your health care provider about which countries are considered high risk.  Your parents were born in a high-risk country and you have not received a shot to protect against hepatitis B (hepatitis B vaccine).  You have HIV or AIDS.  You use needles to inject street drugs.  You live with, or have sex with, someone who has hepatitis B.  You are a man who has sex with other men (MSM).  You get hemodialysis treatment.  You take certain medicines for conditions like cancer, organ transplantation, and autoimmune conditions.  Hepatitis C blood testing is recommended for all people born from [birth year] through 1963 and any individual with known risk factors for hepatitis C.  Healthy men should no longer receive prostate-specific antigen (PSA) blood tests as part of routine cancer screening. Talk to your health care provider about prostate cancer screening.  Testicular cancer screening is not recommended for adolescents or adult males who have no symptoms. Screening includes self-exam, a health care  provider exam, and other screening tests. Consult with your health care provider about any symptoms you have or any concerns you have about testicular cancer.  Practice safe sex. Use condoms and avoid high-risk sexual practices to reduce the spread of sexually transmitted infections (STIs).  You should be screened for STIs, including gonorrhea and chlamydia if:  You are sexually active and are younger than 24 years.  You are older than 24 years, and your health care provider tells you that you are at risk for this type of infection.  Your sexual activity has changed since you were last screened, and you are at an increased risk for chlamydia or gonorrhea. Ask your health care provider if you are at risk.  If you are at risk of being infected with HIV, it is recommended that you take a prescription medicine daily to prevent HIV infection. This is called pre-exposure prophylaxis (PrEP). You are considered at risk if:  You are a man who has sex with other men (  MSM).  You are a heterosexual man who is sexually active with multiple partners.  You take drugs by injection.  You are sexually active with a partner who has HIV.  Talk with your health care provider about whether you are at high risk of being infected with HIV. If you choose to begin PrEP, you should first be tested for HIV. You should then be tested every 3 months for as long as you are taking PrEP.  Use sunscreen. Apply sunscreen liberally and repeatedly throughout the day. You should seek shade when your shadow is shorter than you. Protect yourself by wearing long sleeves, pants, a wide-brimmed hat, and sunglasses year round whenever you are outdoors.  Tell your health care provider of new moles or changes in moles, especially if there is a change in shape or color. Also, tell your health care provider if a mole is larger than the size of a pencil eraser.  A one-time screening for abdominal aortic aneurysm (AAA) and surgical  repair of large AAAs by ultrasound is recommended for men aged 63-75 years who are current or former smokers.  Stay current with your vaccines (immunizations). Document Released: 05/05/2008 Document Revised: 11/12/2013 Document Reviewed: 04/04/2011 [facility] Patient Information 2015 [place], [place]. This information is not intended to replace advice given to you by your health care provider. Make sure you discuss any questions you have with your health care provider.   Please check your blood pressure on a regular basis.  If it is consistently greater than 150/90, please make an office appointment.

## 2015-06-22 NOTE — Progress Notes (Signed)
Subjective:    Patient ID: Robert Rowe, male    DOB: [birth year], 81 y.o.   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 He is followed by rheumatology due to RA He has been followed closely by urology and is status post TURP in April of this year.  More recently has had dilatation of a urethral stricture. He has also been followed by neurology due to episode of possible TIA Patient is doing well.  His only complaint is failing memory.  This has been a concern for a number of years Recent MRI reviewed that revealed cerebral atrophy and evidence of microvascular disease  Past Medical History  Diagnosis Date  . BENIGN PROSTATIC HYPERTROPHY 04/05/2007  . CHEST DISCOMFORT 04/25/2009  . GERD 12/03/2007  . HYPERLIPIDEMIA 04/05/2007  . Osteoarth NOS-Unspec 04/05/2007  . Palpitations 02/12/2010  . PEDAL EDEMA 05/01/2008  . Rheumatoid arthritis(714.0) 04/05/2007  . TRANSIENT ISCHEMIC ATTACK, HX OF 04/05/2007    ?   Marland Kitchen DJD (degenerative joint disease)   . Chronic kidney disease     chronic   . Hiatal hernia   . Dysrhythmia   . Hypothyroidism     History   Social History  . Marital Status: Married    Spouse Name: N/A  . Number of Children: N/A  . Years of Education: N/A   Occupational History  . Not on file.   Social History Main Topics  . Smoking status: Former Smoker    Quit date: 08/13/1953  . Smokeless tobacco: Never Used  . Alcohol Use: No     Comment: patient has given up alcohol  . Drug Use: No  . Sexual Activity: Not on file   Other Topics Concern  . Not on file   Social History Narrative    Past Surgical History  Procedure Laterality Date  . Hemorrhoid surgery    . Hernia repair      inguinal x5  . Cataract extraction      bilateral  . Knee surgery      left  . Shoulder surgery      left  . Prostate cryoablation      2000  . Esophagogastroduodenoscopy N/A 03/12/2013    Procedure:  ESOPHAGOGASTRODUODENOSCOPY (EGD);  Surgeon: Inda Castle, MD;  Location: [facility];  Service: Endoscopy;  Laterality: N/A;  . Balloon dilation N/A 03/12/2013    Procedure: BALLOON DILATION;  Surgeon: Inda Castle, MD;  Location: [facility];  Service: Endoscopy;  Laterality: N/A;  . Transurethral resection of prostate N/A 03/19/2015    Procedure: TRANSURETHRAL RESECTION OF THE PROSTATE (TURP);  Surgeon: Carolan Clines, MD;  Location: [facility];  Service: Urology;  Laterality: N/A;    Family History  Problem Relation Age of Onset  . Breast cancer Mother   . Lymphoma Brother   . Heart disease Father     Allergies  Allergen Reactions  . Sulfonamide Derivatives     Patient does not remember allergy symptoms    Current Outpatient Prescriptions on File Prior to Visit  Medication Sig Dispense Refill  . clopidogrel (PLAVIX) 75 MG tablet Take 75 mg by mouth daily.     Marland Kitchen docusate sodium (COLACE) 100 MG capsule Take 1 capsule (100 mg total) by mouth 2 (two) times daily. 10 capsule 0  . folic acid (FOLVITE) 1 MG tablet Take 1 mg by mouth daily.      Marland Kitchen levothyroxine (SYNTHROID, LEVOTHROID) 50 MCG tablet  TAKE 1 TABLET (50 MCG TOTAL) BY MOUTH DAILY. 90 tablet 1  . meloxicam (MOBIC) 7.5 MG tablet Take 7.5 mg by mouth daily.    . methotrexate (RHEUMATREX) 2.5 MG tablet Take by mouth once a week. Take 7.5 mg once a week    . metoprolol succinate (TOPROL-XL) 25 MG 24 hr tablet Take 1 tablet (25 mg total) by mouth at bedtime. 90 tablet 0  . Multiple Vitamin (MULTIVITAMIN) tablet Take 1 tablet by mouth daily.      Marland Kitchen neomycin-polymyxin-hydrocortisone (CORTISPORIN) 3.5-10000-1 ophthalmic suspension Place 2 drops into both eyes 4 (four) times daily. 7.5 mL 0  . nitroGLYCERIN (NITROSTAT) 0.4 MG SL tablet Place 1 tablet (0.4 mg total) under the tongue every 5 (five) minutes as needed. (Patient taking differently: Place 0.4 mg under the tongue every 5 (five) minutes as needed. For esophageal spasms if  needed) 15 tablet 1  . pantoprazole (PROTONIX) 40 MG tablet Take 1 tablet (40 mg total) by mouth daily. 90 tablet 3   No current facility-administered medications on file prior to visit.    BP 140/90 mmHg  Pulse 94  Temp(Src) 97.4 F (36.3 C) (Oral)  Resp 20  Ht 5' 1.5" (1.562 m)  Wt 148 lb (67.132 kg)  BMI 27.51 kg/m2  SpO2 95%   1. Risk factors, based on past  M,S,F history.  Cardiovascular risk factors include a history of dyslipidemia  2.  Physical activities: Remains quite active for his age.  No longer able to play golf due to arthritic complaints  3.  Depression/mood: No history depression or mood disorder  4.  Hearing: No major deficits  5.  ADL's: Independent in all aspects of daily living  6.  Fall risk: Mildly increased due to age  46.  Home safety: No problems identified  8.  Height weight, and visual acuity; height and weight stable.  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  Is followed closely by ophthalmology  9.  Counseling: Continue heart healthy diet and regular exercise  10. Lab orders based on risk factors: Laboratory update including renal indices.  A lipid profile will be reviewed  11. Referral : Follow-up ophthalmology  12. Care plan: Continue heart healthy diet and active lifestyle  13. Cognitive assessment: Alert and oriented with normal affect.  No cognitive dysfunction   14. Screening: Patient provided with a written and personalized 5-10 year screening schedule in the AVS.  .  The patient was seen for annual clinical exams with screening lab.  He'll be followed by rheumatology  15. Provider List Update: Includes primary care medicine and rheumatology, ophthalmology, urology and cardiology    Review of Systems  Constitutional: Negative for fever, chills, appetite change and fatigue.  HENT: Negative for congestion, dental problem, ear pain, hearing loss, sore throat, tinnitus, trouble swallowing and voice change.   Eyes: Negative  for pain, discharge and visual disturbance.  Respiratory: Negative for cough, chest tightness, wheezing and stridor.   Cardiovascular: Negative for chest pain, palpitations and leg swelling.  Gastrointestinal: Negative for nausea, vomiting, abdominal pain, diarrhea, constipation, blood in stool and abdominal distention.  Genitourinary: Negative for urgency, hematuria, flank pain, discharge, difficulty urinating and genital sores.  Musculoskeletal: Negative for myalgias, back pain, joint swelling, arthralgias, gait problem and neck stiffness.  Skin: Negative for rash.  Neurological: Negative for dizziness, syncope, speech difficulty, weakness, numbness and headaches.  Hematological: Negative for adenopathy. Does not bruise/bleed easily.  Psychiatric/Behavioral: Negative for behavioral problems and dysphoric mood. The  patient is not nervous/anxious.        Complains of mild memory deficit       Objective:   Physical Exam  Constitutional: He is oriented to person, place, and time. He appears well-developed.  Blood pressure 130/82  HENT:  Head: Normocephalic.  Right Ear: External ear normal.  Left Ear: External ear normal.  Eyes: Conjunctivae and EOM are normal.  Blindness left eye  Neck: Normal range of motion.  Cardiovascular: Normal rate and normal heart sounds.   Pulmonary/Chest: Breath sounds normal.  Rare crackles left base  Abdominal: Bowel sounds are normal.  Ventral hernia  Musculoskeletal: Normal range of motion. He exhibits edema. He exhibits no tenderness.  Neurological: He is alert and oriented to person, place, and time.  Skin:  Actinic keratoses helix left ear  Psychiatric: He has a normal mood and affect. His behavior is normal.          Assessment & Plan:   Preventive health examination RA History chronic kidney disease.  We'll check indices. Hypothyroidism.  We'll check a TSH Dyslipidemia.  Lipid profile will be reviewed BPH.  Status post TUR  Follow-up  rheumatology Recheck here 6 months or as needed

## 2015-06-24 ENCOUNTER — Other Ambulatory Visit: Payer: Self-pay | Admitting: *Deleted

## 2015-06-24 NOTE — Telephone Encounter (Signed)
Open in error

## 2015-07-15 ENCOUNTER — Other Ambulatory Visit: Payer: Self-pay | Admitting: *Deleted

## 2015-07-15 MED ORDER — METOPROLOL SUCCINATE ER 25 MG PO TB24: 25.0000 mg | ORAL_TABLET | Freq: Every day | ORAL | Status: DC

## 2015-07-21 ENCOUNTER — Ambulatory Visit (INDEPENDENT_AMBULATORY_CARE_PROVIDER_SITE_OTHER): Payer: Medicare Other | Admitting: Cardiology

## 2015-07-21 ENCOUNTER — Encounter: Payer: Self-pay | Admitting: Cardiology

## 2015-07-21 VITALS — BP 90/60 | HR 100 | Ht 61.5 in | Wt 149.0 lb

## 2015-07-21 DIAGNOSIS — R0789 Other chest pain: Secondary | ICD-10-CM | POA: Diagnosis not present

## 2015-07-21 DIAGNOSIS — I493 Ventricular premature depolarization: Secondary | ICD-10-CM

## 2015-07-21 NOTE — Progress Notes (Signed)
Cardiology Office Note   Date:  07/21/2015   ID:  Robert Rowe, DOB March 04, 1918, MRN [id number]  PCP:  Robert Cowden, MD  Cardiologist: Darlin Coco MD  No chief complaint on file.     History of Present Illness: Robert Rowe is a 81 y.o. 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 He comes in today for cardiology followup. He has a history of frequent PVCs. He does not feel the PVCs in his chest but feels that when he checks his radial pulse .he has a prior history of known PVCs We had previously seen him several months ago because of some concern over tightness in the chest relieved by sublingual nitroglycerin. In reviewing his old records he has had these complaints in the past. He does not experience any exertional chest discomfort. He develops tightness in his chest if he has an alcoholic drink . The patient gives a history of having had a large hiatal hernia for many years associated with spasms. Dr. Lyla Rowe has done numerous upper endoscopies and dilatations of his esophagus in the past but none in the past 10 years or so. The patient continues to be physically active. He goes to the gym to exercise on a daily basis. He rides an exercise bicycle which does not cause any chest tightness or discomfort. He has had previous stress tests which have been negative for ischemia. 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 Recently the patient has noted some mild ankle edema and he plans to cut back on his dietary salt intake. He has not been having any orthopnea and he sleeps flat. He states that his weight has been stable. His last echocardiogram was 03/03/10 which showed a normal ejection fraction of 60-65% and there was minimal aortic insufficiency and minimal mitral regurgitation. The patient was vacationing in  [place] in April 2014 and awoke one night with chest discomfort which did not respond to nitroglycerin x2. He went to the [facility] where they observed him and did a nuclear stress test on 03/06/13 which showed no ischemia and his ejection fraction is 57% Since his last visit he states that he has slowed down some.  He no longer plays golf.  He still goes to [place] for several weeks every winter. He has had some intermittent right hip pain.  He has been applying some Voltaren gel which is helped. He had a TUR in April by Dr. Era Bumpers for BPH.  Since then he has had some problems with urinary frequency. He notes easy bruising and holds his Plavix from time to time. Today he became somewhat overheated walking in to our office from the hot parking lot.  His blood pressure was low.  We gave him a cold towel for his face and a large cup of water and he feels better.  Past Medical History  Diagnosis Date  . BENIGN PROSTATIC HYPERTROPHY 04/05/2007  . CHEST DISCOMFORT 04/25/2009  . GERD 12/03/2007  . HYPERLIPIDEMIA 04/05/2007  . Osteoarth NOS-Unspec 04/05/2007  . Palpitations 02/12/2010  . PEDAL EDEMA 05/01/2008  . Rheumatoid arthritis(714.0) 04/05/2007  . TRANSIENT ISCHEMIC ATTACK, HX OF 04/05/2007    ?   Robert Rowe DJD (degenerative joint disease)   . Chronic kidney disease     chronic   . Hiatal hernia   .  Dysrhythmia   . Hypothyroidism     Past Surgical History  Procedure Laterality Date  . Hemorrhoid surgery    . Hernia repair      inguinal x5  . Cataract extraction      bilateral  . Knee surgery      left  . Shoulder surgery      left  . Prostate cryoablation      2000  . Esophagogastroduodenoscopy N/A 03/12/2013    Procedure: ESOPHAGOGASTRODUODENOSCOPY (EGD);  Surgeon: Robert Castle, MD;  Location: [facility];  Service: Endoscopy;  Laterality: N/A;  . Balloon dilation N/A 03/12/2013    Procedure: BALLOON DILATION;  Surgeon: Robert Castle, MD;  Location: [facility];  Service: Endoscopy;  Laterality: N/A;  . Transurethral resection of prostate N/A 03/19/2015    Procedure: TRANSURETHRAL RESECTION OF THE PROSTATE (TURP);  Surgeon: Robert Clines, MD;  Location: [facility];  Service: Urology;  Laterality: N/A;     Current Outpatient Prescriptions  Medication Sig Dispense Refill  . clopidogrel (PLAVIX) 75 MG tablet Take 75 mg by mouth daily.     . hydrocortisone 2.5 % cream APPLY TO AFFECTED AREA TWICE DAILY  6  . levothyroxine (SYNTHROID, LEVOTHROID) 50 MCG tablet TAKE 1 TABLET (50 MCG TOTAL) BY MOUTH DAILY. 90 tablet 1  . meloxicam (MOBIC) 7.5 MG tablet Take 7.5 mg by mouth daily.    . methotrexate (RHEUMATREX) 2.5 MG tablet Take by mouth once a week. Take 7.5 mg once a week    . metoprolol succinate (TOPROL-XL) 25 MG 24 hr tablet Take 1 tablet (25 mg total) by mouth at bedtime. 30 tablet 0  . nitroGLYCERIN (NITROSTAT) 0.4 MG SL tablet Place 1 tablet (0.4 mg total) under the tongue every 5 (five) minutes as needed. (Patient taking differently: Place 0.4 mg under the tongue every 5 (five) minutes as needed. For esophageal spasms if needed) 15 tablet 1  . pantoprazole (PROTONIX) 40 MG tablet Take 1 tablet (40 mg total) by mouth daily. 90 tablet 3   No current facility-administered medications for this visit.    Allergies:   Sulfonamide derivatives    Social History:  The patient  reports that he quit smoking about 61 years ago. He has never used smokeless tobacco. He reports that he does not drink alcohol or use illicit drugs.   Family History:  The patient's family history includes Breast cancer in his mother; Heart attack in his father; Heart disease in his father; Lymphoma in his brother. There is no history of Stroke or Hypertension.    ROS:  Please see the history of present illness.   Otherwise, review of systems are positive for none.   All other systems are reviewed and negative.    PHYSICAL EXAM: VS:  BP 90/60 mmHg  Pulse 100  Ht  5' 1.5" (1.562 m)  Wt 149 lb (67.586 kg)  BMI 27.70 kg/m2 , BMI Body mass index is 27.7 kg/(m^2). GEN: Well nourished, well developed, in no acute distress HEENT: normal Neck: no JVD, carotid bruits, or masses Cardiac: RRR; no murmurs, rubs, or gallops,no edema  Respiratory:  clear to auscultation bilaterally, normal work of breathing GI: soft, nontender, nondistended, + BS MS: no deformity or atrophy Skin: warm and dry, no rash Neuro:  Strength and sensation are intact Psych: euthymic mood, full affect   EKG:  EKG is not ordered today.    Recent Labs: 06/22/2015: ALT 12; BUN 18; Creatinine, Ser 1.46; Hemoglobin 15.0; Platelets 241.0; Potassium  4.7; Sodium 139; TSH 2.41    Lipid Panel    Component Value Date/Time   CHOL 221* 06/22/2015 1139   TRIG 134.0 06/22/2015 1139   HDL 41.50 06/22/2015 1139   CHOLHDL 5 06/22/2015 1139   VLDL 26.8 06/22/2015 1139   LDLCALC 152* 06/22/2015 1139      Wt Readings from Last 3 Encounters:  07/21/15 149 lb (67.586 kg)  06/22/15 148 lb (67.132 kg)  03/19/15 152 lb (68.947 kg)        ASSESSMENT AND PLAN:  1. Intermittent PVCs 2. Occasional chest pain triggered by ingesting alcohol or overeating. Relieved by sublingual nitroglycerin. 3. History of GERD and past history of esophageal stricture 4. Past history of TIA, on Plavix long-term, without recurrent symptoms 5. Hyperlipidemia followed by his PCP 6.  Status post TURP for BPH in April 2016 by Dr. Era Bumpers 7.  Hypotension today secondary to mild dehydration and exhaustion  from the hot weather  The patient will continue Toprol-XL 25 mg daily. He was not able to tolerate a higher dose because of worsening malaise and fatigue. Continue same medication. Recheck in 6 months for office visit and EKG.    Current medicines are reviewed at length with the patient today.  The patient does not have concerns regarding medicines.  The following changes have been made:  no  change  Labs/ tests ordered today include:  No orders of the defined types were placed in this encounter.     Disposition: Recheck in 6 months for office visit and EKG.  Drink more fluids in the summer time to avoid dehydration.  Continue current medication.  Berna Spare MD 07/21/2015 3:23 PM    [address], [place], [place]  [address] Phone: [phone]; Fax: [phone]

## 2015-07-21 NOTE — Patient Instructions (Signed)
Medication Instructions:  Your physician recommends that you continue on your current medications as directed. Please refer to the Current Medication list given to you today.  Labwork: none  Testing/Procedures: none  Follow-Up: Your physician wants you to follow-up in: 6 month ov/ekg  You will receive a reminder letter in the mail two months in advance. If you don't receive a letter, please call our office to schedule the follow-up appointment.     

## 2015-08-04 DIAGNOSIS — N359 Urethral stricture, unspecified: Secondary | ICD-10-CM | POA: Diagnosis not present

## 2015-08-06 DIAGNOSIS — D3132 Benign neoplasm of left choroid: Secondary | ICD-10-CM | POA: Diagnosis not present

## 2015-08-06 DIAGNOSIS — H40023 Open angle with borderline findings, high risk, bilateral: Secondary | ICD-10-CM | POA: Diagnosis not present

## 2015-08-06 DIAGNOSIS — H40053 Ocular hypertension, bilateral: Secondary | ICD-10-CM | POA: Diagnosis not present

## 2015-08-06 DIAGNOSIS — Z961 Presence of intraocular lens: Secondary | ICD-10-CM | POA: Diagnosis not present

## 2015-08-11 ENCOUNTER — Other Ambulatory Visit: Payer: Self-pay | Admitting: Cardiology

## 2015-08-11 MED ORDER — METOPROLOL SUCCINATE ER 25 MG PO TB24: 25.0000 mg | ORAL_TABLET | Freq: Every day | ORAL | Status: DC

## 2015-08-27 ENCOUNTER — Emergency Department (HOSPITAL_COMMUNITY)
Admission: EM | Admit: 2015-08-27 | Discharge: 2015-08-27 | Disposition: A | Discharge status: Home / Self Care | Payer: Medicare Other | Attending: Emergency Medicine | Admitting: Emergency Medicine

## 2015-08-27 ENCOUNTER — Encounter (HOSPITAL_COMMUNITY): Payer: Self-pay | Admitting: Emergency Medicine

## 2015-08-27 DIAGNOSIS — Z87891 Personal history of nicotine dependence: Secondary | ICD-10-CM | POA: Insufficient documentation

## 2015-08-27 DIAGNOSIS — E039 Hypothyroidism, unspecified: Secondary | ICD-10-CM | POA: Diagnosis not present

## 2015-08-27 DIAGNOSIS — K219 Gastro-esophageal reflux disease without esophagitis: Secondary | ICD-10-CM | POA: Insufficient documentation

## 2015-08-27 DIAGNOSIS — R404 Transient alteration of awareness: Secondary | ICD-10-CM | POA: Diagnosis not present

## 2015-08-27 DIAGNOSIS — R55 Syncope and collapse: Secondary | ICD-10-CM | POA: Diagnosis not present

## 2015-08-27 DIAGNOSIS — Z7902 Long term (current) use of antithrombotics/antiplatelets: Secondary | ICD-10-CM | POA: Diagnosis not present

## 2015-08-27 DIAGNOSIS — Z79899 Other long term (current) drug therapy: Secondary | ICD-10-CM | POA: Insufficient documentation

## 2015-08-27 DIAGNOSIS — N189 Chronic kidney disease, unspecified: Secondary | ICD-10-CM | POA: Insufficient documentation

## 2015-08-27 DIAGNOSIS — R531 Weakness: Secondary | ICD-10-CM | POA: Diagnosis not present

## 2015-08-27 DIAGNOSIS — Z8673 Personal history of transient ischemic attack (TIA), and cerebral infarction without residual deficits: Secondary | ICD-10-CM | POA: Diagnosis not present

## 2015-08-27 DIAGNOSIS — R110 Nausea: Secondary | ICD-10-CM | POA: Insufficient documentation

## 2015-08-27 DIAGNOSIS — E785 Hyperlipidemia, unspecified: Secondary | ICD-10-CM | POA: Diagnosis not present

## 2015-08-27 LAB — URINALYSIS, ROUTINE W REFLEX MICROSCOPIC
Bilirubin Urine: NEGATIVE
Glucose, UA: NEGATIVE mg/dL
Hgb urine dipstick: NEGATIVE
Ketones, ur: NEGATIVE mg/dL
Leukocytes, UA: NEGATIVE
Nitrite: NEGATIVE
Protein, ur: NEGATIVE mg/dL
Specific Gravity, Urine: 1.009 (ref 1.005–1.030)
Urobilinogen, UA: 0.2 mg/dL (ref 0.0–1.0)
pH: 7.5 (ref 5.0–8.0)

## 2015-08-27 LAB — BASIC METABOLIC PANEL
Anion gap: 8 (ref 5–15)
BUN: 18 mg/dL (ref 6–20)
CO2: 28 mmol/L (ref 22–32)
Calcium: 9.3 mg/dL (ref 8.9–10.3)
Chloride: 100 mmol/L — ABNORMAL LOW (ref 101–111)
Creatinine, Ser: 1.41 mg/dL — ABNORMAL HIGH (ref 0.61–1.24)
GFR calc Af Amer: 48 mL/min — ABNORMAL LOW (ref >60)
GFR calc non Af Amer: 41 mL/min — ABNORMAL LOW (ref >60)
Glucose, Bld: 130 mg/dL — ABNORMAL HIGH (ref 65–99)
Potassium: 4.5 mmol/L (ref 3.5–5.1)
Sodium: 136 mmol/L (ref 135–145)

## 2015-08-27 LAB — CBC
HCT: 43.2 % (ref 39.0–52.0)
Hemoglobin: 14.4 g/dL (ref 13.0–17.0)
MCH: 33 pg (ref 26.0–34.0)
MCHC: 33.3 g/dL (ref 30.0–36.0)
MCV: 98.9 fL (ref 78.0–100.0)
Platelets: 201 10*3/uL (ref 150–400)
RBC: 4.37 MIL/uL (ref 4.22–5.81)
RDW: 13.6 % (ref 11.5–15.5)
WBC: 7.7 10*3/uL (ref 4.0–10.5)

## 2015-08-27 LAB — I-STAT TROPONIN, ED
Troponin i, poc: 0.01 ng/mL (ref 0.00–0.08)
Troponin i, poc: 0 ng/mL (ref 0.00–0.08)

## 2015-08-27 LAB — CBG MONITORING, ED: Glucose-Capillary: 107 mg/dL — ABNORMAL HIGH (ref 65–99)

## 2015-08-27 NOTE — ED Provider Notes (Signed)
CSN: [id number]     Arrival date & time 08/27/15  1701 History   First MD Initiated Contact with Patient 08/27/15 1702     Chief Complaint  Patient presents with  . Near Syncope     (Consider location/radiation/quality/duration/timing/severity/associated sxs/prior Treatment) Patient is a 81 y.o. male presenting with near-syncope. The history is provided by the patient.  Near Syncope This is a new problem. The current episode started 1 to 2 hours ago. The problem occurs constantly. The problem has not changed since onset.Pertinent negatives include no abdominal pain and no shortness of breath. Nothing aggravates the symptoms. Nothing relieves the symptoms.    Past Medical History  Diagnosis Date  . BENIGN PROSTATIC HYPERTROPHY 04/05/2007  . CHEST DISCOMFORT 04/25/2009  . GERD 12/03/2007  . HYPERLIPIDEMIA 04/05/2007  . Osteoarth NOS-Unspec 04/05/2007  . Palpitations 02/12/2010  . PEDAL EDEMA 05/01/2008  . Rheumatoid arthritis(714.0) 04/05/2007  . TRANSIENT ISCHEMIC ATTACK, HX OF 04/05/2007    ?   Marland Kitchen DJD (degenerative joint disease)   . Chronic kidney disease     chronic   . Hiatal hernia   . Dysrhythmia   . Hypothyroidism    Past Surgical History  Procedure Laterality Date  . Hemorrhoid surgery    . Hernia repair      inguinal x5  . Cataract extraction      bilateral  . Knee surgery      left  . Shoulder surgery      left  . Prostate cryoablation      2000  . Esophagogastroduodenoscopy N/A 03/12/2013    Procedure: ESOPHAGOGASTRODUODENOSCOPY (EGD);  Surgeon: Inda Castle, MD;  Location: [facility];  Service: Endoscopy;  Laterality: N/A;  . Balloon dilation N/A 03/12/2013    Procedure: BALLOON DILATION;  Surgeon: Inda Castle, MD;  Location: [facility];  Service: Endoscopy;  Laterality: N/A;  . Transurethral resection of prostate N/A 03/19/2015    Procedure: TRANSURETHRAL RESECTION OF THE PROSTATE (TURP);  Surgeon: Carolan Clines, MD;  Location: [facility];  Service:  Urology;  Laterality: N/A;   Family History  Problem Relation Age of Onset  . Breast cancer Mother   . Lymphoma Brother   . Heart disease Father   . Heart attack Father   . Stroke Neg Hx   . Hypertension Neg Hx    Social History  Substance Use Topics  . Smoking status: Former Smoker    Quit date: 08/13/1953  . Smokeless tobacco: Never Used  . Alcohol Use: No     Comment: patient has given up alcohol    Review of Systems  Constitutional: Positive for diaphoresis. Negative for fever.  Respiratory: Negative for cough and shortness of breath.   Cardiovascular: Positive for near-syncope.  Gastrointestinal: Positive for nausea. Negative for vomiting and abdominal pain.  All other systems reviewed and are negative.     Allergies  Sulfonamide derivatives  Home Medications   Prior to Admission medications   Medication Sig Start Date End Date Taking? Authorizing Provider  clopidogrel (PLAVIX) 75 MG tablet Take 75 mg by mouth daily.  03/20/15   Historical Provider, MD  hydrocortisone 2.5 % cream APPLY TO AFFECTED AREA TWICE DAILY 06/27/15   Historical Provider, MD  levothyroxine (SYNTHROID, LEVOTHROID) 50 MCG tablet TAKE 1 TABLET (50 MCG TOTAL) BY MOUTH DAILY. 12/17/14   Marletta Lor, MD  meloxicam (MOBIC) 7.5 MG tablet Take 7.5 mg by mouth daily.    Historical Provider, MD  methotrexate (RHEUMATREX) 2.5 MG tablet Take  by mouth once a week. Take 7.5 mg once a week    Historical Provider, MD  metoprolol succinate (TOPROL-XL) 25 MG 24 hr tablet Take 1 tablet (25 mg total) by mouth at bedtime. 08/11/15   Darlin Coco, MD  nitroGLYCERIN (NITROSTAT) 0.4 MG SL tablet Place 1 tablet (0.4 mg total) under the tongue every 5 (five) minutes as needed. Patient taking differently: Place 0.4 mg under the tongue every 5 (five) minutes as needed. For esophageal spasms if needed 03/10/15   Marletta Lor, MD  pantoprazole (PROTONIX) 40 MG tablet Take 1 tablet (40 mg total) by mouth daily.  04/13/15   Marletta Lor, MD   BP 130/82 mmHg  Pulse 78  Resp 14  SpO2 97% Physical Exam  Constitutional: He is oriented to person, place, and time. He appears well-developed and well-nourished. No distress.  HENT:  Head: Normocephalic and atraumatic.  Mouth/Throat: No oropharyngeal exudate.  Eyes: EOM are normal. Pupils are equal, round, and reactive to light.  Neck: Normal range of motion. Neck supple.  Cardiovascular: Normal rate and regular rhythm.  Exam reveals no friction rub.   No murmur heard. Pulmonary/Chest: Effort normal and breath sounds normal. No respiratory distress. He has no wheezes. He has no rales.  Abdominal: Soft. He exhibits no distension. There is no tenderness. There is no rebound.  Musculoskeletal: Normal range of motion. He exhibits no edema.  Neurological: He is alert and oriented to person, place, and time. No cranial nerve deficit. He exhibits normal muscle tone. Coordination normal.  Skin: No rash noted. He is not diaphoretic.  Nursing note and vitals reviewed.   ED Course  Procedures (including critical care time) Labs Review Labs Reviewed  BASIC METABOLIC PANEL - Abnormal; Notable for the following:    Chloride 100 (*)    Glucose, Bld 130 (*)    Creatinine, Ser 1.41 (*)    GFR calc non Af Amer 41 (*)    GFR calc Af Amer 48 (*)    All other components within normal limits  CBG MONITORING, ED - Abnormal; Notable for the following:    Glucose-Capillary 107 (*)    All other components within normal limits  CBC  URINALYSIS, ROUTINE W REFLEX MICROSCOPIC (NOT AT [facility])  I-STAT TROPOININ, ED  I-STAT TROPOININ, ED    Imaging Review No results found. I have personally reviewed and evaluated these images and lab results as part of my medical decision-making.   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 He reports history of hypoglycemia, however he didn't check his blood sugar today. No chest pain, SOB, no discomfort of any kind. He said he was lying on the couch and had acute onset of generalized weakness and diaphoresis. He's feeling better now. Exam benign. Will check labs. EKG is ok. No focal weakness on exam, no need for Head CT. Labs ok. Serial troponins ok. Patient doing well. He is an Administrator, [employer], and he does not want to stay in  the hospital. I informed him my choice would be for him to be observed, however he refused. He understands all the risks, I discussed we need to watch for arrhythmia, serial troponins, etc.  Patient has family support and everyone is on board with going home. Stable for discharge.  Evelina Bucy, MD 08/27/15 [phone]

## 2015-08-27 NOTE — ED Notes (Signed)
CBG 107 

## 2015-08-27 NOTE — ED Notes (Signed)
Dr. Walden at bedside 

## 2015-08-27 NOTE — ED Notes (Signed)
Pt reports that he was on the couch about to fall asleep when he suddenly became pale, diaphoretic and felt near syncopal.  Pt denies any chest pain or pressure during this time.  Pt does report some nausea and was given 4mg  Zofran PTA.

## 2015-08-27 NOTE — Discharge Instructions (Signed)

## 2015-08-27 NOTE — ED Notes (Signed)
Provided orange juice to PT

## 2015-08-28 DIAGNOSIS — N359 Urethral stricture, unspecified: Secondary | ICD-10-CM | POA: Diagnosis not present

## 2015-08-28 DIAGNOSIS — N3942 Incontinence without sensory awareness: Secondary | ICD-10-CM | POA: Diagnosis not present

## 2015-08-29 ENCOUNTER — Telehealth (HOSPITAL_COMMUNITY): Payer: Self-pay

## 2015-08-31 ENCOUNTER — Telehealth: Payer: Self-pay | Admitting: Internal Medicine

## 2015-08-31 NOTE — Telephone Encounter (Signed)
Dr.K, does pt need ED follow up?

## 2015-08-31 NOTE — Telephone Encounter (Signed)
Patient called in this morning stating that he went to hospital on last week and wanted to know if the MD needed to see him?

## 2015-09-08 ENCOUNTER — Telehealth: Payer: Self-pay | Admitting: Internal Medicine

## 2015-09-08 DIAGNOSIS — M25519 Pain in unspecified shoulder: Secondary | ICD-10-CM

## 2015-09-08 NOTE — Telephone Encounter (Signed)
Spoke to pt, told him order for referral to [facility] was done. Pt verbalized understanding. Asked pt when appt is? Pt said Thursday.

## 2015-09-08 NOTE — Telephone Encounter (Signed)
Patient called regarding a referral to the orthopedic. Patient would like to be referred [facility] for insurance purpose. Patient is having some shoulder pain.

## 2015-09-10 DIAGNOSIS — S46011A Strain of muscle(s) and tendon(s) of the rotator cuff of right shoulder, initial encounter: Secondary | ICD-10-CM | POA: Diagnosis not present

## 2015-09-15 ENCOUNTER — Other Ambulatory Visit: Payer: Self-pay | Admitting: Urology

## 2015-09-21 ENCOUNTER — Encounter (HOSPITAL_COMMUNITY): Payer: Self-pay | Admitting: *Deleted

## 2015-09-22 ENCOUNTER — Telehealth: Payer: Self-pay | Admitting: Internal Medicine

## 2015-09-22 MED ORDER — LEVOTHYROXINE SODIUM 50 MCG PO TABS: ORAL_TABLET | ORAL | Status: DC

## 2015-09-22 NOTE — Telephone Encounter (Signed)
Pt request refill of the following: levothyroxine (SYNTHROID, LEVOTHROID) 50 MCG tablet   Phamacy: [facility]

## 2015-09-22 NOTE — Telephone Encounter (Signed)
Rx sent to pharmacy   

## 2015-09-24 ENCOUNTER — Ambulatory Visit (HOSPITAL_COMMUNITY)
Admission: RE | Admit: 2015-09-24 | Discharge: 2015-09-24 | Disposition: A | Discharge status: Home / Self Care | Payer: Medicare Other | Source: Ambulatory Visit | Attending: Urology | Admitting: Urology

## 2015-09-24 ENCOUNTER — Ambulatory Visit (HOSPITAL_COMMUNITY): Payer: Medicare Other | Admitting: Anesthesiology

## 2015-09-24 ENCOUNTER — Encounter (HOSPITAL_COMMUNITY): Payer: Self-pay | Admitting: *Deleted

## 2015-09-24 ENCOUNTER — Encounter (HOSPITAL_COMMUNITY)
Admission: RE | Disposition: A | Discharge status: Home / Self Care | Payer: Self-pay | Source: Ambulatory Visit | Attending: Urology

## 2015-09-24 DIAGNOSIS — E7800 Pure hypercholesterolemia, unspecified: Secondary | ICD-10-CM | POA: Diagnosis not present

## 2015-09-24 DIAGNOSIS — R3916 Straining to void: Secondary | ICD-10-CM | POA: Insufficient documentation

## 2015-09-24 DIAGNOSIS — N3942 Incontinence without sensory awareness: Secondary | ICD-10-CM | POA: Insufficient documentation

## 2015-09-24 DIAGNOSIS — Z9849 Cataract extraction status, unspecified eye: Secondary | ICD-10-CM | POA: Diagnosis not present

## 2015-09-24 DIAGNOSIS — E039 Hypothyroidism, unspecified: Secondary | ICD-10-CM | POA: Insufficient documentation

## 2015-09-24 DIAGNOSIS — N401 Enlarged prostate with lower urinary tract symptoms: Secondary | ICD-10-CM | POA: Insufficient documentation

## 2015-09-24 DIAGNOSIS — Z87891 Personal history of nicotine dependence: Secondary | ICD-10-CM | POA: Insufficient documentation

## 2015-09-24 DIAGNOSIS — Z791 Long term (current) use of non-steroidal anti-inflammatories (NSAID): Secondary | ICD-10-CM | POA: Insufficient documentation

## 2015-09-24 DIAGNOSIS — R3912 Poor urinary stream: Secondary | ICD-10-CM | POA: Diagnosis not present

## 2015-09-24 DIAGNOSIS — N99111 Postprocedural bulbous urethral stricture: Secondary | ICD-10-CM | POA: Diagnosis not present

## 2015-09-24 DIAGNOSIS — N3941 Urge incontinence: Secondary | ICD-10-CM | POA: Diagnosis not present

## 2015-09-24 DIAGNOSIS — Z7902 Long term (current) use of antithrombotics/antiplatelets: Secondary | ICD-10-CM | POA: Insufficient documentation

## 2015-09-24 DIAGNOSIS — M1990 Unspecified osteoarthritis, unspecified site: Secondary | ICD-10-CM | POA: Insufficient documentation

## 2015-09-24 DIAGNOSIS — R351 Nocturia: Secondary | ICD-10-CM | POA: Diagnosis not present

## 2015-09-24 DIAGNOSIS — Z79899 Other long term (current) drug therapy: Secondary | ICD-10-CM | POA: Diagnosis not present

## 2015-09-24 DIAGNOSIS — M069 Rheumatoid arthritis, unspecified: Secondary | ICD-10-CM | POA: Insufficient documentation

## 2015-09-24 DIAGNOSIS — K222 Esophageal obstruction: Secondary | ICD-10-CM | POA: Insufficient documentation

## 2015-09-24 DIAGNOSIS — K449 Diaphragmatic hernia without obstruction or gangrene: Secondary | ICD-10-CM | POA: Insufficient documentation

## 2015-09-24 DIAGNOSIS — N189 Chronic kidney disease, unspecified: Secondary | ICD-10-CM | POA: Diagnosis not present

## 2015-09-24 DIAGNOSIS — K219 Gastro-esophageal reflux disease without esophagitis: Secondary | ICD-10-CM | POA: Diagnosis not present

## 2015-09-24 DIAGNOSIS — Z8673 Personal history of transient ischemic attack (TIA), and cerebral infarction without residual deficits: Secondary | ICD-10-CM | POA: Diagnosis not present

## 2015-09-24 DIAGNOSIS — R3914 Feeling of incomplete bladder emptying: Secondary | ICD-10-CM | POA: Insufficient documentation

## 2015-09-24 DIAGNOSIS — N359 Urethral stricture, unspecified: Secondary | ICD-10-CM | POA: Diagnosis not present

## 2015-09-24 HISTORY — PX: CYSTOSCOPY WITH URETHRAL DILATATION: SHX5125

## 2015-09-24 HISTORY — DX: Blindness, one eye, unspecified eye: H54.40

## 2015-09-24 SURGERY — CYSTOSCOPY, WITH URETHRAL DILATION
Anesthesia: Monitor Anesthesia Care

## 2015-09-24 MED ORDER — PHENYLEPHRINE 40 MCG/ML (10ML) SYRINGE FOR IV PUSH (FOR BLOOD PRESSURE SUPPORT)
PREFILLED_SYRINGE | INTRAVENOUS | Status: AC
  Filled 2015-09-24: qty 10

## 2015-09-24 MED ORDER — CEFAZOLIN SODIUM-DEXTROSE 2-3 GM-% IV SOLR
2.0000 g | INTRAVENOUS | Status: AC
  Administered 2015-09-24: 2 g via INTRAVENOUS

## 2015-09-24 MED ORDER — LIDOCAINE HCL 2 % EX GEL
CUTANEOUS | Status: DC | PRN
  Administered 2015-09-24: 1

## 2015-09-24 MED ORDER — ONDANSETRON HCL 4 MG/2ML IJ SOLN
INTRAMUSCULAR | Status: AC
  Filled 2015-09-24: qty 2

## 2015-09-24 MED ORDER — PROPOFOL 10 MG/ML IV BOLUS
INTRAVENOUS | Status: DC | PRN
  Administered 2015-09-24: 20 mg via INTRAVENOUS
  Administered 2015-09-24: 10 mg via INTRAVENOUS

## 2015-09-24 MED ORDER — FENTANYL CITRATE (PF) 100 MCG/2ML IJ SOLN: 25.0000 ug | INTRAMUSCULAR | Status: DC | PRN

## 2015-09-24 MED ORDER — SODIUM CHLORIDE 0.9 % IR SOLN
Status: DC | PRN
  Administered 2015-09-24: 3000 mL

## 2015-09-24 MED ORDER — HYOSCYAMINE SULFATE 0.125 MG SL SUBL: 0.1250 mg | SUBLINGUAL_TABLET | SUBLINGUAL | Status: DC | PRN

## 2015-09-24 MED ORDER — PROPOFOL 10 MG/ML IV BOLUS
INTRAVENOUS | Status: AC
  Filled 2015-09-24: qty 20

## 2015-09-24 MED ORDER — PHENYLEPHRINE HCL 10 MG/ML IJ SOLN
INTRAMUSCULAR | Status: DC | PRN
  Administered 2015-09-24: 80 ug via INTRAVENOUS

## 2015-09-24 MED ORDER — TRIMETHOPRIM 100 MG PO TABS: 100.0000 mg | ORAL_TABLET | ORAL | Status: DC

## 2015-09-24 MED ORDER — PROPOFOL 500 MG/50ML IV EMUL
INTRAVENOUS | Status: DC | PRN
  Administered 2015-09-24: 100 ug/kg/min via INTRAVENOUS

## 2015-09-24 MED ORDER — LACTATED RINGERS IV SOLN
INTRAVENOUS | Status: DC
  Administered 2015-09-24: 1000 mL via INTRAVENOUS

## 2015-09-24 MED ORDER — CEFAZOLIN SODIUM-DEXTROSE 2-3 GM-% IV SOLR
INTRAVENOUS | Status: AC
  Filled 2015-09-24: qty 50

## 2015-09-24 MED ORDER — LIDOCAINE HCL 2 % EX GEL
CUTANEOUS | Status: AC
  Filled 2015-09-24: qty 5

## 2015-09-24 MED ORDER — PHENAZOPYRIDINE HCL 200 MG PO TABS: 200.0000 mg | ORAL_TABLET | Freq: Three times a day (TID) | ORAL | Status: DC | PRN

## 2015-09-24 MED ORDER — IOHEXOL 300 MG/ML  SOLN
INTRAMUSCULAR | Status: DC | PRN
Start: 2015-09-24 — End: 2015-09-24
  Administered 2015-09-24: 50 mL via INTRAVENOUS

## 2015-09-24 MED ORDER — HYDROCODONE-ACETAMINOPHEN 5-300 MG PO TABS: 5.0000 mg | ORAL_TABLET | Freq: Four times a day (QID) | ORAL | Status: DC | PRN

## 2015-09-24 MED ORDER — DEXAMETHASONE SODIUM PHOSPHATE 10 MG/ML IJ SOLN
INTRAMUSCULAR | Status: AC
  Filled 2015-09-24: qty 1

## 2015-09-24 MED ORDER — LIDOCAINE HCL (CARDIAC) 20 MG/ML IV SOLN
INTRAVENOUS | Status: DC | PRN
  Administered 2015-09-24: 50 mg via INTRAVENOUS

## 2015-09-24 MED ORDER — LIDOCAINE HCL (CARDIAC) 20 MG/ML IV SOLN
INTRAVENOUS | Status: AC
  Filled 2015-09-24: qty 5

## 2015-09-24 SURGICAL SUPPLY — 15 items
BAG URO CATCHER STRL LF (DRAPE) ×2 IMPLANT
BALLN NEPHROSTOMY (BALLOONS) ×2
BALLOON NEPHROSTOMY (BALLOONS) ×1 IMPLANT
CATH FOLEY 2W COUNCIL 5CC 18FR (CATHETERS) ×2 IMPLANT
CATH INTERMIT  6FR 70CM (CATHETERS) ×2 IMPLANT
CLOTH BEACON ORANGE TIMEOUT ST (SAFETY) ×2 IMPLANT
GLOVE BIOGEL M STRL SZ7.5 (GLOVE) ×2 IMPLANT
GOWN STRL REUS W/TWL LRG LVL3 (GOWN DISPOSABLE) ×2 IMPLANT
GOWN STRL REUS W/TWL XL LVL3 (GOWN DISPOSABLE) ×2 IMPLANT
GUIDEWIRE STR DUAL SENSOR (WIRE) ×2 IMPLANT
MANIFOLD NEPTUNE II (INSTRUMENTS) ×2 IMPLANT
NS IRRIG 1000ML POUR BTL (IV SOLUTION) ×2 IMPLANT
PACK CYSTO (CUSTOM PROCEDURE TRAY) ×2 IMPLANT
SCRUB PCMX 4 OZ (MISCELLANEOUS) ×2 IMPLANT
TUBING CONNECTING 10 (TUBING) ×2 IMPLANT

## 2015-09-24 NOTE — Transfer of Care (Signed)
Immediate Anesthesia Transfer of Care Note  Patient: Robert Rowe  Procedure(s) Performed: Procedure(s): CYSTOSCOPY WITH COOK BALLOON DILATATION OF URETHRAL STRICTURE  (N/A)  Patient Location: PACU  Anesthesia Type:MAC  Level of Consciousness: sedated and patient cooperative  Airway & Oxygen Therapy: Patient Spontanous Breathing and Patient connected to face mask oxygen  Post-op Assessment: Report given to RN and Post -op Vital signs reviewed and stable  Post vital signs: Reviewed and stable  Last Vitals:  Filed Vitals:   09/24/15 1029  BP:   Pulse:   Temp:   Resp: 18    Complications: No apparent anesthesia complications

## 2015-09-24 NOTE — Anesthesia Postprocedure Evaluation (Signed)
  Anesthesia Post-op Note  Patient: Robert Rowe  Procedure(s) Performed: Procedure(s) (LRB): CYSTOSCOPY WITH COOK BALLOON DILATATION OF URETHRAL STRICTURE  (N/A)  Patient Location: PACU  Anesthesia Type: MAC  Level of Consciousness: awake and alert   Airway and Oxygen Therapy: Patient Spontanous Breathing  Post-op Pain: mild  Post-op Assessment: Post-op Vital signs reviewed, Patient's Cardiovascular Status Stable, Respiratory Function Stable, Patent Airway and No signs of Nausea or vomiting  Last Vitals:  Filed Vitals:   09/24/15 1134  BP: 148/99  Pulse: 79  Temp: 36.6 C  Resp: 16    Post-op Vital Signs: stable   Complications: No apparent anesthesia complications

## 2015-09-24 NOTE — Anesthesia Preprocedure Evaluation (Addendum)
Anesthesia Evaluation  Patient identified by MRN, date of birth, ID band Patient awake    Reviewed: Allergy & Precautions, NPO status , Patient's Chart, lab work & pertinent test results  Airway Mallampati: II  TM Distance: >3 FB Neck ROM: Full    Dental  (+) Dental Advisory Given, Edentulous Upper   Pulmonary neg pulmonary ROS, shortness of breath and with exertion, former smoker,    Pulmonary exam normal breath sounds clear to auscultation       Cardiovascular Normal cardiovascular exam+ dysrhythmias  Rhythm:Regular Rate:Normal  Frequent PVCs   Neuro/Psych TIAnegative psych ROS   GI/Hepatic Neg liver ROS, hiatal hernia, GERD  Medicated and Controlled,Esophageal stricture   Endo/Other  Hypothyroidism   Renal/GU negative Renal ROS  negative genitourinary   Musculoskeletal  (+) Arthritis , Rheumatoid disorders,    Abdominal   Peds negative pediatric ROS (+)  Hematology negative hematology ROS (+)   Anesthesia Other Findings   Reproductive/Obstetrics negative OB ROS                           Anesthesia Physical Anesthesia Plan  ASA: III  Anesthesia Plan: MAC   Post-op Pain Management:    Induction:   Airway Management Planned:   Additional Equipment:   Intra-op Plan:   Post-operative Plan:   Informed Consent:   Plan Discussed with: Surgeon  Anesthesia Plan Comments:        Anesthesia Quick Evaluation

## 2015-09-24 NOTE — H&P (Signed)
Reason For Visit Cystoscopy/possible dilation   Active Problems Problems  1. 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 History of Transurethral Resection Of Prostate (TURP)   Procedure Date: 19 Mar 2015; Assessed By: Carolan Clines (Urology); Last     Assessed: 28 Aug 2015 3. Urethral stricture (N35.9) 4. 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 He complains that his flow has diminished again. He had a cysto/dilation on 08/04/15 and 06/19/15 that showed an anterior urethral stricture. Hx of worsening LUTS (urine flow is spraying, incontinence, incomplete emptying & generalized weakness) since his surgery. Myrbetriq improved his symptoms, but it was too costly ($300+ per month). He is s/p Wolf TURP on 03/19/15.     He has a past history of BPH and elevated PSA. The patient has bladder symptom score sheet of 18/7, which is unchanged since August 2007; nocturia x 1.     A new problem is urge and urge incontinence for which he would like to be treated. The patient has had some difficulty with voiding in the past and is status post Targis microwave thermotherapy in Feb 19, 1999 for BPH symptoms. Tried Enablex, but had some blurred vision. Severe constipation. IPSS=8. He does not want anticholinergics because of constipation side effects.     July 2016: He presents today c/o worsening urinary stream, having to strain to urinate, and post-void dribbling. He also endorses some mild sensory unaware incontinence. He states this has progressively getting worse in the past few weeks. He denies increased frequency or urgency. There has been no dysuria or gross hematuria. He has been afebrile and without pain.   Past Medical History Problems  1. History of Arthritis 2. History of esophageal reflux (Z87.19) 3. History of heartburn (Z87.898) 4. History of  hypercholesterolemia (Z86.39) 5. History of Ischemic Stroke 6. History of Rheumatoid arthritis (M06.9) 7. History of Seizure 8. History of Sprained Hamstring Insertion 9. History of Thyroid Disorder  Surgical History Problems  1. History of Cataract Surgery 2. History of Hemorrhoidectomy 3. History of Inguinal Hernia Repair 4. History of Rotator Cuff Repair 5. History of Transurethral Resection Of Prostate (TURP)  Current Meds 1. Meloxicam 7.5 MG Oral Tablet;  Therapy: [id number] to Recorded 2. Methotrexate TABS;  Therapy: (Recorded:11Nov2009) to Recorded 3. Metoprolol Succinate ER 25 MG Oral Tablet Extended Release 24 Hour;  Therapy: [id number] to Recorded 4. Myrbetriq 50 MG Oral Tablet Extended Release 24 Hour; Take 1 tablet daily;  Therapy: 11Aug2016 to (Evaluate:07Feb2017)  Requested for: 12Aug2016; Last  Rx:11Aug2016 Ordered 5. Pantoprazole Sodium 40 MG Oral Tablet Delayed Release;  Therapy: [id number] to Recorded 6. Plavix TABS;  Therapy: (Recorded:28Oct2008) to Recorded 7. Synthroid 50 MCG Oral Tablet;  Therapy: (Recorded:02Feb2016) to Recorded 8. Uribel 118 MG Oral Capsule; TAKE ONE CAPSULE 3 OR 4 TIMES DAILY AS NEEDED  FOR PAIN;  Therapy: [phone] to (Evaluate:15Sep2016)  Requested for: 29Jul2016; Last  Rx:29Jul2016 Ordered  Allergies Medication  1. Enablex TB24 2. Rapaflo CAPS 3. Sulfa Drugs  Family History Problems  1. Family history of Death In The Family Father   82-heart attack 2. Family history of Death In The Family Mother   100 3. Family history of Family Health Status Number Of Children   3 sons and 1 daughter  Social History Problems  1. Denied: Alcohol Use 2. Caffeine Use   4 per day 3. Former Smoker   quit 66 yrs ago 4.  Marital History - Currently Married 5. Denied: Occupation:   retired 12. Denied: Tobacco Use  Review of Systems Constitutional, skin, eye, otolaryngeal, hematologic/lymphatic, cardiovascular, pulmonary, endocrine,  musculoskeletal, gastrointestinal and psychiatric system(s) were reviewed and pertinent findings if present are noted and are otherwise negative.  Genitourinary: urinary frequency, feelings of urinary urgency, dysuria, nocturia, incontinence, difficulty starting the urinary stream, weak urinary stream, urinary stream starts and stops, incomplete emptying of bladder, post-void dribbling and erectile dysfunction, but no hematuria, no urethral discharge, urine is not foul-smelling, urine not cloudy, no oliguria, no testicular pain and initiating urination does not require straining.  Gastrointestinal: no diarrhea and no constipation.    Vitals Vital Signs [Data Includes: Last 1 Day]  Recorded: 07Oct2016 03:07PM  Blood Pressure: 147 / 86 Temperature: 97.6 F Heart Rate: 82  Physical Exam Constitutional: Well nourished and well developed . No acute distress.  ENT:. The ears and nose are normal in appearance.  Neck: The appearance of the neck is normal and no neck mass is present.  Pulmonary: No respiratory distress and normal respiratory rhythm and effort . No accessory muscle use. Heard on ascultation with prolonged expiratory time and with increased expiratory/inspiratory ratio.  Cardiovascular:. No peripheral edema.  Abdomen: The abdomen is soft and nontender. No masses are palpated. No CVA tenderness. No hernias are palpable. No hepatosplenomegaly noted.  Genitourinary: Examination of the penis demonstrates no discharge, no masses, no lesions and a normal meatus. The penis is circumcised. The scrotum is without lesions. The right epididymis is palpably normal and non-tender. The left epididymis is palpably normal and non-tender. The right testis is non-tender and without masses. The left testis is non-tender and without masses.  Lymphatics: The femoral and inguinal nodes are not enlarged or tender.  Skin: Normal skin turgor, no visible rash and no visible skin lesions.  Neuro/Psych:. Mood and  affect are appropriate.    Results/Data Urine [Data Includes: Last 1 Day]   07Oct2016  COLOR YELLOW   APPEARANCE CLEAR   SPECIFIC GRAVITY 1.010   pH 6.0   GLUCOSE NEGATIVE   BILIRUBIN NEGATIVE   KETONE NEGATIVE   BLOOD NEGATIVE   PROTEIN NEGATIVE   NITRITE NEGATIVE   LEUKOCYTE ESTERASE 1+   SQUAMOUS EPITHELIAL/HPF 0-5 HPF  WBC 20-40 WBC/HPF  RBC NONE SEEN RBC/HPF  BACTERIA FEW HPF  CRYSTALS NONE SEEN HPF  CASTS NONE SEEN LPF  Yeast NONE SEEN HPF   Procedure  Procedure: Cystoscopy  Chaperone Present: kim lewis.  Indication: Lower Urinary Tract Symptoms. Urethral Stricture.  Informed Consent: Risks, benefits, and potential adverse events were discussed and informed consent was obtained from the patient.  Prep: The patient was prepped with betadine.  Anesthesia:. Local anesthesia was administered intraurethrally with 2% lidocaine jelly.  Antibiotic prophylaxis: Ciprofloxacin.  Procedure Note:  Urethral meatus:. No abnormalities.  Anterior urethra: No abnormalities.  Prostatic urethra:. A pinpoint and mild stricture was present in the membranous urethra and was dilated.  Bladder: Visulization was clear. The ureteral orifices were in the normal anatomic position bilaterally. A systematic survey of the bladder demonstrated no bladder tumors or stones. Examination of the bladder demonstrated no clot within the bladder, no trabeculation and no diverticulum no erythematous mucosa, no edema and no cellules. The patient tolerated the procedure well.  Complications: None.    Assessment Assessed  1. History of Transurethral Resection Of Prostate (TURP) 2. Urethral stricture (N35.9) 3. 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 He has decreased size of the urinary stream today of sudden onset, and cystoscopy shows  membranous stricture which is short, and is easily dilated, without bleeding. He is going out of town , and will call if he re-develops his stricture. He may need balloon dilation under anesthesia.   Plan Health Maintenance  1. UA With REFLEX; [Do Not Release]; Status:Resulted - Requires Verification;   Done:  07Oct2016 02:40PM  Pt to call if he develops urethral stricture.   Signatures Electronically signed by : Carolan Clines, M.D.; Aug 28 2015  3:21PM EST

## 2015-09-24 NOTE — Interval H&P Note (Signed)
History and Physical Interval Note:  09/24/2015 9:22 AM  Robert Rowe  has presented today for surgery, with the diagnosis of URETHRAL STRICTURE   The various methods of treatment have been discussed with the patient and family. After consideration of risks, benefits and other options for treatment, the patient has consented to  Procedure(s): [place]  (N/A) as a surgical intervention .  The patient's history has been reviewed, patient examined, no change in status, stable for surgery.  I have reviewed the patient's chart and labs.  Questions were answered to the patient's satisfaction.     Britzy Graul I Delight Bickle

## 2015-09-24 NOTE — Op Note (Signed)
Pre-operative diagnosis : Bulbar urethral stricture  Postoperative diagnosis:  Same  Operation: Cystoscopy, Cook balloon dilation of Bulbar urethral stricture  Surgeon:  S. Gaynelle Arabian, MD  First assistant:  none  Anesthesia:  Mac plus local  Preparation: After appropriate preanesthesia, the patient was brought to the operative room, placed on the operating table in the dorsal supine position where IV Mac anesthetic was introduced. The patient was replaced in the dorsal lithotomy position with a pubis was prepped with antibiotic soaked solution and draped in usual fashion. The history was reviewed R Mims double checked.  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 History of Transurethral Resection Of Prostate (TURP)  Procedure Date: 19 Mar 2015; Assessed By: Carolan Clines (Urology); Last  Assessed: 28 Aug 2015 3. Urethral stricture (N35.9) 4. 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 He had a cysto/dilation on 08/04/15 and 06/19/15 that showed an anterior urethral stricture. Hx of worsening LUTS (urine flow is spraying, incontinence, incomplete emptying & generalized weakness) since his surgery. Myrbetriq improved his symptoms, but it was too costly ($300+ per month). He is s/p Wolf TURP on 03/19/15.    He has a past history of BPH and elevated PSA. The patient has bladder symptom score sheet of 18/7, which is unchanged since August 2007; nocturia x 1.     A new problem is urge and urge incontinence for which he would like to be treated. The patient has had some difficulty with voiding in the past and is status post Targis microwave thermotherapy in 2000 for BPH symptoms. Tried Enablex, but had some blurred vision. Severe constipation. IPSS=8. He does not want  anticholinergics because of constipation side effects.     July 2016: He presents today c/o worsening urinary stream, having to strain to urinate, and post-void dribbling. He also endorses some mild sensory unaware incontinence. He states this has progressively getting worse in the past few weeks. He denies increased frequency or urgency. There has been no dysuria or gross hematuria. He has been afebrile and without pain.    Statement of  Likelihood of Success: Excellent. TIME-OUT observed.:  Procedure:  Cystourethroscopy shows normal-appearing meatus, and distal urethra. Bulbar stricture was identified. Guidewire was passed through the strictured area and coiled in the bladder under fluoroscopic control. Scope was removed. Xylocaine jelly was placed in the urethra. A Cook balloon dilator was then passed into the bladder under fluoroscopic control. Inflation is accomplished with one half saline, one half Omnipaque solution for 5 minutes, 218 atm pressure. The patient had immediately straightening of his urethral stricture. After 5 minutes, the balloon was deflated, and removed. The wire was left in place. Cystoscopy revealed excellent dilation of the urethral stricture. The scope was again removed. An [place] catheter is then passed into the bladder, and 10 mL replaced in the balloon. The catheter is placed to straight drainage. No bleeding was noted. The patient was then awakened and taken to recovery room in good condition.

## 2015-09-24 NOTE — Progress Notes (Signed)
Foley connected to leg bag and instructions given to patient and his wife

## 2015-09-24 NOTE — Progress Notes (Signed)
Patient states he will call his primary Dr. To find out when to restart his Plavix

## 2015-09-24 NOTE — Discharge Instructions (Addendum)
Urethrotomy, Male Urethrotomy is a surgical procedure to treat a section of the urethra that is too narrow. The urethra is the tube that carries urine from your bladder out through the tip of your penis. Narrowing of the urethra (urethral stricture) makes it hard or painful to pass urine. You may also be at risk for more frequent urinary infections. Scarring from infection or injury are common causes of urethral stricture. During urethrotomy, your surgeon first passes a thin telescope (cystoscope) into your urethra. Then the surgeon uses a cutting instrument (cold knife) or a heat source (hot knife) to remove any urethral strictures.  LET [facility] CARE PROVIDER KNOW ABOUT:  Any allergies you have.  All medicines you are taking, including vitamins, herbs, eye drops, creams, and over-the-counter medicines.  Previous problems you or members of your family have had with the use of anesthetics.  Any blood disorders you have.  Previous surgeries you have had.  Medical conditions you have.  Any pacemaker or defibrillator. RISKS AND COMPLICATIONS Generally, this is a safe procedure. However, as with any procedure, problems can occur. The most common problem is having another urethral stricture after surgery. Other less common problems include:   Bleeding.  Burning pain.  Infection.  Needing to put a tube (catheter) in your urethra to prevent the stricture from happening again.  Trouble getting an erection (erectile dysfunction). BEFORE THE PROCEDURE  Do not eat or drink anything after midnight on the night before the procedure or as directed by your health care provider.  Ask your health care provider about changing or stopping your regular medicines. This is especially important if you are taking diabetes medicines or blood thinners.  You may need to have a urine test to check for signs of infection.  You may get antibiotic medicines by injection or through an IV access just  before the procedure. This is to prevent infection.  Take a shower before coming to the hospital for surgery. PROCEDURE This procedure usually takes about 30 minutes. During the procedure, the following may happen:  You will be given one of the following:  A medicine that makes you go to sleep (general anesthetic).  A medicine injected into your spine that numbs your body below the waist (spinal anesthetic).  The tip of your penis will be cleaned with a germ-killing (antibacterial) solution.  The surgeon will pass the cystoscope into your urethra.  Urethral strictures will be cut with the cold or hot knife.  You will most likely have a catheter inserted through your urethra and into your bladder. This is to drain your urine. AFTER THE PROCEDURE  You will stay in the recovery area until the anesthesia wears off and your health care provider says you can go home. Follow all instructions carefully.  Take all medicines as directed by your health care provider.  You may get prescriptions for antibiotics and a pain reliever.  You may have a small bandage on the tip of your penis.  You may be sent home with a catheter in place. Follow your health care provider's instructions on how to care for the catheter at home. You will need to return to have the catheter removed as directed by your health care provider.  You may see some blood leaking around the catheter when you pass urine.  Drink enough fluid to keep your urine clear or pale yellow.  Ask your health care provider when you can go back to your normal activities after the catheter is removed.  Keep all follow-up appointments.   This information is not intended to replace advice given to you by your health care provider. Make sure you discuss any questions you have with your health care provider.   Document Released: 11/12/2013 Document Revised: 03/24/2015 Document Reviewed: 11/12/2013 Elsevier Interactive Patient Education  [employer].    Call Kim at [phone] to get appointment for Monday Am for catheter removal.

## 2015-09-28 ENCOUNTER — Ambulatory Visit: Payer: Self-pay | Admitting: Neurology

## 2015-09-28 ENCOUNTER — Telehealth: Payer: Self-pay | Admitting: Internal Medicine

## 2015-09-28 DIAGNOSIS — N401 Enlarged prostate with lower urinary tract symptoms: Secondary | ICD-10-CM | POA: Diagnosis not present

## 2015-09-28 DIAGNOSIS — N138 Other obstructive and reflux uropathy: Secondary | ICD-10-CM | POA: Diagnosis not present

## 2015-09-28 NOTE — Telephone Encounter (Signed)
Called Neurology and cancelled appt and told the receptionist pt did not receive message about appt and he is at a conference will have him call and reschedule.   Spoke to pt's wife Robert Rowe, told her I cancelled appt with Neurology but pt must call and reschedule soon to be evaluated. Annemarie verbalized understanding. Gave her phone number for Neurology.

## 2015-09-28 NOTE — Telephone Encounter (Signed)
Patient's wife wanted to let you know that the patient does not remember he had an appointment today for the Neurologist at 1:30pm.  She said he's at a conference and cannot make that appointment.

## 2015-10-01 ENCOUNTER — Encounter: Payer: Self-pay | Admitting: Neurology

## 2015-10-01 ENCOUNTER — Ambulatory Visit (INDEPENDENT_AMBULATORY_CARE_PROVIDER_SITE_OTHER): Payer: Medicare Other | Admitting: Neurology

## 2015-10-01 ENCOUNTER — Telehealth: Payer: Self-pay | Admitting: Neurology

## 2015-10-01 VITALS — BP 120/70 | HR 96 | Resp 16 | Wt 147.0 lb

## 2015-10-01 DIAGNOSIS — G450 Vertebro-basilar artery syndrome: Secondary | ICD-10-CM | POA: Insufficient documentation

## 2015-10-01 NOTE — Progress Notes (Signed)
NEUROLOGY FOLLOW UP OFFICE NOTE  Robert Rowe [id number]  HISTORY OF PRESENT ILLNESS: I had the pleasure of seeing Robert Rowe in follow-up in the neurology clinic on 10/01/2015. He is accompanied by his wife and son who help supplement the history. The patient was last seen 7 months ago for recurrent episodes of confusion, "ideas and thoughts running/repeating over and over in my head," then in March 2016 he had 3 episodes concerning for possible seizures, one time he could not get words out, another time he slumped down and became unresponsive. His routine EEG was normal. 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 I had discussed findings with [name] on the phone, likely due to small vessel disease, and controlling vascular risk factors. He is aware of elevated cholesterol but did not want to start any medication. He continues on Plavix taken 5 times a week due to easy bruising and bleeding (when he was having TURP). He had also been scheduled for a 24-hour EEG to further classify these episodes, but he refused to do the test as he was feeling good.  He returns today with his wife and son reporting a change in symptoms over the past month. On 08/27/15, he was alone at home watching TV, when he suddenly started "feeling down," then he broke out in a sweat. He felt temporarily confused. He called his wife then called EMS and was brought to [facility] ER, on arrival he was oriented x 3, BP 130/82, HR 78. EKG reported sinus rhythm, multiple premature compexes, vent; borderline repolarization abnormality; no significant change since last tracing. CBG 107, CBC and BMP unremarkable except for creatinine of 1.41. Urinalysis negative. He wanted to go home and was discharged at baseline. His family reports that since then, he has had 8 episodes where  he suddenly gets weak and if standing, would start listing to his left. His wife tried to help him one time and they both ended up on the floor. He could not move either leg. His son came one time and could not get him up, it appeared he could not move his legs, so he crawled to a stool, then was able to get to his knees, then after a few minutes he felt better and stood up and started walking. Last episode was 3 days ago. His wife also notices that his speech would get very slurred when there episodes occur.   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 He reports that he started having recurrent symptoms in 2001, he was driving to his dentist when a peculiar feeling came over him, "running round in my head," for a couple of minutes. He denied any focal symptoms, he was able to drive without any confusion. He was evaluated at that time with an MRI brain reported as normal. He had another episode in 2004 with a normal EEG. He reports a history of hypoglycemia where he gets shaky, and that his blood glucose levels and BP were unremarkable during these episodes. He did well for several years until 2013 when he had 4 spells of confusion where a sensation of "ideas repeating over and over in his brain" occurred. There is note that he may have had weakness in the right arm with  one of these spells. No associated headache. He was evaluated by neurologist [name], routine EEG normal. MRI brain did not show any acute changes, I personally reviewed scan which showed diffuse atrophy with likely ex vacuo ventriculomegaly, MRA head showed mild intracranial atherosclerosis in the left PCA, distal flow preserved. He again had no further episodes for another 3 years until March 2016 while his wife was driving, she asked him to roll his window down. He reports he was taking a nap, as as he was waking up,  he was trying to reach for the window with his right hand but could not find it (eyes were closed per patient because he was just waking up), then his wife noticed he seemed to be waving his right hand in the air and was trying to talk but could not get words out. He recalls this episode and denied any confusion. It only lasted a few seconds. The second episode occurred a week or so after, he woke up from his nap and was again unable to talk for a few seconds. The last episode occurred 02/22/15 while in a restaurant. They report that he had not eaten that evening, he spoke at a meeting, then went to the restaurant after. He recalls feeling sleepy and went to sleep, but his wife reports that he suddenly slumped down then to the wall on his right side, eyes closed for a few seconds. She called his name and he started to sit up and drank from his cup. His wife reports he was perfectly normal soon after and did not appear confused or drowsy. He denied any associated focal numbness/tingling, or post-event weakness, no headaches, dizziness. He denies any other episodes of speech difficulties. He feels these may be hypoglycemic episodes, and has been advised to use a glucometer in the past but has not done this.   PAST MEDICAL HISTORY: Past Medical History  Diagnosis Date  . BENIGN PROSTATIC HYPERTROPHY 04/05/2007  . CHEST DISCOMFORT 04/25/2009  . GERD 12/03/2007  . HYPERLIPIDEMIA 04/05/2007  . Palpitations 02/12/2010  . PEDAL EDEMA 05/01/2008  . TRANSIENT ISCHEMIC ATTACK, HX OF 04/05/2007    ?   Marland Kitchen Hiatal hernia   . Hypothyroidism   . Dysrhythmia     pvc's   . Chronic kidney disease     chronic   . Osteoarth NOS-Unspec 04/05/2007  . Rheumatoid arthritis(714.0) 04/05/2007  . DJD (degenerative joint disease)   . 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 clopidogrel (PLAVIX) 75 MG tablet  Take 75 mg by mouth See admin instructions. Takes daily every day EXCEPT Sundays and Wednesdays.    . hydrocortisone 2.5 % cream APPLY TO AFFECTED AREA  AT BEDTIME.  6  . hyoscyamine (LEVSIN/SL) 0.125 MG SL tablet Place 1 tablet (0.125 mg total) under the tongue every 4 (four) hours as needed for cramping. 30 tablet 0  . levothyroxine (SYNTHROID, LEVOTHROID) 50 MCG tablet TAKE 1 TABLET (50 MCG TOTAL) BY MOUTH DAILY. 90 tablet 3  . meloxicam (MOBIC) 7.5 MG tablet Take 7.5 mg by mouth daily.    . methotrexate (RHEUMATREX) 2.5 MG tablet Take 7.5 mg by mouth once a week. Take 7.5 mg once a week on Mondays    . metoprolol succinate (TOPROL-XL) 25 MG 24 hr tablet Take 1 tablet (25 mg total) by mouth at bedtime. 30 tablet 6  .  pantoprazole (PROTONIX) 40 MG tablet Take 1 tablet (40 mg total) by mouth daily. 90 tablet 3  . phenazopyridine (PYRIDIUM) 200 MG tablet Take 1 tablet (200 mg total) by mouth 3 (three) times daily as needed for pain (urinary burning. will discolor urine). 30 tablet 3  . nitroGLYCERIN (NITROSTAT) 0.4 MG SL tablet Place 1 tablet (0.4 mg total) under the tongue every 5 (five) minutes as needed. (Patient not taking: Reported on 10/01/2015) 15 tablet 1   No current facility-administered medications on file prior to visit.    ALLERGIES: Allergies  Allergen Reactions  . Sulfonamide Derivatives     Patient does not remember allergy symptoms    FAMILY HISTORY: Family History  Problem Relation Age of Onset  . Breast cancer Mother   . Lymphoma Brother   . Heart disease Father   . Heart attack Father   . Stroke Neg Hx   . Hypertension Neg Hx     SOCIAL HISTORY: Social History   Social History  . Marital Status: Married    Spouse Name: N/A  . Number of Children: N/A  . Years of Education: N/A   Occupational History  . Not on file.   Social History Main Topics  . Smoking status: Former Smoker    Quit date: 08/13/1953  . Smokeless tobacco: Never Used  . Alcohol Use:  0.6 oz/week    1 Shots of liquor per week     Comment: patient has given up alcohol  . Drug Use: No  . Sexual Activity: Not on file   Other Topics Concern  . Not on file   Social History Narrative    REVIEW OF SYSTEMS: Constitutional: No fevers, chills, or sweats, no generalized fatigue, change in appetite Eyes: No visual changes, double vision, eye pain Ear, nose and throat: No hearing loss, ear pain, nasal congestion, sore throat Cardiovascular: No chest pain, palpitations Respiratory:  No shortness of breath at rest or with exertion, wheezes GastrointestinaI: No nausea, vomiting, diarrhea, abdominal pain, fecal incontinence Genitourinary:  No dysuria, urinary retention or frequency Musculoskeletal:  No neck pain, back pain Integumentary: No rash, pruritus, skin lesions Neurological: as above Psychiatric: No depression, insomnia, anxiety Endocrine: No palpitations, fatigue, diaphoresis, mood swings, change in appetite, change in weight, increased thirst Hematologic/Lymphatic:  No anemia, purpura, petechiae. Allergic/Immunologic: no itchy/runny eyes, nasal congestion, recent allergic reactions, rashes  PHYSICAL EXAM: Filed Vitals:   10/01/15 1008  BP: 120/70  Pulse: 96  Resp: 16   General: No acute distress Head:  Normocephalic/atraumatic Neck: supple, no paraspinal tenderness, full range of motion Heart:  Regular rate and rhythm Lungs:  Clear to auscultation bilaterally Back: No paraspinal tenderness Skin/Extremities: No rash, no edema Neurological Exam: alert and oriented to person, place, and time. No aphasia or dysarthria. Fund of knowledge is appropriate.  Recent and remote memory are intact. 2/3 delayed recall.  Attention and concentration are normal.    Able to name objects and repeat phrases. Cranial nerves: Pupils equal, round, reactive to light.  Fundoscopic exam unremarkable, no papilledema. Extraocular movements intact with no nystagmus. Visual fields full.  Facial sensation intact. No facial asymmetry. Tongue, uvula, palate midline.  Motor: Bulk and tone normal, muscle strength 5/5 throughout with no pronator drift.  Sensation to light touch intact.  No extinction to double simultaneous stimulation.  Deep tendon reflexes 2+ throughout, toes downgoing.  Finger to nose testing intact.  Gait narrow-based and steady, able to tandem walk adequately.  Romberg negative.  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 He has had an unremarkable MRI brain and routine EEG in 2013, symptom-free until March 2016, with the last episode, he apparently very briefly slumped down to his right side. His routine EEG was normal. 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 We discussed control of vascular risk factors, his lipid panel is abnormal however he did not want to take medication. He continues on Plavix. 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 MRA head and neck will be ordered to assess intra and extracranial vasculature. Continue Plavix, discuss control of vascular risk factors, particularly lipid control with PCP. His wife was advised to check BP during events. [name] again feels these are related to hypoglycemia, I discussed getting a glucometer and checking blood sugar during these spells as well. We discussed holding off on driving until symptoms have stabilized. They know to go to ER if symptoms worsen or progress. He will follow-up in 1 month.   Thank you for allowing me to participate in his care.  Please do not hesitate to call for any questions or concerns.  The duration of this appointment visit was 25 minutes of  face-to-face time with the patient.  Greater than 50% of this time was spent in counseling, explanation of diagnosis, planning of further management, and coordination of care.   Ellouise Newer, M.D.   CC: Dr. Burnice Logan

## 2015-10-01 NOTE — Patient Instructions (Signed)
1. Schedule MRA head without contrast and neck with and without contrast 2. Check blood pressure when you have the episodes 3. If more prolonged, go to ER immediately 4. Hold off on driving until symptoms have quieted down 5. Follow-up in 1 month

## 2015-10-01 NOTE — Telephone Encounter (Signed)
Order completed

## 2015-10-01 NOTE — Telephone Encounter (Signed)
[facility] called to inform that the MRI Order is incorrect//call back @ [phone]

## 2015-10-14 ENCOUNTER — Other Ambulatory Visit (HOSPITAL_COMMUNITY): Payer: Self-pay

## 2015-10-19 ENCOUNTER — Other Ambulatory Visit: Payer: Self-pay | Admitting: Neurology

## 2015-10-19 ENCOUNTER — Ambulatory Visit (HOSPITAL_COMMUNITY)
Admission: RE | Admit: 2015-10-19 | Discharge: 2015-10-19 | Disposition: A | Discharge status: Home / Self Care | Payer: Medicare Other | Source: Ambulatory Visit | Attending: Neurology | Admitting: Neurology

## 2015-10-19 ENCOUNTER — Ambulatory Visit (HOSPITAL_COMMUNITY): Admission: RE | Admit: 2015-10-19 | Payer: Medicare Other | Source: Ambulatory Visit

## 2015-10-19 DIAGNOSIS — R410 Disorientation, unspecified: Secondary | ICD-10-CM | POA: Diagnosis not present

## 2015-10-19 DIAGNOSIS — I6521 Occlusion and stenosis of right carotid artery: Secondary | ICD-10-CM | POA: Insufficient documentation

## 2015-10-19 DIAGNOSIS — E785 Hyperlipidemia, unspecified: Secondary | ICD-10-CM | POA: Diagnosis not present

## 2015-10-19 DIAGNOSIS — I669 Occlusion and stenosis of unspecified cerebral artery: Secondary | ICD-10-CM | POA: Diagnosis not present

## 2015-10-19 DIAGNOSIS — I6523 Occlusion and stenosis of bilateral carotid arteries: Secondary | ICD-10-CM | POA: Diagnosis not present

## 2015-10-19 DIAGNOSIS — N189 Chronic kidney disease, unspecified: Secondary | ICD-10-CM | POA: Insufficient documentation

## 2015-10-19 DIAGNOSIS — I651 Occlusion and stenosis of basilar artery: Secondary | ICD-10-CM | POA: Diagnosis not present

## 2015-10-19 DIAGNOSIS — G450 Vertebro-basilar artery syndrome: Secondary | ICD-10-CM

## 2015-10-19 DIAGNOSIS — I6503 Occlusion and stenosis of bilateral vertebral arteries: Secondary | ICD-10-CM | POA: Diagnosis not present

## 2015-10-19 LAB — CREATININE, SERUM
Creatinine, Ser: 1.59 mg/dL — ABNORMAL HIGH (ref 0.61–1.24)
GFR calc Af Amer: 41 mL/min — ABNORMAL LOW (ref >60)
GFR calc non Af Amer: 35 mL/min — ABNORMAL LOW (ref >60)

## 2015-10-20 ENCOUNTER — Encounter: Payer: Self-pay | Admitting: Neurology

## 2015-10-21 ENCOUNTER — Telehealth: Payer: Self-pay | Admitting: Neurology

## 2015-10-21 NOTE — Telephone Encounter (Signed)
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 Recurrent transient symptoms most likely due to this. Discussed maximum medical management is recommendation for intracranial stenosis, he was instructed to take Plavix daily and start statin. He expressed understanding. Also discussed that if symptoms recur and persist, do not wait, go to ER immediately.

## 2015-10-30 ENCOUNTER — Ambulatory Visit (INDEPENDENT_AMBULATORY_CARE_PROVIDER_SITE_OTHER): Payer: Medicare Other | Admitting: Internal Medicine

## 2015-10-30 ENCOUNTER — Encounter: Payer: Self-pay | Admitting: Internal Medicine

## 2015-10-30 VITALS — BP 140/100 | HR 82 | Temp 97.6°F | Ht 62.0 in | Wt 149.0 lb

## 2015-10-30 DIAGNOSIS — R002 Palpitations: Secondary | ICD-10-CM

## 2015-10-30 DIAGNOSIS — R609 Edema, unspecified: Secondary | ICD-10-CM | POA: Diagnosis not present

## 2015-10-30 DIAGNOSIS — R0609 Other forms of dyspnea: Secondary | ICD-10-CM

## 2015-10-30 DIAGNOSIS — I493 Ventricular premature depolarization: Secondary | ICD-10-CM

## 2015-10-30 DIAGNOSIS — R0600 Dyspnea, unspecified: Secondary | ICD-10-CM

## 2015-10-30 DIAGNOSIS — K222 Esophageal obstruction: Secondary | ICD-10-CM

## 2015-10-30 LAB — CBC WITH DIFFERENTIAL/PLATELET
Basophils Absolute: 0.1 10*3/uL (ref 0.0–0.1)
Basophils Relative: 0.8 % (ref 0.0–3.0)
Eosinophils Absolute: 0.1 10*3/uL (ref 0.0–0.7)
Eosinophils Relative: 1.5 % (ref 0.0–5.0)
HCT: 44.2 % (ref 39.0–52.0)
Hemoglobin: 14.6 g/dL (ref 13.0–17.0)
Lymphocytes Relative: 21.4 % (ref 12.0–46.0)
Lymphs Abs: 1.4 10*3/uL (ref 0.7–4.0)
MCHC: 33.1 g/dL (ref 30.0–36.0)
MCV: 97.4 fl (ref 78.0–100.0)
Monocytes Absolute: 0.5 10*3/uL (ref 0.1–1.0)
Monocytes Relative: 8 % (ref 3.0–12.0)
Neutro Abs: 4.3 10*3/uL (ref 1.4–7.7)
Neutrophils Relative %: 68.3 % (ref 43.0–77.0)
Platelets: 221 10*3/uL (ref 150.0–400.0)
RBC: 4.54 Mil/uL (ref 4.22–5.81)
RDW: 14 % (ref 11.5–15.5)
WBC: 6.3 10*3/uL (ref 4.0–10.5)

## 2015-10-30 LAB — BASIC METABOLIC PANEL
BUN: 18 mg/dL (ref 6–23)
CO2: 29 mEq/L (ref 19–32)
Calcium: 9 mg/dL (ref 8.4–10.5)
Chloride: 102 mEq/L (ref 96–112)
Creatinine, Ser: 1.43 mg/dL (ref 0.40–1.50)
GFR: 48.83 mL/min — ABNORMAL LOW (ref >60.00)
Glucose, Bld: 108 mg/dL — ABNORMAL HIGH (ref 70–99)
Potassium: 3.9 mEq/L (ref 3.5–5.1)
Sodium: 139 mEq/L (ref 135–145)

## 2015-10-30 LAB — BRAIN NATRIURETIC PEPTIDE: Pro B Natriuretic peptide (BNP): 139 pg/mL — ABNORMAL HIGH (ref 0.0–100.0)

## 2015-10-30 NOTE — Progress Notes (Signed)
Pre visit review using our clinic review tool, if applicable. No additional management support is needed unless otherwise documented below in the visit note. 

## 2015-10-30 NOTE — Progress Notes (Signed)
Subjective:    Patient ID: CID Robert Rowe, male    DOB: 07-19-1918, 81 y.o.   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  He states this has been present for some time but has increased more recently. No complaints include the distal exertion for the past few weeks. He has a history of chronic PVCs that he feels have increased in frequency.  These do not cause any symptoms but he notes these when he checks his pulse.  Denies any orthopnea He did have a fairly complete cardiac evaluation in 2014 that revealed normal findings.  This included a 2-D echo that revealed normal LV function and a low risk nuclear perfusion test.  He denies any exertional chest pain He has been seen by neurology recently.  Complaints today include short-term memory loss.  MRI/MRA revealed a progressive intracranial atherosclerotic disease He has a history of RA and does take meloxicam.  Past Medical History  Diagnosis Date  . BENIGN PROSTATIC HYPERTROPHY 04/05/2007  . CHEST DISCOMFORT 04/25/2009  . GERD 12/03/2007  . HYPERLIPIDEMIA 04/05/2007  . Palpitations 02/12/2010  . PEDAL EDEMA 05/01/2008  . TRANSIENT ISCHEMIC ATTACK, HX OF 04/05/2007    ?   Marland Kitchen Hiatal hernia   . Hypothyroidism   . Dysrhythmia     pvc's   . Chronic kidney disease     chronic   . Osteoarth NOS-Unspec 04/05/2007  . Rheumatoid arthritis(714.0) 04/05/2007  . DJD (degenerative joint disease)   . 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 Marital Status: Married    Spouse Name: N/A  . Number of Children: N/A  . Years of Education: N/A   Occupational History  . Not on file.   Social History Main Topics  . Smoking status: Former Smoker    Quit date: 08/13/1953  . Smokeless tobacco: Never Used  . Alcohol Use: 0.6 oz/week    1 Shots of liquor per week     Comment: patient has given up alcohol  . Drug Use: No  .  Sexual Activity: Not on file   Other Topics Concern  . Not on file   Social History Narrative    Past Surgical History  Procedure Laterality Date  . Hemorrhoid surgery    . Hernia repair      inguinal x5  . Cataract extraction  2001    bilateral  . Knee surgery  arthroscopic    left  . Shoulder surgery  few years ago    left  . Prostate cryoablation      2000  . Esophagogastroduodenoscopy N/A 03/12/2013    Procedure: ESOPHAGOGASTRODUODENOSCOPY (EGD);  Surgeon: Inda Castle, MD;  Location: [facility];  Service: Endoscopy;  Laterality: N/A;  . Balloon dilation N/A 03/12/2013    Procedure: BALLOON DILATION;  Surgeon: Inda Castle, MD;  Location: [facility];  Service: Endoscopy;  Laterality: N/A;  . Transurethral resection of prostate N/A 03/19/2015    Procedure: TRANSURETHRAL RESECTION OF THE PROSTATE (TURP);  Surgeon: Carolan Clines, MD;  Location: [facility];  Service: Urology;  Laterality: N/A;  . Cystoscopy with urethral dilatation N/A 09/24/2015    Procedure: CYSTOSCOPY WITH COOK BALLOON DILATATION OF URETHRAL STRICTURE ;  Surgeon: Carolan Clines, MD;  Location: [facility];  Service: Urology;  Laterality: N/A;    Family History  Problem Relation Age of Onset  .  Breast cancer Mother   . Lymphoma Brother   . Heart disease Father   . Heart attack Father   . Stroke Neg Hx   . Hypertension Neg Hx     Allergies  Allergen Reactions  . Sulfonamide Derivatives     Patient does not remember allergy symptoms    Current Outpatient Prescriptions on File Prior to Visit  Medication Sig Dispense Refill  . clopidogrel (PLAVIX) 75 MG tablet Take 75 mg by mouth See admin instructions. Takes daily every day EXCEPT Sundays and Wednesdays.    . hydrocortisone 2.5 % cream APPLY TO AFFECTED AREA  AT BEDTIME.  6  . levothyroxine (SYNTHROID, LEVOTHROID) 50 MCG tablet TAKE 1 TABLET (50 MCG TOTAL) BY MOUTH DAILY. 90 tablet 3  . meloxicam (MOBIC) 7.5 MG tablet Take 7.5 mg by mouth  daily.    . methotrexate (RHEUMATREX) 2.5 MG tablet Take 7.5 mg by mouth once a week. Take 7.5 mg once a week on Mondays    . metoprolol succinate (TOPROL-XL) 25 MG 24 hr tablet Take 1 tablet (25 mg total) by mouth at bedtime. 30 tablet 6  . nitroGLYCERIN (NITROSTAT) 0.4 MG SL tablet Place 1 tablet (0.4 mg total) under the tongue every 5 (five) minutes as needed. 15 tablet 1  . pantoprazole (PROTONIX) 40 MG tablet Take 1 tablet (40 mg total) by mouth daily. 90 tablet 3  . hyoscyamine (LEVSIN/SL) 0.125 MG SL tablet Place 1 tablet (0.125 mg total) under the tongue every 4 (four) hours as needed for cramping. (Patient not taking: Reported on 10/30/2015) 30 tablet 0  . phenazopyridine (PYRIDIUM) 200 MG tablet Take 1 tablet (200 mg total) by mouth 3 (three) times daily as needed for pain (urinary burning. will discolor urine). (Patient not taking: Reported on 10/30/2015) 30 tablet 3   No current facility-administered medications on file prior to visit.    BP 140/100 mmHg  Pulse 82  Temp(Src) 97.6 F (36.4 C) (Oral)  Ht 5\' 2"  (1.575 m)  Wt 149 lb (67.586 kg)  BMI 27.25 kg/m2  SpO2 98%     Review of Systems  Constitutional: Positive for activity change. Negative for fever, chills, appetite change and fatigue.  HENT: Negative for congestion, dental problem, ear pain, hearing loss, sore throat, tinnitus, trouble swallowing and voice change.   Eyes: Negative for pain, discharge and visual disturbance.  Respiratory: Positive for shortness of breath. Negative for cough, chest tightness, wheezing and stridor.   Cardiovascular: Positive for palpitations and leg swelling. Negative for chest pain.  Gastrointestinal: Negative for nausea, vomiting, abdominal pain, diarrhea, constipation, blood in stool and abdominal distention.  Genitourinary: Negative for urgency, hematuria, flank pain, discharge, difficulty urinating and genital sores.  Musculoskeletal: Negative for myalgias, back pain, joint swelling,  arthralgias, gait problem and neck stiffness.  Skin: Negative for rash.  Neurological: Negative for dizziness, syncope, speech difficulty, weakness, numbness and headaches.  Hematological: Negative for adenopathy. Does not bruise/bleed easily.  Psychiatric/Behavioral: Negative for behavioral problems and dysphoric mood. The patient is not nervous/anxious.        Objective:   Physical Exam  Constitutional: He is oriented to person, place, and time. He appears well-developed.  Blood pressure on arrival 140 over 100 Follow blood pressure 120/82  HENT:  Head: Normocephalic.  Right Ear: External ear normal.  Left Ear: External ear normal.  Cerumen.  Right canal  Eyes: Conjunctivae and EOM are normal.  Neck: Normal range of motion.  Prominent jugular venous pressure, supine  Cardiovascular:  Normal rate and normal heart sounds.   A few ectopics  Pulmonary/Chest: Effort normal.  A few crackles right base  Abdominal: Bowel sounds are normal.  Musculoskeletal: Normal range of motion. He exhibits edema. He exhibits no tenderness.  Neurological: He is alert and oriented to person, place, and time.  Psychiatric: He has a normal mood and affect. His behavior is normal.          Assessment & Plan:   DOE, with worsening pedal edema.  Rule out congestive heart failure.  Will check a BNP.  Low-salt diet recommended discontinuation of meloxicam.  Recommended continue present medications at this time.  May need furosemide Ace inhibition and cardiology referral if BNP elevated RA.  Meloxicam discontinued Pedal edema.  Low-salt diet recommended Atherosclerotic cerebrovascular disease.  Follow-up neurology.  Continue Plavix History of esophageal stricture.  Continue PPI therapy.  Patient does complain of some more swallowing difficulties  Recheck one month or as needed

## 2015-10-30 NOTE — Patient Instructions (Signed)
Limit your sodium (Salt) intake  Call for any worsening symptoms  Cardiology follow-up as scheduled

## 2015-11-02 DIAGNOSIS — N99113 Postprocedural anterior urethral stricture: Secondary | ICD-10-CM | POA: Diagnosis not present

## 2015-11-03 ENCOUNTER — Telehealth: Payer: Self-pay

## 2015-11-03 ENCOUNTER — Other Ambulatory Visit: Payer: Self-pay | Admitting: Internal Medicine

## 2015-11-03 DIAGNOSIS — M069 Rheumatoid arthritis, unspecified: Secondary | ICD-10-CM

## 2015-11-03 MED ORDER — METHOTREXATE 2.5 MG PO TABS
7.5000 mg | ORAL_TABLET | ORAL | Status: DC
Start: 2015-11-03 — End: 2015-11-30

## 2015-11-03 NOTE — Telephone Encounter (Signed)
Pt would like you to give him a call. Pt state is personal and is about medication  Pt phone number:[phone]

## 2015-11-05 ENCOUNTER — Other Ambulatory Visit: Payer: Self-pay | Admitting: Family Medicine

## 2015-11-05 MED ORDER — CLOPIDOGREL BISULFATE 75 MG PO TABS: 75.0000 mg | ORAL_TABLET | ORAL | Status: DC

## 2015-11-09 ENCOUNTER — Encounter: Payer: Self-pay | Admitting: Neurology

## 2015-11-09 ENCOUNTER — Ambulatory Visit (INDEPENDENT_AMBULATORY_CARE_PROVIDER_SITE_OTHER): Payer: Medicare Other | Admitting: Neurology

## 2015-11-09 VITALS — BP 144/68 | HR 78 | Wt 150.5 lb

## 2015-11-09 DIAGNOSIS — G3184 Mild cognitive impairment, so stated: Secondary | ICD-10-CM

## 2015-11-09 DIAGNOSIS — I679 Cerebrovascular disease, unspecified: Secondary | ICD-10-CM

## 2015-11-09 MED ORDER — DONEPEZIL HCL 10 MG PO TABS: ORAL_TABLET | ORAL | Status: DC

## 2015-11-09 NOTE — Patient Instructions (Addendum)
1. Start Aricept 10mg : Take 1/2 tablet daily for 2 weeks, then increase to 1 tablet daily 2. Continue Plavix daily 3. Continue control of cholesterol, BP 4. Continue to monitor driving 5. If you have weakness on one side, speech difficulties, please go to ER immediately 6. Follow-up in 4 months, call for any problems

## 2015-11-09 NOTE — Progress Notes (Signed)
NEUROLOGY FOLLOW UP OFFICE NOTE  JADEIN THALMAN YG:[id number]  HISTORY OF PRESENT ILLNESS: I had the pleasure of seeing Robert Rowe in follow-up in the neurology clinic on 11/09/2015. He is alone today. The patient was last seen a month ago due to family concerns of change in symptoms, where he would have bilateral leg weakness with slurred speech. Records and images were personally reviewed. I personally reviewed MRA head and neck. There was note of intracranial stenosis, with significant intracranial stenosis in basilar, right ICA cavernous/supraclinoid junction, moderate narrowing in bilateral ACA, right ACA, distal right vertebral, bilateral PCA stenosis. Recurrent symptoms concerning for hypoperfusion causing recurrent TIAs. We discussed maximum medical management, and taking Plavix regularly (instead of Plavix only 5 days a week), as well as cholesterol control, and avoiding hypotension. He presents today reporting that he had difficulties with lipid-lowering agents, and "at my age," does not want to take any medication for cholesterol. He denies any further episodes of leg weakness or loss of consciousness that he had initially presented with. His main concern today is his memory, he "can tell the difference." He cannot remember information from "seconds" ago. He denies getting lost driving.   HPI: Robert Rowe is a very pleasant 81 yo RH man with a history of rheumatoid arthritis, PVCs, hypothyroidism, who initially presented for evaluation of recurrent spells. He reports that he started having recurrent symptoms in 2001, he was driving to his dentist when a peculiar feeling came over him, "running round in my head," for a couple of minutes. He denied any focal symptoms, he was able to drive without any confusion. He was evaluated at that time with an MRI brain reported as normal. He had another episode in 2004 with a normal EEG. He reports a history of hypoglycemia where he gets shaky, and that his  blood glucose levels and BP were unremarkable during these episodes. He did well for several years until 2013 when he had 4 spells of confusion where a sensation of "ideas repeating over and over in his brain" occurred. There is note that he may have had weakness in the right arm with one of these spells. No associated headache. He was evaluated by neurologist [name], routine EEG normal. MRI brain did not show any acute changes, I personally reviewed scan which showed diffuse atrophy with likely ex vacuo ventriculomegaly, MRA head showed mild intracranial atherosclerosis in the left PCA, distal flow preserved. He again had no further episodes for another 3 years until March 2016 while his wife was driving, she asked him to roll his window down. He reports he was taking a nap, as as he was waking up, he was trying to reach for the window with his right hand but could not find it (eyes were closed per patient because he was just waking up), then his wife noticed he seemed to be waving his right hand in the air and was trying to talk but could not get words out. He recalls this episode and denied any confusion. It only lasted a few seconds. The second episode occurred a week or so after, he woke up from his nap and was again unable to talk for a few seconds. The last episode occurred 02/22/15 while in a restaurant. They report that he had not eaten that evening, he spoke at a meeting, then went to the restaurant after. He recalls feeling sleepy and went to sleep, but his wife reports that he suddenly slumped down then to the  wall on his right side, eyes closed for a few seconds. She called his name and he started to sit up and drank from his cup. His wife reports he was perfectly normal soon after and did not appear confused or drowsy. He denied any associated focal numbness/tingling, or post-event weakness, no headaches, dizziness. He denies any other episodes of speech difficulties. He feels these may be hypoglycemic  episodes, and has been advised to use a glucometer in the past but has not done this.   His routine EEG was normal. 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 I had discussed findings with Robert Rowe on the phone, likely due to small vessel disease, and controlling vascular risk factors. He is aware of elevated cholesterol but did not want to start any medication. He continues on Plavix taken 5 times a week due to easy bruising and bleeding (when he was having TURP).   On his visit in November 2016, his wife and son reported a change in symptoms over the course of a month. On 08/27/15, he was alone at home watching TV, when he suddenly started "feeling down," then he broke out in a sweat. He felt temporarily confused. He called his wife then called EMS and was brought to [facility] ER, on arrival he was oriented x 3, BP 130/82, HR 78. EKG reported sinus rhythm, multiple premature compexes, vent; borderline repolarization abnormality; no significant change since last tracing. CBG 107, CBC and BMP unremarkable except for creatinine of 1.41. Urinalysis negative. He wanted to go home and was discharged at baseline. His family reports that since then, he has had 8 episodes where he suddenly gets weak and if standing, would start listing to his left. His wife tried to help him one time and they both ended up on the floor. He could not move either leg. His son came one time and could not get him up, it appeared he could not move his legs, so he crawled to a stool, then was able to get to his knees, then after a few minutes he felt better and stood up and started walking. Last episode was 3 days ago. His wife also notices that his speech would get very slurred when there episodes occur.   PAST MEDICAL HISTORY: Past Medical History  Diagnosis Date  . BENIGN  PROSTATIC HYPERTROPHY 04/05/2007  . CHEST DISCOMFORT 04/25/2009  . GERD 12/03/2007  . HYPERLIPIDEMIA 04/05/2007  . Palpitations 02/12/2010  . PEDAL EDEMA 05/01/2008  . TRANSIENT ISCHEMIC ATTACK, HX OF 04/05/2007    ?   Marland Kitchen Hiatal hernia   . Hypothyroidism   . Dysrhythmia     pvc's   . Chronic kidney disease     chronic   . Osteoarth NOS-Unspec 04/05/2007  . Rheumatoid arthritis(714.0) 04/05/2007  . DJD (degenerative joint disease)   . 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 clopidogrel (PLAVIX) 75 MG tablet Take 1 tablet (75 mg total) by mouth See admin instructions. Takes daily every day EXCEPT Sundays and Wednesdays. 30 tablet 0  . hydrocortisone 2.5 % cream APPLY TO AFFECTED AREA  AT BEDTIME.  6  . levothyroxine (SYNTHROID, LEVOTHROID) 50 MCG tablet TAKE 1 TABLET (50 MCG TOTAL) BY MOUTH DAILY. 90 tablet 3  .  meloxicam (MOBIC) 7.5 MG tablet Take 7.5 mg by mouth daily.    . methotrexate (RHEUMATREX) 2.5 MG tablet Take 3 tablets (7.5 mg total) by mouth once a week. Take 7.5 mg once a week on Mondays 12 tablet 0  . metoprolol succinate (TOPROL-XL) 25 MG 24 hr tablet Take 1 tablet (25 mg total) by mouth at bedtime. 30 tablet 6  . nitroGLYCERIN (NITROSTAT) 0.4 MG SL tablet Place 1 tablet (0.4 mg total) under the tongue every 5 (five) minutes as needed. 15 tablet 1  . pantoprazole (PROTONIX) 40 MG tablet Take 1 tablet (40 mg total) by mouth daily. 90 tablet 3   No current facility-administered medications on file prior to visit.    ALLERGIES: Allergies  Allergen Reactions  . Sulfonamide Derivatives     Patient does not remember allergy symptoms    FAMILY HISTORY: Family History  Problem Relation Age of Onset  . Breast cancer Mother   . Lymphoma Brother   . Heart disease Father   . Heart attack Father   . Stroke Neg Hx   . Hypertension Neg Hx     SOCIAL  HISTORY: Social History   Social History  . Marital Status: Married    Spouse Name: N/A  . Number of Children: N/A  . Years of Education: N/A   Occupational History  . Not on file.   Social History Main Topics  . Smoking status: Former Smoker    Quit date: 08/13/1953  . Smokeless tobacco: Never Used  . Alcohol Use: 0.6 oz/week    1 Shots of liquor per week     Comment: patient has given up alcohol  . Drug Use: No  . Sexual Activity: Not on file   Other Topics Concern  . Not on file   Social History Narrative    REVIEW OF SYSTEMS: Constitutional: No fevers, chills, or sweats, no generalized fatigue, change in appetite Eyes: No visual changes, double vision, eye pain Ear, nose and throat: No hearing loss, ear pain, nasal congestion, sore throat Cardiovascular: No chest pain, palpitations Respiratory:  No shortness of breath at rest or with exertion, wheezes GastrointestinaI: No nausea, vomiting, diarrhea, abdominal pain, fecal incontinence Genitourinary:  No dysuria, urinary retention or frequency Musculoskeletal:  No neck pain, back pain Integumentary: No rash, pruritus, skin lesions Neurological: as above Psychiatric: No depression, insomnia, anxiety Endocrine: No palpitations, fatigue, diaphoresis, mood swings, change in appetite, change in weight, increased thirst Hematologic/Lymphatic:  No anemia, purpura, petechiae. Allergic/Immunologic: no itchy/runny eyes, nasal congestion, recent allergic reactions, rashes  PHYSICAL EXAM: Filed Vitals:   11/09/15 1305  BP: 144/68  Pulse: 78   General: No acute distress Head:  Normocephalic/atraumatic Neck: supple, no paraspinal tenderness, full range of motion Heart:  Regular rate and rhythm Lungs:  Clear to auscultation bilaterally Back: No paraspinal tenderness Skin/Extremities: No rash, no edema Neurological Exam: alert and oriented to person, place, and time. No aphasia or dysarthria. Fund of knowledge is  appropriate.  Recent and remote memory are intact. 0/3 delayed recall.  Attention and concentration are normal.    Able to name objects and repeat phrases. CDT 5/5 MMSE - Mini Mental State Exam 11/09/2015  Orientation to time 5  Orientation to Place 5  Registration 3  Attention/ Calculation 5  Recall 0  Language- name 2 objects 2  Language- repeat 1  Language- follow 3 step command 3  Language- read & follow direction 1  Write a sentence 1  Copy design 1  Total  score 27   Cranial nerves: Pupils equal, round, reactive to light.  Fundoscopic exam unremarkable, no papilledema. Extraocular movements intact with no nystagmus. Visual fields full. Facial sensation intact. No facial asymmetry. Tongue, uvula, palate midline.  Motor: Bulk and tone normal, muscle strength 5/5 throughout with no pronator drift.  Sensation to light touch intact.  No extinction to double simultaneous stimulation.  Deep tendon reflexes 2+ throughout, toes downgoing.  Finger to nose testing intact.  Gait narrow-based and steady, able to tandem walk adequately.  Romberg negative.  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 He has had an unremarkable MRI brain and routine EEG in 2013, symptom-free until March 2016, with the last episode, he apparently very briefly slumped down to his right side. His routine EEG was normal. 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 Last month, his family was concerned about episodes of bilateral leg weakness and slurred speech. MRA head and neck showed significant intracranial stenosis in the anterior and posterior circulation. We discussed maximum medical management, he knows to go to the ER if symptoms recur and progress/worsen. His main concern today is memory loss,  MMSE 27/30. Symptoms suggestive of mild cognitive impairment, possible mild dementia. He is agreeable to starting Aricept 5mg  daily x 2 weeks, then increase to 10mg  daily. Side effects and expectation from medication were discussed. We discussed control of vascular risk factors and continue Plavix daily. We discussed driving, he denies getting lost, and will continue to monitor this. He will follow-up in 4 months and knows to call for any changes.  Thank you for allowing me to participate in his care.  Please do not hesitate to call for any questions or concerns.  The duration of this appointment visit was 25 minutes of face-to-face time with the patient.  Greater than 50% of this time was spent in counseling, explanation of diagnosis, planning of further management, and coordination of care.   Ellouise Newer, M.D.   CC: Dr. Burnice Logan

## 2015-11-11 ENCOUNTER — Telehealth: Payer: Self-pay | Admitting: Neurology

## 2015-11-11 ENCOUNTER — Telehealth: Payer: Self-pay | Admitting: *Deleted

## 2015-11-11 DIAGNOSIS — G3184 Mild cognitive impairment, so stated: Secondary | ICD-10-CM

## 2015-11-11 MED ORDER — DONEPEZIL HCL 10 MG PO TABS: 10.0000 mg | ORAL_TABLET | Freq: Every day | ORAL | Status: DC

## 2015-11-11 NOTE — Telephone Encounter (Signed)
[facility] faxed a refill request for Fluticasone Prop 62mcg spray-2 sprays into both nostrils daily.  I do not see this medication on the pts current medication list.

## 2015-11-11 NOTE — Telephone Encounter (Signed)
Pt has some questions about medication please call [phone] or [phone]

## 2015-11-11 NOTE — Telephone Encounter (Signed)
I returned call to patient. He stated that he would rather have a 90 day Rx for his Aricept so he wouldn't have to go to the pharmacy every month. I told him that I would resend the Rx to his pharmacy for a 90 day supply.

## 2015-11-12 MED ORDER — FLUTICASONE PROPIONATE 50 MCG/ACT NA SUSP: 2.0000 | Freq: Every day | NASAL | Status: DC

## 2015-11-12 NOTE — Telephone Encounter (Signed)
Rx sent to pharmacy, pt was on it before.

## 2015-11-14 DIAGNOSIS — R079 Chest pain, unspecified: Secondary | ICD-10-CM | POA: Diagnosis not present

## 2015-11-15 ENCOUNTER — Emergency Department (HOSPITAL_COMMUNITY): Payer: Medicare Other

## 2015-11-15 ENCOUNTER — Observation Stay (HOSPITAL_COMMUNITY)
Admission: EM | Admit: 2015-11-15 | Discharge: 2015-11-15 | Disposition: A | Discharge status: Home / Self Care | Payer: Medicare Other | Attending: Internal Medicine | Admitting: Internal Medicine

## 2015-11-15 ENCOUNTER — Encounter (HOSPITAL_COMMUNITY): Payer: Self-pay | Admitting: Emergency Medicine

## 2015-11-15 DIAGNOSIS — Z7902 Long term (current) use of antithrombotics/antiplatelets: Secondary | ICD-10-CM | POA: Diagnosis not present

## 2015-11-15 DIAGNOSIS — Z87891 Personal history of nicotine dependence: Secondary | ICD-10-CM | POA: Diagnosis not present

## 2015-11-15 DIAGNOSIS — R071 Chest pain on breathing: Secondary | ICD-10-CM

## 2015-11-15 DIAGNOSIS — N183 Chronic kidney disease, stage 3 (moderate): Secondary | ICD-10-CM | POA: Insufficient documentation

## 2015-11-15 DIAGNOSIS — Z8673 Personal history of transient ischemic attack (TIA), and cerebral infarction without residual deficits: Secondary | ICD-10-CM | POA: Insufficient documentation

## 2015-11-15 DIAGNOSIS — R079 Chest pain, unspecified: Secondary | ICD-10-CM

## 2015-11-15 DIAGNOSIS — R1110 Vomiting, unspecified: Secondary | ICD-10-CM | POA: Diagnosis not present

## 2015-11-15 DIAGNOSIS — E785 Hyperlipidemia, unspecified: Secondary | ICD-10-CM

## 2015-11-15 DIAGNOSIS — E039 Hypothyroidism, unspecified: Secondary | ICD-10-CM

## 2015-11-15 DIAGNOSIS — R0789 Other chest pain: Secondary | ICD-10-CM | POA: Diagnosis not present

## 2015-11-15 DIAGNOSIS — Z79899 Other long term (current) drug therapy: Secondary | ICD-10-CM | POA: Insufficient documentation

## 2015-11-15 DIAGNOSIS — M069 Rheumatoid arthritis, unspecified: Secondary | ICD-10-CM | POA: Insufficient documentation

## 2015-11-15 DIAGNOSIS — K219 Gastro-esophageal reflux disease without esophagitis: Secondary | ICD-10-CM | POA: Diagnosis not present

## 2015-11-15 DIAGNOSIS — K210 Gastro-esophageal reflux disease with esophagitis: Secondary | ICD-10-CM

## 2015-11-15 LAB — CBC
HCT: 41.6 % (ref 39.0–52.0)
Hemoglobin: 14 g/dL (ref 13.0–17.0)
MCH: 33.4 pg (ref 26.0–34.0)
MCHC: 33.7 g/dL (ref 30.0–36.0)
MCV: 99.3 fL (ref 78.0–100.0)
Platelets: 218 10*3/uL (ref 150–400)
RBC: 4.19 MIL/uL — ABNORMAL LOW (ref 4.22–5.81)
RDW: 13.6 % (ref 11.5–15.5)
WBC: 7.1 10*3/uL (ref 4.0–10.5)

## 2015-11-15 LAB — BASIC METABOLIC PANEL
Anion gap: 8 (ref 5–15)
BUN: 22 mg/dL — ABNORMAL HIGH (ref 6–20)
CO2: 28 mmol/L (ref 22–32)
Calcium: 9.4 mg/dL (ref 8.9–10.3)
Chloride: 104 mmol/L (ref 101–111)
Creatinine, Ser: 1.69 mg/dL — ABNORMAL HIGH (ref 0.61–1.24)
GFR calc Af Amer: 38 mL/min — ABNORMAL LOW (ref >60)
GFR calc non Af Amer: 33 mL/min — ABNORMAL LOW (ref >60)
Glucose, Bld: 169 mg/dL — ABNORMAL HIGH (ref 65–99)
Potassium: 4.2 mmol/L (ref 3.5–5.1)
Sodium: 140 mmol/L (ref 135–145)

## 2015-11-15 LAB — LIPID PANEL
Cholesterol: 219 mg/dL — ABNORMAL HIGH (ref 0–200)
HDL: 33 mg/dL — ABNORMAL LOW (ref >40)
LDL Cholesterol: 116 mg/dL — ABNORMAL HIGH (ref 0–99)
Total CHOL/HDL Ratio: 6.6 RATIO
Triglycerides: 349 mg/dL — ABNORMAL HIGH (ref <150)
VLDL: 70 mg/dL — ABNORMAL HIGH (ref 0–40)

## 2015-11-15 LAB — TROPONIN I
Troponin I: <0.03 ng/mL (ref <0.031)
Troponin I: <0.03 ng/mL (ref <0.031)
Troponin I: <0.03 ng/mL (ref <0.031)
Troponin I: <0.03 ng/mL (ref <0.031)

## 2015-11-15 LAB — MAGNESIUM: Magnesium: 1.9 mg/dL (ref 1.7–2.4)

## 2015-11-15 LAB — TSH: TSH: 3.014 u[IU]/mL (ref 0.350–4.500)

## 2015-11-15 MED ORDER — ACETAMINOPHEN 10 MG/ML IV SOLN: 1000.0000 mg | Freq: Once | INTRAVENOUS | Status: DC

## 2015-11-15 MED ORDER — SODIUM CHLORIDE 0.9 % IV SOLN
INTRAVENOUS | Status: DC
  Administered 2015-11-15: 50 mL/h via INTRAVENOUS

## 2015-11-15 MED ORDER — NITROGLYCERIN 0.4 MG SL SUBL
0.4000 mg | SUBLINGUAL_TABLET | SUBLINGUAL | Status: DC | PRN
Start: 2015-11-15 — End: 2015-11-15

## 2015-11-15 MED ORDER — PANTOPRAZOLE SODIUM 40 MG PO TBEC
40.0000 mg | DELAYED_RELEASE_TABLET | Freq: Every day | ORAL | Status: DC
Start: 2015-11-15 — End: 2015-11-15

## 2015-11-15 MED ORDER — MORPHINE SULFATE (PF) 2 MG/ML IV SOLN: 2.0000 mg | INTRAVENOUS | Status: DC | PRN

## 2015-11-15 MED ORDER — ACETAMINOPHEN 325 MG PO TABS: 650.0000 mg | ORAL_TABLET | ORAL | Status: DC | PRN

## 2015-11-15 MED ORDER — CLOPIDOGREL BISULFATE 75 MG PO TABS: 75.0000 mg | ORAL_TABLET | ORAL | Status: DC

## 2015-11-15 MED ORDER — GI COCKTAIL ~~LOC~~
30.0000 mL | Freq: Four times a day (QID) | ORAL | Status: DC | PRN
Start: 2015-11-15 — End: 2015-11-15

## 2015-11-15 MED ORDER — ONDANSETRON HCL 4 MG/2ML IJ SOLN: 4.0000 mg | Freq: Four times a day (QID) | INTRAMUSCULAR | Status: DC | PRN

## 2015-11-15 MED ORDER — HEPARIN SODIUM (PORCINE) 5000 UNIT/ML IJ SOLN: 5000.0000 [IU] | Freq: Three times a day (TID) | INTRAMUSCULAR | Status: DC

## 2015-11-15 MED ORDER — FLUTICASONE PROPIONATE 50 MCG/ACT NA SUSP
2.0000 | Freq: Every day | NASAL | Status: DC
Start: 2015-11-15 — End: 2015-11-15
  Filled 2015-11-15: qty 16

## 2015-11-15 MED ORDER — LEVOTHYROXINE SODIUM 50 MCG PO TABS
50.0000 ug | ORAL_TABLET | Freq: Every day | ORAL | Status: DC
  Administered 2015-11-15: 50 ug via ORAL
  Filled 2015-11-15: qty 1

## 2015-11-15 MED ORDER — METHOTREXATE 2.5 MG PO TABS
7.5000 mg | ORAL_TABLET | ORAL | Status: DC
Start: 2015-11-16 — End: 2015-11-15

## 2015-11-15 MED ORDER — DONEPEZIL HCL 10 MG PO TABS
10.0000 mg | ORAL_TABLET | Freq: Every day | ORAL | Status: DC
Start: 2015-11-15 — End: 2015-11-15

## 2015-11-15 MED ORDER — METOPROLOL SUCCINATE ER 25 MG PO TB24: 25.0000 mg | ORAL_TABLET | Freq: Every day | ORAL | Status: DC

## 2015-11-15 NOTE — ED Notes (Signed)
Per EMS, pt woke up with chest pain and took 2 NTG and 324 ASA with relief. EMS arrived to his home the first time, and pt refused transport, stating that his pain had resolved. Pt then laid down, had the same central, non-radiating pain chest pain, and called EMS again. Pt denies CP at this time. Pt also reports bilateral feet swelling.

## 2015-11-15 NOTE — H&P (Signed)
[facility] History and Physical  Robert Rowe [id number] DOB: 1918-09-08 DOA: 11/15/2015  Referring physician: ED PCP: Nyoka Cowden, MD   Chief Complaint: Chest discomfort  HPI:  Robert Rowe is a 81 year old with past medical history significant for hyperlipidemia, hiatal hernia, CKD stage III, TIA on Plavix, remote tobacco abuse (quit in [id number], who presents with intermittent chest pain. Symptoms initially started around 10:20 PM as he was getting ready to go to bed. Pain was located in the center of his chest and did not radiate. Has a history of esophageal spasm which he initially thought symptoms were related to her which she took a nitroglycerin. States symptom lasted anywhere from 30-40 minutes and as the nitroglycerin was not relieving symptoms immediately called EMS. By the time EMS arrived however reports chest pain had resolved and he lifted his EKG which showed frequent PVCs he refused transport at that time. By the time EMS had left chest pain restarted worse than previous he took another dose of nitroglycerin with some transient relief of symptoms then took 4 baby aspirin. At that point he decided to come into the hospital to be safe as he tried to call his cardiologist and his primary care doctor for both were out of town. He notes recent increased lower leg swelling for which she is taking 2 doses of Lasix which she uses when necessary for swelling in the last week. He relates the swelling to his meloxicam. Denies any nausea, abdominal pain, sweating, or fever. He did note that over the last week or so that he has been having some shortness of breath with exertion. Patient notes that he quit smoking September  of 1954. Patient's last echocardiogram was done in 12/2012 where his ejection fraction was 55-60% with grade 1 diastolic dysfunction.  Upon admission to the emergency department patient was noted to still be pain-free. Initial EKG showed some intermittent PVCs.  Initial troponin <0.00.  Review of Systems  Cardiovascular: Positive for chest pain.       Past Medical History  Diagnosis Date  . BENIGN PROSTATIC HYPERTROPHY 04/05/2007  . CHEST DISCOMFORT 04/25/2009  . GERD 12/03/2007  . HYPERLIPIDEMIA 04/05/2007  . Palpitations 02/12/2010  . PEDAL EDEMA 05/01/2008  . TRANSIENT ISCHEMIC ATTACK, HX OF 04/05/2007    ?   Marland Kitchen Hiatal hernia   . Hypothyroidism   . Dysrhythmia     pvc's   . Chronic kidney disease     chronic   . Osteoarth NOS-Unspec 04/05/2007  . Rheumatoid arthritis(714.0) 04/05/2007  . DJD (degenerative joint disease)   . 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 Hemorrhoid surgery    . Hernia repair      inguinal x5  . Cataract extraction  2001    bilateral  . Knee surgery  arthroscopic    left  . Shoulder surgery  few years ago    left  . Prostate cryoablation      2000  . Esophagogastroduodenoscopy N/A 03/12/2013    Procedure: ESOPHAGOGASTRODUODENOSCOPY (EGD);  Surgeon: Inda Castle, MD;  Location: [facility];  Service: Endoscopy;  Laterality: N/A;  . Balloon dilation N/A 03/12/2013    Procedure: BALLOON DILATION;  Surgeon: Inda Castle, MD;  Location: [facility];  Service: Endoscopy;  Laterality: N/A;  . Transurethral resection of prostate N/A 03/19/2015    Procedure: TRANSURETHRAL RESECTION OF THE PROSTATE (TURP);  Surgeon: Carolan Clines, MD;  Location: [facility];  Service: Urology;  Laterality: N/A;  . Cystoscopy with urethral dilatation N/A 09/24/2015    Procedure: CYSTOSCOPY WITH COOK BALLOON DILATATION OF URETHRAL STRICTURE ;  Surgeon: Carolan Clines, MD;  Location: [facility];  Service: Urology;  Laterality: N/A;      Social History:  reports that he quit smoking about 62 years ago. He has never used smokeless tobacco. He reports that he drinks about 0.6 oz of alcohol per week. He reports that he does not use illicit drugs. Where does  patient live--home and with whom if at home?  Wife Can patient participate in ADLs? Yes  Allergies  Allergen Reactions  . Sulfonamide Derivatives     Patient does not remember allergy symptoms    Family History  Problem Relation Age of Onset  . Breast cancer Mother   . Lymphoma Brother   . Heart disease Father   . Heart attack Father   . Stroke Neg Hx   . Hypertension Neg Hx       Prior to Admission medications   Medication Sig Start Date End Date Taking? Authorizing Provider  clopidogrel (PLAVIX) 75 MG tablet Take 1 tablet (75 mg total) by mouth See admin instructions. Takes daily every day EXCEPT Sundays and Wednesdays. 11/05/15   Marletta Lor, MD  donepezil (ARICEPT) 10 MG tablet Take 1 tablet (10 mg total) by mouth daily. 11/11/15   Cameron Sprang, MD  fluticasone (FLONASE) 50 MCG/ACT nasal spray Place 2 sprays into both nostrils daily. 11/12/15   Marletta Lor, MD  hydrocortisone 2.5 % cream APPLY TO AFFECTED AREA  AT BEDTIME. 06/27/15   Historical Provider, MD  levothyroxine (SYNTHROID, LEVOTHROID) 50 MCG tablet TAKE 1 TABLET (50 MCG TOTAL) BY MOUTH DAILY. 09/22/15   Marletta Lor, MD  meloxicam (MOBIC) 7.5 MG tablet Take 7.5 mg by mouth daily.    Historical Provider, MD  methotrexate (RHEUMATREX) 2.5 MG tablet Take 3 tablets (7.5 mg total) by mouth once a week. Take 7.5 mg once a week on Mondays 11/03/15   Marletta Lor, MD  metoprolol succinate (TOPROL-XL) 25 MG 24 hr tablet Take 1 tablet (25 mg total) by mouth at bedtime. 08/11/15   Darlin Coco, MD  nitroGLYCERIN (NITROSTAT) 0.4 MG SL tablet Place 1 tablet (0.4 mg total) under the tongue every 5 (five) minutes as needed. 03/10/15   Marletta Lor, MD  pantoprazole (PROTONIX) 40 MG tablet Take 1 tablet (40 mg total) by mouth daily. 04/13/15   Marletta Lor, MD     Physical Exam: Filed Vitals:   11/15/15 0230 11/15/15 0245 11/15/15 0300 11/15/15 0352  BP: 106/58 104/53 102/62 159/84   Pulse: 82 77 75 79  Temp:    97.8 F (36.6 C)  TempSrc:    Oral  Resp: 20 17 14 16   Height:   5\' 2"  (1.575 m)   Weight:   68.312 kg (150 lb 9.6 oz)   SpO2: 96% 95% 98% 99%     Constitutional: Vital signs reviewed. Patient is a elderly male that appears rather than stated age well-developed and well-nourished in no acute distress and cooperative with exam. Alert and oriented x3.  Head: Normocephalic and atraumatic  Ear: TM normal bilaterally  Mouth: no erythema or exudates, MMM  Eyes: PERRL, EOMI, conjunctivae normal, No scleral icterus.  Neck: Supple, Trachea midline normal ROM, No JVD, mass, thyromegaly, or carotid bruit present.  Cardiovascular: RRR, S1 normal, S2 normal, no MRG, pulses symmetric and  intact bilaterally  Pulmonary/Chest: CTAB, no wheezes, rales, or rhonchi  Abdominal: Soft. Non-tender, non-distended, bowel sounds are normal, no masses, organomegaly, or guarding present.  GU: no CVA tenderness Musculoskeletal: No joint deformities, erythema, or stiffness, ROM full and no nontender Ext:  trace edemadema and no cyanosis, pulses palpable bilaterally (DP and PT)  Hematology: no cervical, inginal, or axillary adenopathy.  Neurological: A&O x3, Strenght is normal and symmetric bilaterally, cranial nerve II-XII are grossly intact, no focal motor deficit, sensory intact to light touch bilaterally.  Skin: Warm, dry and intact. No rash, cyanosis, or clubbing.  Psychiatric: Normal mood and affect. speech and behavior is normal. Judgment and thought content normal. Cognition and memory are normal.      Data Review   Micro Results No results found for this or any previous visit (from the past 240 hour(s)).  Radiology Reports Mr [name] Wo Contrast  10/19/2015  CLINICAL DATA:  81 year old male with episodes of confusion. Chronic kidney disease. Hyperlipidemia. Subsequent encounter. EXAM: MRA HEAD WITHOUT CONTRAST MRA NECK WITHOUT CONTRAST TECHNIQUE: Angiographic images of  the Circle of Willis were obtained using MRA technique without intravenous contrast. Angiographic images of the neck were obtained using MRA technique without intravenous contrast. Carotid stenosis measurements (when applicable) are obtained utilizing NASCET criteria, using the distal internal carotid diameter as the denominator. COMPARISON:  03/06/2015 brain MR. 01/09/2012 brain MR, MR angiogram circle of Willis and MR angiogram neck. FINDINGS: MRA HEAD FINDINGS High-grade stenosis right internal carotid artery cavernous/ supraclinoid junction. Aplastic A1 segment right anterior cerebral artery. Moderate narrowing right middle cerebral artery bifurcation. Moderate narrowing middle cerebral artery branches bilaterally. Moderate narrowing A2 segment right anterior cerebral artery. Moderate narrowing distal right vertebral artery. 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 Nonvisualized anterior inferior cerebellar arteries. Marked tandem stenosis posterior cerebral arteries bilaterally most notable proximally. No aneurysm visualized. MRA NECK FINDINGS Noncontrast imaging was performed secondary to patient's renal function. Limited evaluation proximal great vessels. Carotid bifurcation atherosclerotic type changes. Narrowing of the proximal internal carotid arteries bilaterally without hemodynamically significant stenosis on either side. 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 High-grade stenosis right internal carotid artery cavernous/ supraclinoid junction. Moderate narrowing right middle cerebral artery bifurcation. Moderate narrowing middle cerebral artery branches bilaterally. Moderate narrowing A2 segment right anterior cerebral artery. Moderate narrowing distal right vertebral artery. Nonvisualized anterior inferior cerebellar arteries. Marked tandem stenosis posterior  cerebral arteries bilaterally most notable proximally. MRA NECK Noncontrast imaging was performed secondary to patient's renal function. Limited evaluation proximal great vessels. Narrowing of the proximal internal carotid arteries bilaterally without hemodynamically significant stenosis on either side. Electronically Signed   By: Genia Del M.D.   On: 10/19/2015 19:40   Mr Angiogram Neck Wo Contrast  10/19/2015  CLINICAL DATA:  81 year old male with episodes of confusion. Chronic kidney disease. Hyperlipidemia. Subsequent encounter. EXAM: MRA HEAD WITHOUT CONTRAST MRA NECK WITHOUT CONTRAST TECHNIQUE: Angiographic images of the Circle of Willis were obtained using MRA technique without intravenous contrast. Angiographic images of the neck were obtained using MRA technique without intravenous contrast. Carotid stenosis measurements (when applicable) are obtained utilizing NASCET criteria, using the distal internal carotid diameter as the denominator. COMPARISON:  03/06/2015 brain MR. 01/09/2012 brain MR, MR angiogram circle of Willis and MR angiogram neck. FINDINGS: MRA HEAD FINDINGS High-grade stenosis right internal carotid artery cavernous/ supraclinoid junction. Aplastic A1 segment right anterior cerebral artery. Moderate narrowing right middle cerebral artery bifurcation.  Moderate narrowing middle cerebral artery branches bilaterally. Moderate narrowing A2 segment right anterior cerebral artery. Moderate narrowing distal right vertebral artery. 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 Nonvisualized anterior inferior cerebellar arteries. Marked tandem stenosis posterior cerebral arteries bilaterally most notable proximally. No aneurysm visualized. MRA NECK FINDINGS Noncontrast imaging was performed secondary to patient's renal function. Limited evaluation proximal great vessels. Carotid bifurcation atherosclerotic type changes. Narrowing of the proximal internal carotid  arteries bilaterally without hemodynamically significant stenosis on either side. 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 High-grade stenosis right internal carotid artery cavernous/ supraclinoid junction. Moderate narrowing right middle cerebral artery bifurcation. Moderate narrowing middle cerebral artery branches bilaterally. Moderate narrowing A2 segment right anterior cerebral artery. Moderate narrowing distal right vertebral artery. Nonvisualized anterior inferior cerebellar arteries. Marked tandem stenosis posterior cerebral arteries bilaterally most notable proximally. MRA NECK Noncontrast imaging was performed secondary to patient's renal function. Limited evaluation proximal great vessels. Narrowing of the proximal internal carotid arteries bilaterally without hemodynamically significant stenosis on either side. Electronically Signed   By: Genia Del M.D.   On: 10/19/2015 19:40   Dg Chest Portable 1 View  11/15/2015  CLINICAL DATA:  81 year old male with mid chest pain EXAM: PORTABLE CHEST 1 VIEW COMPARISON:  Radiograph dated 06/03/2013 FINDINGS: Single-view of the chest demonstrate subsegmental atelectatic changes of the left lung base. Pneumonia is less likely but not excluded. There is no focal consolidation, pleural effusion, or pneumothorax. Stable cardiomegaly. There is degenerative changes of the shoulders. No acute fracture. IMPRESSION: No active disease. Electronically Signed   By: Anner Crete M.D.   On: 11/15/2015 01:09     CBC  Recent Labs Lab 11/15/15 0046  WBC 7.1  HGB 14.0  HCT 41.6  PLT 218  MCV 99.3  MCH 33.4  MCHC 33.7  RDW 13.6    Chemistries   Recent Labs Lab 11/15/15 0046  NA 140  K 4.2  CL 104  CO2 28  GLUCOSE 169*  BUN 22*  CREATININE 1.69*  CALCIUM 9.4    ------------------------------------------------------------------------------------------------------------------ estimated creatinine clearance is 22.2 mL/min (by C-G formula based on Cr of 1.69). ------------------------------------------------------------------------------------------------------------------ No results for input(s): HGBA1C in the last 72 hours. ------------------------------------------------------------------------------------------------------------------ No results for input(s): CHOL, HDL, LDLCALC, TRIG, CHOLHDL, LDLDIRECT in the last 72 hours. ------------------------------------------------------------------------------------------------------------------ No results for input(s): TSH, T4TOTAL, T3FREE, THYROIDAB in the last 72 hours.  Invalid input(s): FREET3 ------------------------------------------------------------------------------------------------------------------ No results for input(s): VITAMINB12, FOLATE, FERRITIN, TIBC, IRON, RETICCTPCT in the last 72 hours.  Coagulation profile No results for input(s): INR, PROTIME in the last 168 hours.  No results for input(s): DDIMER in the last 72 hours.  Cardiac Enzymes  Recent Labs Lab 11/15/15 0046  TROPONINI <0.03   ------------------------------------------------------------------------------------------------------------------ Invalid input(s): POCBNP   CBG: No results for input(s): GLUCAP in the last 168 hours.     EKG: Independently reviewed. Intermittent PVCs Assessment/Plan     Chest pain: Patient reporting chest discomfort that appeared to be relieved with nitroglycerin. Patient is on EKG stating that there were frequent PVCs Admit to a telemetry bed for observation - Trend troponin's 3 - Repeat EKG in a.m. - Echocardiogram in a.m. - Consult cardiology for need of stress tests, patient NPO - Check magnesium level lipid panel  Hypothyroidism: Last TSH in 06/2015 noted to be  2.41. - Continue levothyroxine    Chronic kidney disease. Patient's appears close to baseline creatinine of 1.4-1.6  Rheumatoid arthritis  -  Held  mobic  GERD -Continue Protonix  Code Status:   full Family Communication: bedside Disposition Plan: admit   Total time spent 55 minutes.Greater than 50% of this time was spent in counseling, explanation of diagnosis, planning of further management, and coordination of care  [facility] Pager [phone]  If 7PM-7AM, please contact night-coverage [email or web address] Password [facility] 11/15/2015, 3:56 AM

## 2015-11-15 NOTE — ED Notes (Signed)
Labs given to family per Dr Leonides Schanz.  Family went home and patient resting.

## 2015-11-15 NOTE — ED Provider Notes (Signed)
By signing my name below, I, Robert Rowe, attest that this documentation has been prepared under the direction and in the presence of [place], DO. Electronically Signed: Altamease Rowe, ED Scribe. 11/15/2015. 2:53 AM.  TIME SEEN: 1:25 AM  CHIEF COMPLAINT: Chest pain  HPI: Robert Rowe is a 81 y.o. male with history of HLD, CKD, TIA on Plavix, and GERD who presents to the Emergency Department with his family complaining of new, intermittent, non-radiating, central chest pain with onset around 10:20 PM last night. The episodes last for 30-40 minutes at a time.  Pt states that he used one of the NTG that he keeps for esophageal spasm without significant relief earlier in the evening. At that time he called EMS but refused transport because the pain improved after their arrival. Soon after, the pain returned and he took another dose that provided some transient relief. He took four 81 mg aspirin at home. Pt is pain free at present. Associated symptoms include SOB for the last several days, worse with exertion. He states that he has some LE swelling that he associates with his arthritis medication. Pt denies nausea, vomiting, dizziness, sweating, recent fever, cough, abdominal pain, and back pain. His last stress test was 2 years ago. Pt denies cardiac history. No history of DVT/PE, HTN, or DM. He quit smoking in 1954.    ROS: See HPI Constitutional: no fever  Eyes: no drainage  ENT: no runny nose   Cardiovascular: chest pain  Resp: SOB  GI: no vomiting GU: no dysuria Integumentary: no rash  Allergy: no hives  Musculoskeletal: leg swelling  Neurological: no slurred speech ROS otherwise negative  PAST MEDICAL HISTORY/PAST SURGICAL HISTORY:  Past Medical History  Diagnosis Date  . BENIGN PROSTATIC HYPERTROPHY 04/05/2007  . CHEST DISCOMFORT 04/25/2009  . GERD 12/03/2007  . HYPERLIPIDEMIA 04/05/2007  . Palpitations 02/12/2010  . PEDAL EDEMA 05/01/2008  . TRANSIENT ISCHEMIC ATTACK, HX  OF 04/05/2007    ?   Marland Kitchen Hiatal hernia   . Hypothyroidism   . Dysrhythmia     pvc's   . Chronic kidney disease     chronic   . Osteoarth NOS-Unspec 04/05/2007  . Rheumatoid arthritis(714.0) 04/05/2007  . DJD (degenerative joint disease)   . 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 Authorizing Provider  clopidogrel (PLAVIX) 75 MG tablet Take 1 tablet (75 mg total) by mouth See admin instructions. Takes daily every day EXCEPT Sundays and Wednesdays. 11/05/15   Marletta Lor, MD  donepezil (ARICEPT) 10 MG tablet Take 1 tablet (10 mg total) by mouth daily. 11/11/15   Cameron Sprang, MD  fluticasone (FLONASE) 50 MCG/ACT nasal spray Place 2 sprays into both nostrils daily. 11/12/15   Marletta Lor, MD  hydrocortisone 2.5 % cream APPLY TO AFFECTED AREA  AT BEDTIME. 06/27/15   Historical Provider, MD  levothyroxine (SYNTHROID, LEVOTHROID) 50 MCG tablet TAKE 1 TABLET (50 MCG TOTAL) BY MOUTH DAILY. 09/22/15   Marletta Lor, MD  meloxicam (MOBIC) 7.5 MG tablet Take 7.5 mg by mouth daily.    Historical Provider, MD  methotrexate (RHEUMATREX) 2.5 MG tablet Take 3 tablets (7.5 mg total) by mouth once a week. Take 7.5 mg once a week on Mondays 11/03/15   Marletta Lor, MD  metoprolol succinate (TOPROL-XL) 25 MG 24 hr tablet Take 1 tablet (25 mg total) by  mouth at bedtime. 08/11/15   Darlin Coco, MD  nitroGLYCERIN (NITROSTAT) 0.4 MG SL tablet Place 1 tablet (0.4 mg total) under the tongue every 5 (five) minutes as needed. 03/10/15   Marletta Lor, MD  pantoprazole (PROTONIX) 40 MG tablet Take 1 tablet (40 mg total) by mouth daily. 04/13/15   Marletta Lor, MD    ALLERGIES:  Allergies  Allergen Reactions  . Sulfonamide Derivatives     Patient does not remember allergy symptoms    SOCIAL HISTORY:  Social History  Substance Use Topics  . Smoking status:  Former Smoker    Quit date: 08/13/1953  . Smokeless tobacco: Never Used  . Alcohol Use: 0.6 oz/week    1 Shots of liquor per week     Comment: patient has given up alcohol    FAMILY HISTORY: Family History  Problem Relation Age of Onset  . Breast cancer Mother   . Lymphoma Brother   . Heart disease Father   . Heart attack Father   . Stroke Neg Hx   . Hypertension Neg Hx     EXAM: BP 106/58 mmHg  Pulse 82  Resp 20  SpO2 96% CONSTITUTIONAL: Elderly, Alert and oriented and responds appropriately to questions. Well-appearing; well-nourished HEAD: Normocephalic EYES: Conjunctivae clear, PERRL ENT: normal nose; no rhinorrhea; moist mucous membranes; pharynx without lesions noted NECK: Supple, no meningismus, no LAD  CARD: RRR; S1 and S2 appreciated; no murmurs, no clicks, no rubs, no gallops RESP: Normal chest excursion without splinting or tachypnea; breath sounds clear and equal bilaterally; no wheezes, no rhonchi, no rales, no hypoxia or respiratory distress, speaking full sentences ABD/GI: Normal bowel sounds; non-distended; soft, non-tender, no rebound, no guarding, no peritoneal signs BACK:  The back appears normal and is non-tender to palpation, there is no CVA tenderness EXT: Normal ROM in all joints; non-tender to palpation; no edema; normal capillary refill; no cyanosis, no calf tenderness or swelling    SKIN: Normal color for age and race; warm NEURO: Moves all extremities equally, sensation to light touch intact diffusely, cranial nerves II through XII intact PSYCH: The patient's mood and manner are appropriate. Grooming and personal hygiene are appropriate.  MEDICAL DECISION MAKING: Patient here with chest pain, shortness of breath with exertion. Hemodynamically stable and chest pain-free after nitroglycerin. EKG shows no ischemia. We'll obtain cardiac labs, chest x-ray. Anticipate admission. Doubt PE or dissection given he is pain-free currently. He does have risk  factors for ACS.  ED PROGRESS: Patient's labs are unremarkable. He does have chronic kidney disease which is unchanged. Troponin is negative. Chest x-ray is clear. Recommended admission and patient agrees.    2:51 AM-Consult complete with Dr. Tamala Julian (Hospitalist). Patient case explained and discussed. Agrees to admit patient, telemetry observation, for further evaluation and treatment.  I will place holding orders per his request.   Date: 11/15/2015 0:22 AM  Rate: 88  Rhythm: normal sinus rhythm with PVCs and PACs  QRS Axis: normal  Intervals: normal  ST/T Wave abnormalities: normal  Conduction Disutrbances: none  Narrative Interpretation: Normal sinus rhythm with PVCs, PACs, prolonged QTC (484 ms)         I personally performed the services described in this documentation, which was scribed in my presence. The recorded information has been reviewed and is accurate.      [place], DO 11/15/15 [phone]

## 2015-11-15 NOTE — Discharge Summary (Signed)
Physician Discharge Summary  Robert Rowe MRN: [id number] DOB/AGE: 08/10/1918 81 y.o.  PCP: Robert Cowden, MD   Admit date: 11/15/2015 Discharge date: 11/15/2015  Discharge Diagnoses:     Principal Problem: Atypical chest pain likely secondary to esophageal disorder Active Problems:   Dyslipidemia   GERD   Hypothyroidism   Pain in the chest    Follow-up recommendations Follow-up with PCP in 3-5 days , including all  additional recommended appointments as below Follow-up CBC, CMP in 3-5 days      Medication List    STOP taking these medications        meloxicam 7.5 MG tablet  Commonly known as:  MOBIC      TAKE these medications        clopidogrel 75 MG tablet  Commonly known as:  PLAVIX  Take 1 tablet (75 mg total) by mouth See admin instructions. Takes daily every day EXCEPT Sundays and Wednesdays.     donepezil 10 MG tablet  Commonly known as:  ARICEPT  Take 1 tablet (10 mg total) by mouth daily.     fluticasone 50 MCG/ACT nasal spray  Commonly known as:  FLONASE  Place 2 sprays into both nostrils daily.     hydrocortisone 2.5 % cream  APPLY TO AFFECTED AREA  AT BEDTIME.     levothyroxine 50 MCG tablet  Commonly known as:  SYNTHROID, LEVOTHROID  TAKE 1 TABLET (50 MCG TOTAL) BY MOUTH DAILY.     methotrexate 2.5 MG tablet  Commonly known as:  RHEUMATREX  Take 3 tablets (7.5 mg total) by mouth once a week. Take 7.5 mg once a week on Mondays     metoprolol succinate 25 MG 24 hr tablet  Commonly known as:  TOPROL-XL  Take 1 tablet (25 mg total) by mouth at bedtime.     nitroGLYCERIN 0.4 MG SL tablet  Commonly known as:  NITROSTAT  Place 1 tablet (0.4 mg total) under the tongue every 5 (five) minutes as needed.     pantoprazole 40 MG tablet  Commonly known as:  PROTONIX  Take 1 tablet (40 mg total) by mouth daily.         Discharge Condition: Stable  Discharge Instructions       Discharge Instructions    Diet - low sodium  heart healthy    Complete by:  As directed      Increase activity slowly    Complete by:  As directed            Allergies  Allergen Reactions  . Sulfonamide Derivatives     Patient does not remember allergy symptoms      Disposition: 01-Home or Self Care   Consults:  None  Significant Diagnostic Studies:  Mr Robert Rowe Wo Contrast  10/19/2015  CLINICAL DATA:  81 year old male with episodes of confusion. Chronic kidney disease. Hyperlipidemia. Subsequent encounter. EXAM: MRA HEAD WITHOUT CONTRAST MRA NECK WITHOUT CONTRAST TECHNIQUE: Angiographic images of the Circle of Willis were obtained using MRA technique without intravenous contrast. Angiographic images of the neck were obtained using MRA technique without intravenous contrast. Carotid stenosis measurements (when applicable) are obtained utilizing NASCET criteria, using the distal internal carotid diameter as the denominator. COMPARISON:  03/06/2015 brain MR. 01/09/2012 brain MR, MR angiogram circle of Willis and MR angiogram neck. FINDINGS: MRA HEAD FINDINGS High-grade stenosis right internal carotid artery cavernous/ supraclinoid junction. Aplastic A1 segment right anterior cerebral artery. Moderate narrowing right middle cerebral artery bifurcation. Moderate narrowing middle cerebral  artery branches bilaterally. Moderate narrowing A2 segment right anterior cerebral artery. Moderate narrowing distal right vertebral artery. 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 Nonvisualized anterior inferior cerebellar arteries. Marked tandem stenosis posterior cerebral arteries bilaterally most notable proximally. No aneurysm visualized. MRA NECK FINDINGS Noncontrast imaging was performed secondary to patient's renal function. Limited evaluation proximal great vessels. Carotid bifurcation atherosclerotic type changes. Narrowing of the proximal internal carotid arteries bilaterally without hemodynamically  significant stenosis on either side. 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 High-grade stenosis right internal carotid artery cavernous/ supraclinoid junction. Moderate narrowing right middle cerebral artery bifurcation. Moderate narrowing middle cerebral artery branches bilaterally. Moderate narrowing A2 segment right anterior cerebral artery. Moderate narrowing distal right vertebral artery. Nonvisualized anterior inferior cerebellar arteries. Marked tandem stenosis posterior cerebral arteries bilaterally most notable proximally. MRA NECK Noncontrast imaging was performed secondary to patient's renal function. Limited evaluation proximal great vessels. Narrowing of the proximal internal carotid arteries bilaterally without hemodynamically significant stenosis on either side. Electronically Signed   By: Genia Del M.D.   On: 10/19/2015 19:40   Mr Angiogram Neck Wo Contrast  10/19/2015  CLINICAL DATA:  81 year old male with episodes of confusion. Chronic kidney disease. Hyperlipidemia. Subsequent encounter. EXAM: MRA HEAD WITHOUT CONTRAST MRA NECK WITHOUT CONTRAST TECHNIQUE: Angiographic images of the Circle of Willis were obtained using MRA technique without intravenous contrast. Angiographic images of the neck were obtained using MRA technique without intravenous contrast. Carotid stenosis measurements (when applicable) are obtained utilizing NASCET criteria, using the distal internal carotid diameter as the denominator. COMPARISON:  03/06/2015 brain MR. 01/09/2012 brain MR, MR angiogram circle of Willis and MR angiogram neck. FINDINGS: MRA HEAD FINDINGS High-grade stenosis right internal carotid artery cavernous/ supraclinoid junction. Aplastic A1 segment right anterior cerebral artery. Moderate narrowing right middle cerebral artery bifurcation. Moderate narrowing middle cerebral artery branches  bilaterally. Moderate narrowing A2 segment right anterior cerebral artery. Moderate narrowing distal right vertebral artery. 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 Nonvisualized anterior inferior cerebellar arteries. Marked tandem stenosis posterior cerebral arteries bilaterally most notable proximally. No aneurysm visualized. MRA NECK FINDINGS Noncontrast imaging was performed secondary to patient's renal function. Limited evaluation proximal great vessels. Carotid bifurcation atherosclerotic type changes. Narrowing of the proximal internal carotid arteries bilaterally without hemodynamically significant stenosis on either side. 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 High-grade stenosis right internal carotid artery cavernous/ supraclinoid junction. Moderate narrowing right middle cerebral artery bifurcation. Moderate narrowing middle cerebral artery branches bilaterally. Moderate narrowing A2 segment right anterior cerebral artery. Moderate narrowing distal right vertebral artery. Nonvisualized anterior inferior cerebellar arteries. Marked tandem stenosis posterior cerebral arteries bilaterally most notable proximally. MRA NECK Noncontrast imaging was performed secondary to patient's renal function. Limited evaluation proximal great vessels. Narrowing of the proximal internal carotid arteries bilaterally without hemodynamically significant stenosis on either side. Electronically Signed   By: Genia Del M.D.   On: 10/19/2015 19:40   Dg Chest Portable 1 View  11/15/2015  CLINICAL DATA:  81 year old male with mid chest pain EXAM: PORTABLE CHEST 1 VIEW COMPARISON:  Radiograph dated 06/03/2013 FINDINGS: Single-view of the chest demonstrate subsegmental atelectatic changes of the left lung base. Pneumonia is less likely but not excluded. There is no focal  consolidation, pleural effusion, or pneumothorax. Stable cardiomegaly. There is degenerative changes of the shoulders. No acute fracture. IMPRESSION: No active disease. Electronically Signed  By: Anner Crete M.D.   On: 11/15/2015 01:09     2-D echo refused by the patient   Filed Weights   11/15/15 0300  Weight: 68.312 kg (150 lb 9.6 oz)     Microbiology: No results found for this or any previous visit (from the past 240 hour(s)).     Blood Culture No results found for: SDES, [place], CULT, REPTSTATUS    Labs: Results for orders placed or performed during the hospital encounter of 11/15/15 (from the past 48 hour(s))  Basic metabolic panel     Status: Abnormal   Collection Time: 11/15/15 12:46 AM  Result Value Ref Range   Sodium 140 135 - 145 mmol/L   Potassium 4.2 3.5 - 5.1 mmol/L   Chloride 104 101 - 111 mmol/L   CO2 28 22 - 32 mmol/L   Glucose, Bld 169 (H) 65 - 99 mg/dL   BUN 22 (H) 6 - 20 mg/dL   Creatinine, Ser 1.69 (H) 0.61 - 1.24 mg/dL   Calcium 9.4 8.9 - 10.3 mg/dL   GFR calc non Af Amer 33 (L) >60 mL/min   GFR calc Af Amer 38 (L) >60 mL/min    Comment: (NOTE) The eGFR has been calculated using the CKD EPI equation. This calculation has not been validated in all clinical situations. eGFR's persistently <60 mL/min signify possible Chronic Kidney Disease.    Anion gap 8 5 - 15  CBC     Status: Abnormal   Collection Time: 11/15/15 12:46 AM  Result Value Ref Range   WBC 7.1 4.0 - 10.5 K/uL   RBC 4.19 (L) 4.22 - 5.81 MIL/uL   Hemoglobin 14.0 13.0 - 17.0 g/dL   HCT 41.6 39.0 - 52.0 %   MCV 99.3 78.0 - 100.0 fL   MCH 33.4 26.0 - 34.0 pg   MCHC 33.7 30.0 - 36.0 g/dL   RDW 13.6 11.5 - 15.5 %   Platelets 218 150 - 400 K/uL  Troponin I     Status: None   Collection Time: 11/15/15 12:46 AM  Result Value Ref Range   Troponin I <0.03 <0.031 ng/mL    Comment:        NO INDICATION OF MYOCARDIAL INJURY.   Troponin I-serum (0, 3, 6 hours)     Status:  None   Collection Time: 11/15/15  4:20 AM  Result Value Ref Range   Troponin I <0.03 <0.031 ng/mL    Comment:        NO INDICATION OF MYOCARDIAL INJURY.   Lipid panel     Status: Abnormal   Collection Time: 11/15/15  4:21 AM  Result Value Ref Range   Cholesterol 219 (H) 0 - 200 mg/dL   Triglycerides 349 (H) <150 mg/dL   HDL 33 (L) >40 mg/dL   Total CHOL/HDL Ratio 6.6 RATIO   VLDL 70 (H) 0 - 40 mg/dL   LDL Cholesterol 116 (H) 0 - 99 mg/dL    Comment:        Total Cholesterol/HDL:CHD Risk Coronary Heart Disease Risk Table                     Men   Women  1/2 Average Risk   3.4   3.3  Average Risk       5.0   4.4  2 X Average Risk   9.6   7.1  3 X Average Risk  23.4   11.0        Use  the calculated Patient Ratio above and the CHD Risk Table to determine the patient's CHD Risk.        ATP III CLASSIFICATION (LDL):  <100     mg/dL   Optimal  100-129  mg/dL   Near or Above                    Optimal  130-159  mg/dL   Borderline  160-189  mg/dL   High  >190     mg/dL   Very High   Troponin I-serum (0, 3, 6 hours)     Status: None   Collection Time: 11/15/15  7:33 AM  Result Value Ref Range   Troponin I <0.03 <0.031 ng/mL    Comment:        NO INDICATION OF MYOCARDIAL INJURY.   TSH     Status: None   Collection Time: 11/15/15  7:33 AM  Result Value Ref Range   TSH 3.014 0.350 - 4.500 uIU/mL  Magnesium     Status: None   Collection Time: 11/15/15  7:33 AM  Result Value Ref Range   Magnesium 1.9 1.7 - 2.4 mg/dL     Lipid Panel     Component Value Date/Time   CHOL 219* 11/15/2015 0421   TRIG 349* 11/15/2015 0421   HDL 33* 11/15/2015 0421   CHOLHDL 6.6 11/15/2015 0421   VLDL 70* 11/15/2015 0421   LDLCALC 116* 11/15/2015 0421     No results found for: HGBA1C   Lab Results  Component Value Date   LDLCALC 116* 11/15/2015   CREATININE 1.69* 11/15/2015     HPI :81 year old with past medical history significant for hyperlipidemia, hiatal hernia, CKD stage  III, TIA on Plavix, remote tobacco abuse (quit in [address], who presents with intermittent chest pain. Symptoms initially started around 10:20 PM as he was getting ready to go to bed. Pain was located in the center of his chest and did not radiate. Has a history of esophageal spasm which he initially thought symptoms were related to her which she took a nitroglycerin. States symptom lasted anywhere from 30-40 minutes and as the nitroglycerin was not relieving symptoms immediately called EMS. By the time EMS arrived however reports chest pain had resolved and he lifted his EKG which showed frequent PVCs he refused transport at that time. By the time EMS had left chest pain restarted worse than previous he took another dose of nitroglycerin with some transient relief of symptoms then took 4 baby aspirin. At that point he decided to come into the hospital to be safe as he tried to call his cardiologist and his primary care doctor for both were out of town. He notes recent increased lower leg swelling for which she is taking 2 doses of Lasix which she uses when necessary for swelling in the last week. He relates the swelling to his meloxicam. Denies any nausea, abdominal pain, sweating, or fever. He did note that over the last week or so that he has been having some shortness of breath with exertion. Patient notes that he quit smoking September of 1954. Patient's last echocardiogram was done in 12/2012 where his ejection fraction was 55-60% with grade 1 diastolic dysfunction.  Upon admission to the emergency department patient was noted to still be pain-free. Initial EKG showed some intermittent PVCs. Initial troponin <0.00.   HOSPITAL COURSE:   Chest pain: Patient reporting chest discomfort that appeared to be relieved with nitroglycerin and aspirin.  . EKG showed  changes in V1 and V2 V3 which were ST-T segment inversions in the setting of PVCs Telemetry uneventful, patient has been 3 Cardiac enzymes  negative Echocardiogram refused by the patient, patient very eager to leave today Reviewed EKG with Dr. Jeffie Pollock, subtle ST-T segment changes in the setting of PVCs, okay to discharge home from a cardiac standpoint  Hypothyroidism: Last TSH in 06/2015 noted to be 2.41. - Continue levothyroxine   Chronic kidney disease. Patient's appears close to baseline creatinine of 1.4-1.6  Rheumatoid arthritis  - Held mobic, likely contributing to esophageal spasms  GERD -Continue Protonix   Discharge Exam:    Blood pressure 159/84, pulse 79, temperature 97.8 F (36.6 C), temperature source Oral, resp. rate 16, height _0  (1.575 m), weight 68.312 kg (150 lb 9.6 oz), SpO2 99 %.  Constitutional: Vital signs reviewed. Patient is a elderly male that appears rather than stated age well-developed and well-nourished in no acute distress and cooperative with exam. Alert and oriented x3.  Head: Normocephalic and atraumatic  Ear: TM normal bilaterally  Mouth: no erythema or exudates, MMM  Eyes: PERRL, EOMI, conjunctivae normal, No scleral icterus.  Neck: Supple, Trachea midline normal ROM, No JVD, mass, thyromegaly, or carotid bruit present.  Cardiovascular: RRR, S1 normal, S2 normal, no MRG, pulses symmetric and intact bilaterally  Pulmonary/Chest: CTAB, no wheezes, rales, or rhonchi  Abdominal: Soft. Non-tender, non-distended, bowel sounds are normal, no masses, organomegaly, or guarding present.  GU: no CVA tenderness Musculoskeletal: No joint deformities, erythema, or stiffness, ROM full and no nontender Ext: trace edemadema and no cyanosis, pulses palpable bilaterally (DP and PT)  Hematology: no cervical, inginal, or axillary adenopathy.  Neurological: A&O x3, Strenght is normal and symmetric bilaterally, cranial nerve II-XII are grossly intact, no focal motor deficit, sensory intact to light touch bilaterally.  Skin: Warm, dry and intact. No rash, cyanosis, or clubbing.   Psychiatric: Normal mood and affect. speech and behavior is normal. Judgment and thought content normal. Cognition and memory are normal.     Follow-up Information    Schedule an appointment as soon as possible for a visit with Robert Cowden, MD.   Specialty:  Internal Medicine   Contact information:   [place] [address] [phone]       Follow up with Warren Danes, MD. Schedule an appointment as soon as possible for a visit in 1 week.   Specialty:  Cardiology   Contact information:   [address] [place] [address] [phone]       Signed: Reyne Dumas 11/15/2015, 10:10 AM        Time spent >45 mins

## 2015-11-17 ENCOUNTER — Telehealth: Payer: Self-pay | Admitting: Cardiology

## 2015-11-17 ENCOUNTER — Telehealth: Payer: Self-pay | Admitting: Internal Medicine

## 2015-11-17 NOTE — Telephone Encounter (Signed)
Spoke to pt, told him Dr. Raliegh Ip is out this week, need to contact Cardiology and schedule an appointment with them for this week. Pt verbalized understanding.

## 2015-11-17 NOTE — Telephone Encounter (Signed)
Spoke with patient and he has been having shortness of breath since last week Patient in hospital last week with chest pains for an overnight stay, advised to follow up with  Dr. [name] in 1 week.  Scheduled ov with Brynda Rim PA tomorrow, patient aware

## 2015-11-17 NOTE — Telephone Encounter (Signed)
New message     Pt c/o Shortness Of Breath: STAT if SOB developed within the last 24 hours or pt is noticeably SOB on the phone  1. Are you currently SOB (can you hear that pt is SOB on the phone)? Yes   2. How long have you been experiencing SOB? Since last Thursday   3. Are you SOB when sitting or when up moving around? Moving around   4. Are you currently experiencing any other symptoms? Discomfort on christmas eve. Call 911 went to [place] spend the night.

## 2015-11-17 NOTE — Telephone Encounter (Signed)
Patient is short of breath and thinks that he has heart problems. Was admitted to the hospital, Christmas Eve until Christmas Day. He was wondering if you need to see him or does he need to go see a cardiologist?

## 2015-11-17 NOTE — Progress Notes (Signed)
Cardiology Office Note    Date:  11/18/2015   ID:  Robert Rowe, DOB [birth year], MRN [id number]  PCP:  Nyoka Cowden, MD  Cardiologist:  Dr. Darlin Coco   Electrophysiologist:  n/a  Chief Complaint  Patient presents with  . 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 He has had numerous endoscopies and esophageal dilatations in the past. He does have a history of chest pain often triggered by alcohol ingestion or overeating. He had chest pain at [facility] in 2014. Stress test there was negative. Last seen by Dr. Mare Rowe 8/16.  Admitted 11/15/15 with chest pain. He had minimal relief with nitroglycerin at home. Cardiac enzymes remained negative. Telemetry is unremarkable. PVCs were noted. Patient refused echocardiogram.  Over the last several weeks, he has noted increasing shortness of breath as well as increasing LE edema. He denies orthopnea or PND. He is convinced that his chest symptoms were related to esophageal spasm on 12/25. He denies a history of exertional chest pain. He denies syncope. He denies significant cough. He denies any bleeding issues. Of note, his PCP recently obtain a BNP. This was mildly elevated at 160. The patient was able to obtain Lasix from his pharmacy. He took 1-2 doses with improvement in his breathing and edema.   Past Medical History  Diagnosis Date  . BENIGN PROSTATIC HYPERTROPHY 04/05/2007  . CHEST DISCOMFORT 04/25/2009  . GERD 12/03/2007  . HYPERLIPIDEMIA 04/05/2007  . Palpitations 02/12/2010  . PEDAL EDEMA 05/01/2008  . TRANSIENT ISCHEMIC ATTACK, HX OF 04/05/2007    ?   [facility]e Hiatal hernia   . Hypothyroidism   . Dysrhythmia     pvc's   . Chronic kidney disease     chronic   . Osteoarth NOS-Unspec 04/05/2007  . Rheumatoid arthritis(714.0) 04/05/2007  . DJD  (degenerative joint disease)   . 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 Chronic diastolic CHF (congestive heart failure) ([place])     a. Echo 12/16: mild LVH, EF 55-60%, no RWMA, Gr 1 DD, mild AI, MAC, mild LAE, normal RVF, PASP 37 mmHg    Past Surgical History  Procedure Laterality Date  . Hemorrhoid surgery    . Hernia repair      inguinal x5  . Cataract extraction  2001    bilateral  . Knee surgery  arthroscopic    left  . Shoulder surgery  few years ago    left  . Prostate cryoablation      2000  . Esophagogastroduodenoscopy N/A 03/12/2013    Procedure: ESOPHAGOGASTRODUODENOSCOPY (EGD);  Surgeon: Inda Castle, MD;  Location: [facility];  Service: Endoscopy;  Laterality: N/A;  . Balloon dilation N/A 03/12/2013    Procedure: BALLOON DILATION;  Surgeon: Inda Castle, MD;  Location: [facility];  Service: Endoscopy;  Laterality: N/A;  . Transurethral resection of prostate N/A 03/19/2015    Procedure: TRANSURETHRAL RESECTION OF THE PROSTATE (TURP);  Surgeon: Carolan Clines, MD;  Location: [facility];  Service: Urology;  Laterality: N/A;  . Cystoscopy with urethral dilatation N/A 09/24/2015    Procedure: CYSTOSCOPY WITH COOK BALLOON DILATATION OF URETHRAL STRICTURE ;  Surgeon: Carolan Clines, MD;  Location: [facility];  Service: Urology;  Laterality: N/A;    Current Outpatient Prescriptions  Medication Sig Dispense Refill  .  clopidogrel (PLAVIX) 75 MG tablet Take 75 mg by mouth daily.    Robert Rowe donepezil (ARICEPT) 10 MG tablet Take 1 tablet (10 mg total) by mouth daily. 90 tablet 1  . hydrocortisone 2.5 % cream APPLY TOPICALLY ONCE TO AFFECTED AREA  AT BEDTIME.  6  . levothyroxine (SYNTHROID, LEVOTHROID) 50 MCG tablet TAKE 1 TABLET (50 MCG TOTAL) BY MOUTH DAILY. 90 tablet 3  . methotrexate (RHEUMATREX) 2.5 MG tablet Take 3 tablets (7.5 mg total) by mouth once a week. Take 7.5 mg once a week on Mondays 12 tablet 0  . metoprolol succinate (TOPROL-XL) 25 MG 24 hr  tablet Take 1 tablet (25 mg total) by mouth at bedtime. 30 tablet 6  . nitroGLYCERIN (NITROSTAT) 0.4 MG SL tablet Place 0.4 mg under the tongue every 5 (five) minutes as needed (ESOPHEAGEAL SPASPMS).    . pantoprazole (PROTONIX) 40 MG tablet Take 1 tablet (40 mg total) by mouth daily. 90 tablet 3  . furosemide (LASIX) 20 MG tablet Take 1 tablet (20 mg total) by mouth as directed. Take 40 mg daily for 2 days then start 20 mg daily 60 tablet 11   No current facility-administered medications for this visit.    Allergies:   Sulfonamide derivatives   Social History   Social History  . Marital Status: Married    Spouse Name: N/A  . Number of Children: N/A  . Years of Education: N/A   Social History Main Topics  . Smoking status: Former Smoker    Quit date: 08/13/1953  . Smokeless tobacco: Never Used  . Alcohol Use: 0.6 oz/week    1 Shots of liquor per week     Comment: patient has given up alcohol  . Drug Use: No  . Sexual Activity: Not Asked   Other Topics Concern  . None   Social History Narrative     Family History:  The patient's family history includes Breast cancer in his mother; Heart attack in his father; Heart disease in his father; Lymphoma in his brother. There is no history of Stroke or Hypertension.   ROS:   Please see the history of present illness.    ROS All other systems reviewed and are negative.   PHYSICAL EXAM:   VS:  BP 140/80 mmHg  Pulse 91  Ht 5\' 2"  (1.575 m)  Wt 149 lb (67.586 kg)  BMI 27.25 kg/m2  SpO2 97%   GEN: Well nourished, well developed, in no acute distress HEENT: normal Neck: JVP 6-7 cm, no masses Cardiac: Normal S1/S2, RRR; no murmurs, rubs, or gallops, 1+ bilateral ankle edema   Respiratory:  clear to auscultation bilaterally; no wheezing, rhonchi or rales GI: soft, nontender, nondistended, + BS MS: no deformity or atrophy Skin: warm and dry, no rash Neuro:  Bilateral strength equal, no focal deficits  Psych: Alert and oriented x  3, normal affect  Wt Readings from Last 3 Encounters:  11/18/15 149 lb (67.586 kg)  11/15/15 150 lb 9.6 oz (68.312 kg)  11/09/15 150 lb 8 oz (68.266 kg)      Studies/Labs Reviewed:   EKG:  EKG is not ordered today.   The ekg from 11/15/15 done at [facility] demonstrated NSR, HR 88, normal axis, PVCs, QTc 44 ms, no change from prior tracings  Recent Labs: 06/22/2015: ALT 12 10/30/2015: Pro B Natriuretic peptide (BNP) 139.0* 11/15/2015: BUN 22*; Creatinine, Ser 1.69*; Hemoglobin 14.0; Magnesium 1.9; Platelets 218; Potassium 4.2; Sodium 140; TSH 3.014   Recent Lipid Panel  Component Value Date/Time   CHOL 219* 11/15/2015 0421   TRIG 349* 11/15/2015 0421   HDL 33* 11/15/2015 0421   CHOLHDL 6.6 11/15/2015 0421   VLDL 70* 11/15/2015 0421   LDLCALC 116* 11/15/2015 0421    Additional studies/ records that were reviewed today include:   Myoview 4/14 EF 57%, no scar or ischemia  Echo 2/14 Mild LVH, EF 55-60%, normal wall motion, grade 1 diastolic dysfunction, mild AI, MAC, mild MR, mild LAE, PASP 34 mmHg   ASSESSMENT:    1. Acute diastolic heart failure ([place])   2. CKD (chronic kidney disease), stage 3 (moderate)   3. Cerebrovascular disease   4. PVC's (premature ventricular contractions)   5. Gastroesophageal reflux disease, esophagitis presence not specified     PLAN:  In order of problems listed above:  1. Acute Diastolic CHF - He appears to be volume overloaded. He has had diastolic dysfunction noted on echocardiogram in the past. He was recently admitted to the hospital chest pain. Cardiac enzymes were negative and his symptoms are more consistent with esophageal spasm.  I suspect his dyspnea and edema are all related to diastolic dysfunction. Start Lasix 40 mg daily 2 days. Then continue Lasix 20 mg daily. Obtain echocardiogram. Repeat BMET one week. Close follow-up in 2-3 weeks. If he continues to be short of breath with improved volume, consider stress  testing.  2. CKD - I will keep a close eye on his renal function and potassium with initiation of diuretics.  3. Cerebrovascular disease - He has a history of TIA. He was recently seen by neurology and placed on Aricept for memory loss. MRA suggested significant vascular disease. He remains on Plavix.  4. PVCs  -  Largely asymptomatic. He remains on beta blocker therapy.  5. GERD - He remains on PPI. As noted, recent chest pain was likely related to esophageal spasm.     Medication Adjustments/Labs and Tests Ordered: Current medicines are reviewed at length with the patient today.  Concerns regarding medicines are outlined above.  Medication changes, Labs and Tests ordered today are listed below. Patient Instructions  Medication Instructions:  START LASIX 20 MG TABLET WITH THE DIRECTIONS OF 40 MG DAILY FOR 2 DAYS THEN DECREASE LASIX TO 20 MG DAILY  Labwork: BMET IN 1 WEEK  Testing/Procedures: Your physician has requested that you have an echocardiogram. Echocardiography is a painless test that uses sound waves to create images of your heart. It provides your doctor with information about the size and shape of your heart and how well your heart's chambers and valves are working. This procedure takes approximately one hour. There are no restrictions for this procedure.  Follow-Up: DR. Mare Rowe IN 2-3 WEEKS  Any Other Special Instructions Will Be Listed Below (If Applicable).  If you need a refill on your cardiac medications before your next appointment, please call your pharmacy.    Signed, Richardson Dopp, PA-C  11/18/2015 9:33 PM    [facility] [address], [place], [place]  [address] Phone: [phone]; Fax: [phone]

## 2015-11-18 ENCOUNTER — Encounter: Payer: Self-pay | Admitting: Physician Assistant

## 2015-11-18 ENCOUNTER — Other Ambulatory Visit: Payer: Self-pay

## 2015-11-18 ENCOUNTER — Ambulatory Visit (INDEPENDENT_AMBULATORY_CARE_PROVIDER_SITE_OTHER): Payer: Medicare Other | Admitting: Physician Assistant

## 2015-11-18 ENCOUNTER — Ambulatory Visit (HOSPITAL_COMMUNITY): Payer: Medicare Other | Attending: Cardiovascular Disease

## 2015-11-18 VITALS — BP 140/80 | HR 91 | Ht 62.0 in | Wt 149.0 lb

## 2015-11-18 DIAGNOSIS — I517 Cardiomegaly: Secondary | ICD-10-CM | POA: Diagnosis not present

## 2015-11-18 DIAGNOSIS — I493 Ventricular premature depolarization: Secondary | ICD-10-CM | POA: Diagnosis not present

## 2015-11-18 DIAGNOSIS — K219 Gastro-esophageal reflux disease without esophagitis: Secondary | ICD-10-CM

## 2015-11-18 DIAGNOSIS — I059 Rheumatic mitral valve disease, unspecified: Secondary | ICD-10-CM | POA: Insufficient documentation

## 2015-11-18 DIAGNOSIS — I679 Cerebrovascular disease, unspecified: Secondary | ICD-10-CM | POA: Diagnosis not present

## 2015-11-18 DIAGNOSIS — I5031 Acute diastolic (congestive) heart failure: Secondary | ICD-10-CM

## 2015-11-18 DIAGNOSIS — I351 Nonrheumatic aortic (valve) insufficiency: Secondary | ICD-10-CM | POA: Diagnosis not present

## 2015-11-18 DIAGNOSIS — I509 Heart failure, unspecified: Secondary | ICD-10-CM | POA: Diagnosis present

## 2015-11-18 DIAGNOSIS — I358 Other nonrheumatic aortic valve disorders: Secondary | ICD-10-CM | POA: Insufficient documentation

## 2015-11-18 DIAGNOSIS — E785 Hyperlipidemia, unspecified: Secondary | ICD-10-CM | POA: Insufficient documentation

## 2015-11-18 DIAGNOSIS — Z87891 Personal history of nicotine dependence: Secondary | ICD-10-CM | POA: Diagnosis not present

## 2015-11-18 DIAGNOSIS — N183 Chronic kidney disease, stage 3 (moderate): Secondary | ICD-10-CM

## 2015-11-18 MED ORDER — FUROSEMIDE 20 MG PO TABS: 20.0000 mg | ORAL_TABLET | ORAL | Status: DC

## 2015-11-18 NOTE — Patient Instructions (Addendum)
Medication Instructions:  START LASIX 20 MG TABLET WITH THE DIRECTIONS OF 40 MG DAILY FOR 2 DAYS THEN DECREASE LASIX TO 20 MG DAILY  Labwork: BMET IN 1 WEEK  Testing/Procedures: Your physician has requested that you have an echocardiogram. Echocardiography is a painless test that uses sound waves to create images of your heart. It provides your doctor with information about the size and shape of your heart and how well your heart's chambers and valves are working. This procedure takes approximately one hour. There are no restrictions for this procedure.  Follow-Up: DR. [name] IN 2-3 WEEKS  Any Other Special Instructions Will Be Listed Below (If Applicable).  If you need a refill on your cardiac medications before your next appointment, please call your pharmacy.

## 2015-11-19 ENCOUNTER — Telehealth: Payer: Self-pay | Admitting: Cardiology

## 2015-11-19 ENCOUNTER — Telehealth: Payer: Self-pay | Admitting: *Deleted

## 2015-11-19 NOTE — Telephone Encounter (Signed)
UNABLE TO LEAVE MESSAGE  VOICE MAIL BOX  NOT  SET UP .Adonis Housekeeper

## 2015-11-19 NOTE — Telephone Encounter (Signed)
Follow up   Pt called back to speak to rn he ask that you try both numbers

## 2015-11-19 NOTE — Telephone Encounter (Signed)
New Message  Pt called st that he has received the results via mychart but he doesn't understand the readings. Please call back to discuss.

## 2015-11-19 NOTE — Telephone Encounter (Signed)
Follow up     Patient calling wants an interpretation of his echo results. Please call back to discuss

## 2015-11-19 NOTE — Telephone Encounter (Signed)
Pt has been notified of echo results and finding by phone with verbal understanding to results given today. Pt said thank you for our help and time. He wanted to thank Richardson Dopp, PA for all of his time as well.

## 2015-11-19 NOTE — Telephone Encounter (Signed)
ECHO  RESULTS  DISCUSSED WITH  PT./CY

## 2015-11-24 ENCOUNTER — Telehealth: Payer: Self-pay | Admitting: Internal Medicine

## 2015-11-24 NOTE — Telephone Encounter (Signed)
Called and discussed

## 2015-11-24 NOTE — Telephone Encounter (Signed)
[name] called needing to talk to Dr. Burnice Logan about what he developed over the weekend.

## 2015-11-25 ENCOUNTER — Other Ambulatory Visit: Payer: Medicare Other

## 2015-11-25 ENCOUNTER — Telehealth: Payer: Self-pay | Admitting: Neurology

## 2015-11-25 NOTE — Telephone Encounter (Signed)
Pt states that he stop taking the Aricept due to the side effects he was having please call [phone]

## 2015-11-25 NOTE — Telephone Encounter (Signed)
Would just hold off on medication for now. Thanks

## 2015-11-25 NOTE — Telephone Encounter (Signed)
Patient states he stopped taking the Aricept due to upset stomach & vomiting. He stopped taking medication yesterday, states he was "miserable". States he is feeling better today.

## 2015-11-27 ENCOUNTER — Telehealth: Payer: Self-pay | Admitting: *Deleted

## 2015-11-27 ENCOUNTER — Other Ambulatory Visit (INDEPENDENT_AMBULATORY_CARE_PROVIDER_SITE_OTHER): Payer: Medicare Other | Admitting: *Deleted

## 2015-11-27 DIAGNOSIS — Z8679 Personal history of other diseases of the circulatory system: Secondary | ICD-10-CM

## 2015-11-27 DIAGNOSIS — I5031 Acute diastolic (congestive) heart failure: Secondary | ICD-10-CM | POA: Diagnosis not present

## 2015-11-27 LAB — BASIC METABOLIC PANEL
BUN: 22 mg/dL (ref 7–25)
CO2: 27 mmol/L (ref 20–31)
Calcium: 9 mg/dL (ref 8.6–10.3)
Chloride: 98 mmol/L (ref 98–110)
Creat: 1.82 mg/dL — ABNORMAL HIGH (ref 0.70–1.11)
Glucose, Bld: 128 mg/dL — ABNORMAL HIGH (ref 65–99)
Potassium: 3.9 mmol/L (ref 3.5–5.3)
Sodium: 135 mmol/L (ref 135–146)

## 2015-11-27 NOTE — Addendum Note (Signed)
Addended by: Eulis Foster on: 11/27/2015 09:51 AM   Modules accepted: Orders

## 2015-11-27 NOTE — Telephone Encounter (Signed)
Pt has been  notified of lab results and recommendations that were reviewed by DOD Dr. Murlean Caller in the office. Continue on current Tx plan, bmet 1/10. Pt agreeable to plan of care.

## 2015-11-30 ENCOUNTER — Telehealth: Payer: Self-pay | Admitting: Internal Medicine

## 2015-11-30 MED ORDER — METHOTREXATE 2.5 MG PO TABS: 7.5000 mg | ORAL_TABLET | ORAL | Status: DC

## 2015-11-30 NOTE — Telephone Encounter (Signed)
ok 

## 2015-11-30 NOTE — Telephone Encounter (Signed)
Called requesting a RX refill on  methotrexate (RHEUMATREX) 2.5 MG tablet

## 2015-11-30 NOTE — Telephone Encounter (Signed)
Rx sent to pharmacy   

## 2015-12-01 ENCOUNTER — Telehealth: Payer: Self-pay | Admitting: Physician Assistant

## 2015-12-01 NOTE — Telephone Encounter (Signed)
I returned call to pt from earlier. Lmptcb. Looks like pt was calling about his echo results, which I had gone over 12/28. I will be more than happy to review results agian with pt if this is what he is asking.

## 2015-12-01 NOTE — Telephone Encounter (Signed)
F/u ° ° °Pt returning your call °

## 2015-12-01 NOTE — Telephone Encounter (Signed)
New message      Pt request to talk to Robert Rowe to get an interpretation on his echo.

## 2015-12-01 NOTE — Telephone Encounter (Signed)
Pt had cb then I had tcb who had not remembered that we had gone over echo results and findings 12/28. Pt was greatful for me going back over results again, said his memory is not good anymore. Pt has appt 1/18 w/PA.

## 2015-12-02 DIAGNOSIS — M0609 Rheumatoid arthritis without rheumatoid factor, multiple sites: Secondary | ICD-10-CM | POA: Diagnosis not present

## 2015-12-02 DIAGNOSIS — M150 Primary generalized (osteo)arthritis: Secondary | ICD-10-CM | POA: Diagnosis not present

## 2015-12-07 NOTE — Progress Notes (Signed)
This encounter was created in error - please disregard.

## 2015-12-08 NOTE — Progress Notes (Signed)
Cardiology Office Note:    Date:  12/09/2015   ID:  Robert Rowe, DOB 1918-05-12, MRN [id number]  PCP:  Robert Cowden, MD  Cardiologist:  Robert Rowe. Robert Rowe   Electrophysiologist:  n/a  Chief Complaint  Patient presents with  . Congestive Heart Failure    Follow up from 11/18/15    History of Present Illness:    Robert Rowe. 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 He has had numerous endoscopies and esophageal dilatations in the past. He does have a history of chest pain often triggered by alcohol ingestion or overeating. He had chest pain at [facility] in 2014. Stress test there was negative. Last seen by Robert Rowe. Robert Rowe 8/16.  Admitted 11/15/15 with chest pain. He had minimal relief with nitroglycerin at home. Cardiac enzymes remained negative. Telemetry was unremarkable. PVCs were noted. Patient refused echocardiogram.  I saw him 12/28 with complaints of increasing shortness of breath as well as increasing LE edema.  He had received Lasix from his pharmacy and took a couple doses with improvement in his breathing and edema. I continued him on Lasix QD.  Echo demonstrated normal LVF, mild diastolic dysfunction, mild to mod pulmonary HTN.  He returns for FU.    He still has some edema in his ankles and increased DOE.  He has lost 4 lbs since starting the Lasix. He denies any further chest pain.  Denies orthopnea, PND.  He denies syncope.  He does note significant fatigue.  He thinks it may have gotten worse when he was taken off of Meloxicam by his rheumatologist.  He also had a lot of nausea with Aricept.  He has since stopped this medication.    Past Medical History  Diagnosis Date  . BENIGN PROSTATIC HYPERTROPHY 04/05/2007  . CHEST DISCOMFORT 04/25/2009  . GERD 12/03/2007  . HYPERLIPIDEMIA 04/05/2007  . Palpitations 02/12/2010  . PEDAL EDEMA 05/01/2008  .  TRANSIENT ISCHEMIC ATTACK, HX OF 04/05/2007    ?   Marland Kitchen Hiatal hernia   . Hypothyroidism   . Dysrhythmia     pvc's   . Chronic kidney disease     chronic   . Osteoarth NOS-Unspec 04/05/2007  . Rheumatoid arthritis(714.0) 04/05/2007  . DJD (degenerative joint disease)   . 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 Chronic diastolic CHF (congestive heart failure) ([place])     a. Echo 12/16: mild LVH, EF 55-60%, no RWMA, Gr 1 DD, mild AI, MAC, mild LAE, normal RVF, PASP 37 mmHg    Past Surgical History  Procedure Laterality Date  . Hemorrhoid surgery    . Hernia repair      inguinal x5  . Cataract extraction  2001    bilateral  . Knee surgery  arthroscopic    left  . Shoulder surgery  few years ago    left  . Prostate cryoablation      2000  . Esophagogastroduodenoscopy N/A 03/12/2013    Procedure: ESOPHAGOGASTRODUODENOSCOPY (EGD);  Surgeon: Robert Castle, MD;  [facility];  Service: Endoscopy;  Laterality: N/A;  . Balloon dilation N/A 03/12/2013    Procedure: BALLOON DILATION;  Surgeon: Robert Castle, MD;  [facility]: [facility];  Service: Endoscopy;  Laterality: N/A;  . Transurethral resection of prostate N/A 03/19/2015    Procedure: TRANSURETHRAL RESECTION OF THE PROSTATE (TURP);  Surgeon: Robert Rowe  Robert Arabian, MD;  Location: [facility];  Service: Urology;  Laterality: N/A;  . Cystoscopy with urethral dilatation N/A 09/24/2015    Procedure: CYSTOSCOPY WITH COOK BALLOON DILATATION OF URETHRAL STRICTURE ;  Surgeon: Robert Clines, MD;  Location: [facility];  Service: Urology;  Laterality: N/A;    Current Medications: Outpatient Prescriptions Prior to Visit  Medication Sig Dispense Refill  . clopidogrel (PLAVIX) 75 MG tablet Take 75 mg by mouth daily.    . hydrocortisone 2.5 % cream APPLY TOPICALLY ONCE TO AFFECTED AREA  AT BEDTIME.  6  . levothyroxine (SYNTHROID, LEVOTHROID) 50 MCG tablet TAKE 1 TABLET (50 MCG TOTAL) BY MOUTH DAILY. 90 tablet 3  . methotrexate  (RHEUMATREX) 2.5 MG tablet Take 3 tablets (7.5 mg total) by mouth once a week. Take 7.5 mg once a week on Mondays 12 tablet 1  . metoprolol succinate (TOPROL-XL) 25 MG 24 hr tablet Take 1 tablet (25 mg total) by mouth at bedtime. 30 tablet 6  . nitroGLYCERIN (NITROSTAT) 0.4 MG SL tablet Place 0.4 mg under the tongue every 5 (five) minutes as needed (ESOPHEAGEAL SPASPMS).    . pantoprazole (PROTONIX) 40 MG tablet Take 1 tablet (40 mg total) by mouth daily. 90 tablet 3  . donepezil (ARICEPT) 10 MG tablet Take 1 tablet (10 mg total) by mouth daily. (Patient not taking: Reported on 12/09/2015) 90 tablet 1  . furosemide (LASIX) 20 MG tablet Take 1 tablet (20 mg total) by mouth as directed. Take 40 mg daily for 2 days then start 20 mg daily (Patient not taking: Reported on 12/09/2015) 60 tablet 11   No facility-administered medications prior to visit.     Allergies:   Sulfonamide derivatives   Social History   Social History  . Marital Status: Married    Spouse Name: N/A  . Number of Children: N/A  . Years of Education: N/A   Social History Main Topics  . Smoking status: Former Smoker    Quit date: 08/13/1953  . Smokeless tobacco: Never Used  . Alcohol Use: 0.6 oz/week    1 Shots of liquor per week     Comment: patient has given up alcohol  . Drug Use: No  . Sexual Activity: Not Asked   Other Topics Concern  . None   Social History Narrative     Family History:  The patient's family history includes Breast cancer in his mother; Heart attack in his father; Heart disease in his father; Lymphoma in his brother. There is no history of Stroke or Hypertension.   ROS:   Please see the history of present illness.    Review of Systems  Constitution: Positive for malaise/fatigue.  Cardiovascular: Positive for dyspnea on exertion.  All other systems reviewed and are negative.   Physical Exam:    VS:  BP 130/72 mmHg  Pulse 78  Ht 5\' 2"  (1.575 m)  Wt 146 lb 12.8 oz (66.588 kg)  BMI  26.84 kg/m2   GEN: Well nourished, well developed, in no acute distress HEENT: normal Neck: no JVD, no masses Cardiac: Normal S1/S2,  RRR; no murmurs;  1-2+ bilateral ankle edema;    Respiratory:  clear to auscultation bilaterally; no wheezing, rhonchi or rales GI: soft, nontender, nondistended  MS: no deformity or atrophy Skin: warm and dry, no rash Neuro:   no focal deficits  Psych: Alert and oriented x 3, normal affect  Wt Readings from Last 3 Encounters:  12/09/15 146 lb 12.8 oz (66.588 kg)  11/18/15 149 lb (  67.586 kg)  11/15/15 150 lb 9.6 oz (68.312 kg)      Studies/Labs Reviewed:    EKG:  EKG is not ordered today.  The ekg ordered today demonstrates n/a  Recent Labs: 06/22/2015: ALT 12 10/30/2015: Pro B Natriuretic peptide (BNP) 139.0* 11/15/2015: Hemoglobin 14.0; Magnesium 1.9; Platelets 218; TSH 3.014 11/27/2015: BUN 22; Creat 1.82*; Potassium 3.9; Sodium 135   Recent Lipid Panel    Component Value Date/Time   CHOL 219* 11/15/2015 0421   TRIG 349* 11/15/2015 0421   HDL 33* 11/15/2015 0421   CHOLHDL 6.6 11/15/2015 0421   VLDL 70* 11/15/2015 0421   LDLCALC 116* 11/15/2015 0421    Additional studies/ records that were reviewed today include:   Echo 11/18/15 Mild LVH, EF 55-60%, normal wall motion, grade 1 diastolic dysfunction, mild AI, MAC, mild LAE, normal RVSF, PASP 37 mmHg  Myoview 4/14 EF 57%, no scar or ischemia  Echo 2/14 Mild LVH, EF 55-60%, normal wall motion, grade 1 diastolic dysfunction, mild AI, MAC, mild MR, mild LAE, PASP 34 mmHg   ASSESSMENT:    1. Shortness of breath   2. Chronic diastolic CHF (congestive heart failure) ([place])   3. CKD (chronic kidney disease), stage 3 (moderate)   4. Frequent PVCs   5. Other fatigue   6. Rheumatoid arthritis, involving unspecified site, unspecified rheumatoid factor presence ([place])     PLAN:    In order of problems listed above:  1. Dyspnea - He remains short of breath and he complains of significant  fatigue.  His symptoms are likely multifactorial and related to discontinuation of Meloxicam for his RA, deconditioning, CHF, obesity.  However, he had an admission last month for chest pain.  He felt this was more c/w his esophageal spasm.  It has been 3 years since his last assessment for ischemia.     -  Arrange Lexiscan Myoview  -  Adjust diuretics further  -  If symptoms persist and Nuclear Stress Test is neg, he should FU with his rheumatologist to discuss alternatives to Meloxicam.  2. Chronic Diastolic CHF - Recent echo with normal LVF and mild diastolic dysfunction.  He continues to have some DOE.  His Echo showed high ventricular filling pressures.  I will increase his Lasix further to see if this helps.    -  Increase Lasix to 40 mg every M, W, F and take 20 mg all other days.  3. CKD -  Recent Creatinine stable.  Will repeat BMET today and repeat again in 2 weeks.   4. PVCs - Largely asymptomatic. He remains on beta blocker therapy.  5. Rheumatoid Arthritis - As noted, he has felt worse since he DC'd Meloxicam. With his CKD, NSAIDs should be avoided.  If his stress test is ok and he continues to have fatigue, DOE, etc, I would recommend he FU with his rheumatologist for alternative therapies to help with his RA.     Medication Adjustments/Labs and Tests Ordered: Current medicines are reviewed at length with the patient today.  Concerns regarding medicines are outlined above.  Medication changes, Labs and Tests ordered today are outlined in the Patient Instructions noted below. Patient Instructions  Medication Instructions:  1. INCREASE LASIX TO 40 MG EVERY MON, WED AND FRI'S; ON EVERY TUES, THURS, SAT AND SUN YOU WILL TAKE 20 MG  Labwork: 1. TODAY BMET  2. BMET TO BE DONE IN 2 WEEKS  Testing/Procedures: Your physician has requested that you have a lexiscan myoview. For further  information please visit [email or web address]. Please follow instruction sheet, as  given.   Follow-Up: KEEP YOUR APPT WITH Robert Rowe. Robert Rowe 01/18/16  Any Other Special Instructions Will Be Listed Below (If Applicable).     If you need a refill on your cardiac medications before your next appointment, please call your pharmacy.       Signed, Richardson Dopp, PA-C  12/09/2015 3:09 PM    [facility] [address], [place], [place]  [address] Phone: [phone]; Fax: [phone]

## 2015-12-09 ENCOUNTER — Encounter: Payer: Self-pay | Admitting: Physician Assistant

## 2015-12-09 ENCOUNTER — Ambulatory Visit (INDEPENDENT_AMBULATORY_CARE_PROVIDER_SITE_OTHER): Payer: Medicare Other | Admitting: Physician Assistant

## 2015-12-09 VITALS — BP 130/72 | HR 78 | Ht 62.0 in | Wt 146.8 lb

## 2015-12-09 DIAGNOSIS — I493 Ventricular premature depolarization: Secondary | ICD-10-CM

## 2015-12-09 DIAGNOSIS — R5383 Other fatigue: Secondary | ICD-10-CM

## 2015-12-09 DIAGNOSIS — M069 Rheumatoid arthritis, unspecified: Secondary | ICD-10-CM

## 2015-12-09 DIAGNOSIS — R0602 Shortness of breath: Secondary | ICD-10-CM

## 2015-12-09 DIAGNOSIS — I5032 Chronic diastolic (congestive) heart failure: Secondary | ICD-10-CM | POA: Diagnosis not present

## 2015-12-09 DIAGNOSIS — N183 Chronic kidney disease, stage 3 (moderate): Secondary | ICD-10-CM

## 2015-12-09 LAB — BASIC METABOLIC PANEL
BUN: 24 mg/dL (ref 7–25)
CO2: 27 mmol/L (ref 20–31)
Calcium: 9.4 mg/dL (ref 8.6–10.3)
Chloride: 101 mmol/L (ref 98–110)
Creat: 1.64 mg/dL — ABNORMAL HIGH (ref 0.70–1.11)
Glucose, Bld: 111 mg/dL — ABNORMAL HIGH (ref 65–99)
Potassium: 4 mmol/L (ref 3.5–5.3)
Sodium: 139 mmol/L (ref 135–146)

## 2015-12-09 NOTE — Patient Instructions (Signed)
Medication Instructions:  1. INCREASE LASIX TO 40 MG EVERY MON, WED AND FRI'S; ON EVERY TUES, THURS, SAT AND SUN YOU WILL TAKE 20 MG  Labwork: 1. TODAY BMET  2. BMET TO BE DONE IN 2 WEEKS  Testing/Procedures: Your physician has requested that you have a lexiscan myoview. For further information please visit [email or web address]. Please follow instruction sheet, as given.   Follow-Up: KEEP YOUR APPT WITH DR. [name] 01/18/16  Any Other Special Instructions Will Be Listed Below (If Applicable).     If you need a refill on your cardiac medications before your next appointment, please call your pharmacy.

## 2015-12-10 ENCOUNTER — Telehealth (HOSPITAL_COMMUNITY): Payer: Self-pay | Admitting: *Deleted

## 2015-12-10 ENCOUNTER — Telehealth: Payer: Self-pay | Admitting: *Deleted

## 2015-12-10 NOTE — Telephone Encounter (Signed)
Attempted to call patient regarding upcoming appointment- busy x 3. [name], RN

## 2015-12-10 NOTE — Telephone Encounter (Signed)
Pt has been notified of lab results by phone with verbal understanding. 

## 2015-12-11 ENCOUNTER — Encounter: Payer: Self-pay | Admitting: Cardiology

## 2015-12-14 ENCOUNTER — Encounter: Payer: Self-pay | Admitting: Physician Assistant

## 2015-12-14 ENCOUNTER — Telehealth: Payer: Self-pay | Admitting: *Deleted

## 2015-12-14 ENCOUNTER — Ambulatory Visit (HOSPITAL_COMMUNITY): Payer: Medicare Other | Attending: Internal Medicine

## 2015-12-14 DIAGNOSIS — R5383 Other fatigue: Secondary | ICD-10-CM | POA: Diagnosis not present

## 2015-12-14 DIAGNOSIS — R0609 Other forms of dyspnea: Secondary | ICD-10-CM | POA: Insufficient documentation

## 2015-12-14 DIAGNOSIS — R0602 Shortness of breath: Secondary | ICD-10-CM | POA: Diagnosis not present

## 2015-12-14 DIAGNOSIS — R002 Palpitations: Secondary | ICD-10-CM | POA: Insufficient documentation

## 2015-12-14 LAB — MYOCARDIAL PERFUSION IMAGING
LV dias vol: 62 mL
LV sys vol: 29 mL
Peak HR: 86 {beats}/min
RATE: 0.27
Rest HR: 70 {beats}/min
SDS: 1
SRS: 3
SSS: 4
TID: 1.07

## 2015-12-14 MED ORDER — REGADENOSON 0.4 MG/5ML IV SOLN
0.4000 mg | Freq: Once | INTRAVENOUS | Status: AC
  Administered 2015-12-14: 0.4 mg via INTRAVENOUS

## 2015-12-14 MED ORDER — TECHNETIUM TC 99M SESTAMIBI GENERIC - CARDIOLITE
10.7000 | Freq: Once | INTRAVENOUS | Status: AC | PRN
  Administered 2015-12-14: 11 via INTRAVENOUS

## 2015-12-14 MED ORDER — TECHNETIUM TC 99M SESTAMIBI GENERIC - CARDIOLITE
32.5000 | Freq: Once | INTRAVENOUS | Status: AC | PRN
  Administered 2015-12-14: 33 via INTRAVENOUS

## 2015-12-14 NOTE — Telephone Encounter (Signed)
Lmptcb for myoview results 

## 2015-12-21 ENCOUNTER — Other Ambulatory Visit: Payer: Self-pay | Admitting: Internal Medicine

## 2015-12-21 ENCOUNTER — Telehealth: Payer: Self-pay | Admitting: Internal Medicine

## 2015-12-21 MED ORDER — CLOPIDOGREL BISULFATE 75 MG PO TABS: 75.0000 mg | ORAL_TABLET | Freq: Every day | ORAL | Status: DC

## 2015-12-21 MED ORDER — ACYCLOVIR 400 MG PO TABS
ORAL_TABLET | ORAL | Status: DC
Start: 2015-12-21 — End: 2016-01-06

## 2015-12-21 NOTE — Telephone Encounter (Signed)
Rx sent to pharmacy   

## 2015-12-21 NOTE — Telephone Encounter (Signed)
Pt needs a refill on his plavix sent to [facility] as well.

## 2015-12-21 NOTE — Telephone Encounter (Signed)
Dr.K, please call pt. I sent Rx to pharmacy.

## 2015-12-21 NOTE — Telephone Encounter (Signed)
Patient is requesting to speak with MD. Patient states for MD to call cell phone [phone].

## 2015-12-22 ENCOUNTER — Telehealth: Payer: Self-pay | Admitting: Internal Medicine

## 2015-12-22 ENCOUNTER — Telehealth: Payer: Self-pay | Admitting: Physician Assistant

## 2015-12-22 DIAGNOSIS — E039 Hypothyroidism, unspecified: Secondary | ICD-10-CM

## 2015-12-22 NOTE — Telephone Encounter (Signed)
Patient called wanting to speak with MD.  Made patient aware that the MD was in seeing patients patient requested a call back from the MD on his cell phone [phone].

## 2015-12-22 NOTE — Telephone Encounter (Signed)
New Message   Pt wants Nicki Reaper to call him and he refused to give me any explanation

## 2015-12-23 ENCOUNTER — Other Ambulatory Visit: Payer: Medicare Other | Admitting: *Deleted

## 2015-12-23 DIAGNOSIS — N183 Chronic kidney disease, stage 3 (moderate): Secondary | ICD-10-CM

## 2015-12-23 DIAGNOSIS — R0602 Shortness of breath: Secondary | ICD-10-CM | POA: Diagnosis not present

## 2015-12-23 DIAGNOSIS — I5032 Chronic diastolic (congestive) heart failure: Secondary | ICD-10-CM | POA: Diagnosis not present

## 2015-12-23 NOTE — Telephone Encounter (Signed)
I called and s/w pt who wanted to know what was the lab work he getting today, I stated BMET. Pt askked would [place] PA be ok checking a TSH. I said this would be fine, so labs today will be BMET, TSH.

## 2015-12-24 ENCOUNTER — Telehealth: Payer: Self-pay | Admitting: Physician Assistant

## 2015-12-24 NOTE — Telephone Encounter (Signed)
New message ° ° ° ° ° ° °Calling to get lab results °

## 2015-12-28 ENCOUNTER — Telehealth: Payer: Self-pay | Admitting: Physician Assistant

## 2015-12-28 NOTE — Telephone Encounter (Signed)
Robert Rowe, [facility] s/w pt per pt request. Pt was given lab results for bmet at this time as well. I had gone over TSH results earlier today, when pt request for PA tcb becuase he had some other questions he wanted to ask.

## 2015-12-28 NOTE — Telephone Encounter (Signed)
New message    patient had some test done last Wednesday - wants to discuss with APP.

## 2015-12-28 NOTE — Telephone Encounter (Signed)
Robert Rowe, [facility] s/w pt per pt request. Pt was given lab results for bmet at this time as well. I had gone over TSH results earlier today, when pt request for PA tcb becuase he had some other questions he wanted to ask.

## 2015-12-28 NOTE — Telephone Encounter (Signed)
Called patient and went over his labs. He still is fatigued. Edema is better. Dyspnea is overall stable. No chest pain. Recent stress test was low risk. I have asked him to FU with his PCP and rheumatologist for his fatigue.Richardson Dopp, PA-C   12/28/2015 5:26 PM

## 2015-12-28 NOTE — Telephone Encounter (Signed)
I s/w pt this morning and apologized for the delay in getting his lab results. I explained to him that the lab did not send results to chart, therefore our lab tech had to call several times to get results faxed to our office for review. I also stated that we did not get the bmet results until late Friday. I assured pt that  Richardson Dopp, PA has the results and once he reviews the bmet I will be happy to cb with those results. Pt said thank you and then he did ask for [employer]. PA tcb because there were a couple of other things that he wanted to speak to him about, though he did not tell me wha this was. I stated that will be fine and I will let Brynda Rim. PA know of our convesation today and have him cb later today. Pt again said thank you.

## 2015-12-28 NOTE — Telephone Encounter (Signed)
Follow Up  Pt called states that he hasn't heard back from [facility] and he is looking forward to the call.

## 2015-12-29 ENCOUNTER — Encounter: Payer: Self-pay | Admitting: Physician Assistant

## 2016-01-04 ENCOUNTER — Telehealth: Payer: Self-pay | Admitting: Physician Assistant

## 2016-01-04 NOTE — Telephone Encounter (Signed)
New message       Pt request to talk to [place] only

## 2016-01-04 NOTE — Telephone Encounter (Signed)
I called pt today who said he did not get his lab results yet. I advised pt that we went over the results the other day and that I can not put them into [place] for him since the lab had to be scanned in, I did however mail the results Friday to pt.

## 2016-01-05 ENCOUNTER — Telehealth: Payer: Self-pay | Admitting: Physician Assistant

## 2016-01-05 DIAGNOSIS — N99113 Postprocedural anterior urethral stricture: Secondary | ICD-10-CM | POA: Diagnosis not present

## 2016-01-05 DIAGNOSIS — Z0000 Encounter for general adult medical examination without abnormal findings: Secondary | ICD-10-CM | POA: Diagnosis not present

## 2016-01-05 NOTE — Telephone Encounter (Signed)
Please call him,states he is having a new problem.

## 2016-01-05 NOTE — Telephone Encounter (Signed)
I called back pt who advised me that his SBP today at urologist was 200. Pt states that he has not been feeling good since last summer somtime after stopping his arthitis medication. Pt denies any cp or sob. I offered 2/15 appt 2:40 with Nicki Reaper, pt ok. I will d/w Scott about BP and let him know that I have scheduled pt to see him tomorrow. Pt said thank you.

## 2016-01-05 NOTE — Progress Notes (Signed)
Cardiology Office Note:    Date:  01/06/2016   ID:  Robert Emery  MD, DOB [birth year], MRN [id number]  PCP:  Nyoka Cowden, MD  Cardiologist:  Dr. Darlin Coco   Electrophysiologist:  n/a  Chief Complaint  Patient presents with  . 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 He has had numerous endoscopies and esophageal dilatations in the past. He does have a history of chest pain often triggered by alcohol ingestion or overeating. He had chest pain at [facility] in 2014. Stress test there was negative. Last seen by Dr. [name] 8/16.  Admitted 11/15/15 with chest pain. He had minimal relief with nitroglycerin at home. Cardiac enzymes remained negative. Telemetry was unremarkable. PVCs were noted.   He was seen 12/16 for volume excess. He was placed on Lasix.  Echo demonstrated normal LVF, mild diastolic dysfunction, mild to mod pulmonary HTN.   I saw him last 12/09/15.  He has complained of significant fatigue that seems to be related to stopping his Meloxicam.  His rheumatologist asked him to do this.  I had him do a Nuclear stress test.  This was low risk with normal perfusion.  He called in recently with elevated BP and is added on today for further evaluation.  He saw his urologist yesterday for recurrent urethral stricture. He underwent dilatation. Blood pressure was 190/110. He decided to come in for follow-up. He denies chest pain. He denies orthopnea or PND. LE edema is worse. Overall, he feels that his breathing has gotten worse as well. Denies syncope. He continues to feel fatigued. He placed himself back on meloxicam.     Past Medical History  Diagnosis Date  . BENIGN PROSTATIC HYPERTROPHY 04/05/2007  . CHEST DISCOMFORT 04/25/2009  . GERD 12/03/2007  . HYPERLIPIDEMIA 04/05/2007  . Palpitations  02/12/2010  . PEDAL EDEMA 05/01/2008  . TRANSIENT ISCHEMIC ATTACK, HX OF 04/05/2007    ?   Marland Kitchen Hiatal hernia   . Hypothyroidism   . Dysrhythmia     pvc's   . Chronic kidney disease     chronic   . Osteoarth NOS-Unspec 04/05/2007  . Rheumatoid arthritis(714.0) 04/05/2007  . DJD (degenerative joint disease)   . 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 Chronic diastolic CHF (congestive heart failure) ([place])     a. Echo 12/16: mild LVH, EF 55-60%, no RWMA, Gr 1 DD, mild AI, MAC, mild LAE, normal RVF, PASP 37 mmHg  . History of nuclear stress test     Myoview 1/17: EF 53%, normal perfusion; Low Risk    Past Surgical History  Procedure Laterality Date  . Hemorrhoid surgery    . Hernia repair      inguinal x5  . Cataract extraction  2001    bilateral  . Knee surgery  arthroscopic    left  . Shoulder surgery  few years ago    left  . Prostate cryoablation      2000  . Esophagogastroduodenoscopy N/A 03/12/2013    Procedure: ESOPHAGOGASTRODUODENOSCOPY (EGD);  Surgeon: Inda Castle, MD;  Location: [facility];  Service: Endoscopy;  Laterality: N/A;  . Balloon dilation N/A 03/12/2013    Procedure: BALLOON DILATION;  Surgeon: Inda Castle, MD;  Location: [facility];  Service: Endoscopy;  Laterality: N/A;  . Transurethral resection of prostate N/A 03/19/2015    Procedure: TRANSURETHRAL RESECTION OF THE PROSTATE (TURP);  Surgeon: Carolan Clines, MD;  Location: [facility];  Service: Urology;  Laterality: N/A;  . Cystoscopy with urethral dilatation N/A 09/24/2015    Procedure: CYSTOSCOPY WITH COOK BALLOON DILATATION OF URETHRAL STRICTURE ;  Surgeon: Carolan Clines, MD;  Location: [facility];  Service: Urology;  Laterality: N/A;    Current Medications: Outpatient Prescriptions Prior to Visit  Medication Sig Dispense Refill  . clopidogrel (PLAVIX) 75 MG tablet Take 1 tablet (75 mg total) by mouth daily. 90 tablet 1  . hydrocortisone 2.5 % cream APPLY TOPICALLY ONCE TO  AFFECTED AREA  AT BEDTIME.  6  . levothyroxine (SYNTHROID, LEVOTHROID) 50 MCG tablet TAKE 1 TABLET (50 MCG TOTAL) BY MOUTH DAILY. 90 tablet 3  . methotrexate (RHEUMATREX) 2.5 MG tablet Take 3 tablets (7.5 mg total) by mouth once a week. Take 7.5 mg once a week on Mondays 12 tablet 1  . metoprolol succinate (TOPROL-XL) 25 MG 24 hr tablet Take 1 tablet (25 mg total) by mouth at bedtime. 30 tablet 6  . nitroGLYCERIN (NITROSTAT) 0.4 MG SL tablet Place 0.4 mg under the tongue every 5 (five) minutes as needed (ESOPHEAGEAL SPASPMS).    . pantoprazole (PROTONIX) 40 MG tablet Take 1 tablet (40 mg total) by mouth daily. 90 tablet 3  . furosemide (LASIX) 20 MG tablet Take 40 mg by mouth daily.    Marland Kitchen acyclovir (ZOVIRAX) 400 MG tablet 1 tablet 3 times daily for 5 days (Patient not taking: Reported on 01/06/2016) 30 tablet 3   No facility-administered medications prior to visit.     Allergies:   Sulfonamide derivatives   Social History   Social History  . Marital Status: Married    Spouse Name: N/A  . Number of Children: N/A  . Years of Education: N/A   Social History Main Topics  . Smoking status: Former Smoker    Quit date: 08/13/1953  . Smokeless tobacco: Never Used  . Alcohol Use: 0.6 oz/week    1 Shots of liquor per week     Comment: patient has given up alcohol  . Drug Use: No  . Sexual Activity: Not Asked   Other Topics Concern  . None   Social History Narrative     Family History:  The patient's family history includes Breast cancer in his mother; Heart attack in his father; Heart disease in his father; Lymphoma in his brother. There is no history of Stroke or Hypertension.   ROS:   Please see the history of present illness.    Review of Systems  Constitution: Positive for malaise/fatigue.  Cardiovascular: Positive for dyspnea on exertion.  All other systems reviewed and are negative.   Physical Exam:    VS:  BP 174/70 mmHg  Pulse 78  Ht 5\' 6"  (1.676 m)  Wt 150 lb 1.9 oz  (68.094 kg)  BMI 24.24 kg/m2   GEN: Well nourished, well developed, in no acute distress HEENT: normal Neck: no JVD, no masses Cardiac: Normal S1/S2, RRR; no murmurs, 1-2+ bilateral LE edema    Respiratory:  clear to auscultation bilaterally; no wheezing, rhonchi or rales GI: soft, nontender  MS: no deformity or atrophy Skin: warm and dry  Neuro: no focal deficits  Psych: Alert and oriented x 3, normal affect  Wt Readings from Last 3 Encounters:  01/06/16 150 lb 1.9 oz (68.094 kg)  12/14/15 146 lb (66.225 kg)  12/09/15 146 lb 12.8 oz (66.588 kg)      Studies/Labs Reviewed:     EKG:  EKG is no ordered today.  The ekg ordered today demonstrates n/a  Recent Labs: 06/22/2015: ALT 12 10/30/2015: Pro B Natriuretic peptide (BNP) 139.0* 11/15/2015: Hemoglobin 14.0; Magnesium 1.9; Platelets 218; TSH 3.014 12/09/2015: BUN 24; Creat 1.64*; Potassium 4.0; Sodium 139   Recent Lipid Panel    Component Value Date/Time   CHOL 219* 11/15/2015 0421   TRIG 349* 11/15/2015 0421   HDL 33* 11/15/2015 0421   CHOLHDL 6.6 11/15/2015 0421   VLDL 70* 11/15/2015 0421   LDLCALC 116* 11/15/2015 0421    Additional studies/ records that were reviewed today include:   Nuc Med Stress Test 12/14/15 Myocardial perfusion is normal. The study is normal. This is a low risk study. Overall left ventricular systolic function was normal. LV cavity size is normal. Nuclear stress EF: 53%. The left ventricular ejection fraction is normal (55-65%). A prior study was conducted on 03/06/2013.There are no significant changes in comparison to the prior study.  Echo 11/18/15 Mild LVH, EF 55-60%, normal wall motion, grade 1 diastolic dysfunction, mild AI, MAC, mild LAE, normal RVSF, PASP 37 mmHg  Myoview 4/14 EF 57%, no scar or ischemia  Echo 2/14 Mild LVH, EF 55-60%, normal wall motion, grade 1 diastolic dysfunction, mild AI, MAC, mild MR, mild LAE, PASP 34 mmHg   ASSESSMENT:     1. Elevated blood pressure   2.  Chronic diastolic CHF (congestive heart failure) ([place])   3. CKD (chronic kidney disease), stage 3 (moderate)   4. PVC's (premature ventricular contractions)     PLAN:     In order of problems listed above:  1. Elevated BP - Blood pressure is markedly elevated compared to previous blood pressures. He recently placed himself back on meloxicam. His weight is up and he seems to have some evidence of volume excess. He also underwent a urologic procedure yesterday. Repeat blood pressure by me is 150/80. I will adjust his Lasix. He has follow-up in a little over a week with Dr. [name]. He will keep that appointment.   2. Chronic Diastolic CHF - Recent echo with normal LVF and mild diastolic dysfunction. Myoview was done and this was low risk with normal perfusion.  As noted, he appears to be volume overloaded. His blood pressure is also increased.   -  Increase Lasix to 40 mg QD  -  BMET, BNP 1 week.  3. CKD - Will keep a close eye on renal function and K+ with increase in Lasix.    4. PVCs - Largely asymptomatic. He remains on beta blocker therapy.    Medication Adjustments/Labs and Tests Ordered: Current medicines are reviewed at length with the patient today.  Concerns regarding medicines are outlined above.  Medication changes, Labs and Tests ordered today are outlined in the Patient Instructions noted below. Patient Instructions  Medication Instructions:  Increase Furosemide to 40 mg Once daily (you can take 2 of the 20 mg tablets to = 40 mg).  A new prescription was sent to your pharmacy.   Labwork: 01/14/16 FOR BMET, BNP  Testing/Procedures: None   Follow-Up: Dr. Darlin Coco as scheduled on 01/18/16.  Any Other Special Instructions Will Be Listed Below (If Applicable).  If you need a refill on your cardiac medications before your next appointment, please call your pharmacy.    Signed, Richardson Dopp, PA-C  01/06/2016 3:41 PM    [facility]  [place], [place], [place]  [address] Phone: [phone]; Fax: [phone]

## 2016-01-06 ENCOUNTER — Ambulatory Visit (INDEPENDENT_AMBULATORY_CARE_PROVIDER_SITE_OTHER): Payer: Medicare Other | Admitting: Physician Assistant

## 2016-01-06 ENCOUNTER — Encounter: Payer: Self-pay | Admitting: Physician Assistant

## 2016-01-06 VITALS — BP 174/70 | HR 78 | Ht 66.0 in | Wt 150.1 lb

## 2016-01-06 DIAGNOSIS — I5032 Chronic diastolic (congestive) heart failure: Secondary | ICD-10-CM

## 2016-01-06 DIAGNOSIS — R030 Elevated blood-pressure reading, without diagnosis of hypertension: Secondary | ICD-10-CM

## 2016-01-06 DIAGNOSIS — N183 Chronic kidney disease, stage 3 (moderate): Secondary | ICD-10-CM

## 2016-01-06 DIAGNOSIS — I493 Ventricular premature depolarization: Secondary | ICD-10-CM | POA: Diagnosis not present

## 2016-01-06 DIAGNOSIS — IMO0001 Reserved for inherently not codable concepts without codable children: Secondary | ICD-10-CM

## 2016-01-06 MED ORDER — FUROSEMIDE 20 MG PO TABS: 40.0000 mg | ORAL_TABLET | Freq: Every day | ORAL | Status: DC

## 2016-01-06 NOTE — Telephone Encounter (Signed)
[place], [place]   01/06/2016 1:32 PM

## 2016-01-06 NOTE — Patient Instructions (Addendum)
Medication Instructions:  Increase Furosemide to 40 mg Once daily (you can take 2 of the 20 mg tablets to = 40 mg).  A new prescription was sent to your pharmacy.   Labwork: 01/14/16 FOR BMET, BNP  Testing/Procedures: None   Follow-Up: Dr. Darlin Coco as scheduled on 01/18/16.  Any Other Special Instructions Will Be Listed Below (If Applicable).  If you need a refill on your cardiac medications before your next appointment, please call your pharmacy.

## 2016-01-14 ENCOUNTER — Other Ambulatory Visit: Payer: Medicare Other | Admitting: *Deleted

## 2016-01-14 DIAGNOSIS — I5032 Chronic diastolic (congestive) heart failure: Secondary | ICD-10-CM

## 2016-01-14 DIAGNOSIS — R030 Elevated blood-pressure reading, without diagnosis of hypertension: Principal | ICD-10-CM

## 2016-01-14 DIAGNOSIS — N183 Chronic kidney disease, stage 3 (moderate): Secondary | ICD-10-CM | POA: Diagnosis not present

## 2016-01-14 DIAGNOSIS — IMO0001 Reserved for inherently not codable concepts without codable children: Secondary | ICD-10-CM

## 2016-01-15 ENCOUNTER — Telehealth: Payer: Self-pay | Admitting: Internal Medicine

## 2016-01-15 NOTE — Telephone Encounter (Signed)
Patient would like for the MD to give him a call

## 2016-01-18 ENCOUNTER — Ambulatory Visit (INDEPENDENT_AMBULATORY_CARE_PROVIDER_SITE_OTHER): Payer: Medicare Other | Admitting: Cardiology

## 2016-01-18 ENCOUNTER — Encounter: Payer: Self-pay | Admitting: Cardiology

## 2016-01-18 VITALS — BP 120/84 | HR 80 | Ht 62.0 in | Wt 147.8 lb

## 2016-01-18 DIAGNOSIS — I5032 Chronic diastolic (congestive) heart failure: Secondary | ICD-10-CM | POA: Diagnosis not present

## 2016-01-18 DIAGNOSIS — R5383 Other fatigue: Secondary | ICD-10-CM | POA: Diagnosis not present

## 2016-01-18 DIAGNOSIS — N183 Chronic kidney disease, stage 3 (moderate): Secondary | ICD-10-CM | POA: Diagnosis not present

## 2016-01-18 NOTE — Progress Notes (Signed)
Cardiology Office Note   Date:  01/18/2016   ID:  Robert Emery  MD, DOB [birth year], MRN [id number]  PCP:  Nyoka Cowden, MD  Cardiologist: Darlin Coco MD  Chief Complaint  Patient presents with  . scheduled follow up    frequent PVC's      History of Present Illness: Robert Emery  MD is a 82 y.o. male who presents for scheduled follow-up office visit.  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 He has had numerous endoscopies and esophageal dilatations in the past. He does have a history of chest pain often triggered by alcohol ingestion or overeating. He had chest pain at [facility] in 2014. Stress test there was negative. Last seen by Dr. [name] 8/16.  Admitted 11/15/15 with chest pain. He had minimal relief with nitroglycerin at home. Cardiac enzymes remained negative. Telemetry was unremarkable. PVCs were noted.   He was seen 12/16 for volume excess. He was placed on Lasix. Echo demonstrated normal LVF, mild diastolic dysfunction, mild to mod pulmonary HTN.   He has been worried about his renal function.  He has tried to stay off of meloxicam as much as possible and he is also trying to cut back on his Protonix.  He has furosemide 40 mg on hand but does not take it every day.  He skips it if he is traveling.  The patient had blood work on 01/14/16 which showed that his renal function is stable with creatinine 1.6 to and his B natruretic peptide was normal at 36.5.  He has had mild bilateral peripheral edema which we have suspected is secondary to venous insufficiency rather than to ongoing congestive heart failure..  Past Medical History  Diagnosis Date  . BENIGN PROSTATIC HYPERTROPHY 04/05/2007  . CHEST DISCOMFORT 04/25/2009  . GERD 12/03/2007  . HYPERLIPIDEMIA 04/05/2007  . Palpitations 02/12/2010  . PEDAL EDEMA 05/01/2008  . TRANSIENT  ISCHEMIC ATTACK, HX OF 04/05/2007    ?   Marland Kitchen Hiatal hernia   . Hypothyroidism   . Dysrhythmia     pvc's   . Chronic kidney disease     chronic   . Osteoarth NOS-Unspec 04/05/2007  . Rheumatoid arthritis(714.0) 04/05/2007  . DJD (degenerative joint disease)   . 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 Chronic diastolic CHF (congestive heart failure) ([place])     a. Echo 12/16: mild LVH, EF 55-60%, no RWMA, Gr 1 DD, mild AI, MAC, mild LAE, normal RVF, PASP 37 mmHg  . History of nuclear stress test     Myoview 1/17: EF 53%, normal perfusion; Low Risk    Past Surgical History  Procedure Laterality Date  . Hemorrhoid surgery    . Hernia repair      inguinal x5  . Cataract extraction  2001    bilateral  . Knee surgery  arthroscopic    left  . Shoulder surgery  few years ago    left  . Prostate cryoablation      2000  . Esophagogastroduodenoscopy N/A 03/12/2013    Procedure: ESOPHAGOGASTRODUODENOSCOPY (EGD);  Surgeon: Inda Castle, MD;  Location: [facility];  Service: Endoscopy;  Laterality: N/A;  . Balloon dilation N/A 03/12/2013    Procedure: BALLOON DILATION;  Surgeon: Inda Castle, MD;  Location: [facility];  Service: Endoscopy;  Laterality: N/A;  .  Transurethral resection of prostate N/A 03/19/2015    Procedure: TRANSURETHRAL RESECTION OF THE PROSTATE (TURP);  Surgeon: Carolan Clines, MD;  Location: [facility];  Service: Urology;  Laterality: N/A;  . Cystoscopy with urethral dilatation N/A 09/24/2015    Procedure: CYSTOSCOPY WITH COOK BALLOON DILATATION OF URETHRAL STRICTURE ;  Surgeon: Carolan Clines, MD;  Location: [facility];  Service: Urology;  Laterality: N/A;     Current Outpatient Prescriptions  Medication Sig Dispense Refill  . clopidogrel (PLAVIX) 75 MG tablet Take 1 tablet (75 mg total) by mouth daily. 90 tablet 1  . furosemide (LASIX) 40 MG tablet Take 40 mg by mouth daily.  11  . hydrocortisone 2.5 % cream APPLY TOPICALLY ONCE TO AFFECTED AREA   AT BEDTIME.  6  . levothyroxine (SYNTHROID, LEVOTHROID) 50 MCG tablet TAKE 1 TABLET (50 MCG TOTAL) BY MOUTH DAILY. 90 tablet 3  . methotrexate (RHEUMATREX) 2.5 MG tablet Take 3 tablets (7.5 mg total) by mouth once a week. Take 7.5 mg once a week on Mondays 12 tablet 1  . metoprolol succinate (TOPROL-XL) 25 MG 24 hr tablet Take 1 tablet (25 mg total) by mouth at bedtime. 30 tablet 6  . nitroGLYCERIN (NITROSTAT) 0.4 MG SL tablet Place 0.4 mg under the tongue every 5 (five) minutes as needed (ESOPHEAGEAL SPASPMS).    . pantoprazole (PROTONIX) 40 MG tablet Take 1 tablet (40 mg total) by mouth daily. 90 tablet 3   No current facility-administered medications for this visit.    Allergies:   Sulfonamide derivatives    Social History:  The patient  reports that he quit smoking about 62 years ago. He has never used smokeless tobacco. He reports that he drinks about 0.6 oz of alcohol per week. He reports that he does not use illicit drugs.   Family History:  The patient's family history includes Breast cancer in his mother; Heart attack in his father; Heart disease in his father; Lymphoma in his brother. There is no history of Stroke or Hypertension.    ROS:  Please see the history of present illness.   Otherwise, review of systems are positive for none.   All other systems are reviewed and negative.    PHYSICAL EXAM: VS:  BP 120/84 mmHg  Pulse 80  Ht 5\' 2"  (1.575 m)  Wt 147 lb 12.8 oz (67.042 kg)  BMI 27.03 kg/m2 , BMI Body mass index is 27.03 kg/(m^2). GEN: Well nourished, well developed, in no acute distress HEENT: normal Neck: no JVD, carotid bruits, or masses Cardiac: RRR; no murmurs, rubs, or gallops,1+ edema bilat. Respiratory:  clear to auscultation bilaterally, normal work of breathing GI: soft, nontender, nondistended, + BS MS: no deformity or atrophy Skin: warm and dry, no rash Neuro:  Strength and sensation are intact Psych: euthymic mood, full affect   EKG:  EKG is ordered  today. The ekg ordered today demonstrates normal sinus rhythm at 77 bpm.  Within normal limits.  No PVCs are seen.   Recent Labs: 06/22/2015: ALT 12 10/30/2015: Pro B Natriuretic peptide (BNP) 139.0* 11/15/2015: Hemoglobin 14.0; Magnesium 1.9; Platelets 218; TSH 3.014 12/09/2015: BUN 24; Creat 1.64*; Potassium 4.0; Sodium 139    Lipid Panel    Component Value Date/Time   CHOL 219* 11/15/2015 0421   TRIG 349* 11/15/2015 0421   HDL 33* 11/15/2015 0421   CHOLHDL 6.6 11/15/2015 0421   VLDL 70* 11/15/2015 0421   LDLCALC 116* 11/15/2015 0421      Wt Readings from Last 3 Encounters:  01/18/16 147 lb 12.8 oz (67.042 kg)  01/06/16 150 lb 1.9 oz (68.094 kg)  12/14/15 146 lb (66.225 kg)         ASSESSMENT AND PLAN:  1. Elevated BP - improved since last visit.  Continue current medication.  Continue beta blocker and furosemide.   2. Chronic Diastolic CHF - Recent echo with normal LVF and mild diastolic dysfunction. Myoview was done and this was low risk with normal perfusion. He will continue on furosemide 40 mg daily.  His normal B natruretic peptide is consistent with a lot of his edema being secondary to venous insufficiency.  3. CKD - serum creatinine on 01/14/16 was 1.62, stable.   4. PVCs - Largely asymptomatic. He remains on beta blocker therapy.  No PVCs noted today.   Current medicines are reviewed at length with the patient today.  The patient does not have concerns regarding medicines.  The following changes have been made:  no change  Labs/ tests ordered today include:   Orders Placed This Encounter  Procedures  . EKG 12-Lead     Disposition:  Continue current medication.  He will be seen in 2 months by Dr. [name] at [facility], Darlin Coco MD 01/18/2016 1:11 PM    [address], [place], [place]  [address] Phone: [phone]; Fax: [phone]

## 2016-01-18 NOTE — Patient Instructions (Signed)
Medication Instructions:  Your physician recommends that you continue on your current medications as directed. Please refer to the Current Medication list given to you today.  Labwork: NONE  Testing/Procedures: NONE  Follow-Up: Your physician recommends that you schedule a follow-up appointment in: [place]   If you need a refill on your cardiac medications before your next appointment, please call your pharmacy.

## 2016-01-20 ENCOUNTER — Telehealth: Payer: Self-pay | Admitting: Internal Medicine

## 2016-01-20 NOTE — Telephone Encounter (Signed)
Dr.K, pt wants to speak to you.

## 2016-01-20 NOTE — Telephone Encounter (Signed)
Called and discussed Patient will call for an appointment tomorrow for evaluation Friday.  If weakness is not improved

## 2016-01-20 NOTE — Telephone Encounter (Signed)
Pt is very weak, unexplained and sudden within the last day or so.  He is interested in getting a B12 shot.

## 2016-01-27 DIAGNOSIS — L570 Actinic keratosis: Secondary | ICD-10-CM | POA: Diagnosis not present

## 2016-01-27 DIAGNOSIS — L812 Freckles: Secondary | ICD-10-CM | POA: Diagnosis not present

## 2016-01-27 DIAGNOSIS — L821 Other seborrheic keratosis: Secondary | ICD-10-CM | POA: Diagnosis not present

## 2016-02-14 IMAGING — MR MR MRA NECK W/O CM
4 series · 18 of 48 positions shown · non-contrast
Comparison: 03/06/2015 brain MR. 01/09/2012 brain MR, MR angiogram
circle of Willis and MR angiogram neck.

CLINICAL DATA: [AGE] male with episodes of confusion. Chronic
kidney disease. Hyperlipidemia. Subsequent encounter.

EXAM:
MRA HEAD WITHOUT CONTRAST
MRA NECK WITHOUT CONTRAST
TECHNIQUE: Angiographic images of the Circle of Willis were obtained using MRA
technique without intravenous contrast. Angiographic images of the
neck were obtained using MRA technique without intravenous contrast.
Carotid stenosis measurements (when applicable) are obtained
utilizing NASCET criteria, using the distal internal carotid
diameter as the denominator.

[Series 3: (id) mt fs · axial · 1.4mm · 0.43mm/px · z∈[-58,+48]mm · 8 of 152 slices shown]
[im 1/152]
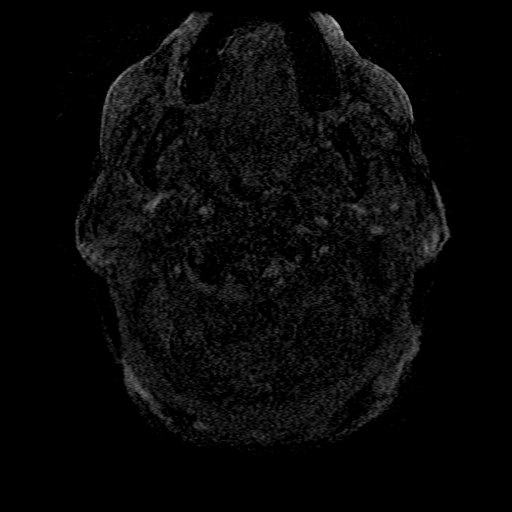
[im 17/152]
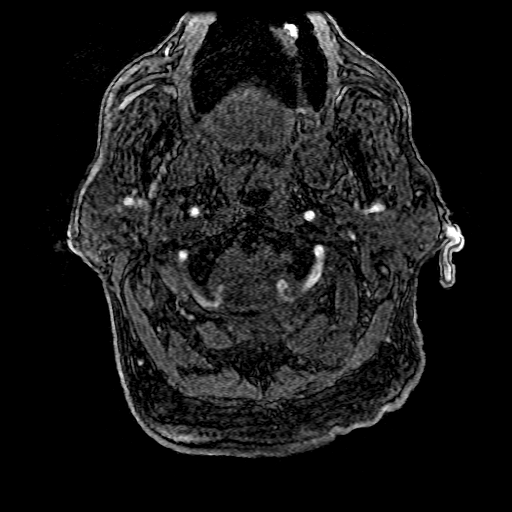
[im 51/152]
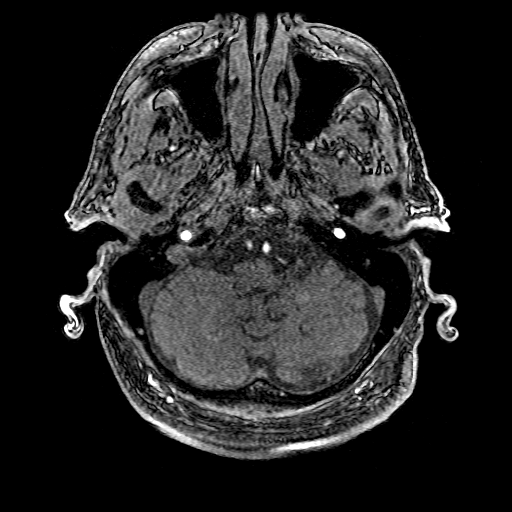
[im 68/152]
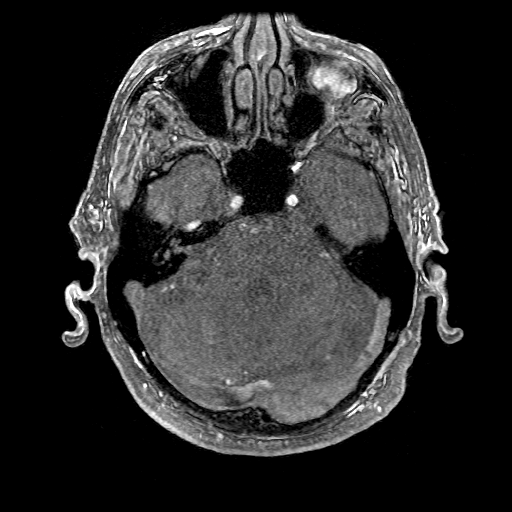
[im 84/152]
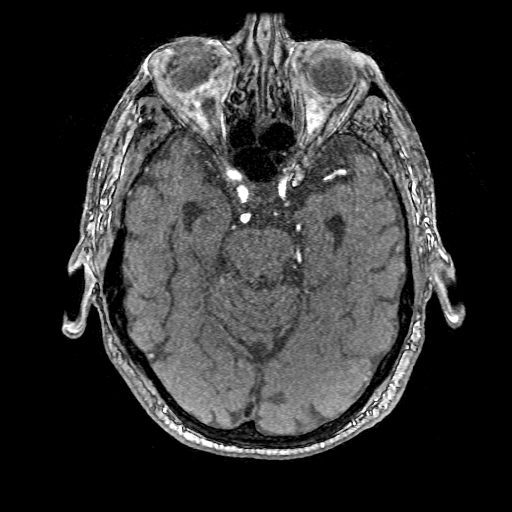
[im 101/152]
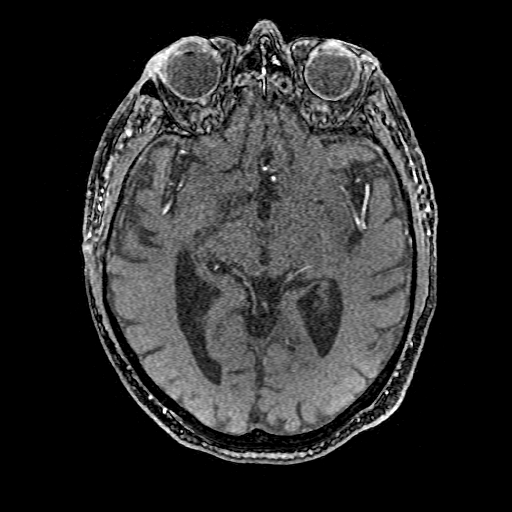
[im 135/152]
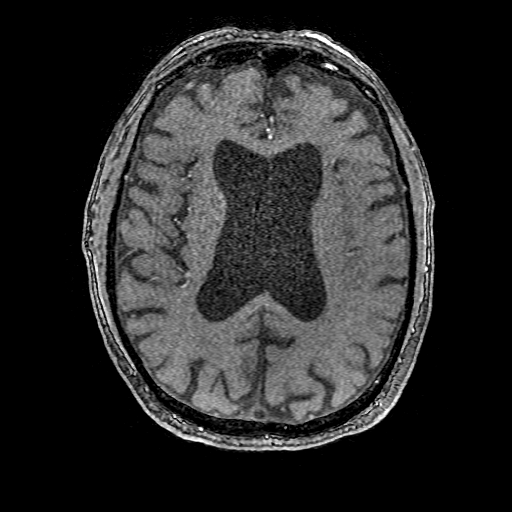
[im 152/152]
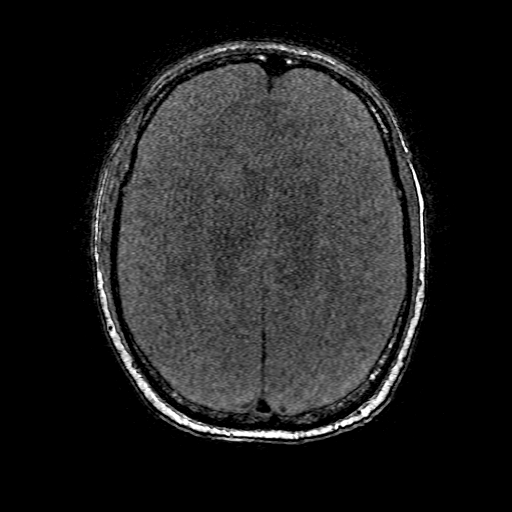

[Series 9: ax (id) · axial · 2.8mm · 0.47mm/px · z∈[-207,-83]mm · 4 of 108 slices shown]
[im 1/108]
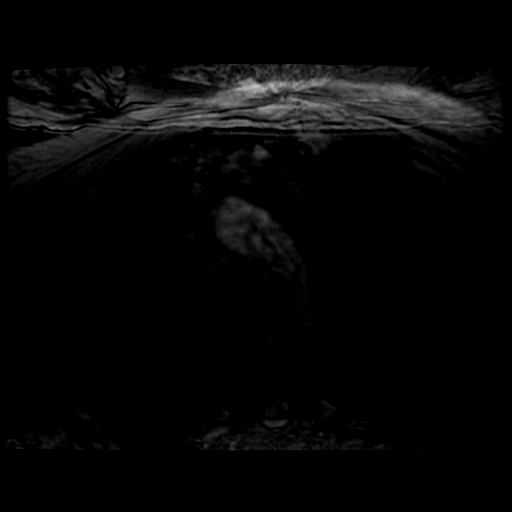
[im 18/108]
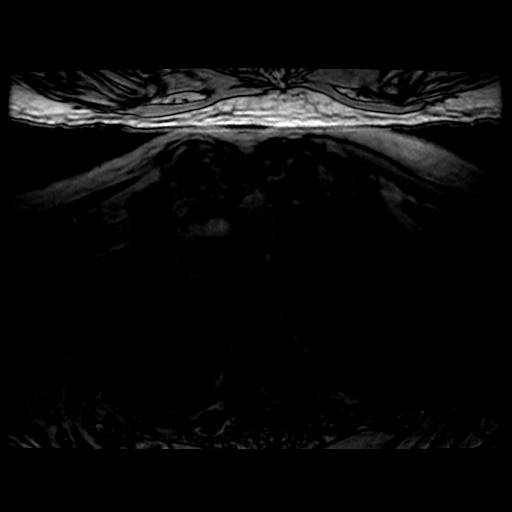
[im 54/108]
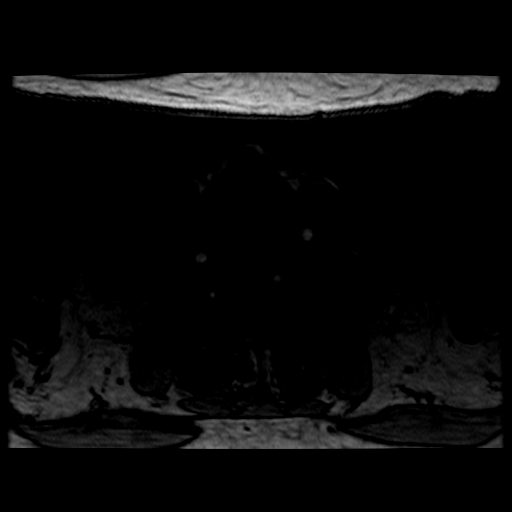
[im 90/108]
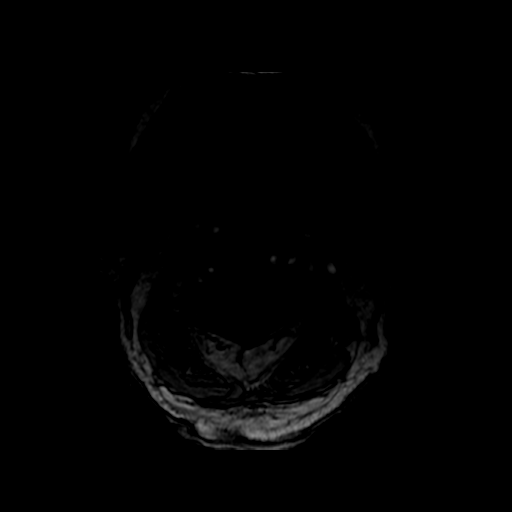

[Series 10: sag inhance carotid · sagittal · 1.6mm · 0.61mm/px · 3 of 400 slices shown]
[im 64/400]
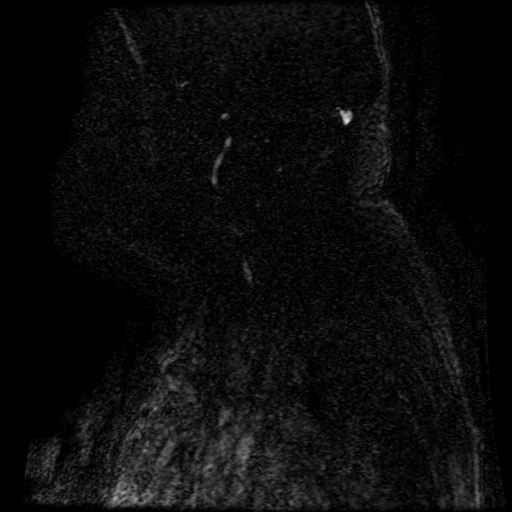
[im 208/400]
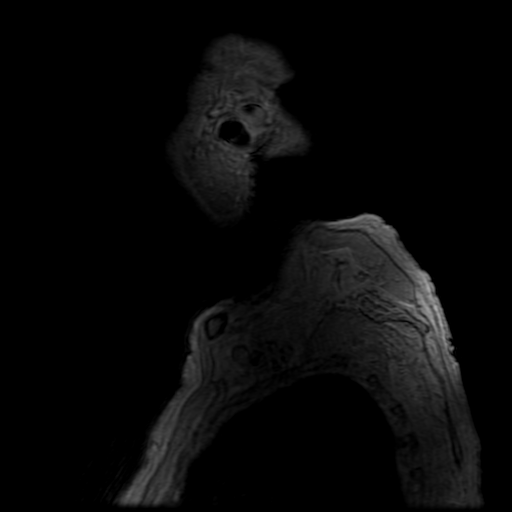
[im 336/400]
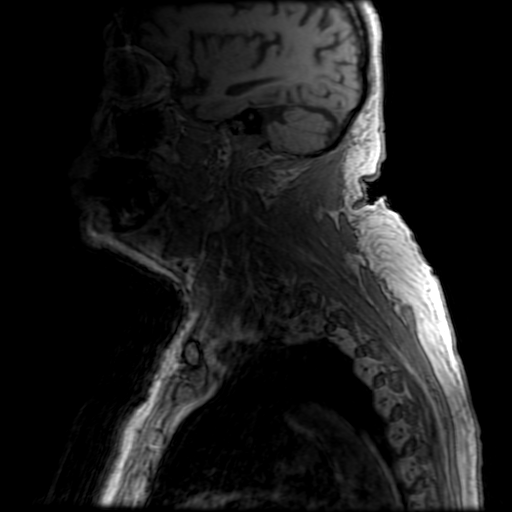

[Series 11: ax (id) asset · axial · 3.0mm · 0.47mm/px · z∈[-179,-37]mm · 3 of 76 slices shown]
[im 1/76]
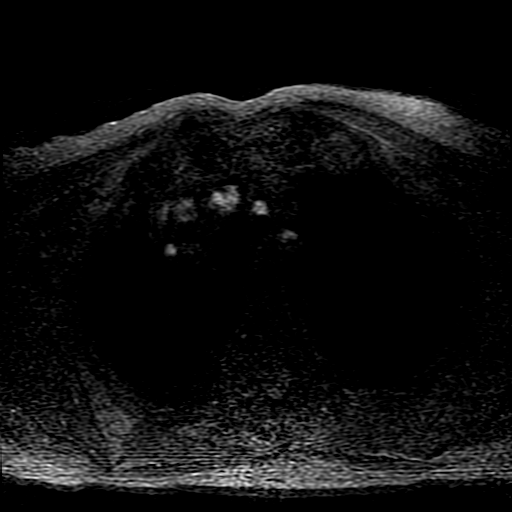
[im 38/76]
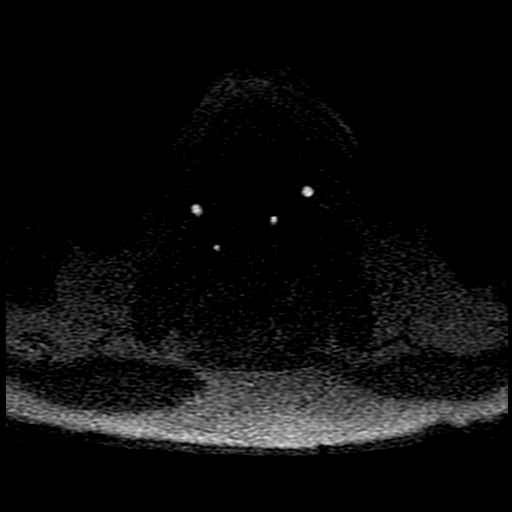
[im 76/76]
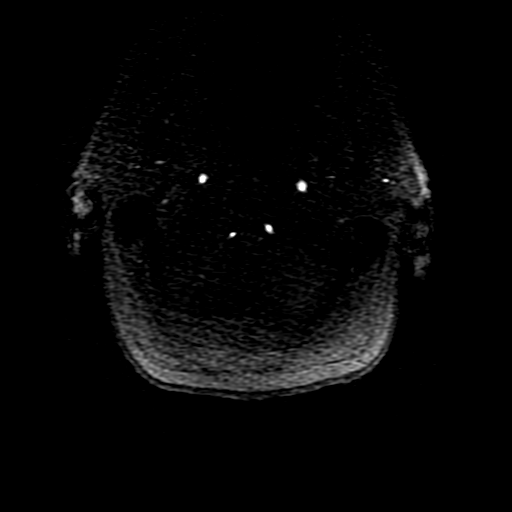

[18 of 48 positions shown; findings below may reference images not displayed]

FINDINGS: MRA HEAD FINDINGS

High-grade stenosis right internal carotid artery cavernous/
supraclinoid junction.

Aplastic A1 segment right anterior cerebral artery.

Moderate narrowing right middle cerebral artery bifurcation.

Moderate narrowing middle cerebral artery branches bilaterally.

Moderate narrowing A2 segment right anterior cerebral artery.

Moderate narrowing distal right vertebral artery.

High-grade stenosis with almost complete occlusion proximal basilar
artery.

Nonvisualized anterior inferior cerebellar arteries.

Marked tandem stenosis posterior cerebral arteries bilaterally most
notable proximally.

No aneurysm visualized.

MRA NECK FINDINGS

Noncontrast imaging was performed secondary to patient's renal
function.

Limited evaluation proximal great vessels.

Carotid bifurcation atherosclerotic type changes. Narrowing of the
proximal internal carotid arteries bilaterally without
hemodynamically significant stenosis on either side.
IMPRESSION: MRA HEAD

Progressive atherosclerotic type changes including:

High-grade stenosis with almost complete occlusion proximal basilar
artery.

High-grade stenosis right internal carotid artery cavernous/
supraclinoid junction.

Moderate narrowing right middle cerebral artery bifurcation.

Moderate narrowing middle cerebral artery branches bilaterally.

Moderate narrowing A2 segment right anterior cerebral artery.

Moderate narrowing distal right vertebral artery.

Nonvisualized anterior inferior cerebellar arteries.

Marked tandem stenosis posterior cerebral arteries bilaterally most
notable proximally.

MRA NECK

Noncontrast imaging was performed secondary to patient's renal
function.

Limited evaluation proximal great vessels.

Narrowing of the proximal internal carotid arteries bilaterally
without hemodynamically significant stenosis on either side.

## 2016-02-16 ENCOUNTER — Telehealth: Payer: Self-pay

## 2016-02-16 NOTE — Telephone Encounter (Signed)
Pt would like Dr Raliegh Ip to return his call. Pt said is personal

## 2016-02-17 ENCOUNTER — Telehealth: Payer: Self-pay | Admitting: Physician Assistant

## 2016-02-17 NOTE — Telephone Encounter (Signed)
New Message:   Please call,question about his last lab results.

## 2016-02-17 NOTE — Telephone Encounter (Signed)
Calling requesting his lab values.  States he needs to know what his Creat was in January. Advised that Creat 1.64.  States he will be seeing another physician and needed to know the results.

## 2016-03-01 ENCOUNTER — Encounter: Payer: Self-pay | Admitting: Neurology

## 2016-03-01 ENCOUNTER — Ambulatory Visit (INDEPENDENT_AMBULATORY_CARE_PROVIDER_SITE_OTHER): Payer: Medicare Other | Admitting: Neurology

## 2016-03-01 VITALS — BP 100/64 | HR 88 | Resp 18 | Wt 147.0 lb

## 2016-03-01 DIAGNOSIS — G3184 Mild cognitive impairment, so stated: Secondary | ICD-10-CM | POA: Diagnosis not present

## 2016-03-01 DIAGNOSIS — I679 Cerebrovascular disease, unspecified: Secondary | ICD-10-CM | POA: Diagnosis not present

## 2016-03-01 NOTE — Progress Notes (Signed)
NEUROLOGY FOLLOW UP OFFICE NOTE  Robert Emery  MD YG:[id number]  HISTORY OF PRESENT ILLNESS: I had the pleasure of seeing Robert Rowe in follow-up in the neurology clinic on 03/01/2016. He is alone today. The patient was last seen 4 months ago. He was initially seen for memory changes, then last November 2016 his family expressed concern about bilateral leg weakness with slurred speech. MRA head and neck showed intracranial stenosis, with significant intracranial stenosis in basilar, right ICA cavernous/supraclinoid junction, moderate narrowing in bilateral ACA, right ACA, distal right vertebral, bilateral PCA stenosis. Recurrent symptoms concerning for hypoperfusion causing recurrent TIAs. He was advised to take Plavix regularly instead of 5 days of the week. He declined lipid-lowering agents. MMSE in December 2016 was 27/30, he was started on Aricept which caused GI symptoms. He reports having vomiting which brought him to the hospital, he was instructed to stop NSAIDs, and has been miserable the past 3-1/2 months due to arthritis. Vomiting resolved. He had pain, particularly in the left foot and could not play golf or walk. This affected his mood and he felt his "mind was not working." He restarted meloxicam last Saturday and states he has not felt that good in months, even his cognition felt better. He feels his memory is "not too bad," he has been told by his wife he repeats himself, he denies getting lost driving, no missed medications or bills. He denies any further episodes of bilateral leg weakness or loss of consciousness. He denies any headaches, dizziness, vision changes, focal numbness/tingling.   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 He reports that he started having recurrent symptoms in 2001, he was driving to his dentist when a peculiar feeling came over  him, "running round in my head," for a couple of minutes. He denied any focal symptoms, he was able to drive without any confusion. He was evaluated at that time with an MRI brain reported as normal. He had another episode in 2004 with a normal EEG. He reports a history of hypoglycemia where he gets shaky, and that his blood glucose levels and BP were unremarkable during these episodes. He did well for several years until 2013 when he had 4 spells of confusion where a sensation of "ideas repeating over and over in his brain" occurred. There is note that he may have had weakness in the right arm with one of these spells. No associated headache. He was evaluated by neurologist [name], routine EEG normal. MRI brain did not show any acute changes, I personally reviewed scan which showed diffuse atrophy with likely ex vacuo ventriculomegaly, MRA head showed mild intracranial atherosclerosis in the left PCA, distal flow preserved. He again had no further episodes for another 3 years until March 2016 while his wife was driving, she asked him to roll his window down. He reports he was taking a nap, as as he was waking up, he was trying to reach for the window with his right hand but could not find it (eyes were closed per patient because he was just waking up), then his wife noticed he seemed to be waving his right hand in the air and was trying to talk but could not get words out. He recalls this episode and denied any confusion. It only lasted a few seconds. The second episode occurred a week or so after, he woke up  from his nap and was again unable to talk for a few seconds. The last episode occurred 02/22/15 while in a restaurant. They report that he had not eaten that evening, he spoke at a meeting, then went to the restaurant after. He recalls feeling sleepy and went to sleep, but his wife reports that he suddenly slumped down then to the wall on his right side, eyes closed for a few seconds. She called his name and  he started to sit up and drank from his cup. His wife reports he was perfectly normal soon after and did not appear confused or drowsy. He denied any associated focal numbness/tingling, or post-event weakness, no headaches, dizziness. He denies any other episodes of speech difficulties. He feels these may be hypoglycemic episodes, and has been advised to use a glucometer in the past but has not done this.   His routine EEG was normal. 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 I had discussed findings with [name] on the phone, likely due to small vessel disease, and controlling vascular risk factors. He is aware of elevated cholesterol but did not want to start any medication. He continues on Plavix taken 5 times a week due to easy bruising and bleeding (when he was having TURP).   On his visit in November 2016, his wife and son reported a change in symptoms over the course of a month. On 08/27/15, he was alone at home watching TV, when he suddenly started "feeling down," then he broke out in a sweat. He felt temporarily confused. He called his wife then called EMS and was brought to [facility] ER, on arrival he was oriented x 3, BP 130/82, HR 78. EKG reported sinus rhythm, multiple premature compexes, vent; borderline repolarization abnormality; no significant change since last tracing. CBG 107, CBC and BMP unremarkable except for creatinine of 1.41. Urinalysis negative. He wanted to go home and was discharged at baseline. His family reports that since then, he has had 8 episodes where he suddenly gets weak and if standing, would start listing to his left. His wife tried to help him one time and they both ended up on the floor. He could not move either leg. His son came one time and could not get him up, it appeared he could not move his legs, so he  crawled to a stool, then was able to get to his knees, then after a few minutes he felt better and stood up and started walking. Last episode was 3 days ago. His wife also notices that his speech would get very slurred when there episodes occur.   PAST MEDICAL HISTORY: Past Medical History  Diagnosis Date  . BENIGN PROSTATIC HYPERTROPHY 04/05/2007  . CHEST DISCOMFORT 04/25/2009  . GERD 12/03/2007  . HYPERLIPIDEMIA 04/05/2007  . Palpitations 02/12/2010  . PEDAL EDEMA 05/01/2008  . TRANSIENT ISCHEMIC ATTACK, HX OF 04/05/2007    ?   Marland Kitchen Hiatal hernia   . Hypothyroidism   . Dysrhythmia     pvc's   . Chronic kidney disease     chronic   . Osteoarth NOS-Unspec 04/05/2007  . Rheumatoid arthritis(714.0) 04/05/2007  . DJD (degenerative joint disease)   . 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 Chronic diastolic CHF (congestive heart failure) ([place])     a. Echo 12/16: mild LVH, EF 55-60%,  no RWMA, Gr 1 DD, mild AI, MAC, mild LAE, normal RVF, PASP 37 mmHg  . History of nuclear stress test     Myoview 1/17: EF 53%, normal perfusion; Low Risk    MEDICATIONS: Current Outpatient Prescriptions on File Prior to Visit  Medication Sig Dispense Refill  . clopidogrel (PLAVIX) 75 MG tablet Take 1 tablet (75 mg total) by mouth daily. 90 tablet 1  . furosemide (LASIX) 40 MG tablet Take 40 mg by mouth daily.  11  . hydrocortisone 2.5 % cream APPLY TOPICALLY ONCE TO AFFECTED AREA  AT BEDTIME.  6  . levothyroxine (SYNTHROID, LEVOTHROID) 50 MCG tablet TAKE 1 TABLET (50 MCG TOTAL) BY MOUTH DAILY. 90 tablet 3  . methotrexate (RHEUMATREX) 2.5 MG tablet Take 3 tablets (7.5 mg total) by mouth once a week. Take 7.5 mg once a week on Mondays 12 tablet 1  . metoprolol succinate (TOPROL-XL) 25 MG 24 hr tablet Take 1 tablet (25 mg total) by mouth at bedtime. 30 tablet 6  . nitroGLYCERIN (NITROSTAT) 0.4 MG SL tablet Place 0.4 mg under the tongue every 5 (five) minutes as needed (ESOPHEAGEAL SPASPMS).    .  pantoprazole (PROTONIX) 40 MG tablet Take 1 tablet (40 mg total) by mouth daily. 90 tablet 3   No current facility-administered medications on file prior to visit.    ALLERGIES: Allergies  Allergen Reactions  . Sulfonamide Derivatives     Patient does not remember allergy symptoms    FAMILY HISTORY: Family History  Problem Relation Age of Onset  . Breast cancer Mother   . Lymphoma Brother   . Heart disease Father   . Heart attack Father   . Stroke Neg Hx   . Hypertension Neg Hx     SOCIAL HISTORY: Social History   Social History  . Marital Status: Married    Spouse Name: N/A  . Number of Children: N/A  . Years of Education: N/A   Occupational History  . Not on file.   Social History Main Topics  . Smoking status: Former Smoker    Quit date: 08/13/1953  . Smokeless tobacco: Never Used  . Alcohol Use: 0.6 oz/week    1 Shots of liquor per week     Comment: patient has given up alcohol  . Drug Use: No  . Sexual Activity: Not on file   Other Topics Concern  . Not on file   Social History Narrative    REVIEW OF SYSTEMS: Constitutional: No fevers, chills, or sweats, no generalized fatigue, change in appetite Eyes: No visual changes, double vision, eye pain Ear, nose and throat: No hearing loss, ear pain, nasal congestion, sore throat Cardiovascular: No chest pain, palpitations Respiratory:  No shortness of breath at rest or with exertion, wheezes GastrointestinaI: No nausea, vomiting, diarrhea, abdominal pain, fecal incontinence Genitourinary:  No dysuria, urinary retention or frequency Musculoskeletal:  + neck pain, back pain Integumentary: No rash, pruritus, skin lesions Neurological: as above Psychiatric: No depression, insomnia, anxiety Endocrine: No palpitations, fatigue, diaphoresis, mood swings, change in appetite, change in weight, increased thirst Hematologic/Lymphatic:  No anemia, purpura, petechiae. Allergic/Immunologic: no itchy/runny eyes, nasal  congestion, recent allergic reactions, rashes  PHYSICAL EXAM: Filed Vitals:   03/01/16 0841  BP: 100/64  Pulse: 88  Resp: 18   General: No acute distress, appears fatigued today, not his usual upbeat self Head:  Normocephalic/atraumatic Neck: supple, no paraspinal tenderness, full range of motion Heart:  Regular rate and rhythm Lungs:  Clear to auscultation bilaterally  Back: No paraspinal tenderness Skin/Extremities: No rash, no edema Neurological Exam: alert and oriented to person, place, and time. No aphasia or dysarthria. Fund of knowledge is appropriate.  Recent and remote memory are intact. 2/3 delayed recall.  Attention and concentration are normal.    Able to name objects and repeat phrases. Cranial nerves: Pupils equal, round, reactive to light.  Extraocular movements intact with no nystagmus. Visual fields full. Facial sensation intact. No facial asymmetry. Tongue, uvula, palate midline.  Motor: Bulk and tone normal, muscle strength 5/5 throughout with no pronator drift.  Sensation to light touch intact.  No extinction to double simultaneous stimulation.  Deep tendon reflexes 2+ throughout, toes downgoing.  Finger to nose testing intact.  Gait slow and cautious due to arthritis pain.   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 He has had an unremarkable MRI brain and routine EEG in 2013, symptom-free until March 2016, with the last episode, he apparently very briefly slumped down to his right side. His routine EEG was normal. 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 No further similar spells. In November, his family reported episodes of bilateral leg weakness and slurred speech. MRA head and neck showed significant intracranial stenosis in the anterior and  posterior circulation. We discussed maximum medical management, he continues on Plavix and BP control. He declined lipid-lowering agents. He continues to have memory changes, did not tolerate Aricept. We discussed other options, including Exelon patch, he would like to hold off for now until he gets the arthritis better controlled. He will follow-up in 5 months and knows to call for any changes.  Thank you for allowing me to participate in his care.  Please do not hesitate to call for any questions or concerns.  The duration of this appointment visit was 15 minutes of face-to-face time with the patient.  Greater than 50% of this time was spent in counseling, explanation of diagnosis, planning of further management, and coordination of care.   Ellouise Newer, M.D.   CC: Dr. Burnice Logan

## 2016-03-01 NOTE — Patient Instructions (Signed)
1. Continue all your medications 2. Wishing you well with arthritis treatment! 3. Follow-up in 5 months

## 2016-03-02 ENCOUNTER — Other Ambulatory Visit: Payer: Self-pay | Admitting: Internal Medicine

## 2016-03-02 MED ORDER — PANTOPRAZOLE SODIUM 40 MG PO TBEC: 40.0000 mg | DELAYED_RELEASE_TABLET | Freq: Every day | ORAL | Status: DC

## 2016-03-03 DIAGNOSIS — M150 Primary generalized (osteo)arthritis: Secondary | ICD-10-CM | POA: Diagnosis not present

## 2016-03-03 DIAGNOSIS — M0609 Rheumatoid arthritis without rheumatoid factor, multiple sites: Secondary | ICD-10-CM | POA: Diagnosis not present

## 2016-03-17 ENCOUNTER — Telehealth: Payer: Self-pay | Admitting: Internal Medicine

## 2016-03-17 NOTE — Telephone Encounter (Signed)
Pt would like you to call him. He has a medical question. Pt aware you are out until Mon, 5/1.

## 2016-03-21 NOTE — Telephone Encounter (Signed)
Patient unable to recall why he called last week.  Complains of poor memory and chronic fatigue

## 2016-03-24 ENCOUNTER — Telehealth: Payer: Self-pay | Admitting: Internal Medicine

## 2016-03-24 ENCOUNTER — Other Ambulatory Visit (INDEPENDENT_AMBULATORY_CARE_PROVIDER_SITE_OTHER): Payer: Medicare Other

## 2016-03-24 DIAGNOSIS — R7989 Other specified abnormal findings of blood chemistry: Secondary | ICD-10-CM | POA: Diagnosis not present

## 2016-03-24 DIAGNOSIS — Z0000 Encounter for general adult medical examination without abnormal findings: Secondary | ICD-10-CM

## 2016-03-24 LAB — LIPID PANEL
Cholesterol: 255 mg/dL — ABNORMAL HIGH (ref 0–200)
HDL: 37.9 mg/dL — ABNORMAL LOW (ref >39.00)
NonHDL: 217.41
Total CHOL/HDL Ratio: 7
Triglycerides: 327 mg/dL — ABNORMAL HIGH (ref 0.0–149.0)
VLDL: 65.4 mg/dL — ABNORMAL HIGH (ref 0.0–40.0)

## 2016-03-24 LAB — CBC WITH DIFFERENTIAL/PLATELET
Basophils Absolute: 0.1 10*3/uL (ref 0.0–0.1)
Basophils Relative: 0.6 % (ref 0.0–3.0)
Eosinophils Absolute: 0.1 10*3/uL (ref 0.0–0.7)
Eosinophils Relative: 0.8 % (ref 0.0–5.0)
HCT: 44.7 % (ref 39.0–52.0)
Hemoglobin: 15 g/dL (ref 13.0–17.0)
Lymphocytes Relative: 19 % (ref 12.0–46.0)
Lymphs Abs: 1.6 10*3/uL (ref 0.7–4.0)
MCHC: 33.6 g/dL (ref 30.0–36.0)
MCV: 98.5 fl (ref 78.0–100.0)
Monocytes Absolute: 0.8 10*3/uL (ref 0.1–1.0)
Monocytes Relative: 9.6 % (ref 3.0–12.0)
Neutro Abs: 6 10*3/uL (ref 1.4–7.7)
Neutrophils Relative %: 70 % (ref 43.0–77.0)
Platelets: 234 10*3/uL (ref 150.0–400.0)
RBC: 4.54 Mil/uL (ref 4.22–5.81)
RDW: 14 % (ref 11.5–15.5)
WBC: 8.6 10*3/uL (ref 4.0–10.5)

## 2016-03-24 LAB — BASIC METABOLIC PANEL
BUN: 21 mg/dL (ref 6–23)
CO2: 28 mEq/L (ref 19–32)
Calcium: 9.5 mg/dL (ref 8.4–10.5)
Chloride: 100 mEq/L (ref 96–112)
Creatinine, Ser: 1.52 mg/dL — ABNORMAL HIGH (ref 0.40–1.50)
GFR: 45.47 mL/min — ABNORMAL LOW (ref >60.00)
Glucose, Bld: 95 mg/dL (ref 70–99)
Potassium: 4.3 mEq/L (ref 3.5–5.1)
Sodium: 137 mEq/L (ref 135–145)

## 2016-03-24 LAB — HEPATIC FUNCTION PANEL
ALT: 11 U/L (ref 0–53)
AST: 14 U/L (ref 0–37)
Albumin: 4.1 g/dL (ref 3.5–5.2)
Alkaline Phosphatase: 72 U/L (ref 39–117)
Bilirubin, Direct: 0.1 mg/dL (ref 0.0–0.3)
Total Bilirubin: 0.6 mg/dL (ref 0.2–1.2)
Total Protein: 6.3 g/dL (ref 6.0–8.3)

## 2016-03-24 LAB — LDL CHOLESTEROL, DIRECT: Direct LDL: 175 mg/dL

## 2016-03-24 LAB — TSH: TSH: 3.05 u[IU]/mL (ref 0.35–4.50)

## 2016-03-24 NOTE — Telephone Encounter (Signed)
Pt called back, said Dr.K is ordering some labs on him and he will be coming by today to have drawn. Told him okay will add to schedule. Norma added pt to lab schedule.

## 2016-03-24 NOTE — Telephone Encounter (Signed)
Pt ask Butch Penny if you would give him a call

## 2016-03-29 ENCOUNTER — Telehealth: Payer: Self-pay | Admitting: Internal Medicine

## 2016-03-29 DIAGNOSIS — S46011D Strain of muscle(s) and tendon(s) of the rotator cuff of right shoulder, subsequent encounter: Secondary | ICD-10-CM | POA: Diagnosis not present

## 2016-03-29 DIAGNOSIS — S46012A Strain of muscle(s) and tendon(s) of the rotator cuff of left shoulder, initial encounter: Secondary | ICD-10-CM | POA: Diagnosis not present

## 2016-03-29 NOTE — Telephone Encounter (Signed)
Spoke to pt, told him labs all normal except Cholesterol has increased to 255 and creatinine is stable and has improved per Dr.K. Pt verbalized understanding.

## 2016-03-29 NOTE — Telephone Encounter (Signed)
All normal except cholesterol has increased to 255  Creatinine is stable and has improved

## 2016-03-29 NOTE — Telephone Encounter (Signed)
Pt would like blood work results from last week

## 2016-03-31 ENCOUNTER — Telehealth: Payer: Self-pay | Admitting: General Practice

## 2016-03-31 ENCOUNTER — Other Ambulatory Visit: Payer: Self-pay | Admitting: General Practice

## 2016-03-31 MED ORDER — NEOMYCIN-POLYMYXIN-DEXAMETH 0.1 % OP SUSP: 2.0000 [drp] | Freq: Four times a day (QID) | OPHTHALMIC | Status: DC

## 2016-03-31 NOTE — Telephone Encounter (Signed)
ok 

## 2016-04-01 ENCOUNTER — Encounter: Payer: Self-pay | Admitting: Internal Medicine

## 2016-04-01 ENCOUNTER — Ambulatory Visit: Payer: Medicare Other | Admitting: Internal Medicine

## 2016-04-01 DIAGNOSIS — S2232XD Fracture of one rib, left side, subsequent encounter for fracture with routine healing: Secondary | ICD-10-CM | POA: Diagnosis not present

## 2016-04-07 ENCOUNTER — Other Ambulatory Visit: Payer: Self-pay | Admitting: *Deleted

## 2016-04-07 DIAGNOSIS — Z0000 Encounter for general adult medical examination without abnormal findings: Secondary | ICD-10-CM | POA: Diagnosis not present

## 2016-04-07 DIAGNOSIS — N3941 Urge incontinence: Secondary | ICD-10-CM | POA: Diagnosis not present

## 2016-04-07 MED ORDER — CLOPIDOGREL BISULFATE 75 MG PO TABS: 75.0000 mg | ORAL_TABLET | Freq: Every day | ORAL | Status: DC

## 2016-04-11 DIAGNOSIS — S46011D Strain of muscle(s) and tendon(s) of the rotator cuff of right shoulder, subsequent encounter: Secondary | ICD-10-CM | POA: Diagnosis not present

## 2016-04-11 DIAGNOSIS — S2232XD Fracture of one rib, left side, subsequent encounter for fracture with routine healing: Secondary | ICD-10-CM | POA: Diagnosis not present

## 2016-04-11 DIAGNOSIS — S46012D Strain of muscle(s) and tendon(s) of the rotator cuff of left shoulder, subsequent encounter: Secondary | ICD-10-CM | POA: Diagnosis not present

## 2016-04-13 DIAGNOSIS — M25512 Pain in left shoulder: Secondary | ICD-10-CM | POA: Diagnosis not present

## 2016-04-13 DIAGNOSIS — M7542 Impingement syndrome of left shoulder: Secondary | ICD-10-CM | POA: Diagnosis not present

## 2016-04-13 DIAGNOSIS — M25612 Stiffness of left shoulder, not elsewhere classified: Secondary | ICD-10-CM | POA: Diagnosis not present

## 2016-05-04 ENCOUNTER — Encounter: Payer: Self-pay | Admitting: Internal Medicine

## 2016-05-04 ENCOUNTER — Ambulatory Visit (INDEPENDENT_AMBULATORY_CARE_PROVIDER_SITE_OTHER): Payer: Medicare Other | Admitting: Internal Medicine

## 2016-05-04 VITALS — BP 149/91 | HR 83 | Ht 62.0 in | Wt 150.2 lb

## 2016-05-04 DIAGNOSIS — E785 Hyperlipidemia, unspecified: Secondary | ICD-10-CM | POA: Diagnosis not present

## 2016-05-04 DIAGNOSIS — I493 Ventricular premature depolarization: Secondary | ICD-10-CM | POA: Diagnosis not present

## 2016-05-04 DIAGNOSIS — R0600 Dyspnea, unspecified: Secondary | ICD-10-CM

## 2016-05-04 NOTE — Progress Notes (Signed)
Cardiology Office Note   Date:  05/04/2016   ID:  Robert Emery  MD, DOB December 31, 1917, MRN [id number]  PCP:  Nyoka Cowden, MD  Cardiologist: Darlin Coco MD  Chief Complaint  Patient presents with  . New Patient (Initial Visit)    lightheaded; frequently. edema; in ankles and legs      History of Present Illness: Robert Emery  MD is a 82 y.o. male who presents for scheduled follow-up office visit.  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 He has had numerous endoscopies and esophageal dilatations in the past. He does have a history of chest pain often triggered by alcohol ingestion or overeating. He had chest pain at [facility] in 2014. Stress test there was negative. Last seen by Dr. [name] 8/16.  Admitted 11/15/15 with chest pain. He had minimal relief with nitroglycerin at home. Cardiac enzymes remained negative. Telemetry was unremarkable. PVCs were noted.   He was seen 12/16 for volume excess. He was placed on Lasix. Echo demonstrated normal LVF, mild diastolic dysfunction, mild to mod pulmonary HTN.   He has been worried about his renal function.  He has tried to stay off of meloxicam as much as possible and he is also trying to cut back on his Protonix.  He has furosemide 40 mg on hand but does not take it every day.  He skips it if he is traveling.  The patient had blood work on 01/14/16 which showed that his renal function is stable with creatinine 1.6 to and his B natruretic peptide was normal at 36.5.  He has had mild bilateral peripheral edema which we have suspected is secondary to venous insufficiency rather than to ongoing congestive heart failure..  05/04/2016  I the pleasure of meeting [name] today in the office. He is a former patient of Dr. 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 He was one of  the original medical students at [employer]. He has no known cardiac history but does have a history of high cholesterol and possible diastolic dysfunction. He takes Lasix as needed for shortness of breath and swelling. He tends to use it as needed.   Past Medical History  Diagnosis Date  . BENIGN PROSTATIC HYPERTROPHY 04/05/2007  . CHEST DISCOMFORT 04/25/2009  . GERD 12/03/2007  . HYPERLIPIDEMIA 04/05/2007  . Palpitations 02/12/2010  . PEDAL EDEMA 05/01/2008  . TRANSIENT ISCHEMIC ATTACK, HX OF 04/05/2007    ?   Marland Kitchen Hiatal hernia   . Hypothyroidism   . Dysrhythmia     pvc's   . Chronic kidney disease     chronic   . Osteoarth NOS-Unspec 04/05/2007  . Rheumatoid arthritis(714.0) 04/05/2007  . DJD (degenerative joint disease)   . 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 Chronic diastolic CHF (congestive heart failure) ([place])     a. Echo 12/16: mild LVH, EF 55-60%, no RWMA, Gr 1 DD, mild AI, MAC, mild LAE, normal RVF, PASP 37 mmHg  . History of nuclear stress test     Myoview 1/17: EF 53%, normal perfusion; Low Risk    Past Surgical History  Procedure Laterality Date  . Hemorrhoid surgery    . Hernia repair      inguinal x5  . Cataract extraction  2001    bilateral  .  Knee surgery  arthroscopic    left  . Shoulder surgery  few years ago    left  . Prostate cryoablation      2000  . Esophagogastroduodenoscopy N/A 03/12/2013    Procedure: ESOPHAGOGASTRODUODENOSCOPY (EGD);  Surgeon: Inda Castle, MD;  Location: [facility];  Service: Endoscopy;  Laterality: N/A;  . Balloon dilation N/A 03/12/2013    Procedure: BALLOON DILATION;  Surgeon: Inda Castle, MD;  Location: [facility];  Service: Endoscopy;  Laterality: N/A;  . Transurethral resection of prostate N/A 03/19/2015    Procedure: TRANSURETHRAL RESECTION OF THE PROSTATE (TURP);  Surgeon: Carolan Clines, MD;  Location: [facility];  Service: Urology;  Laterality: N/A;  . Cystoscopy with urethral  dilatation N/A 09/24/2015    Procedure: CYSTOSCOPY WITH COOK BALLOON DILATATION OF URETHRAL STRICTURE ;  Surgeon: Carolan Clines, MD;  Location: [facility];  Service: Urology;  Laterality: N/A;     Current Outpatient Prescriptions  Medication Sig Dispense Refill  . clopidogrel (PLAVIX) 75 MG tablet Take 1 tablet (75 mg total) by mouth daily. 90 tablet 1  . furosemide (LASIX) 40 MG tablet Take 20 mg by mouth daily.   11  . hydrocortisone 2.5 % cream APPLY TOPICALLY ONCE TO AFFECTED AREA  AT BEDTIME.  6  . levothyroxine (SYNTHROID, LEVOTHROID) 50 MCG tablet TAKE 1 TABLET (50 MCG TOTAL) BY MOUTH DAILY. 90 tablet 3  . meloxicam (MOBIC) 7.5 MG tablet 1/2 - 1 tablet Once every other day Orally 90 days  11  . metoprolol succinate (TOPROL-XL) 25 MG 24 hr tablet Take 1 tablet (25 mg total) by mouth at bedtime. 30 tablet 6  . neomycin-polymyxin-dexamethasone (MAXITROL) 0.1 % ophthalmic suspension Place 2 drops into both eyes 4 (four) times daily. 5 mL 3  . nitroGLYCERIN (NITROSTAT) 0.4 MG SL tablet Place 0.4 mg under the tongue every 5 (five) minutes as needed (ESOPHEAGEAL SPASPMS). Reported on 03/01/2016    . pantoprazole (PROTONIX) 40 MG tablet Take 1 tablet (40 mg total) by mouth daily. 90 tablet 3   No current facility-administered medications for this visit.    Allergies:   Sulfonamide derivatives    Social History:  The patient  reports that he quit smoking about 62 years ago. He has never used smokeless tobacco. He reports that he drinks about 0.6 oz of alcohol per week. He reports that he does not use illicit drugs.   Family History:  The patient's family history includes Breast cancer in his mother; Heart attack in his father; Heart disease in his father; Lymphoma in his brother. There is no history of Stroke or Hypertension.    ROS:  Please see the history of present illness.   Otherwise, review of systems are positive for none.   All other systems are reviewed and negative.    PHYSICAL  EXAM: VS:  BP 149/91 mmHg  Pulse 83  Ht 5\' 2"  (1.575 m)  Wt 150 lb 3.2 oz (68.13 kg)  BMI 27.46 kg/m2 , BMI Body mass index is 27.46 kg/(m^2). GEN: Well nourished, well developed, in no acute distress HEENT: normal Neck: no JVD, carotid bruits, or masses Cardiac: RRR; no murmurs, rubs, or gallops,1+ edema bilat. Respiratory:  clear to auscultation bilaterally, normal work of breathing GI: soft, nontender, nondistended, + BS MS: no deformity or atrophy Skin: warm and dry, no rash Neuro:  Strength and sensation are intact Psych: euthymic mood, full affect   EKG:   Sinus rhythm with PVCs at 83   Recent Labs:  10/30/2015: Pro B Natriuretic peptide (BNP) 139.0* 11/15/2015: Magnesium 1.9 03/24/2016: ALT 11; BUN 21; Creatinine, Ser 1.52*; Hemoglobin 15.0; Platelets 234.0; Potassium 4.3; Sodium 137; TSH 3.05    Lipid Panel    Component Value Date/Time   CHOL 255* 03/24/2016 1328   TRIG 327.0* 03/24/2016 1328   HDL 37.90* 03/24/2016 1328   CHOLHDL 7 03/24/2016 1328   VLDL 65.4* 03/24/2016 1328   LDLCALC 116* 11/15/2015 0421   LDLDIRECT 175.0 03/24/2016 1328      Wt Readings from Last 3 Encounters:  05/04/16 150 lb 3.2 oz (68.13 kg)  03/01/16 147 lb (66.679 kg)  01/18/16 147 lb 12.8 oz (67.042 kg)      ASSESSMENT AND PLAN:  1. Elevated BP - acceptable.  Continue current medication.  Continue beta blocker and furosemide.   2. Chronic Diastolic CHF - Recent echo with normal LVF and mild diastolic dysfunction. Myoview was done and this was low risk with normal perfusion. He will continue on furosemide 40 mg daily.  His normal B natruretic peptide is consistent with a lot of his edema being secondary to venous insufficiency.  3. CKD - serum creatinine on 01/14/16 was 1.62, stable.   4. PVCs - Largely asymptomatic. He remains on beta blocker therapy.  No PVCs noted today.   Current medicines are reviewed at length with the patient today.  The patient does not have concerns  regarding medicines.  The following changes have been made:  no change  Labs/ tests ordered today include:   No orders of the defined types were placed in this encounter.    Pixie Casino, MD, [facility] Attending Cardiologist [facility]   05/04/2016 12:49 PM

## 2016-05-04 NOTE — Patient Instructions (Signed)
Your physician wants you to follow-up in: 6 months with Dr. Hilty. You will receive a reminder letter in the mail two months in advance. If you don't receive a letter, please call our office to schedule the follow-up appointment.    

## 2016-05-06 ENCOUNTER — Other Ambulatory Visit: Payer: Self-pay | Admitting: *Deleted

## 2016-05-06 MED ORDER — METOPROLOL SUCCINATE ER 25 MG PO TB24: 25.0000 mg | ORAL_TABLET | Freq: Every day | ORAL | Status: DC

## 2016-05-30 ENCOUNTER — Telehealth: Payer: Self-pay | Admitting: *Deleted

## 2016-05-30 NOTE — Telephone Encounter (Signed)
Received call from pt asking for Rx for St[employer] for tax exemption. Rx filled out and signed by Dr.K and faxed to [employer] at [phone].

## 2016-06-01 DIAGNOSIS — N35010 Post-traumatic urethral stricture, male, meatal: Secondary | ICD-10-CM | POA: Diagnosis not present

## 2016-06-01 DIAGNOSIS — R351 Nocturia: Secondary | ICD-10-CM | POA: Diagnosis not present

## 2016-06-01 DIAGNOSIS — N401 Enlarged prostate with lower urinary tract symptoms: Secondary | ICD-10-CM | POA: Diagnosis not present

## 2016-06-07 ENCOUNTER — Emergency Department (HOSPITAL_COMMUNITY): Payer: Medicare Other

## 2016-06-07 ENCOUNTER — Emergency Department (HOSPITAL_COMMUNITY)
Admission: EM | Admit: 2016-06-07 | Discharge: 2016-06-08 | Disposition: A | Discharge status: Home / Self Care | Payer: Medicare Other | Attending: Emergency Medicine | Admitting: Emergency Medicine

## 2016-06-07 ENCOUNTER — Encounter (HOSPITAL_COMMUNITY): Payer: Self-pay | Admitting: Emergency Medicine

## 2016-06-07 DIAGNOSIS — I5032 Chronic diastolic (congestive) heart failure: Secondary | ICD-10-CM | POA: Diagnosis not present

## 2016-06-07 DIAGNOSIS — Z7902 Long term (current) use of antithrombotics/antiplatelets: Secondary | ICD-10-CM | POA: Insufficient documentation

## 2016-06-07 DIAGNOSIS — Y92009 Unspecified place in unspecified non-institutional (private) residence as the place of occurrence of the external cause: Secondary | ICD-10-CM | POA: Diagnosis not present

## 2016-06-07 DIAGNOSIS — Z87891 Personal history of nicotine dependence: Secondary | ICD-10-CM | POA: Diagnosis not present

## 2016-06-07 DIAGNOSIS — Y999 Unspecified external cause status: Secondary | ICD-10-CM | POA: Insufficient documentation

## 2016-06-07 DIAGNOSIS — W228XXA Striking against or struck by other objects, initial encounter: Secondary | ICD-10-CM | POA: Insufficient documentation

## 2016-06-07 DIAGNOSIS — S51812A Laceration without foreign body of left forearm, initial encounter: Secondary | ICD-10-CM

## 2016-06-07 DIAGNOSIS — Z8673 Personal history of transient ischemic attack (TIA), and cerebral infarction without residual deficits: Secondary | ICD-10-CM | POA: Diagnosis not present

## 2016-06-07 DIAGNOSIS — Z79899 Other long term (current) drug therapy: Secondary | ICD-10-CM | POA: Diagnosis not present

## 2016-06-07 DIAGNOSIS — N189 Chronic kidney disease, unspecified: Secondary | ICD-10-CM | POA: Diagnosis not present

## 2016-06-07 DIAGNOSIS — Y939 Activity, unspecified: Secondary | ICD-10-CM | POA: Diagnosis not present

## 2016-06-07 DIAGNOSIS — T1490XA Injury, unspecified, initial encounter: Secondary | ICD-10-CM

## 2016-06-07 MED ORDER — TETANUS-DIPHTH-ACELL PERTUSSIS 5-2.5-18.5 LF-MCG/0.5 IM SUSP
0.5000 mL | Freq: Once | INTRAMUSCULAR | Status: AC
  Administered 2016-06-07: 0.5 mL via INTRAMUSCULAR
  Filled 2016-06-07: qty 0.5

## 2016-06-07 MED ORDER — LIDOCAINE-EPINEPHRINE 1 %-1:100000 IJ SOLN
20.0000 mL | Freq: Once | INTRAMUSCULAR | Status: AC
  Administered 2016-06-07: 20 mL
  Filled 2016-06-07: qty 1

## 2016-06-07 NOTE — ED Provider Notes (Signed)
CSN: [id number]     Arrival date & time 06/07/16  2044 History  By signing my name below, I, Robert Rowe, attest that this documentation has been prepared under the direction and in the presence of Robert Iles, MD. Electronically Signed: Altamease Rowe, ED Scribe. 06/08/2016. 1:13 AM   Chief Complaint  Patient presents with  . Extremity Laceration   The history is provided by the patient. No language interpreter was used.   Robert Rowe is a 82 y.o. male who presents to the Emergency Department complaining of a laceration at the left forearm with onset this evening at home after a fall. Pt states that he fell and scraped his arm on a metal railing with no head injury or LOC.  Pt denies change in sensation in the left hand. LAst tetanus is unknown. No other complaints.  Past Medical History  Diagnosis Date  . BENIGN PROSTATIC HYPERTROPHY 04/05/2007  . CHEST DISCOMFORT 04/25/2009  . GERD 12/03/2007  . HYPERLIPIDEMIA 04/05/2007  . Palpitations 02/12/2010  . PEDAL EDEMA 05/01/2008  . TRANSIENT ISCHEMIC ATTACK, HX OF 04/05/2007    ?   Marland Kitchen Hiatal hernia   . Hypothyroidism   . Dysrhythmia     pvc's   . Chronic kidney disease     chronic   . Osteoarth NOS-Unspec 04/05/2007  . Rheumatoid arthritis(714.0) 04/05/2007  . DJD (degenerative joint disease)   . 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 Chronic diastolic CHF (congestive heart failure) ([place])     a. Echo 12/16: mild LVH, EF 55-60%, no RWMA, Gr 1 DD, mild AI, MAC, mild LAE, normal RVF, PASP 37 mmHg  . History of nuclear stress test     Myoview 1/17: EF 53%, normal perfusion; Low Risk   Past Surgical History  Procedure Laterality Date  . Hemorrhoid surgery    . Hernia repair      inguinal x5  . Cataract extraction  2001    bilateral  . Knee surgery  arthroscopic    left  . Shoulder surgery  few years ago    left  . Prostate cryoablation      2000  . Esophagogastroduodenoscopy N/A 03/12/2013     Procedure: ESOPHAGOGASTRODUODENOSCOPY (EGD);  Surgeon: Robert Castle, MD;  Location: [facility];  Service: Endoscopy;  Laterality: N/A;  . Balloon dilation N/A 03/12/2013    Procedure: BALLOON DILATION;  Surgeon: Robert Castle, MD;  [facility];  Service: Endoscopy;  Laterality: N/A;  . Transurethral resection of prostate N/A 03/19/2015    Procedure: TRANSURETHRAL RESECTION OF THE PROSTATE (TURP);  Surgeon: Robert Clines, MD;  Location: [facility];  Service: Urology;  Laterality: N/A;  . Cystoscopy with urethral dilatation N/A 09/24/2015    Procedure: CYSTOSCOPY WITH COOK BALLOON DILATATION OF URETHRAL STRICTURE ;  Surgeon: Robert Clines, MD;  [facility]: [facility];  Service: Urology;  Laterality: N/A;   Family History  Problem Relation Age of Onset  . Breast cancer Mother   . Lymphoma Brother   . Heart disease Father   . Heart attack Father   . Stroke Neg Hx   . Hypertension Neg Hx    Social History  Substance Use Topics  . Smoking status: Former Smoker    Quit date: 08/13/1953  . Smokeless tobacco: Never Used  . Alcohol Use: Yes    Review of Systems  10 Systems reviewed and all are negative for acute change except as noted in the HPI.  Allergies  Sulfonamide derivatives  Home Medications   Prior to Admission medications   Medication Sig Start Date End Date Taking? Authorizing Provider  clopidogrel (PLAVIX) 75 MG tablet Take 1 tablet (75 mg total) by mouth daily. 04/07/16  Yes Marletta Lor, MD  furosemide (LASIX) 40 MG tablet Take 20 mg by mouth daily.  01/08/16  Yes Historical Provider, MD  levothyroxine (SYNTHROID, LEVOTHROID) 50 MCG tablet TAKE 1 TABLET (50 MCG TOTAL) BY MOUTH DAILY. 09/22/15  Yes Marletta Lor, MD  metoprolol succinate (TOPROL-XL) 25 MG 24 hr tablet Take 1 tablet (25 mg total) by mouth at bedtime. 05/06/16  Yes Pixie Casino, MD  nitroGLYCERIN (NITROSTAT) 0.4 MG SL tablet Place 0.4 mg under the tongue every 5 (five) minutes as  needed (ESOPHEAGEAL SPASPMS). Reported on 03/01/2016   Yes Historical Provider, MD  pantoprazole (PROTONIX) 40 MG tablet Take 1 tablet (40 mg total) by mouth daily. 03/02/16  Yes Marletta Lor, MD  neomycin-polymyxin-dexamethasone (MAXITROL) 0.1 % ophthalmic suspension Place 2 drops into both eyes 4 (four) times daily. Patient not taking: Reported on 06/08/2016 03/31/16   Marletta Lor, MD   BP 145/104 mmHg  Pulse 99  Temp(Src) 97.6 F (36.4 C) (Oral)  Resp 16  SpO2 93% Physical Exam  Constitutional: He is oriented to person, place, and time. He appears well-developed and well-nourished. No distress.  HENT:  Head: Normocephalic and atraumatic.  Eyes: Conjunctivae are normal.  Neck: Neck supple.  Cardiovascular: Normal rate, regular rhythm, normal heart sounds and intact distal pulses.   No murmur heard. Pulmonary/Chest: Effort normal and breath sounds normal.  Abdominal: Soft. Bowel sounds are normal. He exhibits no distension. There is no tenderness.  Musculoskeletal: Normal range of motion. He exhibits no edema.  Normal sensation L hand and normal grip strength  Neurological: He is alert and oriented to person, place, and time.  Fluent speech  Skin: Skin is warm and dry.  9cm deep skin tear to dorsal L forearm with adipose tissue exposure  Psychiatric: He has a normal mood and affect. Judgment normal.  Nursing note and vitals reviewed.   ED Course  Procedures (including critical care time) DIAGNOSTIC STUDIES: Oxygen Saturation is 93% on RA, adequate by my interpretation.    COORDINATION OF CARE: 11:30 PM Discussed treatment plan which includes left forearm XR and laceration repair with pt at bedside and pt agreed to plan.  LACERATION REPAIR PROCEDURE NOTE The patient's identification was confirmed and consent was obtained. This procedure was performed by Robert Iles, MD at 1:09 AM. Site: left forearm Sterile procedures observed Anesthetic used (type and  amt): 6 ml 1% lidocaine with epi Suture type/size:4-0 vicryl (subcutaneous), 4-0 Ethilon (skin) Length:9 cm # of Sutures: 2 deep and 11 superficial Technique:simple interrupted and vertical matress Complexity: moderate Required mild debridement Antibx ointment and steri strips applied Tetanus ordered Site anesthetized, irrigated with NS, explored without evidence of foreign body, mildly debrided, wound well approximated, site covered with dry, sterile dressing.  Patient tolerated procedure well without complications. Instructions for care discussed verbally and patient provided with additional written instructions for homecare and f/u.   Labs Review Labs Reviewed - No data to display  Imaging Review No results found.    EKG Interpretation None     Medications  Tdap (BOOSTRIX) injection 0.5 mL (0.5 mLs Intramuscular Given 06/07/16 2352)  lidocaine-EPINEPHrine (XYLOCAINE W/EPI) 1 %-1:100000 (with pres) injection 20 mL (20 mLs Other Given 06/07/16 2351)    MDM  Final diagnoses:  Forearm laceration, left, initial encounter   Patient with large forearm skin tear/laceration after he bumped against a metal railing at home. No fall or other injuries. He had a large, deep skin tear on his left forearm with adipose tissue exposure. Because of the depth of the wound, repaired with sutures after irrigation and cleaning. See procedure note for details. Tetanus updated. Reviewed supportive care instructions and return precautions including any signs of infection. Instructed to follow-up with PCP in approximately 10 days for suture removal. Patient voiced understanding and was discharged in satisfactory condition.  I personally performed the services described in this documentation, which was scribed in my presence. The recorded information has been reviewed and is accurate.   Robert Iles, MD 06/08/16 0630

## 2016-06-07 NOTE — ED Notes (Signed)
Pt. presents with skin tear at left forearm approx. 3" with minimal bleeding sustained this evening , pt. Lost his balance and hit it against a metal railing at home , dressing applied at triage .  Hypertensive at arrival .

## 2016-06-08 ENCOUNTER — Telehealth: Payer: Self-pay | Admitting: Internal Medicine

## 2016-06-08 ENCOUNTER — Other Ambulatory Visit: Payer: Self-pay | Admitting: *Deleted

## 2016-06-08 MED ORDER — HYDROCODONE-ACETAMINOPHEN 10-325 MG PO TABS: 1.0000 | ORAL_TABLET | Freq: Three times a day (TID) | ORAL | Status: DC | PRN

## 2016-06-08 NOTE — Telephone Encounter (Signed)
Okay for Vicodin No. 30

## 2016-06-08 NOTE — Discharge Instructions (Signed)

## 2016-06-08 NOTE — Telephone Encounter (Signed)
Pt would like dr k to return his call

## 2016-06-08 NOTE — Telephone Encounter (Signed)
Pharm is aware pt will have to pick up hydrocodone rx once rx is ready

## 2016-06-08 NOTE — Telephone Encounter (Signed)
Pt would like you to know he fell outside his home last night and ripped is arm  On the railing.  Pt states he got 100 stitches.  Pt will schedule with you to remove in 10 days. Pt would like you to send hydrocodone for pain.   [place]

## 2016-06-09 ENCOUNTER — Other Ambulatory Visit: Payer: Self-pay | Admitting: General Practice

## 2016-06-09 MED ORDER — LEVOTHYROXINE SODIUM 50 MCG PO TABS: ORAL_TABLET | ORAL | Status: DC

## 2016-06-13 ENCOUNTER — Telehealth: Payer: Self-pay | Admitting: Internal Medicine

## 2016-06-13 NOTE — Telephone Encounter (Signed)
Pts son would like to talk with you concerning the pt.

## 2016-06-16 NOTE — Telephone Encounter (Signed)
Left message for son to call us back.

## 2016-06-17 ENCOUNTER — Encounter: Payer: Self-pay | Admitting: Internal Medicine

## 2016-06-17 ENCOUNTER — Ambulatory Visit (INDEPENDENT_AMBULATORY_CARE_PROVIDER_SITE_OTHER): Payer: Medicare Other | Admitting: Internal Medicine

## 2016-06-17 VITALS — BP 140/80 | HR 83 | Temp 97.7°F | Ht 62.0 in | Wt 152.0 lb

## 2016-06-17 DIAGNOSIS — G450 Vertebro-basilar artery syndrome: Secondary | ICD-10-CM

## 2016-06-17 DIAGNOSIS — M069 Rheumatoid arthritis, unspecified: Secondary | ICD-10-CM

## 2016-06-17 NOTE — Patient Instructions (Addendum)

## 2016-06-17 NOTE — Progress Notes (Signed)
Subjective:    Patient ID: Robert Rowe, male    DOB: [birth year], 82 y.o.   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  He has seen neurology due to mild cognitive impairment. He has become more forgetful over the past few years.  He was seen today primarily for suture removal.  He was seen in the ED 10 days ago for initial treatment of a laceration involving his left arm.  He states that he had "100 sutures"placed at that time and the suturing took over one hour. He has not removed.  A pressure dressing since the sutures were placed.  There has been no pain  Past Medical History:  Diagnosis Date  . BENIGN PROSTATIC HYPERTROPHY 04/05/2007  . 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 CHEST DISCOMFORT 04/25/2009  . Chronic diastolic CHF (congestive heart failure) ([place])    a. Echo 12/16: mild LVH, EF 55-60%, no RWMA, Gr 1 DD, mild AI, MAC, mild LAE, normal RVF, PASP 37 mmHg  . Chronic kidney disease    chronic   . DJD (degenerative joint disease)   . Dysrhythmia    pvc's   . GERD 12/03/2007  . Hiatal hernia   . History of nuclear stress test    Myoview 1/17: EF 53%, normal perfusion; Low Risk  . HYPERLIPIDEMIA 04/05/2007  . Hypothyroidism   . Osteoarth NOS-Unspec 04/05/2007  . Palpitations 02/12/2010  . PEDAL EDEMA 05/01/2008  . Rheumatoid arthritis(714.0) 04/05/2007  . TRANSIENT ISCHEMIC ATTACK, HX OF 04/05/2007   ?      Social History   Social History  . Marital status: Married    Spouse name: N/A  . Number of children: N/A  . Years of education: N/A   Occupational History  . Not on file.   Social History Main Topics  . Smoking status: Former Smoker    Quit date: 08/13/1953  . Smokeless tobacco: Never Used  . Alcohol use Yes  . Drug use: No  . Sexual activity: Not on file   Other Topics Concern  . Not on file   Social History Narrative  . No narrative on file     Past Surgical History:  Procedure Laterality Date  . BALLOON DILATION N/A 03/12/2013   Procedure: BALLOON DILATION;  Surgeon: Inda Castle, MD;  Location: [facility];  Service: Endoscopy;  Laterality: N/A;  . CATARACT EXTRACTION  2001   bilateral  . CYSTOSCOPY WITH URETHRAL DILATATION N/A 09/24/2015   Procedure: CYSTOSCOPY WITH COOK BALLOON DILATATION OF URETHRAL STRICTURE ;  Surgeon: Carolan Clines, MD;  Location: [facility];  Service: Urology;  Laterality: N/A;  . ESOPHAGOGASTRODUODENOSCOPY N/A 03/12/2013   Procedure: ESOPHAGOGASTRODUODENOSCOPY (EGD);  Surgeon: Inda Castle, MD;  Location: [facility];  Service: Endoscopy;  Laterality: N/A;  . HEMORRHOID SURGERY    . HERNIA REPAIR     inguinal x5  . KNEE SURGERY  arthroscopic   left  . PROSTATE CRYOABLATION     2000  . SHOULDER SURGERY  few years ago   left  . TRANSURETHRAL RESECTION OF PROSTATE N/A 03/19/2015   Procedure: TRANSURETHRAL RESECTION OF THE PROSTATE (TURP);  Surgeon: Carolan Clines, MD;  Location: [facility];  Service: Urology;  Laterality: N/A;    Family History  Problem Relation Age of Onset  . Breast cancer Mother   . Lymphoma Brother   . Heart disease Father   . Heart  attack Father   . Stroke Neg Hx   . Hypertension Neg Hx     Allergies  Allergen Reactions  . Sulfonamide Derivatives     Patient does not remember allergy symptoms    Current Outpatient Prescriptions on File Prior to Visit  Medication Sig Dispense Refill  . clopidogrel (PLAVIX) 75 MG tablet Take 1 tablet (75 mg total) by mouth daily. 90 tablet 1  . furosemide (LASIX) 40 MG tablet Take 20 mg by mouth daily.   11  . HYDROcodone-acetaminophen (NORCO) 10-325 MG tablet Take 1 tablet by mouth every 8 (eight) hours as needed. 30 tablet 0  . levothyroxine (SYNTHROID, LEVOTHROID) 50 MCG tablet TAKE 1 TABLET (50 MCG TOTAL) BY MOUTH DAILY. 90 tablet 0  . metoprolol succinate (TOPROL-XL) 25 MG 24 hr tablet Take 1 tablet (25 mg total) by  mouth at bedtime. 90 tablet 3  . neomycin-polymyxin-dexamethasone (MAXITROL) 0.1 % ophthalmic suspension Place 2 drops into both eyes 4 (four) times daily. 5 mL 3  . nitroGLYCERIN (NITROSTAT) 0.4 MG SL tablet Place 0.4 mg under the tongue every 5 (five) minutes as needed (ESOPHEAGEAL SPASPMS). Reported on 03/01/2016    . pantoprazole (PROTONIX) 40 MG tablet Take 1 tablet (40 mg total) by mouth daily. 90 tablet 3   No current facility-administered medications on file prior to visit.     BP 140/80 (BP Location: Right Arm, Patient Position: Sitting, Cuff Size: Normal)   Pulse 83   Temp 97.7 F (36.5 C) (Oral)   Ht 5\' 2"  (1.575 m)   Wt 152 lb (68.9 kg)   SpO2 98%   BMI 27.80 kg/m     Review of Systems  Constitutional: Negative for appetite change, chills, fatigue and fever.  HENT: Negative for congestion, dental problem, ear pain, hearing loss, sore throat, tinnitus, trouble swallowing and voice change.   Eyes: Negative for pain, discharge and visual disturbance.  Respiratory: Negative for cough, chest tightness, wheezing and stridor.   Cardiovascular: Negative for chest pain, palpitations and leg swelling.  Gastrointestinal: Negative for abdominal distention, abdominal pain, blood in stool, constipation, diarrhea, nausea and vomiting.  Genitourinary: Negative for difficulty urinating, discharge, flank pain, genital sores, hematuria and urgency.  Musculoskeletal: Positive for arthralgias and gait problem. Negative for back pain, joint swelling, myalgias and neck stiffness.  Skin: Positive for wound. Negative for rash.  Neurological: Negative for dizziness, syncope, speech difficulty, weakness, numbness and headaches.  Hematological: Negative for adenopathy. Does not bruise/bleed easily.  Psychiatric/Behavioral: Positive for confusion. Negative for behavioral problems and dysphoric mood. The patient is not nervous/anxious.        Objective:   Physical Exam  Constitutional: He appears  well-developed and well-nourished. No distress.  Skin:  Curvilinear laceration of 10 cm involving his left arm          Assessment & Plan:   Suture removal.  Patient had a 10 cm curvilinear laceration involving his left arm. Approximately 12-15 sutures were removed. The wound was clean , although scattered areas with some incomplete closure  Some dry skin was gently debrided with mild scrubbing with sterile gauze  After sutures removed, Steri-Strips placed.  Wound covered with non-stick Telfa and dressed with mild compression dressing  Wound care discussed.  He would like to return in one week for follow-up.  We'll reassess at that time  Nyoka Cowden, MD

## 2016-06-17 NOTE — Progress Notes (Signed)
Pre visit review using our clinic review tool, if applicable. No additional management support is needed unless otherwise documented below in the visit note. 

## 2016-06-17 NOTE — Telephone Encounter (Signed)
Called son back and he said he had already talked to Dr. Raliegh Ip and every thing is fine.

## 2016-06-26 DIAGNOSIS — R4781 Slurred speech: Secondary | ICD-10-CM | POA: Diagnosis not present

## 2016-06-27 ENCOUNTER — Encounter: Payer: Self-pay | Admitting: Internal Medicine

## 2016-06-27 ENCOUNTER — Ambulatory Visit (INDEPENDENT_AMBULATORY_CARE_PROVIDER_SITE_OTHER): Payer: Medicare Other | Admitting: Internal Medicine

## 2016-06-27 VITALS — BP 120/70 | HR 101 | Temp 97.5°F | Ht 62.0 in | Wt 150.0 lb

## 2016-06-27 DIAGNOSIS — E785 Hyperlipidemia, unspecified: Secondary | ICD-10-CM

## 2016-06-27 DIAGNOSIS — I679 Cerebrovascular disease, unspecified: Secondary | ICD-10-CM | POA: Diagnosis not present

## 2016-06-27 DIAGNOSIS — G450 Vertebro-basilar artery syndrome: Secondary | ICD-10-CM | POA: Diagnosis not present

## 2016-06-27 MED ORDER — ATORVASTATIN CALCIUM 10 MG PO TABS: 10.0000 mg | ORAL_TABLET | Freq: Every day | ORAL | 3 refills | Status: DC

## 2016-06-27 NOTE — Patient Instructions (Addendum)
  Clean wound left arm daily and report any increasing pain, drainage or redness  Atorvastatin 10 mg daily  Neurology follow-up as scheduled  Report any new or worsening neurological symptoms

## 2016-06-27 NOTE — Progress Notes (Signed)
Subjective:    Patient ID: Robert Rowe, male    DOB: 10-18-1918, 82 y.o.   MRN: [id number]  HPI 82 year old patient who suffered an avulsion laceration to the left lateral lower arm on July 18.  Multiple sutures were placed and removed on July 28.  He returns today for follow-up of his wound.  He has a history of vertebrobasilar artery syndrome and significant intracranial artery stenosis. Yesterday he suffered another apparent TIA with right facial weakness and loss of consciousness lasting 2-5 minutes.  EMS was called.  Cardiac tracing unremarkable.  Patient had full neurological evaluation in November. Remains on Plavix but has declined resuming statin therapy.  He states that he has not tolerated this medication in the past but does not recall specifically what statin.  He was treated with.  Past Medical History:  Diagnosis Date  . BENIGN PROSTATIC HYPERTROPHY 04/05/2007  . 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 CHEST DISCOMFORT 04/25/2009  . Chronic diastolic CHF (congestive heart failure) ([place])    a. Echo 12/16: mild LVH, EF 55-60%, no RWMA, Gr 1 DD, mild AI, MAC, mild LAE, normal RVF, PASP 37 mmHg  . Chronic kidney disease    chronic   . DJD (degenerative joint disease)   . Dysrhythmia    pvc's   . GERD 12/03/2007  . Hiatal hernia   . History of nuclear stress test    Myoview 1/17: EF 53%, normal perfusion; Low Risk  . HYPERLIPIDEMIA 04/05/2007  . Hypothyroidism   . Osteoarth NOS-Unspec 04/05/2007  . Palpitations 02/12/2010  . PEDAL EDEMA 05/01/2008  . Rheumatoid arthritis(714.0) 04/05/2007  . TRANSIENT ISCHEMIC ATTACK, HX OF 04/05/2007   ?      Social History   Social History  . Marital status: Married    Spouse name: N/A  . Number of children: N/A  . Years of education: N/A   Occupational History  . Not on file.   Social History Main Topics  . Smoking status: Former Smoker    Quit date: 08/13/1953  . Smokeless tobacco: Never Used  . Alcohol  use Yes  . Drug use: No  . Sexual activity: Not on file   Other Topics Concern  . Not on file   Social History Narrative  . No narrative on file    Past Surgical History:  Procedure Laterality Date  . BALLOON DILATION N/A 03/12/2013   Procedure: BALLOON DILATION;  Surgeon: Inda Castle, MD;  Location: [facility];  Service: Endoscopy;  Laterality: N/A;  . CATARACT EXTRACTION  2001   bilateral  . CYSTOSCOPY WITH URETHRAL DILATATION N/A 09/24/2015   Procedure: CYSTOSCOPY WITH COOK BALLOON DILATATION OF URETHRAL STRICTURE ;  Surgeon: Carolan Clines, MD;  Location: [facility];  Service: Urology;  Laterality: N/A;  . ESOPHAGOGASTRODUODENOSCOPY N/A 03/12/2013   Procedure: ESOPHAGOGASTRODUODENOSCOPY (EGD);  Surgeon: Inda Castle, MD;  Location: [facility];  Service: Endoscopy;  Laterality: N/A;  . HEMORRHOID SURGERY    . HERNIA REPAIR     inguinal x5  . KNEE SURGERY  arthroscopic   left  . PROSTATE CRYOABLATION     2000  . SHOULDER SURGERY  few years ago   left  . TRANSURETHRAL RESECTION OF PROSTATE N/A 03/19/2015   Procedure: TRANSURETHRAL RESECTION OF THE PROSTATE (TURP);  Surgeon: Carolan Clines, MD;  Location: [facility];  Service: Urology;  Laterality: N/A;    Family History  Problem Relation Age of Onset  . Breast cancer Mother   .  Lymphoma Brother   . Heart disease Father   . Heart attack Father   . Stroke Neg Hx   . Hypertension Neg Hx     Allergies  Allergen Reactions  . Sulfonamide Derivatives     Patient does not remember allergy symptoms    Current Outpatient Prescriptions on File Prior to Visit  Medication Sig Dispense Refill  . clopidogrel (PLAVIX) 75 MG tablet Take 1 tablet (75 mg total) by mouth daily. 90 tablet 1  . furosemide (LASIX) 40 MG tablet Take 20 mg by mouth daily.   11  . HYDROcodone-acetaminophen (NORCO) 10-325 MG tablet Take 1 tablet by mouth every 8 (eight) hours as needed. 30 tablet 0  . hydrocortisone 2.5 % cream APPLY TO AFFECTED  AREA TWICE DAILY  0  . levothyroxine (SYNTHROID, LEVOTHROID) 50 MCG tablet TAKE 1 TABLET (50 MCG TOTAL) BY MOUTH DAILY. 90 tablet 0  . metoprolol succinate (TOPROL-XL) 25 MG 24 hr tablet Take 1 tablet (25 mg total) by mouth at bedtime. 90 tablet 3  . neomycin-polymyxin-dexamethasone (MAXITROL) 0.1 % ophthalmic suspension Place 2 drops into both eyes 4 (four) times daily. 5 mL 3  . nitroGLYCERIN (NITROSTAT) 0.4 MG SL tablet Place 0.4 mg under the tongue every 5 (five) minutes as needed (ESOPHEAGEAL SPASPMS). Reported on 03/01/2016    . pantoprazole (PROTONIX) 40 MG tablet Take 1 tablet (40 mg total) by mouth daily. 90 tablet 3   No current facility-administered medications on file prior to visit.     BP 120/70 (BP Location: Right Arm, Patient Position: Sitting, Cuff Size: Normal)   Pulse (!) 101   Temp 97.5 F (36.4 C) (Oral)   Ht 5\' 2"  (1.575 m)   Wt 150 lb (68 kg)   SpO2 95%   BMI 27.44 kg/m   \   Review of Systems  Constitutional: Negative.   Skin: Positive for wound.  Neurological: Positive for syncope and facial asymmetry.  Psychiatric/Behavioral: Positive for decreased concentration.       Objective:   Physical Exam  Constitutional: He appears well-developed and well-nourished. No distress.  Skin:  Largely healed laceration involving the left lower lateral arm 3.  Small open areas , not completely healed.  No erythema or drainage          Assessment & Plan:   Wound left arm.  Healing nicely.  The patient was asked to wash daily and to try to keep open to air as much as possible.  Patient/son somewhat concerned about recurrent trauma. We'll notify the office if any increasing pain, redness or drainage  Vertebrobasilar insufficiency.  Aggressive risk factor modification discussed.  Patient agreeable to low intensity statin therapy.  Will start atorvastatin 10.  Follow-up neurology next month as scheduled  Nyoka Cowden, MD

## 2016-06-28 DIAGNOSIS — N35010 Post-traumatic urethral stricture, male, meatal: Secondary | ICD-10-CM | POA: Diagnosis not present

## 2016-06-28 DIAGNOSIS — N401 Enlarged prostate with lower urinary tract symptoms: Secondary | ICD-10-CM | POA: Diagnosis not present

## 2016-07-12 DIAGNOSIS — M0609 Rheumatoid arthritis without rheumatoid factor, multiple sites: Secondary | ICD-10-CM | POA: Diagnosis not present

## 2016-07-12 DIAGNOSIS — N390 Urinary tract infection, site not specified: Secondary | ICD-10-CM | POA: Diagnosis not present

## 2016-07-12 DIAGNOSIS — M150 Primary generalized (osteo)arthritis: Secondary | ICD-10-CM | POA: Diagnosis not present

## 2016-07-12 DIAGNOSIS — N19 Unspecified kidney failure: Secondary | ICD-10-CM | POA: Diagnosis not present

## 2016-07-13 ENCOUNTER — Other Ambulatory Visit: Payer: Self-pay | Admitting: Urology

## 2016-07-15 ENCOUNTER — Encounter (HOSPITAL_COMMUNITY): Payer: Self-pay

## 2016-07-15 ENCOUNTER — Encounter: Payer: Self-pay | Admitting: Hematology

## 2016-07-15 ENCOUNTER — Telehealth: Payer: Self-pay | Admitting: Internal Medicine

## 2016-07-15 NOTE — Progress Notes (Signed)
LOV with ekg dr [name] 6/17, stress 1/17, eccho 12/16, chest 12/16  epic

## 2016-07-15 NOTE — Telephone Encounter (Signed)
The patient wants Butch Penny to call him

## 2016-07-15 NOTE — Telephone Encounter (Signed)
Spoke to pt and wife, had questions about his upcoming surgery with Urology not sure what his appointments are for Monday at 9:00 AM  and when his surgery is. Asked pt if he called the office? He said yes. Told pt and wife that I see in his chart Pre admission testing on Monday and surgery is Wednesday. Told them to try and contact Urology office if further clarification. Pt verbalized understanding.

## 2016-07-15 NOTE — Patient Instructions (Addendum)
ADNAN GECK  07/15/2016   Your procedure is scheduled on: 07/20/16  Report to [facility]  Entrance take [place]  elevators to 3rd floor to  [place] at 1115 AM  Call this number if you have problems the morning of surgery [phone]   Remember: ONLY 1 PERSON MAY GO WITH YOU TO SHORT STAY TO GET  READY MORNING OF YOUR SURGERY.  Do not eat food AFTER MIDNIGHT.  May Have Clear Liquids until  0715 AM---THEN NOTHING BY MOUTH      Take these medicines the morning of surgery with A SIP OF WATER:.Levothyroxine, Pantoprazole May take NITROGLYCERIN, Norco if needed                             You may not have any metal on your body including hair pins and              piercings  Do not wear jewelry, make-up, lotions, powders or perfumes, deodorant             Do not wear nail polish.  Do not shave  48 hours prior to surgery.              Men may shave face and neck.   Do not bring valuables to the hospital. [place].  Contacts, dentures or bridgework may not be worn into surgery.  Leave suitcase in the car. After surgery it may be brought to your room.     Patients discharged the day of surgery will not be allowed to drive home.  Name and phone number of your driver:wife Myna Hidalgo  Special Instructions: N/A              Please read over the following fact sheets you were given: _____________________________________________________________________    CLEAR LIQUID DIET   Foods Allowed                                                                     Foods Excluded  Coffee and tea, regular and decaf                             liquids that you cannot  Plain Jell-O in any flavor                                             see through such as: Fruit ices (not with fruit pulp)                                     milk, soups, orange juice  Iced Popsicles  All solid  food Carbonated beverages, regular and diet                                    Cranberry, grape and apple juices Sports drinks like Gatorade Lightly seasoned clear broth or consume(fat free) Sugar, honey syrup  Sample Menu Breakfast                                Lunch                                     Supper Cranberry juice                    Beef broth                            Chicken broth Jell-O                                     Grape juice                           Apple juice Coffee or tea                        Jell-O                                      Popsicle                                                Coffee or tea                        Coffee or tea  _____________________________________________________________________  [facility] - Preparing for Surgery Before surgery, you can play an important role.  Because skin is not sterile, your skin needs to be as free of germs as possible.  You can reduce the number of germs on your skin by washing with CHG (chlorahexidine gluconate) soap before surgery.  CHG is an antiseptic cleaner which kills germs and bonds with the skin to continue killing germs even after washing. Please DO NOT use if you have an allergy to CHG or antibacterial soaps.  If your skin becomes reddened/irritated stop using the CHG and inform your nurse when you arrive at Short Stay. Do not shave (including legs and underarms) for at least 48 hours prior to the first CHG shower.  You may shave your face/neck. Please follow these instructions carefully:  1.  Shower with CHG Soap the night before surgery and the  morning of Surgery.  2.  If you choose to wash your hair, wash your hair first as usual with your  normal  shampoo.  3.  After you shampoo, rinse your hair and body thoroughly to remove the  shampoo.  4.  Use CHG as you would any other liquid soap.  You can apply chg directly  to the skin and wash                       Gently  with a scrungie or clean washcloth.  5.  Apply the CHG Soap to your body ONLY FROM THE NECK DOWN.   Do not use on face/ open                           Wound or open sores. Avoid contact with eyes, ears mouth and genitals (private parts).                       Wash face,  Genitals (private parts) with your normal soap.             6.  Wash thoroughly, paying special attention to the area where your surgery  will be performed.  7.  Thoroughly rinse your body with warm water from the neck down.  8.  DO NOT shower/wash with your normal soap after using and rinsing off  the CHG Soap.                9.  Pat yourself dry with a clean towel.            10.  Wear clean pajamas.            11.  Place clean sheets on your bed the night of your first shower and do not  sleep with pets. Day of Surgery : Do not apply any lotions/deodorants the morning of surgery.  Please wear clean clothes to the hospital/surgery center.  FAILURE TO FOLLOW THESE INSTRUCTIONS MAY RESULT IN THE CANCELLATION OF YOUR SURGERY PATIENT SIGNATURE_________________________________  NURSE SIGNATURE__________________________________  ________________________________________________________________________

## 2016-07-18 ENCOUNTER — Encounter (HOSPITAL_COMMUNITY)
Admission: RE | Admit: 2016-07-18 | Discharge: 2016-07-18 | Disposition: A | Discharge status: Home / Self Care | Payer: Medicare Other | Source: Ambulatory Visit | Attending: Urology | Admitting: Urology

## 2016-07-18 ENCOUNTER — Encounter (HOSPITAL_COMMUNITY): Payer: Self-pay

## 2016-07-18 DIAGNOSIS — Z79899 Other long term (current) drug therapy: Secondary | ICD-10-CM | POA: Diagnosis not present

## 2016-07-18 DIAGNOSIS — M069 Rheumatoid arthritis, unspecified: Secondary | ICD-10-CM | POA: Diagnosis not present

## 2016-07-18 DIAGNOSIS — K219 Gastro-esophageal reflux disease without esophagitis: Secondary | ICD-10-CM | POA: Diagnosis not present

## 2016-07-18 DIAGNOSIS — N400 Enlarged prostate without lower urinary tract symptoms: Secondary | ICD-10-CM | POA: Diagnosis not present

## 2016-07-18 DIAGNOSIS — E039 Hypothyroidism, unspecified: Secondary | ICD-10-CM | POA: Diagnosis not present

## 2016-07-18 DIAGNOSIS — Z8673 Personal history of transient ischemic attack (TIA), and cerebral infarction without residual deficits: Secondary | ICD-10-CM | POA: Diagnosis not present

## 2016-07-18 DIAGNOSIS — Z9079 Acquired absence of other genital organ(s): Secondary | ICD-10-CM | POA: Diagnosis not present

## 2016-07-18 DIAGNOSIS — E785 Hyperlipidemia, unspecified: Secondary | ICD-10-CM | POA: Diagnosis not present

## 2016-07-18 DIAGNOSIS — I5032 Chronic diastolic (congestive) heart failure: Secondary | ICD-10-CM | POA: Diagnosis not present

## 2016-07-18 DIAGNOSIS — Z87891 Personal history of nicotine dependence: Secondary | ICD-10-CM | POA: Diagnosis not present

## 2016-07-18 DIAGNOSIS — N189 Chronic kidney disease, unspecified: Secondary | ICD-10-CM | POA: Diagnosis not present

## 2016-07-18 DIAGNOSIS — I739 Peripheral vascular disease, unspecified: Secondary | ICD-10-CM | POA: Diagnosis not present

## 2016-07-18 DIAGNOSIS — Z7902 Long term (current) use of antithrombotics/antiplatelets: Secondary | ICD-10-CM | POA: Diagnosis not present

## 2016-07-18 DIAGNOSIS — N359 Urethral stricture, unspecified: Secondary | ICD-10-CM | POA: Diagnosis not present

## 2016-07-18 HISTORY — DX: Reserved for inherently not codable concepts without codable children: IMO0001

## 2016-07-18 LAB — CBC
HCT: 44.2 % (ref 39.0–52.0)
Hemoglobin: 13.8 g/dL (ref 13.0–17.0)
MCH: 29.9 pg (ref 26.0–34.0)
MCHC: 31.2 g/dL (ref 30.0–36.0)
MCV: 95.9 fL (ref 78.0–100.0)
Platelets: 601 10*3/uL — ABNORMAL HIGH (ref 150–400)
RBC: 4.61 MIL/uL (ref 4.22–5.81)
RDW: 13.3 % (ref 11.5–15.5)
WBC: 13.1 10*3/uL — ABNORMAL HIGH (ref 4.0–10.5)

## 2016-07-18 LAB — BASIC METABOLIC PANEL
Anion gap: 8 (ref 5–15)
BUN: 16 mg/dL (ref 6–20)
CO2: 27 mmol/L (ref 22–32)
Calcium: 9.3 mg/dL (ref 8.9–10.3)
Chloride: 101 mmol/L (ref 101–111)
Creatinine, Ser: 1.67 mg/dL — ABNORMAL HIGH (ref 0.61–1.24)
GFR calc Af Amer: 38 mL/min — ABNORMAL LOW (ref >60)
GFR calc non Af Amer: 33 mL/min — ABNORMAL LOW (ref >60)
Glucose, Bld: 227 mg/dL — ABNORMAL HIGH (ref 65–99)
Potassium: 4.9 mmol/L (ref 3.5–5.1)
Sodium: 136 mmol/L (ref 135–145)

## 2016-07-18 NOTE — Progress Notes (Signed)
Faxed bmp, cbc to dr Tresa Moore via epic

## 2016-07-20 ENCOUNTER — Ambulatory Visit (HOSPITAL_COMMUNITY)
Admission: RE | Admit: 2016-07-20 | Discharge: 2016-07-20 | Disposition: A | Discharge status: Home / Self Care | Payer: Medicare Other | Source: Ambulatory Visit | Attending: Urology | Admitting: Urology

## 2016-07-20 ENCOUNTER — Encounter (HOSPITAL_COMMUNITY)
Admission: RE | Disposition: A | Discharge status: Home / Self Care | Payer: Self-pay | Source: Ambulatory Visit | Attending: Urology

## 2016-07-20 ENCOUNTER — Encounter (HOSPITAL_COMMUNITY): Payer: Self-pay | Admitting: *Deleted

## 2016-07-20 ENCOUNTER — Ambulatory Visit (HOSPITAL_COMMUNITY): Payer: Medicare Other | Admitting: Certified Registered Nurse Anesthetist

## 2016-07-20 DIAGNOSIS — E039 Hypothyroidism, unspecified: Secondary | ICD-10-CM | POA: Insufficient documentation

## 2016-07-20 DIAGNOSIS — I5032 Chronic diastolic (congestive) heart failure: Secondary | ICD-10-CM | POA: Diagnosis not present

## 2016-07-20 DIAGNOSIS — Z8673 Personal history of transient ischemic attack (TIA), and cerebral infarction without residual deficits: Secondary | ICD-10-CM | POA: Diagnosis not present

## 2016-07-20 DIAGNOSIS — N400 Enlarged prostate without lower urinary tract symptoms: Secondary | ICD-10-CM | POA: Insufficient documentation

## 2016-07-20 DIAGNOSIS — I739 Peripheral vascular disease, unspecified: Secondary | ICD-10-CM | POA: Insufficient documentation

## 2016-07-20 DIAGNOSIS — N189 Chronic kidney disease, unspecified: Secondary | ICD-10-CM | POA: Diagnosis not present

## 2016-07-20 DIAGNOSIS — N359 Urethral stricture, unspecified: Secondary | ICD-10-CM | POA: Insufficient documentation

## 2016-07-20 DIAGNOSIS — M069 Rheumatoid arthritis, unspecified: Secondary | ICD-10-CM | POA: Insufficient documentation

## 2016-07-20 DIAGNOSIS — Z7902 Long term (current) use of antithrombotics/antiplatelets: Secondary | ICD-10-CM | POA: Insufficient documentation

## 2016-07-20 DIAGNOSIS — E785 Hyperlipidemia, unspecified: Secondary | ICD-10-CM | POA: Diagnosis not present

## 2016-07-20 DIAGNOSIS — K219 Gastro-esophageal reflux disease without esophagitis: Secondary | ICD-10-CM | POA: Insufficient documentation

## 2016-07-20 DIAGNOSIS — Z79899 Other long term (current) drug therapy: Secondary | ICD-10-CM | POA: Insufficient documentation

## 2016-07-20 DIAGNOSIS — Z9079 Acquired absence of other genital organ(s): Secondary | ICD-10-CM | POA: Insufficient documentation

## 2016-07-20 DIAGNOSIS — Z87891 Personal history of nicotine dependence: Secondary | ICD-10-CM | POA: Diagnosis not present

## 2016-07-20 DIAGNOSIS — N35010 Post-traumatic urethral stricture, male, meatal: Secondary | ICD-10-CM | POA: Diagnosis not present

## 2016-07-20 HISTORY — PX: CYSTOSCOPY WITH URETHRAL DILATATION: SHX5125

## 2016-07-20 LAB — GLUCOSE, CAPILLARY: Glucose-Capillary: 139 mg/dL — ABNORMAL HIGH (ref 65–99)

## 2016-07-20 SURGERY — CYSTOSCOPY, WITH URETHRAL DILATION
Anesthesia: General

## 2016-07-20 MED ORDER — TRAMADOL HCL 50 MG PO TABS: 50.0000 mg | ORAL_TABLET | Freq: Four times a day (QID) | ORAL | 0 refills | Status: DC | PRN

## 2016-07-20 MED ORDER — PROPOFOL 10 MG/ML IV BOLUS
INTRAVENOUS | Status: AC
  Filled 2016-07-20: qty 40

## 2016-07-20 MED ORDER — SODIUM CHLORIDE 0.9 % IR SOLN
Status: DC | PRN
  Administered 2016-07-20: 3000 mL

## 2016-07-20 MED ORDER — LIDOCAINE HCL (CARDIAC) 20 MG/ML IV SOLN
INTRAVENOUS | Status: DC | PRN
  Administered 2016-07-20: 50 mg via INTRAVENOUS

## 2016-07-20 MED ORDER — LACTATED RINGERS IV SOLN
INTRAVENOUS | Status: DC | PRN
  Administered 2016-07-20: 13:00:00 via INTRAVENOUS

## 2016-07-20 MED ORDER — LABETALOL HCL 5 MG/ML IV SOLN
5.0000 mg | Freq: Once | INTRAVENOUS | Status: AC
  Administered 2016-07-20: 5 mg via INTRAVENOUS

## 2016-07-20 MED ORDER — PROMETHAZINE HCL 25 MG/ML IJ SOLN: 6.2500 mg | INTRAMUSCULAR | Status: DC | PRN

## 2016-07-20 MED ORDER — CEFAZOLIN SODIUM-DEXTROSE 2-4 GM/100ML-% IV SOLN
INTRAVENOUS | Status: AC
  Filled 2016-07-20: qty 100

## 2016-07-20 MED ORDER — IOHEXOL 300 MG/ML  SOLN
INTRAMUSCULAR | Status: DC | PRN
  Administered 2016-07-20: 50 mL

## 2016-07-20 MED ORDER — PROPOFOL 10 MG/ML IV BOLUS
INTRAVENOUS | Status: DC | PRN
Start: 2016-07-20 — End: 2016-07-20
  Administered 2016-07-20: 30 mg via INTRAVENOUS

## 2016-07-20 MED ORDER — PROPOFOL 500 MG/50ML IV EMUL
INTRAVENOUS | Status: DC | PRN
  Administered 2016-07-20: 75 ug/kg/min via INTRAVENOUS

## 2016-07-20 MED ORDER — PHENYLEPHRINE HCL 10 MG/ML IJ SOLN
INTRAMUSCULAR | Status: DC | PRN
  Administered 2016-07-20: 40 ug via INTRAVENOUS

## 2016-07-20 MED ORDER — FENTANYL CITRATE (PF) 100 MCG/2ML IJ SOLN
INTRAMUSCULAR | Status: DC | PRN
  Administered 2016-07-20 (×2): 50 ug via INTRAVENOUS

## 2016-07-20 MED ORDER — FENTANYL CITRATE (PF) 100 MCG/2ML IJ SOLN: 25.0000 ug | INTRAMUSCULAR | Status: DC | PRN

## 2016-07-20 MED ORDER — FENTANYL CITRATE (PF) 100 MCG/2ML IJ SOLN
INTRAMUSCULAR | Status: AC
  Filled 2016-07-20: qty 2

## 2016-07-20 MED ORDER — CEFAZOLIN SODIUM-DEXTROSE 2-4 GM/100ML-% IV SOLN
2.0000 g | INTRAVENOUS | Status: AC
  Administered 2016-07-20: 2 g via INTRAVENOUS
  Filled 2016-07-20: qty 100

## 2016-07-20 MED ORDER — LABETALOL HCL 5 MG/ML IV SOLN: 10.0000 mg | INTRAVENOUS | Status: DC | PRN

## 2016-07-20 MED ORDER — LABETALOL HCL 5 MG/ML IV SOLN
INTRAVENOUS | Status: AC
  Filled 2016-07-20: qty 4

## 2016-07-20 MED ORDER — CEPHALEXIN 500 MG PO CAPS: 500.0000 mg | ORAL_CAPSULE | Freq: Two times a day (BID) | ORAL | 0 refills | Status: DC

## 2016-07-20 SURGICAL SUPPLY — 21 items
BAG URINE DRAINAGE (UROLOGICAL SUPPLIES) ×4 IMPLANT
BALLN NEPHROSTOMY (BALLOONS) ×2
BALLOON NEPHROSTOMY (BALLOONS) ×1 IMPLANT
CATH FOLEY 2W COUNCIL 20FR 5CC (CATHETERS) IMPLANT
CATH FOLEY 2WAY SLVR  5CC 24FR (CATHETERS) ×1
CATH FOLEY 2WAY SLVR 5CC 24FR (CATHETERS) ×1 IMPLANT
CATH INTERMIT  6FR 70CM (CATHETERS) IMPLANT
CATH ROBINSON RED A/P 14FR (CATHETERS) IMPLANT
CATH URET 5FR 28IN CONE TIP (BALLOONS)
CATH URET 5FR 70CM CONE TIP (BALLOONS) IMPLANT
CLOTH BEACON ORANGE TIMEOUT ST (SAFETY) ×2 IMPLANT
GLOVE BIO SURGEON STRL SZ7.5 (GLOVE) ×2 IMPLANT
GOWN STRL REUS W/TWL XL LVL3 (GOWN DISPOSABLE) ×2 IMPLANT
GUIDEWIRE .038 (WIRE) IMPLANT
GUIDEWIRE ANG ZIPWIRE 038X150 (WIRE) ×2 IMPLANT
GUIDEWIRE STR DUAL SENSOR (WIRE) IMPLANT
MANIFOLD NEPTUNE II (INSTRUMENTS) IMPLANT
NS IRRIG 1000ML POUR BTL (IV SOLUTION) IMPLANT
PACK CYSTO (CUSTOM PROCEDURE TRAY) ×2 IMPLANT
TUBING INSUFFLATION 10FT LAP (TUBING) IMPLANT
WATER STERILE IRR 3000ML UROMA (IV SOLUTION) IMPLANT

## 2016-07-20 NOTE — Anesthesia Postprocedure Evaluation (Signed)
Anesthesia Post Note  Patient: Robert Rowe  Procedure(s) Performed: Procedure(s) (LRB): CYSTOSCOPY WITH URETHRAL DILATATION (N/A)  Patient location during evaluation: PACU Anesthesia Type: MAC Level of consciousness: awake and alert Pain management: pain level controlled Vital Signs Assessment: post-procedure vital signs reviewed and stable Respiratory status: spontaneous breathing, nonlabored ventilation and respiratory function stable Cardiovascular status: stable and blood pressure returned to baseline Anesthetic complications: no    Last Vitals:  Vitals:   07/20/16 1415 07/20/16 1505  BP: (!) 146/72 136/69  Pulse:  100  Resp: 16 18  Temp: 36.4 C     Last Pain:  Vitals:   07/20/16 1345  TempSrc:   PainSc: 2                  Nilda Simmer

## 2016-07-20 NOTE — Brief Op Note (Signed)
07/20/2016  12:55 PM  PATIENT:  [name]  82 y.o. male  PRE-OPERATIVE DIAGNOSIS:  URETHRAL STRICTURE  POST-OPERATIVE DIAGNOSIS:  URETHRAL STRICTURE  PROCEDURE:  Procedure(s) with comments: CYSTOSCOPY WITH URETHRAL DILATATION (N/A) - 1 HOUR  SURGEON:  Surgeon(s) and Role:    * Alexis Frock, MD - Primary  PHYSICIAN ASSISTANT:   ASSISTANTS: none   ANESTHESIA:   MAC  EBL:  Total I/O In: 500 [I.V.:500] Out: -   BLOOD ADMINISTERED:none  DRAINS: 34F foley to gravity   LOCAL MEDICATIONS USED:  NONE  SPECIMEN:  No Specimen  DISPOSITION OF SPECIMEN:  N/A  COUNTS:  YES  TOURNIQUET:  * No tourniquets in log *  DICTATION: .Other Dictation: Dictation Number [id number]  PLAN OF CARE: Discharge to home after PACU  PATIENT DISPOSITION:  PACU - hemodynamically stable.   Delay start of Pharmacological VTE agent (>24hrs) due to surgical blood loss or risk of bleeding: yes

## 2016-07-20 NOTE — Anesthesia Preprocedure Evaluation (Addendum)
Anesthesia Evaluation  Patient identified by MRN, date of birth, ID band Patient awake    Reviewed: Allergy & Precautions, NPO status , Patient's Chart, lab work & pertinent test results  History of Anesthesia Complications Negative for: history of anesthetic complications  Airway Mallampati: II  TM Distance: >3 FB Neck ROM: Full    Dental no notable dental hx. (+) Dental Advisory Given   Pulmonary shortness of breath, former smoker,    Pulmonary exam normal breath sounds clear to auscultation       Cardiovascular (-) angina+ Peripheral Vascular Disease and +CHF  (-) Past MI and (-) Cardiac Stents Normal cardiovascular exam+ dysrhythmias  Rhythm:Regular Rate:Normal  ECHO 11-18-15:  Study Conclusions  - Left ventricle: The cavity size was normal. There was mild   concentric hypertrophy. Systolic function was normal. The   estimated ejection fraction was in the range of 55% to 60%. Wall   motion was normal; there were no regional wall motion   abnormalities. Doppler parameters are consistent with abnormal   left ventricular relaxation (grade 1 diastolic dysfunction).   Doppler parameters are consistent with high ventricular filling   pressure. - Aortic valve: Normal thickness, mildly calcified leaflets. There   was mild regurgitation. - Mitral valve: Calcified annulus. - Left atrium: The atrium was mildly dilated. - Right ventricle: The cavity size was normal. Wall thickness was   normal. Systolic function was normal. - Pulmonary arteries: Systolic pressure was mildly increased. PA   peak pressure: 37 mm Hg (S). - Inferior vena cava: The vessel was normal in size. The   respirophasic diameter changes were in the normal range (>= 50%),   consistent with normal central venous pressure.   Neuro/Psych  Neuromuscular disease negative psych ROS   GI/Hepatic Neg liver ROS, hiatal hernia, GERD  ,  Endo/Other  Hypothyroidism   Renal/GU Renal disease  negative genitourinary   Musculoskeletal  (+) Arthritis ,   Abdominal   Peds negative pediatric ROS (+)  Hematology negative hematology ROS (+)   Anesthesia Other Findings   Reproductive/Obstetrics negative OB ROS                           Anesthesia Physical Anesthesia Plan  ASA: III  Anesthesia Plan: MAC   Post-op Pain Management:    Induction: Intravenous  Airway Management Planned: Natural Airway and Simple Face Mask  Additional Equipment:   Intra-op Plan:   Post-operative Plan:   Informed Consent: I have reviewed the patients History and Physical, chart, labs and discussed the procedure including the risks, benefits and alternatives for the proposed anesthesia with the patient or authorized representative who has indicated his/her understanding and acceptance.   Dental advisory given  Plan Discussed with: CRNA  Anesthesia Plan Comments:        Anesthesia Quick Evaluation

## 2016-07-20 NOTE — H&P (Signed)
Robert Rowe is an 82 y.o. male.    Chief Complaint: Pre-op Urethral Dilation  HPI:   1 - Urethral Stricture - s/p office dilation x several of recurrent bulbar stricture per records.   Pre 06/2016 - office cysto and strictrure dilation by Gaynelle Arabian every other month so far in 2017  06/2016 - offcye cysto / dilation to 40F, only mild bulbar SX abtou 16 F pre-dilation.   2 - Prostatic Hypertrophy - s/p TURP 2016 at age 43 per report.   PMH sig for TIA/Plavix. He is retired MD.   Today "Robert Rowe" is seen to proceed with operative urethral dilation. NO interval fevers. He has held plavix.     Past Medical History:  Diagnosis Date  . BENIGN PROSTATIC HYPERTROPHY 04/05/2007  . 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 CHEST DISCOMFORT 04/25/2009  . Chronic diastolic CHF (congestive heart failure) ([place])    a. Echo 12/16: mild LVH, EF 55-60%, no RWMA, Gr 1 DD, mild AI, MAC, mild LAE, normal RVF, PASP 37 mmHg  . Chronic kidney disease    chronic   . DJD (degenerative joint disease)   . Dysrhythmia    pvc's   . GERD 12/03/2007  . Hiatal hernia   . History of nuclear stress test    Myoview 1/17: EF 53%, normal perfusion; Low Risk  . HYPERLIPIDEMIA 04/05/2007  . Hypothyroidism   . Osteoarth NOS-Unspec 04/05/2007  . Palpitations 02/12/2010  . PEDAL EDEMA 05/01/2008  . Rheumatoid arthritis(714.0) 04/05/2007  . Shortness of breath dyspnea   . TRANSIENT ISCHEMIC ATTACK, HX OF 04/05/2007   ?     Past Surgical History:  Procedure Laterality Date  . BALLOON DILATION N/A 03/12/2013   Procedure: BALLOON DILATION;  Surgeon: Inda Castle, MD;  Location: [facility];  Service: Endoscopy;  Laterality: N/A;  . CATARACT EXTRACTION  2001   bilateral  . CYSTOSCOPY WITH URETHRAL DILATATION N/A 09/24/2015   Procedure: CYSTOSCOPY WITH COOK BALLOON DILATATION OF URETHRAL STRICTURE ;  Surgeon: Carolan Clines, MD;  Location: [facility];  Service: Urology;  Laterality: N/A;  .  ESOPHAGOGASTRODUODENOSCOPY N/A 03/12/2013   Procedure: ESOPHAGOGASTRODUODENOSCOPY (EGD);  Surgeon: Inda Castle, MD;  Location: [facility];  Service: Endoscopy;  Laterality: N/A;  . HEMORRHOID SURGERY    . HERNIA REPAIR     inguinal x5  . KNEE SURGERY  arthroscopic   left  . PROSTATE CRYOABLATION     2000  . SHOULDER SURGERY  few years ago   left  . TRANSURETHRAL RESECTION OF PROSTATE N/A 03/19/2015   Procedure: TRANSURETHRAL RESECTION OF THE PROSTATE (TURP);  Surgeon: Carolan Clines, MD;  Location: [facility];  Service: Urology;  Laterality: N/A;    Family History  Problem Relation Age of Onset  . Breast cancer Mother   . Heart disease Father   . Heart attack Father   . Lymphoma Brother   . Stroke Neg Hx   . Hypertension Neg Hx    Social History:  reports that he quit smoking about 62 years ago. His smoking use included Cigarettes. He has never used smokeless tobacco. He reports that he drinks alcohol. He reports that he does not use drugs.  Allergies:  Allergies  Allergen Reactions  . Sulfonamide Derivatives     Patient does not remember allergy symptoms    No prescriptions prior to admission.    Results for orders placed or performed during the hospital encounter of 07/18/16 (from the past 48 hour(s))  Basic metabolic panel     Status: Abnormal   Collection Time: 07/18/16 10:30 AM  Result Value Ref Range   Sodium 136 135 - 145 mmol/L   Potassium 4.9 3.5 - 5.1 mmol/L   Chloride 101 101 - 111 mmol/L   CO2 27 22 - 32 mmol/L   Glucose, Bld 227 (H) 65 - 99 mg/dL   BUN 16 6 - 20 mg/dL   Creatinine, Ser 1.67 (H) 0.61 - 1.24 mg/dL   Calcium 9.3 8.9 - 10.3 mg/dL   GFR calc non Af Amer 33 (L) >60 mL/min   GFR calc Af Amer 38 (L) >60 mL/min    Comment: (NOTE) The eGFR has been calculated using the CKD EPI equation. This calculation has not been validated in all clinical situations. eGFR's persistently <60 mL/min signify possible Chronic Kidney Disease.    Anion gap  8 5 - 15  CBC     Status: Abnormal   Collection Time: 07/18/16 10:30 AM  Result Value Ref Range   WBC 13.1 (H) 4.0 - 10.5 K/uL   RBC 4.61 4.22 - 5.81 MIL/uL   Hemoglobin 13.8 13.0 - 17.0 g/dL   HCT 44.2 39.0 - 52.0 %   MCV 95.9 78.0 - 100.0 fL   MCH 29.9 26.0 - 34.0 pg   MCHC 31.2 30.0 - 36.0 g/dL   RDW 13.3 11.5 - 15.5 %   Platelets 601 (H) 150 - 400 K/uL   No results found.  Review of Systems  Constitutional: Negative.  Negative for fever.  HENT: Negative.   Eyes: Negative.   Respiratory: Negative.   Cardiovascular: Negative.   Gastrointestinal: Negative.   Genitourinary: Positive for frequency and urgency.  Skin: Negative.   Neurological: Negative.   Endo/Heme/Allergies: Negative.   Psychiatric/Behavioral: Negative.     There were no vitals taken for this visit. Physical Exam  Constitutional: He appears well-developed.  HENT:  Hard of hearing  Eyes: Pupils are equal, round, and reactive to light.  Neck: Normal range of motion.  Cardiovascular: Normal rate.   Respiratory: Effort normal.  GI: Soft.  Genitourinary:  Genitourinary Comments: No CVAT  Musculoskeletal: Normal range of motion.  Neurological: He is alert.  Skin: Skin is warm.  Psychiatric: He has a normal mood and affect. His behavior is normal.     Assessment/Plan   1 - Urethral Stricture - proceed as planned with cysto / balloon dilation stricture. Will plan on temporary foley post op to hopefully allow better healing. Risks, benefits, alternatives, peri-op course discussed.   Alexis Frock, MD 07/20/2016, 6:44 AM

## 2016-07-20 NOTE — Transfer of Care (Signed)
Immediate Anesthesia Transfer of Care Note  Patient: Robert Rowe  Procedure(s) Performed: Procedure(s) with comments: CYSTOSCOPY WITH URETHRAL DILATATION (N/A) - 1 HOUR  Patient Location: PACU  Anesthesia Type:MAC  Level of Consciousness: awake, alert  and oriented  Airway & Oxygen Therapy: Patient Spontanous Breathing and Patient connected to face mask oxygen  Post-op Assessment: Report given to RN and Post -op Vital signs reviewed and stable  Post vital signs: Reviewed and stable  Last Vitals:  Vitals:   07/20/16 1121  BP: 135/84  Pulse: 99  Resp: 18  Temp: 36.8 C    Last Pain:  Vitals:   07/20/16 1121  TempSrc: Oral      Patients Stated Pain Goal: 3 ([id number])  Complications: No apparent anesthesia complications

## 2016-07-20 NOTE — Discharge Instructions (Signed)
1 - You may have urinary urgency (bladder spasms) and bloody urine on / off with catheter in place. This is normal.  2 - Resume blood thinners on Monday morning.  3 - Call MD or go to ER for fever >102, severe pain / nausea / vomiting not relieved by medications, or acute change in medical status

## 2016-07-21 NOTE — Progress Notes (Signed)
Call from patient's wife  States she is unable to get thru [facility] today. Discharge instructions. Have follow up appt. For Monday and office is closed trying to get new appt. For catheter removal. Call office and spoke with [place]. She states she just spoke with Mrs. Knipe and took a mesg. For Dr. Tresa Moore to call office nurse. Explained to Hylton. What patient was needing and she said she would call Mrs. Dromgoole back at the number I gave her.

## 2016-07-21 NOTE — Op Note (Signed)
NAMEABIR, CAMDEN                 ACCOUNT NO.:  [id number]  MEDICAL RECORD NO.:  [id number]  LOCATION:  [facility]                         FACILITY:  [facility]  PHYSICIAN:  Alexis Frock, MD     DATE OF BIRTH:  12/14/1917  DATE OF PROCEDURE: 07/20/2016                               OPERATIVE REPORT   DIAGNOSIS:  Recurrent bulbar urethral stricture.  PROCEDURE:  Cystoscopy with urethral dilation and catheter placement, complicated.  ESTIMATED BLOOD LOSS:  Nil.  COMPLICATIONS:  None.  SPECIMEN:  None.  FINDINGS: 1. Approximately 8-French predilation, 24-French postdilation, short     segment, high-grade bulbar urethral stricture. 2. Otherwise unremarkable urinary bladder. 3. Prior TURP defect with wide open prostatic fossa.  DRAINS:  A 24-French Foley catheter to straight drain.  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 dilation.  He has had dilation done operatively as well as in the office.  He has had recurrence of obstructive symptoms consistent with stricture recurrence and adamantly wished to have predilation performed in the operative setting.  Informed consent was obtained and placed in the medical record.  PROCEDURE IN DETAIL:  The patient being Robert Rowe, was verified. Procedure being cysto and urethral dilation was confirmed.  Procedure was carried out.  Time-out was performed.  Intravenous antibiotics were administered.  Monitored anesthesia care, conscious sedation was delivered, sterile field was created by prepping and draping the patient's penis, perineum and proximal thighs using iodine.  Next, cystourethroscopy was performed using a 21-French rigid cystoscope in the bulbar urethra, there was a short-segment and high-grade bulbar urethral stricture,  approximately 6-8-French diameter.  A 0.038 Zip wire was advanced through this and coiled in the urinary bladder, over which, a 24-French NephroMax balloon dilation apparatus was carefully navigated across this, inflated to 20 atmospheres, held for 90 seconds with complete resolution of all waisting of the balloon.  Repeat cystoscopy revealed excellent dilation of the scar with excellent hemostasis. There was a prior TURP defect that was wide open in the urinary bladder was unremarkable.  Bilateral singleton ureteral orifices.  A new 24- French Foley catheter was then placed per urethra to straight drain, 10 mL of sterile water in the balloon.  Procedure was terminated.  The patient tolerated the procedure well.  There were no immediate periprocedural complications.  The patient was taken to the postanesthesia care unit in stable condition.          ______________________________ Alexis Frock, MD     TM/MEDQ  D:  07/20/2016  T:  07/21/2016  Job:  [id number]

## 2016-07-26 DIAGNOSIS — R338 Other retention of urine: Secondary | ICD-10-CM | POA: Diagnosis not present

## 2016-08-01 ENCOUNTER — Telehealth: Payer: Self-pay | Admitting: *Deleted

## 2016-08-01 NOTE — Telephone Encounter (Signed)
"  This is Dr.Ledonne.  I have an appointment there on Thursday and would like to know what this is for."  Advised his PCP referred him to Dr.Kale for elevated wbc and pltc.  "You mean they want to keep me around longer.  This past year has not been fun.  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  How do I get to your office?"  Hopefully we can help make things better.     Directions provided

## 2016-08-04 ENCOUNTER — Ambulatory Visit (HOSPITAL_BASED_OUTPATIENT_CLINIC_OR_DEPARTMENT_OTHER): Payer: Medicare Other | Admitting: Hematology

## 2016-08-04 ENCOUNTER — Encounter: Payer: Self-pay | Admitting: Hematology

## 2016-08-04 ENCOUNTER — Telehealth: Payer: Self-pay | Admitting: Hematology

## 2016-08-04 VITALS — BP 110/57 | HR 54 | Temp 97.5°F | Resp 17 | Wt 146.8 lb

## 2016-08-04 DIAGNOSIS — D75839 Thrombocytosis, unspecified: Secondary | ICD-10-CM

## 2016-08-04 DIAGNOSIS — D473 Essential (hemorrhagic) thrombocythemia: Secondary | ICD-10-CM | POA: Diagnosis not present

## 2016-08-04 DIAGNOSIS — M069 Rheumatoid arthritis, unspecified: Secondary | ICD-10-CM | POA: Diagnosis not present

## 2016-08-04 DIAGNOSIS — D72829 Elevated white blood cell count, unspecified: Secondary | ICD-10-CM | POA: Diagnosis not present

## 2016-08-04 DIAGNOSIS — E039 Hypothyroidism, unspecified: Secondary | ICD-10-CM | POA: Diagnosis not present

## 2016-08-04 NOTE — Telephone Encounter (Signed)
Avs report and appointment schedule given to patient, per 08/04/16 los. °

## 2016-08-04 NOTE — Progress Notes (Signed)
Marland Kitchen    HEMATOLOGY/ONCOLOGY CONSULTATION NOTE  Date of Service: 08/04/2016  Patient Care Team: Marletta Lor, MD as PCP - General  CHIEF COMPLAINTS/PURPOSE OF CONSULTATION:  Leukocytosis and thrombocytosis  HISTORY OF PRESENTING ILLNESS:   Robert Rowe is a wonderful 82 y.o. male who has been referred to [place] by Dr .Nyoka Cowden, MD  for evaluation and management of leukocytosis and thrombocytosis.  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 He has multiple stable chronic medical problems including hypertension, hypothyroidism, osteoarthritis, chronic kidney disease, GERD, chronic diastolic heart failure, rheumatoid arthritis, TIA. He notes notes that he was in his usual state of health until last month when he had discomfort and difficulty passing urine and significant obstructive symptomatology. He reports that his symptoms were very uncomfortable and led to a urologic evaluation that noted urethral stenosis. Patient subsequently had a cystoscopy with urethral dilatation on 07/20/2016 by Dr. Tresa Moore. He reports that he had a urinary catheter in for about a week and it was removed just after Labor Day. He reports that he doesn't remember but believes that he was on Keflex for possible urinary tract infection.  He has had previous cystoscopy with balloon dilatation of urethral stricture by Dr. Gaynelle Arabian on 09/24/2015 and transurethral resection of prostate 03/11/2015. He had been seen by Dr Sabino Niemann on 07/12/2016 and had labs which showed a negative UA. CBC was done which showed a hemoglobin of 12.7, mild leukocytosis with a WBC count of 14.1k with 82.5% neutrophils and thrombocytosis of 518k. Patient was also noted to have a significantly elevated CRP of 171.4 and a sedimentation rate of 16mm hg.  He had repeat CBC done preoperatively on 07/18/2016 which showed that his WBC count was lower at 13.1k with a platelet count of 600k.  Labs available prior  to this with a CBC on 03/24/2016 showed a completely normal CBC with hemoglobin of 15, WBC count of 8.6k and platelets of 234k.  As per outside records the patient was on prednisone 5 mg by mouth daily and methotrexate 7.5 mg once weekly for his rheumatoid arthritis. He does not recall if he is still taking these. Patient is unable to provide any additional details regarding his rheumatoid arthritis that he has chronic joint pains. He was a previous smoker but has quit at age 40 years.  Currently does not note any overt dysuria and feels that his urination is starting to get normal.  No fevers no chills no night sweats and no other acute new focal symptoms. No rare painful swollen joints. No cough no chest pain or shortness of breath.   MEDICAL HISTORY:  Past Medical History:  Diagnosis Date  . BENIGN PROSTATIC HYPERTROPHY 04/05/2007  . 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 CHEST DISCOMFORT 04/25/2009  . Chronic diastolic CHF (congestive heart failure) ([place])    a. Echo 12/16: mild LVH, EF 55-60%, no RWMA, Gr 1 DD, mild AI, MAC, mild LAE, normal RVF, PASP 37 mmHg  . Chronic kidney disease    chronic   . DJD (degenerative joint disease)   . Dysrhythmia    pvc's   . GERD 12/03/2007  . Hiatal hernia   . History of nuclear stress test    Myoview 1/17: EF 53%, normal perfusion; Low Risk  . HYPERLIPIDEMIA 04/05/2007  . Hypothyroidism   . Osteoarth NOS-Unspec 04/05/2007  . Palpitations 02/12/2010  . PEDAL EDEMA 05/01/2008  . Rheumatoid arthritis(714.0) 04/05/2007  .  Shortness of breath dyspnea   . TRANSIENT ISCHEMIC ATTACK, HX OF 04/05/2007   ?     SURGICAL HISTORY: Past Surgical History:  Procedure Laterality Date  . BALLOON DILATION N/A 03/12/2013   Procedure: BALLOON DILATION;  Surgeon: Inda Castle, MD;  Location: [facility];  Service: Endoscopy;  Laterality: N/A;  . CATARACT EXTRACTION  2001   bilateral  . CYSTOSCOPY WITH URETHRAL DILATATION N/A 09/24/2015    Procedure: CYSTOSCOPY WITH COOK BALLOON DILATATION OF URETHRAL STRICTURE ;  Surgeon: Carolan Clines, MD;  Location: [facility];  Service: Urology;  Laterality: N/A;  . CYSTOSCOPY WITH URETHRAL DILATATION N/A 07/20/2016   Procedure: CYSTOSCOPY WITH URETHRAL DILATATION;  Surgeon: Alexis Frock, MD;  Location: [facility];  Service: Urology;  Laterality: N/A;  1 HOUR  . ESOPHAGOGASTRODUODENOSCOPY N/A 03/12/2013   Procedure: ESOPHAGOGASTRODUODENOSCOPY (EGD);  Surgeon: Inda Castle, MD;  Location: [facility];  Service: Endoscopy;  Laterality: N/A;  . HEMORRHOID SURGERY    . HERNIA REPAIR     inguinal x5  . KNEE SURGERY  arthroscopic   left  . PROSTATE CRYOABLATION     2000  . SHOULDER SURGERY  few years ago   left  . TRANSURETHRAL RESECTION OF PROSTATE N/A 03/19/2015   Procedure: TRANSURETHRAL RESECTION OF THE PROSTATE (TURP);  Surgeon: Carolan Clines, MD;  Location: [facility];  Service: Urology;  Laterality: N/A;    SOCIAL HISTORY: Social History   Social History  . Marital status: Married    Spouse name: N/A  . Number of children: N/A  . Years of education: N/A   Occupational History  . Not on file.   Social History Main Topics  . Smoking status: Former Smoker    Types: Cigarettes    Quit date: 08/13/1953  . Smokeless tobacco: Never Used  . Alcohol use Yes     Comment: occ vodka  . Drug use: No  . Sexual activity: Not on file   Other Topics Concern  . Not on file   Social History Narrative  . No narrative on file    FAMILY HISTORY: Family History  Problem Relation Age of Onset  . Breast cancer Mother   . Heart disease Father   . Heart attack Father   . Lymphoma Brother   . Stroke Neg Hx   . Hypertension Neg Hx     ALLERGIES:  is allergic to sulfonamide derivatives.  MEDICATIONS:  Current Outpatient Prescriptions  Medication Sig Dispense Refill  . atorvastatin (LIPITOR) 10 MG tablet Take 1 tablet (10 mg total) by mouth daily. 90 tablet 3  . furosemide (LASIX)  40 MG tablet Take 20 mg by mouth daily as needed for fluid or edema.   11  . hydrocortisone 2.5 % cream Apply to affected area daily.  0  . levothyroxine (SYNTHROID, LEVOTHROID) 50 MCG tablet TAKE 1 TABLET (50 MCG TOTAL) BY MOUTH DAILY. 90 tablet 0  . metoprolol succinate (TOPROL-XL) 25 MG 24 hr tablet Take 1 tablet (25 mg total) by mouth at bedtime. 90 tablet 3  . Multiple Vitamin (MULTIVITAMIN WITH MINERALS) TABS tablet Take 1 tablet by mouth 3 (three) times a week.    . neomycin-polymyxin-dexamethasone (MAXITROL) 0.1 % ophthalmic suspension Place 2 drops into both eyes 4 (four) times daily. (Patient taking differently: Place 1 drop into both eyes daily. "maybe twice a day sometimes") 5 mL 3  . nitroGLYCERIN (NITROSTAT) 0.4 MG SL tablet Place 0.4 mg under the tongue every 5 (five) minutes as needed (ESOPHEAGEAL SPASPMS).  Reported on 03/01/2016    . pantoprazole (PROTONIX) 40 MG tablet Take 1 tablet (40 mg total) by mouth daily. 90 tablet 3   No current facility-administered medications for this visit.     REVIEW OF SYSTEMS:    10 Point review of Systems was done is negative except as noted above.  PHYSICAL EXAMINATION: ECOG PERFORMANCE STATUS: 2 - Symptomatic, <50% confined to bed  . Vitals:   08/04/16 1410  BP: (!) 110/57  Pulse: (!) 54  Resp: 17  Temp: 97.5 F (36.4 C)   Filed Weights   08/04/16 1410  Weight: 146 lb 12.8 oz (66.6 kg)   .Body mass index is 26.85 kg/m.  GENERAL:alert,Elderly gentleman in no acute distress and comfortable SKIN: skin color, texture, turgor are normal, no rashes or significant lesions EYES: normal, conjunctiva are pink and non-injected, sclera clear OROPHARYNX:no exudate, no erythema and lips, buccal mucosa, and tongue normal  NECK: supple, no JVD, thyroid normal size, non-tender, without nodularity LYMPH:  no palpable lymphadenopathy in the cervical, axillary or inguinal LUNGS: clear to auscultation with normal respiratory effort HEART:  regular rate & rhythm,  no murmurs and no lower extremity edema ABDOMEN: abdomen soft, non-tender, normoactive bowel sounds , No palpable hepatosplenomegaly Musculoskeletal: no cyanosis of digits and no clubbing  PSYCH: alert & oriented x 3 with fluent speech NEURO: no focal motor/sensory deficits  LABORATORY DATA:  I have reviewed the data as listed  . CBC Latest Ref Rng & Units 07/18/2016 03/24/2016 11/15/2015  WBC 4.0 - 10.5 K/uL 13.1(H) 8.6 7.1  Hemoglobin 13.0 - 17.0 g/dL 13.8 15.0 14.0  Hematocrit 39.0 - 52.0 % 44.2 44.7 41.6  Platelets 150 - 400 K/uL 601(H) 234.0 218    . CMP Latest Ref Rng & Units 07/18/2016 03/24/2016 12/09/2015  Glucose 65 - 99 mg/dL 227(H) 95 111(H)  BUN 6 - 20 mg/dL 16 21 24   Creatinine 0.61 - 1.24 mg/dL 1.67(H) 1.52(H) 1.64(H)  Sodium 135 - 145 mmol/L 136 137 139  Potassium 3.5 - 5.1 mmol/L 4.9 4.3 4.0  Chloride 101 - 111 mmol/L 101 100 101  CO2 22 - 32 mmol/L 27 28 27   Calcium 8.9 - 10.3 mg/dL 9.3 9.5 9.4  Total Protein 6.0 - 8.3 g/dL - 6.3 -  Total Bilirubin 0.2 - 1.2 mg/dL - 0.6 -  Alkaline Phos 39 - 117 U/L - 72 -  AST 0 - 37 U/L - 14 -  ALT 0 - 53 U/L - 11 -     RADIOGRAPHIC STUDIES: I have personally reviewed the radiological images as listed and agreed with the findings in the report. No results found.  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 The current neutrophilia and thrombocytosis have been noted in the setting of urinary discomfort from significantly symptomatic urethral stenosis with urinary retention and possible urinary tract infection. This has required surgical intervention with cystoscopy and ureteral dilatation. The patient also has been on prednisone for his rheumatoid arthritis and likely on methotrexate. Uncertain if he is still on these. He had a  significantly elevated CRP level on recent labs with his primary care physician.  Patient did not have any clinically palpable hepatosplenomegaly. No easily palpable lymphadenopathy.  He feels good overall. Overall the leukocytosis and thrombocytosis are likely to be reactive from his recent urinary distress and surgical procedure. Steroid use for rheumatoid  arthritis can also cause neutrophilia. He appears to have significantly elevated inflammatory marker/CRP and potentially might have suboptimally controlled rheumatoid arthritis. Plan -I offered the patient having repeat labs today with CBC, CMP, peripheral blood smear, LDH  to determine if there is WBC count and platelets are still elevated or if these counts have resolved. He is on the couple of weeks out from surgery and these could still be elevated reactively. We discussed that if these are still elevated with could send out clonal markers including JAk2 [id number] and  CALR and MPL to r/o ET/other MPN. -Patient reports that he is planning to travel to [place] on a cruise later this month and would not like to have any additional lab testing prior to that and would like to follow up after he comes back from his vacation to get these labs which is quite reasonable. -We would recommend he follow up with his rheumatologist/primary care physician to optimize control of his rheumatoid arthritis which is uncontrolled and causing significant systemic inflammation could also cause his current blood picture. -He was counseled to look out for any other signs of infection and report these to his primary care physician if noted. If he has dysuria likely need a repeat UA/UC with his primary care physician in the setting of a recent indwelling urinary catheter. -Patient continues to be on Plavix given his previous history of CVA which is quite reasonable.  Return to clinic with Dr. Irene Limbo in 2-3 months with labs to monitor for resolution of leukocytosis and  thrombocytosis and for further evaluation if these are persistent . Earlier if any other new questions or concerns arise .  All of the patients questions were answered with apparent satisfaction. The patient knows to call the clinic with any problems, questions or concerns.  I spent 45 minutes counseling the patient face to face. The total time spent in the appointment was 60 minutes and more than 50% was on counseling and direct patient cares.    Robert Lone MD [place] AAHIVMS [facility] Hematology/Oncology Physician [facility]  (Office):       [phone] (Work cell):  [phone] (Fax):           [phone]  08/04/2016 3:22 PM

## 2016-08-15 ENCOUNTER — Telehealth: Payer: Self-pay | Admitting: Neurology

## 2016-08-15 NOTE — Telephone Encounter (Signed)
Rolando Hessling 22-Jul-2018. He has an appointment at 10:30 tomorrow and wanted to know could he come around 11:30? I did explain she had no cancellations and had other patient's already scheduled. He would like you to please call him. His # is [phone]. Thank you

## 2016-08-15 NOTE — Telephone Encounter (Signed)
Notified pt that Per Dr. Delice Lesch he can come at 11:30 but will have to wait until 11:30 pt has been seen.

## 2016-08-16 ENCOUNTER — Encounter: Payer: Self-pay | Admitting: Neurology

## 2016-08-16 ENCOUNTER — Ambulatory Visit (INDEPENDENT_AMBULATORY_CARE_PROVIDER_SITE_OTHER): Payer: Medicare Other | Admitting: Neurology

## 2016-08-16 VITALS — BP 118/76 | HR 84 | Temp 97.6°F | Ht 62.0 in | Wt 148.0 lb

## 2016-08-16 DIAGNOSIS — G450 Vertebro-basilar artery syndrome: Secondary | ICD-10-CM

## 2016-08-16 DIAGNOSIS — G3184 Mild cognitive impairment, so stated: Secondary | ICD-10-CM | POA: Diagnosis not present

## 2016-08-16 DIAGNOSIS — I679 Cerebrovascular disease, unspecified: Secondary | ICD-10-CM

## 2016-08-16 MED ORDER — RIVASTIGMINE TARTRATE 1.5 MG PO CAPS: 1.5000 mg | ORAL_CAPSULE | Freq: Two times a day (BID) | ORAL | 6 refills | Status: DC

## 2016-08-16 NOTE — Progress Notes (Signed)
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 He is again accompanied by his wife who helps supplement the history. The patient was last seen 5 months ago. He was initially seen for memory changes, then last November 2016 his family expressed concern about bilateral leg weakness with slurred speech. MRA head and neck showed intracranial stenosis, with significant intracranial stenosis in basilar, right ICA cavernous/supraclinoid junction, moderate narrowing in bilateral ACA, right ACA, distal right vertebral, bilateral PCA stenosis. Recurrent symptoms concerning for hypoperfusion causing recurrent TIAs. He was advised to take Plavix regularly instead of 5 days of the week. He declined lipid-lowering agents at that time. He presents today reporting a TIA last 06/26/16, he was out with friends and his wife when he suddenly slumped and became unresponsive for less than 2 minutes. He denied any warning, and woke up EMS around him, refusing to go to the ER. They report that his vital signs were normal. There is report of right facial weakness, his wife cannot recall this but states that when he came to, he was not making sense, but was talking fine on EMS arrival. He saw his PCP and was given a prescription for atorvastatin, which he did not start, stating "I don't care." He denies any further similar episodes since then.  He continues to notice his short-term memory is "not good." MMSE in December 2016 was 27/30. He did not tolerate Aricept due to GI issues. He report arthritis issues are improved, he denies any pain. He has not driven since the TIA. He continues to manage his medications without difficulties. He denies any headaches, dizziness, diplopia, focal numbness/tingling/weakness, olfactory/gustatory hallucinations, myoclonic jerks.   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 He reports that he started having recurrent symptoms in 2001, he was driving to his dentist when a peculiar feeling came over him, "running round in my head," for a couple of minutes. He denied any focal symptoms, he was able to drive without any confusion. He was evaluated at that time with an MRI brain reported as normal. He had another episode in 2004 with a normal EEG. He reports a history of hypoglycemia where he gets shaky, and that his blood glucose levels and BP were unremarkable during these episodes. He did well for several years until 2013 when he had 4 spells of confusion where a sensation of "ideas repeating over and over in his brain" occurred. There is note that he may have had weakness in the right arm with one of these spells. No associated headache. He was evaluated by neurologist [name], routine EEG normal. MRI brain did not show any acute changes, I personally reviewed scan which showed diffuse atrophy with likely ex vacuo ventriculomegaly, MRA head showed mild intracranial atherosclerosis in the left PCA, distal flow preserved. He again had no further episodes for another 3 years until March 2016 while his wife was driving, she asked him to roll his window down. He reports he was taking a nap, as as he was waking up, he was trying to reach for the window with his right hand but could not find it (eyes were closed per patient because he was just waking up), then his wife noticed he seemed to be waving his  right hand in the air and was trying to talk but could not get words out. He recalls this episode and denied any confusion. It only lasted a few seconds. The second episode occurred a week or so after, he woke up from his nap and was again unable to talk for a few seconds. The last episode occurred 02/22/15 while in a restaurant. They report that he had  not eaten that evening, he spoke at a meeting, then went to the restaurant after. He recalls feeling sleepy and went to sleep, but his wife reports that he suddenly slumped down then to the wall on his right side, eyes closed for a few seconds. She called his name and he started to sit up and drank from his cup. His wife reports he was perfectly normal soon after and did not appear confused or drowsy. He denied any associated focal numbness/tingling, or post-event weakness, no headaches, dizziness. He denies any other episodes of speech difficulties. He feels these may be hypoglycemic episodes, and has been advised to use a glucometer in the past but has not done this.   His routine EEG was normal. 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 I had discussed findings with [name] on the phone, likely due to small vessel disease, and controlling vascular risk factors. He is aware of elevated cholesterol but did not want to start any medication. He continues on Plavix taken 5 times a week due to easy bruising and bleeding (when he was having TURP).   On his visit in November 2016, his wife and son reported a change in symptoms over the course of a month. On 08/27/15, he was alone at home watching TV, when he suddenly started "feeling down," then he broke out in a sweat. He felt temporarily confused. He called his wife then called EMS and was brought to [facility] ER, on arrival he was oriented x 3, BP 130/82, HR 78. EKG reported sinus rhythm, multiple premature compexes, vent; borderline repolarization abnormality; no significant change since last tracing. CBG 107, CBC and BMP unremarkable except for creatinine of 1.41. Urinalysis negative. He wanted to go home and was discharged at baseline. His family reports that since then, he has had 8 episodes where he  suddenly gets weak and if standing, would start listing to his left. His wife tried to help him one time and they both ended up on the floor. He could not move either leg. His son came one time and could not get him up, it appeared he could not move his legs, so he crawled to a stool, then was able to get to his knees, then after a few minutes he felt better and stood up and started walking. Last episode was 3 days ago. His wife also notices that his speech would get very slurred when there episodes occur.   PAST MEDICAL HISTORY: Past Medical History:  Diagnosis Date  . BENIGN PROSTATIC HYPERTROPHY 04/05/2007  . 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 CHEST DISCOMFORT 04/25/2009  . Chronic diastolic CHF (congestive heart failure) ([place])    a. Echo 12/16: mild LVH, EF 55-60%, no RWMA, Gr 1 DD, mild AI, MAC, mild LAE, normal RVF, PASP 37 mmHg  . Chronic kidney disease    chronic   . DJD (degenerative joint disease)   . Dysrhythmia  pvc's   . GERD 12/03/2007  . Hiatal hernia   . History of nuclear stress test    Myoview 1/17: EF 53%, normal perfusion; Low Risk  . HYPERLIPIDEMIA 04/05/2007  . Hypothyroidism   . Osteoarth NOS-Unspec 04/05/2007  . Palpitations 02/12/2010  . PEDAL EDEMA 05/01/2008  . Rheumatoid arthritis(714.0) 04/05/2007  . Shortness of breath dyspnea   . TRANSIENT ISCHEMIC ATTACK, HX OF 04/05/2007   ?     MEDICATIONS: Current Outpatient Prescriptions on File Prior to Visit  Medication Sig Dispense Refill  . atorvastatin (LIPITOR) 10 MG tablet Take 1 tablet (10 mg total) by mouth daily. 90 tablet 3  . furosemide (LASIX) 40 MG tablet Take 20 mg by mouth daily as needed for fluid or edema.   11  . hydrocortisone 2.5 % cream Apply to affected area daily.  0  . levothyroxine (SYNTHROID, LEVOTHROID) 50 MCG tablet TAKE 1 TABLET (50 MCG TOTAL) BY MOUTH DAILY. 90 tablet 0  . metoprolol succinate (TOPROL-XL) 25 MG 24 hr tablet Take 1 tablet (25 mg total) by mouth at  bedtime. 90 tablet 3  . Multiple Vitamin (MULTIVITAMIN WITH MINERALS) TABS tablet Take 1 tablet by mouth 3 (three) times a week.    . neomycin-polymyxin-dexamethasone (MAXITROL) 0.1 % ophthalmic suspension Place 2 drops into both eyes 4 (four) times daily. (Patient taking differently: Place 1 drop into both eyes daily. "maybe twice a day sometimes") 5 mL 3  . nitroGLYCERIN (NITROSTAT) 0.4 MG SL tablet Place 0.4 mg under the tongue every 5 (five) minutes as needed (ESOPHEAGEAL SPASPMS). Reported on 03/01/2016    . pantoprazole (PROTONIX) 40 MG tablet Take 1 tablet (40 mg total) by mouth daily. 90 tablet 3   No current facility-administered medications on file prior to visit.     ALLERGIES: Allergies  Allergen Reactions  . Sulfonamide Derivatives     Patient does not remember allergy symptoms    FAMILY HISTORY: Family History  Problem Relation Age of Onset  . Breast cancer Mother   . Heart disease Father   . Heart attack Father   . Lymphoma Brother   . Stroke Neg Hx   . Hypertension Neg Hx     SOCIAL HISTORY: Social History   Social History  . Marital status: Married    Spouse name: N/A  . Number of children: N/A  . Years of education: N/A   Occupational History  . Not on file.   Social History Main Topics  . Smoking status: Former Smoker    Types: Cigarettes    Quit date: 08/13/1953  . Smokeless tobacco: Never Used  . Alcohol use Yes     Comment: occ vodka  . Drug use: No  . Sexual activity: Not on file   Other Topics Concern  . Not on file   Social History Narrative  . No narrative on file    REVIEW OF SYSTEMS: Constitutional: No fevers, chills, or sweats, no generalized fatigue, change in appetite Eyes: No visual changes, double vision, eye pain Ear, nose and throat: No hearing loss, ear pain, nasal congestion, sore throat Cardiovascular: No chest pain, palpitations Respiratory:  No shortness of breath at rest or with exertion, wheezes GastrointestinaI: No  nausea, vomiting, diarrhea, abdominal pain, fecal incontinence Genitourinary:  No dysuria, urinary retention or frequency Musculoskeletal:  No neck pain, back pain Integumentary: No rash, pruritus, skin lesions Neurological: as above Psychiatric: No depression, insomnia, anxiety Endocrine: No palpitations, fatigue, diaphoresis, mood swings, change in appetite, change in weight,  increased thirst Hematologic/Lymphatic:  No anemia, purpura, petechiae. Allergic/Immunologic: no itchy/runny eyes, nasal congestion, recent allergic reactions, rashes  PHYSICAL EXAM: Vitals:   08/16/16 1033  BP: 118/76  Pulse: 84  Temp: 97.6 F (36.4 C)   General: No acute distress, appears fatigued today, not his usual upbeat self Head:  Normocephalic/atraumatic Neck: supple, no paraspinal tenderness, full range of motion Heart:  Regular rate and rhythm Lungs:  Clear to auscultation bilaterally Back: No paraspinal tenderness Skin/Extremities: No rash, no edema Neurological Exam: alert and oriented to person, place, and time. No aphasia or dysarthria. Fund of knowledge is appropriate.  Recent and remote memory are intact. 0/3 delayed recall.  Attention and concentration are normal.    Able to name objects and repeat phrases.  MMSE - Mini Mental State Exam 08/16/2016 11/09/2015  Orientation to time 5 5  Orientation to Place 5 5  Registration 3 3  Attention/ Calculation 5 5  Recall 0 0  Language- name 2 objects 2 2  Language- repeat 1 1  Language- follow 3 step command 3 3  Language- read & follow direction 1 1  Write a sentence 1 1  Copy design 1 1  Total score 27 27   Cranial nerves: Pupils equal, round, reactive to light.  Extraocular movements intact with no nystagmus. Visual fields full. Facial sensation intact. No facial asymmetry. Tongue, uvula, palate midline.  Motor: Bulk and tone normal, muscle strength 5/5 throughout with no pronator drift.  Sensation to light touch intact.  No extinction to  double simultaneous stimulation.  Deep tendon reflexes 2+ throughout, toes downgoing.  Finger to nose testing intact.  Gait slow and cautious due to arthritis pain.   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 He has had an unremarkable MRI brain and routine EEG in 2013, symptom-free until March 2016, with the last episode, he apparently very briefly slumped down to his right side. His routine EEG was normal. 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 In November 2016, his family reported episodes of bilateral leg weakness and slurred speech. MRA head and neck showed significant intracranial stenosis in the anterior and posterior circulation. He had another episode of loss of consciousness last 06/26/16 concerning for vertebrobasilar TIA. He is on Plavix and agrees to start statin. We again discussed maximum medical management, he was advised to keep hydrated. He continues to have memory changes, MMSE today again 27/30. His wife is interested in starting cholinesterase inhibitors, he is agreeable to starting Exelon 1.5mg  BID, side effects were discussed. We discussed [place] driving laws that indicate that one should not drive after an episode of loss of consciousness until 6 months event-free. He will follow-up in 6 months and knows to call for any changes.  Thank you for allowing me to participate in his care.  Please do not hesitate to call for any questions or concerns.  The duration of this appointment visit was 25 minutes of face-to-face time with the patient.  Greater than 50% of this time was spent in counseling, explanation of diagnosis, planning of further management, and coordination of care.   Ellouise Newer, M.D.   CC: Dr. Burnice Logan

## 2016-08-16 NOTE — Patient Instructions (Addendum)
1. Continue Plavix daily. Start atorvastatin 10mg  daily. 2. Start Exelon 1.5mg : take 1 capsule twice a day 3. As per [place] driving laws, no driving after an episode of loss of consciousness, until 6 months event-free 4. Physical exercise and brain stimulation exercises are important for brain health 5. Follow-up in 6 months

## 2016-08-17 ENCOUNTER — Telehealth: Payer: Self-pay | Admitting: Neurology

## 2016-08-17 NOTE — Telephone Encounter (Signed)
Contacted Robert Rowe. He expressed concern with side effects of Exelon he was prescribed at [facility] . He also wanted to make [place] aware that medication will cost him more than $2.00 a day. Robert Rowe is unsure if he wants to take medication but wants to know if you recommend he take it for a month to see if there are is change. Please advise.

## 2016-08-17 NOTE — Telephone Encounter (Signed)
Patient wants to talk to someone about his medication please call [phone]

## 2016-08-19 NOTE — Telephone Encounter (Signed)
Let's try for a month then see how he tolerates it. Thanks

## 2016-08-19 NOTE — Telephone Encounter (Signed)
Notified pt, pt verbalized understanding.  

## 2016-08-30 DIAGNOSIS — M150 Primary generalized (osteo)arthritis: Secondary | ICD-10-CM | POA: Diagnosis not present

## 2016-08-30 DIAGNOSIS — M0609 Rheumatoid arthritis without rheumatoid factor, multiple sites: Secondary | ICD-10-CM | POA: Diagnosis not present

## 2016-08-30 DIAGNOSIS — N19 Unspecified kidney failure: Secondary | ICD-10-CM | POA: Diagnosis not present

## 2016-09-09 ENCOUNTER — Other Ambulatory Visit: Payer: Self-pay | Admitting: *Deleted

## 2016-09-09 MED ORDER — NEOMYCIN-POLYMYXIN-DEXAMETH 0.1 % OP SUSP: 2.0000 [drp] | Freq: Four times a day (QID) | OPHTHALMIC | 3 refills | Status: DC

## 2016-09-13 ENCOUNTER — Other Ambulatory Visit: Payer: Self-pay | Admitting: *Deleted

## 2016-09-13 MED ORDER — LEVOTHYROXINE SODIUM 50 MCG PO TABS: ORAL_TABLET | ORAL | 3 refills | Status: DC

## 2016-10-18 ENCOUNTER — Telehealth: Payer: Self-pay | Admitting: Internal Medicine

## 2016-10-18 NOTE — Telephone Encounter (Signed)
° °  Pt would like a call back said he has some questions about a physical

## 2016-10-18 NOTE — Telephone Encounter (Signed)
Spoke to pt, he was on another call. Told him I will call back tomorrow. Pt verbalized understanding.

## 2016-10-19 DIAGNOSIS — N35010 Post-traumatic urethral stricture, male, meatal: Secondary | ICD-10-CM | POA: Diagnosis not present

## 2016-10-20 NOTE — Telephone Encounter (Signed)
Spoke to pt, wanted to know when due for Physical. Told him looks like labs were done last May so I would schedule an appointment in May for Physical. Pt verbalized understanding.

## 2016-10-26 ENCOUNTER — Telehealth: Payer: Self-pay | Admitting: *Deleted

## 2016-10-26 NOTE — Telephone Encounter (Signed)
"  This is [name].  I need to know  Why I have an appointment December 14 th.  I do not have cancer or a blood disorder.  I am 82 years old and am not sure it's worth it or know what the point is.  It cost me $50.00 every time I see one of my many doctors.  I also am inconveniencing someone to drive me in and wait for me for these appointments.  Call me at [phone] give me a yeah or nay as to whether I need this appointment.  I can use this $50.00 on something else. "  This appointment is to see if anything has resolved of his blood counts.  "I have blood checked at other doctors offices."

## 2016-10-26 NOTE — Telephone Encounter (Signed)
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 Patient states that he is comfortable and would prefer if he does not have to make this trip. Appointment cancelled.

## 2016-10-31 ENCOUNTER — Other Ambulatory Visit: Payer: Self-pay | Admitting: Internal Medicine

## 2016-11-01 ENCOUNTER — Encounter: Payer: Self-pay | Admitting: Internal Medicine

## 2016-11-01 ENCOUNTER — Ambulatory Visit (INDEPENDENT_AMBULATORY_CARE_PROVIDER_SITE_OTHER): Payer: Medicare Other | Admitting: Internal Medicine

## 2016-11-01 VITALS — BP 124/68 | HR 98 | Ht 62.0 in | Wt 152.0 lb

## 2016-11-01 DIAGNOSIS — I10 Essential (primary) hypertension: Secondary | ICD-10-CM | POA: Insufficient documentation

## 2016-11-01 DIAGNOSIS — I493 Ventricular premature depolarization: Secondary | ICD-10-CM | POA: Diagnosis not present

## 2016-11-01 DIAGNOSIS — I5032 Chronic diastolic (congestive) heart failure: Secondary | ICD-10-CM | POA: Insufficient documentation

## 2016-11-01 DIAGNOSIS — R0600 Dyspnea, unspecified: Secondary | ICD-10-CM

## 2016-11-01 DIAGNOSIS — R0609 Other forms of dyspnea: Secondary | ICD-10-CM | POA: Diagnosis not present

## 2016-11-01 NOTE — Patient Instructions (Signed)
Your physician has recommended that you have a pulmonary function test @ [facility]. Pulmonary Function Tests are a group of tests that measure how well air moves in and out of your lungs.  Your physician recommends that you schedule a follow-up appointment in: [place] with Dr. [name]

## 2016-11-01 NOTE — Progress Notes (Signed)
Cardiology Office Note   Date:  11/01/2016   ID:  Robert Rowe, DOB 1918/11/02, MRN [id number]  PCP:  Nyoka Cowden, MD  Cardiologist: Darlin Coco MD  CC: Dyspnea   History of Present Illness: Robert Rowe is a 82 y.o. male who presents for scheduled follow-up office visit.  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 He has had numerous endoscopies and esophageal dilatations in the past. He does have a history of chest pain often triggered by alcohol ingestion or overeating. He had chest pain at [facility] in 2014. Stress test there was negative. Last seen by Dr. [name] 8/16.  Admitted 11/15/15 with chest pain. He had minimal relief with nitroglycerin at home. Cardiac enzymes remained negative. Telemetry was unremarkable. PVCs were noted.   He was seen 12/16 for volume excess. He was placed on Lasix. Echo demonstrated normal LVF, mild diastolic dysfunction, mild to mod pulmonary HTN.   He has been worried about his renal function.  He has tried to stay off of meloxicam as much as possible and he is also trying to cut back on his Protonix.  He has furosemide 40 mg on hand but does not take it every day.  He skips it if he is traveling.  The patient had blood work on 01/14/16 which showed that his renal function is stable with creatinine 1.6 to and his B natruretic peptide was normal at 36.5.  He has had mild bilateral peripheral edema which we have suspected is secondary to venous insufficiency rather than to ongoing congestive heart failure..  05/04/2016  I the pleasure of meeting [name] today in the office. He is a former patient of Dr. 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 He was one of the original medical students at [employer]. He has no known cardiac history but does have a history of high  cholesterol and possible diastolic dysfunction. He takes Lasix as needed for shortness of breath and swelling. He tends to use it as needed.  11/01/2016  [name] returns today for follow-up. He reports that his main concern is dyspnea. He says this is been present for months but recently is he comes somewhat worse. Workup earlier this year indicated stable systolic function with a low BNP of 36.5. He has chronic venous insufficiency which is likely the cause of his lower extremity swelling. He denies any orthopnea or PND. He does demonstrate some pursed lip breathing today in the office. He denies any wheezing, cough or chest pain. Weight is up about 4 pounds since his last office visit that he reports that he thinks she's been "eating too well".   Past Medical History:  Diagnosis Date  . BENIGN PROSTATIC HYPERTROPHY 04/05/2007  . 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 CHEST DISCOMFORT 04/25/2009  . Chronic diastolic CHF (congestive heart failure) ([place])    a. Echo 12/16: mild LVH, EF 55-60%, no RWMA, Gr 1 DD, mild AI, MAC, mild LAE, normal RVF, PASP 37 mmHg  . Chronic kidney disease    chronic   . DJD (degenerative joint disease)   . Dysrhythmia    pvc's   . GERD 12/03/2007  . Hiatal hernia   . History of nuclear stress test    Myoview 1/17: EF 53%, normal perfusion; Low Risk  .  HYPERLIPIDEMIA 04/05/2007  . Hypothyroidism   . Osteoarth NOS-Unspec 04/05/2007  . Palpitations 02/12/2010  . PEDAL EDEMA 05/01/2008  . Rheumatoid arthritis(714.0) 04/05/2007  . Shortness of breath dyspnea   . TRANSIENT ISCHEMIC ATTACK, HX OF 04/05/2007   ?     Past Surgical History:  Procedure Laterality Date  . BALLOON DILATION N/A 03/12/2013   Procedure: BALLOON DILATION;  Surgeon: Inda Castle, MD;  Location: [facility];  Service: Endoscopy;  Laterality: N/A;  . CATARACT EXTRACTION  2001   bilateral  . CYSTOSCOPY WITH URETHRAL DILATATION N/A 09/24/2015   Procedure: CYSTOSCOPY WITH  COOK BALLOON DILATATION OF URETHRAL STRICTURE ;  Surgeon: Carolan Clines, MD;  Location: [facility];  Service: Urology;  Laterality: N/A;  . CYSTOSCOPY WITH URETHRAL DILATATION N/A 07/20/2016   Procedure: CYSTOSCOPY WITH URETHRAL DILATATION;  Surgeon: Alexis Frock, MD;  Location: [facility];  Service: Urology;  Laterality: N/A;  1 HOUR  . ESOPHAGOGASTRODUODENOSCOPY N/A 03/12/2013   Procedure: ESOPHAGOGASTRODUODENOSCOPY (EGD);  Surgeon: Inda Castle, MD;  Location: [facility];  Service: Endoscopy;  Laterality: N/A;  . HEMORRHOID SURGERY    . HERNIA REPAIR     inguinal x5  . KNEE SURGERY  arthroscopic   left  . PROSTATE CRYOABLATION     2000  . SHOULDER SURGERY  few years ago   left  . TRANSURETHRAL RESECTION OF PROSTATE N/A 03/19/2015   Procedure: TRANSURETHRAL RESECTION OF THE PROSTATE (TURP);  Surgeon: Carolan Clines, MD;  Location: [facility];  Service: Urology;  Laterality: N/A;     Current Outpatient Prescriptions  Medication Sig Dispense Refill  . clopidogrel (PLAVIX) 75 MG tablet Take 1 tablet (75 mg total) by mouth daily. 90 tablet 1  . FLUZONE HIGH-DOSE 0.5 ML SUSY ADM 0.5ML IM UTD  0  . furosemide (LASIX) 20 MG tablet Take 20 mg by mouth daily as needed.  10  . hydrocortisone 2.5 % cream Apply to affected area daily.  0  . levothyroxine (SYNTHROID, LEVOTHROID) 50 MCG tablet TAKE 1 TABLET (50 MCG TOTAL) BY MOUTH DAILY. 90 tablet 3  . metoprolol succinate (TOPROL-XL) 25 MG 24 hr tablet Take 1 tablet (25 mg total) by mouth at bedtime. 90 tablet 3  . Multiple Vitamin (MULTIVITAMIN WITH MINERALS) TABS tablet Take 1 tablet by mouth 3 (three) times a week.    . neomycin-polymyxin-dexamethasone (MAXITROL) 0.1 % ophthalmic suspension Place 2 drops into both eyes 4 (four) times daily. 5 mL 3  . nitroGLYCERIN (NITROSTAT) 0.4 MG SL tablet Place 0.4 mg under the tongue every 5 (five) minutes as needed (ESOPHEAGEAL SPASPMS). Reported on 03/01/2016    . pantoprazole (PROTONIX) 40 MG tablet  Take 1 tablet (40 mg total) by mouth daily. 90 tablet 3  . predniSONE (DELTASONE) 5 MG tablet Take 5 mg by mouth daily.    . rivastigmine (EXELON) 1.5 MG capsule Take 1 capsule (1.5 mg total) by mouth 2 (two) times daily. 60 capsule 6   No current facility-administered medications for this visit.     Allergies:   Sulfonamide derivatives    Social History:  The patient  reports that he quit smoking about 63 years ago. His smoking use included Cigarettes. He has never used smokeless tobacco. He reports that he drinks alcohol. He reports that he does not use drugs.   Family History:  The patient's family history includes Breast cancer in his mother; Heart attack in his father; Heart disease in his father; Lymphoma in his brother.    ROS:  Please see the history of present illness.   Otherwise, review of systems are positive for none.   All other systems are reviewed and negative.    PHYSICAL EXAM: VS:  BP 124/68   Pulse 98   Ht 5\' 2"  (1.575 m)   Wt 152 lb (68.9 kg)   BMI 27.80 kg/m  , BMI Body mass index is 27.8 kg/m. GEN: Well nourished, well developed, in no acute distress HEENT: normal Neck: no JVD, carotid bruits, or masses Cardiac: RRR; no murmurs, rubs, or gallops Respiratory:  clear to auscultation bilaterally, normal work of breathing GI: soft, nontender, nondistended, + BS EXT: no deformity or atrophy, 1+ edema bilaterally Skin: warm and dry, no rash Neuro:  Strength and sensation are intact Psych: euthymic mood, full affect  EKG:   Sinus rhythm with PVCs at 98  Recent Labs: 11/15/2015: Magnesium 1.9 03/24/2016: ALT 11; TSH 3.05 07/18/2016: BUN 16; Creatinine, Ser 1.67; Hemoglobin 13.8; Platelets 601; Potassium 4.9; Sodium 136    Lipid Panel    Component Value Date/Time   CHOL 255 (H) 03/24/2016 1328   TRIG 327.0 (H) 03/24/2016 1328   HDL 37.90 (L) 03/24/2016 1328   CHOLHDL 7 03/24/2016 1328   VLDL 65.4 (H) 03/24/2016 1328   LDLCALC 116 (H) 11/15/2015 0421    LDLDIRECT 175.0 03/24/2016 1328      Wt Readings from Last 3 Encounters:  11/01/16 152 lb (68.9 kg)  08/16/16 148 lb (67.1 kg)  08/04/16 146 lb 12.8 oz (66.6 kg)      ASSESSMENT: 1. Dyspnea 2. Hypertension 3. Chronic diastolic heart failure 4. CKD e3 5. PVCs  PLAN: 1. [name] reports chronic dyspnea. He does not appear to be in decompensated heart failure with a low BNP. He is lower extremity edema is likely related to chronic venous insufficiency. He takes Lasix as needed. I advised him to try to take it for several days in a row however he is hesitant due to recent incontinence after a TURP procedure. He may have some underlying pulmonary disease with pursed lip breathing. I did advise pulmonary function tests and will contact him with those results.  Follow-up in 3-4 months.  Pixie Casino, MD, [facility] Attending Cardiologist [facility]   11/01/2016 3:23 PM

## 2016-11-03 ENCOUNTER — Ambulatory Visit: Payer: Medicare Other | Admitting: Hematology

## 2016-11-03 ENCOUNTER — Other Ambulatory Visit: Payer: Medicare Other

## 2016-11-04 ENCOUNTER — Encounter (HOSPITAL_COMMUNITY): Payer: Self-pay | Admitting: Emergency Medicine

## 2016-11-04 ENCOUNTER — Emergency Department (HOSPITAL_COMMUNITY)
Admission: EM | Admit: 2016-11-04 | Discharge: 2016-11-04 | Disposition: A | Discharge status: Home / Self Care | Payer: Medicare Other | Attending: Emergency Medicine | Admitting: Emergency Medicine

## 2016-11-04 DIAGNOSIS — S80211A Abrasion, right knee, initial encounter: Secondary | ICD-10-CM | POA: Insufficient documentation

## 2016-11-04 DIAGNOSIS — S51812A Laceration without foreign body of left forearm, initial encounter: Secondary | ICD-10-CM | POA: Insufficient documentation

## 2016-11-04 DIAGNOSIS — E039 Hypothyroidism, unspecified: Secondary | ICD-10-CM | POA: Insufficient documentation

## 2016-11-04 DIAGNOSIS — S8990XA Unspecified injury of unspecified lower leg, initial encounter: Secondary | ICD-10-CM | POA: Diagnosis not present

## 2016-11-04 DIAGNOSIS — N189 Chronic kidney disease, unspecified: Secondary | ICD-10-CM | POA: Insufficient documentation

## 2016-11-04 DIAGNOSIS — I130 Hypertensive heart and chronic kidney disease with heart failure and stage 1 through stage 4 chronic kidney disease, or unspecified chronic kidney disease: Secondary | ICD-10-CM | POA: Insufficient documentation

## 2016-11-04 DIAGNOSIS — Y939 Activity, unspecified: Secondary | ICD-10-CM | POA: Insufficient documentation

## 2016-11-04 DIAGNOSIS — T148XXA Other injury of unspecified body region, initial encounter: Secondary | ICD-10-CM

## 2016-11-04 DIAGNOSIS — W07XXXA Fall from chair, initial encounter: Secondary | ICD-10-CM | POA: Insufficient documentation

## 2016-11-04 DIAGNOSIS — Y929 Unspecified place or not applicable: Secondary | ICD-10-CM | POA: Diagnosis not present

## 2016-11-04 DIAGNOSIS — S61412A Laceration without foreign body of left hand, initial encounter: Secondary | ICD-10-CM | POA: Diagnosis not present

## 2016-11-04 DIAGNOSIS — Y999 Unspecified external cause status: Secondary | ICD-10-CM | POA: Diagnosis not present

## 2016-11-04 DIAGNOSIS — I5032 Chronic diastolic (congestive) heart failure: Secondary | ICD-10-CM | POA: Diagnosis not present

## 2016-11-04 DIAGNOSIS — S59912A Unspecified injury of left forearm, initial encounter: Secondary | ICD-10-CM | POA: Diagnosis present

## 2016-11-04 DIAGNOSIS — W19XXXA Unspecified fall, initial encounter: Secondary | ICD-10-CM

## 2016-11-04 DIAGNOSIS — Z87891 Personal history of nicotine dependence: Secondary | ICD-10-CM | POA: Diagnosis not present

## 2016-11-04 DIAGNOSIS — S81811A Laceration without foreign body, right lower leg, initial encounter: Secondary | ICD-10-CM | POA: Diagnosis not present

## 2016-11-04 MED ORDER — ONDANSETRON 4 MG PO TBDP: 4.0000 mg | ORAL_TABLET | Freq: Once | ORAL | Status: DC

## 2016-11-04 NOTE — ED Provider Notes (Signed)
[place] DEPT Provider Note   CSN: [id number] Arrival date & time: 11/04/16 2052     History    Chief Complaint  Patient presents with  . Fall     HPI Robert Rowe is a 82 y.o. male. 82yo M w/ PMH below who p/w multiple abrasions after a fall. His prior to arrival, the patient had reached the top of his stairs with a motorized lift and bend forward. He lost his balance and fell down the stairs. He denies any head injury or loss of consciousness. He reports mild pain at the areas of his abrasions including left forearm and hand, right lower leg. He denies any joint pain or pain moving extremities. No neck or back pain, chest or abdominal pain, headache, or visual changes. He is up-to-date on tetanus.  Past Medical History:  Diagnosis Date  . BENIGN PROSTATIC HYPERTROPHY 04/05/2007  . 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 CHEST DISCOMFORT 04/25/2009  . Chronic diastolic CHF (congestive heart failure) ([place])    a. Echo 12/16: mild LVH, EF 55-60%, no RWMA, Gr 1 DD, mild AI, MAC, mild LAE, normal RVF, PASP 37 mmHg  . Chronic kidney disease    chronic   . DJD (degenerative joint disease)   . Dysrhythmia    pvc's   . GERD 12/03/2007  . Hiatal hernia   . History of nuclear stress test    Myoview 1/17: EF 53%, normal perfusion; Low Risk  . HYPERLIPIDEMIA 04/05/2007  . Hypothyroidism   . Osteoarth NOS-Unspec 04/05/2007  . Palpitations 02/12/2010  . PEDAL EDEMA 05/01/2008  . Rheumatoid arthritis(714.0) 04/05/2007  . Shortness of breath dyspnea   . TRANSIENT ISCHEMIC ATTACK, HX OF 04/05/2007   ?      Patient Active Problem List   Diagnosis Date Noted  . Chronic diastolic CHF (congestive heart failure) ([place]) 11/01/2016  . Essential hypertension 11/01/2016  . Chest pain 11/15/2015  . Pain in the chest   . Intracranial vascular stenosis 11/09/2015  . Mild cognitive impairment 11/09/2015  . 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 Benign prostatic  hyperplasia with urinary obstruction 03/19/2015  . Awareness alteration, transient 02/26/2015  . PVC's (premature ventricular contractions) 08/22/2013  . Hypothyroidism 05/09/2013  . Stricture and stenosis of esophagus 03/12/2013  . Dysphagia, unspecified(787.20) 02/22/2013  . DOE (dyspnea on exertion) 03/23/2011  . PALPITATIONS 02/12/2010  . CHEST DISCOMFORT 04/25/2009  . PEDAL EDEMA 05/01/2008  . GERD 12/03/2007  . Dyslipidemia 04/05/2007  . BENIGN PROSTATIC HYPERTROPHY 04/05/2007  . Rheumatoid arthritis ([place]) 04/05/2007  . Osteoarthrosis, unspecified whether generalized or localized, unspecified site 04/05/2007  . History of cardiovascular disorder 04/05/2007    Past Surgical History:  Procedure Laterality Date  . BALLOON DILATION N/A 03/12/2013   Procedure: BALLOON DILATION;  Surgeon: Inda Castle, MD;  Location: [facility];  Service: Endoscopy;  Laterality: N/A;  . CATARACT EXTRACTION  2001   bilateral  . CYSTOSCOPY WITH URETHRAL DILATATION N/A 09/24/2015   Procedure: CYSTOSCOPY WITH COOK BALLOON DILATATION OF URETHRAL STRICTURE ;  Surgeon: Carolan Clines, MD;  Location: [facility];  Service: Urology;  Laterality: N/A;  . CYSTOSCOPY WITH URETHRAL DILATATION N/A 07/20/2016   Procedure: CYSTOSCOPY WITH URETHRAL DILATATION;  Surgeon: Alexis Frock, MD;  Location: [facility];  Service: Urology;  Laterality: N/A;  1 HOUR  . ESOPHAGOGASTRODUODENOSCOPY N/A 03/12/2013   Procedure: ESOPHAGOGASTRODUODENOSCOPY (EGD);  Surgeon: Inda Castle, MD;  Location: [facility];  Service: Endoscopy;  Laterality: N/A;  .  HEMORRHOID SURGERY    . HERNIA REPAIR     inguinal x5  . KNEE SURGERY  arthroscopic   left  . PROSTATE CRYOABLATION     2000  . SHOULDER SURGERY  few years ago   left  . TRANSURETHRAL RESECTION OF PROSTATE N/A 03/19/2015   Procedure: TRANSURETHRAL RESECTION OF THE PROSTATE (TURP);  Surgeon: Carolan Clines, MD;  Location: [facility];  Service: Urology;  Laterality: N/A;         Home Medications    Prior to Admission medications   Medication Sig Start Date End Date Taking? Authorizing Provider  clopidogrel (PLAVIX) 75 MG tablet Take 1 tablet (75 mg total) by mouth daily. 10/31/16  Yes Marletta Lor, MD  furosemide (LASIX) 20 MG tablet Take 20 mg by mouth daily as needed for fluid or edema.  08/18/16  Yes Historical Provider, MD  hydrocortisone 2.5 % cream Apply to affected area daily. 04/11/16  Yes Historical Provider, MD  levothyroxine (SYNTHROID, LEVOTHROID) 50 MCG tablet TAKE 1 TABLET (50 MCG TOTAL) BY MOUTH DAILY. 09/13/16  Yes Marletta Lor, MD  metoprolol succinate (TOPROL-XL) 25 MG 24 hr tablet Take 1 tablet (25 mg total) by mouth at bedtime. 05/06/16  Yes Pixie Casino, MD  Multiple Vitamin (MULTIVITAMIN WITH MINERALS) TABS tablet Take 1 tablet by mouth 3 (three) times a week.   Yes Historical Provider, MD  neomycin-polymyxin-dexamethasone (MAXITROL) 0.1 % ophthalmic suspension Place 2 drops into both eyes 4 (four) times daily. 09/09/16  Yes Marletta Lor, MD  nitroGLYCERIN (NITROSTAT) 0.4 MG SL tablet Place 0.4 mg under the tongue every 5 (five) minutes as needed (ESOPHEAGEAL SPASPMS). Reported on 03/01/2016   Yes Historical Provider, MD  pantoprazole (PROTONIX) 40 MG tablet Take 1 tablet (40 mg total) by mouth daily. 03/02/16  Yes Marletta Lor, MD  predniSONE (DELTASONE) 5 MG tablet Take 5 mg by mouth daily. 10/31/16  Yes Historical Provider, MD  rivastigmine (EXELON) 1.5 MG capsule Take 1 capsule (1.5 mg total) by mouth 2 (two) times daily. 08/16/16  Yes Cameron Sprang, MD      Family History  Problem Relation Age of Onset  . Breast cancer Mother   . Heart disease Father   . Heart attack Father   . Lymphoma Brother   . Stroke Neg Hx   . Hypertension Neg Hx      Social History  Substance Use Topics  . Smoking status: Former Smoker    Types: Cigarettes    Quit date: 08/13/1953  . Smokeless tobacco: Never Used  .  Alcohol use Yes     Comment: occ vodka     Allergies     Sulfonamide derivatives    Review of Systems  10 Systems reviewed and are negative for acute change except as noted in the HPI.   Physical Exam Updated Vital Signs BP 155/94   Pulse 97   Temp 98.2 F (36.8 C) (Oral)   Resp 16   Ht 5\' 2"  (1.575 m)   Wt 150 lb (68 kg)   SpO2 96%   BMI 27.44 kg/m   Physical Exam  Constitutional: He is oriented to person, place, and time. He appears well-developed and well-nourished. No distress.  HENT:  Head: Normocephalic and atraumatic.  Moist mucous membranes  Eyes: Conjunctivae are normal. Pupils are equal, round, and reactive to light.  Neck: Normal range of motion. Neck supple.  Cardiovascular: Normal rate, regular rhythm and normal heart sounds.   No murmur  heard. Pulmonary/Chest: Effort normal and breath sounds normal. He exhibits no tenderness.  Abdominal: Soft. Bowel sounds are normal. He exhibits no distension. There is no tenderness.  Musculoskeletal: Normal range of motion. He exhibits no edema.  Normal ROM b/l knees, ankles, elbows, wrists; normal grip strength; scattered ecchymoses and mild swelling L hand, no focal joint tenderness  Neurological: He is alert and oriented to person, place, and time.  Fluent speech  Skin: Skin is warm and dry.  2 small skin tears dorsal L hand; 3cm skin tear dorsal L forearm; abrasion R knee; extensive 10+ cm skin tear R anterior lower leg over shin w/ adipose tissue exposure, no active bleeding  Psychiatric: He has a normal mood and affect. Judgment normal.  Nursing note and vitals reviewed.     ED Treatments / Results  Labs (all labs ordered are listed, but only abnormal results are displayed) Labs Reviewed - No data to display   EKG  EKG Interpretation  Date/Time:    Ventricular Rate:    PR Interval:    QRS Duration:   QT Interval:    QTC Calculation:   R Axis:     Text Interpretation:            Radiology No results found.  Procedures Procedures (including critical care time) Procedures  Medications Ordered in ED  Medications  ondansetron (ZOFRAN-ODT) disintegrating tablet 4 mg (4 mg Oral Not Given 11/04/16 2223)     Initial Impression / Assessment and Plan / ED Course  I have reviewed the triage vital signs and the nursing notes.    Clinical Course     Pt w/ multiple skin tears after mechanical fall, well appearing, A&O x 4 on exam. No major complaints. Tetanus UTD. No focal joint swelling or pain. No head injury. Cleaned wounds and bandaged with bacitracin. R lower leg wound is extensive but given tissue loss, there is unfortunately no way to repair with sutures. Place steristrips on wounds on L hand and forearm. Explained that it would have to heal secondarily and instructed family on wound care. Instructed to f/u with PCP next week for re-check of wounds. He shouldn't and family voiced understanding of wound care and return precautions and patient discharged in satisfactory condition.  Final Clinical Impressions(s) / ED Diagnoses   Final diagnoses:  Multiple skin tears  Fall, initial encounter     Discharge Medication List as of 11/04/2016 11:18 PM         [name], MD 11/05/16 0006

## 2016-11-04 NOTE — ED Triage Notes (Signed)
Pt in EMS after mechanical fall, was getting out of chair and fell back onto wife, denies hitting head. Pt has avulsion to RLE. Pt takes plavix. VSS

## 2016-11-07 ENCOUNTER — Telehealth: Payer: Self-pay | Admitting: Internal Medicine

## 2016-11-07 NOTE — Telephone Encounter (Signed)
Schedule is full. Would you like to add him to you schedule?

## 2016-11-07 NOTE — Telephone Encounter (Signed)
Pt has been sch

## 2016-11-07 NOTE — Telephone Encounter (Signed)
Okay to add

## 2016-11-07 NOTE — Telephone Encounter (Signed)
Noted  

## 2016-11-07 NOTE — Telephone Encounter (Signed)
° ° °  Pt son call and said pt took another fall and he need to come in tomorrow to have his dressing change. He don't want to see nobody but Dr Raliegh Ip and would like to come in tomorrow 11/08/16

## 2016-11-08 ENCOUNTER — Ambulatory Visit (HOSPITAL_COMMUNITY)
Admission: RE | Admit: 2016-11-08 | Discharge: 2016-11-08 | Disposition: A | Discharge status: Home / Self Care | Payer: Medicare Other | Source: Ambulatory Visit | Attending: Internal Medicine | Admitting: Internal Medicine

## 2016-11-08 ENCOUNTER — Encounter: Payer: Self-pay | Admitting: Internal Medicine

## 2016-11-08 ENCOUNTER — Ambulatory Visit (INDEPENDENT_AMBULATORY_CARE_PROVIDER_SITE_OTHER): Payer: Medicare Other | Admitting: Internal Medicine

## 2016-11-08 VITALS — BP 148/68 | HR 96 | Temp 97.7°F | Ht 62.0 in | Wt 150.2 lb

## 2016-11-08 DIAGNOSIS — R942 Abnormal results of pulmonary function studies: Secondary | ICD-10-CM | POA: Insufficient documentation

## 2016-11-08 DIAGNOSIS — Z87891 Personal history of nicotine dependence: Secondary | ICD-10-CM | POA: Diagnosis not present

## 2016-11-08 DIAGNOSIS — R0600 Dyspnea, unspecified: Secondary | ICD-10-CM

## 2016-11-08 DIAGNOSIS — S81801A Unspecified open wound, right lower leg, initial encounter: Secondary | ICD-10-CM

## 2016-11-08 DIAGNOSIS — R0609 Other forms of dyspnea: Secondary | ICD-10-CM | POA: Diagnosis not present

## 2016-11-08 LAB — PULMONARY FUNCTION TEST
DL/VA % pred: 73 %
DL/VA: 2.89 ml/min/mmHg/L
DLCO unc % pred: 44 %
DLCO unc: 9.7 ml/min/mmHg
FEF 25-75 Post: 0.74 L/sec
FEF 25-75 Pre: 0.75 L/sec
FEF2575-%Change-Post: -1 %
FEF2575-%Pred-Post: 161 %
FEF2575-%Pred-Pre: 163 %
FEV1-%Change-Post: 0 %
FEV1-%Pred-Post: 103 %
FEV1-%Pred-Pre: 103 %
FEV1-Post: 1.25 L
FEV1-Pre: 1.26 L
FEV1FVC-%Change-Post: 2 %
FEV1FVC-%Pred-Pre: 102 %
FEV6-%Change-Post: -4 %
FEV6-%Pred-Post: 98 %
FEV6-%Pred-Pre: 103 %
FEV6-Post: 1.73 L
FEV6-Pre: 1.81 L
FEV6FVC-%Change-Post: -1 %
FEV6FVC-%Pred-Post: 113 %
FEV6FVC-%Pred-Pre: 114 %
FVC-%Change-Post: -3 %
FVC-%Pred-Post: 87 %
FVC-%Pred-Pre: 89 %
FVC-Post: 1.75 L
FVC-Pre: 1.81 L
Post FEV1/FVC ratio: 71 %
Post FEV6/FVC ratio: 99 %
Pre FEV1/FVC ratio: 69 %
Pre FEV6/FVC Ratio: 100 %
RV % pred: 93 %
RV: 2.39 L
TLC % pred: 81 %
TLC: 4.5 L

## 2016-11-08 MED ORDER — ALBUTEROL SULFATE (2.5 MG/3ML) 0.083% IN NEBU
2.5000 mg | INHALATION_SOLUTION | Freq: Once | RESPIRATORY_TRACT | Status: AC
  Administered 2016-11-08: 2.5 mg via RESPIRATORY_TRACT

## 2016-11-08 NOTE — Progress Notes (Signed)
Subjective:    Patient ID: Robert Rowe, male    DOB: 05-22-1918, 82 y.o.   MRN: [id number]  HPI 82 year old patient seen today following a recent ED visit. The patient fell down stairs, sustaining multiple skin tears as well as a deep skin avulsion injury to the right anterior lower leg. He was evaluated and treated in the ED on December 15. He is a bit sore and ambulating more slowly  ED records reviewed  Past Medical History:  Diagnosis Date  . BENIGN PROSTATIC HYPERTROPHY 04/05/2007  . 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 CHEST DISCOMFORT 04/25/2009  . Chronic diastolic CHF (congestive heart failure) ([place])    a. Echo 12/16: mild LVH, EF 55-60%, no RWMA, Gr 1 DD, mild AI, MAC, mild LAE, normal RVF, PASP 37 mmHg  . Chronic kidney disease    chronic   . DJD (degenerative joint disease)   . Dysrhythmia    pvc's   . GERD 12/03/2007  . Hiatal hernia   . History of nuclear stress test    Myoview 1/17: EF 53%, normal perfusion; Low Risk  . HYPERLIPIDEMIA 04/05/2007  . Hypothyroidism   . Osteoarth NOS-Unspec 04/05/2007  . Palpitations 02/12/2010  . PEDAL EDEMA 05/01/2008  . Rheumatoid arthritis(714.0) 04/05/2007  . Shortness of breath dyspnea   . TRANSIENT ISCHEMIC ATTACK, HX OF 04/05/2007   ?      Social History   Social History  . Marital status: Married    Spouse name: N/A  . Number of children: N/A  . Years of education: N/A   Occupational History  . Not on file.   Social History Main Topics  . Smoking status: Former Smoker    Types: Cigarettes    Quit date: 08/13/1953  . Smokeless tobacco: Never Used  . Alcohol use Yes     Comment: occ vodka  . Drug use: No  . Sexual activity: Not on file   Other Topics Concern  . Not on file   Social History Narrative  . No narrative on file    Past Surgical History:  Procedure Laterality Date  . BALLOON DILATION N/A 03/12/2013   Procedure: BALLOON DILATION;  Surgeon: Inda Castle, MD;  Location:  [facility];  Service: Endoscopy;  Laterality: N/A;  . CATARACT EXTRACTION  2001   bilateral  . CYSTOSCOPY WITH URETHRAL DILATATION N/A 09/24/2015   Procedure: CYSTOSCOPY WITH COOK BALLOON DILATATION OF URETHRAL STRICTURE ;  Surgeon: Carolan Clines, MD;  Location: [facility];  Service: Urology;  Laterality: N/A;  . CYSTOSCOPY WITH URETHRAL DILATATION N/A 07/20/2016   Procedure: CYSTOSCOPY WITH URETHRAL DILATATION;  Surgeon: Alexis Frock, MD;  Location: [facility];  Service: Urology;  Laterality: N/A;  1 HOUR  . ESOPHAGOGASTRODUODENOSCOPY N/A 03/12/2013   Procedure: ESOPHAGOGASTRODUODENOSCOPY (EGD);  Surgeon: Inda Castle, MD;  Location: [facility];  Service: Endoscopy;  Laterality: N/A;  . HEMORRHOID SURGERY    . HERNIA REPAIR     inguinal x5  . KNEE SURGERY  arthroscopic   left  . PROSTATE CRYOABLATION     2000  . SHOULDER SURGERY  few years ago   left  . TRANSURETHRAL RESECTION OF PROSTATE N/A 03/19/2015   Procedure: TRANSURETHRAL RESECTION OF THE PROSTATE (TURP);  Surgeon: Carolan Clines, MD;  Location: [facility];  Service: Urology;  Laterality: N/A;    Family History  Problem Relation Age of Onset  . Breast cancer Mother   . Heart disease Father   .  Heart attack Father   . Lymphoma Brother   . Stroke Neg Hx   . Hypertension Neg Hx     Allergies  Allergen Reactions  . Sulfonamide Derivatives Other (See Comments)    Patient does not remember allergy symptoms    Current Outpatient Prescriptions on File Prior to Visit  Medication Sig Dispense Refill  . clopidogrel (PLAVIX) 75 MG tablet Take 1 tablet (75 mg total) by mouth daily. 90 tablet 1  . furosemide (LASIX) 20 MG tablet Take 20 mg by mouth daily as needed for fluid or edema.   10  . hydrocortisone 2.5 % cream Apply to affected area daily.  0  . levothyroxine (SYNTHROID, LEVOTHROID) 50 MCG tablet TAKE 1 TABLET (50 MCG TOTAL) BY MOUTH DAILY. 90 tablet 3  . metoprolol succinate (TOPROL-XL) 25 MG 24 hr tablet Take 1  tablet (25 mg total) by mouth at bedtime. 90 tablet 3  . Multiple Vitamin (MULTIVITAMIN WITH MINERALS) TABS tablet Take 1 tablet by mouth 3 (three) times a week.    . neomycin-polymyxin-dexamethasone (MAXITROL) 0.1 % ophthalmic suspension Place 2 drops into both eyes 4 (four) times daily. 5 mL 3  . nitroGLYCERIN (NITROSTAT) 0.4 MG SL tablet Place 0.4 mg under the tongue every 5 (five) minutes as needed (ESOPHEAGEAL SPASPMS). Reported on 03/01/2016    . pantoprazole (PROTONIX) 40 MG tablet Take 1 tablet (40 mg total) by mouth daily. 90 tablet 3  . predniSONE (DELTASONE) 5 MG tablet Take 5 mg by mouth daily.    . rivastigmine (EXELON) 1.5 MG capsule Take 1 capsule (1.5 mg total) by mouth 2 (two) times daily. 60 capsule 6   No current facility-administered medications on file prior to visit.     BP (!) 148/68 (BP Location: Right Arm, Patient Position: Sitting, Cuff Size: Normal)   Pulse 96   Temp 97.7 F (36.5 C) (Oral)   Ht 5\' 2"  (1.575 m)   Wt 150 lb 3.2 oz (68.1 kg)   SpO2 96%   BMI 27.47 kg/m      Review of Systems  Constitutional: Positive for activity change and fatigue.  Musculoskeletal: Positive for arthralgias, myalgias and neck stiffness.  Skin: Positive for wound.  Neurological: Positive for weakness.       Objective:   Physical Exam  Constitutional: He appears well-developed and well-nourished. No distress.  Able to transfer to the examining table with minimal assistance  Vital signs stable  Skin:  Small skin tears noted involving the left hand and left forearm area with Steri-Strips in place  Large deep avulsion injury involving the right anterior lower leg.  Wound was clean and appeared quite fresh, and tender          Assessment & Plan:   Deep.  Skin avulsion injury right anterior lower leg.  Wound was cleaned, and dressed with Telfa gauze and bandage.  Wound care discussed The patient will return next week for dressing change.  Likely will need some  gentle debridement   Robert Rowe

## 2016-11-08 NOTE — Progress Notes (Signed)
Pre visit review using our clinic review tool, if applicable. No additional management support is needed unless otherwise documented below in the visit note. 

## 2016-11-08 NOTE — Patient Instructions (Addendum)
Deep Skin Avulsion Introduction A deep skin avulsion is a type of open wound. It often results from a severe injury (trauma) that tears away all layers of the skin or an entire body part. The areas of the body that are most often affected by a deep skin avulsion include the face, lips, ears, nose, and fingers. A deep skin avulsion may make structures below the skin become visible. You may be able to see muscle, bone, nerves, and blood vessels. A deep skin avulsion can also damage important structures beneath the skin. These include tendons, ligaments, nerves, or blood vessels. What are the causes? Injuries that often cause a deep skin avulsion include:  Being crushed.  Falling against a jagged surface.  Animal bites.  Gunshot wounds.  Severe burns.  Injuries that involve being dragged, such as bicycle or motorcycle accidents. What are the signs or symptoms? Symptoms of a deep skin avulsion include:  Pain.  Numbness.  Swelling.  A misshapen body part.  Bleeding, which may be heavy.  Fluid leaking from the wound. How is this diagnosed? This condition may be diagnosed with a medical history and physical exam. You may also have X-rays done. How is this treated? The treatment that is chosen for a deep skin avulsion depends on how large and deep the wound is and where it is located. Treatment for all types of avulsions usually starts with:  Controlling the bleeding.  Washing out the wound with a germ-free (sterile) salt-water solution.  Removing dead tissue from the wound. A wound may be closed or left open to heal. This depends on the size and location of the wound and whether it is likely to become infected. Wounds are usually covered or closed if they expose blood vessels, nerves, bone, or cartilage.  Wounds that are small and clean may be closed with stitches (sutures).  Wounds that cannot be closed with sutures may be covered with a piece of skin (graft) or a skin flap.  Skin may be taken from on or near the wound, from another part of the body, or from a donor.  Wounds may be left open if they are hard to close or they may become infected. These wounds heal over time from the bottom up. You may also receive medicine. This may include:  Antibiotics.  A tetanus shot.  Rabies vaccine. Follow these instructions at home: Medicines  Take or apply over-the-counter and prescription medicines only as told by your health care provider.  If you were prescribed an antibiotic, take or apply it as told by your health care provider. Do not stop taking the antibiotic even if your condition improves.  You may get anti-itch medicine while your wound is healing. Use it only as told by your health care provider. Wound care  There are many ways to close and cover a wound. For example, a wound can be covered with sutures, skin glue, or adhesive strips. Follow instructions from your health care provider about:  How to take care of your wound.  When and how you should change your bandage (dressing).  When you should remove your dressing.  Removing whatever was used to close your wound.  Keep the dressing dry as told by your health care provider. Do not take baths, swim, use a hot tub, or do anything that would put your wound underwater until your health care provider approves.  Clean the wound each day or as told by your health care provider.  Wash the wound with mild  soap and water.  Rinse the wound with water to remove all soap.  Pat the wound dry with a clean towel. Do not rub it.  Do not scratch or pick at the wound.  Check your wound every day for signs of infection. Watch for:  Redness, swelling, or pain.  Fluid, blood, or pus. General instructions  Raise (elevate) the injured area above the level of your heart while you are sitting or lying down.  Keep all follow-up visits as told by your health care provider. This is important. Contact a health  care provider if:  You received a tetanus shot and you have swelling, severe pain, redness, or bleeding at the injection site.  You have a fever.  Your pain is not controlled with medicine.  You have increased redness, swelling, or pain at the site of your wound.  You have fluid, blood, or pus coming from your wound.  You notice a bad smell coming from your wound or your dressing.  A wound that was closed breaks open.  You notice something coming out of the wound, such as wood or glass.  You notice a change in the color of your skin near your wound.  You develop a new rash.  You need to change the dressing frequently due to fluid, blood, or pus draining from the wound. Get help right away if:  Your pain suddenly increases and is severe.  You develop severe swelling around the wound.  You develop numbness around the wound.  You have nausea and vomiting that does not go away after 24 hours.  You feel light-headed, weak, or faint.  You develop chest pain.  You have trouble breathing.  Your wound is on your hand or foot and you cannot properly move a finger or toe.  The wound is on your hand or foot and you notice that your fingers or toes look pale or bluish.  You have a red streak going away from your wound. This information is not intended to replace advice given to you by your health care provider. Make sure you discuss any questions you have with your health care provider. Document Released: 01/03/2007 Document Revised: 04/14/2016 Document Reviewed: 11/12/2014  2017 Elsevier  Return in 7 days for follow-up.  Leave dressing in place until follow-up visit.  Call earlier if you develop any fever, increasing pain or drainage.

## 2016-11-09 ENCOUNTER — Other Ambulatory Visit: Payer: Self-pay | Admitting: *Deleted

## 2016-11-09 DIAGNOSIS — R0609 Other forms of dyspnea: Secondary | ICD-10-CM

## 2016-11-09 DIAGNOSIS — R942 Abnormal results of pulmonary function studies: Secondary | ICD-10-CM

## 2016-11-09 DIAGNOSIS — R0600 Dyspnea, unspecified: Secondary | ICD-10-CM

## 2016-11-10 ENCOUNTER — Encounter: Payer: Self-pay | Admitting: *Deleted

## 2016-11-10 ENCOUNTER — Telehealth: Payer: Self-pay | Admitting: Internal Medicine

## 2016-11-10 NOTE — Telephone Encounter (Signed)
Returned call to patient. Explained need for referral to pulmonologist. MyChart message sent to patient with this practice office number. He is aware a referral has been ordered.

## 2016-11-10 NOTE — Telephone Encounter (Signed)
Please call,he is confused about the appt he is supposed to have with the pulmonary doctor.

## 2016-11-15 ENCOUNTER — Encounter: Payer: Self-pay | Admitting: Family Medicine

## 2016-11-15 ENCOUNTER — Ambulatory Visit (INDEPENDENT_AMBULATORY_CARE_PROVIDER_SITE_OTHER): Payer: Medicare Other | Admitting: Family Medicine

## 2016-11-15 VITALS — BP 110/78 | HR 89 | Ht 62.0 in | Wt 151.9 lb

## 2016-11-15 DIAGNOSIS — S51012D Laceration without foreign body of left elbow, subsequent encounter: Secondary | ICD-10-CM

## 2016-11-15 DIAGNOSIS — L03114 Cellulitis of left upper limb: Secondary | ICD-10-CM | POA: Diagnosis not present

## 2016-11-15 DIAGNOSIS — S81811D Laceration without foreign body, right lower leg, subsequent encounter: Secondary | ICD-10-CM | POA: Diagnosis not present

## 2016-11-15 MED ORDER — CEPHALEXIN 500 MG PO CAPS: 500.0000 mg | ORAL_CAPSULE | Freq: Three times a day (TID) | ORAL | 0 refills | Status: DC

## 2016-11-15 NOTE — Progress Notes (Signed)
Subjective:     Patient ID: Robert Rowe, male   DOB: 1918-04-10, 82 y.o.   MRN: [id number]  HPI Patient seen regarding follow-up several wounds incurred following a fall down some stairs. He had very large avulsion laceration of the right lower leg as well as lacerations dorsum left hand and left elbow. Initially went to ER. He has been changing dressings daily on his leg and elbow.  Has noticed increased swelling and redness of the left hand over the past 24 hours. Denies any fevers or chills. Is ambulating without much difficulty. They have a friend of the family assisting with dressing changes.  Past Medical History:  Diagnosis Date  . BENIGN PROSTATIC HYPERTROPHY 04/05/2007  . 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 CHEST DISCOMFORT 04/25/2009  . Chronic diastolic CHF (congestive heart failure) ([place])    a. Echo 12/16: mild LVH, EF 55-60%, no RWMA, Gr 1 DD, mild AI, MAC, mild LAE, normal RVF, PASP 37 mmHg  . Chronic kidney disease    chronic   . DJD (degenerative joint disease)   . Dysrhythmia    pvc's   . GERD 12/03/2007  . Hiatal hernia   . History of nuclear stress test    Myoview 1/17: EF 53%, normal perfusion; Low Risk  . HYPERLIPIDEMIA 04/05/2007  . Hypothyroidism   . Osteoarth NOS-Unspec 04/05/2007  . Palpitations 02/12/2010  . PEDAL EDEMA 05/01/2008  . Rheumatoid arthritis(714.0) 04/05/2007  . Shortness of breath dyspnea   . TRANSIENT ISCHEMIC ATTACK, HX OF 04/05/2007   ?    Past Surgical History:  Procedure Laterality Date  . BALLOON DILATION N/A 03/12/2013   Procedure: BALLOON DILATION;  Surgeon: Inda Castle, MD;  Location: [facility];  Service: Endoscopy;  Laterality: N/A;  . CATARACT EXTRACTION  2001   bilateral  . CYSTOSCOPY WITH URETHRAL DILATATION N/A 09/24/2015   Procedure: CYSTOSCOPY WITH COOK BALLOON DILATATION OF URETHRAL STRICTURE ;  Surgeon: Carolan Clines, MD;  Location: [facility];  Service: Urology;  Laterality: N/A;  . CYSTOSCOPY  WITH URETHRAL DILATATION N/A 07/20/2016   Procedure: CYSTOSCOPY WITH URETHRAL DILATATION;  Surgeon: Alexis Frock, MD;  Location: [facility];  Service: Urology;  Laterality: N/A;  1 HOUR  . ESOPHAGOGASTRODUODENOSCOPY N/A 03/12/2013   Procedure: ESOPHAGOGASTRODUODENOSCOPY (EGD);  Surgeon: Inda Castle, MD;  Location: [facility];  Service: Endoscopy;  Laterality: N/A;  . HEMORRHOID SURGERY    . HERNIA REPAIR     inguinal x5  . KNEE SURGERY  arthroscopic   left  . PROSTATE CRYOABLATION     2000  . SHOULDER SURGERY  few years ago   left  . TRANSURETHRAL RESECTION OF PROSTATE N/A 03/19/2015   Procedure: TRANSURETHRAL RESECTION OF THE PROSTATE (TURP);  Surgeon: Carolan Clines, MD;  Location: [facility];  Service: Urology;  Laterality: N/A;    reports that he quit smoking about 63 years ago. His smoking use included Cigarettes. He has never used smokeless tobacco. He reports that he drinks alcohol. He reports that he does not use drugs. family history includes Breast cancer in his mother; Heart attack in his father; Heart disease in his father; Lymphoma in his brother. Allergies  Allergen Reactions  . Sulfonamide Derivatives Other (See Comments)    Patient does not remember allergy symptoms    Review of Systems  Constitutional: Negative for chills and fever.  Respiratory: Negative for shortness of breath.   Cardiovascular: Positive for leg swelling. Negative for chest pain.  Gastrointestinal:  Negative for nausea and vomiting.  Skin: Positive for wound.  Neurological: Negative for weakness and headaches.  Psychiatric/Behavioral: Negative for confusion.       Objective:   Physical Exam  Constitutional: He appears well-developed and well-nourished.  Cardiovascular: Normal rate.   Pulmonary/Chest: Effort normal and breath sounds normal. No respiratory distress. He has no wheezes. He has no rales.  Skin:  Large fairly deep avulsion injury right lower lateral leg which is approximately 11  cm in length and 4 cm in width. Good granulation tissue. No surrounding cellulitis changes.  He has swelling dorsum of the left hand with some mild to moderate erythema and minimal tenderness. Wound is healing well  Laceration left elbow appears to be healing well. No surrounding erythema.       Assessment:     #1 multiple lacerations including dorsum left hand, left elbow, and right lateral leg with deep avulsion laceration right leg #2 probable early cellulitis changes left hand    Plan:     -Start Keflex 500 mg 3 times a day for 10 days -Continue to elevate leg and hand frequently -Follow-up immediately for any fevers or chills or progressive erythema or swelling -Follow-up with primary in one week to reassess -Continue daily dressing changes  [name] MD [facility]

## 2016-11-15 NOTE — Progress Notes (Signed)
Pre visit review using our clinic review tool, if applicable. No additional management support is needed unless otherwise documented below in the visit note. 

## 2016-11-15 NOTE — Patient Instructions (Signed)
Elevate leg frequently Follow up for any fever or chillls Go ahead and start the antibiotic. Follow up in one week.

## 2016-11-22 ENCOUNTER — Ambulatory Visit (INDEPENDENT_AMBULATORY_CARE_PROVIDER_SITE_OTHER): Payer: Medicare Other | Admitting: Internal Medicine

## 2016-11-22 ENCOUNTER — Encounter: Payer: Self-pay | Admitting: Internal Medicine

## 2016-11-22 VITALS — BP 148/80 | HR 76 | Temp 97.4°F | Wt 153.0 lb

## 2016-11-22 DIAGNOSIS — L03114 Cellulitis of left upper limb: Secondary | ICD-10-CM | POA: Diagnosis not present

## 2016-11-22 DIAGNOSIS — I10 Essential (primary) hypertension: Secondary | ICD-10-CM | POA: Diagnosis not present

## 2016-11-22 NOTE — Progress Notes (Signed)
Pre visit review using our clinic review tool, if applicable. No additional management support is needed unless otherwise documented below in the visit note. 

## 2016-11-22 NOTE — Patient Instructions (Signed)
Return in one week for follow-up  Continue local wound care  Complete antibiotic therapy  Report any worsening such as increasing pain, redness or drainage or fever

## 2016-11-22 NOTE — Progress Notes (Signed)
Subjective:    Patient ID: Robert Rowe, male    DOB: 1918/05/26, 83 y.o.   MRN: [id number]  HPI 83 year old patient who is seen today in follow-up.  He was seen 1 week ago for follow-up of a large avulsion injury involving his right lower leg.  He is also placed on antibiotic therapy for stabilize involving the left hand secondary to a traumatic wound that occurred at the same time.  He continues to improve He continues daily.  Cleansings with normal saline.  Wound continues to improve  Past Medical History:  Diagnosis Date  . BENIGN PROSTATIC HYPERTROPHY 04/05/2007  . 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 CHEST DISCOMFORT 04/25/2009  . Chronic diastolic CHF (congestive heart failure) ([place])    a. Echo 12/16: mild LVH, EF 55-60%, no RWMA, Gr 1 DD, mild AI, MAC, mild LAE, normal RVF, PASP 37 mmHg  . Chronic kidney disease    chronic   . DJD (degenerative joint disease)   . Dysrhythmia    pvc's   . GERD 12/03/2007  . Hiatal hernia   . History of nuclear stress test    Myoview 1/17: EF 53%, normal perfusion; Low Risk  . HYPERLIPIDEMIA 04/05/2007  . Hypothyroidism   . Osteoarth NOS-Unspec 04/05/2007  . Palpitations 02/12/2010  . PEDAL EDEMA 05/01/2008  . Rheumatoid arthritis(714.0) 04/05/2007  . Shortness of breath dyspnea   . TRANSIENT ISCHEMIC ATTACK, HX OF 04/05/2007   ?      Social History   Social History  . Marital status: Married    Spouse name: N/A  . Number of children: N/A  . Years of education: N/A   Occupational History  . Not on file.   Social History Main Topics  . Smoking status: Former Smoker    Types: Cigarettes    Quit date: 08/13/1953  . Smokeless tobacco: Never Used  . Alcohol use Yes     Comment: occ vodka  . Drug use: No  . Sexual activity: Not on file   Other Topics Concern  . Not on file   Social History Narrative  . No narrative on file    Past Surgical History:  Procedure Laterality Date  . BALLOON DILATION N/A  03/12/2013   Procedure: BALLOON DILATION;  Surgeon: Inda Castle, MD;  Location: [facility];  Service: Endoscopy;  Laterality: N/A;  . CATARACT EXTRACTION  2001   bilateral  . CYSTOSCOPY WITH URETHRAL DILATATION N/A 09/24/2015   Procedure: CYSTOSCOPY WITH COOK BALLOON DILATATION OF URETHRAL STRICTURE ;  Surgeon: Carolan Clines, MD;  Location: [facility];  Service: Urology;  Laterality: N/A;  . CYSTOSCOPY WITH URETHRAL DILATATION N/A 07/20/2016   Procedure: CYSTOSCOPY WITH URETHRAL DILATATION;  Surgeon: Alexis Frock, MD;  Location: [facility];  Service: Urology;  Laterality: N/A;  1 HOUR  . ESOPHAGOGASTRODUODENOSCOPY N/A 03/12/2013   Procedure: ESOPHAGOGASTRODUODENOSCOPY (EGD);  Surgeon: Inda Castle, MD;  Location: [facility];  Service: Endoscopy;  Laterality: N/A;  . HEMORRHOID SURGERY    . HERNIA REPAIR     inguinal x5  . KNEE SURGERY  arthroscopic   left  . PROSTATE CRYOABLATION     2000  . SHOULDER SURGERY  few years ago   left  . TRANSURETHRAL RESECTION OF PROSTATE N/A 03/19/2015   Procedure: TRANSURETHRAL RESECTION OF THE PROSTATE (TURP);  Surgeon: Carolan Clines, MD;  Location: [facility];  Service: Urology;  Laterality: N/A;    Family History  Problem Relation Age of  Onset  . Breast cancer Mother   . Heart disease Father   . Heart attack Father   . Lymphoma Brother   . Stroke Neg Hx   . Hypertension Neg Hx     Allergies  Allergen Reactions  . Sulfonamide Derivatives Other (See Comments)    Patient does not remember allergy symptoms    Current Outpatient Prescriptions on File Prior to Visit  Medication Sig Dispense Refill  . cephALEXin (KEFLEX) 500 MG capsule Take 1 capsule (500 mg total) by mouth 3 (three) times daily. 30 capsule 0  . clopidogrel (PLAVIX) 75 MG tablet Take 1 tablet (75 mg total) by mouth daily. 90 tablet 1  . furosemide (LASIX) 20 MG tablet Take 20 mg by mouth daily as needed for fluid or edema.   10  . hydrocortisone 2.5 % cream Apply to  affected area daily.  0  . levothyroxine (SYNTHROID, LEVOTHROID) 50 MCG tablet TAKE 1 TABLET (50 MCG TOTAL) BY MOUTH DAILY. 90 tablet 3  . metoprolol succinate (TOPROL-XL) 25 MG 24 hr tablet Take 1 tablet (25 mg total) by mouth at bedtime. 90 tablet 3  . Multiple Vitamin (MULTIVITAMIN WITH MINERALS) TABS tablet Take 1 tablet by mouth 3 (three) times a week.    . neomycin-polymyxin-dexamethasone (MAXITROL) 0.1 % ophthalmic suspension Place 2 drops into both eyes 4 (four) times daily. 5 mL 3  . nitroGLYCERIN (NITROSTAT) 0.4 MG SL tablet Place 0.4 mg under the tongue every 5 (five) minutes as needed (ESOPHEAGEAL SPASPMS). Reported on 03/01/2016    . pantoprazole (PROTONIX) 40 MG tablet Take 1 tablet (40 mg total) by mouth daily. 90 tablet 3  . predniSONE (DELTASONE) 5 MG tablet Take 5 mg by mouth daily.    . rivastigmine (EXELON) 1.5 MG capsule Take 1 capsule (1.5 mg total) by mouth 2 (two) times daily. 60 capsule 6   No current facility-administered medications on file prior to visit.     BP (!) 148/80 (BP Location: Right Arm, Patient Position: Sitting, Cuff Size: Normal)   Pulse 76   Temp 97.4 F (36.3 C) (Oral)   Wt 153 lb (69.4 kg)   SpO2 91%   BMI 27.98 kg/m      Review of Systems  Skin: Positive for rash and wound.       Objective:   Physical Exam  Constitutional: He appears well-developed and well-nourished. No distress.  Skin:  The wound involving the left elbow has larger resolved The wounds involving his left hand are improving nicely.  He has only minimal erythema and no drainage  The wound involving his right lower leg, also to continues to improve.  It appears very clean without exudate;  clearly less deep and contracting          Assessment & Plan:   Cellulitis, left hand, improving.  Will complete antibiotic therapy Avulsion wound, right lower leg continues to improve.  Will continue daily cleansing and dressing changes.  Recheck 7 days  Will call.  There  is any clinical worsening  Nyoka Cowden

## 2016-11-23 ENCOUNTER — Other Ambulatory Visit: Payer: Self-pay | Admitting: Internal Medicine

## 2016-11-28 DIAGNOSIS — N35010 Post-traumatic urethral stricture, male, meatal: Secondary | ICD-10-CM | POA: Diagnosis not present

## 2016-11-29 ENCOUNTER — Encounter: Payer: Self-pay | Admitting: Internal Medicine

## 2016-11-29 ENCOUNTER — Ambulatory Visit (INDEPENDENT_AMBULATORY_CARE_PROVIDER_SITE_OTHER): Payer: Medicare Other | Admitting: Internal Medicine

## 2016-11-29 VITALS — BP 122/74 | HR 74 | Temp 97.6°F | Ht 62.0 in | Wt 153.2 lb

## 2016-11-29 DIAGNOSIS — S81801S Unspecified open wound, right lower leg, sequela: Secondary | ICD-10-CM

## 2016-11-29 MED ORDER — SILVER SULFADIAZINE 1 % EX CREA: 1.0000 "application " | TOPICAL_CREAM | Freq: Every day | CUTANEOUS | 0 refills | Status: DC

## 2016-11-29 NOTE — Patient Instructions (Signed)
Wound care clinic consult as discussed  Clean wound gently with soap and water daily, apply Silvadene cream and wrapped  Further recommendations through the wound care clinic

## 2016-11-29 NOTE — Progress Notes (Signed)
Subjective:    Patient ID: Robert Rowe, male    DOB: 1918-10-09, 83 y.o.   MRN: [id number]  HPI 83 year old patient who is seen today for follow-up of a deep avulsion injury to his right anterior lower leg.  This initially occurred on December 15. He has been gently cleansing the wound daily, applying antibiotic ointment and dressing.  The wound remains tender, but no increasing pain. No fever or constitutional complaints  Past Medical History:  Diagnosis Date  . BENIGN PROSTATIC HYPERTROPHY 04/05/2007  . 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 CHEST DISCOMFORT 04/25/2009  . Chronic diastolic CHF (congestive heart failure) ([place])    a. Echo 12/16: mild LVH, EF 55-60%, no RWMA, Gr 1 DD, mild AI, MAC, mild LAE, normal RVF, PASP 37 mmHg  . Chronic kidney disease    chronic   . DJD (degenerative joint disease)   . Dysrhythmia    pvc's   . GERD 12/03/2007  . Hiatal hernia   . History of nuclear stress test    Myoview 1/17: EF 53%, normal perfusion; Low Risk  . HYPERLIPIDEMIA 04/05/2007  . Hypothyroidism   . Osteoarth NOS-Unspec 04/05/2007  . Palpitations 02/12/2010  . PEDAL EDEMA 05/01/2008  . Rheumatoid arthritis(714.0) 04/05/2007  . Shortness of breath dyspnea   . TRANSIENT ISCHEMIC ATTACK, HX OF 04/05/2007   ?      Social History   Social History  . Marital status: Married    Spouse name: N/A  . Number of children: N/A  . Years of education: N/A   Occupational History  . Not on file.   Social History Main Topics  . Smoking status: Former Smoker    Types: Cigarettes    Quit date: 08/13/1953  . Smokeless tobacco: Never Used  . Alcohol use Yes     Comment: occ vodka  . Drug use: No  . Sexual activity: Not on file   Other Topics Concern  . Not on file   Social History Narrative  . No narrative on file    Past Surgical History:  Procedure Laterality Date  . BALLOON DILATION N/A 03/12/2013   Procedure: BALLOON DILATION;  Surgeon: Inda Castle,  MD;  Location: [facility];  Service: Endoscopy;  Laterality: N/A;  . CATARACT EXTRACTION  2001   bilateral  . CYSTOSCOPY WITH URETHRAL DILATATION N/A 09/24/2015   Procedure: CYSTOSCOPY WITH COOK BALLOON DILATATION OF URETHRAL STRICTURE ;  Surgeon: Carolan Clines, MD;  Location: [facility];  Service: Urology;  Laterality: N/A;  . CYSTOSCOPY WITH URETHRAL DILATATION N/A 07/20/2016   Procedure: CYSTOSCOPY WITH URETHRAL DILATATION;  Surgeon: Alexis Frock, MD;  Location: [facility];  Service: Urology;  Laterality: N/A;  1 HOUR  . ESOPHAGOGASTRODUODENOSCOPY N/A 03/12/2013   Procedure: ESOPHAGOGASTRODUODENOSCOPY (EGD);  Surgeon: Inda Castle, MD;  Location: [facility];  Service: Endoscopy;  Laterality: N/A;  . HEMORRHOID SURGERY    . HERNIA REPAIR     inguinal x5  . KNEE SURGERY  arthroscopic   left  . PROSTATE CRYOABLATION     2000  . SHOULDER SURGERY  few years ago   left  . TRANSURETHRAL RESECTION OF PROSTATE N/A 03/19/2015   Procedure: TRANSURETHRAL RESECTION OF THE PROSTATE (TURP);  Surgeon: Carolan Clines, MD;  Location: [facility];  Service: Urology;  Laterality: N/A;    Family History  Problem Relation Age of Onset  . Breast cancer Mother   . Heart disease Father   . Heart attack  Father   . Lymphoma Brother   . Stroke Neg Hx   . Hypertension Neg Hx     Allergies  Allergen Reactions  . Sulfonamide Derivatives Other (See Comments)    Patient does not remember allergy symptoms    Current Outpatient Prescriptions on File Prior to Visit  Medication Sig Dispense Refill  . clopidogrel (PLAVIX) 75 MG tablet Take 1 tablet (75 mg total) by mouth daily. 90 tablet 1  . furosemide (LASIX) 20 MG tablet Take 20 mg by mouth daily as needed for fluid or edema.   10  . hydrocortisone 2.5 % cream Apply to affected area daily.  0  . levothyroxine (SYNTHROID, LEVOTHROID) 50 MCG tablet TAKE 1 TABLET (50 MCG TOTAL) BY MOUTH DAILY. 90 tablet 3  . metoprolol succinate (TOPROL-XL) 25 MG 24 hr  tablet Take 1 tablet (25 mg total) by mouth at bedtime. 90 tablet 3  . Multiple Vitamin (MULTIVITAMIN WITH MINERALS) TABS tablet Take 1 tablet by mouth 3 (three) times a week.    . nitroGLYCERIN (NITROSTAT) 0.4 MG SL tablet Place 0.4 mg under the tongue every 5 (five) minutes as needed (ESOPHEAGEAL SPASPMS). Reported on 03/01/2016    . pantoprazole (PROTONIX) 40 MG tablet Take 1 tablet (40 mg total) by mouth daily. 90 tablet 1  . rivastigmine (EXELON) 1.5 MG capsule Take 1 capsule (1.5 mg total) by mouth 2 (two) times daily. 60 capsule 6   No current facility-administered medications on file prior to visit.     BP 122/74 (BP Location: Right Arm, Patient Position: Sitting, Cuff Size: Normal)   Pulse 74   Temp 97.6 F (36.4 C) (Oral)   Ht _0  (1.575 m)   Wt 153 lb 3.2 oz (69.5 kg)   SpO2 (!) 84%   BMI 28.02 kg/m      Review of Systems  Constitutional: Negative for appetite change, chills, fatigue and fever.  HENT: Negative for congestion, dental problem, ear pain, hearing loss, sore throat, tinnitus, trouble swallowing and voice change.   Eyes: Negative for pain, discharge and visual disturbance.  Respiratory: Negative for cough, chest tightness, wheezing and stridor.   Cardiovascular: Negative for chest pain, palpitations and leg swelling.  Gastrointestinal: Negative for abdominal distention, abdominal pain, blood in stool, constipation, diarrhea, nausea and vomiting.  Genitourinary: Negative for difficulty urinating, discharge, flank pain, genital sores, hematuria and urgency.  Musculoskeletal: Negative for arthralgias, back pain, gait problem, joint swelling, myalgias and neck stiffness.  Skin: Positive for wound. Negative for rash.  Neurological: Negative for dizziness, syncope, speech difficulty, weakness, numbness and headaches.  Hematological: Negative for adenopathy. Does not bruise/bleed easily.  Psychiatric/Behavioral: Negative for behavioral problems and dysphoric mood.  The patient is not nervous/anxious.        Objective:   Physical Exam  Constitutional: He appears well-developed and well-nourished. No distress.  Skin:  Large open wound involving his right anterior lower leg The wound appears fresh, but no real improvement over the past week.  Very little in the way of new granulation tissue No exudate Did appear to be a surrounding rim of erythema involving the wound circumference.          Assessment & Plan:   Traumatic open wound right anterior lower leg.  Will continue daily cleansing and gentle debridement.  Patient was told to discontinue antibiotic ointment.  Will treat with daily application of Silvadene.  Will set up follow-up with wound care clinic  [facility]

## 2016-11-29 NOTE — Progress Notes (Signed)
Pre visit review using our clinic review tool, if applicable. No additional management support is needed unless otherwise documented below in the visit note. 

## 2016-12-01 DIAGNOSIS — M150 Primary generalized (osteo)arthritis: Secondary | ICD-10-CM | POA: Diagnosis not present

## 2016-12-01 DIAGNOSIS — N19 Unspecified kidney failure: Secondary | ICD-10-CM | POA: Diagnosis not present

## 2016-12-01 DIAGNOSIS — M0609 Rheumatoid arthritis without rheumatoid factor, multiple sites: Secondary | ICD-10-CM | POA: Diagnosis not present

## 2016-12-02 ENCOUNTER — Encounter (HOSPITAL_BASED_OUTPATIENT_CLINIC_OR_DEPARTMENT_OTHER): Payer: Medicare Other | Attending: Internal Medicine

## 2016-12-02 DIAGNOSIS — M069 Rheumatoid arthritis, unspecified: Secondary | ICD-10-CM | POA: Insufficient documentation

## 2016-12-02 DIAGNOSIS — I110 Hypertensive heart disease with heart failure: Secondary | ICD-10-CM | POA: Diagnosis not present

## 2016-12-02 DIAGNOSIS — I87321 Chronic venous hypertension (idiopathic) with inflammation of right lower extremity: Secondary | ICD-10-CM | POA: Insufficient documentation

## 2016-12-02 DIAGNOSIS — W109XXD Fall (on) (from) unspecified stairs and steps, subsequent encounter: Secondary | ICD-10-CM | POA: Insufficient documentation

## 2016-12-02 DIAGNOSIS — S81811A Laceration without foreign body, right lower leg, initial encounter: Secondary | ICD-10-CM | POA: Diagnosis not present

## 2016-12-02 DIAGNOSIS — S81811D Laceration without foreign body, right lower leg, subsequent encounter: Secondary | ICD-10-CM | POA: Diagnosis not present

## 2016-12-02 DIAGNOSIS — I509 Heart failure, unspecified: Secondary | ICD-10-CM | POA: Diagnosis not present

## 2016-12-09 DIAGNOSIS — M069 Rheumatoid arthritis, unspecified: Secondary | ICD-10-CM | POA: Diagnosis not present

## 2016-12-09 DIAGNOSIS — I509 Heart failure, unspecified: Secondary | ICD-10-CM | POA: Diagnosis not present

## 2016-12-09 DIAGNOSIS — I110 Hypertensive heart disease with heart failure: Secondary | ICD-10-CM | POA: Diagnosis not present

## 2016-12-09 DIAGNOSIS — I87321 Chronic venous hypertension (idiopathic) with inflammation of right lower extremity: Secondary | ICD-10-CM | POA: Diagnosis not present

## 2016-12-09 DIAGNOSIS — S81811D Laceration without foreign body, right lower leg, subsequent encounter: Secondary | ICD-10-CM | POA: Diagnosis not present

## 2016-12-15 DIAGNOSIS — I110 Hypertensive heart disease with heart failure: Secondary | ICD-10-CM | POA: Diagnosis not present

## 2016-12-15 DIAGNOSIS — M069 Rheumatoid arthritis, unspecified: Secondary | ICD-10-CM | POA: Diagnosis not present

## 2016-12-15 DIAGNOSIS — S81811D Laceration without foreign body, right lower leg, subsequent encounter: Secondary | ICD-10-CM | POA: Diagnosis not present

## 2016-12-15 DIAGNOSIS — I509 Heart failure, unspecified: Secondary | ICD-10-CM | POA: Diagnosis not present

## 2016-12-15 DIAGNOSIS — I87321 Chronic venous hypertension (idiopathic) with inflammation of right lower extremity: Secondary | ICD-10-CM | POA: Diagnosis not present

## 2016-12-19 ENCOUNTER — Encounter (HOSPITAL_BASED_OUTPATIENT_CLINIC_OR_DEPARTMENT_OTHER): Payer: Medicare Other

## 2016-12-22 ENCOUNTER — Encounter (HOSPITAL_BASED_OUTPATIENT_CLINIC_OR_DEPARTMENT_OTHER): Payer: Medicare Other | Attending: Internal Medicine

## 2016-12-22 DIAGNOSIS — I10 Essential (primary) hypertension: Secondary | ICD-10-CM | POA: Diagnosis not present

## 2016-12-22 DIAGNOSIS — W109XXA Fall (on) (from) unspecified stairs and steps, initial encounter: Secondary | ICD-10-CM | POA: Diagnosis not present

## 2016-12-22 DIAGNOSIS — I87321 Chronic venous hypertension (idiopathic) with inflammation of right lower extremity: Secondary | ICD-10-CM | POA: Diagnosis not present

## 2016-12-22 DIAGNOSIS — S81811A Laceration without foreign body, right lower leg, initial encounter: Secondary | ICD-10-CM | POA: Insufficient documentation

## 2016-12-29 DIAGNOSIS — S81811A Laceration without foreign body, right lower leg, initial encounter: Secondary | ICD-10-CM | POA: Diagnosis not present

## 2016-12-29 DIAGNOSIS — I87321 Chronic venous hypertension (idiopathic) with inflammation of right lower extremity: Secondary | ICD-10-CM | POA: Diagnosis not present

## 2016-12-29 DIAGNOSIS — I10 Essential (primary) hypertension: Secondary | ICD-10-CM | POA: Diagnosis not present

## 2017-01-06 DIAGNOSIS — I872 Venous insufficiency (chronic) (peripheral): Secondary | ICD-10-CM | POA: Diagnosis not present

## 2017-01-06 DIAGNOSIS — S81811A Laceration without foreign body, right lower leg, initial encounter: Secondary | ICD-10-CM | POA: Diagnosis not present

## 2017-01-06 DIAGNOSIS — I87321 Chronic venous hypertension (idiopathic) with inflammation of right lower extremity: Secondary | ICD-10-CM | POA: Diagnosis not present

## 2017-01-06 DIAGNOSIS — I10 Essential (primary) hypertension: Secondary | ICD-10-CM | POA: Diagnosis not present

## 2017-01-10 ENCOUNTER — Other Ambulatory Visit: Payer: Self-pay | Admitting: Dermatology

## 2017-01-10 DIAGNOSIS — C44729 Squamous cell carcinoma of skin of left lower limb, including hip: Secondary | ICD-10-CM | POA: Diagnosis not present

## 2017-01-10 DIAGNOSIS — D235 Other benign neoplasm of skin of trunk: Secondary | ICD-10-CM | POA: Diagnosis not present

## 2017-01-10 DIAGNOSIS — L821 Other seborrheic keratosis: Secondary | ICD-10-CM | POA: Diagnosis not present

## 2017-01-10 DIAGNOSIS — D485 Neoplasm of uncertain behavior of skin: Secondary | ICD-10-CM | POA: Diagnosis not present

## 2017-01-10 DIAGNOSIS — C4362 Malignant melanoma of left upper limb, including shoulder: Secondary | ICD-10-CM | POA: Diagnosis not present

## 2017-01-10 DIAGNOSIS — L570 Actinic keratosis: Secondary | ICD-10-CM | POA: Diagnosis not present

## 2017-01-10 DIAGNOSIS — D1801 Hemangioma of skin and subcutaneous tissue: Secondary | ICD-10-CM | POA: Diagnosis not present

## 2017-01-10 DIAGNOSIS — L814 Other melanin hyperpigmentation: Secondary | ICD-10-CM | POA: Diagnosis not present

## 2017-01-13 DIAGNOSIS — I87321 Chronic venous hypertension (idiopathic) with inflammation of right lower extremity: Secondary | ICD-10-CM | POA: Diagnosis not present

## 2017-01-13 DIAGNOSIS — I10 Essential (primary) hypertension: Secondary | ICD-10-CM | POA: Diagnosis not present

## 2017-01-13 DIAGNOSIS — S81811A Laceration without foreign body, right lower leg, initial encounter: Secondary | ICD-10-CM | POA: Diagnosis not present

## 2017-01-17 ENCOUNTER — Other Ambulatory Visit: Payer: Self-pay | Admitting: Dermatology

## 2017-01-17 DIAGNOSIS — C4362 Malignant melanoma of left upper limb, including shoulder: Secondary | ICD-10-CM | POA: Diagnosis not present

## 2017-01-17 DIAGNOSIS — L905 Scar conditions and fibrosis of skin: Secondary | ICD-10-CM | POA: Diagnosis not present

## 2017-01-17 DIAGNOSIS — D0362 Melanoma in situ of left upper limb, including shoulder: Secondary | ICD-10-CM | POA: Diagnosis not present

## 2017-01-17 DIAGNOSIS — Z85828 Personal history of other malignant neoplasm of skin: Secondary | ICD-10-CM | POA: Diagnosis not present

## 2017-01-20 ENCOUNTER — Encounter (HOSPITAL_BASED_OUTPATIENT_CLINIC_OR_DEPARTMENT_OTHER): Payer: Medicare Other | Attending: Internal Medicine

## 2017-01-20 DIAGNOSIS — S81811A Laceration without foreign body, right lower leg, initial encounter: Secondary | ICD-10-CM | POA: Diagnosis not present

## 2017-01-20 DIAGNOSIS — I10 Essential (primary) hypertension: Secondary | ICD-10-CM | POA: Diagnosis not present

## 2017-01-20 DIAGNOSIS — I87321 Chronic venous hypertension (idiopathic) with inflammation of right lower extremity: Secondary | ICD-10-CM | POA: Insufficient documentation

## 2017-01-20 DIAGNOSIS — W109XXA Fall (on) (from) unspecified stairs and steps, initial encounter: Secondary | ICD-10-CM | POA: Diagnosis not present

## 2017-01-27 DIAGNOSIS — I10 Essential (primary) hypertension: Secondary | ICD-10-CM | POA: Diagnosis not present

## 2017-01-27 DIAGNOSIS — I87321 Chronic venous hypertension (idiopathic) with inflammation of right lower extremity: Secondary | ICD-10-CM | POA: Diagnosis not present

## 2017-01-27 DIAGNOSIS — S81811A Laceration without foreign body, right lower leg, initial encounter: Secondary | ICD-10-CM | POA: Diagnosis not present

## 2017-02-03 DIAGNOSIS — S81811A Laceration without foreign body, right lower leg, initial encounter: Secondary | ICD-10-CM | POA: Diagnosis not present

## 2017-02-03 DIAGNOSIS — I10 Essential (primary) hypertension: Secondary | ICD-10-CM | POA: Diagnosis not present

## 2017-02-03 DIAGNOSIS — I87321 Chronic venous hypertension (idiopathic) with inflammation of right lower extremity: Secondary | ICD-10-CM | POA: Diagnosis not present

## 2017-02-09 DIAGNOSIS — S81811A Laceration without foreign body, right lower leg, initial encounter: Secondary | ICD-10-CM | POA: Diagnosis not present

## 2017-02-09 DIAGNOSIS — I10 Essential (primary) hypertension: Secondary | ICD-10-CM | POA: Diagnosis not present

## 2017-02-09 DIAGNOSIS — I87321 Chronic venous hypertension (idiopathic) with inflammation of right lower extremity: Secondary | ICD-10-CM | POA: Diagnosis not present

## 2017-02-14 DIAGNOSIS — L905 Scar conditions and fibrosis of skin: Secondary | ICD-10-CM | POA: Diagnosis not present

## 2017-02-14 DIAGNOSIS — Z85828 Personal history of other malignant neoplasm of skin: Secondary | ICD-10-CM | POA: Diagnosis not present

## 2017-02-16 DIAGNOSIS — I10 Essential (primary) hypertension: Secondary | ICD-10-CM | POA: Diagnosis not present

## 2017-02-16 DIAGNOSIS — I87321 Chronic venous hypertension (idiopathic) with inflammation of right lower extremity: Secondary | ICD-10-CM | POA: Diagnosis not present

## 2017-02-16 DIAGNOSIS — S81811A Laceration without foreign body, right lower leg, initial encounter: Secondary | ICD-10-CM | POA: Diagnosis not present

## 2017-02-21 ENCOUNTER — Ambulatory Visit: Payer: Medicare Other | Admitting: Neurology

## 2017-02-24 ENCOUNTER — Encounter (HOSPITAL_BASED_OUTPATIENT_CLINIC_OR_DEPARTMENT_OTHER): Payer: Medicare Other | Attending: Internal Medicine

## 2017-02-24 DIAGNOSIS — M1990 Unspecified osteoarthritis, unspecified site: Secondary | ICD-10-CM | POA: Diagnosis not present

## 2017-02-24 DIAGNOSIS — I110 Hypertensive heart disease with heart failure: Secondary | ICD-10-CM | POA: Diagnosis not present

## 2017-02-24 DIAGNOSIS — X58XXXD Exposure to other specified factors, subsequent encounter: Secondary | ICD-10-CM | POA: Diagnosis not present

## 2017-02-24 DIAGNOSIS — M069 Rheumatoid arthritis, unspecified: Secondary | ICD-10-CM | POA: Diagnosis not present

## 2017-02-24 DIAGNOSIS — L97812 Non-pressure chronic ulcer of other part of right lower leg with fat layer exposed: Secondary | ICD-10-CM | POA: Diagnosis not present

## 2017-02-24 DIAGNOSIS — I509 Heart failure, unspecified: Secondary | ICD-10-CM | POA: Insufficient documentation

## 2017-02-24 DIAGNOSIS — S81811D Laceration without foreign body, right lower leg, subsequent encounter: Secondary | ICD-10-CM | POA: Insufficient documentation

## 2017-02-24 DIAGNOSIS — I87321 Chronic venous hypertension (idiopathic) with inflammation of right lower extremity: Secondary | ICD-10-CM | POA: Insufficient documentation

## 2017-03-02 DIAGNOSIS — I509 Heart failure, unspecified: Secondary | ICD-10-CM | POA: Diagnosis not present

## 2017-03-02 DIAGNOSIS — I110 Hypertensive heart disease with heart failure: Secondary | ICD-10-CM | POA: Diagnosis not present

## 2017-03-02 DIAGNOSIS — M069 Rheumatoid arthritis, unspecified: Secondary | ICD-10-CM | POA: Diagnosis not present

## 2017-03-02 DIAGNOSIS — I87321 Chronic venous hypertension (idiopathic) with inflammation of right lower extremity: Secondary | ICD-10-CM | POA: Diagnosis not present

## 2017-03-02 DIAGNOSIS — M1990 Unspecified osteoarthritis, unspecified site: Secondary | ICD-10-CM | POA: Diagnosis not present

## 2017-03-02 DIAGNOSIS — S81811D Laceration without foreign body, right lower leg, subsequent encounter: Secondary | ICD-10-CM | POA: Diagnosis not present

## 2017-03-09 DIAGNOSIS — M069 Rheumatoid arthritis, unspecified: Secondary | ICD-10-CM | POA: Diagnosis not present

## 2017-03-09 DIAGNOSIS — I87321 Chronic venous hypertension (idiopathic) with inflammation of right lower extremity: Secondary | ICD-10-CM | POA: Diagnosis not present

## 2017-03-09 DIAGNOSIS — M1990 Unspecified osteoarthritis, unspecified site: Secondary | ICD-10-CM | POA: Diagnosis not present

## 2017-03-09 DIAGNOSIS — I509 Heart failure, unspecified: Secondary | ICD-10-CM | POA: Diagnosis not present

## 2017-03-09 DIAGNOSIS — I110 Hypertensive heart disease with heart failure: Secondary | ICD-10-CM | POA: Diagnosis not present

## 2017-03-09 DIAGNOSIS — L97819 Non-pressure chronic ulcer of other part of right lower leg with unspecified severity: Secondary | ICD-10-CM | POA: Diagnosis not present

## 2017-03-09 DIAGNOSIS — S81811D Laceration without foreign body, right lower leg, subsequent encounter: Secondary | ICD-10-CM | POA: Diagnosis not present

## 2017-03-14 DIAGNOSIS — L820 Inflamed seborrheic keratosis: Secondary | ICD-10-CM | POA: Diagnosis not present

## 2017-03-14 DIAGNOSIS — L929 Granulomatous disorder of the skin and subcutaneous tissue, unspecified: Secondary | ICD-10-CM | POA: Diagnosis not present

## 2017-03-17 DIAGNOSIS — M069 Rheumatoid arthritis, unspecified: Secondary | ICD-10-CM | POA: Diagnosis not present

## 2017-03-17 DIAGNOSIS — I87321 Chronic venous hypertension (idiopathic) with inflammation of right lower extremity: Secondary | ICD-10-CM | POA: Diagnosis not present

## 2017-03-17 DIAGNOSIS — M1990 Unspecified osteoarthritis, unspecified site: Secondary | ICD-10-CM | POA: Diagnosis not present

## 2017-03-17 DIAGNOSIS — I110 Hypertensive heart disease with heart failure: Secondary | ICD-10-CM | POA: Diagnosis not present

## 2017-03-17 DIAGNOSIS — I509 Heart failure, unspecified: Secondary | ICD-10-CM | POA: Diagnosis not present

## 2017-03-17 DIAGNOSIS — S81811D Laceration without foreign body, right lower leg, subsequent encounter: Secondary | ICD-10-CM | POA: Diagnosis not present

## 2017-03-17 DIAGNOSIS — L97812 Non-pressure chronic ulcer of other part of right lower leg with fat layer exposed: Secondary | ICD-10-CM | POA: Diagnosis not present

## 2017-03-24 ENCOUNTER — Encounter (HOSPITAL_BASED_OUTPATIENT_CLINIC_OR_DEPARTMENT_OTHER): Payer: Medicare Other | Attending: Internal Medicine

## 2017-03-24 DIAGNOSIS — M069 Rheumatoid arthritis, unspecified: Secondary | ICD-10-CM | POA: Diagnosis not present

## 2017-03-24 DIAGNOSIS — X58XXXD Exposure to other specified factors, subsequent encounter: Secondary | ICD-10-CM | POA: Insufficient documentation

## 2017-03-24 DIAGNOSIS — I509 Heart failure, unspecified: Secondary | ICD-10-CM | POA: Insufficient documentation

## 2017-03-24 DIAGNOSIS — I87321 Chronic venous hypertension (idiopathic) with inflammation of right lower extremity: Secondary | ICD-10-CM | POA: Insufficient documentation

## 2017-03-24 DIAGNOSIS — R29898 Other symptoms and signs involving the musculoskeletal system: Secondary | ICD-10-CM

## 2017-03-24 DIAGNOSIS — M1990 Unspecified osteoarthritis, unspecified site: Secondary | ICD-10-CM | POA: Diagnosis not present

## 2017-03-24 DIAGNOSIS — S81811D Laceration without foreign body, right lower leg, subsequent encounter: Secondary | ICD-10-CM | POA: Diagnosis not present

## 2017-03-24 DIAGNOSIS — I110 Hypertensive heart disease with heart failure: Secondary | ICD-10-CM | POA: Diagnosis not present

## 2017-03-24 DIAGNOSIS — S81811A Laceration without foreign body, right lower leg, initial encounter: Secondary | ICD-10-CM | POA: Diagnosis not present

## 2017-03-25 ENCOUNTER — Other Ambulatory Visit: Payer: Self-pay | Admitting: Family Medicine

## 2017-03-31 DIAGNOSIS — I87321 Chronic venous hypertension (idiopathic) with inflammation of right lower extremity: Secondary | ICD-10-CM | POA: Diagnosis not present

## 2017-03-31 DIAGNOSIS — M069 Rheumatoid arthritis, unspecified: Secondary | ICD-10-CM | POA: Diagnosis not present

## 2017-03-31 DIAGNOSIS — S81811D Laceration without foreign body, right lower leg, subsequent encounter: Secondary | ICD-10-CM | POA: Diagnosis not present

## 2017-03-31 DIAGNOSIS — M1990 Unspecified osteoarthritis, unspecified site: Secondary | ICD-10-CM | POA: Diagnosis not present

## 2017-03-31 DIAGNOSIS — I110 Hypertensive heart disease with heart failure: Secondary | ICD-10-CM | POA: Diagnosis not present

## 2017-03-31 DIAGNOSIS — I509 Heart failure, unspecified: Secondary | ICD-10-CM | POA: Diagnosis not present

## 2017-03-31 DIAGNOSIS — L97819 Non-pressure chronic ulcer of other part of right lower leg with unspecified severity: Secondary | ICD-10-CM | POA: Diagnosis not present

## 2017-04-03 ENCOUNTER — Other Ambulatory Visit: Payer: Self-pay | Admitting: Family Medicine

## 2017-04-03 NOTE — Telephone Encounter (Signed)
Patient last OV was 11/29/2016 and last labs are 07/12/16 please Advised if [facility] for the refill.

## 2017-04-03 NOTE — Telephone Encounter (Signed)
Okay to refill? 

## 2017-04-11 DIAGNOSIS — N35010 Post-traumatic urethral stricture, male, meatal: Secondary | ICD-10-CM | POA: Diagnosis not present

## 2017-04-11 DIAGNOSIS — R3912 Poor urinary stream: Secondary | ICD-10-CM | POA: Diagnosis not present

## 2017-04-11 DIAGNOSIS — N3281 Overactive bladder: Secondary | ICD-10-CM | POA: Diagnosis not present

## 2017-04-11 DIAGNOSIS — N401 Enlarged prostate with lower urinary tract symptoms: Secondary | ICD-10-CM | POA: Diagnosis not present

## 2017-04-20 ENCOUNTER — Telehealth: Payer: Self-pay

## 2017-04-20 NOTE — Telephone Encounter (Signed)
Patient is calling regarding his ongoing leg/hip pain. He would like a call back as soon as you come into the office. Please call him on his cell phone.  Dr. Raliegh Ip - PLEASE CALL PT ASAP. Thanks!

## 2017-04-24 DIAGNOSIS — M7061 Trochanteric bursitis, right hip: Secondary | ICD-10-CM | POA: Diagnosis not present

## 2017-04-24 DIAGNOSIS — M545 Low back pain: Secondary | ICD-10-CM | POA: Diagnosis not present

## 2017-05-04 ENCOUNTER — Other Ambulatory Visit: Payer: Self-pay | Admitting: Family Medicine

## 2017-05-09 ENCOUNTER — Other Ambulatory Visit: Payer: Self-pay | Admitting: Internal Medicine

## 2017-06-02 ENCOUNTER — Other Ambulatory Visit: Payer: Self-pay | Admitting: Internal Medicine

## 2017-06-02 DIAGNOSIS — R29898 Other symptoms and signs involving the musculoskeletal system: Secondary | ICD-10-CM

## 2017-06-07 ENCOUNTER — Other Ambulatory Visit: Payer: Self-pay | Admitting: Internal Medicine

## 2017-06-07 NOTE — Telephone Encounter (Signed)
Rx has been sent to the pharmacy electronically. ° °

## 2017-06-08 ENCOUNTER — Ambulatory Visit: Payer: Medicare Other | Attending: Internal Medicine | Admitting: Physical Therapy

## 2017-06-08 DIAGNOSIS — R2681 Unsteadiness on feet: Secondary | ICD-10-CM | POA: Diagnosis not present

## 2017-06-08 DIAGNOSIS — R2689 Other abnormalities of gait and mobility: Secondary | ICD-10-CM | POA: Diagnosis not present

## 2017-06-08 DIAGNOSIS — M25651 Stiffness of right hip, not elsewhere classified: Secondary | ICD-10-CM

## 2017-06-08 DIAGNOSIS — M6281 Muscle weakness (generalized): Secondary | ICD-10-CM

## 2017-06-08 NOTE — Therapy (Signed)
[facility] [address] [place], [place], [address] Phone: [phone]   Fax:  [phone]  Physical Therapy Evaluation  Patient Details  Name: Robert Rowe MRN: [id number] Date of Birth: [birth year] Referring Provider: Burnice Logan  Encounter Date: 06/08/2017      PT End of Session - 06/08/17 2202    Visit Number 1   Number of Visits 16   Date for PT Re-Evaluation 07/08/17   Authorization Type [employer] Medicare-GCODE every 10th visit   PT Start Time 0938   PT Stop Time 1025   PT Time Calculation (min) 47 min   Activity Tolerance Patient tolerated treatment well   Behavior During Therapy [facility] for tasks assessed/performed      Past Medical History:  Diagnosis Date  . BENIGN PROSTATIC HYPERTROPHY 04/05/2007  . 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 CHEST DISCOMFORT 04/25/2009  . Chronic diastolic CHF (congestive heart failure) ([place])    a. Echo 12/16: mild LVH, EF 55-60%, no RWMA, Gr 1 DD, mild AI, MAC, mild LAE, normal RVF, PASP 37 mmHg  . Chronic kidney disease    chronic   . DJD (degenerative joint disease)   . Dysrhythmia    pvc's   . GERD 12/03/2007  . Hiatal hernia   . History of nuclear stress test    Myoview 1/17: EF 53%, normal perfusion; Low Risk  . HYPERLIPIDEMIA 04/05/2007  . Hypothyroidism   . Osteoarth NOS-Unspec 04/05/2007  . Palpitations 02/12/2010  . PEDAL EDEMA 05/01/2008  . Rheumatoid arthritis(714.0) 04/05/2007  . Shortness of breath dyspnea   . TRANSIENT ISCHEMIC ATTACK, HX OF 04/05/2007   ?     Past Surgical History:  Procedure Laterality Date  . BALLOON DILATION N/A 03/12/2013   Procedure: BALLOON DILATION;  Surgeon: Inda Castle, MD;  Location: [facility];  Service: Endoscopy;  Laterality: N/A;  . CATARACT EXTRACTION  2001   bilateral  . CYSTOSCOPY WITH URETHRAL DILATATION N/A 09/24/2015   Procedure: CYSTOSCOPY WITH COOK BALLOON DILATATION OF URETHRAL STRICTURE ;   Surgeon: Carolan Clines, MD;  Location: [facility];  Service: Urology;  Laterality: N/A;  . CYSTOSCOPY WITH URETHRAL DILATATION N/A 07/20/2016   Procedure: CYSTOSCOPY WITH URETHRAL DILATATION;  Surgeon: Alexis Frock, MD;  Location: [facility];  Service: Urology;  Laterality: N/A;  1 HOUR  . ESOPHAGOGASTRODUODENOSCOPY N/A 03/12/2013   Procedure: ESOPHAGOGASTRODUODENOSCOPY (EGD);  Surgeon: Inda Castle, MD;  Location: [facility];  Service: Endoscopy;  Laterality: N/A;  . HEMORRHOID SURGERY    . HERNIA REPAIR     inguinal x5  . KNEE SURGERY  arthroscopic   left  . PROSTATE CRYOABLATION     2000  . SHOULDER SURGERY  few years ago   left  . TRANSURETHRAL RESECTION OF PROSTATE N/A 03/19/2015   Procedure: TRANSURETHRAL RESECTION OF THE PROSTATE (TURP);  Surgeon: Carolan Clines, MD;  Location: [facility];  Service: Urology;  Laterality: N/A;    There were no vitals filed for this visit.       Subjective Assessment - 06/08/17 0942    Subjective Fell down steps and injured RLE on 11/04/17.  Took 6 months for the wound to heal.  When it finally healed one month ago, I realized my hip is bothering me since the fall.  No other falls.  Does not use assistive device-"never"   Pertinent History Wound on lateral aspect of RLE (healed from fall 11/04/17; hx of CHF, DJD, OA   Limitations --  lifting leg to put on pants   Patient Stated Goals Pt's goal is to loosen up R hip and walk better.   Currently in Pain? No/denies            [facility] PT Assessment - 06/08/17 0948      Assessment   Medical Diagnosis muscular deconditioning   Referring Provider Burnice Logan   Onset Date/Surgical Date 11/04/17     Precautions   Precautions Fall     Balance Screen   Has the patient fallen in the past 6 months No  fall 11/04/17   Has the patient had a decrease in activity level because of a fear of falling?  No   Is the patient reluctant to leave their home because of a fear of falling?  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   Leisure Prior to fall in 10/2017, pt was independent and active-no formal exercise program     Observation/Other Assessments   Focus on Therapeutic Outcomes (FOTO)  NA     Posture/Postural Control   Posture/Postural Control Postural limitations   Postural Limitations Forward head     ROM / Strength   AROM / PROM / Strength Strength;AROM     AROM   Overall AROM  Deficits   AROM Assessment Site Hip   Right/Left Hip Right   Right Hip Flexion 47   Right Hip ABduction 25     Strength   Overall Strength Deficits   Strength Assessment Site Hip;Knee;Ankle   Right/Left Hip Right;Left   Right Hip Flexion 3/5   Left Hip Flexion 5/5   Right/Left Knee Right;Left   Right Knee Flexion 4/5   Right Knee Extension 4/5   Left Knee Flexion 5/5   Left Knee Extension 5/5   Right/Left Ankle Right;Left   Right Ankle Dorsiflexion 3+/5   Left Ankle Dorsiflexion 3+/5     Palpation   Palpation comment Pt tender to palpation along iliotibial band on RLE, with trigger points noted along proxima>mid aspect of R IT band.     Transfers   Transfers Sit to Stand;Stand to Sit   Sit to Stand 6: Modified independent (Device/Increase time);With upper extremity assist;From chair/3-in-1   Stand to Sit 6: Modified independent (Device/Increase time);With upper extremity assist;To chair/3-in-1     Ambulation/Gait   Ambulation/Gait Yes   Ambulation/Gait Assistance 5: Supervision;4: Min guard   Ambulation/Gait Assistance  Details Pt fatigued after 100 ft of gait: HR 74 bpm, O2 92-94%.   Ambulation Distance (Feet) 110 Feet   Assistive device None   Gait Pattern Right foot flat;Left foot flat;Right flexed knee in stance;Left flexed knee in stance;Lateral hip instability;Wide base of support   Ambulation Surface Level;Indoor   Gait velocity 12.88 sec = 2.55 ft/sec     Standardized Balance Assessment   Standardized Balance Assessment Timed Up and Go Test     Timed Up and Go Test   Normal TUG (seconds) 20.38  Objective measurements completed on examination: See above findings.          Educated patient in sleeping position on L side with pillow between knees for neutral hip position and seated hamstring stretch, foot propped on 2" block, 3 reps x 30 seconds.        PT Education - 06/08/17 2200    Education provided Yes   Education Details sleeping position with pillow on side; seated hamstring stretch   Person(s) Educated Patient;Spouse   Methods Explanation;Demonstration;Handout   Comprehension Verbalized understanding          PT Short Term Goals - 06/08/17 2210      PT SHORT TERM GOAL #1   Title Pt will perform HEP with wife's supervision, for imrpoved flexibility, strength, balance and gait.  TARGET 07/08/17   Time 4   Period Weeks   Status New     PT SHORT TERM GOAL #2   Title Pt will improve TUG score to less than or equal to 15 seconds for decreased fall risk.   Time 4   Period Weeks   Status New     PT SHORT TERM GOAL #3   Title Pt will ambulate at least 250 ft, using least restrictive assistive device, modified independently,f or improved safety and efficiency with gait.   Time 4   Period Weeks   Status New     PT SHORT TERM GOAL #4   Title Berg Balance test to be assessed, with pt scoring additional 5 points from initial score for imrpoved balance/decreased fall risk.   Time 4   Period Weeks   Status New           PT Long Term Goals - 06/08/17  2212      PT LONG TERM GOAL #1   Title Pt will verbalize understanding of fall prevention in home environment.  TARGET 08/08/17   Time 8   Period Weeks   Status New     PT LONG TERM GOAL #2   Title Pt will improve TUG score to less than or equal to 13.5 seconds for decreased fall risk.   Time 8   Status New     PT LONG TERM GOAL #3   Title Pt will improve gait velocity to at least 2.8 ft/sec for improved gait efficiency and safety.   Time 8   Period Weeks   Status New     PT LONG TERM GOAL #4   Title Pt will demonstrate improved hip ROM by reporting 50% improved ability to put on and off pants and socks.   Time 8   Period Weeks   Status New     PT LONG TERM GOAL #5   Title Pt will ambulate at least 800 ft using least restrictive assistive device, modified independently.   Time 8   Period Weeks   Status New                Plan - 06/08/17 2203    Clinical Impression Statement Pt is a 83 year old male who presents to OP PT status post fall in 11/04/17, resulting in large leg wound on lateral R leg (now healed).  During healing process, pt ability for walking and general activity was significantly decreased, and pt now presenting with gait, balance difficulties, decreased strength and ROM/decreased flexibility, decreased endurance for gait activities, pain along IT band with palpation.  Pt is at fall risk perTUG score and is limited community ambulator based on  gait velocity.  Pt would benefit from skilled PT to address the above stated deficits to decrease fall risk and to improve functional mobility.   History and Personal Factors relevant to plan of care: >3 co-morbidities, family support   Clinical Presentation Evolving   Clinical Presentation due to: healed wound within past month; new onset tightness of IT band and hip in past month, decreased RLE strength   Clinical Decision Making Moderate   Rehab Potential Good   Clinical Impairments Affecting Rehab Potential  Motivated for therapy; wife supportive   PT Frequency 2x / week   PT Duration 8 weeks  eval week is week 1   PT Treatment/Interventions ADLs/Self Care Home Management;Moist Heat;Gait training;Neuromuscular re-education;Balance training;Therapeutic exercise;Therapeutic activities;Functional mobility training;Patient/family education;Manual techniques;Passive range of motion   PT Next Visit Plan IT band massage, IT band stretching-with education to wife; review seated hamstring stretch; additional hip/lower extremity stretching and strengthening activities   Consulted and Agree with Plan of Care Patient;Family member/caregiver      Patient will benefit from skilled therapeutic intervention in order to improve the following deficits and impairments:  Abnormal gait, Decreased balance, Decreased mobility, Decreased endurance, Decreased skin integrity, Decreased strength, Difficulty walking, Impaired flexibility  Visit Diagnosis: Other abnormalities of gait and mobility  Muscle weakness (generalized)  Unsteadiness on feet  Stiffness of right hip, not elsewhere classified      G-Codes - June 15, 2017 01-22-19    Functional Assessment Tool Used (Outpatient Only) TUG 20.38 sec, gt vel 2.55 ft/sec, 100 ft of gait supervision   Functional Limitation Mobility: Walking and moving around   Mobility: Walking and Moving Around Current Status [phone]) At least 20 percent but less than 40 percent impaired, limited or restricted   Mobility: Walking and Moving Around Goal Status [phone]) At least 1 percent but less than 20 percent impaired, limited or restricted       Problem List Patient Active Problem List   Diagnosis Date Noted  . Chronic diastolic CHF (congestive heart failure) ([place]) 11/01/2016  . Essential hypertension 11/01/2016  . Intracranial vascular stenosis 11/09/2015  . Mild cognitive impairment 11/09/2015  . 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 Benign prostatic hyperplasia with urinary  obstruction 03/19/2015  . Awareness alteration, transient 02/26/2015  . PVC's (premature ventricular contractions) 08/22/2013  . Hypothyroidism 05/09/2013  . Stricture and stenosis of esophagus 03/12/2013  . Dysphagia, unspecified(787.20) 02/22/2013  . DOE (dyspnea on exertion) 03/23/2011  . PALPITATIONS 02/12/2010  . CHEST DISCOMFORT 04/25/2009  . PEDAL EDEMA 05/01/2008  . GERD 12/03/2007  . Dyslipidemia 04/05/2007  . BENIGN PROSTATIC HYPERTROPHY 04/05/2007  . Rheumatoid arthritis ([place]) 04/05/2007  . Osteoarthrosis, unspecified whether generalized or localized, unspecified site 04/05/2007  . History of cardiovascular disorder 04/05/2007    Kemonte Ullman W. 06/09/2017, 8:00 AM  Frazier Butt., PT   [facility] [address] [place], [place], [address] Phone: [phone]   Fax:  [phone]  Name: Robert Rowe MRN: [id number] Date of Birth: February 01, 1918

## 2017-06-08 NOTE — Patient Instructions (Addendum)
HIP: Hamstrings - Short Sitting    Rest leg on raised surface. Keep knee straight. Lift chest. Hold ___ seconds. ___ reps per set, ___ sets per day, ___ days per week  Copyright  VHI. All rights reserved.    FOR SLEEPING on your SIDE:  Place a small pillow between your legs to allow for more neutral position with your hips

## 2017-06-09 ENCOUNTER — Ambulatory Visit: Payer: Medicare Other | Admitting: Physical Therapy

## 2017-06-09 DIAGNOSIS — R2689 Other abnormalities of gait and mobility: Secondary | ICD-10-CM | POA: Diagnosis not present

## 2017-06-09 DIAGNOSIS — M25651 Stiffness of right hip, not elsewhere classified: Secondary | ICD-10-CM | POA: Diagnosis not present

## 2017-06-09 DIAGNOSIS — R2681 Unsteadiness on feet: Secondary | ICD-10-CM | POA: Diagnosis not present

## 2017-06-09 DIAGNOSIS — M6281 Muscle weakness (generalized): Secondary | ICD-10-CM | POA: Diagnosis not present

## 2017-06-09 NOTE — Therapy (Signed)
[facility] [address] [place], [place], [address] Phone: [phone]   Fax:  [phone]  Physical Therapy Treatment  Patient Details  Name: Robert Rowe MRN: [id number] Date of Birth: Mar 08, 1918 Referring Provider: Burnice Logan  Encounter Date: 06/09/2017      PT End of Session - 06/09/17 2040    Visit Number 2   Number of Visits 16   Date for PT Re-Evaluation 07/08/17   Authorization Type [employer] Medicare-GCODE every 10th visit   PT Start Time 0933   PT Stop Time 1022   PT Time Calculation (min) 49 min   Activity Tolerance Patient tolerated treatment well   Behavior During Therapy [facility] for tasks assessed/performed      Past Medical History:  Diagnosis Date  . BENIGN PROSTATIC HYPERTROPHY 04/05/2007  . 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 CHEST DISCOMFORT 04/25/2009  . Chronic diastolic CHF (congestive heart failure) ([place])    a. Echo 12/16: mild LVH, EF 55-60%, no RWMA, Gr 1 DD, mild AI, MAC, mild LAE, normal RVF, PASP 37 mmHg  . Chronic kidney disease    chronic   . DJD (degenerative joint disease)   . Dysrhythmia    pvc's   . GERD 12/03/2007  . Hiatal hernia   . History of nuclear stress test    Myoview 1/17: EF 53%, normal perfusion; Low Risk  . HYPERLIPIDEMIA 04/05/2007  . Hypothyroidism   . Osteoarth NOS-Unspec 04/05/2007  . Palpitations 02/12/2010  . PEDAL EDEMA 05/01/2008  . Rheumatoid arthritis(714.0) 04/05/2007  . Shortness of breath dyspnea   . TRANSIENT ISCHEMIC ATTACK, HX OF 04/05/2007   ?     Past Surgical History:  Procedure Laterality Date  . BALLOON DILATION N/A 03/12/2013   Procedure: BALLOON DILATION;  Surgeon: Inda Castle, MD;  Location: [facility];  Service: Endoscopy;  Laterality: N/A;  . CATARACT EXTRACTION  2001   bilateral  . CYSTOSCOPY WITH URETHRAL DILATATION N/A 09/24/2015   Procedure: CYSTOSCOPY WITH COOK BALLOON DILATATION OF URETHRAL STRICTURE ;   Surgeon: Carolan Clines, MD;  Location: [facility];  Service: Urology;  Laterality: N/A;  . CYSTOSCOPY WITH URETHRAL DILATATION N/A 07/20/2016   Procedure: CYSTOSCOPY WITH URETHRAL DILATATION;  Surgeon: Alexis Frock, MD;  [facility];  Service: Urology;  Laterality: N/A;  1 HOUR  . ESOPHAGOGASTRODUODENOSCOPY N/A 03/12/2013   Procedure: ESOPHAGOGASTRODUODENOSCOPY (EGD);  Surgeon: Inda Castle, MD;  Location: [facility];  Service: Endoscopy;  Laterality: N/A;  . HEMORRHOID SURGERY    . HERNIA REPAIR     inguinal x5  . KNEE SURGERY  arthroscopic   left  . PROSTATE CRYOABLATION     2000  . SHOULDER SURGERY  few years ago   left  . TRANSURETHRAL RESECTION OF PROSTATE N/A 03/19/2015   Procedure: TRANSURETHRAL RESECTION OF THE PROSTATE (TURP);  Surgeon: Carolan Clines, MD;  [facility];  Service: Urology;  Laterality: N/A;    There were no vitals filed for this visit.      Subjective Assessment - 06/09/17 0936    Subjective Feel better than I did yesterday.  Not sure why.   Pertinent History Wound on lateral aspect of RLE (healed from fall 11/04/17; hx of CHF, DJD, OA   Limitations --  lifting leg to put on pants   Patient Stated Goals Pt's goal is to loosen up R hip and walk better.   Currently in Pain? No/denies  [facility] Adult PT Treatment/Exercise - 06/09/17 0940      Self-Care   Self-Care Other Self-Care Comments   Other Self-Care Comments  Instructed pt's wife in IT band massage-using heel of hand, using roller along IT band, and using ice massage.      Exercises   Exercises Knee/Hip     Knee/Hip Exercises: Stretches   Active Hamstring Stretch Right;Left;3 reps;30 seconds  seated position, foot propped on 2" block   Active Hamstring Stretch Limitations Review of HEP given yesterday-pt needs verbal cues for technique   Other Knee/Hip Stretches supine single knee to chest stretch 3 x 15 seconds, pulling R knee towards  opposite shoulder, hip external rotation stretch crossing R knee over L, 3 x 15 seconds.     Knee/Hip Exercises: Supine   Other Supine Knee/Hip Exercises Hooklying marching, 10 reps each leg     Manual Therapy   Manual Therapy Other (comment)   Manual therapy comments Massage performed along R iliotibial band, using light pressure with heel of hand; cross friction massage performs on trigger points along superior aspect of R iliotibial band (IT band).     Other Manual Therapy Performed ice massage along R IT band x 3 minutes.  With IT band massage, pt positioned sidelying on L with pillow between knees.  Foam roller along IT band with gentle pressure, 5 reps by PT, the PT instructed patient how to use foam roller as able along IT band in sidelying position.                PT Education - 06/09/17 2039    Education provided Yes   Education Details HEP for stretches and ROM, IT band massage   Person(s) Educated Patient;Spouse   Methods Explanation;Demonstration;Handout   Comprehension Verbalized understanding;Returned demonstration;Verbal cues required          PT Short Term Goals - 06/08/17 2210      PT SHORT TERM GOAL #1   Title Pt will perform HEP with wife's supervision, for imrpoved flexibility, strength, balance and gait.  TARGET 07/08/17   Time 4   Period Weeks   Status New     PT SHORT TERM GOAL #2   Title Pt will improve TUG score to less than or equal to 15 seconds for decreased fall risk.   Time 4   Period Weeks   Status New     PT SHORT TERM GOAL #3   Title Pt will ambulate at least 250 ft, using least restrictive assistive device, modified independently,f or improved safety and efficiency with gait.   Time 4   Period Weeks   Status New     PT SHORT TERM GOAL #4   Title Berg Balance test to be assessed, with pt scoring additional 5 points from initial score for imrpoved balance/decreased fall risk.   Time 4   Period Weeks   Status New           PT  Long Term Goals - 06/08/17 2212      PT LONG TERM GOAL #1   Title Pt will verbalize understanding of fall prevention in home environment.  TARGET 08/08/17   Time 8   Period Weeks   Status New     PT LONG TERM GOAL #2   Title Pt will improve TUG score to less than or equal to 13.5 seconds for decreased fall risk.   Time 8   Status New     PT LONG TERM GOAL #3  Title Pt will improve gait velocity to at least 2.8 ft/sec for improved gait efficiency and safety.   Time 8   Period Weeks   Status New     PT LONG TERM GOAL #4   Title Pt will demonstrate improved hip ROM by reporting 50% improved ability to put on and off pants and socks.   Time 8   Period Weeks   Status New     PT LONG TERM GOAL #5   Title Pt will ambulate at least 800 ft using least restrictive assistive device, modified independently.   Time 8   Period Weeks   Status New               Plan - 06/09/17 Jan 25, 2040    Clinical Impression Statement Initiated additional stretches for hip flexion and hip external rotation today, with iliotibial band massage performed by therapist.  Instructed patient and wife in various ways to try massage to IT band.  Pt will continue to benefit from skilled PT to address flexibility, balance, and gait.   Rehab Potential Good   Clinical Impairments Affecting Rehab Potential Motivated for therapy; wife supportive   PT Frequency 2x / week   PT Duration 8 weeks  eval week is week 1   PT Treatment/Interventions ADLs/Self Care Home Management;Moist Heat;Gait training;Neuromuscular re-education;Balance training;Therapeutic exercise;Therapeutic activities;Functional mobility training;Patient/family education;Manual techniques;Passive range of motion   PT Next Visit Plan Review HEP and IT band massage; BERG balance test, continue hip stretching and add IT band stretch as able.  Gait training with walker?   Consulted and Agree with Plan of Care Patient;Family member/caregiver      Patient  will benefit from skilled therapeutic intervention in order to improve the following deficits and impairments:  Abnormal gait, Decreased balance, Decreased mobility, Decreased endurance, Decreased skin integrity, Decreased strength, Difficulty walking, Impaired flexibility  Visit Diagnosis: Stiffness of right hip, not elsewhere classified       G-Codes - 07-01-2017 January 24, 2219    Functional Assessment Tool Used (Outpatient Only) TUG 20.38 sec, gt vel 2.55 ft/sec, 100 ft of gait supervision   Functional Limitation Mobility: Walking and moving around   Mobility: Walking and Moving Around Current Status [phone]) At least 20 percent but less than 40 percent impaired, limited or restricted   Mobility: Walking and Moving Around Goal Status [phone]) At least 1 percent but less than 20 percent impaired, limited or restricted      Problem List Patient Active Problem List   Diagnosis Date Noted  . Chronic diastolic CHF (congestive heart failure) ([place]) 11/01/2016  . Essential hypertension 11/01/2016  . Intracranial vascular stenosis 11/09/2015  . Mild cognitive impairment 11/09/2015  . 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 Benign prostatic hyperplasia with urinary obstruction 03/19/2015  . Awareness alteration, transient 02/26/2015  . PVC's (premature ventricular contractions) 08/22/2013  . Hypothyroidism 05/09/2013  . Stricture and stenosis of esophagus 03/12/2013  . Dysphagia, unspecified(787.20) 02/22/2013  . DOE (dyspnea on exertion) 03/23/2011  . PALPITATIONS 02/12/2010  . CHEST DISCOMFORT 04/25/2009  . PEDAL EDEMA 05/01/2008  . GERD 12/03/2007  . Dyslipidemia 04/05/2007  . BENIGN PROSTATIC HYPERTROPHY 04/05/2007  . Rheumatoid arthritis ([place]) 04/05/2007  . Osteoarthrosis, unspecified whether generalized or localized, unspecified site 04/05/2007  . History of cardiovascular disorder 04/05/2007    Frazier Butt. 06/09/2017, 8:45 PM Frazier Butt., PT  [facility] [address] [place], [place], [address] Phone: [phone]   Fax:  [phone]  Name: Brantly Kalman  [place] MRN: [id number] Date of Birth: 15-Aug-1918

## 2017-06-09 NOTE — Patient Instructions (Addendum)
HIP: Hamstrings - Short Sitting    Rest leg on raised surface. Keep knee straight. Lift chest. Hold _30__ seconds. _3__ reps per set, _1-2__ sets per day  Copyright  VHI. All rights reserved.  Knee to Chest: Advanced    Lie with both legs bent. Bring right knee up, then return down; alternate lifting legs, as in marching.  Repeat 10 times each side. Do __1-2_ sets, __1-2_ times per day.  http://ss.exer.us/10   Copyright  VHI. All rights reserved.  Knee to Chest (Flexion)    Pull knee toward chest. Feel stretch in lower back or buttock area. Breathing deeply, Hold __15-30__ seconds. Repeat with other knee. Repeat __3__ times. Do __1-2__ sessions per day.  http://gt2.exer.us/225   Copyright  VHI. All rights reserved.    ILIOTIBIAL BAND MASSAGE:  You will want to lie on your left side, with a pillow between your knees.  1)Using the heel of your hand (or the heel of wife's hand), gently massage along IT band, from R hip to outside of R knee.  Repeat 5-10 times.    2)  Using foam roller, you (or your wife) can gently (with light pressure) massage the IT band from your R hip to the R knee.  Repeat 5-10 times.  3)  Ice massage technique (have a styrofoam cup half filled with water and frozen.  Tear away the styrofoam until the ice is showing).  Massage the IT band area from the hip to your knee, with gentle pressure for 3-5 minutes.

## 2017-06-13 ENCOUNTER — Ambulatory Visit (INDEPENDENT_AMBULATORY_CARE_PROVIDER_SITE_OTHER): Payer: Medicare Other | Admitting: Internal Medicine

## 2017-06-13 ENCOUNTER — Ambulatory Visit: Payer: Medicare Other | Admitting: Physical Therapy

## 2017-06-13 ENCOUNTER — Encounter: Payer: Self-pay | Admitting: Internal Medicine

## 2017-06-13 VITALS — BP 94/68 | HR 82 | Wt 140.6 lb

## 2017-06-13 DIAGNOSIS — R2681 Unsteadiness on feet: Secondary | ICD-10-CM

## 2017-06-13 DIAGNOSIS — R5383 Other fatigue: Secondary | ICD-10-CM

## 2017-06-13 DIAGNOSIS — I10 Essential (primary) hypertension: Secondary | ICD-10-CM | POA: Diagnosis not present

## 2017-06-13 DIAGNOSIS — R942 Abnormal results of pulmonary function studies: Secondary | ICD-10-CM | POA: Diagnosis not present

## 2017-06-13 DIAGNOSIS — R0609 Other forms of dyspnea: Secondary | ICD-10-CM

## 2017-06-13 DIAGNOSIS — R2689 Other abnormalities of gait and mobility: Secondary | ICD-10-CM | POA: Diagnosis not present

## 2017-06-13 DIAGNOSIS — M25651 Stiffness of right hip, not elsewhere classified: Secondary | ICD-10-CM

## 2017-06-13 DIAGNOSIS — R531 Weakness: Secondary | ICD-10-CM | POA: Insufficient documentation

## 2017-06-13 DIAGNOSIS — M6281 Muscle weakness (generalized): Secondary | ICD-10-CM | POA: Diagnosis not present

## 2017-06-13 DIAGNOSIS — R0600 Dyspnea, unspecified: Secondary | ICD-10-CM

## 2017-06-13 NOTE — Patient Instructions (Addendum)

## 2017-06-13 NOTE — Progress Notes (Signed)
Cardiology Office Note   Date:  06/13/2017   ID:  Robert Rowe, DOB 06/11/1918, MRN [id number]  PCP:  Marletta Lor, MD   CC: Chronic dyspnea   History of Present Illness: Robert Rowe is a 83 y.o. male who presents for scheduled follow-up office visit.  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 He has had numerous endoscopies and esophageal dilatations in the past. He does have a history of chest pain often triggered by alcohol ingestion or overeating. He had chest pain at [facility] in 2014. Stress test there was negative. Last seen by Dr. [name] 8/16.  Admitted 11/15/15 with chest pain. He had minimal relief with nitroglycerin at home. Cardiac enzymes remained negative. Telemetry was unremarkable. PVCs were noted.   He was seen 12/16 for volume excess. He was placed on Lasix. Echo demonstrated normal LVF, mild diastolic dysfunction, mild to mod pulmonary HTN.   He has been worried about his renal function.  He has tried to stay off of meloxicam as much as possible and he is also trying to cut back on his Protonix.  He has furosemide 40 mg on hand but does not take it every day.  He skips it if he is traveling.  The patient had blood work on 01/14/16 which showed that his renal function is stable with creatinine 1.6 to and his B natruretic peptide was normal at 36.5.  He has had mild bilateral peripheral edema which we have suspected is secondary to venous insufficiency rather than to ongoing congestive heart failure..  05/04/2016  I the pleasure of meeting [name] today in the office. He is a former patient of Dr. 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 He was one of the original medical students at [employer]. He has no known cardiac history but does have a history of high cholesterol and possible diastolic  dysfunction. He takes Lasix as needed for shortness of breath and swelling. He tends to use it as needed.  11/01/2016  [name] returns today for follow-up. He reports that his main concern is dyspnea. He says this is been present for months but recently is he comes somewhat worse. Workup earlier this year indicated stable systolic function with a low BNP of 36.5. He has chronic venous insufficiency which is likely the cause of his lower extremity swelling. He denies any orthopnea or PND. He does demonstrate some pursed lip breathing today in the office. He denies any wheezing, cough or chest pain. Weight is up about 4 pounds since his last office visit that he reports that he thinks she's been "eating too well.  06/13/2017  [name] returns today for follow-up. He is again reporting problems with shortness of breath. When I last saw him we have recommended an increase in his Lasix however he declined that. We also ordered pulmonary function tests which he did get. This demonstrated a severe diffusion defect. We did contact him with those results of that was possibly provided BMI chart. He says that he never received results, or also says that he felt that the results were normal. This is not the case, in fact we had referred him to pulmonary for evaluation however he refused the referral because he felt that the testing was normal. I explained to him again today that there was a  significant diffusion defect. He reports shortness of breath with exertion. In the office we walked him around to get on the ambulatory oxygen saturation. His oxygen saturation dropped to 86% with ambulation became up to 96% on 2 L oxygen.  Past Medical History:  Diagnosis Date  . BENIGN PROSTATIC HYPERTROPHY 04/05/2007  . 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 CHEST DISCOMFORT 04/25/2009  . Chronic diastolic CHF (congestive heart failure) ([place])    a. Echo 12/16: mild LVH, EF 55-60%, no RWMA, Gr 1 DD, mild  AI, MAC, mild LAE, normal RVF, PASP 37 mmHg  . Chronic kidney disease    chronic   . DJD (degenerative joint disease)   . Dysrhythmia    pvc's   . GERD 12/03/2007  . Hiatal hernia   . History of nuclear stress test    Myoview 1/17: EF 53%, normal perfusion; Low Risk  . HYPERLIPIDEMIA 04/05/2007  . Hypothyroidism   . Osteoarth NOS-Unspec 04/05/2007  . Palpitations 02/12/2010  . PEDAL EDEMA 05/01/2008  . Rheumatoid arthritis(714.0) 04/05/2007  . Shortness of breath dyspnea   . TRANSIENT ISCHEMIC ATTACK, HX OF 04/05/2007   ?     Past Surgical History:  Procedure Laterality Date  . BALLOON DILATION N/A 03/12/2013   Procedure: BALLOON DILATION;  Surgeon: Robert Castle, MD;  Location: [facility];  Service: Endoscopy;  Laterality: N/A;  . CATARACT EXTRACTION  2001   bilateral  . CYSTOSCOPY WITH URETHRAL DILATATION N/A 09/24/2015   Procedure: CYSTOSCOPY WITH COOK BALLOON DILATATION OF URETHRAL STRICTURE ;  Surgeon: Robert Clines, MD;  [facility];  Service: Urology;  Laterality: N/A;  . CYSTOSCOPY WITH URETHRAL DILATATION N/A 07/20/2016   Procedure: CYSTOSCOPY WITH URETHRAL DILATATION;  Surgeon: Robert Frock, MD;  [facility];  Service: Urology;  Laterality: N/A;  1 HOUR  . ESOPHAGOGASTRODUODENOSCOPY N/A 03/12/2013   Procedure: ESOPHAGOGASTRODUODENOSCOPY (EGD);  Surgeon: Robert Castle, MD;  Location: [facility];  Service: Endoscopy;  Laterality: N/A;  . HEMORRHOID SURGERY    . HERNIA REPAIR     inguinal x5  . KNEE SURGERY  arthroscopic   left  . PROSTATE CRYOABLATION     2000  . SHOULDER SURGERY  few years ago   left  . TRANSURETHRAL RESECTION OF PROSTATE N/A 03/19/2015   Procedure: TRANSURETHRAL RESECTION OF THE PROSTATE (TURP);  Surgeon: Robert Clines, MD;  [facility];  Service: Urology;  Laterality: N/A;     Current Outpatient Prescriptions  Medication Sig Dispense Refill  . clopidogrel (PLAVIX) 75 MG tablet Take 1 tablet (75 mg total) by mouth  daily. 90 tablet 1  . furosemide (LASIX) 20 MG tablet Take 20 mg by mouth daily as needed for fluid or edema.   10  . levothyroxine (SYNTHROID, LEVOTHROID) 50 MCG tablet TAKE 1 TABLET (50 MCG TOTAL) BY MOUTH DAILY. 90 tablet 3  . metoprolol succinate (TOPROL-XL) 25 MG 24 hr tablet Take 1 tablet (25 mg total) by mouth at bedtime. 30 tablet 1  . Multiple Vitamin (MULTIVITAMIN WITH MINERALS) TABS tablet Take 1 tablet by mouth 3 (three) times a week.    . nitroGLYCERIN (NITROSTAT) 0.4 MG SL tablet Place 0.4 mg under the tongue every 5 (five) minutes as needed (ESOPHEAGEAL SPASPMS). Reported on 03/01/2016    . pantoprazole (PROTONIX) 40 MG tablet Take 1 tablet (40 mg total) by mouth daily. 90 tablet 1   No current facility-administered medications for this visit.     Allergies:  Sulfonamide derivatives    Social History:  The patient  reports that he quit smoking about 63 years ago. His smoking use included Cigarettes. He has never used smokeless tobacco. He reports that he drinks alcohol. He reports that he does not use drugs.   Family History:  The patient's family history includes Breast cancer in his mother; Heart attack in his father; Heart disease in his father; Lymphoma in his brother.    ROS:   Pertinent items noted in HPI and remainder of comprehensive ROS otherwise negative.  PHYSICAL EXAM: VS:  BP 94/68   Pulse 82   Wt 140 lb 9.6 oz (63.8 kg)   BMI 25.72 kg/m  , BMI Body mass index is 25.72 kg/m. General appearance: alert and no distress Lungs: diminished breath sounds bilaterally Heart: regular rate and rhythm Extremities: extremities normal, atraumatic, no cyanosis or edema Neurologic: Grossly normal  EKG:   Normal sinus rhythm at 82 - personally reviewed  Recent Labs: 07/18/2016: BUN 16; Creatinine, Ser 1.67; Hemoglobin 13.8; Platelets 601; Potassium 4.9; Sodium 136    Lipid Panel    Component Value Date/Time   CHOL 255 (H) 03/24/2016 1328   TRIG 327.0 (H)  03/24/2016 1328   HDL 37.90 (L) 03/24/2016 1328   CHOLHDL 7 03/24/2016 1328   VLDL 65.4 (H) 03/24/2016 1328   LDLCALC 116 (H) 11/15/2015 0421   LDLDIRECT 175.0 03/24/2016 1328      Wt Readings from Last 3 Encounters:  06/13/17 140 lb 9.6 oz (63.8 kg)  11/29/16 153 lb 3.2 oz (69.5 kg)  11/22/16 153 lb (69.4 kg)      ASSESSMENT: 1. Dyspnea - severe diffusion defect on PFT's 2. Hypertension 3. Chronic diastolic heart failure 4. CKD 3 5. PVCs  PLAN: [name] reports chronic dyspnea. There is no evidence for heart failure. He will not take additional Lasix due to problems with excess urination. I again explained to him that he had abnormal diffusion findings on his PFTs and encouraged him to see a pulmonologist. He said that that would not be of any benefit. I offered to set him up on oxygen based on his low oxygen saturations with ambulation and he also declined that. He said he would not wear it. Ultimately I feel that he may develop progressive shortness of breath and will be short of breath at rest at which time I suspect he will contact us for oxygen. I'm concerned that he has had significant memory loss recently and often gets very confused about things.  Follow-up in 6 months.  Pixie Casino, MD, [facility] Attending Cardiologist [facility]   06/13/2017 1:55 PM

## 2017-06-14 ENCOUNTER — Telehealth: Payer: Self-pay | Admitting: Internal Medicine

## 2017-06-14 MED ORDER — METOPROLOL SUCCINATE ER 25 MG PO TB24: 25.0000 mg | ORAL_TABLET | Freq: Every day | ORAL | 1 refills | Status: DC

## 2017-06-14 NOTE — Therapy (Signed)
[facility] [address] [place], [place], [address] Phone: [phone]   Fax:  [phone]  Physical Therapy Treatment  Patient Details  Name: Robert Rowe MRN: [id number] Date of Birth: [birth year] Referring Provider: Burnice Logan  Encounter Date: 06/13/2017      PT End of Session - 06/14/17 0813    Visit Number 3   Number of Visits 16   Date for PT Re-Evaluation 07/08/17   Authorization Type U[employer] Medicare-GCODE every 10th visit   PT Start Time 1105   PT Stop Time 1147   PT Time Calculation (min) 42 min   Activity Tolerance Patient tolerated treatment well   Behavior During Therapy [facility] for tasks assessed/performed      Past Medical History:  Diagnosis Date  . BENIGN PROSTATIC HYPERTROPHY 04/05/2007  . 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 CHEST DISCOMFORT 04/25/2009  . Chronic diastolic CHF (congestive heart failure) ([place])    a. Echo 12/16: mild LVH, EF 55-60%, no RWMA, Gr 1 DD, mild AI, MAC, mild LAE, normal RVF, PASP 37 mmHg  . Chronic kidney disease    chronic   . DJD (degenerative joint disease)   . Dysrhythmia    pvc's   . GERD 12/03/2007  . Hiatal hernia   . History of nuclear stress test    Myoview 1/17: EF 53%, normal perfusion; Low Risk  . HYPERLIPIDEMIA 04/05/2007  . Hypothyroidism   . Osteoarth NOS-Unspec 04/05/2007  . Palpitations 02/12/2010  . PEDAL EDEMA 05/01/2008  . Rheumatoid arthritis(714.0) 04/05/2007  . Shortness of breath dyspnea   . TRANSIENT ISCHEMIC ATTACK, HX OF 04/05/2007   ?     Past Surgical History:  Procedure Laterality Date  . BALLOON DILATION N/A 03/12/2013   Procedure: BALLOON DILATION;  Surgeon: Inda Castle, MD;  Location: [facility];  Service: Endoscopy;  Laterality: N/A;  . CATARACT EXTRACTION  2001   bilateral  . CYSTOSCOPY WITH URETHRAL DILATATION N/A 09/24/2015   Procedure: CYSTOSCOPY WITH COOK BALLOON DILATATION OF URETHRAL STRICTURE ;   Surgeon: Carolan Clines, MD;  Location: [facility];  Service: Urology;  Laterality: N/A;  . CYSTOSCOPY WITH URETHRAL DILATATION N/A 07/20/2016   Procedure: CYSTOSCOPY WITH URETHRAL DILATATION;  Surgeon: Alexis Frock, MD;  Location: [facility];  Service: Urology;  Laterality: N/A;  1 HOUR  . ESOPHAGOGASTRODUODENOSCOPY N/A 03/12/2013   Procedure: ESOPHAGOGASTRODUODENOSCOPY (EGD);  Surgeon: Inda Castle, MD;  Location: [facility];  Service: Endoscopy;  Laterality: N/A;  . HEMORRHOID SURGERY    . HERNIA REPAIR     inguinal x5  . KNEE SURGERY  arthroscopic   left  . PROSTATE CRYOABLATION     2000  . SHOULDER SURGERY  few years ago   left  . TRANSURETHRAL RESECTION OF PROSTATE N/A 03/19/2015   Procedure: TRANSURETHRAL RESECTION OF THE PROSTATE (TURP);  Surgeon: Carolan Clines, MD;  Location: [facility];  Service: Urology;  Laterality: N/A;    There were no vitals filed for this visit.      Subjective Assessment - 06/13/17 1109    Subjective I'm fair today.  I'll be honest, my memory is so bad, that I haven't remembered to do the exercises at all over the weekend.  If you ask my wife, she'll remember to help me. (Upon bringing wife back at end of session, she reports he will not do them with her)   Pertinent History Wound on lateral aspect of RLE (healed from fall 11/04/17; hx  of CHF, DJD, OA   Limitations --  lifting leg to put on pants   Patient Stated Goals Pt's goal is to loosen up R hip and walk better.   Currently in Pain? No/denies                         [facility] Adult PT Treatment/Exercise - 06/14/17 0806      Ambulation/Gait   Ambulation/Gait Yes   Ambulation/Gait Assistance 5: Supervision;4: Min guard   Ambulation/Gait Assistance Details Cues provided for upright posture, slowed pace of gait, and to stay within BOS of RW   Ambulation Distance (Feet) 230 Feet   Assistive device Rolling walker   Gait Pattern Right foot flat;Left foot flat;Right flexed knee in  stance;Left flexed knee in stance;Lateral hip instability;Wide base of support   Ambulation Surface Level;Indoor   Pre-Gait Activities Discussed benefits of using RW or rollator (currently, they have a rollator at [facility], but it is difficult to transport, so they want to purchase a rolling walker for home use in [place]; therefore RW tried at end of session today).  Discussed that use of RW would allow for improved posture, for improved gait distance prior to fatigue (pt amb 230 ft before c/o fatigue today) and improved safety-based on Berg score 36/56-indicates need for walker as assistive device.     Standardized Balance Assessment   Standardized Balance Assessment Berg Balance Test     Berg Balance Test   Sit to Stand Able to stand  independently using hands   Standing Unsupported Able to stand safely 2 minutes   Sitting with Back Unsupported but Feet Supported on Floor or Stool Able to sit safely and securely 2 minutes   Stand to Sit Controls descent by using hands   Transfers Able to transfer with verbal cueing and /or supervision   Standing Unsupported with Eyes Closed Able to stand 10 seconds with supervision   Standing Ubsupported with Feet Together Able to place feet together independently and stand for 1 minute with supervision   From Standing, Reach Forward with Outstretched Arm Can reach forward >12 cm safely (5")   From Standing Position, Pick up Object from [place] to pick up shoe, needs supervision   From Standing Position, Turn to Look Behind Over each Shoulder Looks behind from both sides and weight shifts well   Turn 360 Degrees Able to turn 360 degrees safely but slowly   Standing Unsupported, Alternately Place Feet on Step/Stool Able to complete >2 steps/needs minimal assist   Standing Unsupported, One Foot in Front Needs help to step but can hold 15 seconds   Standing on One Leg Unable to try or needs assist to prevent fall   Total Score 36     Knee/Hip  Exercises: Stretches   Active Hamstring Stretch Right;Left;3 reps;30 seconds  seated propped on 4" block   Active Hamstring Stretch Limitations Review of HEP-PT provides cues for technique   Other Knee/Hip Stretches supine single knee to chest stretch 3 x 15 seconds, pulling R knee towards opposite shoulder, hip external rotation stretch crossing R knee over L, 3 x 15 seconds.   Other Knee/Hip Stretches Attempted IT band stretch in supine, with RLE extended, LLE bent and crossed over RLE, trying to pull RLE inward for IT band stretch.  Attempted x 2 with PT assist, but pt c/o discomfort in L hip.    Hooklying marching in place, x 10 reps each leg.  Review  of HEP exercise from last visit. Educated patient and wife on importance of follow-through and performance of exercises at home.            PT Education - 06/14/17 [phone]    Education provided Yes   Education Details discussed results of Berg Balance test, likely benefit from use of walker as assistive device for gait safety; discussed need for patient to perform stretches and allow wife to try IT band massage at home   Person(s) Educated Patient;Spouse  Wife present at end of session for education   Methods Explanation;Verbal cues   Comprehension Verbalized understanding;Verbal cues required          PT Short Term Goals - 06/08/17 2210      PT SHORT TERM GOAL #1   Title Pt will perform HEP with wife's supervision, for imrpoved flexibility, strength, balance and gait.  TARGET 07/08/17   Time 4   Period Weeks   Status New     PT SHORT TERM GOAL #2   Title Pt will improve TUG score to less than or equal to 15 seconds for decreased fall risk.   Time 4   Period Weeks   Status New     PT SHORT TERM GOAL #3   Title Pt will ambulate at least 250 ft, using least restrictive assistive device, modified independently,f or improved safety and efficiency with gait.   Time 4   Period Weeks   Status New     PT SHORT TERM GOAL #4    Title Berg Balance test to be assessed, with pt scoring additional 5 points from initial score for imrpoved balance/decreased fall risk.   Time 4   Period Weeks   Status New           PT Long Term Goals - 06/08/17 2212      PT LONG TERM GOAL #1   Title Pt will verbalize understanding of fall prevention in home environment.  TARGET 08/08/17   Time 8   Period Weeks   Status New     PT LONG TERM GOAL #2   Title Pt will improve TUG score to less than or equal to 13.5 seconds for decreased fall risk.   Time 8   Status New     PT LONG TERM GOAL #3   Title Pt will improve gait velocity to at least 2.8 ft/sec for improved gait efficiency and safety.   Time 8   Period Weeks   Status New     PT LONG TERM GOAL #4   Title Pt will demonstrate improved hip ROM by reporting 50% improved ability to put on and off pants and socks.   Time 8   Period Weeks   Status New     PT LONG TERM GOAL #5   Title Pt will ambulate at least 800 ft using least restrictive assistive device, modified independently.   Time 8   Period Weeks   Status New               Plan - 06/14/17 0813    Clinical Impression Statement Reviewed stretches for hip as part of HEP from last visit (pt reports not doing them to bad memory and wife reports trying to get patient to do, but he refuses at home).  Verbally reviewed IT-band massage, but again, not performed at home.  Berg Balance test peformed, with pt at fall risk per 36/56 score; trialed and recommended use of rolling walker for safety with  walking in the home.   Rehab Potential Good   Clinical Impairments Affecting Rehab Potential Motivated for therapy; wife supportive   PT Frequency 2x / week   PT Duration 8 weeks  eval week is week 1   PT Treatment/Interventions ADLs/Self Care Home Management;Moist Heat;Gait training;Neuromuscular re-education;Balance training;Therapeutic exercise;Therapeutic activities;Functional mobility training;Patient/family  education;Manual techniques;Passive range of motion   PT Next Visit Plan Continue gait training with RW (or rollator[place] continue to address hip tightness/weakness with stretches and exercises and add to HEP   Consulted and Agree with Plan of Care Patient;Family member/caregiver   Family Member Consulted wife present at end of session      Patient will benefit from skilled therapeutic intervention in order to improve the following deficits and impairments:  Abnormal gait, Decreased balance, Decreased mobility, Decreased endurance, Decreased skin integrity, Decreased strength, Difficulty walking, Impaired flexibility  Visit Diagnosis: Stiffness of right hip, not elsewhere classified  Unsteadiness on feet  Other abnormalities of gait and mobility     Problem List Patient Active Problem List   Diagnosis Date Noted  . Other fatigue 06/13/2017  . Abnormal diffusion capacity determined by pulmonary function test 06/13/2017  . Chronic diastolic CHF (congestive heart failure) ([place]) 11/01/2016  . Essential hypertension 11/01/2016  . Intracranial vascular stenosis 11/09/2015  . Mild cognitive impairment 11/09/2015  . 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 Benign prostatic hyperplasia with urinary obstruction 03/19/2015  . Awareness alteration, transient 02/26/2015  . PVC's (premature ventricular contractions) 08/22/2013  . Hypothyroidism 05/09/2013  . Stricture and stenosis of esophagus 03/12/2013  . Dysphagia, unspecified(787.20) 02/22/2013  . DOE (dyspnea on exertion) 03/23/2011  . PALPITATIONS 02/12/2010  . CHEST DISCOMFORT 04/25/2009  . PEDAL EDEMA 05/01/2008  . GERD 12/03/2007  . Dyslipidemia 04/05/2007  . BENIGN PROSTATIC HYPERTROPHY 04/05/2007  . Rheumatoid arthritis ([place]) 04/05/2007  . Osteoarthrosis, unspecified whether generalized or localized, unspecified site 04/05/2007  . History of cardiovascular disorder 04/05/2007     Frazier Butt. 06/14/2017, 8:22 AM  Frazier Butt., PT   [facility] [address] [place], [place], [address] Phone: [phone]   Fax:  [phone]  Name: Robert Rowe MRN: [id number] Date of Birth: 1918-03-28

## 2017-06-14 NOTE — Telephone Encounter (Signed)
Contact Robert Rowe, notified him his medication, Metoprolol 25 mg was refilled at his pharmacy [facility].

## 2017-06-14 NOTE — Telephone Encounter (Signed)
New message   Rachel Bo from [facility] is calling asking if pt can have 90 day supply.  *STAT* If patient is at the pharmacy, call can be transferred to refill team.   1. Which medications need to be refilled? (please list name of each medication and dose if known) metoprolol succinate 25 mg   2. Which pharmacy/location (including street and city if local pharmacy) is medication to be sent to? [place]  3. Do they need a 30 day or 90 day supply? 90 day

## 2017-06-15 ENCOUNTER — Ambulatory Visit: Payer: Medicare Other | Admitting: Physical Therapy

## 2017-06-15 DIAGNOSIS — M25651 Stiffness of right hip, not elsewhere classified: Secondary | ICD-10-CM | POA: Diagnosis not present

## 2017-06-15 DIAGNOSIS — M6281 Muscle weakness (generalized): Secondary | ICD-10-CM | POA: Diagnosis not present

## 2017-06-15 DIAGNOSIS — R2681 Unsteadiness on feet: Secondary | ICD-10-CM | POA: Diagnosis not present

## 2017-06-15 DIAGNOSIS — R2689 Other abnormalities of gait and mobility: Secondary | ICD-10-CM | POA: Diagnosis not present

## 2017-06-15 NOTE — Patient Instructions (Addendum)
Hip Stretch    Put right ankle over left knee. Let right knee fall downward, but keep ankle in place. Feel the stretch in hip. May push down gently with hand to feel stretch. Hold _30__ seconds while counting out loud. Repeat with other leg. Repeat _3_ times. Do _1-2___ sessions per day.  http://gt2.exer.us/497   Copyright  VHI. All rights reserved.  High Stepping in Place (Sitting)    Sitting, alternately lift knees as high as possible. Keep trunk tall. Repeat __10__ times, each leg.  Copyright  VHI. All rights reserved.

## 2017-06-16 NOTE — Therapy (Signed)
[facility] [address] [place], [place], [address] Phone: [phone]   Fax:  [phone]  Physical Therapy Treatment  Patient Details  Name: Robert Rowe MRN: [id number] Date of Birth: 1918/05/11 Referring Provider: Burnice Logan  Encounter Date: 06/15/2017      PT End of Session - 06/16/17 1252    Visit Number 4   Number of Visits 16   Date for PT Re-Evaluation 07/08/17   Authorization Type UHC Medicare-GCODE every 10th visit   PT Start Time 1448   PT Stop Time 1528  multiple rest breaks in session due to fatigue/SOB   PT Time Calculation (min) 40 min   Activity Tolerance Patient limited by fatigue   Behavior During Therapy [facility] for tasks assessed/performed      Past Medical History:  Diagnosis Date  . BENIGN PROSTATIC HYPERTROPHY 04/05/2007  . 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 CHEST DISCOMFORT 04/25/2009  . Chronic diastolic CHF (congestive heart failure) ([place])    a. Echo 12/16: mild LVH, EF 55-60%, no RWMA, Gr 1 DD, mild AI, MAC, mild LAE, normal RVF, PASP 37 mmHg  . Chronic kidney disease    chronic   . DJD (degenerative joint disease)   . Dysrhythmia    pvc's   . GERD 12/03/2007  . Hiatal hernia   . History of nuclear stress test    Myoview 1/17: EF 53%, normal perfusion; Low Risk  . HYPERLIPIDEMIA 04/05/2007  . Hypothyroidism   . Osteoarth NOS-Unspec 04/05/2007  . Palpitations 02/12/2010  . PEDAL EDEMA 05/01/2008  . Rheumatoid arthritis(714.0) 04/05/2007  . Shortness of breath dyspnea   . TRANSIENT ISCHEMIC ATTACK, HX OF 04/05/2007   ?     Past Surgical History:  Procedure Laterality Date  . BALLOON DILATION N/A 03/12/2013   Procedure: BALLOON DILATION;  Surgeon: Inda Castle, MD;  Location: [facility];  Service: Endoscopy;  Laterality: N/A;  . CATARACT EXTRACTION  2001   bilateral  . CYSTOSCOPY WITH URETHRAL DILATATION N/A 09/24/2015   Procedure: CYSTOSCOPY WITH COOK  BALLOON DILATATION OF URETHRAL STRICTURE ;  Surgeon: Carolan Clines, MD;  Location: [facility];  Service: Urology;  Laterality: N/A;  . CYSTOSCOPY WITH URETHRAL DILATATION N/A 07/20/2016   Procedure: CYSTOSCOPY WITH URETHRAL DILATATION;  Surgeon: Alexis Frock, MD;  Location: [facility];  Service: Urology;  Laterality: N/A;  1 HOUR  . ESOPHAGOGASTRODUODENOSCOPY N/A 03/12/2013   Procedure: ESOPHAGOGASTRODUODENOSCOPY (EGD);  Surgeon: Inda Castle, MD;  Location: [facility];  Service: Endoscopy;  Laterality: N/A;  . HEMORRHOID SURGERY    . HERNIA REPAIR     inguinal x5  . KNEE SURGERY  arthroscopic   left  . PROSTATE CRYOABLATION     2000  . SHOULDER SURGERY  few years ago   left  . TRANSURETHRAL RESECTION OF PROSTATE N/A 03/19/2015   Procedure: TRANSURETHRAL RESECTION OF THE PROSTATE (TURP);  Surgeon: Carolan Clines, MD;  Location: [facility];  Service: Urology;  Laterality: N/A;    There were no vitals filed for this visit.      Subjective Assessment - 06/15/17 1454    Subjective I'm so very short of breath today and tired.  Been exercising alot by going out with my son looking around at houses.  Also did my exercises.   Pertinent History Wound on lateral aspect of RLE (healed from fall 11/04/17; hx of CHF, DJD, OA   Limitations --  lifting leg to put on pants  Patient Stated Goals Pt's goal is to loosen up R hip and walk better.   Currently in Pain? No/denies                         [facility] Adult PT Treatment/Exercise - 06/15/17 1515      Transfers   Transfers Sit to Stand;Stand to Sit   Sit to Stand 6: Modified independent (Device/Increase time);With upper extremity assist;From chair/3-in-1   Stand to Sit 6: Modified independent (Device/Increase time);With upper extremity assist;To chair/3-in-1   Transfer Cueing Cues provided for hand placement for sit<>stand from mat to walker.  Pt consistently pulls up from walker to stand and keeps hands on walker when sitting,  despite cues.     Ambulation/Gait   Ambulation/Gait Yes   Ambulation/Gait Assistance 5: Supervision;4: Min guard   Ambulation/Gait Assistance Details Initial cues provided to stay within walker BOS and for upright posture, to slow pace of gait.   Ambulation Distance (Feet) 115 Feet  x 2, then 60 ft   Assistive device Rolling walker   Gait Pattern Right foot flat;Left foot flat;Right flexed knee in stance;Left flexed knee in stance;Lateral hip instability;Wide base of support   Ambulation Surface Level;Indoor   Gait Comments Vital signs taken several times after session, including after gait:  O2 91-94%, HR 99 bpm     Knee/Hip Exercises: Seated   Other Seated Knee/Hip Exercises Seated marching, 10 reps; then seated external rotation stretch 3 x 30 seconds each leg.   Sit to Sand 5 reps;with UE support     Knee/Hip Exercises: Supine   Heel Slides AROM;Right;Left;1 set;10 reps   Straight Leg Raises AROM;Strengthening;Right;Left;1 set;10 reps   Other Supine Knee/Hip Exercises Hip abduction in supine, 10 reps each leg.  Hooklying hip abduction RLE x 10 reps (no resistance)      Attempted seated "bag" exercises to improve hip/knee flexion in sitting, to simulate donning/doffing clothes, but pt holds breath and reports it is too tight.           PT Education - 06/16/17 1251    Education provided Yes   Education Details Additions to HEP-reviewed need to use rolling walker at home, with cues on safe use of RW (Educated wife at end of session)   Person(s) Educated Patient;Spouse   Methods Explanation;Demonstration;Verbal cues;Handout   Comprehension Verbalized understanding;Returned demonstration;Verbal cues required;Need further instruction  Wife verbalizes understanding at end of session, but verbalizes that patient will not comply with her attempts at exercise with him          PT Short Term Goals - 06/08/17 2210      PT SHORT TERM GOAL #1   Title Pt will perform HEP with  wife's supervision, for imrpoved flexibility, strength, balance and gait.  TARGET 07/08/17   Time 4   Period Weeks   Status New     PT SHORT TERM GOAL #2   Title Pt will improve TUG score to less than or equal to 15 seconds for decreased fall risk.   Time 4   Period Weeks   Status New     PT SHORT TERM GOAL #3   Title Pt will ambulate at least 250 ft, using least restrictive assistive device, modified independently,f or improved safety and efficiency with gait.   Time 4   Period Weeks   Status New     PT SHORT TERM GOAL #4   Title Berg Balance test to be assessed, with pt scoring  additional 5 points from initial score for imrpoved balance/decreased fall risk.   Time 4   Period Weeks   Status New           PT Long Term Goals - 06/08/17 2212      PT LONG TERM GOAL #1   Title Pt will verbalize understanding of fall prevention in home environment.  TARGET 08/08/17   Time 8   Period Weeks   Status New     PT LONG TERM GOAL #2   Title Pt will improve TUG score to less than or equal to 13.5 seconds for decreased fall risk.   Time 8   Status New     PT LONG TERM GOAL #3   Title Pt will improve gait velocity to at least 2.8 ft/sec for improved gait efficiency and safety.   Time 8   Period Weeks   Status New     PT LONG TERM GOAL #4   Title Pt will demonstrate improved hip ROM by reporting 50% improved ability to put on and off pants and socks.   Time 8   Period Weeks   Status New     PT LONG TERM GOAL #5   Title Pt will ambulate at least 800 ft using least restrictive assistive device, modified independently.   Time 8   Period Weeks   Status New               Plan - 06/16/17 1253    Clinical Impression Statement Pt more visibly fatigued and short of breath today (he has been out with son to lunch and riding around to houses).  Vital signs monitored through session, but O2 sats 91-94% each time checked.  Pt requires multiple seated rest breaks.  Provided  additions to HEP and continued gait training with RW. Educated wife at end of session in new seated exercises, but wife reports she has been unsuccessful in getting patient to do exercises at home or IT band massage thus far.  Will continue to benefit from lower extremity stretching and strengthening, gait and balance training.   Rehab Potential Good   Clinical Impairments Affecting Rehab Potential Motivated for therapy; wife supportive   PT Frequency 2x / week   PT Duration 8 weeks  eval week is week 1   PT Treatment/Interventions ADLs/Self Care Home Management;Moist Heat;Gait training;Neuromuscular re-education;Balance training;Therapeutic exercise;Therapeutic activities;Functional mobility training;Patient/family education;Manual techniques;Passive range of motion   PT Next Visit Plan Continue gait training with RW (or rollator, [place]); continue to address hip tightness/weakness with stretches and exercises and add to HEP   Consulted and Agree with Plan of Care Patient;Family member/caregiver   Family Member Consulted wife present at end of session      Patient will benefit from skilled therapeutic intervention in order to improve the following deficits and impairments:  Abnormal gait, Decreased balance, Decreased mobility, Decreased endurance, Decreased skin integrity, Decreased strength, Difficulty walking, Impaired flexibility  Visit Diagnosis: Stiffness of right hip, not elsewhere classified  Muscle weakness (generalized)  Other abnormalities of gait and mobility     Problem List Patient Active Problem List   Diagnosis Date Noted  . Other fatigue 06/13/2017  . Abnormal diffusion capacity determined by pulmonary function test 06/13/2017  . Chronic diastolic CHF (congestive heart failure) ([place]) 11/01/2016  . Essential hypertension 11/01/2016  . Intracranial vascular stenosis 11/09/2015  . Mild cognitive impairment 11/09/2015  . 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  Benign prostatic hyperplasia with urinary obstruction 03/19/2015  . Awareness alteration, transient 02/26/2015  . PVC's (premature ventricular contractions) 08/22/2013  . Hypothyroidism 05/09/2013  . Stricture and stenosis of esophagus 03/12/2013  . Dysphagia, unspecified(787.20) 02/22/2013  . DOE (dyspnea on exertion) 03/23/2011  . PALPITATIONS 02/12/2010  . CHEST DISCOMFORT 04/25/2009  . PEDAL EDEMA 05/01/2008  . GERD 12/03/2007  . Dyslipidemia 04/05/2007  . BENIGN PROSTATIC HYPERTROPHY 04/05/2007  . Rheumatoid arthritis ([place]) 04/05/2007  . Osteoarthrosis, unspecified whether generalized or localized, unspecified site 04/05/2007  . History of cardiovascular disorder 04/05/2007    Frazier Butt. 06/16/2017, 12:59 PM  Frazier Butt., PT   [facility] [address] [place], [place], [address] Phone: [phone]   Fax:  [phone]  Name: Robert Rowe MRN: [id number] Date of Birth: 1918-04-28

## 2017-06-26 ENCOUNTER — Ambulatory Visit: Payer: Medicare Other | Attending: Internal Medicine | Admitting: Physical Therapy

## 2017-06-26 ENCOUNTER — Encounter: Payer: Self-pay | Admitting: Physical Therapy

## 2017-06-26 DIAGNOSIS — M25651 Stiffness of right hip, not elsewhere classified: Secondary | ICD-10-CM

## 2017-06-26 DIAGNOSIS — R2689 Other abnormalities of gait and mobility: Secondary | ICD-10-CM | POA: Diagnosis not present

## 2017-06-26 DIAGNOSIS — M6281 Muscle weakness (generalized): Secondary | ICD-10-CM | POA: Diagnosis not present

## 2017-06-26 DIAGNOSIS — R2681 Unsteadiness on feet: Secondary | ICD-10-CM | POA: Insufficient documentation

## 2017-06-26 NOTE — Therapy (Signed)
[facility] [address] [place], [place], [address] Phone: [phone]   Fax:  [phone]  Physical Therapy Treatment  Patient Details  Name: Robert Rowe MRN: [id number] Date of Birth: 1918/04/09 Referring Provider: Burnice Logan  Encounter Date: 06/26/2017      PT End of Session - 06/26/17 2046    Visit Number 5   Number of Visits 16   Date for PT Re-Evaluation 07/08/17   Authorization Type [employer] Medicare-GCODE every 10th visit   PT Start Time 1533   PT Stop Time 1626   PT Time Calculation (min) 53 min   Activity Tolerance Patient tolerated treatment well   Behavior During Therapy [facility] for tasks assessed/performed      Past Medical History:  Diagnosis Date  . BENIGN PROSTATIC HYPERTROPHY 04/05/2007  . 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 CHEST DISCOMFORT 04/25/2009  . Chronic diastolic CHF (congestive heart failure) ([place])    a. Echo 12/16: mild LVH, EF 55-60%, no RWMA, Gr 1 DD, mild AI, MAC, mild LAE, normal RVF, PASP 37 mmHg  . Chronic kidney disease    chronic   . DJD (degenerative joint disease)   . Dysrhythmia    pvc's   . GERD 12/03/2007  . Hiatal hernia   . History of nuclear stress test    Myoview 1/17: EF 53%, normal perfusion; Low Risk  . HYPERLIPIDEMIA 04/05/2007  . Hypothyroidism   . Osteoarth NOS-Unspec 04/05/2007  . Palpitations 02/12/2010  . PEDAL EDEMA 05/01/2008  . Rheumatoid arthritis(714.0) 04/05/2007  . Shortness of breath dyspnea   . TRANSIENT ISCHEMIC ATTACK, HX OF 04/05/2007   ?     Past Surgical History:  Procedure Laterality Date  . BALLOON DILATION N/A 03/12/2013   Procedure: BALLOON DILATION;  Surgeon: Inda Castle, MD;  Location: [facility];  Service: Endoscopy;  Laterality: N/A;  . CATARACT EXTRACTION  2001   bilateral  . CYSTOSCOPY WITH URETHRAL DILATATION N/A 09/24/2015   Procedure: CYSTOSCOPY WITH COOK BALLOON DILATATION OF URETHRAL STRICTURE ;   Surgeon: Carolan Clines, MD;  Location: [facility];  Service: Urology;  Laterality: N/A;  . CYSTOSCOPY WITH URETHRAL DILATATION N/A 07/20/2016   Procedure: CYSTOSCOPY WITH URETHRAL DILATATION;  Surgeon: Alexis Frock, MD;  Location: [facility];  Service: Urology;  Laterality: N/A;  1 HOUR  . ESOPHAGOGASTRODUODENOSCOPY N/A 03/12/2013   Procedure: ESOPHAGOGASTRODUODENOSCOPY (EGD);  Surgeon: Inda Castle, MD;  Location: [facility];  Service: Endoscopy;  Laterality: N/A;  . HEMORRHOID SURGERY    . HERNIA REPAIR     inguinal x5  . KNEE SURGERY  arthroscopic   left  . PROSTATE CRYOABLATION     2000  . SHOULDER SURGERY  few years ago   left  . TRANSURETHRAL RESECTION OF PROSTATE N/A 03/19/2015   Procedure: TRANSURETHRAL RESECTION OF THE PROSTATE (TURP);  Surgeon: Carolan Clines, MD;  Location: [facility];  Service: Urology;  Laterality: N/A;    There were no vitals filed for this visit.      Subjective Assessment - 06/26/17 1539    Subjective Hurting all over. Asking to review his HEP as he has a bad memory.    Pertinent History Wound on lateral aspect of RLE (healed from fall 11/04/17; hx of CHF, DJD, OA   Limitations --  lifting leg to put on pants   Patient Stated Goals Pt's goal is to loosen up R hip and walk better.   Currently in Pain? Yes  Pain Score 4    Pain Location --  all over--can't specify   Pain Descriptors / Indicators Aching   Pain Onset Today   Pain Frequency Occasional                         [facility] Adult PT Treatment/Exercise - 06/26/17 0001      Bed Mobility   Bed Mobility Supine to Sit;Sit to Supine   Supine to Sit 5: Supervision   Supine to Sit Details (indicate cue type and reason) incr time/effort   Sit to Supine 6: Modified independent (Device/Increase time)     Ambulation/Gait   Ambulation/Gait Assistance 5: Supervision   Ambulation/Gait Assistance Details vc for upright posture   Ambulation Distance (Feet) 100 Feet   Assistive  device None   Gait Pattern Right foot flat;Left foot flat;Right flexed knee in stance;Left flexed knee in stance;Lateral hip instability;Wide base of support   Ambulation Surface Level     Knee/Hip Exercises: Stretches   Active Hamstring Stretch Right;Left;3 reps;30 seconds   Active Hamstring Stretch Limitations Review of HEP-PT provides cues for technique     Knee/Hip Exercises: Seated   Clamshell with TheraBand Red  x 3; poor technique, changed to sidelying   Other Seated Knee/Hip Exercises Seated marching, 10 reps; then seated external rotation stretch 3 x 30 seconds each leg.     Knee/Hip Exercises: Supine   Heel Slides AROM;Right;Left;1 set;10 reps   Other Supine Knee/Hip Exercises single knee to chest x30 sec each leg x 2 reps     Knee/Hip Exercises: Sidelying   Clams Lt sidelying, lifting RLE against red band                PT Education - 06/26/17 2045    Education provided Yes   Education Details provided another handout and went through HEP step-by-step as he reports he doesn't understand the handouts he has at home   Person(s) Educated Spouse;Patient  wife arrived at end of session and educated on exercises   Methods Explanation;Demonstration;Tactile cues;Verbal cues;Handout   Comprehension Verbalized understanding;Returned demonstration;Verbal cues required;Tactile cues required;Need further instruction          PT Short Term Goals - 06/08/17 2210      PT SHORT TERM GOAL #1   Title Pt will perform HEP with wife's supervision, for imrpoved flexibility, strength, balance and gait.  TARGET 07/08/17   Time 4   Period Weeks   Status New     PT SHORT TERM GOAL #2   Title Pt will improve TUG score to less than or equal to 15 seconds for decreased fall risk.   Time 4   Period Weeks   Status New     PT SHORT TERM GOAL #3   Title Pt will ambulate at least 250 ft, using least restrictive assistive device, modified independently,f or improved safety and  efficiency with gait.   Time 4   Period Weeks   Status New     PT SHORT TERM GOAL #4   Title Berg Balance test to be assessed, with pt scoring additional 5 points from initial score for imrpoved balance/decreased fall risk.   Time 4   Period Weeks   Status New           PT Long Term Goals - 06/08/17 2212      PT LONG TERM GOAL #1   Title Pt will verbalize understanding of fall prevention in home environment.  TARGET  08/08/17   Time 8   Period Weeks   Status New     PT LONG TERM GOAL #2   Title Pt will improve TUG score to less than or equal to 13.5 seconds for decreased fall risk.   Time 8   Status New     PT LONG TERM GOAL #3   Title Pt will improve gait velocity to at least 2.8 ft/sec for improved gait efficiency and safety.   Time 8   Period Weeks   Status New     PT LONG TERM GOAL #4   Title Pt will demonstrate improved hip ROM by reporting 50% improved ability to put on and off pants and socks.   Time 8   Period Weeks   Status New     PT LONG TERM GOAL #5   Title Pt will ambulate at least 800 ft using least restrictive assistive device, modified independently.   Time 8   Period Weeks   Status New               Plan - 06/26/17 2047    Clinical Impression Statement Patient requesting to again review his HEP and to educate his wife as he has difficulty understanding handouts when he gets home and tries to do the exercises. Patient required hands-on assist and verbal instructions for correct technique. Patient refused to work on walking with RW or rollator today. He states he does not use the rollator at his [place] because it leaves Zou on the floors. Will continue to address fall prevention and appropriate use of an assistive device.    Rehab Potential Good   Clinical Impairments Affecting Rehab Potential Motivated for therapy; wife supportive   PT Frequency 2x / week   PT Duration 8 weeks  eval week is week 1   PT Treatment/Interventions  ADLs/Self Care Home Management;Moist Heat;Gait training;Neuromuscular re-education;Balance training;Therapeutic exercise;Therapeutic activities;Functional mobility training;Patient/family education;Manual techniques;Passive range of motion   PT Next Visit Plan Continue gait training with RW vs rollator vs anything pt will agree to use; continue to address hip tightness/weakness with stretches and exercises    Consulted and Agree with Plan of Care Patient;Family member/caregiver   Family Member Consulted wife present at end of session      Patient will benefit from skilled therapeutic intervention in order to improve the following deficits and impairments:  Abnormal gait, Decreased balance, Decreased mobility, Decreased endurance, Decreased skin integrity, Decreased strength, Difficulty walking, Impaired flexibility  Visit Diagnosis: Stiffness of right hip, not elsewhere classified  Muscle weakness (generalized)  Other abnormalities of gait and mobility     Problem List Patient Active Problem List   Diagnosis Date Noted  . Other fatigue 06/13/2017  . Abnormal diffusion capacity determined by pulmonary function test 06/13/2017  . Chronic diastolic CHF (congestive heart failure) ([place]) 11/01/2016  . Essential hypertension 11/01/2016  . Intracranial vascular stenosis 11/09/2015  . Mild cognitive impairment 11/09/2015  . 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 Benign prostatic hyperplasia with urinary obstruction 03/19/2015  . Awareness alteration, transient 02/26/2015  . PVC's (premature ventricular contractions) 08/22/2013  . Hypothyroidism 05/09/2013  . Stricture and stenosis of esophagus 03/12/2013  . Dysphagia, unspecified(787.20) 02/22/2013  . DOE (dyspnea on exertion) 03/23/2011  . PALPITATIONS 02/12/2010  . CHEST DISCOMFORT 04/25/2009  . PEDAL EDEMA 05/01/2008  . GERD 12/03/2007  . Dyslipidemia 04/05/2007  . BENIGN PROSTATIC HYPERTROPHY 04/05/2007  . Rheumatoid  arthritis ([place]) 04/05/2007  . Osteoarthrosis, unspecified whether generalized or localized,  unspecified site 04/05/2007  . History of cardiovascular disorder 04/05/2007    Rexanne Mano, PT 06/26/2017, 8:52 PM  [facility] [address] [place], [place], [address] Phone: [phone]   Fax:  [phone]  Name: JEREMAINE MARAJ MRN: [id number] Date of Birth: 02-15-1918

## 2017-06-26 NOTE — Patient Instructions (Signed)
Clam Shell 45 Degrees    Lying with hips and knees bent 45, one pillow between knees and ankles. Put your red band around both legs above your knees. Lift top knee up towards the ceiling. Be sure pelvis does not roll backward.  Do 10 times, each leg, _1__ times per day.  http://ss.exer.us/74   Copyright  VHI. All rights reserved.

## 2017-06-29 ENCOUNTER — Ambulatory Visit: Payer: Medicare Other | Admitting: Physical Therapy

## 2017-06-29 ENCOUNTER — Encounter: Payer: Self-pay | Admitting: Physical Therapy

## 2017-06-29 VITALS — BP 126/70 | HR 90

## 2017-06-29 DIAGNOSIS — M25651 Stiffness of right hip, not elsewhere classified: Secondary | ICD-10-CM | POA: Diagnosis not present

## 2017-06-29 DIAGNOSIS — R2681 Unsteadiness on feet: Secondary | ICD-10-CM | POA: Diagnosis not present

## 2017-06-29 DIAGNOSIS — M6281 Muscle weakness (generalized): Secondary | ICD-10-CM | POA: Diagnosis not present

## 2017-06-29 DIAGNOSIS — R2689 Other abnormalities of gait and mobility: Secondary | ICD-10-CM | POA: Diagnosis not present

## 2017-06-29 NOTE — Therapy (Signed)
[facility] [address] [place], [place], [address] Phone: [phone]   Fax:  [phone]  Physical Therapy Treatment  Patient Details  Name: Robert Rowe MRN: [id number] Date of Birth: [birth year] Referring Provider: Burnice Logan  Encounter Date: 06/29/2017      PT End of Session - 06/29/17 1951    Visit Number 6   Number of Visits 16   Date for PT Re-Evaluation 07/08/17   Authorization Type UHC Medicare-GCODE every 10th visit   PT Start Time 1506  delayed start due to pt eating to raise blood sugar   PT Stop Time 1529   PT Time Calculation (min) 23 min   Activity Tolerance Patient tolerated treatment well   Behavior During Therapy [facility] for tasks assessed/performed      Past Medical History:  Diagnosis Date  . BENIGN PROSTATIC HYPERTROPHY 04/05/2007  . 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 CHEST DISCOMFORT 04/25/2009  . Chronic diastolic CHF (congestive heart failure) ([place])    a. Echo 12/16: mild LVH, EF 55-60%, no RWMA, Gr 1 DD, mild AI, MAC, mild LAE, normal RVF, PASP 37 mmHg  . Chronic kidney disease    chronic   . DJD (degenerative joint disease)   . Dysrhythmia    pvc's   . GERD 12/03/2007  . Hiatal hernia   . History of nuclear stress test    Myoview 1/17: EF 53%, normal perfusion; Low Risk  . HYPERLIPIDEMIA 04/05/2007  . Hypothyroidism   . Osteoarth NOS-Unspec 04/05/2007  . Palpitations 02/12/2010  . PEDAL EDEMA 05/01/2008  . Rheumatoid arthritis(714.0) 04/05/2007  . Shortness of breath dyspnea   . TRANSIENT ISCHEMIC ATTACK, HX OF 04/05/2007   ?     Past Surgical History:  Procedure Laterality Date  . BALLOON DILATION N/A 03/12/2013   Procedure: BALLOON DILATION;  Surgeon: Inda Castle, MD;  Location: [facility];  Service: Endoscopy;  Laterality: N/A;  . CATARACT EXTRACTION  2001   bilateral  . CYSTOSCOPY WITH URETHRAL DILATATION N/A 09/24/2015   Procedure: CYSTOSCOPY  WITH COOK BALLOON DILATATION OF URETHRAL STRICTURE ;  Surgeon: Carolan Clines, MD;  Location[facility];  Service: Urology;  Laterality: N/A;  . CYSTOSCOPY WITH URETHRAL DILATATION N/A 07/20/2016   Procedure: CYSTOSCOPY WITH URETHRAL DILATATION;  Surgeon: Alexis Frock, MD;  Location: [facility];  Service: Urology;  Laterality: N/A;  1 HOUR  . ESOPHAGOGASTRODUODENOSCOPY N/A 03/12/2013   Procedure: ESOPHAGOGASTRODUODENOSCOPY (EGD);  Surgeon: Inda Castle, MD;  Location: [facility];  Service: Endoscopy;  Laterality: N/A;  . HEMORRHOID SURGERY    . HERNIA REPAIR     inguinal x5  . KNEE SURGERY  arthroscopic   left  . PROSTATE CRYOABLATION     2000  . SHOULDER SURGERY  few years ago   left  . TRANSURETHRAL RESECTION OF PROSTATE N/A 03/19/2015   Procedure: TRANSURETHRAL RESECTION OF THE PROSTATE (TURP);  Surgeon: Carolan Clines, MD;  Location: [facility];  Service: Urology;  Laterality: N/A;    Vitals:   06/29/17 1452  BP: 126/70  Pulse: 90        Subjective Assessment - 06/29/17 1444    Subjective My blood sugar is low. I have to have some candy or crackers. Reports his blood sugar has been dropping more frequently lately.    Pertinent History Wound on lateral aspect of RLE (healed from fall 11/04/17; hx of CHF, DJD, OA   Limitations --  lifting leg to put  on pants   Patient Stated Goals Pt's goal is to loosen up R hip and walk better.   Currently in Pain? No/denies   Pain Onset Today                         [facility] Adult PT Treatment/Exercise - 06/29/17 0001      Ambulation/Gait   Ambulation/Gait Yes   Ambulation/Gait Assistance 5: Supervision   Ambulation/Gait Assistance Details vc for upright posture; Rt stride length improved after stretches and pt reported "it feels easier to walk now"   Ambulation Distance (Feet) 50 Feet  120   Assistive device None   Gait Pattern Right foot flat;Left foot flat;Right flexed knee in stance;Left flexed knee in  stance;Lateral hip instability;Wide base of support   Ambulation Surface Level;Indoor     Knee/Hip Exercises: Hydrologist Both;2 reps;30 seconds   Hip Flexor Stretch Right;2 reps;60 seconds   Hip Flexor Stretch Limitations supine, lt knee/hip in flexion   Piriformis Stretch Both;2 reps;30 seconds   Other Knee/Hip Stretches supine hip internal and external rotation with Rt hip significantly more limited than his left     Knee/Hip Exercises: Supine   Short Arc Quad Sets Strengthening;Right;1 set;10 reps  manually resisted   Heel Slides AROM;Strengthening;Right;1 set  resisted flexion and extension                PT Education - 06/29/17 1957    Education provided Yes   Education Details Educated on potential benefits of using RW vs rollator (to decrease hip pain, ?back pain)   Person(s) Educated Patient   Methods Explanation   Comprehension Verbalized understanding;Need further instruction          PT Short Term Goals - 06/08/17 2210      PT SHORT TERM GOAL #1   Title Pt will perform HEP with wife's supervision, for imrpoved flexibility, strength, balance and gait.  TARGET 07/08/17   Time 4   Period Weeks   Status New     PT SHORT TERM GOAL #2   Title Pt will improve TUG score to less than or equal to 15 seconds for decreased fall risk.   Time 4   Period Weeks   Status New     PT SHORT TERM GOAL #3   Title Pt will ambulate at least 250 ft, using least restrictive assistive device, modified independently,f or improved safety and efficiency with gait.   Time 4   Period Weeks   Status New     PT SHORT TERM GOAL #4   Title Berg Balance test to be assessed, with pt scoring additional 5 points from initial score for imrpoved balance/decreased fall risk.   Time 4   Period Weeks   Status New           PT Long Term Goals - 06/08/17 2212      PT LONG TERM GOAL #1   Title Pt will verbalize understanding of fall prevention in home  environment.  TARGET 08/08/17   Time 8   Period Weeks   Status New     PT LONG TERM GOAL #2   Title Pt will improve TUG score to less than or equal to 13.5 seconds for decreased fall risk.   Time 8   Status New     PT LONG TERM GOAL #3   Title Pt will improve gait velocity to at least 2.8 ft/sec for improved gait  efficiency and safety.   Time 8   Period Weeks   Status New     PT LONG TERM GOAL #4   Title Pt will demonstrate improved hip ROM by reporting 50% improved ability to put on and off pants and socks.   Time 8   Period Weeks   Status New     PT LONG TERM GOAL #5   Title Pt will ambulate at least 800 ft using least restrictive assistive device, modified independently.   Time 8   Period Weeks   Status New               Plan - 06/29/17 1952    Clinical Impression Statement Session shortened and modified due to pt immediately reporting low blood sugar on arrival to clinic. Assisted to sit at table and BP checked (WNL). Provided pack of peanut butter crackers which pt consumed. Began to feel better and wanted to complete session--I chose to address Rt hip pain via stretching. Patient felt his ambulation was "easier" after completing PROM stretches and ther-ex. Continue to work towards goals.    Rehab Potential Good   Clinical Impairments Affecting Rehab Potential Motivated for therapy; wife supportive   PT Frequency 2x / week   PT Duration 8 weeks  eval week is week 1   PT Treatment/Interventions ADLs/Self Care Home Management;Moist Heat;Gait training;Neuromuscular re-education;Balance training;Therapeutic exercise;Therapeutic activities;Functional mobility training;Patient/family education;Manual techniques;Passive range of motion   PT Next Visit Plan Continue gait training with RW vs rollator vs anything pt will agree to use; continue to address hip tightness/weakness with stretches and exercises    Consulted and Agree with Plan of Care Patient;Family member/caregiver    Family Member Consulted wife present at end of session      Patient will benefit from skilled therapeutic intervention in order to improve the following deficits and impairments:  Abnormal gait, Decreased balance, Decreased mobility, Decreased endurance, Decreased skin integrity, Decreased strength, Difficulty walking, Impaired flexibility  Visit Diagnosis: Stiffness of right hip, not elsewhere classified  Muscle weakness (generalized)  Other abnormalities of gait and mobility     Problem List Patient Active Problem List   Diagnosis Date Noted  . Other fatigue 06/13/2017  . Abnormal diffusion capacity determined by pulmonary function test 06/13/2017  . Chronic diastolic CHF (congestive heart failure) ([place]) 11/01/2016  . Essential hypertension 11/01/2016  . Intracranial vascular stenosis 11/09/2015  . Mild cognitive impairment 11/09/2015  . 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 Benign prostatic hyperplasia with urinary obstruction 03/19/2015  . Awareness alteration, transient 02/26/2015  . PVC's (premature ventricular contractions) 08/22/2013  . Hypothyroidism 05/09/2013  . Stricture and stenosis of esophagus 03/12/2013  . Dysphagia, unspecified(787.20) 02/22/2013  . DOE (dyspnea on exertion) 03/23/2011  . PALPITATIONS 02/12/2010  . CHEST DISCOMFORT 04/25/2009  . PEDAL EDEMA 05/01/2008  . GERD 12/03/2007  . Dyslipidemia 04/05/2007  . BENIGN PROSTATIC HYPERTROPHY 04/05/2007  . Rheumatoid arthritis ([place]) 04/05/2007  . Osteoarthrosis, unspecified whether generalized or localized, unspecified site 04/05/2007  . History of cardiovascular disorder 04/05/2007    Rexanne Mano, PT 06/29/2017, 7:58 PM  [facility] [address] [place], [place], [address] Phone: [phone]   Fax:  [phone]  Name: Robert Rowe MRN: [id number] Date of Birth: [birth year]

## 2017-07-03 ENCOUNTER — Ambulatory Visit: Payer: Medicare Other | Admitting: Physical Therapy

## 2017-07-05 ENCOUNTER — Ambulatory Visit: Payer: Medicare Other | Admitting: Physical Therapy

## 2017-07-05 VITALS — BP 111/71

## 2017-07-05 DIAGNOSIS — M6281 Muscle weakness (generalized): Secondary | ICD-10-CM | POA: Diagnosis not present

## 2017-07-05 DIAGNOSIS — R2689 Other abnormalities of gait and mobility: Secondary | ICD-10-CM | POA: Diagnosis not present

## 2017-07-05 DIAGNOSIS — M25651 Stiffness of right hip, not elsewhere classified: Secondary | ICD-10-CM | POA: Diagnosis not present

## 2017-07-05 DIAGNOSIS — R2681 Unsteadiness on feet: Secondary | ICD-10-CM | POA: Diagnosis not present

## 2017-07-06 NOTE — Therapy (Signed)
[facility] [address] [place], [place], [address] Phone: [phone]   Fax:  [phone]  Physical Therapy Treatment  Patient Details  Name: Robert Rowe MRN: [id number] Date of Birth: 11-02-1918 Referring Provider: Burnice Logan  Encounter Date: 07/05/2017      PT End of Session - 07/06/17 0725    Visit Number 7   Number of Visits 16   Date for PT Re-Evaluation 07/08/17   Authorization Type UHC Medicare-GCODE every 10th visit   PT Start Time 1104   PT Stop Time 1146   PT Time Calculation (min) 42 min   Activity Tolerance Patient tolerated treatment well;Patient limited by fatigue  gait distance limited by fatigue   Behavior During Therapy [facility] for tasks assessed/performed      Past Medical History:  Diagnosis Date  . BENIGN PROSTATIC HYPERTROPHY 04/05/2007  . 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 CHEST DISCOMFORT 04/25/2009  . Chronic diastolic CHF (congestive heart failure) ([place])    a. Echo 12/16: mild LVH, EF 55-60%, no RWMA, Gr 1 DD, mild AI, MAC, mild LAE, normal RVF, PASP 37 mmHg  . Chronic kidney disease    chronic   . DJD (degenerative joint disease)   . Dysrhythmia    pvc's   . GERD 12/03/2007  . Hiatal hernia   . History of nuclear stress test    Myoview 1/17: EF 53%, normal perfusion; Low Risk  . HYPERLIPIDEMIA 04/05/2007  . Hypothyroidism   . Osteoarth NOS-Unspec 04/05/2007  . Palpitations 02/12/2010  . PEDAL EDEMA 05/01/2008  . Rheumatoid arthritis(714.0) 04/05/2007  . Shortness of breath dyspnea   . TRANSIENT ISCHEMIC ATTACK, HX OF 04/05/2007   ?     Past Surgical History:  Procedure Laterality Date  . BALLOON DILATION N/A 03/12/2013   Procedure: BALLOON DILATION;  Surgeon: Inda Castle, MD;  Location: [facility];  Service: Endoscopy;  Laterality: N/A;  . CATARACT EXTRACTION  2001   bilateral  . CYSTOSCOPY WITH URETHRAL DILATATION N/A 09/24/2015   Procedure:  CYSTOSCOPY WITH COOK BALLOON DILATATION OF URETHRAL STRICTURE ;  Surgeon: Carolan Clines, MD;  [facility];  Service: Urology;  Laterality: N/A;  . CYSTOSCOPY WITH URETHRAL DILATATION N/A 07/20/2016   Procedure: CYSTOSCOPY WITH URETHRAL DILATATION;  Surgeon: Alexis Frock, MD;  [facility];  Service: Urology;  Laterality: N/A;  1 HOUR  . ESOPHAGOGASTRODUODENOSCOPY N/A 03/12/2013   Procedure: ESOPHAGOGASTRODUODENOSCOPY (EGD);  Surgeon: Inda Castle, MD;  Location: [facility];  Service: Endoscopy;  Laterality: N/A;  . HEMORRHOID SURGERY    . HERNIA REPAIR     inguinal x5  . KNEE SURGERY  arthroscopic   left  . PROSTATE CRYOABLATION     2000  . SHOULDER SURGERY  few years ago   left  . TRANSURETHRAL RESECTION OF PROSTATE N/A 03/19/2015   Procedure: TRANSURETHRAL RESECTION OF THE PROSTATE (TURP);  Surgeon: Carolan Clines, MD;  [facility];  Service: Urology;  Laterality: N/A;    Vitals:   07/05/17 1105  BP: 111/71  SpO2: 99%        Subjective Assessment - 07/05/17 1107    Subjective Feel just fair today.  Not using the walker really; probably just lazy.   Pertinent History Wound on lateral aspect of RLE (healed from fall 11/04/17; hx of CHF, DJD, OA   Limitations --  lifting leg to put on pants   Patient Stated Goals Pt's goal is to loosen up  R hip and walk better.   Currently in Pain? No/denies   Pain Onset Today                         [facility] Adult PT Treatment/Exercise - 07/06/17 0717      Ambulation/Gait   Ambulation/Gait Yes   Ambulation/Gait Assistance 5: Supervision   Ambulation/Gait Assistance Details Cues to slow pace of gait and for upright posture   Ambulation Distance (Feet) 80 Feet  120 x 2, 100 ft   Assistive device None;Rolling walker   Gait Pattern Right foot flat;Left foot flat;Right flexed knee in stance;Left flexed knee in stance;Lateral hip instability;Wide base of support   Ambulation Surface Level;Indoor   Gait  Comments Gait training with and without device.  Pt needs cues for slowed pace without device, and fatigues after 100 ft.  Gait with RW x 120 ft, with cues to slow pace and cues to stay within BOS of RW.     Standardized Balance Assessment   Standardized Balance Assessment Berg Balance Test;Timed Up and Go Test     Berg Balance Test   Sit to Stand Able to stand  independently using hands   Standing Unsupported Able to stand safely 2 minutes   Sitting with Back Unsupported but Feet Supported on Floor or Stool Able to sit safely and securely 2 minutes   Stand to Sit Controls descent by using hands   Transfers Able to transfer safely, minor use of hands   Standing Unsupported with Eyes Closed Able to stand 10 seconds with supervision   Standing Ubsupported with Feet Together Able to place feet together independently and stand 1 minute safely   From Standing, Reach Forward with Outstretched Arm Can reach forward >12 cm safely (5")   From Standing Position, Pick up Object from Floor Able to pick up shoe, needs supervision   From Standing Position, Turn to Look Behind Over each Shoulder Looks behind from both sides and weight shifts well   Turn 360 Degrees Able to turn 360 degrees safely but slowly   Standing Unsupported, Alternately Place Feet on Step/Stool Able to complete >2 steps/needs minimal assist   Standing Unsupported, One Foot in Front Able to take small step independently and hold 30 seconds   Standing on One Leg Unable to try or needs assist to prevent fall   Total Score 40     Timed Up and Go Test   TUG Normal TUG   Normal TUG (seconds) 11.68     Knee/Hip Exercises: Stretches   Passive Hamstring Stretch Right;Left;3 reps;30 seconds  supin   Passive Hamstring Stretch Limitations Utilized contract-relax technique   Hip Flexor Stretch Right;3 reps;60 seconds   Hip Flexor Stretch Limitations supine, lt knee/hip in flexion   Piriformis Stretch Both;2 reps;30 seconds   Other  Knee/Hip Stretches supine hip internal and external rotation with Rt hip significantly more limited than his left     Knee/Hip Exercises: Supine   Short Arc Quad Sets Strengthening;Right;1 set;10 reps   Heel Slides AROM;Strengthening;Right;1 set                PT Education - 07/06/17 0724    Education provided Yes   Education Details Educated on results of Berg and TUG and continued to educate on use of RW to help with pain and endurance for gait; discussed POC-plan to continue additional 3 weeks towards LTGs   Person(s) Educated Patient   Methods Explanation  Comprehension Verbalized understanding          PT Short Term Goals - 07/05/17 1124      PT SHORT TERM GOAL #1   Title Pt will perform HEP with wife's supervision, for imrpoved flexibility, strength, balance and gait.  TARGET 07/08/17   Time 4   Period Weeks   Status On-going     PT SHORT TERM GOAL #2   Title Pt will improve TUG score to less than or equal to 15 seconds for decreased fall risk.   Time 4   Period Weeks   Status Achieved     PT SHORT TERM GOAL #3   Title Pt will ambulate at least 250 ft, using least restrictive assistive device, modified independently,f or improved safety and efficiency with gait.   Time 4   Period Weeks   Status Not Met     PT SHORT TERM GOAL #4   Title Berg Balance test to be assessed, with pt scoring additional 5 points from initial score for imrpoved balance/decreased fall risk.   Baseline improved to 40/56 from 36/56   Time 4   Period Weeks   Status Not Met           PT Long Term Goals - 06/08/17 2212      PT LONG TERM GOAL #1   Title Pt will verbalize understanding of fall prevention in home environment.  TARGET 08/08/17   Time 8   Period Weeks   Status New     PT LONG TERM GOAL #2   Title Pt will improve TUG score to less than or equal to 13.5 seconds for decreased fall risk.   Time 8   Status New     PT LONG TERM GOAL #3   Title Pt will improve gait  velocity to at least 2.8 ft/sec for improved gait efficiency and safety.   Time 8   Period Weeks   Status New     PT LONG TERM GOAL #4   Title Pt will demonstrate improved hip ROM by reporting 50% improved ability to put on and off pants and socks.   Time 8   Period Weeks   Status New     PT LONG TERM GOAL #5   Title Pt will ambulate at least 800 ft using least restrictive assistive device, modified independently.   Time 8   Period Weeks   Status New               Plan - 07/06/17 0725    Clinical Impression Statement STGs assessed today, with STG 1 partially met; STG 2 met for improved TUG score; STG 3 and 4 not met, though Berg score has improved by 4 point to 40/56.  Discussed therapist recommendation to use rolling walker (at home) or rollator([place]) to assist with stability and endurance with gait; however, pt presents to therapy with no device and reports he only walks "short" distances and doesn't feel he needs it.  Overall, pt does report improved ability to put on pants, noting improvement in hip function.  Will continue to address stretches and strenghtening.   Rehab Potential Good   Clinical Impairments Affecting Rehab Potential Motivated for therapy; wife supportive   PT Frequency 2x / week   PT Duration 8 weeks  eval week is week 1   PT Treatment/Interventions ADLs/Self Care Home Management;Moist Heat;Gait training;Neuromuscular re-education;Balance training;Therapeutic exercise;Therapeutic activities;Functional mobility training;Patient/family education;Manual techniques;Passive range of motion   PT Next Visit Plan  continue to work on hip tightnesss/weakness-try standing stretches at counter?; revisit gait training with RW vs rollator.   Consulted and Agree with Plan of Care Patient   Family Member Consulted --      Patient will benefit from skilled therapeutic intervention in order to improve the following deficits and impairments:  Abnormal gait,  Decreased balance, Decreased mobility, Decreased endurance, Decreased skin integrity, Decreased strength, Difficulty walking, Impaired flexibility  Visit Diagnosis: Unsteadiness on feet  Stiffness of right hip, not elsewhere classified  Other abnormalities of gait and mobility     Problem List Patient Active Problem List   Diagnosis Date Noted  . Other fatigue 06/13/2017  . Abnormal diffusion capacity determined by pulmonary function test 06/13/2017  . Chronic diastolic CHF (congestive heart failure) ([place]) 11/01/2016  . Essential hypertension 11/01/2016  . Intracranial vascular stenosis 11/09/2015  . Mild cognitive impairment 11/09/2015  . 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 Benign prostatic hyperplasia with urinary obstruction 03/19/2015  . Awareness alteration, transient 02/26/2015  . PVC's (premature ventricular contractions) 08/22/2013  . Hypothyroidism 05/09/2013  . Stricture and stenosis of esophagus 03/12/2013  . Dysphagia, unspecified(787.20) 02/22/2013  . DOE (dyspnea on exertion) 03/23/2011  . PALPITATIONS 02/12/2010  . CHEST DISCOMFORT 04/25/2009  . PEDAL EDEMA 05/01/2008  . GERD 12/03/2007  . Dyslipidemia 04/05/2007  . BENIGN PROSTATIC HYPERTROPHY 04/05/2007  . Rheumatoid arthritis ([place]) 04/05/2007  . Osteoarthrosis, unspecified whether generalized or localized, unspecified site 04/05/2007  . History of cardiovascular disorder 04/05/2007    Frazier Butt. 07/06/2017, 7:30 AM  Frazier Butt., PT   [facility] [address] [place], [place], [address] Phone: [phone]   Fax:  [phone]  Name: Robert Rowe MRN: [id number] Date of Birth: [birth year]

## 2017-07-11 ENCOUNTER — Other Ambulatory Visit: Payer: Self-pay | Admitting: Family Medicine

## 2017-07-18 ENCOUNTER — Ambulatory Visit: Payer: Medicare Other | Admitting: Physical Therapy

## 2017-07-18 DIAGNOSIS — M6281 Muscle weakness (generalized): Secondary | ICD-10-CM | POA: Diagnosis not present

## 2017-07-18 DIAGNOSIS — R2689 Other abnormalities of gait and mobility: Secondary | ICD-10-CM | POA: Diagnosis not present

## 2017-07-18 DIAGNOSIS — R2681 Unsteadiness on feet: Secondary | ICD-10-CM | POA: Diagnosis not present

## 2017-07-18 DIAGNOSIS — M25651 Stiffness of right hip, not elsewhere classified: Secondary | ICD-10-CM | POA: Diagnosis not present

## 2017-07-18 NOTE — Therapy (Signed)
[facility] [address] [place], [place], [address] Phone: [phone]   Fax:  [phone]  Physical Therapy Treatment  Patient Details  Name: Robert Rowe MRN: [id number] Date of Birth: Sep 10, 1918 Referring Provider: Burnice Logan  Encounter Date: 07/18/2017      PT End of Session - 07/18/17 2138    Visit Number 8   Number of Visits 16   Date for PT Re-Evaluation 07/08/17   Authorization Type [employer]HC Medicare-GCODE every 10th visit   PT Start Time 1403   PT Stop Time 1436  Ended session early-goals checked and discharged   PT Time Calculation (min) 33 min   Activity Tolerance Patient tolerated treatment well  gait distance limited by fatigue   Behavior During Therapy [facility] for tasks assessed/performed      Past Medical History:  Diagnosis Date  . BENIGN PROSTATIC HYPERTROPHY 04/05/2007  . 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 CHEST DISCOMFORT 04/25/2009  . Chronic diastolic CHF (congestive heart failure) ([place])    a. Echo 12/16: mild LVH, EF 55-60%, no RWMA, Gr 1 DD, mild AI, MAC, mild LAE, normal RVF, PASP 37 mmHg  . Chronic kidney disease    chronic   . DJD (degenerative joint disease)   . Dysrhythmia    pvc's   . GERD 12/03/2007  . Hiatal hernia   . History of nuclear stress test    Myoview 1/17: EF 53%, normal perfusion; Low Risk  . HYPERLIPIDEMIA 04/05/2007  . Hypothyroidism   . Osteoarth NOS-Unspec 04/05/2007  . Palpitations 02/12/2010  . PEDAL EDEMA 05/01/2008  . Rheumatoid arthritis(714.0) 04/05/2007  . Shortness of breath dyspnea   . TRANSIENT ISCHEMIC ATTACK, HX OF 04/05/2007   ?     Past Surgical History:  Procedure Laterality Date  . BALLOON DILATION N/A 03/12/2013   Procedure: BALLOON DILATION;  Surgeon: Inda Castle, MD;  Location: [facility];  Service: Endoscopy;  Laterality: N/A;  . CATARACT EXTRACTION  2001   bilateral  . CYSTOSCOPY WITH URETHRAL DILATATION N/A  09/24/2015   Procedure: CYSTOSCOPY WITH COOK BALLOON DILATATION OF URETHRAL STRICTURE ;  Surgeon: Carolan Clines, MD;  [facility];  Service: Urology;  Laterality: N/A;  . CYSTOSCOPY WITH URETHRAL DILATATION N/A 07/20/2016   Procedure: CYSTOSCOPY WITH URETHRAL DILATATION;  Surgeon: Alexis Frock, MD;  [facility];  Service: Urology;  Laterality: N/A;  1 HOUR  . ESOPHAGOGASTRODUODENOSCOPY N/A 03/12/2013   Procedure: ESOPHAGOGASTRODUODENOSCOPY (EGD);  Surgeon: Inda Castle, MD;  Location: [facility];  Service: Endoscopy;  Laterality: N/A;  . HEMORRHOID SURGERY    . HERNIA REPAIR     inguinal x5  . KNEE SURGERY  arthroscopic   left  . PROSTATE CRYOABLATION     2000  . SHOULDER SURGERY  few years ago   left  . TRANSURETHRAL RESECTION OF PROSTATE N/A 03/19/2015   Procedure: TRANSURETHRAL RESECTION OF THE PROSTATE (TURP);  Surgeon: Carolan Clines, MD;  [facility];  Service: Urology;  Laterality: N/A;    There were no vitals filed for this visit.      Subjective Assessment - 07/18/17 1409    Subjective Not having any problems with stiffness.  Able to pull my leg up better to put pants on.  I think we've done all we need to do for PT.   Pertinent History Wound on lateral aspect of RLE (healed from fall 11/04/17; hx of CHF, DJD, OA   Limitations --  lifting  leg to put on pants   Patient Stated Goals Pt's goal is to loosen up R hip and walk better.   Currently in Pain? No/denies      After gait, pt's O2 sats 89%>94%.                   [facility] Adult PT Treatment/Exercise - 07/18/17 0001      Ambulation/Gait   Ambulation/Gait Yes   Ambulation/Gait Assistance 5: Supervision   Ambulation Distance (Feet) 200 Feet   Assistive device None   Gait Pattern Right foot flat;Left foot flat;Right flexed knee in stance;Left flexed knee in stance;Lateral hip instability;Wide base of support   Ambulation Surface Level;Indoor   Gait velocity - backwards 2.8  ft/sec     Timed Up and Go Test   TUG Normal TUG   Normal TUG (seconds) 10.88     Self-Care   Self-Care Other Self-Care Comments   Other Self-Care Comments  Discussed pt's progress with goals.  He is pleased with progress, as he is now able to lift RLE up to perform putting on pants and socks without difficulty or limitation.  Discussed fall prevention, plans for d/c.     Exercises   Exercises Other Exercises   Other Exercises  Reviewed all exercises given as part of HEP:  seated hamstring stretch, seated marching, seated hip external rotation stretch; supine hooklying marching, supine single knee to chest, and sidelying hip clamshell abduction resisted with red theraband.  Pt needs verbal cues and picture cues for HEP performance.  PT reiterated the importance of continuing to perform HEP even beyond therapy d/c.                PT Education - 07/18/17 2136    Education provided Yes   Education Details Fall prevention education provided.  Discussed progress towards goals and plans for d/c, as pt requests d/c-is satisfied that his goals are met   Person(s) Educated Patient   Methods Explanation   Comprehension Verbalized understanding          PT Short Term Goals - 07/05/17 1124      PT SHORT TERM GOAL #1   Title Pt will perform HEP with wife's supervision, for imrpoved flexibility, strength, balance and gait.  TARGET 07/08/17   Time 4   Period Weeks   Status On-going     PT SHORT TERM GOAL #2   Title Pt will improve TUG score to less than or equal to 15 seconds for decreased fall risk.   Time 4   Period Weeks   Status Achieved     PT SHORT TERM GOAL #3   Title Pt will ambulate at least 250 ft, using least restrictive assistive device, modified independently,f or improved safety and efficiency with gait.   Time 4   Period Weeks   Status Not Met     PT SHORT TERM GOAL #4   Title Berg Balance test to be assessed, with pt scoring additional 5 points from initial  score for imrpoved balance/decreased fall risk.   Baseline improved to 40/56 from 36/56   Time 4   Period Weeks   Status Not Met           PT Long Term Goals - 07/18/17 1410      PT LONG TERM GOAL #1   Title Pt will verbalize understanding of fall prevention in home environment.  TARGET 08/08/17   Time 8   Period Weeks   Status Achieved  PT LONG TERM GOAL #2   Title Pt will improve TUG score to less than or equal to 13.5 seconds for decreased fall risk.   Baseline 10.88 sec 08/13/2017   Time 8   Status Achieved     PT LONG TERM GOAL #3   Title Pt will improve gait velocity to at least 2.8 ft/sec for improved gait efficiency and safety.   Baseline 2.78 ft/sec 08/13/2017   Time 8   Period Weeks   Status Not Met     PT LONG TERM GOAL #4   Title Pt will demonstrate improved hip ROM by reporting 50% improved ability to put on and off pants and socks.   Baseline Pt reports "no problem" anymore.   Time 8   Period Weeks   Status Achieved     PT LONG TERM GOAL #5   Title Pt will ambulate at least 800 ft using least restrictive assistive device, modified independently.   Time 8   Period Weeks   Status Not Met               Plan - August 13, 2017 2140    Clinical Impression Statement Pt returns to PT today after last visit several weeks ago.  Pt reports he is moving his R hip freely with no limitations and feels his PT goals are met and wants to d/c today.  Pt has met LTG 1, 2, 4.  LTG 3 not technically met, but gait velocity improved to 2.78 ft/sec; LTG 5 not met for gait distance, due to pt's SOB and fatigue limiting pt's giat distance.  Pt's goals of improved flexibility in RLE for improved participation in ADLs has been met and pt is appropriate for d/c at this time.   Rehab Potential Good   Clinical Impairments Affecting Rehab Potential Motivated for therapy; wife supportive   PT Frequency 2x / week   PT Duration 8 weeks  eval week is week 1   PT Treatment/Interventions  ADLs/Self Care Home Management;Moist Heat;Gait training;Neuromuscular re-education;Balance training;Therapeutic exercise;Therapeutic activities;Functional mobility training;Patient/family education;Manual techniques;Passive range of motion   PT Next Visit Plan d/c this visit   Consulted and Agree with Plan of Care Patient      Patient will benefit from skilled therapeutic intervention in order to improve the following deficits and impairments:  Abnormal gait, Decreased balance, Decreased mobility, Decreased endurance, Decreased skin integrity, Decreased strength, Difficulty walking, Impaired flexibility  Visit Diagnosis: Muscle weakness (generalized)  Other abnormalities of gait and mobility       G-Codes - 08/13/17 2143    Functional Assessment Tool Used (Outpatient Only) TUG 10.88 sec, gait velocity 2.78 ft/sec, reports >50% improvement in hip flexibility for putting on pants and socks   Functional Limitation Mobility: Walking and moving around   Mobility: Walking and Moving Around Goal Status [phone]) At least 1 percent but less than 20 percent impaired, limited or restricted   Mobility: Walking and Moving Around Discharge Status [id number]) At least 1 percent but less than 20 percent impaired, limited or restricted      Problem List Patient Active Problem List   Diagnosis Date Noted  . Other fatigue 06/13/2017  . Abnormal diffusion capacity determined by pulmonary function test 06/13/2017  . Chronic diastolic CHF (congestive heart failure) ([place]) 11/01/2016  . Essential hypertension 11/01/2016  . Intracranial vascular stenosis 11/09/2015  . Mild cognitive impairment 11/09/2015  . 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 Benign prostatic hyperplasia with urinary obstruction 03/19/2015  . Awareness alteration, transient  02/26/2015  . PVC's (premature ventricular contractions) 08/22/2013  . Hypothyroidism 05/09/2013  . Stricture and stenosis of esophagus 03/12/2013  .  Dysphagia, unspecified(787.20) 02/22/2013  . DOE (dyspnea on exertion) 03/23/2011  . PALPITATIONS 02/12/2010  . CHEST DISCOMFORT 04/25/2009  . PEDAL EDEMA 05/01/2008  . GERD 12/03/2007  . Dyslipidemia 04/05/2007  . BENIGN PROSTATIC HYPERTROPHY 04/05/2007  . Rheumatoid arthritis ([place]) 04/05/2007  . Osteoarthrosis, unspecified whether generalized or localized, unspecified site 04/05/2007  . History of cardiovascular disorder 04/05/2007    Vasil Juhasz W. 07/18/2017, 9:45 PM  Frazier Butt., PT   [facility] [address] [place], [place], [address] Phone: [phone]   Fax:  [phone]  Name: Robert Rowe MRN: [id number] Date of Birth: [birth year]   PHYSICAL THERAPY DISCHARGE SUMMARY  Visits from Start of Care: 8  Current functional level related to goals / functional outcomes: See goals above-pt has met 3 of 5 LTGs   Remaining deficits: Balance, endurance   Education / Equipment: Pt has been educated in HEP, fall prevention.  Plan: Patient agrees to discharge.  Patient goals were partially met. Patient is being discharged due to being pleased with the current functional level.  ?????   Mady Haagensen, PT 07/18/17 9:47 PM Phone: [phone] Fax: [phone]

## 2017-07-18 NOTE — Patient Instructions (Signed)

## 2017-07-25 ENCOUNTER — Ambulatory Visit (INDEPENDENT_AMBULATORY_CARE_PROVIDER_SITE_OTHER): Payer: Medicare Other | Admitting: Internal Medicine

## 2017-07-25 ENCOUNTER — Encounter: Payer: Self-pay | Admitting: Internal Medicine

## 2017-07-25 ENCOUNTER — Ambulatory Visit: Payer: Medicare Other | Admitting: Physical Therapy

## 2017-07-25 VITALS — BP 130/76 | HR 68 | Temp 97.3°F | Ht 62.0 in | Wt 152.8 lb

## 2017-07-25 DIAGNOSIS — R942 Abnormal results of pulmonary function studies: Secondary | ICD-10-CM

## 2017-07-25 DIAGNOSIS — E039 Hypothyroidism, unspecified: Secondary | ICD-10-CM

## 2017-07-25 DIAGNOSIS — Z8679 Personal history of other diseases of the circulatory system: Secondary | ICD-10-CM

## 2017-07-25 DIAGNOSIS — R0609 Other forms of dyspnea: Secondary | ICD-10-CM | POA: Diagnosis not present

## 2017-07-25 DIAGNOSIS — R0600 Dyspnea, unspecified: Secondary | ICD-10-CM

## 2017-07-25 LAB — CBC WITH DIFFERENTIAL/PLATELET
Basophils Absolute: 0.1 10*3/uL (ref 0.0–0.1)
Basophils Relative: 0.7 % (ref 0.0–3.0)
Eosinophils Absolute: 0.1 10*3/uL (ref 0.0–0.7)
Eosinophils Relative: 1.5 % (ref 0.0–5.0)
HCT: 43.9 % (ref 39.0–52.0)
Hemoglobin: 14.6 g/dL (ref 13.0–17.0)
Lymphocytes Relative: 25.7 % (ref 12.0–46.0)
Lymphs Abs: 1.8 10*3/uL (ref 0.7–4.0)
MCHC: 33.2 g/dL (ref 30.0–36.0)
MCV: 97.5 fl (ref 78.0–100.0)
Monocytes Absolute: 0.7 10*3/uL (ref 0.1–1.0)
Monocytes Relative: 9.5 % (ref 3.0–12.0)
Neutro Abs: 4.3 10*3/uL (ref 1.4–7.7)
Neutrophils Relative %: 62.6 % (ref 43.0–77.0)
Platelets: 236 10*3/uL (ref 150.0–400.0)
RBC: 4.5 Mil/uL (ref 4.22–5.81)
RDW: 13.4 % (ref 11.5–15.5)
WBC: 6.9 10*3/uL (ref 4.0–10.5)

## 2017-07-25 LAB — COMPREHENSIVE METABOLIC PANEL
ALT: 10 U/L (ref 0–53)
AST: 14 U/L (ref 0–37)
Albumin: 3.9 g/dL (ref 3.5–5.2)
Alkaline Phosphatase: 66 U/L (ref 39–117)
BUN: 19 mg/dL (ref 6–23)
CO2: 28 mEq/L (ref 19–32)
Calcium: 9.3 mg/dL (ref 8.4–10.5)
Chloride: 101 mEq/L (ref 96–112)
Creatinine, Ser: 1.5 mg/dL (ref 0.40–1.50)
GFR: 46.04 mL/min — ABNORMAL LOW (ref >60.00)
Glucose, Bld: 101 mg/dL — ABNORMAL HIGH (ref 70–99)
Potassium: 4 mEq/L (ref 3.5–5.1)
Sodium: 138 mEq/L (ref 135–145)
Total Bilirubin: 0.6 mg/dL (ref 0.2–1.2)
Total Protein: 6 g/dL (ref 6.0–8.3)

## 2017-07-25 LAB — TSH: TSH: 2.84 u[IU]/mL (ref 0.35–4.50)

## 2017-07-25 NOTE — Progress Notes (Signed)
Subjective:    Patient ID: Robert Rowe, male    DOB: 1918/04/01, 83 y.o.   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  He is followed by cardiology and urology as well as neurology and oncology. He complains of forgetfulness and DOE which has been chronic. Primary function studies have revealed marked decrease in diffusion capacity with a normal FEV1 and lung volumes. O2 saturation was 88% on arrival to the examining room, but quickly improved to 95% with rest He does have a history of RA, but this has been quite stable and he states that he does not feel he will follow-up with rheumatology.  Further  Past Medical History:  Diagnosis Date  . BENIGN PROSTATIC HYPERTROPHY 04/05/2007  . 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 CHEST DISCOMFORT 04/25/2009  . Chronic diastolic CHF (congestive heart failure) ([place])    a. Echo 12/16: mild LVH, EF 55-60%, no RWMA, Gr 1 DD, mild AI, MAC, mild LAE, normal RVF, PASP 37 mmHg  . Chronic kidney disease    chronic   . DJD (degenerative joint disease)   . Dysrhythmia    pvc's   . GERD 12/03/2007  . Hiatal hernia   . History of nuclear stress test    Myoview 1/17: EF 53%, normal perfusion; Low Risk  . HYPERLIPIDEMIA 04/05/2007  . Hypothyroidism   . Osteoarth NOS-Unspec 04/05/2007  . Palpitations 02/12/2010  . PEDAL EDEMA 05/01/2008  . Rheumatoid arthritis(714.0) 04/05/2007  . Shortness of breath dyspnea   . TRANSIENT ISCHEMIC ATTACK, HX OF 04/05/2007   ?      Social History   Social History  . Marital status: Married    Spouse name: N/A  . Number of children: N/A  . Years of education: N/A   Occupational History  . Not on file.   Social History Main Topics  . Smoking status: Former Smoker    Types: Cigarettes    Quit date: 08/13/1953  . Smokeless tobacco: Never Used  . Alcohol use Yes     Comment: occ vodka  . Drug use: No  . Sexual activity: Not on file   Other  Topics Concern  . Not on file   Social History Narrative  . No narrative on file    Past Surgical History:  Procedure Laterality Date  . BALLOON DILATION N/A 03/12/2013   Procedure: BALLOON DILATION;  Surgeon: Inda Castle, MD;  Location: [facility];  Service: Endoscopy;  Laterality: N/A;  . CATARACT EXTRACTION  2001   bilateral  . CYSTOSCOPY WITH URETHRAL DILATATION N/A 09/24/2015   Procedure: CYSTOSCOPY WITH COOK BALLOON DILATATION OF URETHRAL STRICTURE ;  Surgeon: Carolan Clines, MD;  Location: [facility];  Service: Urology;  Laterality: N/A;  . CYSTOSCOPY WITH URETHRAL DILATATION N/A 07/20/2016   Procedure: CYSTOSCOPY WITH URETHRAL DILATATION;  Surgeon: Alexis Frock, MD;  Location: [facility];  Service: Urology;  Laterality: N/A;  1 HOUR  . ESOPHAGOGASTRODUODENOSCOPY N/A 03/12/2013   Procedure: ESOPHAGOGASTRODUODENOSCOPY (EGD);  Surgeon: Inda Castle, MD;  Location: [facility];  Service: Endoscopy;  Laterality: N/A;  . HEMORRHOID SURGERY    . HERNIA REPAIR     inguinal x5  . KNEE SURGERY  arthroscopic   left  . PROSTATE CRYOABLATION     2000  . SHOULDER SURGERY  few years ago   left  . TRANSURETHRAL RESECTION OF PROSTATE N/A 03/19/2015   Procedure: TRANSURETHRAL RESECTION OF  THE PROSTATE (TURP);  Surgeon: Carolan Clines, MD;  Location: [facility];  Service: Urology;  Laterality: N/A;    Family History  Problem Relation Age of Onset  . Breast cancer Mother   . Heart disease Father   . Heart attack Father   . Lymphoma Brother   . Stroke Neg Hx   . Hypertension Neg Hx     Allergies  Allergen Reactions  . Sulfonamide Derivatives Other (See Comments)    Patient does not remember allergy symptoms    Current Outpatient Prescriptions on File Prior to Visit  Medication Sig Dispense Refill  . clopidogrel (PLAVIX) 75 MG tablet Take 1 tablet (75 mg total) by mouth daily. 90 tablet 1  . furosemide (LASIX) 20 MG tablet Take 20 mg by mouth daily as needed for fluid or  edema.   10  . levothyroxine (SYNTHROID, LEVOTHROID) 50 MCG tablet TAKE 1 TABLET (50 MCG TOTAL) BY MOUTH DAILY. 90 tablet 3  . metoprolol succinate (TOPROL-XL) 25 MG 24 hr tablet Take 1 tablet (25 mg total) by mouth at bedtime. 90 tablet 1  . Multiple Vitamin (MULTIVITAMIN WITH MINERALS) TABS tablet Take 1 tablet by mouth 3 (three) times a week.    . nitroGLYCERIN (NITROSTAT) 0.4 MG SL tablet Place 0.4 mg under the tongue every 5 (five) minutes as needed (ESOPHEAGEAL SPASPMS). Reported on 03/01/2016    . pantoprazole (PROTONIX) 40 MG tablet Take 1 tablet (40 mg total) by mouth daily. 90 tablet 1   No current facility-administered medications on file prior to visit.     BP 130/76   Pulse 68   Temp (!) 97.3 F (36.3 C) (Oral)   Ht _0  (1.575 m)   Wt 152 lb 12.8 oz (69.3 kg)   SpO2 95%   BMI 27.95 kg/m     Review of Systems  Constitutional: Negative for appetite change, chills, fatigue and fever.  HENT: Negative for congestion, dental problem, ear pain, hearing loss, sore throat, tinnitus, trouble swallowing and voice change.   Eyes: Negative for pain, discharge and visual disturbance.  Respiratory: Positive for shortness of breath. Negative for cough, chest tightness, wheezing and stridor.   Cardiovascular: Negative for chest pain, palpitations and leg swelling.  Gastrointestinal: Negative for abdominal distention, abdominal pain, blood in stool, constipation, diarrhea, nausea and vomiting.  Genitourinary: Negative for difficulty urinating, discharge, flank pain, genital sores, hematuria and urgency.  Musculoskeletal: Positive for arthralgias and gait problem. Negative for back pain, joint swelling, myalgias and neck stiffness.  Skin: Negative for rash.  Neurological: Positive for weakness. Negative for dizziness, syncope, speech difficulty, numbness and headaches.  Hematological: Negative for adenopathy. Does not bruise/bleed easily.  Psychiatric/Behavioral: Positive for confusion  and decreased concentration. Negative for behavioral problems and dysphoric mood. The patient is not nervous/anxious.        Objective:   Physical Exam  Constitutional: He is oriented to person, place, and time. He appears well-developed.  Blood pressure 130/70 O2 saturation 95% at rest  Requires assistance to transfer to the examining table  HENT:  Head: Normocephalic.  Right Ear: External ear normal.  Left Ear: External ear normal.  Eyes: Conjunctivae and EOM are normal.  Neck: Normal range of motion.  Cardiovascular: Normal rate and normal heart sounds.   Pulmonary/Chest: Breath sounds normal.  Abdominal: Bowel sounds are normal.  Musculoskeletal: Normal range of motion. He exhibits edema. He exhibits no tenderness.  Plus 1 pedal edema  Neurological: He is alert and oriented to person, place,  and time.  Psychiatric: He has a normal mood and affect. His behavior is normal.          Assessment & Plan:   Mild cognitive impairment Chronic DOE RA stable BPH.  Follow-up urology Essential hypertension, stable hypothyroidism  No change in medical regimen We'll check laboratory update including TSH Flu vaccine next month.  Encouraged   Nyoka Cowden

## 2017-07-25 NOTE — Patient Instructions (Signed)
Limit your sodium (Salt) intake  Cardiology and urology follow-up as scheduled  Please obtain your flu vaccine in October or November  Return in 6 months for follow-up

## 2017-07-28 ENCOUNTER — Ambulatory Visit: Payer: Medicare Other | Admitting: Physical Therapy

## 2017-07-31 ENCOUNTER — Telehealth: Payer: Self-pay | Admitting: Internal Medicine

## 2017-07-31 ENCOUNTER — Ambulatory Visit: Payer: Medicare Other | Admitting: Physical Therapy

## 2017-07-31 NOTE — Telephone Encounter (Signed)
Pt is wondering what his lab results are. Please advise.

## 2017-08-02 ENCOUNTER — Ambulatory Visit: Payer: Medicare Other | Admitting: Physical Therapy

## 2017-08-07 ENCOUNTER — Ambulatory Visit: Payer: Medicare Other | Admitting: Physical Therapy

## 2017-08-09 ENCOUNTER — Ambulatory Visit: Payer: Medicare Other | Admitting: Physical Therapy

## 2017-08-10 ENCOUNTER — Encounter: Payer: Self-pay | Admitting: Internal Medicine

## 2017-08-23 ENCOUNTER — Telehealth: Payer: Self-pay | Admitting: *Deleted

## 2017-08-23 NOTE — Telephone Encounter (Signed)
Please advise 

## 2017-08-23 NOTE — Telephone Encounter (Signed)
Drs  Bing Plume, Herbert Deaner, [place]

## 2017-08-23 NOTE — Telephone Encounter (Signed)
Called pt and made him aware.

## 2017-08-23 NOTE — Telephone Encounter (Signed)
Patient called wanting to know who would Dr Burnice Logan recommend for an eye doctor. Please advise

## 2017-08-30 ENCOUNTER — Other Ambulatory Visit: Payer: Self-pay | Admitting: Family Medicine

## 2017-09-14 ENCOUNTER — Other Ambulatory Visit: Payer: Self-pay | Admitting: Internal Medicine

## 2017-10-20 ENCOUNTER — Other Ambulatory Visit: Payer: Self-pay | Admitting: Family Medicine

## 2017-10-20 ENCOUNTER — Telehealth: Payer: Self-pay | Admitting: Internal Medicine

## 2017-10-20 NOTE — Telephone Encounter (Signed)
Copied from [place] [id number]. Topic: General - Other >> Oct 20, 2017 10:28 AM Darl Householder, RMA wrote: Patient is requesting a callback at [phone] from Dr. Burnice Logan or [place]

## 2017-10-20 NOTE — Telephone Encounter (Signed)
Dr K pt 

## 2017-10-23 NOTE — Telephone Encounter (Signed)
Pt would like Rx mailed to his home address.

## 2017-10-23 NOTE — Telephone Encounter (Signed)
Pt is requesting a recommendation to an eye doctor. Pt doesn't want to go back to Dr Herbert Deaner.   Pt is also requesting a Rx for an [employer] stairlift  Please advise

## 2017-10-23 NOTE — Telephone Encounter (Signed)
Prescription mailed Please suggest either Dr. Katy Fitch  or Dr. Bing Plume

## 2017-10-24 NOTE — Telephone Encounter (Signed)
Called and discussed

## 2017-10-25 NOTE — Telephone Encounter (Signed)
Copied from [place] [id number]. Topic: General - Other >> Oct 25, 2017  1:48 PM Lennox Solders wrote: Reason for CRM: pt needs new rx for stairlift fax order to [facility] [phone]. Please also include pt [facility] customer number [phone]

## 2017-10-27 DIAGNOSIS — Z961 Presence of intraocular lens: Secondary | ICD-10-CM | POA: Diagnosis not present

## 2017-10-27 DIAGNOSIS — H401133 Primary open-angle glaucoma, bilateral, severe stage: Secondary | ICD-10-CM | POA: Diagnosis not present

## 2017-10-27 DIAGNOSIS — H10413 Chronic giant papillary conjunctivitis, bilateral: Secondary | ICD-10-CM | POA: Diagnosis not present

## 2017-11-01 NOTE — Telephone Encounter (Signed)
Called patient. He received rx in the mail and sent it out. No further needs from our office at this time.

## 2017-11-10 ENCOUNTER — Emergency Department (HOSPITAL_COMMUNITY): Payer: Medicare Other

## 2017-11-10 ENCOUNTER — Emergency Department (HOSPITAL_COMMUNITY)
Admission: EM | Admit: 2017-11-10 | Discharge: 2017-11-10 | Disposition: A | Discharge status: Home / Self Care | Payer: Medicare Other | Attending: Emergency Medicine | Admitting: Emergency Medicine

## 2017-11-10 ENCOUNTER — Encounter (HOSPITAL_COMMUNITY): Payer: Self-pay | Admitting: Pharmacy Technician

## 2017-11-10 ENCOUNTER — Ambulatory Visit: Payer: Self-pay | Admitting: *Deleted

## 2017-11-10 DIAGNOSIS — I5032 Chronic diastolic (congestive) heart failure: Secondary | ICD-10-CM | POA: Diagnosis not present

## 2017-11-10 DIAGNOSIS — E039 Hypothyroidism, unspecified: Secondary | ICD-10-CM | POA: Diagnosis not present

## 2017-11-10 DIAGNOSIS — Z87891 Personal history of nicotine dependence: Secondary | ICD-10-CM | POA: Insufficient documentation

## 2017-11-10 DIAGNOSIS — R9431 Abnormal electrocardiogram [ECG] [EKG]: Secondary | ICD-10-CM | POA: Diagnosis not present

## 2017-11-10 DIAGNOSIS — R42 Dizziness and giddiness: Secondary | ICD-10-CM

## 2017-11-10 DIAGNOSIS — N189 Chronic kidney disease, unspecified: Secondary | ICD-10-CM | POA: Diagnosis not present

## 2017-11-10 DIAGNOSIS — I130 Hypertensive heart and chronic kidney disease with heart failure and stage 1 through stage 4 chronic kidney disease, or unspecified chronic kidney disease: Secondary | ICD-10-CM | POA: Diagnosis not present

## 2017-11-10 DIAGNOSIS — R55 Syncope and collapse: Secondary | ICD-10-CM | POA: Diagnosis not present

## 2017-11-10 DIAGNOSIS — Z8673 Personal history of transient ischemic attack (TIA), and cerebral infarction without residual deficits: Secondary | ICD-10-CM | POA: Diagnosis not present

## 2017-11-10 LAB — CBC WITH DIFFERENTIAL/PLATELET
Basophils Absolute: 0 10*3/uL (ref 0.0–0.1)
Basophils Relative: 1 %
Eosinophils Absolute: 0.1 10*3/uL (ref 0.0–0.7)
Eosinophils Relative: 1 %
HCT: 45.8 % (ref 39.0–52.0)
Hemoglobin: 15.3 g/dL (ref 13.0–17.0)
Lymphocytes Relative: 30 %
Lymphs Abs: 2.4 10*3/uL (ref 0.7–4.0)
MCH: 32.1 pg (ref 26.0–34.0)
MCHC: 33.4 g/dL (ref 30.0–36.0)
MCV: 96 fL (ref 78.0–100.0)
Monocytes Absolute: 0.7 10*3/uL (ref 0.1–1.0)
Monocytes Relative: 9 %
Neutro Abs: 4.6 10*3/uL (ref 1.7–7.7)
Neutrophils Relative %: 59 %
Platelets: 231 10*3/uL (ref 150–400)
RBC: 4.77 MIL/uL (ref 4.22–5.81)
RDW: 13.2 % (ref 11.5–15.5)
WBC: 7.8 10*3/uL (ref 4.0–10.5)

## 2017-11-10 LAB — COMPREHENSIVE METABOLIC PANEL
ALT: 13 U/L — ABNORMAL LOW (ref 17–63)
AST: 22 U/L (ref 15–41)
Albumin: 4.1 g/dL (ref 3.5–5.0)
Alkaline Phosphatase: 75 U/L (ref 38–126)
Anion gap: 10 (ref 5–15)
BUN: 19 mg/dL (ref 6–20)
CO2: 25 mmol/L (ref 22–32)
Calcium: 9.5 mg/dL (ref 8.9–10.3)
Chloride: 103 mmol/L (ref 101–111)
Creatinine, Ser: 1.52 mg/dL — ABNORMAL HIGH (ref 0.61–1.24)
GFR calc Af Amer: 42 mL/min — ABNORMAL LOW (ref >60)
GFR calc non Af Amer: 37 mL/min — ABNORMAL LOW (ref >60)
Glucose, Bld: 101 mg/dL — ABNORMAL HIGH (ref 65–99)
Potassium: 4.2 mmol/L (ref 3.5–5.1)
Sodium: 138 mmol/L (ref 135–145)
Total Bilirubin: 0.9 mg/dL (ref 0.3–1.2)
Total Protein: 7.1 g/dL (ref 6.5–8.1)

## 2017-11-10 LAB — TROPONIN I: Troponin I: <0.03 ng/mL (ref <0.03)

## 2017-11-10 MED ORDER — MECLIZINE HCL 25 MG PO TABS
25.0000 mg | ORAL_TABLET | Freq: Once | ORAL | Status: AC
  Administered 2017-11-10: 25 mg via ORAL
  Filled 2017-11-10: qty 1

## 2017-11-10 MED ORDER — MECLIZINE HCL 25 MG PO TABS: 25.0000 mg | ORAL_TABLET | Freq: Three times a day (TID) | ORAL | 0 refills | Status: DC | PRN

## 2017-11-10 MED ORDER — SODIUM CHLORIDE 0.9 % IV BOLUS (SEPSIS)
1000.0000 mL | Freq: Once | INTRAVENOUS | Status: AC
  Administered 2017-11-10: 1000 mL via INTRAVENOUS

## 2017-11-10 NOTE — ED Notes (Signed)
Pt refused dc vitals. Pt eager to leave. Pt verbalized understanding of discharge instructions.

## 2017-11-10 NOTE — ED Provider Notes (Signed)
Emergency Department Provider Note   I have reviewed the triage vital signs and the nursing notes.   HISTORY  Chief Complaint Dizziness   HPI Robert Rowe is a 83 y.o. male with a history of CHF, hyperlipidemia, hypothyroidism, TIAs the presents to the emergency department from Dr. Noreene Filbert office secondary to concern for vertigo.  Patient states he noticed significant lightheadedness and feelings you pass out whenever he moves his head started this morning after he woke up.  Last time he felt well as last night.  Went to see Dr. Erik Obey and then came here.  At this time his symptoms seem to be improving.  He has no nausea or vomiting associated with it.  Not ever had this before.  He has no chest pain, cough, respiratory symptoms.  No other associated modifying symptoms.  Has not tried anything for symptoms at this time.  No recent fevers.   Past Medical History:  Diagnosis Date  . BENIGN PROSTATIC HYPERTROPHY 04/05/2007  . 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 CHEST DISCOMFORT 04/25/2009  . Chronic diastolic CHF (congestive heart failure) ([place])    a. Echo 12/16: mild LVH, EF 55-60%, no RWMA, Gr 1 DD, mild AI, MAC, mild LAE, normal RVF, PASP 37 mmHg  . Chronic kidney disease    chronic   . DJD (degenerative joint disease)   . Dysrhythmia    pvc's   . GERD 12/03/2007  . Hiatal hernia   . History of nuclear stress test    Myoview 1/17: EF 53%, normal perfusion; Low Risk  . HYPERLIPIDEMIA 04/05/2007  . Hypothyroidism   . Osteoarth NOS-Unspec 04/05/2007  . Palpitations 02/12/2010  . PEDAL EDEMA 05/01/2008  . Rheumatoid arthritis(714.0) 04/05/2007  . Shortness of breath dyspnea   . TRANSIENT ISCHEMIC ATTACK, HX OF 04/05/2007   ?     Patient Active Problem List   Diagnosis Date Noted  . Other fatigue 06/13/2017  . Abnormal diffusion capacity determined by pulmonary function test 06/13/2017  . Chronic diastolic CHF (congestive heart failure) ([place]) 11/01/2016   . Essential hypertension 11/01/2016  . Intracranial vascular stenosis 11/09/2015  . Mild cognitive impairment 11/09/2015  . 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 Benign prostatic hyperplasia with urinary obstruction 03/19/2015  . Awareness alteration, transient 02/26/2015  . PVC's (premature ventricular contractions) 08/22/2013  . Hypothyroidism 05/09/2013  . Stricture and stenosis of esophagus 03/12/2013  . Dysphagia, unspecified(787.20) 02/22/2013  . DOE (dyspnea on exertion) 03/23/2011  . PALPITATIONS 02/12/2010  . CHEST DISCOMFORT 04/25/2009  . PEDAL EDEMA 05/01/2008  . GERD 12/03/2007  . Dyslipidemia 04/05/2007  . BENIGN PROSTATIC HYPERTROPHY 04/05/2007  . Rheumatoid arthritis ([place]) 04/05/2007  . Osteoarthrosis, unspecified whether generalized or localized, unspecified site 04/05/2007  . History of cardiovascular disorder 04/05/2007    Past Surgical History:  Procedure Laterality Date  . BALLOON DILATION N/A 03/12/2013   Procedure: BALLOON DILATION;  Surgeon: Inda Castle, MD;  Location: [facility];  Service: Endoscopy;  Laterality: N/A;  . CATARACT EXTRACTION  2001   bilateral  . CYSTOSCOPY WITH URETHRAL DILATATION N/A 09/24/2015   Procedure: CYSTOSCOPY WITH COOK BALLOON DILATATION OF URETHRAL STRICTURE ;  Surgeon: Carolan Clines, MD;  Location: [facility];  Service: Urology;  Laterality: N/A;  . CYSTOSCOPY WITH URETHRAL DILATATION N/A 07/20/2016   Procedure: CYSTOSCOPY WITH URETHRAL DILATATION;  Surgeon: Alexis Frock, MD;  Location: [facility];  Service: Urology;  Laterality: N/A;  1 HOUR  . ESOPHAGOGASTRODUODENOSCOPY N/A 03/12/2013  Procedure: ESOPHAGOGASTRODUODENOSCOPY (EGD);  Surgeon: Inda Castle, MD;  Location: [facility];  Service: Endoscopy;  Laterality: N/A;  . HEMORRHOID SURGERY    . HERNIA REPAIR     inguinal x5  . KNEE SURGERY  arthroscopic   left  . PROSTATE CRYOABLATION     2000  . SHOULDER SURGERY  few years ago   left  . TRANSURETHRAL  RESECTION OF PROSTATE N/A 03/19/2015   Procedure: TRANSURETHRAL RESECTION OF THE PROSTATE (TURP);  Surgeon: Carolan Clines, MD;  Location: [facility];  Service: Urology;  Laterality: N/A;    Current Outpatient Rx  . Order #: [id number] Class: Normal  . Order #: [id number] Class: Historical Med  . Order #: [id number] Class: Normal  . Order #: [id number] Class: Normal  . Order #: [id number] Class: Historical Med  . Order #: [id number] Class: Normal  . Order #: [id number] Class: Historical Med  . Order #: [id number] Class: Normal  . Order #: [id number] Class: Print    Allergies Sulfonamide derivatives  Family History  Problem Relation Age of Onset  . Breast cancer Mother   . Heart disease Father   . Heart attack Father   . Lymphoma Brother   . Stroke Neg Hx   . Hypertension Neg Hx     Social History Social History   Tobacco Use  . Smoking status: Former Smoker    Types: Cigarettes    Last attempt to quit: 08/13/1953    Years since quitting: 64.2  . Smokeless tobacco: Never Used  Substance Use Topics  . Alcohol use: Yes    Comment: occ vodka  . Drug use: No    Review of Systems  All other systems negative except as documented in the HPI. All pertinent positives and negatives as reviewed in the HPI. ____________________________________________   PHYSICAL EXAM:  VITAL SIGNS: ED Triage Vitals  Enc Vitals Group     BP 11/10/17 1742 (!) 170/105     Pulse Rate 11/10/17 1742 83     Resp 11/10/17 1742 14     Temp 11/10/17 1742 97.6 F (36.4 C)     Temp Source 11/10/17 1742 Oral     SpO2 11/10/17 1742 100 %     Weight 11/10/17 1727 146 lb (66.2 kg)     Height 11/10/17 1727 5' 2"  (1.575 m)    Constitutional: Alert and oriented. Well appearing and in no acute distress. Eyes: Conjunctivae are normal. EOMI. Left pupil blown, baseline vision in that eye. Head: Atraumatic. Nose: No congestion/rhinnorhea. Mouth/Throat: Mucous membranes are moist.  Oropharynx non-erythematous. Neck: No  stridor.  No meningeal signs.   Cardiovascular: Normal rate, regular rhythm. Good peripheral circulation. Grossly normal heart sounds.   Respiratory: Normal respiratory effort.  No retractions. Lungs CTAB. Gastrointestinal: Soft and nontender. No distention.  Musculoskeletal: No lower extremity tenderness nor edema. No gross deformities of extremities. Neurologic:  Normal speech and language. No gross focal neurologic deficits are appreciated. No altered mental status, able to give full seemingly accurate history.  Face is symmetric, EOM's intact, pupils at baseline, vision intact, tongue and uvula midline without deviation. Upper and Lower extremity motor 5/5, intact pain perception in distal extremities, 2+ reflexes in biceps, patella and achilles tendons. Able to perform finger to nose normal with both hands. Walks without assistance or evident ataxia.  Skin:  Skin is warm, dry and intact. No rash noted.   ____________________________________________   LABS (all labs ordered are listed, but only abnormal results are displayed)  Labs Reviewed  COMPREHENSIVE METABOLIC PANEL - Abnormal; Notable  for the following components:      Result Value   Glucose, Bld 101 (*)    Creatinine, Ser 1.52 (*)    ALT 13 (*)    GFR calc non Af Amer 37 (*)    GFR calc Af Amer 42 (*)    All other components within normal limits  CBC WITH DIFFERENTIAL/PLATELET  TROPONIN I   ____________________________________________  EKG   EKG Interpretation  Date/Time:    Ventricular Rate:    PR Interval:    QRS Duration:   QT Interval:    QTC Calculation:   R Axis:     Text Interpretation:         ____________________________________________  RADIOLOGY  Ct Head Wo Contrast  Result Date: 11/10/2017 CLINICAL DATA:  Syncopal episode, dizzy EXAM: CT HEAD WITHOUT CONTRAST TECHNIQUE: Contiguous axial images were obtained from the base of the skull through the vertex without intravenous contrast.  COMPARISON:  04/15/ 2016, CT brain 01/02/2012 FINDINGS: Brain: No acute territorial infarction, hemorrhage or intracranial mass is visualized. Marked atrophy. Minimal small vessel ischemic changes of the white matter. Enlarged ventricles likely due to atrophy. Vascular: No hyperdense vessels. Vertebral and carotid artery calcification Skull: No fracture or suspicious lesion Sinuses/Orbits: Mild mucosal thickening in the ethmoid and sphenoid sinuses. No acute orbital abnormality. Other: None IMPRESSION: 1. No CT evidence for acute intracranial abnormality. 2. Atrophy and minimal small vessel ischemic changes of the white matter. Electronically Signed   By: Donavan Foil M.D.   On: 11/10/2017 18:41    ____________________________________________   PROCEDURES  Procedure(s) performed:   Procedures   ____________________________________________   INITIAL IMPRESSION / ASSESSMENT AND PLAN / ED COURSE  Significantly improving symptoms at this time.  Is low but hypertensive.  Patient states he had TIAs in the past.  I discussed doing an MRI versus CT scan.  Patient does not think he had a stroke.  He has no other gross neurologic defects at this time.  In the acute onset would also speak against being a CVA.  I did discuss with him however that MRI is the best way to look for stroke however the plan at this point will be to start with symptomatic treatment and doing a CT scan.  If he is feeling significantly better and the CT scan is absolutely negative I think we can hold on MRI at this time.  However if either 1 of those are not met then we will need to do the MRI.  Patient CT without acute abnormalities.  Meclizine helped the symptoms.  Patient still does not want an MRI at this time I feel that that is appropriate.  He will follow-up with he has repeat vertigo is not improved with medication otherwise will follow up with neurology.   Pertinent labs & imaging results that were available during my  care of the patient were reviewed by me and considered in my medical decision making (see chart for details).  ____________________________________________  FINAL CLINICAL IMPRESSION(S) / ED DIAGNOSES  Final diagnoses:  Vertigo    MEDICATIONS GIVEN DURING THIS VISIT:  Medications  meclizine (ANTIVERT) tablet 25 mg (25 mg Oral Given 11/10/17 1849)  sodium chloride 0.9 % bolus 1,000 mL (0 mLs Intravenous Stopped 11/10/17 2049)     NEW OUTPATIENT MEDICATIONS STARTED DURING THIS VISIT:  This SmartLink is deprecated. Use AVSMEDLIST instead to display the medication list for a patient.  Note:  This note was prepared with assistance of Dragon voice recognition software. Occasional  wrong-word or sound-a-like substitutions may have occurred due to the inherent limitations of voice recognition software.   Kanesha Cadle, Corene Cornea, MD 11/11/17 [phone]

## 2017-11-10 NOTE — ED Notes (Signed)
Pt reports feeling much better. Pt reports no dizziness and family states pt appears to feel much better. Per family, pt is at baseline.

## 2017-11-10 NOTE — Telephone Encounter (Signed)
Reviewed triage with Dr Juleen China and reiterated that patient needs to go to the ED. I then called patient to explain this to him and he adamantly refuses to go to the ED. He states that he has an appointment with Dr Erik Obey, [facility] and that is where he is going to go.

## 2017-11-10 NOTE — ED Notes (Signed)
Pt. Ambulated in hallway with assistance, and stated no complaints of dizziness.

## 2017-11-10 NOTE — Telephone Encounter (Signed)
Pt's son called because the pt has been having dizziness since last night; nurse triage initiated and per protocol recommendation was made for pt to be evaluated in the ED; pt and his son decline and would like evaluation in the office; pt normally seen by Dr Inda Merlin at [facility] but no appointments available at, [facility] or [place]; will route to pools for availability of appointments; please call the pt's son at [phone] 1st or the the pt at [phone] 2nd.    Reason for Disposition . SEVERE dizziness (e.g., unable to stand, requires support to walk, feels like passing out now)  Answer Assessment - Initial Assessment Questions 1. DESCRIPTION: "Describe your dizziness."     lighthaded 2. LIGHTHEADED: "Do you feel lightheaded?" (e.g., somewhat faint, woozy, weak upon standing)     weak 3. VERTIGO: "Do you feel like either you or the room is spinning or tilting?" (i.e. vertigo)     yes 4. SEVERITY: "How bad is it?"  "Do you feel like you are going to faint?" "Can you stand and walk?"   - MILD - walking normally   - MODERATE - interferes with normal activities (e.g., work, school)    - SEVERE - unable to stand, requires support to walk, feels like passing out now.      severe 5. ONSET:  "When did the dizziness begin?"     Last night 6. AGGRAVATING FACTORS: "Does anything make it worse?" (e.g., standing, change in head position)     standing 7. HEART RATE: "Can you tell me your heart rate?" "How many beats in 15 seconds?"  (Note: not all patients can do this)     24 beats in 15 seconds   8. CAUSE: "What do you think is causing the dizziness?"     Maybe his ear  9. RECURRENT SYMPTOM: "Have you had dizziness before?" If so, ask: "When was the last time?" "What happened that time?"     no 10. OTHER SYMPTOMS: "Do you have any other symptoms?" (e.g., fever, chest pain, vomiting, diarrhea, bleeding)      no 11. PREGNANCY: "Is there any chance you are pregnant?" "When  was your last menstrual period?"       n/a  Protocols used: DIZZINESS [facility]

## 2017-11-10 NOTE — ED Triage Notes (Addendum)
Pt st's when he woke up this am he almost had a syncopal episode when he stood due to being dizzy.  Pt st's he has been dizzy all day with nausea.  Pt alert and oriented x's 3. Pt denies any blurry or double vision

## 2017-11-15 ENCOUNTER — Other Ambulatory Visit: Payer: Self-pay | Admitting: Family Medicine

## 2017-11-15 ENCOUNTER — Other Ambulatory Visit: Payer: Self-pay | Admitting: Internal Medicine

## 2017-11-16 DIAGNOSIS — H10413 Chronic giant papillary conjunctivitis, bilateral: Secondary | ICD-10-CM | POA: Diagnosis not present

## 2017-11-16 DIAGNOSIS — Z961 Presence of intraocular lens: Secondary | ICD-10-CM | POA: Diagnosis not present

## 2017-11-16 DIAGNOSIS — H401133 Primary open-angle glaucoma, bilateral, severe stage: Secondary | ICD-10-CM | POA: Diagnosis not present

## 2017-11-17 ENCOUNTER — Ambulatory Visit: Payer: Medicare Other | Admitting: Neurology

## 2017-11-17 ENCOUNTER — Encounter: Payer: Self-pay | Admitting: Neurology

## 2017-11-17 VITALS — BP 132/60 | Ht 62.0 in | Wt 151.0 lb

## 2017-11-17 DIAGNOSIS — G450 Vertebro-basilar artery syndrome: Secondary | ICD-10-CM

## 2017-11-17 DIAGNOSIS — G5602 Carpal tunnel syndrome, left upper limb: Secondary | ICD-10-CM | POA: Diagnosis not present

## 2017-11-17 DIAGNOSIS — I679 Cerebrovascular disease, unspecified: Secondary | ICD-10-CM | POA: Diagnosis not present

## 2017-11-17 NOTE — Patient Instructions (Signed)
1. Continue all your medications 2. Start wearing wrist splint on the left wrist to help stabilize the nerve 3. Increase fluid hydration 4. Follow-up in 6 months, call for any changes

## 2017-11-17 NOTE — Progress Notes (Signed)
NEUROLOGY FOLLOW UP OFFICE NOTE  Robert Rowe [id number]  DOB: 1917/12/21  HISTORY OF PRESENT ILLNESS: I had the pleasure of seeing [name] in follow-up in the neurology clinic on 11/17/2017. He is again accompanied by his wife who helps supplement the history. The patient was last seen more than a year ago. He was initially seen for memory changes, but has also later on reported episodes of passing out, as well as bilateral leg weakness with slurred speech. MRA head and neck showed intracranial stenosis, with significant intracranial stenosis in basilar, right ICA cavernous/supraclinoid junction, moderate narrowing in bilateral ACA, right ACA, distal right vertebral, bilateral PCA stenosis. Recurrent symptoms concerning for hypoperfusion causing recurrent TIAs. He is taking Plavix daily. He denies any further episodes of unresponsiveness or leg weakness, but was in the ER last 11/10/17 for dizziness. He reports dizziness lasted 36 hours. He has difficulty describing his symptoms except that he felt unsteady. He reports some tinnitus. There was no nausea/vomiting or confusion. He denied any speech difficulties, focal numbness/tingling/weakness, vision changes. His wife reports a fall in December 2017 where he sustained a deep avulsion on the right lower leg. He went to the wound care center for 5 months and had stem cell therapy. He has not been driving since then. His memory is bad, he cannot remember names. He has numbness in the median nerve distribution of left hand with minimal weakness in his hands. He continues to manage his medications without difficulties. He denies any headaches, dizziness, diplopia, focal numbness/tingling/weakness, olfactory/gustatory hallucinations, myoclonic jerks.   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 He reports that he started  having recurrent symptoms in 2001, he was driving to his dentist when a peculiar feeling came over him, "running round in my head," for a couple of minutes. He denied any focal symptoms, he was able to drive without any confusion. He was evaluated at that time with an MRI brain reported as normal. He had another episode in 2004 with a normal EEG. He reports a history of hypoglycemia where he gets shaky, and that his blood glucose levels and BP were unremarkable during these episodes. He did well for several years until 2013 when he had 4 spells of confusion where a sensation of "ideas repeating over and over in his brain" occurred. There is note that he may have had weakness in the right arm with one of these spells. No associated headache. He was evaluated by neurologist [name], routine EEG normal. MRI brain did not show any acute changes, I personally reviewed scan which showed diffuse atrophy with likely ex vacuo ventriculomegaly, MRA head showed mild intracranial atherosclerosis in the left PCA, distal flow preserved. He again had no further episodes for another 3 years until March 2016 while his wife was driving, she asked him to roll his window down. He reports he was taking a nap, as as he was waking up, he was trying to reach for the window with his right hand but could not find it (eyes were closed per patient because he was just waking up), then his wife noticed he seemed to be waving his right hand in the air and was trying to talk but could not get words out. He recalls this episode and denied any confusion. It only lasted a few seconds. The second episode occurred a week or so after, he  woke up from his nap and was again unable to talk for a few seconds. The last episode occurred 02/22/15 while in a restaurant. They report that he had not eaten that evening, he spoke at a meeting, then went to the restaurant after. He recalls feeling sleepy and went to sleep, but his wife reports that he suddenly  slumped down then to the wall on his right side, eyes closed for a few seconds. She called his name and he started to sit up and drank from his cup. His wife reports he was perfectly normal soon after and did not appear confused or drowsy. He denied any associated focal numbness/tingling, or post-event weakness, no headaches, dizziness. He denies any other episodes of speech difficulties. He feels these may be hypoglycemic episodes, and has been advised to use a glucometer in the past but has not done this.   His routine EEG was normal. 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 I had discussed findings with [name] on the phone, likely due to small vessel disease, and controlling vascular risk factors. He is aware of elevated cholesterol but did not want to start any medication. He continues on Plavix taken 5 times a week due to easy bruising and bleeding (when he was having TURP).   On his visit in November 2016, his wife and son reported a change in symptoms over the course of a month. On 08/27/15, he was alone at home watching TV, when he suddenly started "feeling down," then he broke out in a sweat. He felt temporarily confused. He called his wife then called EMS and was brought to [facility] ER, on arrival he was oriented x 3, BP 130/82, HR 78. EKG reported sinus rhythm, multiple premature compexes, vent; borderline repolarization abnormality; no significant change since last tracing. CBG 107, CBC and BMP unremarkable except for creatinine of 1.41. Urinalysis negative. He wanted to go home and was discharged at baseline. His family reports that since then, he has had 8 episodes where he suddenly gets weak and if standing, would start listing to his left. His wife tried to help him one time and they both ended up on the floor. He could not move either  leg. His son came one time and could not get him up, it appeared he could not move his legs, so he crawled to a stool, then was able to get to his knees, then after a few minutes he felt better and stood up and started walking. Last episode was 3 days ago. His wife also notices that his speech would get very slurred when there episodes occur.   PAST MEDICAL HISTORY: Past Medical History:  Diagnosis Date  . BENIGN PROSTATIC HYPERTROPHY 04/05/2007  . 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 CHEST DISCOMFORT 04/25/2009  . Chronic diastolic CHF (congestive heart failure) ([place])    a. Echo 12/16: mild LVH, EF 55-60%, no RWMA, Gr 1 DD, mild AI, MAC, mild LAE, normal RVF, PASP 37 mmHg  . Chronic kidney disease    chronic   . DJD (degenerative joint disease)   . Dysrhythmia    pvc's   . GERD 12/03/2007  . Hiatal hernia   . History of nuclear stress test    Myoview 1/17: EF 53%, normal perfusion; Low Risk  . HYPERLIPIDEMIA 04/05/2007  . Hypothyroidism   .  Osteoarth NOS-Unspec 04/05/2007  . Palpitations 02/12/2010  . PEDAL EDEMA 05/01/2008  . Rheumatoid arthritis(714.0) 04/05/2007  . Shortness of breath dyspnea   . TRANSIENT ISCHEMIC ATTACK, HX OF 04/05/2007   ?     MEDICATIONS: Current Outpatient Medications on File Prior to Visit  Medication Sig Dispense Refill  . clopidogrel (PLAVIX) 75 MG tablet Take 1 tablet (75 mg total) by mouth daily. 90 tablet 1  . furosemide (LASIX) 20 MG tablet Take 20 mg by mouth daily as needed for fluid or edema.   10  . levothyroxine (SYNTHROID, LEVOTHROID) 50 MCG tablet TAKE 1 TABLET (50 MCG TOTAL) BY MOUTH DAILY. 90 tablet 1  . meclizine (ANTIVERT) 25 MG tablet Take 1 tablet (25 mg total) by mouth 3 (three) times daily as needed for dizziness. 30 tablet 0  . metoprolol succinate (TOPROL-XL) 25 MG 24 hr tablet Take 1 tablet (25 mg total) by mouth at bedtime. 90 tablet 1  . Multiple Vitamin (MULTIVITAMIN WITH MINERALS) TABS tablet Take 1 tablet by  mouth 3 (three) times a week.    . neomycin-polymyxin b-dexamethasone (MAXITROL) 3.5-10000-0.1 SUSP Place 2 drops into both eyes 4 (four) times daily. 5 mL 3  . nitroGLYCERIN (NITROSTAT) 0.4 MG SL tablet Place 0.4 mg under the tongue every 5 (five) minutes as needed (ESOPHEAGEAL SPASPMS). Reported on 03/01/2016    . pantoprazole (PROTONIX) 40 MG tablet Take 1 tablet (40 mg total) by mouth daily. 90 tablet 1   No current facility-administered medications on file prior to visit.     ALLERGIES: Allergies  Allergen Reactions  . Sulfonamide Derivatives Other (See Comments)    Patient does not remember allergy symptoms    FAMILY HISTORY: Family History  Problem Relation Age of Onset  . Breast cancer Mother   . Heart disease Father   . Heart attack Father   . Lymphoma Brother   . Stroke Neg Hx   . Hypertension Neg Hx     SOCIAL HISTORY: Social History   Socioeconomic History  . Marital status: Married    Spouse name: Not on file  . Number of children: Not on file  . Years of education: Not on file  . Highest education level: Not on file  Social Needs  . Financial resource strain: Not on file  . Food insecurity - worry: Not on file  . Food insecurity - inability: Not on file  . Transportation needs - medical: Not on file  . Transportation needs - non-medical: Not on file  Occupational History  . Not on file  Tobacco Use  . Smoking status: Former Smoker    Types: Cigarettes    Last attempt to quit: 08/13/1953    Years since quitting: 64.3  . Smokeless tobacco: Never Used  Substance and Sexual Activity  . Alcohol use: Yes    Comment: occ vodka  . Drug use: No  . Sexual activity: Not on file  Other Topics Concern  . Not on file  Social History Narrative  . Not on file    REVIEW OF SYSTEMS: Constitutional: No fevers, chills, or sweats, no generalized fatigue, change in appetite Eyes: No visual changes, double vision, eye pain Ear, nose and throat: No hearing loss, ear  pain, nasal congestion, sore throat Cardiovascular: No chest pain, palpitations Respiratory:  No shortness of breath at rest or with exertion, wheezes GastrointestinaI: No nausea, vomiting, diarrhea, abdominal pain, fecal incontinence Genitourinary:  No dysuria, urinary retention or frequency Musculoskeletal:  No neck pain, back pain  Integumentary: No rash, pruritus, skin lesions Neurological: as above Psychiatric: No depression, insomnia, anxiety Endocrine: No palpitations, fatigue, diaphoresis, mood swings, change in appetite, change in weight, increased thirst Hematologic/Lymphatic:  No anemia, purpura, petechiae. Allergic/Immunologic: no itchy/runny eyes, nasal congestion, recent allergic reactions, rashes  PHYSICAL EXAM: Vitals:   11/17/17 1607  BP: 132/60   General: No acute distress Head:  Normocephalic/atraumatic Neck: supple, no paraspinal tenderness, full range of motion Heart:  Regular rate and rhythm Lungs:  Clear to auscultation bilaterally Back: No paraspinal tenderness Skin/Extremities: No rash, no edema Neurological Exam: alert and oriented to person, place, and time. No aphasia or dysarthria. Fund of knowledge is appropriate.  Recent and remote memory are intact. 0/3 delayed recall.  Attention and concentration are normal.    Able to name objects and repeat phrases.  Cranial nerves: Pupils equal, round, reactive to light.  Extraocular movements intact with no nystagmus. Visual fields full. Facial sensation intact. No facial asymmetry. Tongue, uvula, palate midline.  Motor: Bulk and tone normal, muscle strength 5/5 throughout with no pronator drift.  Sensation to light touch intact.  No extinction to double simultaneous stimulation.  Deep tendon reflexes 2+ throughout, toes downgoing.  Finger to nose testing intact.  Gait slow and cautious, no ataxia. Negative Tinel sign on wrist.   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 He has had an unremarkable MRI brain and routine EEG in 2013, symptom-free until March 2016, with the last episode, he apparently very briefly slumped down to his right side. His routine EEG was normal. 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 In November 2016, his family reported episodes of bilateral leg weakness and slurred speech. MRA head and neck showed significant intracranial stenosis in the anterior and posterior circulation. He had another episode of loss of consciousness last 06/26/16 concerning for vertebrobasilar TIA. He is on Plavix. He does not want to start a statin. He had an episode of dizziness lasting 36 hours last week, neurological exam today is non-focal. We discussed that it may have been a small posterior circulation stroke, he does have significant intracranial stenosis. He declines doing an MRI brain. We discussed continued control of vascular risk factors, as well as increasing fluid hydration to avoid hypotension and low flow through diseased vessels. He continues to report memory issues, he did not continue [employer] in the past. He does not drive. He also reports numbness on the left hand suggestive of carpal tunnel syndrome, no weakness noted. He will try wrist splint, and if symptoms worsen, we will do an EMG and potentially refer to a hand surgeon (he states he is not interested in surgery). He will follow-up in 6 months and knows to call for any changes.  Thank you for allowing me to participate in his care.  Please do not hesitate to call for any questions or concerns.  The duration of this appointment visit was 25 minutes of face-to-face time with the patient.  Greater than 50% of this time was spent in counseling, explanation of diagnosis, planning of further management, and coordination  of care.   Ellouise Newer, M.D.   CC: Dr. Burnice Logan

## 2017-11-24 ENCOUNTER — Encounter: Payer: Self-pay | Admitting: Neurology

## 2017-12-05 DIAGNOSIS — N35010 Post-traumatic urethral stricture, male, meatal: Secondary | ICD-10-CM | POA: Diagnosis not present

## 2017-12-19 ENCOUNTER — Ambulatory Visit: Payer: Medicare Other | Admitting: Internal Medicine

## 2017-12-19 ENCOUNTER — Encounter: Payer: Self-pay | Admitting: Internal Medicine

## 2017-12-19 VITALS — BP 106/68 | HR 87 | Ht 62.0 in | Wt 153.6 lb

## 2017-12-19 DIAGNOSIS — R0600 Dyspnea, unspecified: Secondary | ICD-10-CM

## 2017-12-19 DIAGNOSIS — I5032 Chronic diastolic (congestive) heart failure: Secondary | ICD-10-CM | POA: Diagnosis not present

## 2017-12-19 DIAGNOSIS — R0609 Other forms of dyspnea: Secondary | ICD-10-CM

## 2017-12-19 DIAGNOSIS — I10 Essential (primary) hypertension: Secondary | ICD-10-CM

## 2017-12-19 NOTE — Patient Instructions (Signed)
Your physician wants you to follow-up in: 6 months with Dr. Hilty. You will receive a reminder letter in the mail two months in advance. If you don't receive a letter, please call our office to schedule the follow-up appointment.    

## 2017-12-19 NOTE — Progress Notes (Signed)
Cardiology Office Note   Date:  12/19/2017   ID:  Robert Rowe, DOB November 30, 1917, MRN [id number]  PCP:  Robert Lor, MD   CC: Shortness of breath   History of Present Illness: Robert Rowe is a 84 y.o. male who presents for scheduled follow-up office visit.  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 He has had numerous endoscopies and esophageal dilatations in the past. He does have a history of chest pain often triggered by alcohol ingestion or overeating. He had chest pain at [facility] in 2014. Stress test there was negative. Last seen by Dr. Mare Rowe 8/16.  Admitted 11/15/15 with chest pain. He had minimal relief with nitroglycerin at home. Cardiac enzymes remained negative. Telemetry was unremarkable. PVCs were noted.   He was seen 12/16 for volume excess. He was placed on Lasix. Echo demonstrated normal LVF, mild diastolic dysfunction, mild to mod pulmonary HTN.   He has been worried about his renal function.  He has tried to stay off of meloxicam as much as possible and he is also trying to cut back on his Protonix.  He has furosemide 40 mg on hand but does not take it every day.  He skips it if he is traveling.  The patient had blood work on 01/14/16 which showed that his renal function is stable with creatinine 1.6 to and his B natruretic peptide was normal at 36.5.  He has had mild bilateral peripheral edema which we have suspected is secondary to venous insufficiency rather than to ongoing congestive heart failure..  05/04/2016  I the pleasure of meeting Robert Rowe today in the office. 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 He has no known cardiac history but does have a history of high cholesterol and possible  diastolic dysfunction. He takes Lasix as needed for shortness of breath and swelling. He tends to use it as needed.  11/01/2016  Robert Rowe returns today for follow-up. He reports that his main concern is dyspnea. He says this is been present for months but recently is he comes somewhat worse. Workup earlier this year indicated stable systolic function with a low BNP of 36.5. He has chronic venous insufficiency which is likely the cause of his lower extremity swelling. He denies any orthopnea or PND. He does demonstrate some pursed lip breathing today in the office. He denies any wheezing, cough or chest pain. Weight is up about 4 pounds since his last office visit that he reports that he thinks she's been "eating too well.  06/13/2017  Robert Rowe returns today for follow-up. He is again reporting problems with shortness of breath. When I last saw him we have recommended an increase in his Lasix however he declined that. We also ordered pulmonary function tests which he did get. This demonstrated a severe diffusion defect. We did contact him with those results of that was possibly provided BMI chart. He says that he never received results, or also says that he felt that the results were normal. This is not the case, in fact we had referred him to pulmonary for evaluation however he refused the referral because he felt that the testing was normal. I explained to him again today that there was  a significant diffusion defect. He reports shortness of breath with exertion. In the office we walked him around to get on the ambulatory oxygen saturation. His oxygen saturation dropped to 86% with ambulation became up to 96% on 2 L oxygen.  12/19/2017  Robert Rowe was seen today in follow-up.  He again has complaints of shortness of breath today.  He has some memory issues, but in general is doing fairly well for his age.  He recently had some problems with dizziness which resolved.  He had meclizine to use if needed and  a CT scan of the brain showed some atrophy and small vessel disease.  His wife accompanied with him today and they have been married for 44 years.  She says he has become increasingly difficult to care for and almost sounded like she thought he should be in a skilled facility however when questioned did not want him to do that.  I also question about whether they would benefit from some home health care and she said "he would never let somebody else in the house".  When asked about his shortness of breath he says is fairly stable and generally tolerable since he "does not do much".  Vitals appear normal today.  Weight is stable.  His appetite is still reasonably good.  Past Medical History:  Diagnosis Date  . BENIGN PROSTATIC HYPERTROPHY 04/05/2007  . 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 CHEST DISCOMFORT 04/25/2009  . Chronic diastolic CHF (congestive heart failure) ([place])    a. Echo 12/16: mild LVH, EF 55-60%, no RWMA, Gr 1 DD, mild AI, MAC, mild LAE, normal RVF, PASP 37 mmHg  . Chronic kidney disease    chronic   . DJD (degenerative joint disease)   . Dysrhythmia    pvc's   . GERD 12/03/2007  . Hiatal hernia   . History of nuclear stress test    Myoview 1/17: EF 53%, normal perfusion; Low Risk  . HYPERLIPIDEMIA 04/05/2007  . Hypothyroidism   . Osteoarth NOS-Unspec 04/05/2007  . Palpitations 02/12/2010  . PEDAL EDEMA 05/01/2008  . Rheumatoid arthritis(714.0) 04/05/2007  . Shortness of breath dyspnea   . TRANSIENT ISCHEMIC ATTACK, HX OF 04/05/2007   ?     Past Surgical History:  Procedure Laterality Date  . BALLOON DILATION N/A 03/12/2013   Procedure: BALLOON DILATION;  Surgeon: Inda Castle, MD;  Location: [facility];  Service: Endoscopy;  Laterality: N/A;  . CATARACT EXTRACTION  2001   bilateral  . CYSTOSCOPY WITH URETHRAL DILATATION N/A 09/24/2015   Procedure: CYSTOSCOPY WITH COOK BALLOON DILATATION OF URETHRAL STRICTURE ;  Surgeon: Carolan Clines, MD;   Location: [facility];  Service: Urology;  Laterality: N/A;  . CYSTOSCOPY WITH URETHRAL DILATATION N/A 07/20/2016   Procedure: CYSTOSCOPY WITH URETHRAL DILATATION;  Surgeon: Alexis Frock, MD;  Location: [facility];  Service: Urology;  Laterality: N/A;  1 HOUR  . ESOPHAGOGASTRODUODENOSCOPY N/A 03/12/2013   Procedure: ESOPHAGOGASTRODUODENOSCOPY (EGD);  Surgeon: Inda Castle, MD;  Location: [facility];  Service: Endoscopy;  Laterality: N/A;  . HEMORRHOID SURGERY    . HERNIA REPAIR     inguinal x5  . KNEE SURGERY  arthroscopic   left  . PROSTATE CRYOABLATION     2000  . SHOULDER SURGERY  few years ago   left  . TRANSURETHRAL RESECTION OF PROSTATE N/A 03/19/2015   Procedure: TRANSURETHRAL RESECTION OF THE PROSTATE (TURP);  Surgeon: Carolan Clines, MD;  Location: [facility];  Service: Urology;  Laterality: N/A;     Current Outpatient Medications  Medication Sig Dispense Refill  . clopidogrel (PLAVIX) 75 MG tablet Take 1 tablet (75 mg total) by mouth daily. 90 tablet 1  . furosemide (LASIX) 20 MG tablet Take 20 mg by mouth daily as needed for fluid or edema.   10  . levothyroxine (SYNTHROID, LEVOTHROID) 50 MCG tablet TAKE 1 TABLET (50 MCG TOTAL) BY MOUTH DAILY. 90 tablet 1  . metoprolol succinate (TOPROL-XL) 25 MG 24 hr tablet Take 1 tablet (25 mg total) by mouth at bedtime. 90 tablet 1  . Multiple Vitamin (MULTIVITAMIN WITH MINERALS) TABS tablet Take 1 tablet by mouth 3 (three) times a week.    . neomycin-polymyxin b-dexamethasone (MAXITROL) 3.5-10000-0.1 SUSP Place 2 drops into both eyes 4 (four) times daily. 5 mL 3  . nitroGLYCERIN (NITROSTAT) 0.4 MG SL tablet Place 0.4 mg under the tongue every 5 (five) minutes as needed (ESOPHEAGEAL SPASPMS). Reported on 03/01/2016    . pantoprazole (PROTONIX) 40 MG tablet Take 1 tablet (40 mg total) by mouth daily. 90 tablet 1   No current facility-administered medications for this visit.     Allergies:   Sulfonamide derivatives    Social History:  The  patient  reports that he quit smoking about 64 years ago. His smoking use included cigarettes. he has never used smokeless tobacco. He reports that he drinks alcohol. He reports that he does not use drugs.   Family History:  The patient's family history includes Breast cancer in his mother; Heart attack in his father; Heart disease in his father; Lymphoma in his brother.    ROS:   Pertinent items noted in HPI and remainder of comprehensive ROS otherwise negative.  PHYSICAL EXAM: VS:  BP 106/68   Pulse 87   Ht 5' 2"  (1.575 m)   Wt 153 lb 9.6 oz (69.7 kg)   BMI 28.09 kg/m  , BMI Body mass index is 28.09 kg/m. General appearance: alert and no distress Neck: no carotid bruit, no JVD and thyroid not enlarged, symmetric, no tenderness/mass/nodules Lungs: diminished breath sounds bilaterally Heart: regular rate and rhythm Abdomen: soft, non-tender; bowel sounds normal; no masses,  no organomegaly Extremities: extremities normal, atraumatic, no cyanosis or edema Pulses: 2+ and symmetric Skin: Skin color, texture, turgor normal. No rashes or lesions Neurologic: Grossly normal Psych: Pleasant  EKG:   Normal sinus rhythm 87- personally reviewed  Recent Labs: 07/25/2017: TSH 2.84 11/10/2017: ALT 13; BUN 19; Creatinine, Ser 1.52; Hemoglobin 15.3; Platelets 231; Potassium 4.2; Sodium 138    Lipid Panel    Component Value Date/Time   CHOL 255 (H) 03/24/2016 1328   TRIG 327.0 (H) 03/24/2016 1328   HDL 37.90 (L) 03/24/2016 1328   CHOLHDL 7 03/24/2016 1328   VLDL 65.4 (H) 03/24/2016 1328   LDLCALC 116 (H) 11/15/2015 0421   LDLDIRECT 175.0 03/24/2016 1328      Wt Readings from Last 3 Encounters:  12/19/17 153 lb 9.6 oz (69.7 kg)  11/17/17 151 lb (68.5 kg)  11/10/17 146 lb (66.2 kg)      ASSESSMENT: 1. Dyspnea - severe diffusion defect on PFT's, refused oxygen 2. Hypertension 3. Chronic diastolic heart failure 4. CKD 3 5. PVCs  PLAN: Robert Rowe says his dyspnea is fairly  stable today.  He has refused oxygen in the past.  Blood pressure is well controlled.  Weight has been fairly stable.  His wife says that he has been increasingly more demanding to take care of.  They  are not amenable to home health services.  He denies any chest pain.  Follow-up in 6 months.  Pixie Casino, MD, [facility], [place] Director of the [facility] Lipid [facility]  Cardiovascular Risk Reduction [facility] Diplomate of the American Board of Clinical Lipidology Attending Cardiologist  Direct Dial: [phone]  Fax: [phone]  Website:  [email or web address]   12/19/2017 2:41 PM

## 2017-12-20 DIAGNOSIS — H10413 Chronic giant papillary conjunctivitis, bilateral: Secondary | ICD-10-CM | POA: Diagnosis not present

## 2017-12-20 DIAGNOSIS — H43811 Vitreous degeneration, right eye: Secondary | ICD-10-CM | POA: Diagnosis not present

## 2017-12-20 DIAGNOSIS — Z961 Presence of intraocular lens: Secondary | ICD-10-CM | POA: Diagnosis not present

## 2017-12-20 DIAGNOSIS — H401133 Primary open-angle glaucoma, bilateral, severe stage: Secondary | ICD-10-CM | POA: Diagnosis not present

## 2018-01-01 ENCOUNTER — Telehealth: Payer: Self-pay | Admitting: Internal Medicine

## 2018-01-01 ENCOUNTER — Other Ambulatory Visit: Payer: Self-pay | Admitting: *Deleted

## 2018-01-01 MED ORDER — FUROSEMIDE 20 MG PO TABS: 20.0000 mg | ORAL_TABLET | Freq: Every day | ORAL | 2 refills | Status: DC | PRN

## 2018-01-01 NOTE — Telephone Encounter (Unsigned)
Copied from [place]. Topic: Quick Communication - See Telephone Encounter >> Jan 01, 2018 11:05 AM Percell Belt A wrote: CRM for notification. See Telephone encounter for: pt called and needs a refill on is furosemide (LASIX) 20 MG tablet [[id number]].  He doesn't use it very often but needs it every now an then. He would like to know if Dr Raliegh Ip could call this in for him  [place]   01/01/18.

## 2018-01-01 NOTE — Telephone Encounter (Signed)
Lasix refill Last OV: 07/25/17 Last Refill:08/18/16 Pharmacy:[facility]

## 2018-01-02 ENCOUNTER — Other Ambulatory Visit: Payer: Self-pay | Admitting: Internal Medicine

## 2018-01-02 MED ORDER — FUROSEMIDE 20 MG PO TABS: 20.0000 mg | ORAL_TABLET | Freq: Every day | ORAL | 0 refills | Status: DC | PRN

## 2018-01-02 NOTE — Telephone Encounter (Signed)
Medication was e-scribed to the pharmacy as requested by pt. Pt was called and made aware.

## 2018-01-02 NOTE — Telephone Encounter (Signed)
Please advise 

## 2018-01-02 NOTE — Telephone Encounter (Signed)
Okay for refill?  

## 2018-01-15 ENCOUNTER — Other Ambulatory Visit: Payer: Self-pay | Admitting: Family Medicine

## 2018-02-19 ENCOUNTER — Other Ambulatory Visit: Payer: Self-pay | Admitting: Family Medicine

## 2018-02-21 ENCOUNTER — Other Ambulatory Visit: Payer: Self-pay | Admitting: Internal Medicine

## 2018-02-21 NOTE — Telephone Encounter (Signed)
REFILL 

## 2018-02-26 ENCOUNTER — Telehealth: Payer: Self-pay | Admitting: Internal Medicine

## 2018-02-26 NOTE — Telephone Encounter (Signed)
Copied from [place]. Topic: Inquiry >> Feb 26, 2018 11:37 AM Margot Ables wrote: Reason for CRM: Pt is needing a letter to get a refund stating he is not allowed to go on an airplane. He is trying to cancel a flight and they will not let him (May 11-May 16 from [place] to [facility]). Pt states he is no longer physically able. Please advise.

## 2018-02-27 ENCOUNTER — Telehealth: Payer: Self-pay | Admitting: Family Medicine

## 2018-02-27 NOTE — Telephone Encounter (Signed)
Copied from [place] [phone]. Topic: Inquiry >> Feb 27, 2018 12:13 PM Arletha Grippe wrote: Reason for CRM: pt hs 2 airline tickets for May. He is unable to travel and needs a letter stating such that he can not travel due to health.  [email or web address].  Please mail to home address if not able to mail. Please call when emailing to patient.  Cb is [phone]

## 2018-02-28 NOTE — Telephone Encounter (Signed)
Spoke to patient and he states that he needed the letter May 11th. I informed the patient that Dr.Kwiatkowki is out of the house unti wed. He verbalized understanding and will wait.

## 2018-03-01 NOTE — Telephone Encounter (Signed)
Letter printed to be revised and signed by [facility].

## 2018-03-02 NOTE — Telephone Encounter (Signed)
Also pt would like to schedule a physical if due with Dr. Elease Hashimoto, said he agreed to accept him & wife & son. I will write down names and ask Dr. Elease Hashimoto about this.

## 2018-03-05 NOTE — Telephone Encounter (Signed)
All okay 

## 2018-03-05 NOTE — Telephone Encounter (Signed)
OK with me.

## 2018-03-05 NOTE — Telephone Encounter (Signed)
Noted! Routing now to [facility] and Dr.Burchette for verification and possible agreement.

## 2018-03-06 NOTE — Telephone Encounter (Signed)
Left message to return call 

## 2018-03-06 NOTE — Telephone Encounter (Signed)
Noted  

## 2018-03-07 NOTE — Telephone Encounter (Signed)
Spoke to patient and he is informed if the update. Physical will be set up for Burchette. Letter mailed.

## 2018-03-07 NOTE — Telephone Encounter (Signed)
Patient set up for physical with Dr.Burchette. Patient notified of update.

## 2018-03-15 ENCOUNTER — Telehealth: Payer: Self-pay | Admitting: Family Medicine

## 2018-03-15 NOTE — Telephone Encounter (Signed)
Patient was informed that letter was sent out and he stated that he would rather pick it up in the office. I advised him that Dr.Kwiatkowski will be in the office on Monday. I informed the patient that if the letter haven't came in by Monday I will print one out to be signed for pickup. Patient also c/o of numbness in hands and feet and doesn't know if he should come in or be referred to a neurologist.

## 2018-03-15 NOTE — Telephone Encounter (Signed)
Patient is calling back to check on the status of getting a call back. Please advise.  [phone]

## 2018-03-15 NOTE — Telephone Encounter (Signed)
Copied from [place] [phone]. Topic: General - Other >> Mar 15, 2018  7:40 AM Lennox Solders wrote: Reason for CRM: pt would like dr k to return his call concerning a letter he never received and also pt is having finger numbness . Pt is requesting dr Raliegh Ip to call him back. Pt decline an appt

## 2018-03-16 NOTE — Telephone Encounter (Signed)
Office visit scheduled for 03/19/2018. Patient will also pick letter up.

## 2018-03-16 NOTE — Telephone Encounter (Signed)
Okay to schedule follow-up office visit.

## 2018-03-19 ENCOUNTER — Encounter: Payer: Self-pay | Admitting: Internal Medicine

## 2018-03-19 ENCOUNTER — Ambulatory Visit (INDEPENDENT_AMBULATORY_CARE_PROVIDER_SITE_OTHER): Payer: Medicare Other | Admitting: Internal Medicine

## 2018-03-19 VITALS — BP 100/64 | HR 82 | Temp 97.8°F | Wt 149.0 lb

## 2018-03-19 DIAGNOSIS — G5602 Carpal tunnel syndrome, left upper limb: Secondary | ICD-10-CM | POA: Diagnosis not present

## 2018-03-19 NOTE — Patient Instructions (Addendum)
Carpal Tunnel Syndrome Carpal tunnel syndrome is a condition that causes pain in your hand and arm. The carpal tunnel is a narrow area that is on the palm side of your wrist. Repeated wrist motion or certain diseases may cause swelling in the tunnel. This swelling can pinch the main nerve in the wrist (median nerve). Follow these instructions at home: If you have a splint:  Wear it as told by your doctor. Remove it only as told by your doctor.  Loosen the splint if your fingers: ? Become numb and tingle. ? Turn blue and cold.  Keep the splint clean and dry. General instructions  Take over-the-counter and prescription medicines only as told by your doctor.  Rest your wrist from any activity that may be causing your pain. If needed, talk to your employer about changes that can be made in your work, such as getting a wrist pad to use while typing.  If directed, apply ice to the painful area: ? Put ice in a plastic bag. ? Place a towel between your skin and the bag. ? Leave the ice on for 20 minutes, 2-3 times per day.  Keep all follow-up visits as told by your doctor. This is important.  Do any exercises as told by your doctor, physical therapist, or occupational therapist. Contact a doctor if:  You have new symptoms.  Medicine does not help your pain.  Your symptoms get worse.    How to care for your splint  Wear it as told by your doctor. Take it off only as told by your doctor.  Loosen the splint if your fingers or toes tingle, get numb, or turn cold and blue.  Keep the splint clean.

## 2018-03-19 NOTE — Progress Notes (Signed)
Subjective:    Patient ID: Robert Rowe, male    DOB: Jan 11, 1918, 84 y.o.   MRN: [id number]  HPI  84 year old patient who is seen today with a chief complaint of numbness involving his left second and third digits.  This has been a chronic problem and neurology suggested a left wrist splint for probable carpal tunnel syndrome.  He tried this for couple weeks but no longer uses a splint.  Denies any significant pain or loss of grip strength Other complaints include forgetfulness   Past Medical History:  Diagnosis Date  . BENIGN PROSTATIC HYPERTROPHY 04/05/2007  . 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 CHEST DISCOMFORT 04/25/2009  . Chronic diastolic CHF (congestive heart failure) ([place])    a. Echo 12/16: mild LVH, EF 55-60%, no RWMA, Gr 1 DD, mild AI, MAC, mild LAE, normal RVF, PASP 37 mmHg  . Chronic kidney disease    chronic   . DJD (degenerative joint disease)   . Dysrhythmia    pvc's   . GERD 12/03/2007  . Hiatal hernia   . History of nuclear stress test    Myoview 1/17: EF 53%, normal perfusion; Low Risk  . HYPERLIPIDEMIA 04/05/2007  . Hypothyroidism   . Osteoarth NOS-Unspec 04/05/2007  . Palpitations 02/12/2010  . PEDAL EDEMA 05/01/2008  . Rheumatoid arthritis(714.0) 04/05/2007  . Shortness of breath dyspnea   . TRANSIENT ISCHEMIC ATTACK, HX OF 04/05/2007   ?      Social History   Socioeconomic History  . Marital status: Married    Spouse name: Not on file  . Number of children: Not on file  . Years of education: Not on file  . Highest education level: Not on file  Occupational History  . Not on file  Social Needs  . Financial resource strain: Not on file  . Food insecurity:    Worry: Not on file    Inability: Not on file  . Transportation needs:    Medical: Not on file    Non-medical: Not on file  Tobacco Use  . Smoking status: Former Smoker    Types: Cigarettes    Last attempt to quit: 08/13/1953    Years since quitting: 64.6  . Smokeless  tobacco: Never Used  Substance and Sexual Activity  . Alcohol use: Yes    Comment: occ vodka  . Drug use: No  . Sexual activity: Not on file  Lifestyle  . Physical activity:    Days per week: Not on file    Minutes per session: Not on file  . Stress: Not on file  Relationships  . Social connections:    Talks on phone: Not on file    Gets together: Not on file    Attends religious service: Not on file    Active member of club or organization: Not on file    Attends meetings of clubs or organizations: Not on file    Relationship status: Not on file  . Intimate partner violence:    Fear of current or ex partner: Not on file    Emotionally abused: Not on file    Physically abused: Not on file    Forced sexual activity: Not on file  Other Topics Concern  . Not on file  Social History Narrative  . Not on file    Past Surgical History:  Procedure Laterality Date  . BALLOON DILATION N/A 03/12/2013   Procedure: BALLOON DILATION;  Surgeon: Inda Castle, MD;  Location: [facility];  Service: Endoscopy;  Laterality: N/A;  . CATARACT EXTRACTION  2001   bilateral  . CYSTOSCOPY WITH URETHRAL DILATATION N/A 09/24/2015   Procedure: CYSTOSCOPY WITH COOK BALLOON DILATATION OF URETHRAL STRICTURE ;  Surgeon: Carolan Clines, MD;  Location: [facility];  Service: Urology;  Laterality: N/A;  . CYSTOSCOPY WITH URETHRAL DILATATION N/A 07/20/2016   Procedure: CYSTOSCOPY WITH URETHRAL DILATATION;  Surgeon: Alexis Frock, MD;  Location: [facility];  Service: Urology;  Laterality: N/A;  1 HOUR  . ESOPHAGOGASTRODUODENOSCOPY N/A 03/12/2013   Procedure: ESOPHAGOGASTRODUODENOSCOPY (EGD);  Surgeon: Inda Castle, MD;  Location: [facility];  Service: Endoscopy;  Laterality: N/A;  . HEMORRHOID SURGERY    . HERNIA REPAIR     inguinal x5  . KNEE SURGERY  arthroscopic   left  . PROSTATE CRYOABLATION     2000  . SHOULDER SURGERY  few years ago   left  . TRANSURETHRAL RESECTION OF PROSTATE N/A 03/19/2015    Procedure: TRANSURETHRAL RESECTION OF THE PROSTATE (TURP);  Surgeon: Carolan Clines, MD;  Location: [facility];  Service: Urology;  Laterality: N/A;    Family History  Problem Relation Age of Onset  . Breast cancer Mother   . Heart disease Father   . Heart attack Father   . Lymphoma Brother   . Stroke Neg Hx   . Hypertension Neg Hx     Allergies  Allergen Reactions  . Sulfonamide Derivatives Other (See Comments)    Patient does not remember allergy symptoms    Current Outpatient Medications on File Prior to Visit  Medication Sig Dispense Refill  . clopidogrel (PLAVIX) 75 MG tablet Take 1 tablet (75 mg total) by mouth daily. 90 tablet 1  . furosemide (LASIX) 20 MG tablet Take 1 tablet (20 mg total) by mouth daily as needed for fluid or edema. 90 tablet 0  . latanoprost (XALATAN) 0.005 % ophthalmic solution Instill 1 drop into both eyes at bedtime 50/2=25  99  . levothyroxine (SYNTHROID, LEVOTHROID) 50 MCG tablet TAKE 1 TABLET (50 MCG TOTAL) BY MOUTH DAILY. 90 tablet 1  . metoprolol succinate (TOPROL-XL) 25 MG 24 hr tablet Take 1 tablet (25 mg total) by mouth at bedtime. 90 tablet 1  . Multiple Vitamin (MULTIVITAMIN WITH MINERALS) TABS tablet Take 1 tablet by mouth 3 (three) times a week.    . neomycin-polymyxin b-dexamethasone (MAXITROL) 3.5-10000-0.1 SUSP Place 2 drops into both eyes 4 (four) times daily. 5 mL 3  . nitroGLYCERIN (NITROSTAT) 0.4 MG SL tablet Place 0.4 mg under the tongue every 5 (five) minutes as needed (ESOPHEAGEAL SPASPMS). Reported on 03/01/2016    . pantoprazole (PROTONIX) 40 MG tablet Take 1 tablet (40 mg total) by mouth daily. 90 tablet 1   No current facility-administered medications on file prior to visit.     Pulse 82   Temp 97.8 F (36.6 C) (Oral)   Wt 149 lb (67.6 kg)   SpO2 99%   BMI 27.25 kg/m     Review of Systems    Numbness of the left second and third digits Forgetfulness.  Chronic complaint  Objective:   Physical Exam  Blood  pressure 100/64  Grip strength fairly symmetrical Decreased sensation involving the left second and third digits  negative Tinel'          Assessment & Plan:    Impression probable left carpal tunnel syndrome.  Options discussed including EMG.  Have elected to try more conscientious use of the wrist splint and observe at  this time Cerebrovascular disease  Patient report any clinical worsening or new symptoms  follow-up as needed.  Patient does have a follow-up visit scheduled with a new provider  Nyoka Cowden

## 2018-04-23 ENCOUNTER — Other Ambulatory Visit: Payer: Self-pay | Admitting: Internal Medicine

## 2018-04-23 NOTE — Telephone Encounter (Signed)
Checking status, requesting 90 day supply with additional refills

## 2018-04-25 DIAGNOSIS — H401133 Primary open-angle glaucoma, bilateral, severe stage: Secondary | ICD-10-CM | POA: Diagnosis not present

## 2018-05-07 ENCOUNTER — Encounter: Payer: Medicare Other | Admitting: Internal Medicine

## 2018-05-07 ENCOUNTER — Encounter: Payer: Medicare Other | Admitting: Family Medicine

## 2018-05-08 ENCOUNTER — Ambulatory Visit (INDEPENDENT_AMBULATORY_CARE_PROVIDER_SITE_OTHER): Payer: Medicare Other | Admitting: Family Medicine

## 2018-05-08 ENCOUNTER — Other Ambulatory Visit: Payer: Self-pay

## 2018-05-08 ENCOUNTER — Encounter: Payer: Self-pay | Admitting: Family Medicine

## 2018-05-08 VITALS — BP 110/70 | HR 80 | Temp 97.6°F | Wt 147.6 lb

## 2018-05-08 DIAGNOSIS — K219 Gastro-esophageal reflux disease without esophagitis: Secondary | ICD-10-CM | POA: Diagnosis not present

## 2018-05-08 DIAGNOSIS — N183 Chronic kidney disease, stage 3 unspecified: Secondary | ICD-10-CM

## 2018-05-08 DIAGNOSIS — I10 Essential (primary) hypertension: Secondary | ICD-10-CM | POA: Diagnosis not present

## 2018-05-08 DIAGNOSIS — I493 Ventricular premature depolarization: Secondary | ICD-10-CM

## 2018-05-08 DIAGNOSIS — I5032 Chronic diastolic (congestive) heart failure: Secondary | ICD-10-CM

## 2018-05-08 DIAGNOSIS — M1990 Unspecified osteoarthritis, unspecified site: Secondary | ICD-10-CM

## 2018-05-08 DIAGNOSIS — E039 Hypothyroidism, unspecified: Secondary | ICD-10-CM | POA: Diagnosis not present

## 2018-05-08 DIAGNOSIS — L570 Actinic keratosis: Secondary | ICD-10-CM | POA: Diagnosis not present

## 2018-05-08 NOTE — Patient Outreach (Addendum)
[place] [facility]) Care Management  05/08/2018  Robert Rowe 25-Dec-1917 [id number]   Telephone Screen  Referral Date: 05/08/18 Referral Source: EMMI Prevent Referral Reason: "A-fib" Insurance: [facility] Medicare   Outreach attempt # 1 to patient. No answer at present and unable to leave message.      Plan: RN CM will make outreach attempt to patient within three business days. RN CM did not send letter as [facility] letter has already been sent to patient today.   [name], RN,BSN,CCM [place] Management Telephonic Care Management Coordinator Direct Phone: [phone] Toll Free: [phone] Fax: [phone]

## 2018-05-08 NOTE — Progress Notes (Signed)
Subjective:     Patient ID: Robert Rowe, male   DOB: 02/04/1918, 84 y.o.   MRN: [id number]  HPI Patient seen to establish with me. He has been followed by Dr. Raliegh Ip for many years. He is not new to this practice but new to me.  Chronic problems include history of PVCs, chronic diastolic heart failure, reported hypertension (though patient denies), GERD, history of esophageal stricture, hypothyroidism, osteoarthritis, chronic kidney disease  Medications reviewed. He is fairly inactive. He has a walker at home but does not use this regularly. He has not had any falls about a year and half. Reflux fairly well controlled with pantoprazole. He has some chronic mild lower extremity edema. Does not take furosemide very often. No recent chest pains. No dyspnea.  Past Medical History:  Diagnosis Date  . BENIGN PROSTATIC HYPERTROPHY 04/05/2007  . 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 CHEST DISCOMFORT 04/25/2009  . Chronic diastolic CHF (congestive heart failure) ([place])    a. Echo 12/16: mild LVH, EF 55-60%, no RWMA, Gr 1 DD, mild AI, MAC, mild LAE, normal RVF, PASP 37 mmHg  . Chronic kidney disease    chronic   . DJD (degenerative joint disease)   . Dysrhythmia    pvc's   . GERD 12/03/2007  . Hiatal hernia   . History of nuclear stress test    Myoview 1/17: EF 53%, normal perfusion; Low Risk  . HYPERLIPIDEMIA 04/05/2007  . Hypothyroidism   . Osteoarth NOS-Unspec 04/05/2007  . Palpitations 02/12/2010  . PEDAL EDEMA 05/01/2008  . Rheumatoid arthritis(714.0) 04/05/2007  . Shortness of breath dyspnea   . TRANSIENT ISCHEMIC ATTACK, HX OF 04/05/2007   ?    Past Surgical History:  Procedure Laterality Date  . BALLOON DILATION N/A 03/12/2013   Procedure: BALLOON DILATION;  Surgeon: Inda Castle, MD;  Location: [facility];  Service: Endoscopy;  Laterality: N/A;  . CATARACT EXTRACTION  2001   bilateral  . CYSTOSCOPY WITH URETHRAL DILATATION N/A 09/24/2015   Procedure: CYSTOSCOPY  WITH COOK BALLOON DILATATION OF URETHRAL STRICTURE ;  Surgeon: Carolan Clines, MD;  [facility];  Service: Urology;  Laterality: N/A;  . CYSTOSCOPY WITH URETHRAL DILATATION N/A 07/20/2016   Procedure: CYSTOSCOPY WITH URETHRAL DILATATION;  Surgeon: Alexis Frock, MD;  Location[facility];  Service: Urology;  Laterality: N/A;  1 HOUR  . ESOPHAGOGASTRODUODENOSCOPY N/A 03/12/2013   Procedure: ESOPHAGOGASTRODUODENOSCOPY (EGD);  Surgeon: Inda Castle, MD;  Location: [facility];  Service: Endoscopy;  Laterality: N/A;  . HEMORRHOID SURGERY    . HERNIA REPAIR     inguinal x5  . KNEE SURGERY  arthroscopic   left  . PROSTATE CRYOABLATION     2000  . SHOULDER SURGERY  few years ago   left  . TRANSURETHRAL RESECTION OF PROSTATE N/A 03/19/2015   Procedure: TRANSURETHRAL RESECTION OF THE PROSTATE (TURP);  Surgeon: Carolan Clines, MD;  [facility];  Service: Urology;  Laterality: N/A;    reports that he quit smoking about 64 years ago. His smoking use included cigarettes. He has never used smokeless tobacco. He reports that he drinks alcohol. He reports that he does not use drugs. family history includes Breast cancer in his mother; Heart attack in his father; Heart disease in his father; Lymphoma in his brother. Allergies  Allergen Reactions  . Sulfonamide Derivatives Other (See Comments)    Patient does not remember allergy symptoms     Review of Systems  Constitutional: Negative  for appetite change, fatigue and unexpected weight change.  Eyes: Negative for visual disturbance.  Respiratory: Negative for cough, chest tightness and shortness of breath.   Cardiovascular: Negative for chest pain, palpitations and leg swelling.  Gastrointestinal: Negative for abdominal pain.  Endocrine: Negative for polydipsia and polyuria.  Genitourinary: Negative for dysuria.  Neurological: Negative for dizziness, syncope, weakness, light-headedness and headaches.       Objective:   Physical  Exam  Constitutional: He is oriented to person, place, and time. He appears well-developed and well-nourished.  HENT:  Right Ear: External ear normal.  Left Ear: External ear normal.  Mouth/Throat: Oropharynx is clear and moist.  Eyes: Pupils are equal, round, and reactive to light.  Neck: Neck supple. No thyromegaly present.  Cardiovascular: Normal rate and regular rhythm.  Pulmonary/Chest: Effort normal and breath sounds normal. No respiratory distress. He has no wheezes. He has no rales.  Musculoskeletal: He exhibits no edema.  Neurological: He is alert and oriented to person, place, and time.  Skin:  Thickened hyperkeratotic area scalp mid parietal area-approximately 1/2 cm diameter       Assessment:     #1 history of diastolic heart failure symptomatically stable  #2 history of PVCs controlled with metoprolol  #3 history of GERD. Past history of esophageal stricture. No current dysphagia  #4 actinic keratosis scalp  #5 hypothyroidism  #6 chronic kidney disease    Plan:     -Discuss risk and benefits of treatment with liquid nitrogen to actinic keratosis on the scalp and patient consented. This was treated without difficulty -Continue current medications -We'll plan routine follow-up in 3 months and obtain labs at that point including renal function and TSH  Robert Post MD [facility]

## 2018-05-10 ENCOUNTER — Other Ambulatory Visit: Payer: Self-pay

## 2018-05-10 NOTE — Patient Outreach (Signed)
[place] [facility]) Care Management  05/10/2018  Robert Rowe 20-Aug-1918 [id number]   Telephone Screen  Referral Date: 05/08/18 Referral Source: EMMI Prevent Referral Reason: "A-fib" Insurance: [facility] Medicare   Outreach attempt #2 to patient. No answer at present and unable to leave message.    Plan: RN CM will make outreach attempt to patient within three business days.   [name], RN,BSN,CCM [place] Management Telephonic Care Management Coordinator Direct Phone: [phone] Toll Free: [phone] Fax: [phone]

## 2018-05-11 ENCOUNTER — Other Ambulatory Visit: Payer: Self-pay

## 2018-05-11 ENCOUNTER — Telehealth: Payer: Self-pay | Admitting: Family Medicine

## 2018-05-11 MED ORDER — METOPROLOL SUCCINATE ER 25 MG PO TB24: 25.0000 mg | ORAL_TABLET | Freq: Every day | ORAL | 1 refills | Status: DC

## 2018-05-11 MED ORDER — CLOPIDOGREL BISULFATE 75 MG PO TABS: 75.0000 mg | ORAL_TABLET | Freq: Every day | ORAL | 1 refills | Status: DC

## 2018-05-11 MED ORDER — PANTOPRAZOLE SODIUM 40 MG PO TBEC: 40.0000 mg | DELAYED_RELEASE_TABLET | Freq: Every day | ORAL | 1 refills | Status: DC

## 2018-05-11 NOTE — Telephone Encounter (Signed)
Copied from [place] [id number]. Topic: Quick Communication - Rx Refill/Question >> May 11, 2018  3:22 PM Burchel, Abbi R wrote: Medication: clopidogrel (PLAVIX) 75 MG tablet  metoprolol succinate (TOPROL-XL) 25 MG 24 hr tablet pantoprazole (PROTONIX) 40 MG tablet  Preferred Pharmacy (with phone number or street name): [facility], [place]  [phone] (Phone) [phone] (Fax)  Pilar from [facility] called to get approval for these Rx.   Call Back #: [phone] Reference/Order #: [id number]      Agent: Please be advised that RX refills may take up to 3 business days. We ask that you follow-up with your pharmacy.

## 2018-05-11 NOTE — Telephone Encounter (Signed)
Rx sent 

## 2018-05-11 NOTE — Patient Outreach (Signed)
[facility] [facility]) Care Management  05/11/2018  Robert Rowe 1917/12/22 [id number]   Telephone Screen  Referral Date:05/08/18 Referral Source:EMMI Prevent Referral Reason:"A-fib" Insurance:[employer] Medicare   Outreach attempt #3 to patient. No answer at present.      Plan: RN CM will close case if no response to letter.   [name], RN,BSN,CCM [place] Management Telephonic Care Management Coordinator Direct Phone: [phone] Toll Free: [phone] Fax: [phone]

## 2018-05-14 ENCOUNTER — Emergency Department (HOSPITAL_COMMUNITY): Payer: Medicare Other

## 2018-05-14 ENCOUNTER — Observation Stay (HOSPITAL_COMMUNITY)
Admission: EM | Admit: 2018-05-14 | Discharge: 2018-05-15 | Disposition: A | Discharge status: Home / Self Care | Payer: Medicare Other | Attending: Internal Medicine | Admitting: Internal Medicine

## 2018-05-14 ENCOUNTER — Encounter (HOSPITAL_COMMUNITY): Payer: Self-pay | Admitting: Emergency Medicine

## 2018-05-14 DIAGNOSIS — E785 Hyperlipidemia, unspecified: Secondary | ICD-10-CM | POA: Diagnosis not present

## 2018-05-14 DIAGNOSIS — I10 Essential (primary) hypertension: Secondary | ICD-10-CM | POA: Diagnosis not present

## 2018-05-14 DIAGNOSIS — I5032 Chronic diastolic (congestive) heart failure: Secondary | ICD-10-CM | POA: Diagnosis not present

## 2018-05-14 DIAGNOSIS — E039 Hypothyroidism, unspecified: Secondary | ICD-10-CM | POA: Insufficient documentation

## 2018-05-14 DIAGNOSIS — M069 Rheumatoid arthritis, unspecified: Secondary | ICD-10-CM | POA: Diagnosis not present

## 2018-05-14 DIAGNOSIS — I130 Hypertensive heart and chronic kidney disease with heart failure and stage 1 through stage 4 chronic kidney disease, or unspecified chronic kidney disease: Secondary | ICD-10-CM | POA: Insufficient documentation

## 2018-05-14 DIAGNOSIS — Z7901 Long term (current) use of anticoagulants: Secondary | ICD-10-CM | POA: Diagnosis not present

## 2018-05-14 DIAGNOSIS — R402441 Other coma, without documented Glasgow coma scale score, or with partial score reported, in the field [EMT or ambulance]: Secondary | ICD-10-CM | POA: Diagnosis not present

## 2018-05-14 DIAGNOSIS — N183 Chronic kidney disease, stage 3 (moderate): Secondary | ICD-10-CM | POA: Diagnosis not present

## 2018-05-14 DIAGNOSIS — Z8249 Family history of ischemic heart disease and other diseases of the circulatory system: Secondary | ICD-10-CM | POA: Insufficient documentation

## 2018-05-14 DIAGNOSIS — M47815 Spondylosis without myelopathy or radiculopathy, thoracolumbar region: Secondary | ICD-10-CM | POA: Insufficient documentation

## 2018-05-14 DIAGNOSIS — I5030 Unspecified diastolic (congestive) heart failure: Secondary | ICD-10-CM | POA: Diagnosis not present

## 2018-05-14 DIAGNOSIS — Z79899 Other long term (current) drug therapy: Secondary | ICD-10-CM | POA: Diagnosis not present

## 2018-05-14 DIAGNOSIS — J9811 Atelectasis: Secondary | ICD-10-CM | POA: Diagnosis not present

## 2018-05-14 DIAGNOSIS — R1011 Right upper quadrant pain: Secondary | ICD-10-CM | POA: Diagnosis not present

## 2018-05-14 DIAGNOSIS — H5462 Unqualified visual loss, left eye, normal vision right eye: Secondary | ICD-10-CM | POA: Diagnosis not present

## 2018-05-14 DIAGNOSIS — R079 Chest pain, unspecified: Secondary | ICD-10-CM | POA: Diagnosis present

## 2018-05-14 DIAGNOSIS — Z87891 Personal history of nicotine dependence: Secondary | ICD-10-CM | POA: Diagnosis not present

## 2018-05-14 DIAGNOSIS — Z79890 Hormone replacement therapy (postmenopausal): Secondary | ICD-10-CM | POA: Insufficient documentation

## 2018-05-14 DIAGNOSIS — Z8673 Personal history of transient ischemic attack (TIA), and cerebral infarction without residual deficits: Secondary | ICD-10-CM | POA: Insufficient documentation

## 2018-05-14 DIAGNOSIS — Z9889 Other specified postprocedural states: Secondary | ICD-10-CM | POA: Insufficient documentation

## 2018-05-14 DIAGNOSIS — K5730 Diverticulosis of large intestine without perforation or abscess without bleeding: Secondary | ICD-10-CM | POA: Diagnosis not present

## 2018-05-14 DIAGNOSIS — M5136 Other intervertebral disc degeneration, lumbar region: Secondary | ICD-10-CM | POA: Insufficient documentation

## 2018-05-14 DIAGNOSIS — N281 Cyst of kidney, acquired: Secondary | ICD-10-CM | POA: Insufficient documentation

## 2018-05-14 DIAGNOSIS — R0789 Other chest pain: Secondary | ICD-10-CM | POA: Insufficient documentation

## 2018-05-14 DIAGNOSIS — R0902 Hypoxemia: Secondary | ICD-10-CM | POA: Diagnosis not present

## 2018-05-14 DIAGNOSIS — Z882 Allergy status to sulfonamides status: Secondary | ICD-10-CM | POA: Diagnosis not present

## 2018-05-14 DIAGNOSIS — Z9842 Cataract extraction status, left eye: Secondary | ICD-10-CM | POA: Insufficient documentation

## 2018-05-14 DIAGNOSIS — R918 Other nonspecific abnormal finding of lung field: Secondary | ICD-10-CM | POA: Diagnosis not present

## 2018-05-14 DIAGNOSIS — K219 Gastro-esophageal reflux disease without esophagitis: Secondary | ICD-10-CM | POA: Insufficient documentation

## 2018-05-14 DIAGNOSIS — I700 Atherosclerosis of aorta: Secondary | ICD-10-CM | POA: Diagnosis not present

## 2018-05-14 DIAGNOSIS — Z9841 Cataract extraction status, right eye: Secondary | ICD-10-CM | POA: Insufficient documentation

## 2018-05-14 LAB — I-STAT TROPONIN, ED: Troponin i, poc: 0 ng/mL (ref 0.00–0.08)

## 2018-05-14 LAB — HEPATIC FUNCTION PANEL
ALT: 13 U/L — ABNORMAL LOW (ref 17–63)
AST: 16 U/L (ref 15–41)
Albumin: 3.6 g/dL (ref 3.5–5.0)
Alkaline Phosphatase: 72 U/L (ref 38–126)
Bilirubin, Direct: 0.1 mg/dL (ref 0.1–0.5)
Indirect Bilirubin: 0.5 mg/dL (ref 0.3–0.9)
Total Bilirubin: 0.6 mg/dL (ref 0.3–1.2)
Total Protein: 6.2 g/dL — ABNORMAL LOW (ref 6.5–8.1)

## 2018-05-14 LAB — BASIC METABOLIC PANEL
Anion gap: 8 (ref 5–15)
BUN: 20 mg/dL (ref 6–20)
CO2: 25 mmol/L (ref 22–32)
Calcium: 9.1 mg/dL (ref 8.9–10.3)
Chloride: 105 mmol/L (ref 101–111)
Creatinine, Ser: 1.69 mg/dL — ABNORMAL HIGH (ref 0.61–1.24)
GFR calc Af Amer: 37 mL/min — ABNORMAL LOW (ref >60)
GFR calc non Af Amer: 32 mL/min — ABNORMAL LOW (ref >60)
Glucose, Bld: 164 mg/dL — ABNORMAL HIGH (ref 65–99)
Potassium: 4.1 mmol/L (ref 3.5–5.1)
Sodium: 138 mmol/L (ref 135–145)

## 2018-05-14 LAB — CBC
HCT: 45.3 % (ref 39.0–52.0)
Hemoglobin: 14.4 g/dL (ref 13.0–17.0)
MCH: 31.4 pg (ref 26.0–34.0)
MCHC: 31.8 g/dL (ref 30.0–36.0)
MCV: 98.7 fL (ref 78.0–100.0)
Platelets: 205 10*3/uL (ref 150–400)
RBC: 4.59 MIL/uL (ref 4.22–5.81)
RDW: 12.5 % (ref 11.5–15.5)
WBC: 6.9 10*3/uL (ref 4.0–10.5)

## 2018-05-14 LAB — LIPASE, BLOOD: Lipase: 44 U/L (ref 11–51)

## 2018-05-14 MED ORDER — ASPIRIN 81 MG PO CHEW
324.0000 mg | CHEWABLE_TABLET | Freq: Once | ORAL | Status: AC
  Administered 2018-05-14: 324 mg via ORAL
  Filled 2018-05-14: qty 4

## 2018-05-14 MED ORDER — MORPHINE SULFATE (PF) 4 MG/ML IV SOLN
4.0000 mg | Freq: Once | INTRAVENOUS | Status: AC
  Administered 2018-05-14: 4 mg via INTRAVENOUS
  Filled 2018-05-14: qty 1

## 2018-05-14 MED ORDER — ONDANSETRON HCL 4 MG/2ML IJ SOLN
4.0000 mg | Freq: Once | INTRAMUSCULAR | Status: AC
  Administered 2018-05-14: 4 mg via INTRAVENOUS
  Filled 2018-05-14: qty 2

## 2018-05-14 NOTE — ED Provider Notes (Signed)
[facility] Provider Note   CSN: [id number] Arrival date & time: 05/14/18  2134     History   Chief Complaint Chief Complaint  Patient presents with  . Chest Pain    HPI Robert Rowe is a 84 y.o. male with history of PVCs, hyperlipidemia, hiatal hernia, esophageal spasms, rheumatoid arthritis, TIA, kidney insufficiency, GERD here for evaluation of chest pain.  Patient points to right upper quadrant abdominal area.  Pain is worse with palpation of this area.  Onset while patient was sitting down, at rest.  Pain was moderate, nonradiating, nonpleuritic.  He took 2 nitroglycerin and pain initially improved however now starting to return.  Initially thought he had esophageal spasm however pain was much worse and different.  He denies any fevers, chills, cough, nausea, vomiting.  Had a in 2 to 3 hours prior to symptom onset.  Has chronic bilateral lower extremity swelling unchanged.  Takes furosemide as needed.  Chart review reveals patient had a normal stress test in 2017 and echo with EF 55-65% in 2016. Pt is a retired Engineer, drilling.   HPI  Past Medical History:  Diagnosis Date  . BENIGN PROSTATIC HYPERTROPHY 04/05/2007  . 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 CHEST DISCOMFORT 04/25/2009  . Chronic diastolic CHF (congestive heart failure) ([place])    a. Echo 12/16: mild LVH, EF 55-60%, no RWMA, Gr 1 DD, mild AI, MAC, mild LAE, normal RVF, PASP 37 mmHg  . Chronic kidney disease    chronic   . DJD (degenerative joint disease)   . Dysrhythmia    pvc's   . GERD 12/03/2007  . Hiatal hernia   . History of nuclear stress test    Myoview 1/17: EF 53%, normal perfusion; Low Risk  . HYPERLIPIDEMIA 04/05/2007  . Hypothyroidism   . Osteoarth NOS-Unspec 04/05/2007  . Palpitations 02/12/2010  . PEDAL EDEMA 05/01/2008  . Rheumatoid arthritis(714.0) 04/05/2007  . Shortness of breath dyspnea   . TRANSIENT ISCHEMIC ATTACK, HX OF 04/05/2007   ?      Patient Active Problem List   Diagnosis Date Noted  . CKD (chronic kidney disease) stage 3, GFR 30-59 ml/min (HCC) 05/08/2018  . Other fatigue 06/13/2017  . Abnormal diffusion capacity determined by pulmonary function test 06/13/2017  . Chronic diastolic CHF (congestive heart failure) ([place]) 11/01/2016  . Essential hypertension 11/01/2016  . Intracranial vascular stenosis 11/09/2015  . Mild cognitive impairment 11/09/2015  . 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 Benign prostatic hyperplasia with urinary obstruction 03/19/2015  . Awareness alteration, transient 02/26/2015  . PVC's (premature ventricular contractions) 08/22/2013  . Hypothyroidism 05/09/2013  . Stricture and stenosis of esophagus 03/12/2013  . Dysphagia, unspecified(787.20) 02/22/2013  . DOE (dyspnea on exertion) 03/23/2011  . PALPITATIONS 02/12/2010  . CHEST DISCOMFORT 04/25/2009  . PEDAL EDEMA 05/01/2008  . GERD 12/03/2007  . Dyslipidemia 04/05/2007  . BENIGN PROSTATIC HYPERTROPHY 04/05/2007  . Rheumatoid arthritis ([place]) 04/05/2007  . Osteoarthritis 04/05/2007  . History of cardiovascular disorder 04/05/2007    Past Surgical History:  Procedure Laterality Date  . BALLOON DILATION N/A 03/12/2013   Procedure: BALLOON DILATION;  Surgeon: Inda Castle, MD;  Location: [facility];  Service: Endoscopy;  Laterality: N/A;  . CATARACT EXTRACTION  2001   bilateral  . CYSTOSCOPY WITH URETHRAL DILATATION N/A 09/24/2015   Procedure: CYSTOSCOPY WITH COOK BALLOON DILATATION OF URETHRAL STRICTURE ;  Surgeon: Carolan Clines, MD;  Location: [facility];  Service: Urology;  Laterality: N/A;  . CYSTOSCOPY WITH URETHRAL DILATATION N/A 07/20/2016   Procedure: CYSTOSCOPY WITH URETHRAL DILATATION;  Surgeon: Alexis Frock, MD;  Location: [facility];  Service: Urology;  Laterality: N/A;  1 HOUR  . ESOPHAGOGASTRODUODENOSCOPY N/A 03/12/2013   Procedure: ESOPHAGOGASTRODUODENOSCOPY (EGD);  Surgeon: Inda Castle, MD;  Location: [facility];  Service: Endoscopy;  Laterality: N/A;  . HEMORRHOID SURGERY    . HERNIA REPAIR     inguinal x5  . KNEE SURGERY  arthroscopic   left  . PROSTATE CRYOABLATION     2000  . SHOULDER SURGERY  few years ago   left  . TRANSURETHRAL RESECTION OF PROSTATE N/A 03/19/2015   Procedure: TRANSURETHRAL RESECTION OF THE PROSTATE (TURP);  Surgeon: Carolan Clines, MD;  Location: [facility];  Service: Urology;  Laterality: N/A;        Home Medications    Prior to Admission medications   Medication Sig Start Date End Date Taking? Authorizing Provider  clopidogrel (PLAVIX) 75 MG tablet Take 1 tablet (75 mg total) by mouth daily. 05/11/18   Burchette, Alinda Sierras, MD  furosemide (LASIX) 20 MG tablet Take 1 tablet (20 mg total) by mouth daily as needed for fluid or edema. 01/02/18   Marletta Lor, MD  latanoprost (XALATAN) 0.005 % ophthalmic solution Instill 1 drop into both eyes at bedtime 50/2=25 02/08/18   [provider]  levothyroxine (SYNTHROID, LEVOTHROID) 50 MCG tablet TAKE 1 TABLET (50 MCG TOTAL) BY MOUTH DAILY. 08/30/17   Marletta Lor, MD  metoprolol succinate (TOPROL-XL) 25 MG 24 hr tablet Take 1 tablet (25 mg total) by mouth at bedtime. 05/11/18   Burchette, Alinda Sierras, MD  Multiple Vitamin (MULTIVITAMIN WITH MINERALS) TABS tablet Take 1 tablet by mouth 3 (three) times a week.    [provider]  neomycin-polymyxin b-dexamethasone (MAXITROL) 3.5-10000-0.1 SUSP Place 2 drops into both eyes 4 (four) times daily. 10/20/17   Marletta Lor, MD  nitroGLYCERIN (NITROSTAT) 0.4 MG SL tablet Place 0.4 mg under the tongue every 5 (five) minutes as needed (ESOPHEAGEAL SPASPMS). Reported on 03/01/2016    [provider]  pantoprazole (PROTONIX) 40 MG tablet Take 1 tablet (40 mg total) by mouth daily. 05/11/18   Burchette, Alinda Sierras, MD    Family History Family History  Problem Relation Age of Onset  . Breast cancer Mother   . Heart disease Father   . Heart  attack Father   . Lymphoma Brother   . Stroke Neg Hx   . Hypertension Neg Hx     Social History Social History   Tobacco Use  . Smoking status: Former Smoker    Types: Cigarettes    Last attempt to quit: 08/13/1953    Years since quitting: 64.7  . Smokeless tobacco: Never Used  Substance Use Topics  . Alcohol use: Yes    Comment: occ vodka  . Drug use: No     Allergies   Sulfonamide derivatives   Review of Systems Review of Systems  Gastrointestinal: Positive for abdominal pain.  All other systems reviewed and are negative.    Physical Exam Updated Vital Signs BP 123/82   Pulse 82   Resp 13   Ht _0  (1.575 m)   Wt 65.8 kg (145 lb)   SpO2 93%   BMI 26.52 kg/m   Physical Exam  Constitutional: He is oriented to person, place, and time. He appears well-developed and well-nourished. No distress.  Non toxic. Good historian.   HENT:  Head: Normocephalic and atraumatic.  Nose: Nose normal.  Moist mucous membranes   Eyes: Pupils are equal, round, and reactive to light. Conjunctivae and EOM are normal.  Neck: Normal range of motion.  Cardiovascular: Normal rate, regular rhythm, normal heart sounds and intact distal pulses.  2+ pitting edema up to knees, symmetric. No calf tenderness or asymmetry. 2+ DP and radial pulses bilaterally.   Pulmonary/Chest: Effort normal and breath sounds normal.  Abdominal: Soft. Bowel sounds are normal. There is tenderness.  RUQ tenderness with guarding. Positive Murphy's. No obvious distention. No R/R. No suprapubic or CVA tenderness.   Musculoskeletal: Normal range of motion.  Neurological: He is alert and oriented to person, place, and time.  Skin: Skin is warm and dry. Capillary refill takes less than 2 seconds.  Psychiatric: He has a normal mood and affect. His behavior is normal. Judgment and thought content normal.  Nursing note and vitals reviewed.    ED Treatments / Results  Labs (all labs ordered are listed, but only  abnormal results are displayed) Labs Reviewed  BASIC METABOLIC PANEL - Abnormal; Notable for the following components:      Result Value   Glucose, Bld 164 (*)    Creatinine, Ser 1.69 (*)    GFR calc non Af Amer 32 (*)    GFR calc Af Amer 37 (*)    All other components within normal limits  HEPATIC FUNCTION PANEL - Abnormal; Notable for the following components:   Total Protein 6.2 (*)    ALT 13 (*)    All other components within normal limits  CBC  LIPASE, BLOOD  I-STAT TROPONIN, ED    EKG EKG Interpretation  Date/Time:  Monday May 14 2018 21:39:05 EDT Ventricular Rate:  82 PR Interval:    QRS Duration: 80 QT Interval:  397 QTC Calculation: 464 R Axis:   63 Text Interpretation:  Sinus rhythm Confirmed by Isla Pence [phone]) on 05/14/2018 9:44:43 PM   Radiology Dg Chest 2 View  Result Date: 05/14/2018 CLINICAL DATA:  Chest pain and abdominal pain x1 day. EXAM: CHEST - 2 VIEW COMPARISON:  11/15/2015 CXR FINDINGS: Stable cardiomegaly with mild aortic atherosclerosis. Air like lucency projects over the cardiac silhouette compatible with a large hiatal hernia similar in appearance to previous study. Low lung volumes are noted with mild vascular congestion. Left basilar atelectasis is seen. Osteoarthritis of the [facility] and glenohumeral joints bilaterally. IMPRESSION: Stable cardiomegaly with aortic atherosclerosis. Superimposed large hiatal hernia is suggested. Left basilar atelectasis is noted. Electronically Signed   By: Ashley Royalty M.D.   On: 05/14/2018 22:43    Procedures Procedures (including critical care time)  Medications Ordered in ED Medications  aspirin chewable tablet 324 mg (324 mg Oral Given 05/14/18 2249)  morphine 4 MG/ML injection 4 mg (4 mg Intravenous Given 05/14/18 2251)  ondansetron (ZOFRAN) injection 4 mg (4 mg Intravenous Given 05/14/18 2251)     Initial Impression / Assessment and Plan / ED Course  I have reviewed the triage vital signs and the nursing  notes.  Pertinent labs & imaging results that were available during my care of the patient were reviewed by me and considered in my medical decision making (see chart for details).  Clinical Course as of May 15 25  Mon May 14, 2018  2324 FINDINGS: Stable cardiomegaly with mild aortic atherosclerosis. Air like lucency projects over the cardiac silhouette compatible with a large hiatal hernia similar in appearance to previous study. Low lung volumes are noted  with mild vascular congestion. Left basilar atelectasis is seen. Osteoarthritis of the [facility] and glenohumeral joints bilaterally.    DG Chest 2 View [CG]  2324 Creatinine(!): 1.69 [CG]  2324 GFR, Est Non African American(!): 32 [CG]  Tue May 15, 2018  0007 Re-evaluated pt now complaining of nausea and pain in back between shoulder blades. BP 120/80. 2+ radial and DP pulses bilaterally. Abdomen soft, still mildly tender at RUQ/epigastrium. Discussed work up so far reassuring, recommending CTA chest and abd, pt and family in agreement.    [CG]  0011 Spoke to CT tech regarding IV contrast and GFR, will give reduced dose. Pt will be taken to CT in 10 mins.    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  He points to RUQ. H/o esophageal spasm, GERD.    On exam he has RUQ tenderness, positive Murphy's with guarding. No reproducible chest wall tenderness. LE edema that is chronic and unchanged, no calf tenderness or asymmetry.  Distal pulses bilaterally. No focal numbness or weakness. Given exam favoring GI/cholecystitis over cardiac etiology.  Cardiac risk factors include age and HLD. He does not have pleuritic symptoms, tachycardia, tachypnea, hypoxia and PE lower on differential. Chart review reveals patient had a normal stress test in 2017 and echo with EF 55-65% in 2016. We'll obtain CP work up, LFT and RUQ [place]. Asprin, morphine  ordered.   Final Clinical Impressions(s) / ED Diagnoses   0027: Pt now endorsing back pain and nausea. Repeat evaluation and VS WNL. Repeat EKG unchanged. Pt will be handed off to oncoming EDPA pending CTA chest, abd, pelvis to r/o dissection.   Will redose analgesia and zofran.  Anticipate admission for CP rule out.  Discussed plan for shift change with pt and family. Pt shared with Dr Gilford Raid.  Final diagnoses:  Right upper quadrant abdominal pain    ED Discharge Orders    None       Arlean Hopping 05/15/18 Dreama Saa, MD 05/17/18 1208

## 2018-05-14 NOTE — ED Triage Notes (Signed)
Pt arrives via EMS from home with c/o chest pain for which pt took two nitros SL and called 911. Pt reports that he did not wait 5 minutes between doses. Pt hypotensive, diaphoretic and pale upon EMS arrival, initial BP in the 70s. EMS gave 500 ML of NS, BP up to 140 palpated. Takes plavix daily.

## 2018-05-15 ENCOUNTER — Other Ambulatory Visit: Payer: Self-pay

## 2018-05-15 ENCOUNTER — Observation Stay (HOSPITAL_BASED_OUTPATIENT_CLINIC_OR_DEPARTMENT_OTHER): Payer: Medicare Other

## 2018-05-15 ENCOUNTER — Other Ambulatory Visit: Payer: Self-pay | Admitting: *Deleted

## 2018-05-15 ENCOUNTER — Encounter (HOSPITAL_COMMUNITY): Payer: Self-pay | Admitting: Internal Medicine

## 2018-05-15 ENCOUNTER — Emergency Department (HOSPITAL_COMMUNITY): Payer: Medicare Other

## 2018-05-15 DIAGNOSIS — K5730 Diverticulosis of large intestine without perforation or abscess without bleeding: Secondary | ICD-10-CM | POA: Diagnosis not present

## 2018-05-15 DIAGNOSIS — R079 Chest pain, unspecified: Secondary | ICD-10-CM

## 2018-05-15 DIAGNOSIS — I361 Nonrheumatic tricuspid (valve) insufficiency: Secondary | ICD-10-CM

## 2018-05-15 DIAGNOSIS — E039 Hypothyroidism, unspecified: Secondary | ICD-10-CM | POA: Diagnosis not present

## 2018-05-15 DIAGNOSIS — I10 Essential (primary) hypertension: Secondary | ICD-10-CM

## 2018-05-15 DIAGNOSIS — I5032 Chronic diastolic (congestive) heart failure: Secondary | ICD-10-CM | POA: Diagnosis not present

## 2018-05-15 LAB — HEPATIC FUNCTION PANEL
ALT: 43 U/L (ref 0–44)
AST: 60 U/L — ABNORMAL HIGH (ref 15–41)
Albumin: 3.6 g/dL (ref 3.5–5.0)
Alkaline Phosphatase: 67 U/L (ref 38–126)
Bilirubin, Direct: 0.3 mg/dL — ABNORMAL HIGH (ref 0.0–0.2)
Indirect Bilirubin: 0.7 mg/dL (ref 0.3–0.9)
Total Bilirubin: 1 mg/dL (ref 0.3–1.2)
Total Protein: 6.1 g/dL — ABNORMAL LOW (ref 6.5–8.1)

## 2018-05-15 LAB — BASIC METABOLIC PANEL
Anion gap: 7 (ref 5–15)
BUN: 18 mg/dL (ref 8–23)
CO2: 26 mmol/L (ref 22–32)
Calcium: 9 mg/dL (ref 8.9–10.3)
Chloride: 105 mmol/L (ref 98–111)
Creatinine, Ser: 1.7 mg/dL — ABNORMAL HIGH (ref 0.61–1.24)
GFR calc Af Amer: 37 mL/min — ABNORMAL LOW (ref >60)
GFR calc non Af Amer: 32 mL/min — ABNORMAL LOW (ref >60)
Glucose, Bld: 160 mg/dL — ABNORMAL HIGH (ref 70–99)
Potassium: 4.4 mmol/L (ref 3.5–5.1)
Sodium: 138 mmol/L (ref 135–145)

## 2018-05-15 LAB — CBC
HCT: 44.1 % (ref 39.0–52.0)
Hemoglobin: 14.2 g/dL (ref 13.0–17.0)
MCH: 31.4 pg (ref 26.0–34.0)
MCHC: 32.2 g/dL (ref 30.0–36.0)
MCV: 97.6 fL (ref 78.0–100.0)
Platelets: 217 10*3/uL (ref 150–400)
RBC: 4.52 MIL/uL (ref 4.22–5.81)
RDW: 12.7 % (ref 11.5–15.5)
WBC: 8.9 10*3/uL (ref 4.0–10.5)

## 2018-05-15 LAB — TROPONIN I
Troponin I: <0.03 ng/mL (ref <0.03)
Troponin I: <0.03 ng/mL (ref <0.03)

## 2018-05-15 LAB — ECHOCARDIOGRAM COMPLETE
Height: 62 in
Weight: 2396.8 oz

## 2018-05-15 LAB — CBG MONITORING, ED: Glucose-Capillary: 133 mg/dL — ABNORMAL HIGH (ref 70–99)

## 2018-05-15 MED ORDER — NITROGLYCERIN 0.4 MG SL SUBL: 0.4000 mg | SUBLINGUAL_TABLET | SUBLINGUAL | Status: DC | PRN

## 2018-05-15 MED ORDER — CLOPIDOGREL BISULFATE 75 MG PO TABS
75.0000 mg | ORAL_TABLET | Freq: Every day | ORAL | Status: DC
  Administered 2018-05-15: 75 mg via ORAL
  Filled 2018-05-15: qty 1

## 2018-05-15 MED ORDER — ACETAMINOPHEN 325 MG PO TABS: 650.0000 mg | ORAL_TABLET | Freq: Four times a day (QID) | ORAL | Status: DC | PRN

## 2018-05-15 MED ORDER — HYDROMORPHONE HCL 1 MG/ML IJ SOLN
0.5000 mg | Freq: Once | INTRAMUSCULAR | Status: AC
Start: 2018-05-15 — End: 2018-05-15
  Administered 2018-05-15: 0.5 mg via INTRAVENOUS
  Filled 2018-05-15: qty 1

## 2018-05-15 MED ORDER — LATANOPROST 0.005 % OP SOLN
1.0000 [drp] | Freq: Every day | OPHTHALMIC | Status: DC
  Filled 2018-05-15: qty 2.5

## 2018-05-15 MED ORDER — IOPAMIDOL (ISOVUE-370) INJECTION 76%
INTRAVENOUS | Status: AC
  Filled 2018-05-15: qty 100

## 2018-05-15 MED ORDER — ONDANSETRON HCL 4 MG/2ML IJ SOLN
4.0000 mg | Freq: Once | INTRAMUSCULAR | Status: DC
  Filled 2018-05-15: qty 2

## 2018-05-15 MED ORDER — FUROSEMIDE 20 MG PO TABS: 20.0000 mg | ORAL_TABLET | Freq: Every day | ORAL | 0 refills | Status: DC | PRN

## 2018-05-15 MED ORDER — IOPAMIDOL (ISOVUE-370) INJECTION 76%
100.0000 mL | Freq: Once | INTRAVENOUS | Status: AC | PRN
  Administered 2018-05-15: 80 mL via INTRAVENOUS

## 2018-05-15 MED ORDER — METOPROLOL SUCCINATE ER 25 MG PO TB24: 25.0000 mg | ORAL_TABLET | Freq: Every day | ORAL | Status: DC

## 2018-05-15 MED ORDER — ONDANSETRON HCL 4 MG PO TABS: 4.0000 mg | ORAL_TABLET | Freq: Four times a day (QID) | ORAL | Status: DC | PRN

## 2018-05-15 MED ORDER — ONDANSETRON HCL 4 MG/2ML IJ SOLN: 4.0000 mg | Freq: Four times a day (QID) | INTRAMUSCULAR | Status: DC | PRN

## 2018-05-15 MED ORDER — PANTOPRAZOLE SODIUM 40 MG PO TBEC
40.0000 mg | DELAYED_RELEASE_TABLET | Freq: Every day | ORAL | Status: DC
  Administered 2018-05-15: 40 mg via ORAL
  Filled 2018-05-15: qty 1

## 2018-05-15 MED ORDER — LEVOTHYROXINE SODIUM 50 MCG PO TABS
50.0000 ug | ORAL_TABLET | Freq: Every day | ORAL | Status: DC
  Administered 2018-05-15: 50 ug via ORAL
  Filled 2018-05-15: qty 1

## 2018-05-15 MED ORDER — ENOXAPARIN SODIUM 30 MG/0.3ML ~~LOC~~ SOLN
30.0000 mg | SUBCUTANEOUS | Status: DC
  Filled 2018-05-15: qty 0.3

## 2018-05-15 MED ORDER — ACETAMINOPHEN 650 MG RE SUPP: 650.0000 mg | Freq: Four times a day (QID) | RECTAL | Status: DC | PRN

## 2018-05-15 NOTE — ED Notes (Signed)
Admitting at bedside 

## 2018-05-15 NOTE — Progress Notes (Signed)
Discharge to home, wife at bedside. D/c instructions and follow up appointments discussed with patient ,verbalized understanding.

## 2018-05-15 NOTE — Discharge Instructions (Signed)
hilty 06/18/18 1:30 pm

## 2018-05-15 NOTE — Discharge Summary (Signed)
Physician Discharge Summary  Robert Rowe PXT:[id number] DOB: 30-Jul-1918 DOA: 05/14/2018  PCP: [name], MD  Admit date: 05/14/2018 Discharge date: 05/15/2018  Admitted From: home Discharge disposition: home   Recommendations for Outpatient Follow-Up:   1. Outpatient cardiology follow up   Discharge Diagnosis:   Principal Problem:   Chest pain Active Problems:   Hypothyroidism   Chronic diastolic CHF (congestive heart failure) ([place])   Essential hypertension    Discharge Condition: Improved.  Diet recommendation: Low sodium, heart healthy.  Carbohydrate-modified.  Regular.  Wound care: None.  Code status: Full.   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  Patient was going to the bed when patient started developing right-sided chest pain sudden in onset dull aching constant severe.  Denies any productive cough fever chills.  Denies any associated nausea vomiting.     Hospital Course by Problem:   Chest pain Per cardiology:  atypical chest pain. Symptoms do not sound anginal and he has ruled out for MI. At this point would manage conservatively and continue prior medication. Keep follow up wit Dr. [name] in July.  Hypertension uncontrolled on metoprolol for now.  Closely follow blood pressure trends.  Hypothyroidism on Synthroid.  History of diastolic CHF on PRN Lasix.  Chronic kidney disease stage III -creatinine appears to be at baseline.  History of stroke on Plavix.     Medical Consultants:  cards    Discharge Exam:   Vitals:   05/15/18 0852 05/15/18 1142  BP: 114/73 (!) 105/42  Pulse: 100 97  Resp: 18 18  Temp: 98.4 F (36.9 C) 97.7 F (36.5 C)  SpO2:  92%   Vitals:   05/15/18 0230 05/15/18 0337 05/15/18 0852 05/15/18 1142  BP: (!) 155/92 (!)  167/105 114/73 (!) 105/42  Pulse: 97 (!) 103 100 97  Resp: 16 20 18 18   Temp:  98.2 F (36.8 C) 98.4 F (36.9 C) 97.7 F (36.5 C)  TempSrc:  Oral Oral Oral  SpO2: 98% 99%  92%  Weight:  67.9 kg (149 lb 12.8 oz)    Height:  5\' 2"  (1.575 m)      General exam: Appears calm and comfortable.-- anxious to be d/c'd as he plans to eat [place] tonight  The results of significant diagnostics from this hospitalization (including imaging, microbiology, ancillary and laboratory) are listed below for reference.     Procedures and Diagnostic Studies:   Dg Chest 2 View  Result Date: 05/14/2018 CLINICAL DATA:  Chest pain and abdominal pain x1 day. EXAM: CHEST - 2 VIEW COMPARISON:  11/15/2015 CXR FINDINGS: Stable cardiomegaly with mild aortic atherosclerosis. Air like lucency projects over the cardiac silhouette compatible with a large hiatal hernia similar in appearance to previous study. Low lung volumes are noted with mild vascular congestion. Left basilar atelectasis is seen. 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 IMPRESSION: Stable cardiomegaly with aortic atherosclerosis. Superimposed large hiatal hernia is suggested. Left basilar atelectasis is noted. Electronically Signed   By: Ashley Royalty M.D.   On: 05/14/2018 22:43   Ct Angio Chest/abd/pel For Dissection W And/or Wo Contrast  Result Date: 05/15/2018 CLINICAL DATA:  Chest pain, diaphoresis and hypotension. EXAM: CT ANGIOGRAPHY CHEST, ABDOMEN AND PELVIS TECHNIQUE: Multidetector CT imaging through the chest, abdomen and pelvis was performed using  the standard protocol during bolus administration of intravenous contrast. Multiplanar reconstructed images and MIPs were obtained and reviewed to evaluate the vascular anatomy. CONTRAST:  42mL ISOVUE-370 IOPAMIDOL (ISOVUE-370) INJECTION 76% COMPARISON:  None. FINDINGS: CTA CHEST FINDINGS Cardiovascular: Atherosclerotic origins of the great vessels. Common arterial branch of the right  brachiocephalic and left common carotid arteries. Nonaneurysmal atherosclerosis of the thoracic aorta without dissection. Small focus of mixed soft and hard plaque is noted along the mid thoracic aortic arch along the lateral wall. Main pulmonary arteries 3.1 cm in caliber. No acute pulmonary embolus is identified to the segmental level. Heart is normal in size without pericardial effusion or thickening. Left main and three-vessel coronary arteriosclerosis. Mediastinum/Nodes: No enlarged mediastinal, hilar, or axillary lymph nodes. Thyroid gland, trachea, and esophagus demonstrate no significant findings. Lungs/Pleura: Eventration of the left hemidiaphragm with adjacent atelectasis. Dependent atelectasis noted bilaterally. 3 mm nodular density in the right lower lobe may represent a branch point for pulmonary vessel versus tiny pulmonary nodule, series 6/31. Musculoskeletal: Is seen at T2-3 degenerative disc disease. No acute nor suspicious osseous abnormalities. Review of the MIP images confirms the above findings. CTA ABDOMEN AND PELVIS FINDINGS VASCULAR Aorta: Ectatic distal descending thoracic aorta. Nonaneurysmal abdominal aorta with atherosclerosis. Celiac: Patent without evidence of aneurysm, dissection, vasculitis or significant stenosis. Minimal plaque at the origin of the celiac axis without significant luminal narrowing. SMA: Patent without evidence of aneurysm, dissection, or vasculitis. Mix of soft and hard plaque within the more distal SMA with 50% luminal narrowing, series 10/148. Renals: Patent single renal arteries bilaterally with atherosclerotic origins on the left. No significant stenosis, fibromuscular dysplasia or aneurysm. IMA: Patent Inflow: Mild atherosclerosis of the common iliac arteries and branch vessels. 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 Veins: No obvious venous abnormality within the  limitations of this arterial phase study. Review of the MIP images confirms the above findings. NON-VASCULAR Hepatobiliary: No focal liver abnormality is seen. No gallstones, gallbladder wall thickening, or biliary dilatation. Pancreas: Atrophic pancreas without mass, ductal dilatation or inflammation. Spleen: Normal in size without focal abnormality. Adrenals/Urinary Tract: Normal bilateral adrenal glands. Mild irregular cortical scarring of both kidneys. Bilateral water attenuating cysts identified the largest on the left measuring 4.5 cm in diameter. Smaller parapelvic right-sided renal cysts are present measuring up to 2.3 cm. No hydroureteronephrosis. Contrast noted within the dependent aspect of the urinary bladder. Stomach/Bowel: Physiologic distention of the stomach. Normal small bowel rotation without obstruction or inflammation. Scattered colonic diverticulosis without acute diverticulitis. Lymphatic: No lymphadenopathy. Reproductive: Enlarged prostate measuring up to 5 cm with TURP defect. Other: No free air nor free fluid.  Bilateral herniorrhaphies. Musculoskeletal: Lumbar spondylosis with degenerative disc disease of the lumbar spine from L1 through S1. Review of the MIP images confirms the above findings. IMPRESSION: Vascular: 1. Nonaneurysmal atherosclerotic thoracic aorta without dissection. 2. No acute pulmonary embolus. 3. Left main and three-vessel coronary arteriosclerosis. 4. Aortoiliac branch vessel atherosclerosis. 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 5. Moderate plaque also noted in the distal SMA with narrowing the lumen up to 50%. Nonvascular: 1. 2. 3 mm right lower lobe nodular density. No follow-up needed if patient is low-risk. Non-contrast chest CT can be considered in 12 months if patient is high-risk. This recommendation follows the consensus statement: Guidelines for  Management of Incidental Pulmonary Nodules Detected  on CT Images: From the Fleischner Society 2017; Radiology 2017; [phone]. Bilateral renal cysts the largest on the left measuring up to 4.5 cm. 3. Enlarged prostate TURP defect. 4. Thoracolumbar spondylosis. 5. Colonic diverticulosis without acute diverticulitis. Electronically Signed   By: Ashley Royalty M.D.   On: 05/15/2018 01:54   US Abdomen Limited Ruq  Result Date: 05/14/2018 CLINICAL DATA:  84 year old male with right upper quadrant abdominal pain. EXAM: ULTRASOUND ABDOMEN LIMITED RIGHT UPPER QUADRANT COMPARISON:  None. FINDINGS: Gallbladder: No gallstones or wall thickening visualized. No sonographic Murphy sign noted by sonographer. Common bile duct: Diameter: 3 mm Liver: Unremarkable as visualized. Portal vein is patent on color Doppler imaging with normal direction of blood flow towards the liver. IMPRESSION: Unremarkable right upper quadrant ultrasound. Electronically Signed   By: Anner Crete M.D.   On: 05/14/2018 23:53     Labs:   Basic Metabolic Panel: Recent Labs  Lab 05/14/18 2149 05/15/18 0436  NA 138 138  K 4.1 4.4  CL 105 105  CO2 25 26  GLUCOSE 164* 160*  BUN 20 18  CREATININE 1.69* 1.70*  CALCIUM 9.1 9.0   GFR Estimated Creatinine Clearance: 21 mL/min (A) (by C-G formula based on SCr of 1.7 mg/dL (H)). Liver Function Tests: Recent Labs  Lab 05/14/18 2149 05/15/18 0436  AST 16 60*  ALT 13* 43  ALKPHOS 72 67  BILITOT 0.6 1.0  PROT 6.2* 6.1*  ALBUMIN 3.6 3.6   Recent Labs  Lab 05/14/18 2149  LIPASE 44   No results for input(s): AMMONIA in the last 168 hours. Coagulation profile No results for input(s): INR, PROTIME in the last 168 hours.  CBC: Recent Labs  Lab 05/14/18 2149 05/15/18 0436  WBC 6.9 8.9  HGB 14.4 14.2  HCT 45.3 44.1  MCV 98.7 97.6  PLT 205 217   Cardiac Enzymes: Recent Labs  Lab 05/15/18 0436 05/15/18 0922  TROPONINI <0.03 <0.03   BNP: Invalid input(s):  POCBNP CBG: Recent Labs  Lab 05/15/18 0254  GLUCAP 133*   D-Dimer No results for input(s): DDIMER in the last 72 hours. Hgb A1c No results for input(s): HGBA1C in the last 72 hours. Lipid Profile No results for input(s): CHOL, HDL, LDLCALC, TRIG, CHOLHDL, LDLDIRECT in the last 72 hours. Thyroid function studies No results for input(s): TSH, T4TOTAL, T3FREE, THYROIDAB in the last 72 hours.  Invalid input(s): FREET3 Anemia work up No results for input(s): VITAMINB12, FOLATE, FERRITIN, TIBC, IRON, RETICCTPCT in the last 72 hours. Microbiology No results found for this or any previous visit (from the past 240 hour(s)).   Discharge Instructions:   Discharge Instructions    Diet - low sodium heart healthy   Complete by:  As directed    Increase activity slowly   Complete by:  As directed      Allergies as of 05/15/2018      Reactions   Sulfonamide Derivatives Other (See Comments)   Patient does not remember allergy symptoms      Medication List    STOP taking these medications   neomycin-polymyxin b-dexamethasone 3.5-10000-0.1 Susp Commonly known as:  MAXITROL     TAKE these medications   acetaminophen 325 MG tablet Commonly known as:  TYLENOL Take 650 mg by mouth every morning.   clopidogrel 75 MG tablet Commonly known as:  PLAVIX Take 1 tablet (75 mg total) by mouth daily.   furosemide 20 MG tablet Commonly known as:  LASIX Take 1 tablet (20 mg total) by mouth daily  as needed for fluid or edema.   latanoprost 0.005 % ophthalmic solution Commonly known as:  XALATAN Instill 1 drop into both eyes at bedtime 50/2=25   levothyroxine 50 MCG tablet Commonly known as:  SYNTHROID, LEVOTHROID TAKE 1 TABLET (50 MCG TOTAL) BY MOUTH DAILY.   metoprolol succinate 25 MG 24 hr tablet Commonly known as:  TOPROL-XL Take 1 tablet (25 mg total) by mouth at bedtime.   multivitamin with minerals Tabs tablet Take 1 tablet by mouth 3 (three) times a week.   nitroGLYCERIN  0.4 MG SL tablet Commonly known as:  NITROSTAT Place 0.4 mg under the tongue every 5 (five) minutes as needed (ESOPHEAGEAL SPASPMS). Reported on 03/01/2016   pantoprazole 40 MG tablet Commonly known as:  PROTONIX Take 1 tablet (40 mg total) by mouth daily.      Follow-up Information    [name], MD. Go on 06/01/2018.   Specialty:  Family Medicine Why:  @8 :Max Sane information: [place] [id number] [phone]        Pixie Casino, MD Follow up on 06/18/2018.   Specialty:  Cardiology Why:  Please arrive 15 minutes early for your 1:30pm appointment Contact information: [place]  [id number] [phone]            Time coordinating discharge: 25 min  Signed:  Geradine Girt  [facility] 05/15/2018, 2:28 PM

## 2018-05-15 NOTE — Consult Note (Signed)
Cardiology Consultation:   Patient ID: Robert Rowe; [id number]; [birth year]   Admit date: 05/14/2018 Date of Consult: 05/15/2018  Primary Care Provider: [name], MD Primary Cardiologist: Robert Casino, MD Primary Electrophysiologist:  None   Patient Profile:   Robert Rowe is a 84 y.o. male with a PMH of chronic diastolic CHF, HTN, HLD, frequent PVCs, RA, CKD stage 3, prior TIA, hiatal hernia, and esophageal spasms, who is being seen today for the evaluation of chest pain at the request of Dr. Eliseo Rowe.  History of Present Illness:   Robert Rowe was in his usual state of health until the evening of 05/14/18 when he developed sudden onset right sided lower chest/ RUQ abdominal pain while getting ready for bed. He states he has never experienced pain like this before. He described it as a severe aching pain which radiated to his upper back at one point. He denied associated SOB, diaphoresis, N/V, dizziness, lightheadedness, or syncope. He states he took 2 SL nitro (which he keeps on hand for esophageal spasms) with minimal relief of symptoms. Given persistent pain, wife called EMS. At the time of EMS arrival, he was reported to be hypotensive with SBP in the 70s, diaphoretic, and pale. He was given a NS bolus en route to the ED and BP improved to 140s.   He was last seen outpatient by Robert Rowe 11/2017 at which time he had complaints of SOB which he stated was chronic and tolerable given his limited mobility at home. He was noted to have severe diffusion defects on PFTs but was refusing home O2. He was recommended to continue prn lasix given stable breathing and no weight changes. He was recommended to RTC in 6 months. His last ischemic evaluation was a NST 11/2015 without evidence of ischemia. Last Echo 10/2015 with EF 55-60% and G1DD.   At the time of this evaluation, he reports feeling 100% back to baseline. His pain eventually resolved overnight after he was given IV dilaudid. He  denies recent episodes of chest pain prior to yesterday. States his breathing is at baseline. He takes prn lasix for LE edema and typically takes this 2-3 times per week. He denies orthopnea or PND.   Hospital course: BP initially elevated, now on the soft side; otherwise VSS. Labs notable for electrolytes wnl, Cr 1.7, Hgb 14.2, PLT 217, Trop negative x3. EKG with sinus rhythm with submm STD in anterolateral leads, QTc 453 this morning. RUQ [place] without acute findings. 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 Patient was admitted to medicine for observation. Cardiology asked to evaluate for chest pain.  Past Medical History:  Diagnosis Date  . BENIGN PROSTATIC HYPERTROPHY 04/05/2007  . 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 CHEST DISCOMFORT 04/25/2009  . [place] diastolic CH[place] ([place]    a. Echo 12/16: mild LVH, EF 55-60%, no RWMA, Gr 1 DD, mild AI, MAC, mild LAE, normal RVF, PASP 37 mmHg  . Chronic kidney disease    chronic   . DJD (degenerative joint disease)   . Dysrhythmia    pvc's   . GERD 12/03/2007  . Hiatal hernia   . History of nuclear stress test    Myoview 1/17: EF 53%, normal perfusion; Low Risk  .  HYPERLIPIDEMIA 04/05/2007  . Hypothyroidism   . Osteoarth NOS-Unspec 04/05/2007  . Palpitations 02/12/2010  . PEDAL EDEMA 05/01/2008  . Rheumatoid arthritis(714.0) 04/05/2007  . Shortness of breath dyspnea   . TRANSIENT ISCHEMIC ATTACK, HX OF 04/05/2007   ?     Past Surgical History:  Procedure Laterality Date  . BALLOON DILATION N/A 03/12/2013   Procedure: BALLOON DILATION;  Surgeon: Robert Castle, MD;  Location: [facility];  Service: Endoscopy;  Laterality: N/A;  . CATARACT EXTRACTION  2001   bilateral  .  CYSTOSCOPY WITH URETHRAL DILATATION N/A 09/24/2015   Procedure: CYSTOSCOPY WITH COOK BALLOON DILATATION OF URETHRAL STRICTURE ;  Surgeon: Robert Clines, MD;  Location: [facility];  Service: Urology;  Laterality: N/A;  . CYSTOSCOPY WITH URETHRAL DILATATION N/A 07/20/2016   Procedure: CYSTOSCOPY WITH URETHRAL DILATATION;  Surgeon: Robert Frock, MD;  Location: [facility];  Service: Urology;  Laterality: N/A;  1 HOUR  . ESOPHAGOGASTRODUODENOSCOPY N/A 03/12/2013   Procedure: ESOPHAGOGASTRODUODENOSCOPY (EGD);  Surgeon: Robert Castle, MD;  Location: [facility];  Service: Endoscopy;  Laterality: N/A;  . HEMORRHOID SURGERY    . HERNIA REPAIR     inguinal x5  . KNEE SURGERY  arthroscopic   left  . PROSTATE CRYOABLATION     2000  . SHOULDER SURGERY  few years ago   left  . TRANSURETHRAL RESECTION OF PROSTATE N/A 03/19/2015   Procedure: TRANSURETHRAL RESECTION OF THE PROSTATE (TURP);  Surgeon: Robert Clines, MD;  Location: [facility];  Service: Urology;  Laterality: N/A;     Home Medications:  Prior to Admission medications   Medication Sig Start Date End Date Taking? Authorizing Provider  acetaminophen (TYLENOL) 325 MG tablet Take 650 mg by mouth every morning.   Yes [provider]  clopidogrel (PLAVIX) 75 MG tablet Take 1 tablet (75 mg total) by mouth daily. 05/11/18  Yes Burchette, Alinda Sierras, MD  furosemide (LASIX) 20 MG tablet Take 1 tablet (20 mg total) by mouth daily as needed for fluid or edema. 01/02/18  Yes Robert Lor, MD  latanoprost (XALATAN) 0.005 % ophthalmic solution Instill 1 drop into both eyes at bedtime 50/2=25 02/08/18  Yes [provider]  levothyroxine (SYNTHROID, LEVOTHROID) 50 MCG tablet TAKE 1 TABLET (50 MCG TOTAL) BY MOUTH DAILY. 08/30/17  Yes Robert Lor, MD  metoprolol succinate (TOPROL-XL) 25 MG 24 hr tablet Take 1 tablet (25 mg total) by mouth at bedtime. 05/11/18  Yes Burchette, Alinda Sierras, MD  Multiple Vitamin (MULTIVITAMIN WITH MINERALS)  TABS tablet Take 1 tablet by mouth 3 (three) times a week.   Yes [provider]  nitroGLYCERIN (NITROSTAT) 0.4 MG SL tablet Place 0.4 mg under the tongue every 5 (five) minutes as needed (ESOPHEAGEAL SPASPMS). Reported on 03/01/2016   Yes [provider]  pantoprazole (PROTONIX) 40 MG tablet Take 1 tablet (40 mg total) by mouth daily. 05/11/18  Yes Burchette, Alinda Sierras, MD  neomycin-polymyxin b-dexamethasone (MAXITROL) 3.5-10000-0.1 SUSP Place 2 drops into both eyes 4 (four) times daily. Patient not taking: Reported on 05/15/2018 10/20/17   Robert Lor, MD    Inpatient Medications: Scheduled Meds: . clopidogrel  75 mg Oral Daily  . enoxaparin (LOVENOX) injection  30 mg Subcutaneous Q24H  . latanoprost  1 drop Both Eyes QHS  . levothyroxine  50 mcg Oral QAC breakfast  . metoprolol succinate  25 mg Oral QHS  . pantoprazole  40 mg Oral Daily   Continuous Infusions:  PRN Meds: acetaminophen **OR** acetaminophen, nitroGLYCERIN,  ondansetron **OR** ondansetron (ZOFRAN) IV  Allergies:    Allergies  Allergen Reactions  . Sulfonamide Derivatives Other (See Comments)    Patient does not remember allergy symptoms    Social History:   Social History   Socioeconomic History  . Marital status: Married    Spouse name: Not on file  . Number of children: Not on file  . Years of education: Not on file  . Highest education level: Not on file  Occupational History  . Not on file  Social Needs  . Financial resource strain: Not on file  . Food insecurity:    Worry: Not on file    Inability: Not on file  . Transportation needs:    Medical: Not on file    Non-medical: Not on file  Tobacco Use  . Smoking status: Former Smoker    Types: Cigarettes    Last attempt to quit: 08/13/1953    Years since quitting: 64.7  . Smokeless tobacco: Never Used  Substance and Sexual Activity  . Alcohol use: Yes    Comment: occ vodka  . Drug use: No  . Sexual activity: Not on file    Lifestyle  . Physical activity:    Days per week: Not on file    Minutes per session: Not on file  . Stress: Not on file  Relationships  . Social connections:    Talks on phone: Not on file    Gets together: Not on file    Attends religious service: Not on file    Active member of club or organization: Not on file    Attends meetings of clubs or organizations: Not on file    Relationship status: Not on file  . Intimate partner violence:    Fear of current or ex partner: Not on file    Emotionally abused: Not on file    Physically abused: Not on file    Forced sexual activity: Not on file  Other Topics Concern  . Not on file  Social History Narrative  . Not on file    Family History:    Family History  Problem Relation Age of Onset  . Breast cancer Mother   . Heart disease Father   . Heart attack Father   . Lymphoma Brother   . Stroke Neg Hx   . Hypertension Neg Hx      ROS:  Please see the history of present illness.   All other ROS reviewed and negative.     Physical Exam/Data:   Vitals:   05/15/18 0230 05/15/18 0337 05/15/18 0852 05/15/18 1142  BP: (!) 155/92 (!) 167/105 114/73 (!) 105/42  Pulse: 97 (!) 103 100 97  Resp: _0 Temp:  98.2 F (36.8 C) 98.4 F (36.9 C) 97.7 F (36.5 C)  TempSrc:  Oral Oral Oral  SpO2: 98% 99%  92%  Weight:  149 lb 12.8 oz (67.9 kg)    Height:  _1  (1.575 m)      Intake/Output Summary (Last 24 hours) at 05/15/2018 1244 Last data filed at 05/15/2018 0555 Gross per 24 hour  Intake -  Output 250 ml  Net -250 ml   Filed Weights   05/14/18 2144 05/15/18 0337  Weight: 145 lb (65.8 kg) 149 lb 12.8 oz (67.9 kg)   Body mass index is 27.4 kg/m.  General:  Well nourished, well developed, appears younger than stated age. Sitting in bedside chair in no acute distress HEENT: sclera anicteric  Neck: no JVD Vascular: No carotid bruits; distal pulses 2+ bilaterally Cardiac:  normal S1, S2; RRR; no murmurs, gallops, or  rubs Lungs:  clear to auscultation bilaterally, no wheezing, rhonchi or rales  Abd: NABS, soft, obese, nontender, no hepatomegaly Ext: 1-2+ ankle edema bilaterally Musculoskeletal:  No deformities, BUE and BLE strength normal and equal Skin: warm and dry  Neuro:  CNs 2-12 intact, no focal abnormalities noted Psych:  Normal affect   EKG:  The EKG was personally reviewed and demonstrates:  sinus rhythm with submm STD in anterolateral leads, QTc 453 this morning. Telemetry:  Telemetry was personally reviewed and demonstrates:  Sinus rhythm  Relevant CV Studies: NST 11/2015:  Nuclear stress EF: 53%.  There was no ST segment deviation noted during stress.  The study is normal.  This is a low risk study.  The left ventricular ejection fraction is normal (55-65%).   Echo 10/2015: Study Conclusions  - Left ventricle: The cavity size was normal. There was mild   concentric hypertrophy. Systolic function was normal. The   estimated ejection fraction was in the range of 55% to 60%. Wall   motion was normal; there were no regional wall motion   abnormalities. Doppler parameters are consistent with abnormal   left ventricular relaxation (grade 1 diastolic dysfunction).   Doppler parameters are consistent with high ventricular filling   pressure. - Aortic valve: Normal thickness, mildly calcified leaflets. There   was mild regurgitation. - Mitral valve: Calcified annulus. - Left atrium: The atrium was mildly dilated. - Right ventricle: The cavity size was normal. Wall thickness was   normal. Systolic function was normal. - Pulmonary arteries: Systolic pressure was mildly increased. PA   peak pressure: 37 mm Hg (S). - Inferior vena cava: The vessel was normal in size. The   respirophasic diameter changes were in the normal range (>= 50%),   consistent with normal central venous pressure.  Repeat echo pending  Laboratory Data:  Chemistry Recent Labs  Lab 05/14/18 2149  05/15/18 0436  NA 138 138  K 4.1 4.4  CL 105 105  CO2 25 26  GLUCOSE 164* 160*  BUN 20 18  CREATININE 1.69* 1.70*  CALCIUM 9.1 9.0  GFRNONAA 32* 32*  GFRAA 37* 37*  ANIONGAP 8 7    Recent Labs  Lab 05/14/18 2149 05/15/18 0436  PROT 6.2* 6.1*  ALBUMIN 3.6 3.6  AST 16 60*  ALT 13* 43  ALKPHOS 72 67  BILITOT 0.6 1.0   Hematology Recent Labs  Lab 05/14/18 2149 05/15/18 0436  WBC 6.9 8.9  RBC 4.59 4.52  HGB 14.4 14.2  HCT 45.3 44.1  MCV 98.7 97.6  MCH 31.4 31.4  MCHC 31.8 32.2  RDW 12.5 12.7  PLT 205 217   Cardiac Enzymes Recent Labs  Lab 05/15/18 0436 05/15/18 0922  TROPONINI <0.03 <0.03    Recent Labs  Lab 05/14/18 2201  TROPIPOC 0.00    BNPNo results for input(s): BNP, PROBNP in the last 168 hours.  DDimer No results for input(s): DDIMER in the last 168 hours.  Radiology/Studies:  Dg Chest 2 View  Result Date: 05/14/2018 CLINICAL DATA:  Chest pain and abdominal pain x1 day. EXAM: CHEST - 2 VIEW COMPARISON:  11/15/2015 CXR FINDINGS: Stable cardiomegaly with mild aortic atherosclerosis. Air like lucency projects over the cardiac silhouette compatible with a large hiatal hernia similar in appearance to previous study. Low lung volumes are noted with mild vascular congestion. Left basilar atelectasis is seen. Osteoarthritis of  the [facility] and glenohumeral joints bilaterally. IMPRESSION: Stable cardiomegaly with aortic atherosclerosis. Superimposed large hiatal hernia is suggested. Left basilar atelectasis is noted. Electronically Signed   By: Ashley Royalty M.D.   On: 05/14/2018 22:43   Ct Angio Chest/abd/pel For Dissection W And/or Wo Contrast  Result Date: 05/15/2018 CLINICAL DATA:  Chest pain, diaphoresis and hypotension. EXAM: CT ANGIOGRAPHY CHEST, ABDOMEN AND PELVIS TECHNIQUE: Multidetector CT imaging through the chest, abdomen and pelvis was performed using the standard protocol during bolus administration of intravenous contrast. Multiplanar reconstructed images  and MIPs were obtained and reviewed to evaluate the vascular anatomy. CONTRAST:  87m ISOVUE-370 IOPAMIDOL (ISOVUE-370) INJECTION 76% COMPARISON:  None. FINDINGS: CTA CHEST FINDINGS Cardiovascular: Atherosclerotic origins of the great vessels. Common arterial branch of the right brachiocephalic and left common carotid arteries. Nonaneurysmal atherosclerosis of the thoracic aorta without dissection. Small focus of mixed soft and hard plaque is noted along the mid thoracic aortic arch along the lateral wall. Main pulmonary arteries 3.1 cm in caliber. No acute pulmonary embolus is identified to the segmental level. Heart is normal in size without pericardial effusion or thickening. Left main and three-vessel coronary arteriosclerosis. Mediastinum/Nodes: No enlarged mediastinal, hilar, or axillary lymph nodes. Thyroid gland, trachea, and esophagus demonstrate no significant findings. Lungs/Pleura: Eventration of the left hemidiaphragm with adjacent atelectasis. Dependent atelectasis noted bilaterally. 3 mm nodular density in the right lower lobe may represent a branch point for pulmonary vessel versus tiny pulmonary nodule, series 6/31. Musculoskeletal: Is seen at T2-3 degenerative disc disease. No acute nor suspicious osseous abnormalities. Review of the MIP images confirms the above findings. CTA ABDOMEN AND PELVIS FINDINGS VASCULAR Aorta: Ectatic distal descending thoracic aorta. Nonaneurysmal abdominal aorta with atherosclerosis. Celiac: Patent without evidence of aneurysm, dissection, vasculitis or significant stenosis. Minimal plaque at the origin of the celiac axis without significant luminal narrowing. SMA: Patent without evidence of aneurysm, dissection, or vasculitis. Mix of soft and hard plaque within the more distal SMA with 50% luminal narrowing, series 10/148. Renals: Patent single renal arteries bilaterally with atherosclerotic origins on the left. No significant stenosis, fibromuscular dysplasia or  aneurysm. IMA: Patent Inflow: Mild atherosclerosis of the common iliac arteries and branch vessels. 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 Veins: No obvious venous abnormality within the limitations of this arterial phase study. Review of the MIP images confirms the above findings. NON-VASCULAR Hepatobiliary: No focal liver abnormality is seen. No gallstones, gallbladder wall thickening, or biliary dilatation. Pancreas: Atrophic pancreas without mass, ductal dilatation or inflammation. Spleen: Normal in size without focal abnormality. Adrenals/Urinary Tract: Normal bilateral adrenal glands. Mild irregular cortical scarring of both kidneys. Bilateral water attenuating cysts identified the largest on the left measuring 4.5 cm in diameter. Smaller parapelvic right-sided renal cysts are present measuring up to 2.3 cm. No hydroureteronephrosis. Contrast noted within the dependent aspect of the urinary bladder. Stomach/Bowel: Physiologic distention of the stomach. Normal small bowel rotation without obstruction or inflammation. Scattered colonic diverticulosis without acute diverticulitis. Lymphatic: No lymphadenopathy. Reproductive: Enlarged prostate measuring up to 5 cm with TURP defect. Other: No free air nor free fluid.  Bilateral herniorrhaphies. Musculoskeletal: Lumbar spondylosis with degenerative disc disease of the lumbar spine from L1 through S1. Review of the MIP images confirms the above findings. IMPRESSION: Vascular: 1. Nonaneurysmal atherosclerotic thoracic aorta without dissection. 2. No acute pulmonary embolus. 3. Left main and three-vessel coronary arteriosclerosis. 4. Aortoiliac branch vessel atherosclerosis. Aneurysmal dilatation of the  left internal iliac artery measuring up to 1.8 cm with mural thrombus contributing to moderate to 50% luminal narrowing. 5. Moderate plaque also noted in the distal SMA  with narrowing the lumen up to 50%. Nonvascular: 1. 2. 3 mm right lower lobe nodular density. No follow-up needed if patient is low-risk. Non-contrast chest CT can be considered in 12 months if patient is high-risk. This recommendation follows the consensus statement: Guidelines for Management of Incidental Pulmonary Nodules Detected on CT Images: From the Fleischner Society 2017; Radiology 2017; 284:228-243. Bilateral renal cysts the largest on the left measuring up to 4.5 cm. 3. Enlarged prostate TURP defect. 4. Thoracolumbar spondylosis. 5. Colonic diverticulosis without acute diverticulitis. Electronically Signed   By: Ashley Royalty M.D.   On: 05/15/2018 01:54   US Abdomen Limited Ruq  Result Date: 05/14/2018 CLINICAL DATA:  84 year old male with right upper quadrant abdominal pain. EXAM: ULTRASOUND ABDOMEN LIMITED RIGHT UPPER QUADRANT COMPARISON:  None. FINDINGS: Gallbladder: No gallstones or wall thickening visualized. No sonographic Murphy sign noted by sonographer. Common bile duct: Diameter: 3 mm Liver: Unremarkable as visualized. Portal vein is patent on color Doppler imaging with normal direction of blood flow towards the liver. IMPRESSION: Unremarkable right upper quadrant ultrasound. Electronically Signed   By: Anner Crete M.D.   On: 05/14/2018 23:53    Assessment and Plan:   1. Chest pain: patient reported sudden onset of right-sided lower chest/ RUQ abdominal pain last night. Trop negative x3. EKG with non-specific ST-T wave abnormalities. CTA c/f multivessel disease. He has no known heart disease history. Last ischemic evaluation was a NST 11/2015 which was negative for ischemia. Echo today with EF 65-70%, G2DD, and no wall motion abnormalities. Patient is not interested in invasive testing and would not want a cardiac catheterization in the future. - No need for further ischemic testing. - Continue home aspirin, plavix, and metoprolol. - Continue prn SL nitro   2. Chronic diastolic  CHF: patient reports chronic dyspnea and LE edema for which he takes prn lasix. Echo today with EF 65-70%, G2DD, and no wall motion abnormalities. With some pedal/ankle edema on exam.  - Continue prn lasix and metoprolol  3. HTN: BP somewhat labile this admission.  - Continue current regimen - can make adjustments outpatient if HTN persists  Has an appointment with Robert Rowe 06/18/18 at 1:30pm.    For questions or updates, please contact [facility] Please consult [email or web address] for contact info under Cardiology/STEMI.   Signed, Abigail Butts, PA-C  05/15/2018 12:44 PM [phone]

## 2018-05-15 NOTE — Progress Notes (Signed)
  Echocardiogram 2D Echocardiogram has been performed.  Robert Rowe L Androw 05/15/2018, 10:46 AM

## 2018-05-15 NOTE — ED Provider Notes (Signed)
Patient signed out to me at shift change.  Patient with right upper quadrant abdominal pain.  Sudden in onset today.  Negative right upper quadrant ultrasound.  While in the ED he had some chest pain that radiates to his back with some associated nausea.  CT angiogram of the aorta was ordered to rule out dissection.  CT is negative for dissection, but does show left main and three-vessel coronary arterial sclerosis.  Will admit for chest pain rule out.  Patient discussed with Dr. Hal Hope, who will admit the patient.   Montine Circle, PA-C 05/15/18 9675    Isla Pence, MD 05/17/18 1209

## 2018-05-15 NOTE — H&P (Signed)
History and Physical    Robert Rowe ZLD:[id number] DOB: July 23, 1918 DOA: 05/14/2018  PCP: [name], MD  Patient coming from: Home.  Chief Complaint: Chest pain.  HPI: Robert Rowe is a 84 y.o. male with history of hypertension, stroke, hypothyroidism, chronic kidney disease presents to the ER with complaint of sudden onset of right-sided chest pain.  Patient was going to the bed when patient started developing right-sided chest pain sudden in onset dull aching constant severe.  Denies any productive cough fever chills.  Denies any associated nausea vomiting.  ED Course: In the ER troponins were negative EKG was showing ST depression in the lateral leads.  Sonogram of the right upper quadrant was negative for anything acute.  CT angiogram of the chest and abdomen was done to rule out dissection which was negative for pulmonary embolism also.  Patient pain improved with pain relief medications.  Since patient EKG shows ST depression in the lateral leads and CT angiogram of the chest shows left main disease patient admitted for further observation.  Review of Systems: As per HPI, rest all negative.   Past Medical History:  Diagnosis Date  . BENIGN PROSTATIC HYPERTROPHY 04/05/2007  . 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 CHEST DISCOMFORT 04/25/2009  . Chronic diastolic CHF (congestive heart failure) ([place])    a. Echo 12/16: mild LVH, EF 55-60%, no RWMA, Gr 1 DD, mild AI, MAC, mild LAE, normal RVF, PASP 37 mmHg  . Chronic kidney disease    chronic   . DJD (degenerative joint disease)   . Dysrhythmia    pvc's   . GERD 12/03/2007  . Hiatal hernia   . History of nuclear stress test    Myoview 1/17: EF 53%, normal perfusion; Low Risk  . HYPERLIPIDEMIA 04/05/2007  . Hypothyroidism   . Osteoarth NOS-Unspec 04/05/2007  . Palpitations 02/12/2010  . PEDAL EDEMA 05/01/2008  . Rheumatoid arthritis(714.0) 04/05/2007  . Shortness of breath dyspnea   . TRANSIENT  ISCHEMIC ATTACK, HX OF 04/05/2007   ?     Past Surgical History:  Procedure Laterality Date  . BALLOON DILATION N/A 03/12/2013   Procedure: BALLOON DILATION;  Surgeon: Inda Castle, MD;  Location: [facility];  Service: Endoscopy;  Laterality: N/A;  . CATARACT EXTRACTION  2001   bilateral  . CYSTOSCOPY WITH URETHRAL DILATATION N/A 09/24/2015   Procedure: CYSTOSCOPY WITH COOK BALLOON DILATATION OF URETHRAL STRICTURE ;  Surgeon: Carolan Clines, MD;  Location: [facility];  Service: Urology;  Laterality: N/A;  . CYSTOSCOPY WITH URETHRAL DILATATION N/A 07/20/2016   Procedure: CYSTOSCOPY WITH URETHRAL DILATATION;  Surgeon: Alexis Frock, MD;  Location: [facility];  Service: Urology;  Laterality: N/A;  1 HOUR  . ESOPHAGOGASTRODUODENOSCOPY N/A 03/12/2013   Procedure: ESOPHAGOGASTRODUODENOSCOPY (EGD);  Surgeon: Inda Castle, MD;  Location: [facility];  Service: Endoscopy;  Laterality: N/A;  . HEMORRHOID SURGERY    . HERNIA REPAIR     inguinal x5  . KNEE SURGERY  arthroscopic   left  . PROSTATE CRYOABLATION     2000  . SHOULDER SURGERY  few years ago   left  . TRANSURETHRAL RESECTION OF PROSTATE N/A 03/19/2015   Procedure: TRANSURETHRAL RESECTION OF THE PROSTATE (TURP);  Surgeon: Carolan Clines, MD;  Location: [facility];  Service: Urology;  Laterality: N/A;     reports that he quit smoking about 64 years ago. His smoking use included cigarettes. He has never used smokeless tobacco. He reports that  he drinks alcohol. He reports that he does not use drugs.  Allergies  Allergen Reactions  . Sulfonamide Derivatives Other (See Comments)    Patient does not remember allergy symptoms    Family History  Problem Relation Age of Onset  . Breast cancer Mother   . Heart disease Father   . Heart attack Father   . Lymphoma Brother   . Stroke Neg Hx   . Hypertension Neg Hx     Prior to Admission medications   Medication Sig Start Date End Date Taking? Authorizing Provider  acetaminophen  (TYLENOL) 325 MG tablet Take 650 mg by mouth every morning.   Yes [provider]  clopidogrel (PLAVIX) 75 MG tablet Take 1 tablet (75 mg total) by mouth daily. 05/11/18  Yes Burchette, Alinda Sierras, MD  furosemide (LASIX) 20 MG tablet Take 1 tablet (20 mg total) by mouth daily as needed for fluid or edema. 01/02/18  Yes Marletta Lor, MD  latanoprost (XALATAN) 0.005 % ophthalmic solution Instill 1 drop into both eyes at bedtime 50/2=25 02/08/18  Yes [provider]  levothyroxine (SYNTHROID, LEVOTHROID) 50 MCG tablet TAKE 1 TABLET (50 MCG TOTAL) BY MOUTH DAILY. 08/30/17  Yes Marletta Lor, MD  metoprolol succinate (TOPROL-XL) 25 MG 24 hr tablet Take 1 tablet (25 mg total) by mouth at bedtime. 05/11/18  Yes Burchette, Alinda Sierras, MD  Multiple Vitamin (MULTIVITAMIN WITH MINERALS) TABS tablet Take 1 tablet by mouth 3 (three) times a week.   Yes [provider]  nitroGLYCERIN (NITROSTAT) 0.4 MG SL tablet Place 0.4 mg under the tongue every 5 (five) minutes as needed (ESOPHEAGEAL SPASPMS). Reported on 03/01/2016   Yes [provider]  pantoprazole (PROTONIX) 40 MG tablet Take 1 tablet (40 mg total) by mouth daily. 05/11/18  Yes Burchette, Alinda Sierras, MD  neomycin-polymyxin b-dexamethasone (MAXITROL) 3.5-10000-0.1 SUSP Place 2 drops into both eyes 4 (four) times daily. Patient not taking: Reported on 05/15/2018 10/20/17   Marletta Lor, MD    Physical Exam: Vitals:   05/15/18 0045 05/15/18 0130 05/15/18 0230 05/15/18 0337  BP: (!) 167/78 (!) 150/88 (!) 155/92 (!) 167/105  Pulse: 95 99 97 (!) 103  Resp: _0 Temp:    98.2 F (36.8 C)  TempSrc:    Oral  SpO2: 93% 98% 98% 99%  Weight:    67.9 kg (149 lb 12.8 oz)  Height:    _1  (1.575 m)      Constitutional: Moderately built and nourished. Vitals:   05/15/18 0045 05/15/18 0130 05/15/18 0230 05/15/18 0337  BP: (!) 167/78 (!) 150/88 (!) 155/92 (!) 167/105  Pulse: 95 99 97 (!) 103  Resp: _2 Temp:    98.2 F (36.8 C)  TempSrc:    Oral  SpO2: 93% 98% 98% 99%  Weight:    67.9 kg (149 lb 12.8 oz)  Height:    _3  (1.575 m)   Eyes: Anicteric no pallor. ENMT: No discharge from the ears eyes nose or mouth. Neck: No mass palpated no JVD appreciated. Respiratory: No rhonchi or crepitations. Cardiovascular: S1-S2 heard no murmurs appreciated. Abdomen: Soft nontender bowel sounds present. Musculoskeletal: No edema.  No joint effusion. Skin: No rash.  Skin appears warm. Neurologic: Alert awake oriented to time place and person.  Moves all extremities. Psychiatric: Appears normal.  Normal affect.   Labs on Admission: I have personally reviewed following labs and imaging studies  CBC: Recent Labs  Lab 05/14/18 2149  WBC 6.9  HGB 14.4  HCT 45.3  MCV 98.7  PLT 735   Basic Metabolic Panel: Recent Labs  Lab 05/14/18 2149  NA 138  K 4.1  CL 105  CO2 25  GLUCOSE 164*  BUN 20  CREATININE 1.69*  CALCIUM 9.1   GFR: Estimated Creatinine Clearance: 21.2 mL/min (A) (by C-G formula based on SCr of 1.69 mg/dL (H)). Liver Function Tests: Recent Labs  Lab 05/14/18 2149  AST 16  ALT 13*  ALKPHOS 72  BILITOT 0.6  PROT 6.2*  ALBUMIN 3.6   Recent Labs  Lab 05/14/18 2149  LIPASE 44   No results for input(s): AMMONIA in the last 168 hours. Coagulation Profile: No results for input(s): INR, PROTIME in the last 168 hours. Cardiac Enzymes: No results for input(s): CKTOTAL, CKMB, CKMBINDEX, TROPONINI in the last 168 hours. BNP (last 3 results) No results for input(s): PROBNP in the last 8760 hours. HbA1C: No results for input(s): HGBA1C in the last 72 hours. CBG: Recent Labs  Lab 05/15/18 0254  GLUCAP 133*   Lipid Profile: No results for input(s): CHOL, HDL, LDLCALC, TRIG, CHOLHDL, LDLDIRECT in the last 72 hours. Thyroid Function Tests: No results for input(s): TSH, T4TOTAL, FREET4, T3FREE, THYROIDAB in the last 72 hours. Anemia Panel: No results for  input(s): VITAMINB12, FOLATE, FERRITIN, TIBC, IRON, RETICCTPCT in the last 72 hours. Urine analysis:    Component Value Date/Time   COLORURINE YELLOW 08/27/2015 1930   APPEARANCEUR CLEAR 08/27/2015 1930   LABSPEC 1.009 08/27/2015 1930   PHURINE 7.5 08/27/2015 1930   GLUCOSEU NEGATIVE 08/27/2015 1930   HGBUR NEGATIVE 08/27/2015 1930   BILIRUBINUR NEGATIVE 08/27/2015 1930   KETONESUR NEGATIVE 08/27/2015 1930   PROTEINUR NEGATIVE 08/27/2015 1930   UROBILINOGEN 0.2 08/27/2015 1930   NITRITE NEGATIVE 08/27/2015 1930   LEUKOCYTESUR NEGATIVE 08/27/2015 1930   Sepsis Labs: _0 (procalcitonin:4,lacticidven:4) )No results found for this or any previous visit (from the past 240 hour(s)).   Radiological Exams on Admission: Dg Chest 2 View  Result Date: 05/14/2018 CLINICAL DATA:  Chest pain and abdominal pain x1 day. EXAM: CHEST - 2 VIEW COMPARISON:  11/15/2015 CXR FINDINGS: Stable cardiomegaly with mild aortic atherosclerosis. Air like lucency projects over the cardiac silhouette compatible with a large hiatal hernia similar in appearance to previous study. Low lung volumes are noted with mild vascular congestion. Left basilar atelectasis is seen. Osteoarthritis of the [facility] and glenohumeral joints bilaterally. IMPRESSION: Stable cardiomegaly with aortic atherosclerosis. Superimposed large hiatal hernia is suggested. Left basilar atelectasis is noted. Electronically Signed   By: Ashley Royalty M.D.   On: 05/14/2018 22:43   Ct Angio Chest/abd/pel For Dissection W And/or Wo Contrast  Result Date: 05/15/2018 CLINICAL DATA:  Chest pain, diaphoresis and hypotension. EXAM: CT ANGIOGRAPHY CHEST, ABDOMEN AND PELVIS TECHNIQUE: Multidetector CT imaging through the chest, abdomen and pelvis was performed using the standard protocol during bolus administration of intravenous contrast. Multiplanar reconstructed images and MIPs were obtained and reviewed to evaluate the vascular anatomy. CONTRAST:  12m ISOVUE-370  IOPAMIDOL (ISOVUE-370) INJECTION 76% COMPARISON:  None. FINDINGS: CTA CHEST FINDINGS Cardiovascular: Atherosclerotic origins of the great vessels. Common arterial branch of the right brachiocephalic and left common carotid arteries. Nonaneurysmal atherosclerosis of the thoracic aorta without dissection. Small focus of mixed soft and hard plaque is noted along the mid thoracic aortic arch along the lateral wall. Main pulmonary arteries 3.1 cm in caliber. No acute pulmonary embolus is identified to the segmental level. Heart is normal  in size without pericardial effusion or thickening. Left main and three-vessel coronary arteriosclerosis. Mediastinum/Nodes: No enlarged mediastinal, hilar, or axillary lymph nodes. Thyroid gland, trachea, and esophagus demonstrate no significant findings. Lungs/Pleura: Eventration of the left hemidiaphragm with adjacent atelectasis. Dependent atelectasis noted bilaterally. 3 mm nodular density in the right lower lobe may represent a branch point for pulmonary vessel versus tiny pulmonary nodule, series 6/31. Musculoskeletal: Is seen at T2-3 degenerative disc disease. No acute nor suspicious osseous abnormalities. Review of the MIP images confirms the above findings. CTA ABDOMEN AND PELVIS FINDINGS VASCULAR Aorta: Ectatic distal descending thoracic aorta. Nonaneurysmal abdominal aorta with atherosclerosis. Celiac: Patent without evidence of aneurysm, dissection, vasculitis or significant stenosis. Minimal plaque at the origin of the celiac axis without significant luminal narrowing. SMA: Patent without evidence of aneurysm, dissection, or vasculitis. Mix of soft and hard plaque within the more distal SMA with 50% luminal narrowing, series 10/148. Renals: Patent single renal arteries bilaterally with atherosclerotic origins on the left. No significant stenosis, fibromuscular dysplasia or aneurysm. IMA: Patent Inflow: Mild atherosclerosis of the common iliac arteries and branch vessels.  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 Veins: No obvious venous abnormality within the limitations of this arterial phase study. Review of the MIP images confirms the above findings. NON-VASCULAR Hepatobiliary: No focal liver abnormality is seen. No gallstones, gallbladder wall thickening, or biliary dilatation. Pancreas: Atrophic pancreas without mass, ductal dilatation or inflammation. Spleen: Normal in size without focal abnormality. Adrenals/Urinary Tract: Normal bilateral adrenal glands. Mild irregular cortical scarring of both kidneys. Bilateral water attenuating cysts identified the largest on the left measuring 4.5 cm in diameter. Smaller parapelvic right-sided renal cysts are present measuring up to 2.3 cm. No hydroureteronephrosis. Contrast noted within the dependent aspect of the urinary bladder. Stomach/Bowel: Physiologic distention of the stomach. Normal small bowel rotation without obstruction or inflammation. Scattered colonic diverticulosis without acute diverticulitis. Lymphatic: No lymphadenopathy. Reproductive: Enlarged prostate measuring up to 5 cm with TURP defect. Other: No free air nor free fluid.  Bilateral herniorrhaphies. Musculoskeletal: Lumbar spondylosis with degenerative disc disease of the lumbar spine from L1 through S1. Review of the MIP images confirms the above findings. IMPRESSION: Vascular: 1. Nonaneurysmal atherosclerotic thoracic aorta without dissection. 2. No acute pulmonary embolus. 3. Left main and three-vessel coronary arteriosclerosis. 4. Aortoiliac branch vessel atherosclerosis. 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 5. Moderate plaque also noted in the distal SMA with narrowing the lumen up to 50%. Nonvascular: 1. 2. 3 mm right lower lobe nodular density. No  follow-up needed if patient is low-risk. Non-contrast chest CT can be considered in 12 months if patient is high-risk. This recommendation follows the consensus statement: Guidelines for Management of Incidental Pulmonary Nodules Detected on CT Images: From the Fleischner Society 2017; Radiology 2017; 284:228-243. Bilateral renal cysts the largest on the left measuring up to 4.5 cm. 3. Enlarged prostate TURP defect. 4. Thoracolumbar spondylosis. 5. Colonic diverticulosis without acute diverticulitis. Electronically Signed   By: Ashley Royalty M.D.   On: 05/15/2018 01:54   US Abdomen Limited Ruq  Result Date: 05/14/2018 CLINICAL DATA:  84 year old male with right upper quadrant abdominal pain. EXAM: ULTRASOUND ABDOMEN LIMITED RIGHT UPPER QUADRANT COMPARISON:  None. FINDINGS: Gallbladder: No gallstones or wall thickening visualized. No sonographic Murphy sign noted by sonographer. Common bile duct: Diameter: 3 mm Liver: Unremarkable  as visualized. Portal vein is patent on color Doppler imaging with normal direction of blood flow towards the liver. IMPRESSION: Unremarkable right upper quadrant ultrasound. Electronically Signed   By: Anner Crete M.D.   On: 05/14/2018 23:53    EKG: Independently reviewed.  Normal sinus rhythm with lateral leads showing ST depression.  Assessment/Plan Principal Problem:   Chest pain Active Problems:   Hypothyroidism   Chronic diastolic CHF (congestive heart failure) (H[facility])   Essential hypertension    1. Chest pain -mostly in the right-sided chest pain.  Improved with pain relief medications.  Given that patient has ST depression in the lateral leads and CT scan showing left main disease will cycle cardiac markers check 2D echo and consult cardiology.  Patient on Plavix.  Nitroglycerin continue metoprolol. 2. Hypertension uncontrolled on metoprolol for now.  Closely follow blood pressure trends. 3. Hypothyroidism on Synthroid. 4. History of diastolic CHF on PRN  Lasix. 5. Chronic kidney disease stage III -creatinine appears to be at baseline. 6. History of stroke on Plavix.   DVT prophylaxis: Lovenox. Code Status: Full code. Family Communication: Family at the bedside. Disposition Plan: Home. Consults called: Cardiology. Admission status: Observation.   Rise Patience MD [facility] Pager [phone].  If 7PM-7AM, please contact night-coverage [email or web address] Password [facility]  05/15/2018, 4:18 AM

## 2018-05-16 ENCOUNTER — Ambulatory Visit: Payer: Self-pay

## 2018-05-16 ENCOUNTER — Encounter: Payer: Self-pay | Admitting: Internal Medicine

## 2018-05-16 ENCOUNTER — Ambulatory Visit (INDEPENDENT_AMBULATORY_CARE_PROVIDER_SITE_OTHER): Payer: Medicare Other | Admitting: Internal Medicine

## 2018-05-16 VITALS — BP 110/68 | HR 82 | Temp 97.8°F

## 2018-05-16 DIAGNOSIS — N183 Chronic kidney disease, stage 3 unspecified: Secondary | ICD-10-CM

## 2018-05-16 DIAGNOSIS — I10 Essential (primary) hypertension: Secondary | ICD-10-CM

## 2018-05-16 DIAGNOSIS — I5032 Chronic diastolic (congestive) heart failure: Secondary | ICD-10-CM | POA: Diagnosis not present

## 2018-05-16 DIAGNOSIS — R1031 Right lower quadrant pain: Secondary | ICD-10-CM

## 2018-05-16 MED ORDER — POLYETHYLENE GLYCOL 3350 17 GM/SCOOP PO POWD: 17.0000 g | Freq: Two times a day (BID) | ORAL | 1 refills | Status: DC | PRN

## 2018-05-16 NOTE — Telephone Encounter (Signed)
Pt. Reports abdominal pain woke him up this morning - right lower quadrant of abdomen. Pain is "5". Does not radiate."Diffuse pain " per pt. "Some nausea." Appointment made for this morning. Reason for Disposition . [1] MODERATE pain (e.g., interferes with normal activities) AND [2] pain comes and goes (cramps) AND [3] present > 24 hours  (Exception: pain with Vomiting or Diarrhea - see that Guideline)  Answer Assessment - Initial Assessment Questions 1. LOCATION: "Where does it hurt?"      Right lower quadrant  2. RADIATION: "Does the pain shoot anywhere else?" (e.g., chest, back)     No 3. ONSET: "When did the pain begin?" (Minutes, hours or days ago)      Started this morning 4. SUDDEN: "Gradual or sudden onset?"     Sudden 5. PATTERN "Does the pain come and go, or is it constant?"    - If constant: "Is it getting better, staying the same, or worsening?"      (Note: Constant means the pain never goes away completely; most serious pain is constant and it progresses)     - If intermittent: "How long does it last?" "Do you have pain now?"     (Note: Intermittent means the pain goes away completely between bouts)     Constant 6. SEVERITY: "How bad is the pain?"  (e.g., Scale 1-10; mild, moderate, or severe)    - MILD (1-3): doesn't interfere with normal activities, abdomen soft and not tender to touch     - MODERATE (4-7): interferes with normal activities or awakens from sleep, tender to touch     - SEVERE (8-10): excruciating pain, doubled over, unable to do any normal activities       5 7. RECURRENT SYMPTOM: "Have you ever had this type of abdominal pain before?" If so, ask: "When was the last time?" and "What happened that time?"      No 8. CAUSE: "What do you think is causing the abdominal pain?"     Appendix 9. RELIEVING/AGGRAVATING FACTORS: "What makes it better or worse?" (e.g., movement, antacids, bowel movement)     Nothing 10. OTHER SYMPTOMS: "Has there been any vomiting,  diarrhea, constipation, or urine problems?"       No  Protocols used: ABDOMINAL PAIN - MALE-A-AH

## 2018-05-16 NOTE — Patient Instructions (Signed)
Drink as much fluid as you  can tolerate over the next few days  MiraLAX in 12 ounces of water use every 6 hours until resulting in a bowel movement  Increase activity as tolerated  Okay to continue stool softener  Call if you develop any worsening symptoms nausea or vomiting

## 2018-05-16 NOTE — Progress Notes (Signed)
Subjective:    Patient ID: Robert Rowe, male    DOB: 1918/03/18, 84 y.o.   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  He was discharged to yesterday afternoon after an extensive evaluation.  ED records including laboratory studies and imaging studies reviewed.  At that time he presented with right-sided chest wall discomfort. He awoke at 5:00 this morning with right lower quadrant pain denies any nausea or vomiting.  He states he has not had a bowel movement in 2 or 3 days.  He also states that he was treated with morphine during his hospital stay.  He was concerned about possible acute appendicitis. Medical problems include essential hypertension as well as chronic diastolic heart failure  Past Medical History:  Diagnosis Date  . BENIGN PROSTATIC HYPERTROPHY 04/05/2007  . 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 CHEST DISCOMFORT 04/25/2009  . Chronic diastolic CHF (congestive heart failure) ([place])    a. Echo 12/16: mild LVH, EF 55-60%, no RWMA, Gr 1 DD, mild AI, MAC, mild LAE, normal RVF, PASP 37 mmHg  . Chronic kidney disease    chronic   . DJD (degenerative joint disease)   . Dysrhythmia    pvc's   . GERD 12/03/2007  . Hiatal hernia   . History of nuclear stress test    Myoview 1/17: EF 53%, normal perfusion; Low Risk  . HYPERLIPIDEMIA 04/05/2007  . Hypothyroidism   . Osteoarth NOS-Unspec 04/05/2007  . Palpitations 02/12/2010  . PEDAL EDEMA 05/01/2008  . Rheumatoid arthritis(714.0) 04/05/2007  . Shortness of breath dyspnea   . TRANSIENT ISCHEMIC ATTACK, HX OF 04/05/2007   ?      Social History   Socioeconomic History  . Marital status: Married    Spouse name: Not on file  . Number of children: Not on file  . Years of education: Not on file  . Highest education level: Not on file  Occupational History  . Not on file  Social Needs  . Financial resource strain: Not on file  . Food insecurity:     Worry: Not on file    Inability: Not on file  . Transportation needs:    Medical: Not on file    Non-medical: Not on file  Tobacco Use  . Smoking status: Former Smoker    Types: Cigarettes    Last attempt to quit: 08/13/1953    Years since quitting: 64.8  . Smokeless tobacco: Never Used  Substance and Sexual Activity  . Alcohol use: Yes    Comment: occ vodka  . Drug use: No  . Sexual activity: Not on file  Lifestyle  . Physical activity:    Days per week: Not on file    Minutes per session: Not on file  . Stress: Not on file  Relationships  . Social connections:    Talks on phone: Not on file    Gets together: Not on file    Attends religious service: Not on file    Active member of club or organization: Not on file    Attends meetings of clubs or organizations: Not on file    Relationship status: Not on file  . Intimate partner violence:    Fear of current or ex partner: Not on file    Emotionally abused: Not on file    Physically abused: Not on file    Forced sexual activity: Not on file  Other  Topics Concern  . Not on file  Social History Narrative  . Not on file    Past Surgical History:  Procedure Laterality Date  . BALLOON DILATION N/A 03/12/2013   Procedure: BALLOON DILATION;  Surgeon: Inda Castle, MD;  Location: [facility];  Service: Endoscopy;  Laterality: N/A;  . CATARACT EXTRACTION  2001   bilateral  . CYSTOSCOPY WITH URETHRAL DILATATION N/A 09/24/2015   Procedure: CYSTOSCOPY WITH COOK BALLOON DILATATION OF URETHRAL STRICTURE ;  Surgeon: Carolan Clines, MD;  Location: [facility];  Service: Urology;  Laterality: N/A;  . CYSTOSCOPY WITH URETHRAL DILATATION N/A 07/20/2016   Procedure: CYSTOSCOPY WITH URETHRAL DILATATION;  Surgeon: Alexis Frock, MD;  Location: [facility];  Service: Urology;  Laterality: N/A;  1 HOUR  . ESOPHAGOGASTRODUODENOSCOPY N/A 03/12/2013   Procedure: ESOPHAGOGASTRODUODENOSCOPY (EGD);  Surgeon: Inda Castle, MD;  Location: [facility];  Service: Endoscopy;  Laterality: N/A;  . HEMORRHOID SURGERY    . HERNIA REPAIR     inguinal x5  . KNEE SURGERY  arthroscopic   left  . PROSTATE CRYOABLATION     2000  . SHOULDER SURGERY  few years ago   left  . TRANSURETHRAL RESECTION OF PROSTATE N/A 03/19/2015   Procedure: TRANSURETHRAL RESECTION OF THE PROSTATE (TURP);  Surgeon: Carolan Clines, MD;  Location: [facility];  Service: Urology;  Laterality: N/A;    Family History  Problem Relation Age of Onset  . Breast cancer Mother   . Heart disease Father   . Heart attack Father   . Lymphoma Brother   . Stroke Neg Hx   . Hypertension Neg Hx     Allergies  Allergen Reactions  . Sulfonamide Derivatives Other (See Comments)    Patient does not remember allergy symptoms    Current Outpatient Medications on File Prior to Visit  Medication Sig Dispense Refill  . acetaminophen (TYLENOL) 325 MG tablet Take 650 mg by mouth every morning.    . clopidogrel (PLAVIX) 75 MG tablet Take 1 tablet (75 mg total) by mouth daily. 90 tablet 1  . furosemide (LASIX) 20 MG tablet Take 1 tablet (20 mg total) by mouth daily as needed for fluid or edema. 90 tablet 0  . latanoprost (XALATAN) 0.005 % ophthalmic solution Instill 1 drop into both eyes at bedtime 50/2=25  99  . levothyroxine (SYNTHROID, LEVOTHROID) 50 MCG tablet TAKE 1 TABLET (50 MCG TOTAL) BY MOUTH DAILY. 90 tablet 1  . metoprolol succinate (TOPROL-XL) 25 MG 24 hr tablet Take 1 tablet (25 mg total) by mouth at bedtime. 90 tablet 1  . Multiple Vitamin (MULTIVITAMIN WITH MINERALS) TABS tablet Take 1 tablet by mouth 3 (three) times a week.    . nitroGLYCERIN (NITROSTAT) 0.4 MG SL tablet Place 0.4 mg under the tongue every 5 (five) minutes as needed (ESOPHEAGEAL SPASPMS). Reported on 03/01/2016    . pantoprazole (PROTONIX) 40 MG tablet Take 1 tablet (40 mg total) by mouth daily. 90 tablet 1   No current facility-administered medications on file prior to visit.     BP 110/68    Pulse 82   Temp 97.8 F (36.6 C) (Oral)     Review of Systems  Constitutional: Negative for appetite change, chills, fatigue and fever.  HENT: Negative for congestion, dental problem, ear pain, hearing loss, sore throat, tinnitus, trouble swallowing and voice change.   Eyes: Negative for pain, discharge and visual disturbance.  Respiratory: Negative for cough, chest tightness, wheezing and stridor.   Cardiovascular: Negative for chest  pain, palpitations and leg swelling.  Gastrointestinal: Positive for abdominal pain. Negative for abdominal distention, blood in stool, constipation, diarrhea, nausea and vomiting.  Genitourinary: Negative for difficulty urinating, discharge, flank pain, genital sores, hematuria and urgency.  Musculoskeletal: Negative for arthralgias, back pain, gait problem, joint swelling, myalgias and neck stiffness.  Skin: Negative for rash.  Neurological: Negative for dizziness, syncope, speech difficulty, weakness, numbness and headaches.  Hematological: Negative for adenopathy. Does not bruise/bleed easily.  Psychiatric/Behavioral: Negative for behavioral problems and dysphoric mood. The patient is not nervous/anxious.        Objective:   Physical Exam  Constitutional: He is oriented to person, place, and time. He appears well-developed and well-nourished. He does not appear ill.  Afebrile Clinically appears well Blood pressure 110/70    HENT:  Head: Normocephalic.  Right Ear: External ear normal.  Left Ear: External ear normal.  Eyes: Conjunctivae and EOM are normal.  Neck: Normal range of motion.  Cardiovascular: Normal rate and normal heart sounds.  Pulmonary/Chest: Breath sounds normal.  Abdominal: Soft. Bowel sounds are normal. He exhibits no distension and no mass. There is no tenderness. There is no rebound and no guarding.  Musculoskeletal: Normal range of motion. He exhibits no edema or tenderness.  Neurological: He is alert and oriented to  person, place, and time.  Psychiatric: He has a normal mood and affect. His behavior is normal.          Assessment & Plan:   History of right lower quadrant pain.  Pain-free at present and clinical exam is unremarkable he is afebrile.  Will observe Probable constipation.  May be a factor with his abdominal discomfort.  Patient has been asked to be more active physically force fluids and to try MiraLAX 2 or 3 times daily until he has a bowel movement  essential hypertension stable Chronic diastolic heart failure stable   Patient report any new or worsening symptoms He has been asked to call if he develops worsening pain nausea vomiting or fever  Marletta Lor

## 2018-05-16 NOTE — Telephone Encounter (Signed)
Noted  

## 2018-05-18 ENCOUNTER — Other Ambulatory Visit: Payer: Self-pay | Admitting: *Deleted

## 2018-05-18 MED ORDER — LEVOTHYROXINE SODIUM 50 MCG PO TABS: ORAL_TABLET | ORAL | 2 refills | Status: DC

## 2018-05-21 ENCOUNTER — Other Ambulatory Visit: Payer: Self-pay

## 2018-05-21 ENCOUNTER — Ambulatory Visit: Payer: Medicare Other | Admitting: Neurology

## 2018-05-21 VITALS — BP 90/54 | HR 88 | Ht 62.0 in | Wt 146.0 lb

## 2018-05-21 DIAGNOSIS — G5602 Carpal tunnel syndrome, left upper limb: Secondary | ICD-10-CM | POA: Diagnosis not present

## 2018-05-21 DIAGNOSIS — F0390 Unspecified dementia without behavioral disturbance: Secondary | ICD-10-CM | POA: Diagnosis not present

## 2018-05-21 DIAGNOSIS — F03A0 Unspecified dementia, mild, without behavioral disturbance, psychotic disturbance, mood disturbance, and anxiety: Secondary | ICD-10-CM

## 2018-05-21 NOTE — Progress Notes (Signed)
NEUROLOGY FOLLOW UP OFFICE NOTE  Robert Rowe [id number]  DOB: [birth year]  HISTORY OF PRESENT ILLNESS: I had the pleasure of seeing [name] in follow-up in the neurology clinic on 05/21/2018. He is alone in the office today. The patient was last seen 6 months ago for several neurological symptoms. He was initially seen for memory changes, but has also later on reported episodes of passing out, as well as bilateral leg weakness with slurred speech. MRA head and neck showed intracranial stenosis, with significant intracranial stenosis in basilar, right ICA cavernous/supraclinoid junction, moderate narrowing in bilateral ACA, right ACA, distal right vertebral, bilateral PCA stenosis. Recurrent symptoms concerning for hypoperfusion causing recurrent TIAs. He is taking Plavix daily. He denies any further episodes of unresponsiveness or leg weakness. He was in the ER for chest pain last June and was able to tell me of his hospital stay. He reports worsening memory, information is lost within 2-3 seconds. He states he has forgotten medicine. His wife has been managing bills for the past 2 years, he stopped driving 2 years ago. He continues to manage his own medications. He is mostly concerned about numbness in the 2nd and 3rd digits of his left hand, there is mild weakness. He wears his wrist splint every night. He has numbness in the soles of both feet. He has occasional back pain. He fell getting out of the tub 6 months ago. He is able to bathe and dress independently. He denies any headaches, dizziness, diplopia.  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 He reports that he started having recurrent symptoms in 2001, he was driving to his dentist when a peculiar feeling came over him, "running round in my head," for a couple of minutes. He denied any focal symptoms, he was  able to drive without any confusion. He was evaluated at that time with an MRI brain reported as normal. He had another episode in 2004 with a normal EEG. He reports a history of hypoglycemia where he gets shaky, and that his blood glucose levels and BP were unremarkable during these episodes. He did well for several years until 2013 when he had 4 spells of confusion where a sensation of "ideas repeating over and over in his brain" occurred. There is note that he may have had weakness in the right arm with one of these spells. No associated headache. He was evaluated by neurologist [name], routine EEG normal. MRI brain did not show any acute changes, I personally reviewed scan which showed diffuse atrophy with likely ex vacuo ventriculomegaly, MRA head showed mild intracranial atherosclerosis in the left PCA, distal flow preserved. He again had no further episodes for another 3 years until March 2016 while his wife was driving, she asked him to roll his window down. He reports he was taking a nap, as as he was waking up, he was trying to reach for the window with his right hand but could not find it (eyes were closed per patient because he was just waking up), then his wife noticed he seemed to be waving his right hand in the air and was trying to talk but could not get words out. He recalls this episode and denied any confusion. It only lasted a few seconds. The second episode occurred a week or so after, he woke up from his nap and was again unable to  talk for a few seconds. The last episode occurred 02/22/15 while in a restaurant. They report that he had not eaten that evening, he spoke at a meeting, then went to the restaurant after. He recalls feeling sleepy and went to sleep, but his wife reports that he suddenly slumped down then to the wall on his right side, eyes closed for a few seconds. She called his name and he started to sit up and drank from his cup. His wife reports he was perfectly normal soon after  and did not appear confused or drowsy. He denied any associated focal numbness/tingling, or post-event weakness, no headaches, dizziness. He denies any other episodes of speech difficulties. He feels these may be hypoglycemic episodes, and has been advised to use a glucometer in the past but has not done this.   His routine EEG was normal. 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 I had discussed findings with [name] on the phone, likely due to small vessel disease, and controlling vascular risk factors. He is aware of elevated cholesterol but did not want to start any medication. He continues on Plavix taken 5 times a week due to easy bruising and bleeding (when he was having TURP).   On his visit in November 2016, his wife and son reported a change in symptoms over the course of a month. On 08/27/15, he was alone at home watching TV, when he suddenly started "feeling down," then he broke out in a sweat. He felt temporarily confused. He called his wife then called EMS and was brought to [facility] ER, on arrival he was oriented x 3, BP 130/82, HR 78. EKG reported sinus rhythm, multiple premature compexes, vent; borderline repolarization abnormality; no significant change since last tracing. CBG 107, CBC and BMP unremarkable except for creatinine of 1.41. Urinalysis negative. He wanted to go home and was discharged at baseline. His family reports that since then, he has had 8 episodes where he suddenly gets weak and if standing, would start listing to his left. His wife tried to help him one time and they both ended up on the floor. He could not move either leg. His son came one time and could not get him up, it appeared he could not move his legs, so he crawled to a stool, then was able to get to his knees, then after a few minutes he felt better and  stood up and started walking. Last episode was 3 days ago. His wife also notices that his speech would get very slurred when there episodes occur.   PAST MEDICAL HISTORY: Past Medical History:  Diagnosis Date  . BENIGN PROSTATIC HYPERTROPHY 04/05/2007  . 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 CHEST DISCOMFORT 04/25/2009  . Chronic diastolic CHF (congestive heart failure) ([place])    a. Echo 12/16: mild LVH, EF 55-60%, no RWMA, Gr 1 DD, mild AI, MAC, mild LAE, normal RVF, PASP 37 mmHg  . Chronic kidney disease    chronic   . DJD (degenerative joint disease)   . Dysrhythmia    pvc's   . GERD 12/03/2007  . Hiatal hernia   . History of nuclear stress test    Myoview 1/17: EF 53%, normal perfusion; Low Risk  . HYPERLIPIDEMIA 04/05/2007  . Hypothyroidism   . Osteoarth NOS-Unspec 04/05/2007  . Palpitations 02/12/2010  .  PEDAL EDEMA 05/01/2008  . Rheumatoid arthritis(714.0) 04/05/2007  . Shortness of breath dyspnea   . TRANSIENT ISCHEMIC ATTACK, HX OF 04/05/2007   ?     MEDICATIONS: Current Outpatient Medications on File Prior to Visit  Medication Sig Dispense Refill  . acetaminophen (TYLENOL) 325 MG tablet Take 650 mg by mouth every morning.    . clopidogrel (PLAVIX) 75 MG tablet Take 1 tablet (75 mg total) by mouth daily. 90 tablet 1  . furosemide (LASIX) 20 MG tablet Take 1 tablet (20 mg total) by mouth daily as needed for fluid or edema. 90 tablet 0  . latanoprost (XALATAN) 0.005 % ophthalmic solution Instill 1 drop into both eyes at bedtime 50/2=25  99  . levothyroxine (SYNTHROID, LEVOTHROID) 50 MCG tablet TAKE 1 TABLET (50 MCG TOTAL) BY MOUTH DAILY. 90 tablet 2  . metoprolol succinate (TOPROL-XL) 25 MG 24 hr tablet Take 1 tablet (25 mg total) by mouth at bedtime. 90 tablet 1  . Multiple Vitamin (MULTIVITAMIN WITH MINERALS) TABS tablet Take 1 tablet by mouth 3 (three) times a week.    . nitroGLYCERIN (NITROSTAT) 0.4 MG SL tablet Place 0.4 mg under the tongue every 5  (five) minutes as needed (ESOPHEAGEAL SPASPMS). Reported on 03/01/2016    . pantoprazole (PROTONIX) 40 MG tablet Take 1 tablet (40 mg total) by mouth daily. 90 tablet 1  . polyethylene glycol powder (GLYCOLAX/MIRALAX) powder Take 17 g by mouth 2 (two) times daily as needed. 3350 g 1   No current facility-administered medications on file prior to visit.     ALLERGIES: Allergies  Allergen Reactions  . Sulfonamide Derivatives Other (See Comments)    Patient does not remember allergy symptoms    FAMILY HISTORY: Family History  Problem Relation Age of Onset  . Breast cancer Mother   . Heart disease Father   . Heart attack Father   . Lymphoma Brother   . Stroke Neg Hx   . Hypertension Neg Hx     SOCIAL HISTORY: Social History   Socioeconomic History  . Marital status: Married    Spouse name: Not on file  . Number of children: Not on file  . Years of education: Not on file  . Highest education level: Not on file  Occupational History  . Not on file  Social Needs  . Financial resource strain: Not on file  . Food insecurity:    Worry: Not on file    Inability: Not on file  . Transportation needs:    Medical: Not on file    Non-medical: Not on file  Tobacco Use  . Smoking status: Former Smoker    Types: Cigarettes    Last attempt to quit: 08/13/1953    Years since quitting: 64.8  . Smokeless tobacco: Never Used  Substance and Sexual Activity  . Alcohol use: Yes    Comment: occ vodka  . Drug use: No  . Sexual activity: Not on file  Lifestyle  . Physical activity:    Days per week: Not on file    Minutes per session: Not on file  . Stress: Not on file  Relationships  . Social connections:    Talks on phone: Not on file    Gets together: Not on file    Attends religious service: Not on file    Active member of club or organization: Not on file    Attends meetings of clubs or organizations: Not on file    Relationship status: Not on file  .  Intimate partner  violence:    Fear of current or ex partner: Not on file    Emotionally abused: Not on file    Physically abused: Not on file    Forced sexual activity: Not on file  Other Topics Concern  . Not on file  Social History Narrative  . Not on file    REVIEW OF SYSTEMS: Constitutional: No fevers, chills, or sweats, no generalized fatigue, change in appetite Eyes: No visual changes, double vision, eye pain Ear, nose and throat: No hearing loss, ear pain, nasal congestion, sore throat Cardiovascular: No chest pain, palpitations Respiratory:  No shortness of breath at rest or with exertion, wheezes GastrointestinaI: No nausea, vomiting, diarrhea, abdominal pain, fecal incontinence Genitourinary:  No dysuria, urinary retention or frequency Musculoskeletal:  No neck pain, +back pain Integumentary: No rash, pruritus, skin lesions Neurological: as above Psychiatric: No depression, insomnia, anxiety Endocrine: No palpitations, fatigue, diaphoresis, mood swings, change in appetite, change in weight, increased thirst Hematologic/Lymphatic:  No anemia, purpura, petechiae. Allergic/Immunologic: no itchy/runny eyes, nasal congestion, recent allergic reactions, rashes  PHYSICAL EXAM: Vitals:   05/21/18 1607  BP: (!) 90/54  Pulse: 88  SpO2: (!) 89%   General: No acute distress Head:  Normocephalic/atraumatic Neck: supple, no paraspinal tenderness, full range of motion Heart:  Regular rate and rhythm Lungs:  Clear to auscultation bilaterally Back: No paraspinal tenderness Skin/Extremities: No rash, no edema Neurological Exam: alert and oriented to person, place, and time. No aphasia or dysarthria. Fund of knowledge is appropriate.  Recent and remote memory are impaired. 0/3 delayed recall.  Attention and concentration are normal.    Able to name objects and repeat phrases. Cranial nerves: Pupils equal, round, reactive to light.  Extraocular movements intact with no nystagmus. Visual fields full.  Facial sensation intact. No facial asymmetry. Tongue, uvula, palate midline.  Motor: Bulk and tone normal, muscle strength 5/5 throughout except 4/5 left APB, no pronator drift.  Sensation to light touch, pin, cold on both UE, LE, decreased vibration to right knee. No extinction to double simultaneous stimulation.  Deep tendon reflexes unable to elicit reflexes throughout, toes downgoing.  Finger to nose testing intact.  Gait slow and cautious, no ataxia. Negative Tinel sign on wrists.   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 He has had an unremarkable MRI brain and routine EEG in 2013, symptom-free until March 2016, with the last episode, he apparently very briefly slumped down to his right side. His routine EEG was normal. 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 In November 2016, his family reported episodes of bilateral leg weakness and slurred speech. MRA head and neck showed significant intracranial stenosis in the anterior and posterior circulation. He had another episode of loss of consciousness last 06/26/16 concerning for vertebrobasilar TIA. He is on Plavix. He does not want to start a statin. He denies any further similar spells since 2017. His main concerns today include memory loss with difficulty completing complex tasks, consistent with mild dementia. He also has signs of left carpal tunnel syndrome, he does not want to do any surgery such as carpal tunnel release, we discussed that if symptoms worsen, we can do an EMG/NCV of the left UE and send a referral to Hand Surgery if CTS is confirmed. Continue using wrist splint.  He does not drive. He will follow-up in 6 months and knows to call for any changes.  Thank you for allowing me to participate in his care.   Please do not hesitate to call for any questions or concerns.  The duration of this appointment visit was 30 minutes of face-to-face time with the patient.  Greater than 50% of this time was spent in counseling, explanation of diagnosis, planning of further management, and coordination of care.   Ellouise Newer, M.D.   CC: Dr. Elease Hashimoto

## 2018-05-21 NOTE — Patient Instructions (Signed)
1. If the symptoms on your left hand start becoming more bothersome, call our office and we will schedule a nerve test and then send referral to a hand surgeon  2. Follow-up in 6 months, call for any changes  FALL PRECAUTIONS: Be cautious when walking. Scan the area for obstacles that may increase the risk of trips and falls. When getting up in the mornings, sit up at the edge of the bed for a few minutes before getting out of bed. Consider elevating the bed at the head end to avoid drop of blood pressure when getting up. Walk always in a well-lit room (use night lights in the walls). Avoid area rugs or power cords from appliances in the middle of the walkways. Use a walker or a cane if necessary and consider physical therapy for balance exercise. Get your eyesight checked regularly.  HOME SAFETY: Consider the safety of the kitchen when operating appliances like stoves, microwave oven, and blender. Consider having supervision and share cooking responsibilities until no longer able to participate in those. Accidents with firearms and other hazards in the house should be identified and addressed as well.  ABILITY TO BE LEFT ALONE: If patient is unable to contact 911 operator, consider using LifeLine, or when the need is there, arrange for someone to stay with patients. Smoking is a fire hazard, consider supervision or cessation. Risk of wandering should be assessed by caregiver and if detected at any point, supervision and safe proof recommendations should be instituted.  MEDICATION SUPERVISION: Inability to self-administer medication needs to be constantly addressed. Implement a mechanism to ensure safe administration of the medications.  RECOMMENDATIONS FOR ALL PATIENTS WITH MEMORY PROBLEMS: 1. Continue to exercise (Recommend 30 minutes of walking everyday, or 3 hours every week) 2. Increase social interactions - continue going to [place] and enjoy social gatherings with friends and family 3. Eat  healthy, avoid fried foods and eat more fruits and vegetables 4. Maintain adequate blood pressure, blood sugar, and blood cholesterol level. Reducing the risk of stroke and cardiovascular disease also helps promoting better memory. 5. Avoid stressful situations. Live a simple life and avoid aggravations. Organize your time and prepare for the next day in anticipation. 6. Sleep well, avoid any interruptions of sleep and avoid any distractions in the bedroom that may interfere with adequate sleep quality 7. Avoid sugar, avoid sweets as there is a strong link between excessive sugar intake, diabetes, and cognitive impairment The Mediterranean diet has been shown to help patients reduce the risk of progressive memory disorders and reduces cardiovascular risk. This includes eating fish, eat fruits and green leafy vegetables, nuts like almonds and hazelnuts, walnuts, and also use olive oil. Avoid fast foods and fried foods as much as possible. Avoid sweets and sugar as sugar use has been linked to worsening of memory function.  There is always a concern of gradual progression of memory problems. If this is the case, then we may need to adjust level of care according to patient needs. Support, both to the patient and caregiver, should then be put into place.

## 2018-05-21 NOTE — Patient Outreach (Signed)
[facility]) Care Management  05/21/2018  Robert Rowe [birth year] [id number]    Telephone Screen  Referral Date:05/08/18 Referral Source:EMMI Prevent Referral Reason:"A-fib" Insurance:[employer] Medicare   Multiple attempts to establish contact with patient without success. No response from letter mailed to patient. Case is being closed at this time.    Plan: RN CM will close case at this time.   [name], RN,BSN,CCM [facility] Telephonic Care Management Coordinator Direct Phone: [phone] Toll Free: [phone] Fax: [phone]

## 2018-05-25 ENCOUNTER — Encounter: Payer: Self-pay | Admitting: Neurology

## 2018-06-01 ENCOUNTER — Inpatient Hospital Stay: Payer: Medicare Other | Admitting: Family Medicine

## 2018-06-18 ENCOUNTER — Encounter: Payer: Self-pay | Admitting: Internal Medicine

## 2018-06-18 ENCOUNTER — Ambulatory Visit: Payer: Medicare Other | Admitting: Internal Medicine

## 2018-06-18 VITALS — BP 127/83 | HR 84 | Ht 62.0 in | Wt 149.2 lb

## 2018-06-18 DIAGNOSIS — I5032 Chronic diastolic (congestive) heart failure: Secondary | ICD-10-CM | POA: Diagnosis not present

## 2018-06-18 DIAGNOSIS — I10 Essential (primary) hypertension: Secondary | ICD-10-CM | POA: Diagnosis not present

## 2018-06-18 DIAGNOSIS — I493 Ventricular premature depolarization: Secondary | ICD-10-CM

## 2018-06-18 NOTE — Patient Instructions (Signed)
Your physician wants you to follow-up in: 6 months with Dr. Hilty. You will receive a reminder letter in the mail two months in advance. If you don't receive a letter, please call our office to schedule the follow-up appointment.    

## 2018-06-18 NOTE — Progress Notes (Signed)
Cardiology Office Note   Date:  06/18/2018   ID:  Robert Rowe, DOB [birth year], MRN [id number]  PCP:  [name], Rowe   CC: No complaints other than occasional memory loss   History of Present Illness: Robert Rowe is a 84 y.o. male who presents for scheduled follow-up office visit.  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 He has had numerous endoscopies and esophageal dilatations in the past. He does have a history of chest pain often triggered by alcohol ingestion or overeating. He had chest pain at [facility] in 2014. Stress test there was negative. Last seen by Dr. [name] 8/16.  Admitted 11/15/15 with chest pain. He had minimal relief with nitroglycerin at home. Cardiac enzymes remained negative. Telemetry was unremarkable. PVCs were noted.   He was seen 12/16 for volume excess. He was placed on Lasix. Echo demonstrated normal LVF, mild diastolic dysfunction, mild to mod pulmonary HTN.   He has been worried about his renal function.  He has tried to stay off of meloxicam as much as possible and he is also trying to cut back on his Protonix.  He has furosemide 40 mg on hand but does not take it every day.  He skips it if he is traveling.  The patient had blood work on 01/14/16 which showed that his renal function is stable with creatinine 1.6 to and his B natruretic peptide was normal at 36.5.  He has had mild bilateral peripheral edema which we have suspected is secondary to venous insufficiency rather than to ongoing congestive heart failure..  05/04/2016  I the pleasure of meeting [name] today in the office. 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 He has no known cardiac history but does have a history of high  cholesterol and possible diastolic dysfunction. He takes Lasix as needed for shortness of breath and swelling. He tends to use it as needed.  11/01/2016  [name] returns today for follow-up. He reports that his main concern is dyspnea. He says this is been present for months but recently is he comes somewhat worse. Workup earlier this year indicated stable systolic function with a low BNP of 36.5. He has chronic venous insufficiency which is likely the cause of his lower extremity swelling. He denies any orthopnea or PND. He does demonstrate some pursed lip breathing today in the office. He denies any wheezing, cough or chest pain. Weight is up about 4 pounds since his last office visit that he reports that he thinks she's been "eating too well.  06/13/2017  [name] returns today for follow-up. He is again reporting problems with shortness of breath. When I last saw him we have recommended an increase in his Lasix however he declined that. We also ordered pulmonary function tests which he did get. This demonstrated a severe diffusion defect. We did contact him with those results of that was possibly provided BMI chart. He says that he never received results, or also says that he felt that the results were normal. This is not the case, in fact we had referred him to pulmonary for evaluation however he refused the referral because he felt that the testing was normal. I explained to him again  today that there was a significant diffusion defect. He reports shortness of breath with exertion. In the office we walked him around to get on the ambulatory oxygen saturation. His oxygen saturation dropped to 86% with ambulation became up to 96% on 2 L oxygen.  12/19/2017  [name] was seen today in follow-up.  He again has complaints of shortness of breath today.  He has some memory issues, but in general is doing fairly well for his age.  He recently had some problems with dizziness which resolved.  He had  meclizine to use if needed and a CT scan of the brain showed some atrophy and small vessel disease.  His wife accompanied with him today and they have been married for 44 years.  She says he has become increasingly difficult to care for and almost sounded like she thought he should be in a skilled facility however when questioned did not want him to do that.  I also question about whether they would benefit from some home health care and she said "he would never let somebody else in the house".  When asked about his shortness of breath he says is fairly stable and generally tolerable since he "does not do much".  Vitals appear normal today.  Weight is stable.  His appetite is still reasonably good.  06/18/2018  [name] was seen today in follow-up.  Overall he seems to be doing well, especially given his advanced age.  Is not complaining of shortness of breath is much as he had recently.  He was admitted in June with some right upper quadrant abdominal pain, thought initially to be a cardiac chest pain however felt to be atypical.  He says he has had no further chest pain episodes since then.  He is able to get around and do most normal activities.  He generally eats out a few times a week which is what he enjoys the most.  He occasionally gets some ankle edema which is more likely related to venous insufficiency.  Past Medical History:  Diagnosis Date  . BENIGN PROSTATIC HYPERTROPHY 04/05/2007  . 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 CHEST DISCOMFORT 04/25/2009  . Chronic diastolic CHF (congestive heart failure) ([place])    a. Echo 12/16: mild LVH, EF 55-60%, no RWMA, Gr 1 DD, mild AI, MAC, mild LAE, normal RVF, PASP 37 mmHg  . Chronic kidney disease    chronic   . DJD (degenerative joint disease)   . Dysrhythmia    pvc's   . GERD 12/03/2007  . Hiatal hernia   . History of nuclear stress test    Myoview 1/17: EF 53%, normal perfusion; Low Risk  . HYPERLIPIDEMIA 04/05/2007  .  Hypothyroidism   . Osteoarth NOS-Unspec 04/05/2007  . Palpitations 02/12/2010  . PEDAL EDEMA 05/01/2008  . Rheumatoid arthritis(714.0) 04/05/2007  . Shortness of breath dyspnea   . TRANSIENT ISCHEMIC ATTACK, HX OF 04/05/2007   ?     Past Surgical History:  Procedure Laterality Date  . BALLOON DILATION N/A 03/12/2013   Procedure: BALLOON DILATION;  Surgeon: Inda Castle, Rowe;  Location: [facility];  Service: Endoscopy;  Laterality: N/A;  . CATARACT EXTRACTION  2001   bilateral  . CYSTOSCOPY WITH URETHRAL DILATATION N/A 09/24/2015   Procedure: CYSTOSCOPY WITH COOK BALLOON DILATATION OF URETHRAL STRICTURE ;  Surgeon: Carolan Clines, Rowe;  Location: [facility];  Service: Urology;  Laterality: N/A;  . CYSTOSCOPY WITH URETHRAL DILATATION N/A 07/20/2016  Procedure: CYSTOSCOPY WITH URETHRAL DILATATION;  Surgeon: Alexis Frock, Rowe;  Location: [facility];  Service: Urology;  Laterality: N/A;  1 HOUR  . ESOPHAGOGASTRODUODENOSCOPY N/A 03/12/2013   Procedure: ESOPHAGOGASTRODUODENOSCOPY (EGD);  Surgeon: Inda Castle, Rowe;  Location: [facility];  Service: Endoscopy;  Laterality: N/A;  . HEMORRHOID SURGERY    . HERNIA REPAIR     inguinal x5  . KNEE SURGERY  arthroscopic   left  . PROSTATE CRYOABLATION     2000  . SHOULDER SURGERY  few years ago   left  . TRANSURETHRAL RESECTION OF PROSTATE N/A 03/19/2015   Procedure: TRANSURETHRAL RESECTION OF THE PROSTATE (TURP);  Surgeon: Carolan Clines, Rowe;  Location: [facility];  Service: Urology;  Laterality: N/A;     Current Outpatient Medications  Medication Sig Dispense Refill  . acetaminophen (TYLENOL) 325 MG tablet Take 650 mg by mouth every morning.    . clopidogrel (PLAVIX) 75 MG tablet Take 1 tablet (75 mg total) by mouth daily. 90 tablet 1  . furosemide (LASIX) 20 MG tablet Take 1 tablet (20 mg total) by mouth daily as needed for fluid or edema. 90 tablet 0  . latanoprost (XALATAN) 0.005 % ophthalmic solution Instill 1 drop into both eyes at bedtime  50/2=25  99  . levothyroxine (SYNTHROID, LEVOTHROID) 50 MCG tablet TAKE 1 TABLET (50 MCG TOTAL) BY MOUTH DAILY. 90 tablet 2  . metoprolol succinate (TOPROL-XL) 25 MG 24 hr tablet Take 1 tablet (25 mg total) by mouth at bedtime. 90 tablet 1  . Multiple Vitamin (MULTIVITAMIN WITH MINERALS) TABS tablet Take 1 tablet by mouth 3 (three) times a week.    . nitroGLYCERIN (NITROSTAT) 0.4 MG SL tablet Place 0.4 mg under the tongue every 5 (five) minutes as needed (ESOPHEAGEAL SPASPMS). Reported on 03/01/2016    . pantoprazole (PROTONIX) 40 MG tablet Take 1 tablet (40 mg total) by mouth daily. 90 tablet 1  . polyethylene glycol powder (GLYCOLAX/MIRALAX) powder Take 17 g by mouth 2 (two) times daily as needed. 3350 g 1   No current facility-administered medications for this visit.     Allergies:   Sulfonamide derivatives    Social History:  The patient  reports that he quit smoking about 64 years ago. His smoking use included cigarettes. He has never used smokeless tobacco. He reports that he drinks alcohol. He reports that he does not use drugs.   Family History:  The patient's family history includes Breast cancer in his mother; Heart attack in his father; Heart disease in his father; Lymphoma in his brother.    ROS:   Pertinent items noted in HPI and remainder of comprehensive ROS otherwise negative.  PHYSICAL EXAM: VS:  BP 127/83   Pulse 84   Ht _0  (1.575 m)   Wt 149 lb 3.2 oz (67.7 kg)   BMI 27.29 kg/m  , BMI Body mass index is 27.29 kg/m. General appearance: alert and no distress Neck: no carotid bruit, no JVD and thyroid not enlarged, symmetric, no tenderness/mass/nodules Lungs: diminished breath sounds bilaterally Heart: regular rate and rhythm Abdomen: soft, non-tender; bowel sounds normal; no masses,  no organomegaly Extremities: edema trace bilateral ankle edema Pulses: 2+ and symmetric Skin: Skin color, texture, turgor normal. No rashes or lesions Neurologic: Grossly  normal Psych: Pleasant  EKG:   Deferred  Recent Labs: 07/25/2017: TSH 2.84 05/15/2018: ALT 43; BUN 18; Creatinine, Ser 1.70; Hemoglobin 14.2; Platelets 217; Potassium 4.4; Sodium 138    Lipid Panel    Component  Value Date/Time   CHOL 255 (H) 03/24/2016 1328   TRIG 327.0 (H) 03/24/2016 1328   HDL 37.90 (L) 03/24/2016 1328   CHOLHDL 7 03/24/2016 1328   VLDL 65.4 (H) 03/24/2016 1328   LDLCALC 116 (H) 11/15/2015 0421   LDLDIRECT 175.0 03/24/2016 1328      Wt Readings from Last 3 Encounters:  06/18/18 149 lb 3.2 oz (67.7 kg)  05/21/18 146 lb (66.2 kg)  05/15/18 149 lb 12.8 oz (67.9 kg)      ASSESSMENT: 1. Dyspnea - severe diffusion defect on PFT's, refused oxygen 2. Hypertension 3. Chronic diastolic heart failure 4. CKD 3 5. 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 He was seen in the hospital for atypical chest pain, ruled-out and discharged in June. He reports his breathing is stable. Weight is up and down, but generally stable. No significant edema. No medicine changes today.  Follow-up in 6 months.  Pixie Casino, Rowe, [facility], [place] Director of the Advanced Lipid Disorders &  Cardiovascular Risk Reduction Clinic Diplomate of the American Board of Clinical Lipidology Attending Cardiologist  Direct Dial: [phone]  Fax: [phone]  Website:  [email or web address]   06/18/2018 1:37 PM

## 2018-07-02 ENCOUNTER — Other Ambulatory Visit: Payer: Self-pay | Admitting: Internal Medicine

## 2018-08-08 ENCOUNTER — Ambulatory Visit: Payer: Medicare Other | Admitting: Family Medicine

## 2018-08-10 ENCOUNTER — Ambulatory Visit (INDEPENDENT_AMBULATORY_CARE_PROVIDER_SITE_OTHER): Payer: Medicare Other | Admitting: Family Medicine

## 2018-08-10 ENCOUNTER — Other Ambulatory Visit: Payer: Self-pay

## 2018-08-10 ENCOUNTER — Encounter: Payer: Self-pay | Admitting: Family Medicine

## 2018-08-10 VITALS — BP 128/68 | HR 73 | Ht 62.0 in | Wt 149.9 lb

## 2018-08-10 DIAGNOSIS — I10 Essential (primary) hypertension: Secondary | ICD-10-CM | POA: Diagnosis not present

## 2018-08-10 DIAGNOSIS — Z23 Encounter for immunization: Secondary | ICD-10-CM

## 2018-08-10 DIAGNOSIS — E039 Hypothyroidism, unspecified: Secondary | ICD-10-CM | POA: Diagnosis not present

## 2018-08-10 DIAGNOSIS — N183 Chronic kidney disease, stage 3 unspecified: Secondary | ICD-10-CM

## 2018-08-10 DIAGNOSIS — K219 Gastro-esophageal reflux disease without esophagitis: Secondary | ICD-10-CM

## 2018-08-10 DIAGNOSIS — I5032 Chronic diastolic (congestive) heart failure: Secondary | ICD-10-CM

## 2018-08-10 LAB — BASIC METABOLIC PANEL
BUN: 21 mg/dL (ref 6–23)
CO2: 28 mEq/L (ref 19–32)
Calcium: 9.3 mg/dL (ref 8.4–10.5)
Chloride: 102 mEq/L (ref 96–112)
Creatinine, Ser: 1.46 mg/dL (ref 0.40–1.50)
GFR: 47.39 mL/min — ABNORMAL LOW (ref >60.00)
Glucose, Bld: 92 mg/dL (ref 70–99)
Potassium: 4.5 mEq/L (ref 3.5–5.1)
Sodium: 139 mEq/L (ref 135–145)

## 2018-08-10 LAB — TSH: TSH: 2.67 u[IU]/mL (ref 0.35–4.50)

## 2018-08-10 NOTE — Progress Notes (Signed)
Subjective:     Patient ID: Robert Rowe, male   DOB: 04/11/1918, 84 y.o.   MRN: [id number]  HPI Patient is here for routine medical follow-up. His chronic problems include history of hypertension, chronic diastolic heart failure, hypothyroidism, osteoarthritis, chronic kidney disease stage III, GERD, dyslipidemia, cognitive impairment, history of cerebrovascular disease.  And denies any falls since last visit. He has no specific complaints. His peripheral edema has been stable. He infrequently takes low-dose Lasix for increased edema  Hypothyroidism on replacement. He is due for follow-up labs for thyroid. Needs flu vaccine. GERD symptoms controlled with Protonix. Still has occasional breakthrough with things like chocolate.  No dysphagia.  No appetite or weight changes. Medications reviewed   Compliant with all.    Past Medical History:  Diagnosis Date  . BENIGN PROSTATIC HYPERTROPHY 04/05/2007  . 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 CHEST DISCOMFORT 04/25/2009  . Chronic diastolic CHF (congestive heart failure) ([place])    a. Echo 12/16: mild LVH, EF 55-60%, no RWMA, Gr 1 DD, mild AI, MAC, mild LAE, normal RVF, PASP 37 mmHg  . Chronic kidney disease    chronic   . DJD (degenerative joint disease)   . Dysrhythmia    pvc's   . GERD 12/03/2007  . Hiatal hernia   . History of nuclear stress test    Myoview 1/17: EF 53%, normal perfusion; Low Risk  . HYPERLIPIDEMIA 04/05/2007  . Hypothyroidism   . Osteoarth NOS-Unspec 04/05/2007  . Palpitations 02/12/2010  . PEDAL EDEMA 05/01/2008  . Rheumatoid arthritis(714.0) 04/05/2007  . Shortness of breath dyspnea   . TRANSIENT ISCHEMIC ATTACK, HX OF 04/05/2007   ?    Past Surgical History:  Procedure Laterality Date  . BALLOON DILATION N/A 03/12/2013   Procedure: BALLOON DILATION;  Surgeon: Inda Castle, MD;  Location: [facility];  Service: Endoscopy;  Laterality: N/A;  . CATARACT EXTRACTION  2001   bilateral  .  CYSTOSCOPY WITH URETHRAL DILATATION N/A 09/24/2015   Procedure: CYSTOSCOPY WITH COOK BALLOON DILATATION OF URETHRAL STRICTURE ;  Surgeon: Carolan Clines, MD;  Location: [facility];  Service: Urology;  Laterality: N/A;  . CYSTOSCOPY WITH URETHRAL DILATATION N/A 07/20/2016   Procedure: CYSTOSCOPY WITH URETHRAL DILATATION;  Surgeon: Alexis Frock, MD;  Location: [facility];  Service: Urology;  Laterality: N/A;  1 HOUR  . ESOPHAGOGASTRODUODENOSCOPY N/A 03/12/2013   Procedure: ESOPHAGOGASTRODUODENOSCOPY (EGD);  Surgeon: Inda Castle, MD;  Location: [facility];  Service: Endoscopy;  Laterality: N/A;  . HEMORRHOID SURGERY    . HERNIA REPAIR     inguinal x5  . KNEE SURGERY  arthroscopic   left  . PROSTATE CRYOABLATION     2000  . SHOULDER SURGERY  few years ago   left  . TRANSURETHRAL RESECTION OF PROSTATE N/A 03/19/2015   Procedure: TRANSURETHRAL RESECTION OF THE PROSTATE (TURP);  Surgeon: Carolan Clines, MD;  Location: [facility];  Service: Urology;  Laterality: N/A;    reports that he quit smoking about 65 years ago. His smoking use included cigarettes. He has never used smokeless tobacco. He reports that he drinks alcohol. He reports that he does not use drugs. family history includes Breast cancer in his mother; Heart attack in his father; Heart disease in his father; Lymphoma in his brother. Allergies  Allergen Reactions  . Sulfasalazine     Patient does not remember allergy symptoms  . Sulfonamide Derivatives Other (See Comments)    Patient does not remember allergy symptoms  Review of Systems  Constitutional: Negative for appetite change, fatigue and unexpected weight change.  HENT: Negative for trouble swallowing.   Eyes: Negative for visual disturbance.  Respiratory: Negative for cough, chest tightness and shortness of breath.   Cardiovascular: Negative for chest pain, palpitations and leg swelling.  Genitourinary: Negative for dysuria.  Neurological: Negative for dizziness,  syncope, weakness, light-headedness and headaches.       Objective:   Physical Exam  Constitutional: He appears well-developed and well-nourished.  HENT:  Mouth/Throat: Oropharynx is clear and moist.  Cardiovascular: Normal rate and regular rhythm.  Pulmonary/Chest: Effort normal and breath sounds normal.  Musculoskeletal: He exhibits no edema.  Neurological: He is alert.       Assessment:     #1 history of diastolic heart failure stable symptomatically at this time  #2 hypothyroidism  #3 cognitive impairment  #4 GERD controlled with Protonix    Plan:     -flu vaccine given -Check basic metabolic panel and TSH -Routine follow-up in 3 months and sooner as needed  [name] MD [facility]

## 2018-08-16 ENCOUNTER — Telehealth: Payer: Self-pay | Admitting: Neurology

## 2018-08-16 DIAGNOSIS — G5602 Carpal tunnel syndrome, left upper limb: Secondary | ICD-10-CM

## 2018-08-16 NOTE — Telephone Encounter (Signed)
Orders placed for EMG of LUE

## 2018-08-16 NOTE — Telephone Encounter (Signed)
Patient called  regarding his upcoming treatment for his fingers? Please Call. Thanks

## 2018-08-16 NOTE — Telephone Encounter (Signed)
Spoke with pt.  He states that he is ready for EMG. Call forwarded to front desk to schedule.

## 2018-09-04 ENCOUNTER — Ambulatory Visit (INDEPENDENT_AMBULATORY_CARE_PROVIDER_SITE_OTHER): Payer: Medicare Other | Admitting: Neurology

## 2018-09-04 DIAGNOSIS — G5602 Carpal tunnel syndrome, left upper limb: Secondary | ICD-10-CM

## 2018-09-04 NOTE — Procedures (Signed)
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  NCV & EMG Findings: Extensive electrodiagnostic testing of the left upper extremity shows:  1. Left median sensory response is absent.  Left ulnar sensory responses within normal limits. 2. Left median motor response shows severely prolonged latency (6.6 ms) and reduced amplitude (2.0 mV).  Left ulnar motor responses within normal limits.   3. Chronic motor axonal loss changes are isolated to the left abductor pollicis brevis muscle, with no active denervation.  The remaining tested muscle showed normal motor unit configuration and recruitment pattern.    Impression: Left median neuropathy at or distal to the wrist, consistent with the clinical diagnosis of carpal tunnel syndrome.  Overall, these findings are very severe in degree electrically.   ___________________________ Narda Amber, DO    Nerve Conduction Studies Anti Sensory Summary Table   Site NR Peak (ms) Norm Peak (ms) P-T Amp (V) Norm P-T Amp  Left Median Anti Sensory (2nd Digit)  33.4C  Wrist NR  <3.8  >10  Left Ulnar Anti Sensory (5th Digit)  33.4C  Wrist    2.9 <3.2 7.1 >5   Motor Summary Table   Site NR Onset (ms) Norm Onset (ms) O-P Amp (mV) Norm O-P Amp Site1 Site2 Delta-0 (ms) Dist (cm) Vel (m/s) Norm Vel (m/s)  Left Median Motor (Abd Poll Brev)  33.4C  Wrist    6.6 <4.0 2.0 >5 Elbow Wrist 5.0 25.0 50 >50  Elbow    11.6  2.0         Left Ulnar Motor (Abd Dig Minimi)  33.4C  Wrist    2.8 <3.1 12.7 >7 B Elbow Wrist 4.0 21.0 53 >50  B Elbow    6.8   11.0  A Elbow B Elbow 2.0 10.0 50 >50  A Elbow    8.8  10.2          EMG   Side Muscle Ins Act Fibs Psw Fasc Number Recrt Dur Dur. Amp Amp. Poly Poly. Comment  Left 1stDorInt Nml Nml Nml Nml Nml Nml Nml Nml Nml Nml Nml Nml N/A  Left Abd Poll Brev Nml Nml Nml Nml SMU Rapid All 1+ All 1+ All 1+ ATR  Left PronatorTeres Nml Nml Nml Nml Nml Nml Nml Nml Nml Nml Nml Nml N/A  Left Biceps Nml Nml Nml Nml Nml Nml Nml Nml Nml Nml Nml Nml N/A  Left Triceps Nml Nml Nml Nml Nml Nml Nml Nml Nml Nml Nml Nml N/A      Waveforms:

## 2018-10-04 ENCOUNTER — Other Ambulatory Visit: Payer: Self-pay | Admitting: Family Medicine

## 2018-10-22 DIAGNOSIS — G5602 Carpal tunnel syndrome, left upper limb: Secondary | ICD-10-CM | POA: Diagnosis not present

## 2018-10-25 DIAGNOSIS — Z961 Presence of intraocular lens: Secondary | ICD-10-CM | POA: Diagnosis not present

## 2018-10-25 DIAGNOSIS — H401133 Primary open-angle glaucoma, bilateral, severe stage: Secondary | ICD-10-CM | POA: Diagnosis not present

## 2018-10-29 ENCOUNTER — Ambulatory Visit: Payer: Self-pay

## 2018-10-29 NOTE — Telephone Encounter (Signed)
Pt. Asking what he can take for "cold symptoms." States he does not have HTN. Instructed with HTN, Coricidin is safe. If no HTN, any cold preparation is good. Verbalizes understanding. Reviewed home remedies.Instructed if no better in 7-10 days to call back. Verbalizes understanding.  Answer Assessment - Initial Assessment Questions 1. SYMPTOMS: "Do you have any symptoms?"     Asking what he can take for a cold. 2. SEVERITY: If symptoms are present, ask "Are they mild, moderate or severe?"     Mild  Protocols used: MEDICATION QUESTION CALL-A-AH

## 2018-10-30 ENCOUNTER — Ambulatory Visit (INDEPENDENT_AMBULATORY_CARE_PROVIDER_SITE_OTHER): Payer: Medicare Other

## 2018-10-30 ENCOUNTER — Ambulatory Visit (INDEPENDENT_AMBULATORY_CARE_PROVIDER_SITE_OTHER): Payer: Medicare Other | Admitting: Internal Medicine

## 2018-10-30 ENCOUNTER — Encounter: Payer: Self-pay | Admitting: Internal Medicine

## 2018-10-30 VITALS — BP 118/64 | HR 100 | Temp 97.6°F | Wt 151.6 lb

## 2018-10-30 DIAGNOSIS — R54 Age-related physical debility: Secondary | ICD-10-CM | POA: Diagnosis not present

## 2018-10-30 DIAGNOSIS — R0602 Shortness of breath: Secondary | ICD-10-CM | POA: Diagnosis not present

## 2018-10-30 DIAGNOSIS — R05 Cough: Secondary | ICD-10-CM | POA: Diagnosis not present

## 2018-10-30 DIAGNOSIS — R058 Other specified cough: Secondary | ICD-10-CM

## 2018-10-30 MED ORDER — HYDROCODONE-HOMATROPINE 5-1.5 MG/5ML PO SYRP
2.5000 mL | ORAL_SOLUTION | Freq: Three times a day (TID) | ORAL | 0 refills | Status: DC | PRN
Start: 2018-10-30 — End: 2018-12-04

## 2018-10-30 NOTE — Patient Instructions (Addendum)
I dont see    pneumonia on your exam or x ray . can try cough med .  With caution . Stay hydrated .  Seem to be a viral  Respiratory infection  If fever worsening contact for reevaluation .

## 2018-10-30 NOTE — Progress Notes (Signed)
Chief Complaint  Patient presents with  . Cough    Poss PNA? CXR done today STAT. Cough x 1 day, kept him up all night long. Cough is deep with some mucous production but he swallows it. Denies CP, Chest tx and fever. Increased SOB    HPI: Robert Rowe 84 y.o. come in for sda   pcp na retired   hsa cough anc concern about  Developing pna per son   He is generally well without serious lung disease.  He does have some renal insufficiency but no current active heart failure according to patient. There is no fever significant achiness but has felt short of breath although on questioning he says at baseline when he is not coughing is stable however he has had increasing coughing it is very deep and concerning.  No wheezing. No unusual swelling weight gain weight loss vomiting diarrhea slightly runny nose. He is here with his son today on an STA work in visit.  Wants to make sure he has no pneumonia or serious cause of his symptoms. Patient states that he generally well. ROS: See pertinent positives and negatives per HPI.  Past Medical History:  Diagnosis Date  . BENIGN PROSTATIC HYPERTROPHY 04/05/2007  . 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 CHEST DISCOMFORT 04/25/2009  . Chronic diastolic CHF (congestive heart failure) ([place])    a. Echo 12/16: mild LVH, EF 55-60%, no RWMA, Gr 1 DD, mild AI, MAC, mild LAE, normal RVF, PASP 37 mmHg  . Chronic kidney disease    chronic   . DJD (degenerative joint disease)   . Dysrhythmia    pvc's   . GERD 12/03/2007  . Hiatal hernia   . History of nuclear stress test    Myoview 1/17: EF 53%, normal perfusion; Low Risk  . HYPERLIPIDEMIA 04/05/2007  . Hypothyroidism   . Osteoarth NOS-Unspec 04/05/2007  . Palpitations 02/12/2010  . PEDAL EDEMA 05/01/2008  . Rheumatoid arthritis(714.0) 04/05/2007  . Shortness of breath dyspnea   . TRANSIENT ISCHEMIC ATTACK, HX OF 04/05/2007   ?     Family History  Problem Relation Age of Onset  .  Breast cancer Mother   . Heart disease Father   . Heart attack Father   . Lymphoma Brother   . Stroke Neg Hx   . Hypertension Neg Hx     Social History   Socioeconomic History  . Marital status: Married    Spouse name: Not on file  . Number of children: Not on file  . Years of education: Not on file  . Highest education level: Not on file  Occupational History  . Not on file  Social Needs  . Financial resource strain: Not on file  . Food insecurity:    Worry: Not on file    Inability: Not on file  . Transportation needs:    Medical: Not on file    Non-medical: Not on file  Tobacco Use  . Smoking status: Former Smoker    Types: Cigarettes    Last attempt to quit: 08/13/1953    Years since quitting: 65.2  . Smokeless tobacco: Never Used  Substance and Sexual Activity  . Alcohol use: Yes    Comment: occ vodka  . Drug use: No  . Sexual activity: Not on file  Lifestyle  . Physical activity:    Days per week: Not on file    Minutes per session: Not on file  . Stress: Not  on file  Relationships  . Social connections:    Talks on phone: Not on file    Gets together: Not on file    Attends religious service: Not on file    Active member of club or organization: Not on file    Attends meetings of clubs or organizations: Not on file    Relationship status: Not on file  Other Topics Concern  . Not on file  Social History Narrative  . Not on file    Outpatient Medications Prior to Visit  Medication Sig Dispense Refill  . acetaminophen (TYLENOL) 325 MG tablet Take 650 mg by mouth every morning.    . clopidogrel (PLAVIX) 75 MG tablet TAKE 1 TABLET BY MOUTH  DAILY 90 tablet 1  . furosemide (LASIX) 20 MG tablet TAKE 1 TABLET BY MOUTH  DAILY AS NEEDED FOR FLUID  OR EDEMA. 90 tablet 3  . latanoprost (XALATAN) 0.005 % ophthalmic solution Instill 1 drop into both eyes at bedtime 50/2=25  99  . levothyroxine (SYNTHROID, LEVOTHROID) 50 MCG tablet TAKE 1 TABLET (50 MCG TOTAL) BY  MOUTH DAILY. 90 tablet 2  . metoprolol succinate (TOPROL-XL) 25 MG 24 hr tablet Take 1 tablet (25 mg total) by mouth at bedtime. 90 tablet 1  . Multiple Vitamin (MULTIVITAMIN WITH MINERALS) TABS tablet Take 1 tablet by mouth 3 (three) times a week.    . neomycin-polymyxin b-dexamethasone (MAXITROL) 3.5-10000-0.1 SUSP Place 2 drops into both eyes 4 (four) times daily.    . nitroGLYCERIN (NITROSTAT) 0.4 MG SL tablet Place 0.4 mg under the tongue every 5 (five) minutes as needed (ESOPHEAGEAL SPASPMS). Reported on 03/01/2016    . pantoprazole (PROTONIX) 40 MG tablet Take 1 tablet (40 mg total) by mouth daily. 90 tablet 1   No facility-administered medications prior to visit.      EXAM:  BP 118/64 (BP Location: Right Arm, Patient Position: Sitting, Cuff Size: Normal)   Pulse 100   Temp 97.6 F (36.4 C) (Oral)   Wt 151 lb 9.6 oz (68.8 kg)   BMI 27.73 kg/m   Body mass index is 27.73 kg/m.  GENERAL: vitals reviewed and listed above, alert, oriented, appears well hydrated and in no acute distress he is interactive in no acute distress normal color nontoxic normal respirations.  Occasional very deep bronchial cough. HEENT: atraumatic, conjunctiva  clear, no obvious abnormalities on inspection of external nose and ears OP : no lesion edema or exudate  NECK: no obvious masses on inspection palpation  LUNGS: clear to auscultation bilaterally, no wheezes, rales or rhonchi, occasional cough but none during lung exam. CV: HRRR, no clubbing cyanosis or  peripheral edema nl cap refill  MS: moves all extremities without noticeable focal  abnormality PSYCH: pleasant and cooperative, no obvious depression or anxiety  BP Readings from Last 3 Encounters:  10/30/18 118/64  08/10/18 128/68  06/18/18 127/83    ASSESSMENT AND PLAN:  Discussed the following assessment and plan:  Respiratory tract congestion with cough - Plan: DG Chest 2 View, DG Chest 2 View  Advanced age - Plan: DG Chest 2 View, DG  Chest 2 View He denies symptoms of heart failure and underlying regular respiratory disease. His exam is reassuring today chest x-ray shows no acute respiratory problem hiatal hernia most likely in his chest that he states he has had for a long time. Unable to get the pulse ox today because of cold hands but his color looks good. We discussed symptomatic treatment has risk benefit  can use low-dose hydrocodone at night so if he can sleep he may feel better aware of the risk patient is retired Engineer, drilling. Close observation advised at this time no indication for antibiotic and follow-up with PCP in a few days by the end of the week of concerns. He is up-to-date on his flu vaccine. Total visit 26mns > 50% spent counseling and coordinating care as indicated in above note and in instructions to patient .   -Patient advised to return or notify health care team  if  new concerns arise.  Patient Instructions  I dont see    pneumonia on your exam or x ray . can try cough med .  With caution . Stay hydrated .  Seem to be a viral  Respiratory infection  If fever worsening contact for reevaluation .        WStandley Brooking Araiyah Cumpton M.D.

## 2018-11-01 ENCOUNTER — Ambulatory Visit: Payer: Self-pay

## 2018-11-01 NOTE — Telephone Encounter (Signed)
Pt son Robert Rowe called to state that last night his father took 2 doses of hycodan cough medication within 3 hours. He states his father did not measure "just gulped" He then took an OTC sleep aid called Sleep Aid 50 mg diphenhydramine. Pt slept through the night and is now up. He has vomited and is extremely sleepy. Son states that his father dreamed all night. He needs help to sip tea and coffee. Per protocol FC Mendel Ryder was notified. Advised to schedule pt appointment today. I was unable to find an appropriate  appointment time today and offered to call office again for help. Pt son instead asked for appointment tomorrow stating that he will call back should these symptoms get worse or will cancel if symptoms subside. Now the patient has returned to bed. Care advice to call 911 or go to ED if symptoms of drowsiness continue. Pt son verbalized understanding of instruction. Appointment schedule for tomorrow at sons request.  Reason for Disposition . Caller has URGENT medication question about med that PCP prescribed and triager unable to answer question  Answer Assessment - Initial Assessment Questions 1. SYMPTOMS: "Do you have any symptoms?"     Sleepy after taking hydrocodone for cough 2 doses and then sleeping pill 2. SEVERITY: If symptoms are present, ask "Are they mild, moderate or severe?"     nausea  Protocols used: MEDICATION QUESTION CALL-A-AH

## 2018-11-02 ENCOUNTER — Ambulatory Visit: Payer: Medicare Other | Admitting: Family Medicine

## 2018-11-02 ENCOUNTER — Ambulatory Visit: Payer: Self-pay | Admitting: *Deleted

## 2018-11-02 ENCOUNTER — Ambulatory Visit: Payer: Self-pay

## 2018-11-02 ENCOUNTER — Ambulatory Visit (INDEPENDENT_AMBULATORY_CARE_PROVIDER_SITE_OTHER): Payer: Medicare Other | Admitting: Family Medicine

## 2018-11-02 ENCOUNTER — Encounter: Payer: Self-pay | Admitting: Family Medicine

## 2018-11-02 VITALS — BP 118/60 | HR 72 | Wt 151.0 lb

## 2018-11-02 DIAGNOSIS — J209 Acute bronchitis, unspecified: Secondary | ICD-10-CM

## 2018-11-02 MED ORDER — ALBUTEROL SULFATE HFA 108 (90 BASE) MCG/ACT IN AERS: 2.0000 | INHALATION_SPRAY | RESPIRATORY_TRACT | 0 refills | Status: DC | PRN

## 2018-11-02 MED ORDER — AMOXICILLIN-POT CLAVULANATE 875-125 MG PO TABS: 1.0000 | ORAL_TABLET | Freq: Two times a day (BID) | ORAL | 0 refills | Status: DC

## 2018-11-02 NOTE — Telephone Encounter (Signed)
Dr. Sarajane Jews has been made aware.

## 2018-11-02 NOTE — Telephone Encounter (Signed)
Pt's son, Robert Rowe, called stating that his father is waking up more; he would like to cancel his appointment due to the pt's getting better and improving is oral intake, and due to the weather; he is most upset that the pt's allergy to codeine was not listed, and that the pt was unable to be seen in the office yesterday; this pt is normally seen by Dr Elease Hashimoto but was last seen by Dr Regis Bill, [facility], 10/30/18; will route to office for notification; notified Sheena.       Reason for Disposition . Caller has NON-URGENT medication question about med that PCP prescribed and triager unable to answer question  Answer Assessment - Initial Assessment Questions 1. SYMPTOMS: "Do you have any symptoms?"     vomiting 2. SEVERITY: If symptoms are present, ask "Are they mild, moderate or severe?"     See triage note 10/31/18; pt's son says the pt is doing better  Protocols used: MEDICATION QUESTION CALL-A-AH

## 2018-11-02 NOTE — Telephone Encounter (Signed)
Please see messages. Patient has an appointment with Dr. Sarajane Jews today.

## 2018-11-02 NOTE — Telephone Encounter (Signed)
Caller name: Denzel Etienne Relation to pt: son  Call back number:[phone]:   Reason for call:  [place] vomitting ligheaded  ** The message may be incomplete. It was sent as a result of a timeout. **  Patient has a rough night- patient had bad reaction to pain medication- nausea and vomiting. Patient is allergic to codeine and he took pain medication. Patient is doing better today- son is reporting he is hydrating and more lucid today.

## 2018-11-02 NOTE — Telephone Encounter (Signed)
  Reason for Disposition . [1] MODERATE difficulty breathing (e.g., speaks in phrases, SOB even at rest, pulse 100-120) AND [2] NEW-onset or WORSE than normal  Answer Assessment - Initial Assessment Questions 1. RESPIRATORY STATUS: "Describe your breathing?" (e.g., wheezing, shortness of breath, unable to speak, severe coughing)      Shortness of breath, wheezing 2. ONSET: "When did this breathing problem begin?"      2 days ago 3. PATTERN "Does the difficult breathing come and go, or has it been constant since it started?"      Constant 4. SEVERITY: "How bad is your breathing?" (e.g., mild, moderate, severe)    - MILD: No SOB at rest, mild SOB with walking, speaks normally in sentences, can lay down, no retractions, pulse < 100.    - MODERATE: SOB at rest, SOB with minimal exertion and prefers to sit, cannot lie down flat, speaks in phrases, mild retractions, audible wheezing, pulse 100-120.    - SEVERE: Very SOB at rest, speaks in single words, struggling to breathe, sitting hunched forward, retractions, pulse > 120      Severe 5. RECURRENT SYMPTOM: "Have you had difficulty breathing before?" If so, ask: "When was the last time?" and "What happened that time?"      No 6. CARDIAC HISTORY: "Do you have any history of heart disease?" (e.g., heart attack, angina, bypass surgery, angioplasty)      No 7. LUNG HISTORY: "Do you have any history of lung disease?"  (e.g., pulmonary embolus, asthma, emphysema)     No 8. CAUSE: "What do you think is causing the breathing problem?"      I don't know 9. OTHER SYMPTOMS: "Do you have any other symptoms? (e.g., dizziness, runny nose, cough, chest pain, fever)     Runny nose, dizziness, cough a little 10. PREGNANCY: "Is there any chance you are pregnant?" "When was your last menstrual period?"       N/A 11. TRAVEL: "Have you traveled out of the country in the last month?" (e.g., travel history, exposures)       No  Protocols used: BREATHING  DIFFICULTY-A-AH

## 2018-11-02 NOTE — Telephone Encounter (Signed)
Pt was seen  today by Dr Fry 

## 2018-11-02 NOTE — Progress Notes (Signed)
   Subjective:    Patient ID: Robert Rowe, male    DOB: 11-Mar-1918, 84 y.o.   MRN: [id number]  HPI Here with his son for one week of chest congestion and coughing. No chest pain or fever. The sputum had been clear but it has now turned brown. He was seen here on 10-30-18 and it was felt he had a viral URI. A CXR that day was unremarkable. Over the past 2 days he has felt worse and he has become more SOB.    Review of Systems  Constitutional: Negative.   HENT: Negative.   Eyes: Negative.   Respiratory: Positive for cough, chest tightness and shortness of breath. Negative for wheezing.   Cardiovascular: Negative.   Gastrointestinal: Negative.        Objective:   Physical Exam Constitutional:      Appearance: Normal appearance. He is well-developed.     Comments: Walks with a walker. On arrival he was very SOB and his RA oxygen sat was 89%. After treatment with a nebulizer this went up to 94%.   HENT:     Right Ear: Tympanic membrane and ear canal normal.     Left Ear: Tympanic membrane and ear canal normal.     Nose: Nose normal.     Mouth/Throat:     Mouth: Mucous membranes are moist.     Pharynx: Oropharynx is clear.  Eyes:     Conjunctiva/sclera: Conjunctivae normal.  Neck:     Musculoskeletal: Neck supple.  Cardiovascular:     Rate and Rhythm: Normal rate and regular rhythm.     Pulses: Normal pulses.     Heart sounds: Normal heart sounds.  Pulmonary:     Effort: Pulmonary effort is normal. No respiratory distress.     Breath sounds: No wheezing or rales.     Comments: Scattered rhonchi, some of which clear with coughing  Lymphadenopathy:     Cervical: No cervical adenopathy.  Neurological:     Mental Status: He is alert.           Assessment & Plan:  Bronchitis, he was treated with a Duoneb nebulization. We will start him on Augmentin for 10 days. Given an albuterol inhaler. Add Mucinex DM.  Alysia Penna, MD

## 2018-11-02 NOTE — Telephone Encounter (Signed)
Patient called and says he is SOB. I triaged the symptoms and advised him to go to the ED. His son got on the phone and says "he is not going to the ED because he will sit there for 4 hours and catch everything that other people have, he is 84 years old and your office gave him a medicine that he's allergic to. You didn't have an appointment yesterday and all he needs is an order for Oxygen and we will go pick it up." I asked him to hold while I call to the office. I called and spoke to Robert Rowe, Robert Rowe who says that the patient can come in today at 1530 to see Robert Rowe and she will schedule, if it's ok with the patient and his son. I advised the patient's son of the appointment and he says "we will be there. Thank you for everything." He hung up the phone before any care advice given. I advised Robert Rowe that he will be there at 1530.  Answer Assessment - Initial Assessment Questions 1. RESPIRATORY STATUS: "Describe your breathing?" (e.g., wheezing, shortness of breath, unable to speak, severe coughing)      Shortness of breath, wheezing 2. ONSET: "When did this breathing problem begin?"      2 days ago 3. PATTERN "Does the difficult breathing come and go, or has it been constant since it started?"      Constant 4. SEVERITY: "How bad is your breathing?" (e.g., mild, moderate, severe)    - MILD: No SOB at rest, mild SOB with walking, speaks normally in sentences, can lay down, no retractions, pulse < 100.    - MODERATE: SOB at rest, SOB with minimal exertion and prefers to sit, cannot lie down flat, speaks in phrases, mild retractions, audible wheezing, pulse 100-120.    - SEVERE: Very SOB at rest, speaks in single words, struggling to breathe, sitting hunched forward, retractions, pulse > 120      Severe 5. RECURRENT SYMPTOM: "Have you had difficulty breathing before?" If so, ask: "When was the last time?" and "What happened that time?"      No 6. CARDIAC HISTORY: "Do you have any history of heart disease?"  (e.g., heart attack, angina, bypass surgery, angioplasty)      No 7. LUNG HISTORY: "Do you have any history of lung disease?"  (e.g., pulmonary embolus, asthma, emphysema)     No 8. CAUSE: "What do you think is causing the breathing problem?"      I don't know 9. OTHER SYMPTOMS: "Do you have any other symptoms? (e.g., dizziness, runny nose, cough, chest pain, fever)     Runny nose, dizziness, cough a little 10. PREGNANCY: "Is there any chance you are pregnant?" "When was your last menstrual period?"       N/A 11. TRAVEL: "Have you traveled out of the country in the last month?" (e.g., travel history, exposures)       No  Protocols used: BREATHING DIFFICULTY-A-AH

## 2018-11-02 NOTE — Telephone Encounter (Signed)
Please advise 

## 2018-11-02 NOTE — Telephone Encounter (Signed)
Per chart, pt was triaged yesterday after taking Hycodan not as directed and also taking a sleep aid in addition to cough medication. There has been no codeine allergy ever listed in pt's chart, there is no mention of codeine allergy. Pharmacy must not have allergy listed either or they would not have given medication to pt.  Triage nurse yesterday offered to contact office for assistance in trying to get pt scheduled but pt's son declined and asked for appt for today instead.

## 2018-11-02 NOTE — Telephone Encounter (Signed)
noted 

## 2018-11-06 ENCOUNTER — Other Ambulatory Visit: Payer: Self-pay | Admitting: Family Medicine

## 2018-11-06 DIAGNOSIS — G5602 Carpal tunnel syndrome, left upper limb: Secondary | ICD-10-CM | POA: Diagnosis not present

## 2018-11-28 DIAGNOSIS — G5602 Carpal tunnel syndrome, left upper limb: Secondary | ICD-10-CM | POA: Diagnosis not present

## 2018-11-28 HISTORY — PX: CARPAL TUNNEL RELEASE: SHX101

## 2018-11-29 ENCOUNTER — Other Ambulatory Visit: Payer: Self-pay | Admitting: Family Medicine

## 2018-12-04 ENCOUNTER — Encounter: Payer: Self-pay | Admitting: Family Medicine

## 2018-12-04 ENCOUNTER — Other Ambulatory Visit: Payer: Self-pay

## 2018-12-04 ENCOUNTER — Ambulatory Visit (INDEPENDENT_AMBULATORY_CARE_PROVIDER_SITE_OTHER): Payer: Medicare Other | Admitting: Family Medicine

## 2018-12-04 VITALS — BP 112/68 | HR 71 | Ht 62.0 in | Wt 147.4 lb

## 2018-12-04 DIAGNOSIS — H6121 Impacted cerumen, right ear: Secondary | ICD-10-CM

## 2018-12-04 DIAGNOSIS — H9121 Sudden idiopathic hearing loss, right ear: Secondary | ICD-10-CM | POA: Diagnosis not present

## 2018-12-04 MED ORDER — PREDNISONE 10 MG PO TABS: ORAL_TABLET | ORAL | 0 refills | Status: DC

## 2018-12-04 NOTE — Progress Notes (Signed)
Subjective:     Patient ID: Robert Rowe, male   DOB: Jun 20, 1918, 85 y.o.   MRN: [id number]  HPI Patient is seen with acute hearing loss right ear.  Woke up basically yesterday with decreased hearing.  He thinks this may be related to cerumen.  No ear drainage.  No vertigo.  No left ear symptoms.  Denies any history of cerumen impactions in the past..  Denies any ataxia, speech change, facial weakness, focal weakness.   Past Medical History:  Diagnosis Date  . BENIGN PROSTATIC HYPERTROPHY 04/05/2007  . 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 CHEST DISCOMFORT 04/25/2009  . Chronic diastolic CHF (congestive heart failure) ([place])    a. Echo 12/16: mild LVH, EF 55-60%, no RWMA, Gr 1 DD, mild AI, MAC, mild LAE, normal RVF, PASP 37 mmHg  . Chronic kidney disease    chronic   . DJD (degenerative joint disease)   . Dysrhythmia    pvc's   . GERD 12/03/2007  . Hiatal hernia   . History of nuclear stress test    Myoview 1/17: EF 53%, normal perfusion; Low Risk  . HYPERLIPIDEMIA 04/05/2007  . Hypothyroidism   . Osteoarth NOS-Unspec 04/05/2007  . Palpitations 02/12/2010  . PEDAL EDEMA 05/01/2008  . Rheumatoid arthritis(714.0) 04/05/2007  . Shortness of breath dyspnea   . TRANSIENT ISCHEMIC ATTACK, HX OF 04/05/2007   ?    Past Surgical History:  Procedure Laterality Date  . BALLOON DILATION N/A 03/12/2013   Procedure: BALLOON DILATION;  Surgeon: Inda Castle, MD;  Location: [facility];  Service: Endoscopy;  Laterality: N/A;  . CARPAL TUNNEL RELEASE  11/28/2018   Left hand  . CATARACT EXTRACTION  2001   bilateral  . CYSTOSCOPY WITH URETHRAL DILATATION N/A 09/24/2015   Procedure: CYSTOSCOPY WITH COOK BALLOON DILATATION OF URETHRAL STRICTURE ;  Surgeon: Carolan Clines, MD;  Location: [facility];  Service: Urology;  Laterality: N/A;  . CYSTOSCOPY WITH URETHRAL DILATATION N/A 07/20/2016   Procedure: CYSTOSCOPY WITH URETHRAL DILATATION;  Surgeon: Alexis Frock, MD;  Location:  [facility];  Service: Urology;  Laterality: N/A;  1 HOUR  . ESOPHAGOGASTRODUODENOSCOPY N/A 03/12/2013   Procedure: ESOPHAGOGASTRODUODENOSCOPY (EGD);  Surgeon: Inda Castle, MD;  Location: [facility];  Service: Endoscopy;  Laterality: N/A;  . HEMORRHOID SURGERY    . HERNIA REPAIR     inguinal x5  . KNEE SURGERY  arthroscopic   left  . PROSTATE CRYOABLATION     2000  . SHOULDER SURGERY  few years ago   left  . TRANSURETHRAL RESECTION OF PROSTATE N/A 03/19/2015   Procedure: TRANSURETHRAL RESECTION OF THE PROSTATE (TURP);  Surgeon: Carolan Clines, MD;  Location: [facility];  Service: Urology;  Laterality: N/A;    reports that he quit smoking about 65 years ago. His smoking use included cigarettes. He has never used smokeless tobacco. He reports current alcohol use. He reports that he does not use drugs. family history includes Breast cancer in his mother; Heart attack in his father; Heart disease in his father; Lymphoma in his brother. Allergies  Allergen Reactions  . Codeine     Per pt's son  . Sulfasalazine     Patient does not remember allergy symptoms  . Sulfonamide Derivatives Other (See Comments)    Patient does not remember allergy symptoms     Review of Systems  HENT: Positive for hearing loss. Negative for ear discharge and ear pain.   Neurological: Negative for dizziness, facial  asymmetry, speech difficulty, weakness and headaches.       Objective:   Physical Exam Constitutional:      Appearance: Normal appearance.  HENT:     Head:     Comments: Patient has moderate cerumen right canal but not fully impacting ear canal.  Left eardrum and ear canal are clear Neurological:     General: No focal deficit present.     Mental Status: He is alert.     Cranial Nerves: No cranial nerve deficit.     Motor: No weakness.        Assessment:      #1 cerumen impaction right canal  #2 sudden hearing loss right ear of one day duration- not improved with cerumen removal.   ?sudden sensorineural hearing loss.  No focal neuro deficits.    Plan:     -Irrigation of right ear canal -we recommended trial of prednisone for his sudden right hearing loss.  Reviewed possible side effects of prednisone. -reassess in one week  [name] MD [facility]

## 2018-12-04 NOTE — Patient Instructions (Signed)
Hearing Loss  Hearing loss is a partial or total loss of the ability to hear. This can be temporary or permanent, and it can happen in one or both ears. Hearing loss may be referred to as deafness. Medical care is necessary to treat hearing loss properly and to prevent the condition from getting worse. Your hearing may partially or completely come back, depending on what caused your hearing loss and how severe it is. In some cases, hearing loss is permanent. What are the causes? Common causes of hearing loss include:  Too much wax in the ear canal.  Infection of the ear canal or middle ear.  Fluid in the middle ear.  Injury to the ear or surrounding area.  An object stuck in the ear.  Prolonged exposure to loud sounds, such as music. Less common causes of hearing loss include:  Tumors in the ear.  Viral or bacterial infections, such as meningitis.  A hole in the eardrum (perforated eardrum).  Problems with the hearing nerve that sends signals between the brain and the ear.  Certain medicines. What are the signs or symptoms? Symptoms of this condition may include:  Difficulty telling the difference between sounds.  Difficulty following a conversation when there is background noise.  Lack of response to sounds in your environment. This may be most noticeable when you do not respond to startling sounds.  Needing to turn up the volume on the television, radio, etc.  Ringing in the ears.  Dizziness.  Pain in the ears. How is this diagnosed? This condition is diagnosed based on a physical exam and a hearing test (audiometry). The audiometry test will be performed by a hearing specialist (audiologist). You may also be referred to an ear, nose, and throat (ENT) specialist (otolaryngologist). How is this treated? Treatment for recent onset of hearing loss may include:  Ear wax removal.  Being prescribed medicines to prevent infection (antibiotics).  Being prescribed  medicines to reduce inflammation (corticosteroids). Follow these instructions at home:  If you were prescribed an antibiotic medicine, take it as told by your health care provider. Do not stop taking the antibiotic even if you start to feel better.  Take over-the-counter and prescription medicines only as told by your health care provider.  Avoid loud noises.  Return to your normal activities as told by your health care provider. Ask your health care provider what activities are safe for you.  Keep all follow-up visits as told by your health care provider. This is important. Contact a health care provider if:  You feel dizzy.  You develop new symptoms.  You vomit or feel nauseous.  You have a fever. Get help right away if:  You develop sudden changes in your vision.  You have severe ear pain.  You have new or increased weakness.  You have a severe headache. This information is not intended to replace advice given to you by your health care provider. Make sure you discuss any questions you have with your health care provider. Document Released: 11/07/2005 Document Revised: 04/14/2016 Document Reviewed: 03/25/2015 Elsevier Interactive Patient Education  2019 [employer]  

## 2018-12-08 ENCOUNTER — Other Ambulatory Visit: Payer: Self-pay | Admitting: Family Medicine

## 2018-12-12 ENCOUNTER — Ambulatory Visit (INDEPENDENT_AMBULATORY_CARE_PROVIDER_SITE_OTHER): Payer: Medicare Other | Admitting: Family Medicine

## 2018-12-12 ENCOUNTER — Encounter: Payer: Self-pay | Admitting: Family Medicine

## 2018-12-12 ENCOUNTER — Other Ambulatory Visit: Payer: Self-pay

## 2018-12-12 VITALS — BP 116/70 | HR 87 | Temp 97.3°F | Ht 62.0 in

## 2018-12-12 DIAGNOSIS — K219 Gastro-esophageal reflux disease without esophagitis: Secondary | ICD-10-CM

## 2018-12-12 DIAGNOSIS — E039 Hypothyroidism, unspecified: Secondary | ICD-10-CM

## 2018-12-12 DIAGNOSIS — I5032 Chronic diastolic (congestive) heart failure: Secondary | ICD-10-CM | POA: Diagnosis not present

## 2018-12-12 DIAGNOSIS — H9120 Sudden idiopathic hearing loss, unspecified ear: Secondary | ICD-10-CM

## 2018-12-12 NOTE — Progress Notes (Signed)
Subjective:     Patient ID: Robert Rowe, male   DOB: [birth year], 85 y.o.   MRN: [id number]  HPI Patient seen for medical follow-up.  He was seen about a week ago with sudden right-sided hearing loss.  He had some cerumen and after removal this did not change his hearing any.  We suspected sudden sensorineural hearing loss.  He did not have any other focal neurologic symptoms.  We placed him on prednisone and his hearing has not changed much over the past week.  He denies any vertigo.  No headache.  No slurred speech.  No focal weakness.  Chronic problems include history of diastolic heart failure, GERD, hypothyroidism, chronic kidney disease  He had thyroid functions and basic chemistry back in September and these were stable.  His renal function was slightly improved.  He has not had any recent peripheral edema issues.  Takes Lasix only infrequently as needed.  His chronic medications include Plavix 75 mg daily, Lasix 20 mg daily as needed, metoprolol 25 mg daily, Protonix 40 mg daily.  His reflux symptoms have been controlled with Protonix.  No dysphagia.  Appetite and weight are stable.  No recent chest pains.  Denies any recent falls.  He ambulates with a walker.  His wife is trying to look at lining up some sitter helps that she can get out to run errands.  Past Medical History:  Diagnosis Date  . BENIGN PROSTATIC HYPERTROPHY 04/05/2007  . 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 CHEST DISCOMFORT 04/25/2009  . Chronic diastolic CHF (congestive heart failure) ([place])    a. Echo 12/16: mild LVH, EF 55-60%, no RWMA, Gr 1 DD, mild AI, MAC, mild LAE, normal RVF, PASP 37 mmHg  . Chronic kidney disease    chronic   . DJD (degenerative joint disease)   . Dysrhythmia    pvc's   . GERD 12/03/2007  . Hiatal hernia   . History of nuclear stress test    Myoview 1/17: EF 53%, normal perfusion; Low Risk  . HYPERLIPIDEMIA 04/05/2007  . Hypothyroidism   . Osteoarth NOS-Unspec  04/05/2007  . Palpitations 02/12/2010  . PEDAL EDEMA 05/01/2008  . Rheumatoid arthritis(714.0) 04/05/2007  . Shortness of breath dyspnea   . TRANSIENT ISCHEMIC ATTACK, HX OF 04/05/2007   ?    Past Surgical History:  Procedure Laterality Date  . BALLOON DILATION N/A 03/12/2013   Procedure: BALLOON DILATION;  Surgeon: Inda Castle, MD;  Location: [facility];  Service: Endoscopy;  Laterality: N/A;  . CARPAL TUNNEL RELEASE  11/28/2018   Left hand  . CATARACT EXTRACTION  2001   bilateral  . CYSTOSCOPY WITH URETHRAL DILATATION N/A 09/24/2015   Procedure: CYSTOSCOPY WITH COOK BALLOON DILATATION OF URETHRAL STRICTURE ;  Surgeon: Carolan Clines, MD;  Location: [facility];  Service: Urology;  Laterality: N/A;  . CYSTOSCOPY WITH URETHRAL DILATATION N/A 07/20/2016   Procedure: CYSTOSCOPY WITH URETHRAL DILATATION;  Surgeon: Alexis Frock, MD;  Location: [facility];  Service: Urology;  Laterality: N/A;  1 HOUR  . ESOPHAGOGASTRODUODENOSCOPY N/A 03/12/2013   Procedure: ESOPHAGOGASTRODUODENOSCOPY (EGD);  Surgeon: Inda Castle, MD;  Location: [facility];  Service: Endoscopy;  Laterality: N/A;  . HEMORRHOID SURGERY    . HERNIA REPAIR     inguinal x5  . KNEE SURGERY  arthroscopic   left  . PROSTATE CRYOABLATION     2000  . SHOULDER SURGERY  few years ago   left  . TRANSURETHRAL RESECTION OF  PROSTATE N/A 03/19/2015   Procedure: TRANSURETHRAL RESECTION OF THE PROSTATE (TURP);  Surgeon: Carolan Clines, MD;  Location: [facility];  Service: Urology;  Laterality: N/A;    reports that he quit smoking about 65 years ago. His smoking use included cigarettes. He has never used smokeless tobacco. He reports current alcohol use. He reports that he does not use drugs. family history includes Breast cancer in his mother; Heart attack in his father; Heart disease in his father; Lymphoma in his brother. Allergies  Allergen Reactions  . Codeine     Per pt's son  . Sulfasalazine     Patient does not remember allergy  symptoms  . Sulfonamide Derivatives Other (See Comments)    Patient does not remember allergy symptoms     Review of Systems  Constitutional: Negative for chills and fever.  HENT: Positive for hearing loss. Negative for ear discharge.   Respiratory: Negative for shortness of breath.   Cardiovascular: Negative for chest pain.  Gastrointestinal: Negative for abdominal pain.  Neurological: Negative for dizziness, seizures, syncope, speech difficulty, weakness and headaches.  Psychiatric/Behavioral: Negative for agitation.       Objective:   Physical Exam Constitutional:      Appearance: Normal appearance.  HENT:     Right Ear: Tympanic membrane, ear canal and external ear normal.     Left Ear: Tympanic membrane, ear canal and external ear normal.  Cardiovascular:     Rate and Rhythm: Normal rate and regular rhythm.  Pulmonary:     Effort: Pulmonary effort is normal.     Breath sounds: Normal breath sounds.  Musculoskeletal:     Right lower leg: No edema.     Left lower leg: No edema.  Neurological:     General: No focal deficit present.     Mental Status: He is alert and oriented to person, place, and time.     Cranial Nerves: No cranial nerve deficit.     Motor: No weakness.        Assessment:     #1 recent sudden right-sided hearing loss.  Suspect sudden sensorineural hearing loss.  No focal neurologic symptoms.  Unfortunately, he did not responded to prednisone thus far.  #2 hypothyroidism on replacement.  Recent TSH reviewed and stable  #3 GERD controlled for the most part on Protonix.  #4 history of chronic diastolic heart failure.  Symptomatically stable at this time.  #5 hypertension stable and at goal  #6 chronic kidney disease    Plan:     -Offered ENT referral but at this point he wishes to observe. -Continue current medications -We will plan routine follow-up in 6 months and sooner as needed -We agreed with their plan to look at getting occasional  sitter help 2 or 3 times weekly to free up time for his wife to get some things done  [name] MD [facility]

## 2018-12-12 NOTE — Patient Instructions (Signed)
Hearing Loss  Hearing loss is a partial or total loss of the ability to hear. This can be temporary or permanent, and it can happen in one or both ears. Hearing loss may be referred to as deafness. Medical care is necessary to treat hearing loss properly and to prevent the condition from getting worse. Your hearing may partially or completely come back, depending on what caused your hearing loss and how severe it is. In some cases, hearing loss is permanent. What are the causes? Common causes of hearing loss include:  Too much wax in the ear canal.  Infection of the ear canal or middle ear.  Fluid in the middle ear.  Injury to the ear or surrounding area.  An object stuck in the ear.  Prolonged exposure to loud sounds, such as music. Less common causes of hearing loss include:  Tumors in the ear.  Viral or bacterial infections, such as meningitis.  A hole in the eardrum (perforated eardrum).  Problems with the hearing nerve that sends signals between the brain and the ear.  Certain medicines. What are the signs or symptoms? Symptoms of this condition may include:  Difficulty telling the difference between sounds.  Difficulty following a conversation when there is background noise.  Lack of response to sounds in your environment. This may be most noticeable when you do not respond to startling sounds.  Needing to turn up the volume on the television, radio, etc.  Ringing in the ears.  Dizziness.  Pain in the ears. How is this diagnosed? This condition is diagnosed based on a physical exam and a hearing test (audiometry). The audiometry test will be performed by a hearing specialist (audiologist). You may also be referred to an ear, nose, and throat (ENT) specialist (otolaryngologist). How is this treated? Treatment for recent onset of hearing loss may include:  Ear wax removal.  Being prescribed medicines to prevent infection (antibiotics).  Being prescribed  medicines to reduce inflammation (corticosteroids). Follow these instructions at home:  If you were prescribed an antibiotic medicine, take it as told by your health care provider. Do not stop taking the antibiotic even if you start to feel better.  Take over-the-counter and prescription medicines only as told by your health care provider.  Avoid loud noises.  Return to your normal activities as told by your health care provider. Ask your health care provider what activities are safe for you.  Keep all follow-up visits as told by your health care provider. This is important. Contact a health care provider if:  You feel dizzy.  You develop new symptoms.  You vomit or feel nauseous.  You have a fever. Get help right away if:  You develop sudden changes in your vision.  You have severe ear pain.  You have new or increased weakness.  You have a severe headache. This information is not intended to replace advice given to you by your health care provider. Make sure you discuss any questions you have with your health care provider. Document Released: 11/07/2005 Document Revised: 04/14/2016 Document Reviewed: 03/25/2015 Elsevier Interactive Patient Education  2019 [employer]  

## 2018-12-22 DIAGNOSIS — K810 Acute cholecystitis: Secondary | ICD-10-CM

## 2018-12-22 HISTORY — DX: Acute cholecystitis: K81.0

## 2018-12-25 DIAGNOSIS — N35010 Post-traumatic urethral stricture, male, meatal: Secondary | ICD-10-CM | POA: Diagnosis not present

## 2018-12-25 DIAGNOSIS — R3912 Poor urinary stream: Secondary | ICD-10-CM | POA: Diagnosis not present

## 2018-12-27 ENCOUNTER — Telehealth: Payer: Self-pay

## 2018-12-27 NOTE — Telephone Encounter (Signed)
Copied from [place] [phone]. Topic: Referral - Request for Referral >> Dec 27, 2018 10:21 AM Bea Graff, NT wrote: Has patient seen PCP for this complaint? Yes.   *If NO, is insurance requiring patient see PCP for this issue before PCP can refer them? Referral for which specialty: Podiatrist Preferred provider/office: any  Reason for referral: Ingrown toenail

## 2018-12-27 NOTE — Telephone Encounter (Signed)
We actually have not seen him for ingrown toenail.  We can take care of ingrown toenail,.   However, if he has established relationship with podiatrist we can go ahead and refer if he prefers.

## 2018-12-31 ENCOUNTER — Other Ambulatory Visit: Payer: Self-pay

## 2018-12-31 ENCOUNTER — Emergency Department (HOSPITAL_COMMUNITY): Payer: Medicare Other

## 2018-12-31 ENCOUNTER — Encounter (HOSPITAL_COMMUNITY): Payer: Self-pay | Admitting: *Deleted

## 2018-12-31 ENCOUNTER — Ambulatory Visit: Payer: Medicare Other | Admitting: Family Medicine

## 2018-12-31 ENCOUNTER — Inpatient Hospital Stay (HOSPITAL_COMMUNITY)
Admission: EM | Admit: 2018-12-31 | Discharge: 2019-01-03 | DRG: 065 | Disposition: A | Discharge status: Rehabilitation Facility | Payer: Medicare Other | Attending: Internal Medicine | Admitting: Internal Medicine

## 2018-12-31 ENCOUNTER — Ambulatory Visit: Payer: Self-pay

## 2018-12-31 ENCOUNTER — Telehealth: Payer: Self-pay | Admitting: *Deleted

## 2018-12-31 DIAGNOSIS — R3915 Urgency of urination: Secondary | ICD-10-CM | POA: Diagnosis present

## 2018-12-31 DIAGNOSIS — Z8249 Family history of ischemic heart disease and other diseases of the circulatory system: Secondary | ICD-10-CM

## 2018-12-31 DIAGNOSIS — M069 Rheumatoid arthritis, unspecified: Secondary | ICD-10-CM | POA: Diagnosis present

## 2018-12-31 DIAGNOSIS — R42 Dizziness and giddiness: Secondary | ICD-10-CM

## 2018-12-31 DIAGNOSIS — R29700 NIHSS score 0: Secondary | ICD-10-CM | POA: Diagnosis present

## 2018-12-31 DIAGNOSIS — Z885 Allergy status to narcotic agent status: Secondary | ICD-10-CM

## 2018-12-31 DIAGNOSIS — R269 Unspecified abnormalities of gait and mobility: Secondary | ICD-10-CM

## 2018-12-31 DIAGNOSIS — I340 Nonrheumatic mitral (valve) insufficiency: Secondary | ICD-10-CM | POA: Diagnosis not present

## 2018-12-31 DIAGNOSIS — R296 Repeated falls: Secondary | ICD-10-CM | POA: Diagnosis not present

## 2018-12-31 DIAGNOSIS — I639 Cerebral infarction, unspecified: Secondary | ICD-10-CM | POA: Diagnosis present

## 2018-12-31 DIAGNOSIS — R32 Unspecified urinary incontinence: Secondary | ICD-10-CM | POA: Diagnosis present

## 2018-12-31 DIAGNOSIS — N183 Chronic kidney disease, stage 3 unspecified: Secondary | ICD-10-CM | POA: Diagnosis present

## 2018-12-31 DIAGNOSIS — H5461 Unqualified visual loss, right eye, normal vision left eye: Secondary | ICD-10-CM | POA: Diagnosis present

## 2018-12-31 DIAGNOSIS — Z7902 Long term (current) use of antithrombotics/antiplatelets: Secondary | ICD-10-CM | POA: Diagnosis not present

## 2018-12-31 DIAGNOSIS — G459 Transient cerebral ischemic attack, unspecified: Secondary | ICD-10-CM | POA: Insufficient documentation

## 2018-12-31 DIAGNOSIS — Z79899 Other long term (current) drug therapy: Secondary | ICD-10-CM | POA: Diagnosis not present

## 2018-12-31 DIAGNOSIS — N401 Enlarged prostate with lower urinary tract symptoms: Secondary | ICD-10-CM | POA: Diagnosis present

## 2018-12-31 DIAGNOSIS — E785 Hyperlipidemia, unspecified: Secondary | ICD-10-CM | POA: Diagnosis present

## 2018-12-31 DIAGNOSIS — Z79890 Hormone replacement therapy (postmenopausal): Secondary | ICD-10-CM

## 2018-12-31 DIAGNOSIS — I10 Essential (primary) hypertension: Secondary | ICD-10-CM

## 2018-12-31 DIAGNOSIS — H9191 Unspecified hearing loss, right ear: Secondary | ICD-10-CM

## 2018-12-31 DIAGNOSIS — I69398 Other sequelae of cerebral infarction: Secondary | ICD-10-CM

## 2018-12-31 DIAGNOSIS — I130 Hypertensive heart and chronic kidney disease with heart failure and stage 1 through stage 4 chronic kidney disease, or unspecified chronic kidney disease: Secondary | ICD-10-CM | POA: Diagnosis not present

## 2018-12-31 DIAGNOSIS — I6523 Occlusion and stenosis of bilateral carotid arteries: Secondary | ICD-10-CM | POA: Diagnosis not present

## 2018-12-31 DIAGNOSIS — Z87891 Personal history of nicotine dependence: Secondary | ICD-10-CM

## 2018-12-31 DIAGNOSIS — Z882 Allergy status to sulfonamides status: Secondary | ICD-10-CM

## 2018-12-31 DIAGNOSIS — H5462 Unqualified visual loss, left eye, normal vision right eye: Secondary | ICD-10-CM | POA: Diagnosis not present

## 2018-12-31 DIAGNOSIS — I5032 Chronic diastolic (congestive) heart failure: Secondary | ICD-10-CM | POA: Diagnosis not present

## 2018-12-31 DIAGNOSIS — I63512 Cerebral infarction due to unspecified occlusion or stenosis of left middle cerebral artery: Secondary | ICD-10-CM | POA: Diagnosis not present

## 2018-12-31 DIAGNOSIS — H8113 Benign paroxysmal vertigo, bilateral: Secondary | ICD-10-CM | POA: Diagnosis not present

## 2018-12-31 DIAGNOSIS — H8110 Benign paroxysmal vertigo, unspecified ear: Secondary | ICD-10-CM | POA: Diagnosis present

## 2018-12-31 DIAGNOSIS — G450 Vertebro-basilar artery syndrome: Secondary | ICD-10-CM | POA: Diagnosis not present

## 2018-12-31 DIAGNOSIS — K219 Gastro-esophageal reflux disease without esophagitis: Secondary | ICD-10-CM | POA: Diagnosis not present

## 2018-12-31 DIAGNOSIS — E039 Hypothyroidism, unspecified: Secondary | ICD-10-CM | POA: Diagnosis present

## 2018-12-31 DIAGNOSIS — R159 Full incontinence of feces: Secondary | ICD-10-CM | POA: Diagnosis present

## 2018-12-31 DIAGNOSIS — Z8673 Personal history of transient ischemic attack (TIA), and cerebral infarction without residual deficits: Secondary | ICD-10-CM

## 2018-12-31 DIAGNOSIS — I6602 Occlusion and stenosis of left middle cerebral artery: Secondary | ICD-10-CM | POA: Diagnosis not present

## 2018-12-31 LAB — CBC
HCT: 48.3 % (ref 39.0–52.0)
Hemoglobin: 15.8 g/dL (ref 13.0–17.0)
MCH: 32.2 pg (ref 26.0–34.0)
MCHC: 32.7 g/dL (ref 30.0–36.0)
MCV: 98.4 fL (ref 80.0–100.0)
Platelets: 269 10*3/uL (ref 150–400)
RBC: 4.91 MIL/uL (ref 4.22–5.81)
RDW: 13.2 % (ref 11.5–15.5)
WBC: 5.9 10*3/uL (ref 4.0–10.5)
nRBC: 0 % (ref 0.0–0.2)

## 2018-12-31 LAB — I-STAT TROPONIN, ED: Troponin i, poc: 0.01 ng/mL (ref 0.00–0.08)

## 2018-12-31 LAB — BASIC METABOLIC PANEL
Anion gap: 9 (ref 5–15)
BUN: 16 mg/dL (ref 8–23)
CO2: 27 mmol/L (ref 22–32)
Calcium: 9.7 mg/dL (ref 8.9–10.3)
Chloride: 105 mmol/L (ref 98–111)
Creatinine, Ser: 1.56 mg/dL — ABNORMAL HIGH (ref 0.61–1.24)
GFR calc Af Amer: 42 mL/min — ABNORMAL LOW (ref >60)
GFR calc non Af Amer: 36 mL/min — ABNORMAL LOW (ref >60)
Glucose, Bld: 156 mg/dL — ABNORMAL HIGH (ref 70–99)
Potassium: 4.9 mmol/L (ref 3.5–5.1)
Sodium: 141 mmol/L (ref 135–145)

## 2018-12-31 LAB — URINALYSIS, ROUTINE W REFLEX MICROSCOPIC
Bilirubin Urine: NEGATIVE
Glucose, UA: NEGATIVE mg/dL
Hgb urine dipstick: NEGATIVE
Ketones, ur: NEGATIVE mg/dL
Leukocytes, UA: NEGATIVE
Nitrite: NEGATIVE
Protein, ur: NEGATIVE mg/dL
Specific Gravity, Urine: 1.009 (ref 1.005–1.030)
pH: 7 (ref 5.0–8.0)

## 2018-12-31 LAB — CBG MONITORING, ED: Glucose-Capillary: 154 mg/dL — ABNORMAL HIGH (ref 70–99)

## 2018-12-31 LAB — TSH: TSH: 2.124 u[IU]/mL (ref 0.350–4.500)

## 2018-12-31 MED ORDER — SODIUM CHLORIDE 0.9% FLUSH
3.0000 mL | Freq: Once | INTRAVENOUS | Status: AC
  Administered 2018-12-31: 3 mL via INTRAVENOUS

## 2018-12-31 MED ORDER — ALBUTEROL SULFATE (2.5 MG/3ML) 0.083% IN NEBU: 2.5000 mg | INHALATION_SOLUTION | RESPIRATORY_TRACT | Status: DC | PRN

## 2018-12-31 MED ORDER — ASPIRIN 300 MG RE SUPP: 300.0000 mg | Freq: Every day | RECTAL | Status: DC

## 2018-12-31 MED ORDER — STROKE: EARLY STAGES OF RECOVERY BOOK
Freq: Once | Status: AC
  Administered 2018-12-31: 21:00:00
  Filled 2018-12-31: qty 1

## 2018-12-31 MED ORDER — ACETAMINOPHEN 325 MG PO TABS: 650.0000 mg | ORAL_TABLET | Freq: Four times a day (QID) | ORAL | Status: DC | PRN

## 2018-12-31 MED ORDER — LABETALOL HCL 5 MG/ML IV SOLN: 5.0000 mg | Freq: Once | INTRAVENOUS | Status: DC

## 2018-12-31 MED ORDER — ACETAMINOPHEN 650 MG RE SUPP: 650.0000 mg | Freq: Four times a day (QID) | RECTAL | Status: DC | PRN

## 2018-12-31 MED ORDER — ONDANSETRON HCL 4 MG PO TABS: 4.0000 mg | ORAL_TABLET | Freq: Four times a day (QID) | ORAL | Status: DC | PRN

## 2018-12-31 MED ORDER — LATANOPROST 0.005 % OP SOLN
1.0000 [drp] | Freq: Every day | OPHTHALMIC | Status: DC
  Administered 2019-01-01 – 2019-01-02 (×2): 1 [drp] via OPHTHALMIC
  Filled 2018-12-31: qty 2.5

## 2018-12-31 MED ORDER — LABETALOL HCL 5 MG/ML IV SOLN
5.0000 mg | Freq: Once | INTRAVENOUS | Status: AC
  Administered 2018-12-31: 5 mg via INTRAVENOUS
  Filled 2018-12-31: qty 4

## 2018-12-31 MED ORDER — ONDANSETRON HCL 4 MG/2ML IJ SOLN: 4.0000 mg | Freq: Four times a day (QID) | INTRAMUSCULAR | Status: DC | PRN

## 2018-12-31 MED ORDER — ASPIRIN 325 MG PO TABS
325.0000 mg | ORAL_TABLET | Freq: Every day | ORAL | Status: DC
  Administered 2018-12-31 – 2019-01-02 (×3): 325 mg via ORAL
  Filled 2018-12-31 (×3): qty 1

## 2018-12-31 MED ORDER — ATORVASTATIN CALCIUM 80 MG PO TABS
80.0000 mg | ORAL_TABLET | Freq: Every day | ORAL | Status: DC
  Administered 2019-01-01 – 2019-01-02 (×2): 80 mg via ORAL
  Filled 2018-12-31 (×2): qty 1

## 2018-12-31 MED ORDER — CLOPIDOGREL BISULFATE 75 MG PO TABS
75.0000 mg | ORAL_TABLET | Freq: Every day | ORAL | Status: DC
  Administered 2018-12-31 – 2019-01-03 (×4): 75 mg via ORAL
  Filled 2018-12-31 (×4): qty 1

## 2018-12-31 MED ORDER — PANTOPRAZOLE SODIUM 40 MG PO TBEC
40.0000 mg | DELAYED_RELEASE_TABLET | Freq: Every day | ORAL | Status: DC
  Administered 2019-01-01 – 2019-01-03 (×3): 40 mg via ORAL
  Filled 2018-12-31 (×4): qty 1

## 2018-12-31 MED ORDER — LEVOTHYROXINE SODIUM 50 MCG PO TABS
50.0000 ug | ORAL_TABLET | Freq: Every day | ORAL | Status: DC
  Administered 2019-01-01 – 2019-01-03 (×3): 50 ug via ORAL
  Filled 2018-12-31 (×3): qty 1

## 2018-12-31 NOTE — ED Notes (Signed)
Report given to 3W RN. All questions answered.

## 2018-12-31 NOTE — Telephone Encounter (Signed)
Called Soyla Murphy his wife and let her know that she did the right thing by going to the ER. Annemarie verbalized an understanding and will keep [place] informed if she needs anything.

## 2018-12-31 NOTE — Telephone Encounter (Signed)
Copied from [place]. Topic: General - Inquiry >> Dec 31, 2018 10:18 AM Conception Chancy, NT wrote: Reason for CRM: patient is requesting Dr. Elease Hashimoto to call him directly.

## 2018-12-31 NOTE — ED Notes (Signed)
Patient transported to MRI 

## 2018-12-31 NOTE — Telephone Encounter (Signed)
Did you see where the patient wants you to call him directly?

## 2018-12-31 NOTE — H&P (Signed)
LIBERTY STEAD WLN:[id number] DOB: 01-25-1918 DOA: 12/31/2018     PCP: [name], MD   Outpatient Specialists:   CARDS:  Dr. [name]  Neurology Dr. Delice Lesch  Patient arrived to ER on 12/31/18 at 1218  Patient coming from: home Lives  With family    Chief Complaint: Vertigo  HPI: Robert Rowe is a 85 y.o. male with medical history significant of hypothyroidism, GERD, chronic diastolic CHF HTN, CKD  Presented with vertigo symptoms every morning for the past 2 to 3 days.  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  Today noted to be hypotensive with blood pressure 100/70 Unsteady when he walks feels very weak overall. Had a fall today while in the bathroom.  No LOC did not hit his head.  He hit his right knee and hand.  No associated chest pain shortness of breath nausea vomiting  No slurred speech no visual changes no one-sided weakness.  She attempted to use meclizine but did not seem to help his symptoms.  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 Urine culture  . MR BRAIN WO CONTRAST  . MR MRA HEAD WO CONTRAST  . MR MRA NECK WO CONTRAST  . Basic metabolic panel  . CBC  . Urinalysis, Routine w reflex microscopic  . TSH  . Cardiac monitoring  . Saline Lock IV, Maintain IV access  . Orthostatic vital signs  . Consult to hospitalist  . Pulse oximetry, continuous  . CBG  monitoring, ED  . I-Stat Troponin, ED (not at [facility])  . ED EKG  . EKG 12-Lead  . EEG     Following Medications were ordered in ER: Medications  sodium chloride flush (NS) 0.9 % injection 3 mL (3 mLs Intravenous Given 12/31/18 1521)  labetalol (NORMODYNE,TRANDATE) injection 5 mg (5 mg Intravenous Given 12/31/18 1526)    Significant initial  Findings: Abnormal Labs Reviewed  BASIC METABOLIC PANEL - Abnormal; Notable for the following components:      Result Value   Glucose, Bld 156 (*)    Creatinine, Ser 1.56 (*)    GFR calc non Af Amer 36 (*)    GFR calc Af Amer 42 (*)    All other components within normal limits  CBG MONITORING, ED - Abnormal; Notable for the following components:   Glucose-Capillary 154 (*)    All other components within normal limits   TSH 2.124  Lactic Acid, Venous No results found for: LATICACIDVEN  Na  141 K 4.9  Cr    Stable  Lab Results  Component Value Date   CREATININE 1.56 (H) 12/31/2018   CREATININE 1.46 08/10/2018   CREATININE 1.70 (H) 05/15/2018      WBC 5.9  HG/HCT  stable,       Component Value Date/Time   HGB 15.8 12/31/2018 1227  HCT 48.3 12/31/2018 1227       Troponin (Point of Care Test) Recent Labs    12/31/18 1354  TROPIPOC 0.01     BNP (last 3 results) No results for input(s): BNP in the last 8760 hours.  ProBNP (last 3 results) No results for input(s): PROBNP in the last 8760 hours.     UA   no evidence of UTI     MRI/A brain neck right vertebral artery, which is largely occluded within the neck, 6 mm acute ischemic nonhemorrhagic infarct involving     ECG:  Personally reviewed by me showing: HR : 82  Rhythm: NSR   no evidence of ischemic changes QTC 467      ED Triage Vitals  Enc Vitals Group     BP 12/31/18 1223 (!) 191/121     Pulse Rate 12/31/18 1223 84     Resp 12/31/18 1223 18     Temp 12/31/18 1223 98 F (36.7 C)     Temp Source 12/31/18 1223 Oral     SpO2 12/31/18 1314 100 %      Weight 12/31/18 1223 145 lb (65.8 kg)     Height 12/31/18 1223 5' 2"  (1.575 m)     Head Circumference --      Peak Flow --      Pain Score 12/31/18 1223 2     Pain Loc --      Pain Edu? --      Excl. in [place]? --   TMAX(24)@       Latest  Blood pressure (!) 139/96, pulse 86, temperature 98 F (36.7 C), temperature source Oral, resp. rate 18, height 5' 2"  (1.575 m), weight 65.8 kg, SpO2 98 %.    ER Provider Called:  Neurology   Dr.Aroor They Recommend admit to medicine for TIA/CVA work up Will see   in [place] was called for admission for Vertigo TIA vs CVA work up   Review of Systems:    Pertinent positives include: fatigue, fall vertigo  Constitutional:  No weight loss, night sweats, Fevers, chills,  weight loss  HEENT:  No headaches, Difficulty swallowing,Tooth/dental problems,Sore throat,  No sneezing, itching, ear ache, nasal congestion, post nasal drip,  Cardio-vascular:  No chest pain, Orthopnea, PND, anasarca, dizziness, palpitations.no Bilateral lower extremity swelling  GI:  No heartburn, indigestion, abdominal pain, nausea, vomiting, diarrhea, change in bowel habits, loss of appetite, melena, blood in stool, hematemesis Resp:  no shortness of breath at rest. No dyspnea on exertion, No excess mucus, no productive cough, No non-productive cough, No coughing up of blood.No change in color of mucus.No wheezing. Skin:  no rash or lesions. No jaundice GU:  no dysuria, change in color of urine, no urgency or frequency. No straining to urinate.  No flank pain.  Musculoskeletal:  No joint pain or no joint swelling. No decreased range of motion. No back pain.  Psych:  No change in mood or affect. No depression or anxiety. No memory loss.  Neuro: no localizing neurological complaints, no tingling, no weakness, no double vision, no gait abnormality, no slurred speech, no confusion  All systems reviewed and apart from [place] all are negative  Past Medical History:     Past Medical History:  Diagnosis Date  . BENIGN PROSTATIC HYPERTROPHY 04/05/2007  . 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 CHEST DISCOMFORT 04/25/2009  . Chronic diastolic CHF (congestive heart failure) ([place])    a. Echo 12/16:  mild LVH, EF 55-60%, no RWMA, Gr 1 DD, mild AI, MAC, mild LAE, normal RVF, PASP 37 mmHg  . Chronic kidney disease    chronic   . DJD (degenerative joint disease)   . Dysrhythmia    pvc's   . GERD 12/03/2007  . Hiatal hernia   . History of nuclear stress test    Myoview 1/17: EF 53%, normal perfusion; Low Risk  . HYPERLIPIDEMIA 04/05/2007  . Hypothyroidism   . Osteoarth NOS-Unspec 04/05/2007  . Palpitations 02/12/2010  . PEDAL EDEMA 05/01/2008  . Rheumatoid arthritis(714.0) 04/05/2007  . Shortness of breath dyspnea   . TRANSIENT ISCHEMIC ATTACK, HX OF 04/05/2007   ?       Past Surgical History:  Procedure Laterality Date  . BALLOON DILATION N/A 03/12/2013   Procedure: BALLOON DILATION;  Surgeon: Inda Castle, MD;  Location: [facility];  Service: Endoscopy;  Laterality: N/A;  . CARPAL TUNNEL RELEASE  11/28/2018   Left hand  . CATARACT EXTRACTION  2001   bilateral  . CYSTOSCOPY WITH URETHRAL DILATATION N/A 09/24/2015   Procedure: CYSTOSCOPY WITH COOK BALLOON DILATATION OF URETHRAL STRICTURE ;  Surgeon: Carolan Clines, MD;  Location: [facility];  Service: Urology;  Laterality: N/A;  . CYSTOSCOPY WITH URETHRAL DILATATION N/A 07/20/2016   Procedure: CYSTOSCOPY WITH URETHRAL DILATATION;  Surgeon: Alexis Frock, MD;  Location: [facility];  Service: Urology;  Laterality: N/A;  1 HOUR  . ESOPHAGOGASTRODUODENOSCOPY N/A 03/12/2013   Procedure: ESOPHAGOGASTRODUODENOSCOPY (EGD);  Surgeon: Inda Castle, MD;  Location: [facility];  Service: Endoscopy;  Laterality: N/A;  . HEMORRHOID SURGERY    . HERNIA REPAIR     inguinal x5  . KNEE SURGERY  arthroscopic   left  . PROSTATE CRYOABLATION     2000  . SHOULDER SURGERY  few years ago   left  .  TRANSURETHRAL RESECTION OF PROSTATE N/A 03/19/2015   Procedure: TRANSURETHRAL RESECTION OF THE PROSTATE (TURP);  Surgeon: Carolan Clines, MD;  Location: [facility];  Service: Urology;  Laterality: N/A;    Social History:  Ambulatory with occasional walker        reports that he quit smoking about 65 years ago. His smoking use included cigarettes. He has never used smokeless tobacco. He reports current alcohol use. He reports that he does not use drugs.     Family History:   Family History  Problem Relation Age of Onset  . Breast cancer Mother   . Heart disease Father   . Heart attack Father   . Lymphoma Brother   . Stroke Neg Hx   . Hypertension Neg Hx     Allergies: Allergies  Allergen Reactions  . Codeine Other (See Comments)    Patient was "all over the place"- undesired side effect  . Sulfasalazine Other (See Comments)    Reaction not recalled  . Sulfonamide Derivatives Other (See Comments)    Reaction not recalled     Prior to Admission medications   Medication Sig Start Date End Date Taking? Authorizing Provider  acetaminophen (TYLENOL) 500 MG tablet Take 1,000 mg by mouth daily after breakfast.   Yes [provider]  clopidogrel (PLAVIX) 75 MG tablet TAKE 1 TABLET BY MOUTH  DAILY Patient taking differently: Take 75 mg by mouth daily.  10/05/18  Yes Burchette, Alinda Sierras, MD  furosemide (LASIX) 20 MG tablet TAKE 1 TABLET BY MOUTH  DAILY AS NEEDED FOR FLUID  OR EDEMA. Patient taking differently: Take 20-40 mg by mouth daily as needed (  for fluid retention in the legs).  07/02/18  Yes Hilty, Nadean Corwin, MD  latanoprost (XALATAN) 0.005 % ophthalmic solution Place 1 drop into both eyes at bedtime.  02/08/18  Yes [provider]  levothyroxine (SYNTHROID, LEVOTHROID) 50 MCG tablet TAKE 1 TABLET BY MOUTH  DAILY Patient taking differently: Take 50 mcg by mouth daily before breakfast.  12/11/18  Yes Burchette, Alinda Sierras, MD  metoprolol succinate (TOPROL-XL) 25 MG  24 hr tablet TAKE 1 TABLET BY MOUTH AT  BEDTIME Patient taking differently: Take 25 mg by mouth at bedtime.  11/30/18  Yes Burchette, Alinda Sierras, MD  Multiple Vitamin (MULTIVITAMIN WITH MINERALS) TABS tablet Take 1 tablet by mouth 2 (two) times a week.    Yes [provider]  nitroGLYCERIN (NITROSTAT) 0.4 MG SL tablet Place 0.4 mg under the tongue every 5 (five) minutes as needed (for Esophageal spasms).    Yes [provider]  pantoprazole (PROTONIX) 40 MG tablet TAKE 1 TABLET BY MOUTH  DAILY Patient taking differently: Take 40 mg by mouth daily before breakfast.  11/06/18  Yes Burchette, Alinda Sierras, MD  albuterol (PROVENTIL HFA;VENTOLIN HFA) 108 (90 Base) MCG/ACT inhaler Inhale 2 puffs into the lungs every 4 (four) hours as needed for wheezing or shortness of breath. Patient not taking: Reported on 12/31/2018 11/02/18   Laurey Morale, MD   Physical Exam: Blood pressure (!) 139/96, pulse 86, temperature 98 F (36.7 C), temperature source Oral, resp. rate 18, height 5' 2"  (1.575 m), weight 65.8 kg, SpO2 98 %. 1. General:  in No  Acute distress   Chronically ill   -appearing 2. Psychological: Alert and   Oriented to self and situation 3. Head/ENT:     Dry Mucous Membranes                          Head Non traumatic, neck supple                            Poor Dentition 4. SKIN: normal   Skin turgor,  Skin clean Dry and intact no rash 5. Heart: Regular rate and rhythm no  Murmur, no Rub or gallop 6. Lungs:  no wheezes or crackles   7. Abdomen: Soft,  non-tender, Non distended   obese  bowel sounds present 8. Lower extremities: no clubbing, cyanosis, 1+edema 9. Neurologically   strength 5 out of 5 in all 4 extremities cranial nerves II through XII intact 10. MSK: Normal range of motion   LABS:     Recent Labs  Lab 12/31/18 1227  WBC 5.9  HGB 15.8  HCT 48.3  MCV 98.4  PLT 381   Basic Metabolic Panel: Recent Labs  Lab 12/31/18 1227  NA 141  K 4.9  CL 105  CO2 27    GLUCOSE 156*  BUN 16  CREATININE 1.56*  CALCIUM 9.7      No results for input(s): AST, ALT, ALKPHOS, BILITOT, PROT, ALBUMIN in the last 168 hours. No results for input(s): LIPASE, AMYLASE in the last 168 hours. No results for input(s): AMMONIA in the last 168 hours.    HbA1C: No results for input(s): HGBA1C in the last 72 hours. CBG: Recent Labs  Lab 12/31/18 1320  GLUCAP 154*      Urine analysis:    Component Value Date/Time   COLORURINE YELLOW 12/31/2018 [place] 12/31/2018 1227   LABSPEC 1.009  12/31/2018 1227   PHURINE 7.0 12/31/2018 1227   GLUCOSEU NEGATIVE 12/31/2018 [place] 12/31/2018 [place] 12/31/2018 1227   [place] 12/31/2018 1227   PROTEINUR NEGATIVE 12/31/2018 1227   UROBILINOGEN 0.2 08/27/2015 1930   NITRITE NEGATIVE 12/31/2018 1227   LEUKOCYTESUR NEGATIVE 12/31/2018 1227       Cultures: No results found for: SDES, SPECREQUEST, CULT, REPTSTATUS   Radiological Exams on Admission: Mr [name] GG Contrast  Result Date: 12/31/2018 CLINICAL DATA:  Initial evaluation for acute intermittent dizziness for 3 days. EXAM: MRI HEAD WITHOUT CONTRAST MRA HEAD WITHOUT CONTRAST MRA NECK WITHOUT CONTRAST TECHNIQUE: Multiplanar, multiecho pulse sequences of the brain and surrounding structures were obtained without intravenous contrast. Angiographic images of the Circle of Willis were obtained using MRA technique without intravenous contrast. Angiographic images of the neck were obtained using MRA technique without intravenous contrast. Carotid stenosis measurements (when applicable) are obtained utilizing NASCET criteria, using the distal internal carotid diameter as the denominator. COMPARISON:  None. FINDINGS: MRI HEAD FINDINGS Brain: Advanced age-related cerebral atrophy. Minimal T2/FLAIR hyperintensity within the periventricular white matter, most likely related to chronic microvascular ischemic disease, felt  to be within normal limits for age. 6 mm focus of diffusion abnormality at the left occipital lobe compatible with a small acute ischemic infarct (series 3, image 18). No associated hemorrhage or mass effect. No other evidence for acute or subacute ischemia. Gray-white matter differentiation otherwise maintained. No encephalomalacia to suggest chronic cortical infarction. No foci of susceptibility artifact to suggest acute or chronic intracranial hemorrhage. No mass lesion, midline shift or mass effect. Diffuse ventricular prominence related to global parenchymal volume loss without hydrocephalus. No extra-axial fluid collection. Pituitary gland normal. Vascular: Major intracranial vascular flow voids maintained. Skull and upper cervical spine: Prominent degenerative thickening at the tectorial membrane with resultant severe stenosis at the craniocervical junction and flattening of the right greater than left upper cervical spinal cord (series 13, image 6). No visible cord signal changes on this exam. Remainder the visualized cervical spine demonstrates no acute finding. Bone marrow signal intensity within normal limits. No scalp soft tissue abnormality. Sinuses/Orbits: Patient status post bilateral ocular lens replacement. Mild scattered mucoperiosteal thickening throughout the paranasal sinuses without evidence for acute sinusitis. No mastoid effusion. Inner ear structures normal. Other: None. MRA HEAD FINDINGS ANTERIOR CIRCULATION: Distal cervical segments of the internal carotid arteries are patent with symmetric antegrade flow. Petrous segments widely patent bilaterally. Cavernous segments widely patent bilaterally. Moderate narrowing at the supraclinoid segments bilaterally, right slightly greater than left. ICA termini perfused. A1 segment patent. Right A1 not visualized, which could be either hypoplastic and/or absent. Grossly normal anterior communicating artery. Anterior cerebral arteries mildly irregular  but patent to their distal aspects without significant stenosis. Mild atheromatous irregularity within the right M1 segment without high-grade stenosis. Normal right MCA bifurcation. Distal right MCA branches well perfused. On the left, there is a severe near occlusive stenosis involving the proximal-mid left M1 segment, measuring approximately 7 mm in length (series 404, image 11). Left M1 narrowed but patent distally. Severe stenosis at the proximal of the left M2 inferior division. Left MCA branches otherwise perfused distally. POSTERIOR CIRCULATION: Left vertebral artery mildly irregular but patent to the vertebrobasilar junction without high-grade stenosis. Patent left PICA. Right vertebral artery occluded at the skull base. Some retrograde filling into the distal right V4 segment across the vertebrobasilar junction. Right PICA not visualized. Moderate to severe segmental stenoses involving the proximal and  distal basilar artery (series 406, image 11). Superior cerebral arteries grossly patent bilaterally. Both of the PCAs primarily supplied via the basilar. Additional multifocal severe segmental stenoses involving the proximal and mid P2 segments seen bilaterally. PCAs are perfused to their distal aspects. MRA NECK FINDINGS Examination technically limited by lack of IV contrast. Partially visualized aortic arch within normal limits for caliber with normal 3 vessel morphology. No appreciable flow-limiting stenosis about the origin of the great vessels. Partially visualized subclavian arteries patent bilaterally. Right CCA patent from its origin to the bifurcation without obvious flow-limiting stenosis. Atheromatous plaque about the right bifurcation with associated stenosis of approximately 50-60% by NASCET criteria. Right ICA otherwise patent distally. Left CCA tortuous proximally but grossly patent to the bifurcation without appreciable flow-limiting stenosis. No significant atheromatous narrowing about the  left bifurcation. Left ICA mildly tortuous but otherwise patent. Both of the vertebral arteries arise from the subclavian arteries. Left vertebral artery dominant and widely patent within the neck. Right vertebral artery diminutive with scant irregular flow within the right V2 segment. Right vertebral otherwise occluded within the neck. IMPRESSION: MRI HEAD IMPRESSION: 1. 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 2. Pronounced degenerative thickening at the tectorial membrane with resultant severe spinal stenosis at the cranial cervical junction and impingement upon the upper cervical spinal cord. No definite cord signal changes evident on this exam. 3. Underlying age-related cerebral atrophy. No other acute intracranial abnormality identified. MRA HEAD IMPRESSION: 1. 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 Dominant left vertebral artery widely patent to the vertebrobasilar junction. 2. 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 3. Severe near occlusive stenosis involving the proximal-mid left M1 segment, with additional downstream proximal left M2 stenosis. 4. Moderate diffuse atheromatous narrowing involving the supraclinoid ICAs bilaterally. MRA NECK IMPRESSION: 1. 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 Dominant left vertebral artery widely patent with antegrade flow. 2. 50-60% atheromatous stenosis at the right carotid bifurcation/origin of the right ICA. Right carotid artery system otherwise patent. 3. No hemodynamically significant stenosis within the left carotid artery system within the neck. Electronically Signed   By: Jeannine Boga M.D.   On: 12/31/2018 18:39   Mr Jodene Nam Neck Wo Contrast  Result Date: 12/31/2018 CLINICAL DATA:  Initial evaluation for acute intermittent dizziness for 3 days. EXAM: MRI HEAD WITHOUT CONTRAST MRA  HEAD WITHOUT CONTRAST MRA NECK WITHOUT CONTRAST TECHNIQUE: Multiplanar, multiecho pulse sequences of the brain and surrounding structures were obtained without intravenous contrast. Angiographic images of the Circle of Willis were obtained using MRA technique without intravenous contrast. Angiographic images of the neck were obtained using MRA technique without intravenous contrast. Carotid stenosis measurements (when applicable) are obtained utilizing NASCET criteria, using the distal internal carotid diameter as the denominator. COMPARISON:  None. FINDINGS: MRI HEAD FINDINGS Brain: Advanced age-related cerebral atrophy. Minimal T2/FLAIR hyperintensity within the periventricular white matter, most likely related to chronic microvascular ischemic disease, felt to be within normal limits for age. 6 mm focus of diffusion abnormality at the left occipital lobe compatible with a small acute ischemic infarct (series 3, image 18). No associated hemorrhage or mass effect. No other evidence for acute or subacute ischemia. Gray-white matter differentiation otherwise maintained. No encephalomalacia to suggest chronic cortical infarction. No foci of susceptibility artifact to suggest acute or chronic intracranial hemorrhage. No mass lesion, midline shift or mass effect. Diffuse ventricular prominence related to global  parenchymal volume loss without hydrocephalus. No extra-axial fluid collection. Pituitary gland normal. Vascular: Major intracranial vascular flow voids maintained. Skull and upper cervical spine: Prominent degenerative thickening at the tectorial membrane with resultant severe stenosis at the craniocervical junction and flattening of the right greater than left upper cervical spinal cord (series 13, image 6). No visible cord signal changes on this exam. Remainder the visualized cervical spine demonstrates no acute finding. Bone marrow signal intensity within normal limits. No scalp soft tissue abnormality.  Sinuses/Orbits: Patient status post bilateral ocular lens replacement. Mild scattered mucoperiosteal thickening throughout the paranasal sinuses without evidence for acute sinusitis. No mastoid effusion. Inner ear structures normal. Other: None. MRA HEAD FINDINGS ANTERIOR CIRCULATION: Distal cervical segments of the internal carotid arteries are patent with symmetric antegrade flow. Petrous segments widely patent bilaterally. Cavernous segments widely patent bilaterally. Moderate narrowing at the supraclinoid segments bilaterally, right slightly greater than left. ICA termini perfused. A1 segment patent. Right A1 not visualized, which could be either hypoplastic and/or absent. Grossly normal anterior communicating artery. Anterior cerebral arteries mildly irregular but patent to their distal aspects without significant stenosis. Mild atheromatous irregularity within the right M1 segment without high-grade stenosis. Normal right MCA bifurcation. Distal right MCA branches well perfused. On the left, there is a severe near occlusive stenosis involving the proximal-mid left M1 segment, measuring approximately 7 mm in length (series 404, image 11). Left M1 narrowed but patent distally. Severe stenosis at the proximal of the left M2 inferior division. Left MCA branches otherwise perfused distally. POSTERIOR CIRCULATION: Left vertebral artery mildly irregular but patent to the vertebrobasilar junction without high-grade stenosis. Patent left PICA. Right vertebral artery occluded at the skull base. Some retrograde filling into the distal right V4 segment across the vertebrobasilar junction. Right PICA not visualized. Moderate to severe segmental stenoses involving the proximal and distal basilar artery (series 406, image 11). Superior cerebral arteries grossly patent bilaterally. Both of the PCAs primarily supplied via the basilar. Additional multifocal severe segmental stenoses involving the proximal and mid P2 segments  seen bilaterally. PCAs are perfused to their distal aspects. MRA NECK FINDINGS Examination technically limited by lack of IV contrast. Partially visualized aortic arch within normal limits for caliber with normal 3 vessel morphology. No appreciable flow-limiting stenosis about the origin of the great vessels. Partially visualized subclavian arteries patent bilaterally. Right CCA patent from its origin to the bifurcation without obvious flow-limiting stenosis. Atheromatous plaque about the right bifurcation with associated stenosis of approximately 50-60% by NASCET criteria. Right ICA otherwise patent distally. Left CCA tortuous proximally but grossly patent to the bifurcation without appreciable flow-limiting stenosis. No significant atheromatous narrowing about the left bifurcation. Left ICA mildly tortuous but otherwise patent. Both of the vertebral arteries arise from the subclavian arteries. Left vertebral artery dominant and widely patent within the neck. Right vertebral artery diminutive with scant irregular flow within the right V2 segment. Right vertebral otherwise occluded within the neck. IMPRESSION: MRI HEAD IMPRESSION: 1. 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 2. Pronounced degenerative thickening at the tectorial membrane with resultant severe spinal stenosis at the cranial cervical junction and impingement upon the upper cervical spinal cord. No definite cord signal changes evident on this exam. 3. Underlying age-related cerebral atrophy. No other acute intracranial abnormality identified. MRA HEAD IMPRESSION: 1. 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 Dominant left vertebral artery widely patent to the vertebrobasilar junction. 2. Multifocal moderate to severe segmental stenoses involving  the basilar artery and bilateral P2 segments as above. 3. Severe near occlusive stenosis involving the proximal-mid left M1  segment, with additional downstream proximal left M2 stenosis. 4. Moderate diffuse atheromatous narrowing involving the supraclinoid ICAs bilaterally. MRA NECK IMPRESSION: 1. 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 Dominant left vertebral artery widely patent with antegrade flow. 2. 50-60% atheromatous stenosis at the right carotid bifurcation/origin of the right ICA. Right carotid artery system otherwise patent. 3. No hemodynamically significant stenosis within the left carotid artery system within the neck. Electronically Signed   By: Jeannine Boga M.D.   On: 12/31/2018 18:39   Mr Brain Wo Contrast  Result Date: 12/31/2018 CLINICAL DATA:  Initial evaluation for acute intermittent dizziness for 3 days. EXAM: MRI HEAD WITHOUT CONTRAST MRA HEAD WITHOUT CONTRAST MRA NECK WITHOUT CONTRAST TECHNIQUE: Multiplanar, multiecho pulse sequences of the brain and surrounding structures were obtained without intravenous contrast. Angiographic images of the Circle of Willis were obtained using MRA technique without intravenous contrast. Angiographic images of the neck were obtained using MRA technique without intravenous contrast. Carotid stenosis measurements (when applicable) are obtained utilizing NASCET criteria, using the distal internal carotid diameter as the denominator. COMPARISON:  None. FINDINGS: MRI HEAD FINDINGS Brain: Advanced age-related cerebral atrophy. Minimal T2/FLAIR hyperintensity within the periventricular white matter, most likely related to chronic microvascular ischemic disease, felt to be within normal limits for age. 6 mm focus of diffusion abnormality at the left occipital lobe compatible with a small acute ischemic infarct (series 3, image 18). No associated hemorrhage or mass effect. No other evidence for acute or subacute ischemia. Gray-white matter differentiation otherwise maintained. No encephalomalacia to suggest chronic cortical infarction. No foci of susceptibility  artifact to suggest acute or chronic intracranial hemorrhage. No mass lesion, midline shift or mass effect. Diffuse ventricular prominence related to global parenchymal volume loss without hydrocephalus. No extra-axial fluid collection. Pituitary gland normal. Vascular: Major intracranial vascular flow voids maintained. Skull and upper cervical spine: Prominent degenerative thickening at the tectorial membrane with resultant severe stenosis at the craniocervical junction and flattening of the right greater than left upper cervical spinal cord (series 13, image 6). No visible cord signal changes on this exam. Remainder the visualized cervical spine demonstrates no acute finding. Bone marrow signal intensity within normal limits. No scalp soft tissue abnormality. Sinuses/Orbits: Patient status post bilateral ocular lens replacement. Mild scattered mucoperiosteal thickening throughout the paranasal sinuses without evidence for acute sinusitis. No mastoid effusion. Inner ear structures normal. Other: None. MRA HEAD FINDINGS ANTERIOR CIRCULATION: Distal cervical segments of the internal carotid arteries are patent with symmetric antegrade flow. Petrous segments widely patent bilaterally. Cavernous segments widely patent bilaterally. Moderate narrowing at the supraclinoid segments bilaterally, right slightly greater than left. ICA termini perfused. A1 segment patent. Right A1 not visualized, which could be either hypoplastic and/or absent. Grossly normal anterior communicating artery. Anterior cerebral arteries mildly irregular but patent to their distal aspects without significant stenosis. Mild atheromatous irregularity within the right M1 segment without high-grade stenosis. Normal right MCA bifurcation. Distal right MCA branches well perfused. On the left, there is a severe near occlusive stenosis involving the proximal-mid left M1 segment, measuring approximately 7 mm in length (series 404, image 11). Left M1  narrowed but patent distally. Severe stenosis at the proximal of the left M2 inferior division. Left MCA branches otherwise perfused distally. POSTERIOR CIRCULATION: Left vertebral artery mildly irregular but patent to the vertebrobasilar junction without high-grade stenosis. Patent left PICA. Right vertebral artery occluded  at the skull base. Some retrograde filling into the distal right V4 segment across the vertebrobasilar junction. Right PICA not visualized. Moderate to severe segmental stenoses involving the proximal and distal basilar artery (series 406, image 11). Superior cerebral arteries grossly patent bilaterally. Both of the PCAs primarily supplied via the basilar. Additional multifocal severe segmental stenoses involving the proximal and mid P2 segments seen bilaterally. PCAs are perfused to their distal aspects. MRA NECK FINDINGS Examination technically limited by lack of IV contrast. Partially visualized aortic arch within normal limits for caliber with normal 3 vessel morphology. No appreciable flow-limiting stenosis about the origin of the great vessels. Partially visualized subclavian arteries patent bilaterally. Right CCA patent from its origin to the bifurcation without obvious flow-limiting stenosis. Atheromatous plaque about the right bifurcation with associated stenosis of approximately 50-60% by NASCET criteria. Right ICA otherwise patent distally. Left CCA tortuous proximally but grossly patent to the bifurcation without appreciable flow-limiting stenosis. No significant atheromatous narrowing about the left bifurcation. Left ICA mildly tortuous but otherwise patent. Both of the vertebral arteries arise from the subclavian arteries. Left vertebral artery dominant and widely patent within the neck. Right vertebral artery diminutive with scant irregular flow within the right V2 segment. Right vertebral otherwise occluded within the neck. IMPRESSION: MRI HEAD IMPRESSION: 1. 6 mm acute ischemic  nonhemorrhagic infarct involving the left occipital lobe. 2. Pronounced degenerative thickening at the tectorial membrane with resultant severe spinal stenosis at the cranial cervical junction and impingement upon the upper cervical spinal cord. No definite cord signal changes evident on this exam. 3. Underlying age-related cerebral atrophy. No other acute intracranial abnormality identified. MRA HEAD IMPRESSION: 1. 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 Dominant left vertebral artery widely patent to the vertebrobasilar junction. 2. 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 3. Severe near occlusive stenosis involving the proximal-mid left M1 segment, with additional downstream proximal left M2 stenosis. 4. Moderate diffuse atheromatous narrowing involving the supraclinoid ICAs bilaterally. MRA NECK IMPRESSION: 1. 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 Dominant left vertebral artery widely patent with antegrade flow. 2. 50-60% atheromatous stenosis at the right carotid bifurcation/origin of the right ICA. Right carotid artery system otherwise patent. 3. No hemodynamically significant stenosis within the left carotid artery system within the neck. Electronically Signed   By: Jeannine Boga M.D.   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 CVA (cerebral vascular accident) ([place]) -  - will admit based on TIA/CVA protocol,        Monitor on Tele       MRA/MRI Resulted - showing acute ischemic CVA 6 mm acute ischemic nonhemorrhagic infarct involving the left occipital lobe               Echo to evaluate for possible embolic source,        obtain cardiac  enzymes,  ECG,   Lipid panel, TSH.        Order PT/OT evaluation.        Deep nothing by mouth until passes swallow eval         Will make sure patient is on antiplatelet ASA 325  Plavix agent and statin  Allow permissive Hypertension keep BP <220/120        Neurology consulted Have seen pt in ER    . Dyslipidemia-  stable make sure   patient is on statin . GERD . Hypothyroidism -continue Synthroid check TSH . Essential hypertension -allow permissive hypertension for tonight . CKD (chronic kidney disease) stage 3, GFR 30-59 ml/min (HCC) -avoid nephrotoxic medications currently at baseline  chronic diastolic CHF currently with peripheral edema fluid up will hold off any IV fluids may need further gentle diuresis will initiate tomorrow if able to tolerate Other plan as per orders.  DVT prophylaxis:  SCD   Code Status:  FULL CODE   as per patient    I had personally discussed CODE STATUS with patient   Family Communication:   Family  at  Bedside  plan of care was discussed with   Son, Daughter in Sports coach,   Disposition Plan:     To home once workup is complete and patient is stable                     Would benefit from PT/OT eval prior to DC  Ordered                                                        Consults called: NEurology seen by Dr. Rory Percy in ER  Admission status:    Obs    Level of care     tele   indefinitely         Toy Baker 12/31/2018, 8:18 PM    [facility]     after 2 AM please page floor coverage PA If 7AM-7PM, please contact the day team taking care of the patient using [email or web address]

## 2018-12-31 NOTE — Telephone Encounter (Signed)
Please see messages.

## 2018-12-31 NOTE — Telephone Encounter (Signed)
I just now saw this message.  Sounds like they are taking him to hospital.  Would confirm.

## 2018-12-31 NOTE — ED Triage Notes (Signed)
Pt in from home c/o dizziness with fall today while in the bathroom, pt denies LOC, pt denies hitting head, dizziness started onset x 3 days intermittently, pt takes Plavix, pt denies pain, pt c/o R knee pan and R hand pain, no obvious deformtiy, A&O x4

## 2018-12-31 NOTE — ED Notes (Signed)
Ordered [facility] [employer] Public affairs consultant

## 2018-12-31 NOTE — Telephone Encounter (Signed)
Agree 

## 2018-12-31 NOTE — ED Notes (Signed)
Admitting at bedside 

## 2018-12-31 NOTE — ED Notes (Signed)
Unable to get O2 in the lobby.

## 2018-12-31 NOTE — Telephone Encounter (Addendum)
Neighbor [name] Smothers calling to advise they are going to take pt to the hospital to be evaluated. They cannot get him to drink, he is weak, and they don't think he should wait.

## 2018-12-31 NOTE — ED Notes (Signed)
Pt ready for transport up stairs. Tele in place.

## 2018-12-31 NOTE — Consult Note (Addendum)
Neurology Consultation Reason for Consult: Dizziness Referring Physician: Long CC: Dizziness with ? TIA  History is obtained from: Chart and patient  HPI: Robert Rowe is a 85 y.o. male with history of TIA, SOB, palpitations, hyperlipidemia, hypothyroidism, PVC's, CHF. Patient has been experiencing vertigo and dizziness for the past 2-3 days and blood pressures in the 100/70 with HR in the 60,s. Due to fall today he was brought to hospital. On arrival his BP was significantly high 191/121,201/97, 178/91.  Patient has been seen by Dr Delice Lesch 6 months ago for episodes of passing out "as well as bilateral leg weakness with slurred speech. MRA head and neck showed intracranial stenosis, with significant intracranial stenosis in basilar, right ICA cavernous/supraclinoid junction, moderate narrowing in bilateral ACA, right ACA, distal right vertebral, bilateral PCA stenosis. Recurrent symptoms concerning for hypoperfusion causing recurrent TIAs. He is taking Plavix daily. He denies any further episodes of unresponsiveness or leg weakness."  After MRI I was able to discuss with the patient his symptoms.  He states that the majority of his dizziness occurs when he goes from lying down to standing up early in the morning.  He states that he feels dizzy and last for couple seconds to a minute and then goes away.  Today was different.  He was walking to the bathroom and suddenly he just fell to the ground, defecated and urinated on himself.  He does recall where he was and what was going on around him but is unsure if he possibly lost consciousness.  He states that has never happened to him before.  Currently he feels fine.  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   Past Medical History:  Diagnosis Date  . BENIGN PROSTATIC HYPERTROPHY 04/05/2007  . 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 CHEST DISCOMFORT 04/25/2009  . Chronic diastolic CHF (congestive heart failure) ([place])    a. Echo 12/16: mild LVH, EF 55-60%, no RWMA, Gr 1 DD, mild AI, MAC, mild LAE, normal RVF, PASP 37 mmHg  . Chronic kidney disease    chronic   . DJD (degenerative joint disease)   . Dysrhythmia    pvc's   . GERD 12/03/2007  . Hiatal hernia   . History of nuclear stress test    Myoview 1/17: EF 53%, normal perfusion; Low Risk  . HYPERLIPIDEMIA 04/05/2007  . Hypothyroidism   . Osteoarth NOS-Unspec 04/05/2007  . Palpitations 02/12/2010  . PEDAL EDEMA 05/01/2008  . Rheumatoid arthritis(714.0) 04/05/2007  . Shortness of breath dyspnea   . TRANSIENT ISCHEMIC ATTACK, HX OF 04/05/2007   ?    Family History  Problem Relation Age of Onset  . Breast cancer Mother   . Heart disease Father   . Heart attack Father   . Lymphoma Brother   . Stroke Neg Hx   . Hypertension Neg Hx    Social History:   reports that he quit smoking about 65 years ago. His smoking use included cigarettes. He has never used smokeless tobacco. He reports current alcohol use. He reports that he does not use drugs.  Medications  Current Facility-Administered Medications:  .  stroke: mapping our early stages of recovery book, , Does not apply, Once, Amie Portland, MD .  aspirin suppository 300 mg, 300 mg, Rectal, Daily **OR** aspirin tablet 325 mg, 325 mg, Oral, Daily, Amie Portland, MD .  Derrill Memo ON 01/01/2019] atorvastatin (LIPITOR) tablet 80 mg, 80 mg, Oral, q1800, Amie Portland, MD .  clopidogrel (PLAVIX) tablet 75 mg, 75 mg, Oral, Daily, Amie Portland, MD  Current Outpatient Medications:  .  acetaminophen (TYLENOL) 500 MG tablet, Take 1,000 mg by mouth daily after breakfast., Disp: , Rfl:  .  clopidogrel (PLAVIX) 75 MG tablet, TAKE 1 TABLET BY MOUTH  DAILY  (Patient taking differently: Take 75 mg by mouth daily. ), Disp: 90 tablet, Rfl: 1 .  furosemide (LASIX) 20 MG tablet, TAKE 1 TABLET BY MOUTH  DAILY AS NEEDED FOR FLUID  OR EDEMA. (Patient taking differently: Take 20-40 mg by mouth daily as needed (for fluid retention in the legs). ), Disp: 90 tablet, Rfl: 3 .  latanoprost (XALATAN) 0.005 % ophthalmic solution, Place 1 drop into both eyes at bedtime. , Disp: , Rfl: 99 .  levothyroxine (SYNTHROID, LEVOTHROID) 50 MCG tablet, TAKE 1 TABLET BY MOUTH  DAILY (Patient taking differently: Take 50 mcg by mouth daily before breakfast. ), Disp: 90 tablet, Rfl: 2 .  metoprolol succinate (TOPROL-XL) 25 MG 24 hr tablet, TAKE 1 TABLET BY MOUTH AT  BEDTIME (Patient taking differently: Take 25 mg by mouth at bedtime. ), Disp: 90 tablet, Rfl: 1 .  Multiple Vitamin (MULTIVITAMIN WITH MINERALS) TABS tablet, Take 1 tablet by mouth 2 (two) times a week. , Disp: , Rfl:  .  nitroGLYCERIN (NITROSTAT) 0.4 MG SL tablet, Place 0.4 mg under the tongue every 5 (five) minutes as needed (for Esophageal spasms). , Disp: , Rfl:  .  pantoprazole (PROTONIX) 40 MG tablet, TAKE 1 TABLET BY MOUTH  DAILY (Patient taking differently: Take 40 mg by mouth daily before breakfast. ), Disp: 90 tablet, Rfl: 1 .  albuterol (PROVENTIL HFA;VENTOLIN HFA) 108 (90 Base) MCG/ACT inhaler, Inhale 2 puffs into the lungs every 4 (four) hours as needed for wheezing or shortness of breath. (Patient not taking: Reported on 12/31/2018), Disp: 1 Inhaler, Rfl: 0   Exam: Current vital signs: BP (!) 143/94   Pulse 86   Temp 98 F (36.7 C) (Oral)   Resp (!) 24   Ht _0  (1.575 m)   Wt 65.8 kg   SpO2 96%   BMI 26.52 kg/m  Vital signs in last 24 hours: Temp:  [98 F (36.7 C)] 98 F (36.7 C) (02/10 1223) Pulse Rate:  [76-86] 86 (02/10 1500) Resp:  [16-24] 24 (02/10 1515) BP: (143-201)/(72-121) 143/94 (02/10 1515) SpO2:  [93 %-100 %] 96 % (02/10 1500) Weight:  [65.8 kg] 65.8 kg (02/10 1223)  Physical  Exam  Constitutional: Appears well-developed and well-nourished.  Psych: Affect appropriate to situation Eyes: No scleral injection HENT: No OP obstrucion Head: Normocephalic.  Cardiovascular: Normal rate and regular rhythm.  Respiratory: Effort normal, non-labored breathing GI: Soft.  No distension. There is no tenderness.  Skin: WDI Neuro: Mental Status: Patient is awake, alert, oriented to person, place, month, year, and situation. Patient is able to give a clear and coherent history.  However he does seem to perseverate on how long he was in the MRI No signs of aphasia or neglect Cranial Nerves: II: Visual Fields are full.  III,IV, VI: EOMI without ptosis or diploplia.  Right pupil , round, and reactive to light.  Left pupil is approximately 5 mm, ireregular (was hit by a golf ball) and not reactive to light V: Facial sensation is symmetric to temperature VII: Facial movement is symmetric.  VIII: hearing is intact to voice X: Uvula elevates symmetrically XI: Shoulder shrug is symmetric. XII: tongue is midline without atrophy or fasciculations.  Motor: Tone is normal. Bulk is normal. 5/5 strength was present in all four extremities. Sensory: Sensation is symmetric to light touch and temperature in the arms and legs. Deep Tendon Reflexes: 1+ in bilateral upper extremities with no knee jerk or ankle jerk Plantars: Toes are downgoing bilaterally.  Cerebellar: FNF and HKS are intact bilaterally  Labs I have reviewed labs in epic and the results pertinent to this consultation are:   CBC    Component Value Date/Time   WBC 5.9 12/31/2018 1227   RBC 4.91 12/31/2018 1227   HGB 15.8 12/31/2018 1227   HCT 48.3 12/31/2018 1227   PLT 269 12/31/2018 1227   MCV 98.4 12/31/2018 1227   MCH 32.2 12/31/2018 1227   MCHC 32.7 12/31/2018 1227   RDW 13.2 12/31/2018 1227   LYMPHSABS 2.4 11/10/2017 1816   MONOABS 0.7 11/10/2017 1816   EOSABS 0.1 11/10/2017 1816   BASOSABS 0.0  11/10/2017 1816    CMP     Component Value Date/Time   NA 141 12/31/2018 1227   K 4.9 12/31/2018 1227   CL 105 12/31/2018 1227   CO2 27 12/31/2018 1227   GLUCOSE 156 (H) 12/31/2018 1227   BUN 16 12/31/2018 1227   CREATININE 1.56 (H) 12/31/2018 1227   CREATININE 1.64 (H) 12/09/2015 1520   CALCIUM 9.7 12/31/2018 1227   PROT 6.1 (L) 05/15/2018 0436   ALBUMIN 3.6 05/15/2018 0436   AST 60 (H) 05/15/2018 0436   ALT 43 05/15/2018 0436   ALKPHOS 67 05/15/2018 0436   BILITOT 1.0 05/15/2018 0436   GFRNONAA 36 (L) 12/31/2018 1227   GFRAA 42 (L) 12/31/2018 1227    Lipid Panel     Component Value Date/Time   CHOL 255 (H) 03/24/2016 1328   TRIG 327.0 (H) 03/24/2016 1328   HDL 37.90 (L) 03/24/2016 1328   CHOLHDL 7 03/24/2016 1328   VLDL 65.4 (H) 03/24/2016 1328   LDLCALC 116 (H) 11/15/2015 0421   LDLDIRECT 175.0 03/24/2016 1328     Imaging I have reviewed the images obtained:    MRI examination of the brain/MRA of head and neck-awaiting formal reading- patient does show a extremely small punctate infarct in the left occipital lobe and in addition he shows significant atherosclerosis throughout head with a left dominant vertebral artery.  Neck does not show any significant atherosclerosis.  Etta Quill PA-C [facility] [phone]  M-F  (9:00 am- 5:00 PM)  12/31/2018, 4:37 PM   Attending Neurohospitalist Addendum Patient seen and examined with APP/Resident. Agree with the history and physical as documented above.  Assessment:  85 year old male with what sounds like multiple orthostatic episodes from getting out of bed however this episode was different secondary to he suddenly fell to the floor and had no prodrome.  He does recall what was going on around him afterwards however he did have fecal and urinary incontinence.   MRA reviewed however awaiting final reading, however does show significant atherosclerosis throughout the head.  In addition he has a  significantly small punctate infarct in the left occipital lobe, question if this is of much clinical significance but with the stenotic and atherosclerotic posterior circulation - warrants DAPT.  No  clear sz history but some incontinence on falling to floor - ?syncopal convulsion or convulsive syncope.   IMP Posterior circulation stroke -  Likely atheroembolic from atherosclerotic posterior circulation. Episode of questionable passing out and bilateral leg weakness-less likely concerning for seizure but if all other work-up remains negative, can consider an EEG at some point.  No need for antiepileptics at this time.  Recommendations: #Transthoracic Echo, #Continue Plavix, add ASA 325. #Start or continue Atorvastatin 80 mg/other high intensity statin # BP goal: permissive HTN upto 220/120 mmHg # HBAIC and Lipid profile # Telemetry monitoring # Frequent neuro checks # NPO until passes stroke swallow screen #Consider EEG if concern for seizure persists, although history is not clear. #Orthostatic blood pressures # please page stroke NP  Or  PA  Or MD from 8am -4 pm  as this patient from this time will be  followed by the stroke.   You can look them up on [email or web address]  Password [email or web address]  I have independently reviewed the chart, obtained history, review of systems and examined the patient.I have personally reviewed pertinent head/neck/spine imaging (CT/MRI). Please feel free to call with any questions. --- Amie Portland, MD [facility] Pager: [phone]  If 7pm to 7am, please call on call as listed on AMION.

## 2018-12-31 NOTE — ED Provider Notes (Signed)
Blood pressure (!) 143/94, pulse 84, temperature 98 F (36.7 C), temperature source Oral, resp. rate 19, height 5\' 2"  (1.575 m), weight 65.8 kg, SpO2 98 %.  Assuming care from Dr. Sherry Ruffing.  In short, Robert Rowe is a 85 y.o. male with a chief complaint of No chief complaint on file. Marland Kitchen  Refer to the original H&P for additional details.  The current plan of care is to f/u MRI and reassess.  Updated family on MRI and discussed with Dr. Malen Gauze. Plan for admit for stroke evaluation.   Discussed patient's case with Hospitalist to request admission. Patient and family (if present) updated with plan. Care transferred to Hospitalist service.  I reviewed all nursing notes, vitals, pertinent old records, EKGs, labs, imaging (as available).       Margette Fast, MD 12/31/18 2042

## 2018-12-31 NOTE — Telephone Encounter (Signed)
Incoming call from Patient and Neighbor who states that the Patient  Has been experiencing vertigo for the past 2 to three days every morning.  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   Patient blood pressure was  100/70 heart rat was 60 .  Patient rates it mod moderate.  Patient denies any other symptoms.  Patient scheduled for an appointment for  Tuesday,  01/01/19 @ 10/25/20am with  Dr.  Elease Hashimoto.  Patient  Voices understanding. Related to Patient. To go Urgent Care.  Patient states he would.  Reason for Disposition . [1] MODERATE dizziness (e.g., vertigo; feels very unsteady, interferes with normal activities) AND [2] has been evaluated by physician for this  Answer Assessment - Initial Assessment Questions 1. DESCRIPTION: "Describe your dizziness."     *No Answer* 2. VERTIGO: "Do you feel like either you or the room is spinning or tilting?"      yes 3. LIGHTHEADED: "Do you feel lightheaded?" (e.g., somewhat faint, woozy, weak upon standing)     *No Answer* 4.  SEVERITY: "How bad is it?"  "Can you walk?"   - MILD - Feels unsteady but walking normally.   - MODERATE - Feels very unsteady when walking, but not falling; interferes with normal activities (e.g., school, work) .   - SEVERE - Unable to walk without falling (requires assistance).     Moderate  5. ONSET:  "When did the dizziness begin?"     44mornings ago 6. AGGRAVATING FACTORS: "Does anything make it worse?" (e.g., standing, change in head position)     no 7. CAUSE: "What do you think is causing the dizziness?"     *No Answer* 8. RECURRENT SYMPTOM: "Have you had dizziness before?" If so, ask: "When was the last time?" "What happened that time?"     *No Answer* 9. OTHER SYMPTOMS: "Do you have any other symptoms?" (e.g., headache, weakness, numbness, vomiting, earache)     denies 10. PREGNANCY: "Is there any chance you are pregnant?" "When was your last menstrual period?"       na  Protocols used: DIZZINESS - VERTIGO-A-AH

## 2018-12-31 NOTE — ED Provider Notes (Signed)
[facility] EMERGENCY DEPARTMENT Provider Note   CSN: [id number] Arrival date & time: 12/31/18  1218     History   Chief Complaint No chief complaint on file.   HPI Robert Rowe is a 85 y.o. male.  The history is provided by the patient and medical records. No language interpreter was used.  Dizziness  Quality:  Head spinning and room spinning Severity:  Severe Onset quality:  Sudden Duration:  3 days Timing:  Intermittent Progression:  Waxing and waning Chronicity:  New Associated symptoms: hearing loss   Associated symptoms: no chest pain, no diarrhea, no headaches, no nausea, no shortness of breath, no vomiting and no weakness     Past Medical History:  Diagnosis Date  . BENIGN PROSTATIC HYPERTROPHY 04/05/2007  . 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 CHEST DISCOMFORT 04/25/2009  . Chronic diastolic CHF (congestive heart failure) ([place])    a. Echo 12/16: mild LVH, EF 55-60%, no RWMA, Gr 1 DD, mild AI, MAC, mild LAE, normal RVF, PASP 37 mmHg  . Chronic kidney disease    chronic   . DJD (degenerative joint disease)   . Dysrhythmia    pvc's   . GERD 12/03/2007  . Hiatal hernia   . History of nuclear stress test    Myoview 1/17: EF 53%, normal perfusion; Low Risk  . HYPERLIPIDEMIA 04/05/2007  . Hypothyroidism   . Osteoarth NOS-Unspec 04/05/2007  . Palpitations 02/12/2010  . PEDAL EDEMA 05/01/2008  . Rheumatoid arthritis(714.0) 04/05/2007  . Shortness of breath dyspnea   . TRANSIENT ISCHEMIC ATTACK, HX OF 04/05/2007   ?     Patient Active Problem List   Diagnosis Date Noted  . Chest pain 05/15/2018  . CKD (chronic kidney disease) stage 3, GFR 30-59 ml/min (HCC) 05/08/2018  . Other fatigue 06/13/2017  . Abnormal diffusion capacity determined by pulmonary function test 06/13/2017  . Chronic diastolic CHF (congestive heart failure) ([place]) 11/01/2016  . Essential hypertension 11/01/2016  . Intracranial vascular stenosis 11/09/2015   . Mild cognitive impairment 11/09/2015  . 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 Benign prostatic hyperplasia with urinary obstruction 03/19/2015  . Awareness alteration, transient 02/26/2015  . PVC's (premature ventricular contractions) 08/22/2013  . Hypothyroidism 05/09/2013  . Stricture and stenosis of esophagus 03/12/2013  . Dysphagia, unspecified(787.20) 02/22/2013  . DOE (dyspnea on exertion) 03/23/2011  . PALPITATIONS 02/12/2010  . CHEST DISCOMFORT 04/25/2009  . PEDAL EDEMA 05/01/2008  . GERD 12/03/2007  . Dyslipidemia 04/05/2007  . BENIGN PROSTATIC HYPERTROPHY 04/05/2007  . Rheumatoid arthritis ([place]) 04/05/2007  . Osteoarthritis 04/05/2007  . History of cardiovascular disorder 04/05/2007    Past Surgical History:  Procedure Laterality Date  . BALLOON DILATION N/A 03/12/2013   Procedure: BALLOON DILATION;  Surgeon: Inda Castle, MD;  Location: [facility];  Service: Endoscopy;  Laterality: N/A;  . CARPAL TUNNEL RELEASE  11/28/2018   Left hand  . CATARACT EXTRACTION  2001   bilateral  . CYSTOSCOPY WITH URETHRAL DILATATION N/A 09/24/2015   Procedure: CYSTOSCOPY WITH COOK BALLOON DILATATION OF URETHRAL STRICTURE ;  Surgeon: Carolan Clines, MD;  Location: [facility];  Service: Urology;  Laterality: N/A;  . CYSTOSCOPY WITH URETHRAL DILATATION N/A 07/20/2016   Procedure: CYSTOSCOPY WITH URETHRAL DILATATION;  Surgeon: Alexis Frock, MD;  Location: [facility];  Service: Urology;  Laterality: N/A;  1 HOUR  . ESOPHAGOGASTRODUODENOSCOPY N/A 03/12/2013   Procedure: ESOPHAGOGASTRODUODENOSCOPY (EGD);  Surgeon: Inda Castle, MD;  Location: [facility];  Service: Endoscopy;  Laterality: N/A;  . HEMORRHOID SURGERY    . HERNIA REPAIR     inguinal x5  . KNEE SURGERY  arthroscopic   left  . PROSTATE CRYOABLATION     2000  . SHOULDER SURGERY  few years ago   left  . TRANSURETHRAL RESECTION OF PROSTATE N/A 03/19/2015   Procedure: TRANSURETHRAL RESECTION OF THE PROSTATE (TURP);   Surgeon: Carolan Clines, MD;  Location: [facility];  Service: Urology;  Laterality: N/A;        Home Medications    Prior to Admission medications   Medication Sig Start Date End Date Taking? Authorizing Provider  acetaminophen (TYLENOL) 325 MG tablet Take 650 mg by mouth every morning.    [provider]  albuterol (PROVENTIL HFA;VENTOLIN HFA) 108 (90 Base) MCG/ACT inhaler Inhale 2 puffs into the lungs every 4 (four) hours as needed for wheezing or shortness of breath. 11/02/18   Laurey Morale, MD  clopidogrel (PLAVIX) 75 MG tablet TAKE 1 TABLET BY MOUTH  DAILY 10/05/18   Burchette, Alinda Sierras, MD  furosemide (LASIX) 20 MG tablet TAKE 1 TABLET BY MOUTH  DAILY AS NEEDED FOR FLUID  OR EDEMA. 07/02/18   Hilty, Nadean Corwin, MD  latanoprost (XALATAN) 0.005 % ophthalmic solution Instill 1 drop into both eyes at bedtime 50/2=25 02/08/18   [provider]  levothyroxine (SYNTHROID, LEVOTHROID) 50 MCG tablet TAKE 1 TABLET BY MOUTH  DAILY 12/11/18   Burchette, Alinda Sierras, MD  metoprolol succinate (TOPROL-XL) 25 MG 24 hr tablet TAKE 1 TABLET BY MOUTH AT  BEDTIME 11/30/18   Burchette, Alinda Sierras, MD  Multiple Vitamin (MULTIVITAMIN WITH MINERALS) TABS tablet Take 1 tablet by mouth 3 (three) times a week.    [provider]  neomycin-polymyxin b-dexamethasone (MAXITROL) 3.5-10000-0.1 SUSP Place 2 drops into both eyes 4 (four) times daily. 10/20/17   [provider]  nitroGLYCERIN (NITROSTAT) 0.4 MG SL tablet Place 0.4 mg under the tongue every 5 (five) minutes as needed (ESOPHEAGEAL SPASPMS). Reported on 03/01/2016    [provider]  pantoprazole (PROTONIX) 40 MG tablet TAKE 1 TABLET BY MOUTH  DAILY 11/06/18   Burchette, Alinda Sierras, MD    Family History Family History  Problem Relation Age of Onset  . Breast cancer Mother   . Heart disease Father   . Heart attack Father   . Lymphoma Brother   . Stroke Neg Hx   . Hypertension Neg Hx     Social History Social History     Tobacco Use  . Smoking status: Former Smoker    Types: Cigarettes    Last attempt to quit: 08/13/1953    Years since quitting: 65.4  . Smokeless tobacco: Never Used  Substance Use Topics  . Alcohol use: Yes    Comment: occ vodka  . Drug use: No     Allergies   Codeine; Sulfasalazine; and Sulfonamide derivatives   Review of Systems Review of Systems  Constitutional: Negative for chills, diaphoresis, fatigue and fever.  HENT: Positive for hearing loss. Negative for congestion, dental problem, ear discharge, ear pain and facial swelling.   Eyes: Negative for photophobia and visual disturbance.  Respiratory: Negative for cough, chest tightness, shortness of breath, wheezing and stridor.   Cardiovascular: Negative for chest pain.  Gastrointestinal: Negative for abdominal pain, diarrhea, nausea and vomiting.  Genitourinary: Negative for dysuria and frequency.  Musculoskeletal: Negative for back pain, neck pain and neck stiffness.  Skin: Negative for rash and wound.  Neurological:  Positive for dizziness. Negative for seizures, syncope, speech difficulty, weakness, light-headedness, numbness and headaches.  Psychiatric/Behavioral: Negative for agitation and confusion.     Physical Exam Updated Vital Signs BP (!) 201/97   Pulse 82   Temp 98 F (36.7 C) (Oral)   Resp (!) 21   Ht _0  (1.575 m)   Wt 65.8 kg   SpO2 98%   BMI 26.52 kg/m   Physical Exam Vitals signs and nursing note reviewed.  Constitutional:      General: He is not in acute distress.    Appearance: He is well-developed. He is not ill-appearing, toxic-appearing or diaphoretic.  HENT:     Head: Normocephalic and atraumatic.     Right Ear: Tympanic membrane normal. There is no impacted cerumen.     Left Ear: Tympanic membrane normal. There is no impacted cerumen.     Nose: Nose normal. No congestion or rhinorrhea.     Mouth/Throat:     Pharynx: No oropharyngeal exudate or posterior oropharyngeal erythema.   Eyes:     Conjunctiva/sclera: Conjunctivae normal.     Pupils: Pupils are equal, round, and reactive to light.  Neck:     Musculoskeletal: Neck supple. No muscular tenderness.  Cardiovascular:     Rate and Rhythm: Normal rate and regular rhythm.     Pulses: Normal pulses.     Heart sounds: No murmur.  Pulmonary:     Effort: Pulmonary effort is normal. No respiratory distress.     Breath sounds: Normal breath sounds. No wheezing, rhonchi or rales.  Chest:     Chest wall: No tenderness.  Abdominal:     Palpations: Abdomen is soft.     Tenderness: There is no abdominal tenderness.  Musculoskeletal:        General: No tenderness.     Right lower leg: No edema.     Left lower leg: No edema.  Skin:    General: Skin is warm and dry.     Capillary Refill: Capillary refill takes less than 2 seconds.  Neurological:     General: No focal deficit present.     Mental Status: He is alert and oriented to person, place, and time.     Sensory: No sensory deficit.     Motor: No weakness.  Psychiatric:        Mood and Affect: Mood normal.      ED Treatments / Results  Labs (all labs ordered are listed, but only abnormal results are displayed) Labs Reviewed  BASIC METABOLIC PANEL - Abnormal; Notable for the following components:      Result Value   Glucose, Bld 156 (*)    Creatinine, Ser 1.56 (*)    GFR calc non Af Amer 36 (*)    GFR calc Af Amer 42 (*)    All other components within normal limits  CBG MONITORING, ED - Abnormal; Notable for the following components:   Glucose-Capillary 154 (*)    All other components within normal limits  URINE CULTURE  CBC  URINALYSIS, ROUTINE W REFLEX MICROSCOPIC  TSH  I-STAT TROPONIN, ED    EKG EKG Interpretation  Date/Time:  Monday December 31 2018 12:27:02 EST Ventricular Rate:  82 PR Interval:  136 QRS Duration: 72 QT Interval:  400 QTC Calculation: 467 R Axis:   63 Text Interpretation:  Normal sinus rhythm Anterior infarct , age  undetermined Abnormal ECG When compared to prior, possible new t wave inversion in lead 3.  No STEMI Confirmed  by Antony Blackbird [phone]) on 12/31/2018 1:06:36 PM   Radiology No results found.  Procedures Procedures (including critical care time)  Medications Ordered in ED Medications  sodium chloride flush (NS) 0.9 % injection 3 mL (3 mLs Intravenous Given 12/31/18 1521)  labetalol (NORMODYNE,TRANDATE) injection 5 mg (5 mg Intravenous Given 12/31/18 1526)     Initial Impression / Assessment and Plan / ED Course  I have reviewed the triage vital signs and the nursing notes.  Pertinent labs & imaging results that were available during my care of the patient were reviewed by me and considered in my medical decision making (see chart for details).     Robert Rowe is a 85 y.o. male with a past medical history significant for dyslipidemia, rheumatoid arthritis, CHF, CKD, hypertension, hypothyroidism, and documentation of a vertebrobasilar syndrome who presents with 1 month of right hearing loss and 3 days of intermittent dizziness and 1 fall.  Patient reports that for the last month he has not had hearing of his right ear.  This is a new problem.  He reports he is never had this before.  He reports that for the last 3 days he has had intermittent episodes of room spinning dizziness and unsteadiness.  He reports that this led to him having a fall this morning in the bathroom.  He did not hit his head and has no significant headache or neck pain.  He reports that he has had no speech difficulties, vision changes, nausea, vomiting, chest pain, shortness breath, palpitations urinary symptoms or GI symptoms.  He is never had this before.  Patient reports that when his intermittent dizziness began he went take some meclizine that did not help his symptoms.  He reports that he has had no other complaints.  On exam, patient has extraocular been intact.  Patient is no facial droop.  Speech is clear.  Normal  sensation and strength in extremities.  Normal finger-nose-finger testing on my exam.  He was not having the dizziness or spinning on my initial evaluation.  Lungs clear chest nontender.  Abdomen nontender.   EKG shows no STEMI.  Based on the patient symptoms, I am concerned about TIA or stroke given the 1 month of right hearing loss.  Ear exam showed no otitis media, otitis externa, or cerumen impaction.  Patient will have MRI brain without contrast to further evaluate for stroke given the intermittent persistent symptoms.  If MRI is reassuring, anticipate touching base with neurology or hospital service for further management.  Patient did have blood pressure greater than 601 systolic on arrival that has improved without medications in the emergency department.  Also with a chart review revealing this vertebrobasilar syndrome, he may need vascular imaging of his neck.  Anticipate admission for his intermittent symptoms and fluctuant blood pressures.  4:03 PM Patient had blood pressure returned back to the 170s and was given a dose of labetalol.  Blood pressure is improved in the 140s.  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  Anticipate admission for concern for TIA and stroke in the setting of labile blood pressures.       Final Clinical Impressions(s) / ED Diagnoses   Final diagnoses:  Hearing loss of right ear, unspecified hearing loss type  Dizziness     Clinical Impression: 1. Hearing loss of right ear, unspecified  hearing loss type   2. Dizziness     Disposition: Awaiting results of MRI imaging and neurology recommendations, anticipate admission  This note was prepared with assistance of Dragon voice recognition software. Occasional wrong-word or sound-a-like substitutions may have occurred due to the  inherent limitations of voice recognition software.      Cloud Graham, Gwenyth Allegra, MD 12/31/18 1710

## 2019-01-01 ENCOUNTER — Inpatient Hospital Stay (HOSPITAL_COMMUNITY): Payer: Medicare Other

## 2019-01-01 ENCOUNTER — Ambulatory Visit: Payer: Medicare Other | Admitting: Family Medicine

## 2019-01-01 DIAGNOSIS — Z7982 Long term (current) use of aspirin: Secondary | ICD-10-CM | POA: Diagnosis not present

## 2019-01-01 DIAGNOSIS — I130 Hypertensive heart and chronic kidney disease with heart failure and stage 1 through stage 4 chronic kidney disease, or unspecified chronic kidney disease: Secondary | ICD-10-CM | POA: Diagnosis not present

## 2019-01-01 DIAGNOSIS — Z79890 Hormone replacement therapy (postmenopausal): Secondary | ICD-10-CM | POA: Diagnosis not present

## 2019-01-01 DIAGNOSIS — I69398 Other sequelae of cerebral infarction: Secondary | ICD-10-CM | POA: Diagnosis not present

## 2019-01-01 DIAGNOSIS — I340 Nonrheumatic mitral (valve) insufficiency: Secondary | ICD-10-CM

## 2019-01-01 DIAGNOSIS — Z79899 Other long term (current) drug therapy: Secondary | ICD-10-CM | POA: Diagnosis not present

## 2019-01-01 DIAGNOSIS — I10 Essential (primary) hypertension: Secondary | ICD-10-CM | POA: Diagnosis not present

## 2019-01-01 DIAGNOSIS — K219 Gastro-esophageal reflux disease without esophagitis: Secondary | ICD-10-CM | POA: Diagnosis not present

## 2019-01-01 DIAGNOSIS — I69328 Other speech and language deficits following cerebral infarction: Secondary | ICD-10-CM | POA: Diagnosis not present

## 2019-01-01 DIAGNOSIS — R269 Unspecified abnormalities of gait and mobility: Secondary | ICD-10-CM | POA: Diagnosis not present

## 2019-01-01 DIAGNOSIS — N183 Chronic kidney disease, stage 3 (moderate): Secondary | ICD-10-CM | POA: Diagnosis not present

## 2019-01-01 DIAGNOSIS — N401 Enlarged prostate with lower urinary tract symptoms: Secondary | ICD-10-CM | POA: Diagnosis present

## 2019-01-01 DIAGNOSIS — R3915 Urgency of urination: Secondary | ICD-10-CM | POA: Diagnosis present

## 2019-01-01 DIAGNOSIS — H8113 Benign paroxysmal vertigo, bilateral: Secondary | ICD-10-CM

## 2019-01-01 DIAGNOSIS — I639 Cerebral infarction, unspecified: Secondary | ICD-10-CM | POA: Diagnosis not present

## 2019-01-01 DIAGNOSIS — I5032 Chronic diastolic (congestive) heart failure: Secondary | ICD-10-CM | POA: Diagnosis not present

## 2019-01-01 DIAGNOSIS — H5461 Unqualified visual loss, right eye, normal vision left eye: Secondary | ICD-10-CM | POA: Diagnosis present

## 2019-01-01 DIAGNOSIS — I63512 Cerebral infarction due to unspecified occlusion or stenosis of left middle cerebral artery: Secondary | ICD-10-CM | POA: Diagnosis not present

## 2019-01-01 DIAGNOSIS — E785 Hyperlipidemia, unspecified: Secondary | ICD-10-CM | POA: Diagnosis not present

## 2019-01-01 DIAGNOSIS — R29700 NIHSS score 0: Secondary | ICD-10-CM | POA: Diagnosis present

## 2019-01-01 DIAGNOSIS — E039 Hypothyroidism, unspecified: Secondary | ICD-10-CM | POA: Diagnosis not present

## 2019-01-01 DIAGNOSIS — I6300 Cerebral infarction due to thrombosis of unspecified precerebral artery: Secondary | ICD-10-CM | POA: Diagnosis not present

## 2019-01-01 DIAGNOSIS — R296 Repeated falls: Secondary | ICD-10-CM | POA: Diagnosis present

## 2019-01-01 DIAGNOSIS — R159 Full incontinence of feces: Secondary | ICD-10-CM | POA: Diagnosis present

## 2019-01-01 DIAGNOSIS — M069 Rheumatoid arthritis, unspecified: Secondary | ICD-10-CM | POA: Diagnosis not present

## 2019-01-01 DIAGNOSIS — Z7902 Long term (current) use of antithrombotics/antiplatelets: Secondary | ICD-10-CM | POA: Diagnosis not present

## 2019-01-01 DIAGNOSIS — Z8673 Personal history of transient ischemic attack (TIA), and cerebral infarction without residual deficits: Secondary | ICD-10-CM | POA: Diagnosis present

## 2019-01-01 DIAGNOSIS — H9191 Unspecified hearing loss, right ear: Secondary | ICD-10-CM | POA: Diagnosis not present

## 2019-01-01 DIAGNOSIS — R32 Unspecified urinary incontinence: Secondary | ICD-10-CM | POA: Diagnosis present

## 2019-01-01 DIAGNOSIS — G450 Vertebro-basilar artery syndrome: Secondary | ICD-10-CM

## 2019-01-01 DIAGNOSIS — H8110 Benign paroxysmal vertigo, unspecified ear: Secondary | ICD-10-CM | POA: Diagnosis present

## 2019-01-01 DIAGNOSIS — H5462 Unqualified visual loss, left eye, normal vision right eye: Secondary | ICD-10-CM | POA: Diagnosis not present

## 2019-01-01 DIAGNOSIS — R42 Dizziness and giddiness: Secondary | ICD-10-CM | POA: Diagnosis not present

## 2019-01-01 DIAGNOSIS — Z87891 Personal history of nicotine dependence: Secondary | ICD-10-CM | POA: Diagnosis not present

## 2019-01-01 DIAGNOSIS — N189 Chronic kidney disease, unspecified: Secondary | ICD-10-CM | POA: Diagnosis not present

## 2019-01-01 DIAGNOSIS — Z8249 Family history of ischemic heart disease and other diseases of the circulatory system: Secondary | ICD-10-CM | POA: Diagnosis not present

## 2019-01-01 LAB — COMPREHENSIVE METABOLIC PANEL
ALT: 13 U/L (ref 0–44)
AST: 16 U/L (ref 15–41)
Albumin: 3 g/dL — ABNORMAL LOW (ref 3.5–5.0)
Alkaline Phosphatase: 45 U/L (ref 38–126)
Anion gap: 9 (ref 5–15)
BUN: 16 mg/dL (ref 8–23)
CO2: 25 mmol/L (ref 22–32)
Calcium: 9.3 mg/dL (ref 8.9–10.3)
Chloride: 109 mmol/L (ref 98–111)
Creatinine, Ser: 1.58 mg/dL — ABNORMAL HIGH (ref 0.61–1.24)
GFR calc Af Amer: 42 mL/min — ABNORMAL LOW (ref >60)
GFR calc non Af Amer: 36 mL/min — ABNORMAL LOW (ref >60)
Glucose, Bld: 122 mg/dL — ABNORMAL HIGH (ref 70–99)
Potassium: 4 mmol/L (ref 3.5–5.1)
Sodium: 143 mmol/L (ref 135–145)
Total Bilirubin: 0.8 mg/dL (ref 0.3–1.2)
Total Protein: 5.5 g/dL — ABNORMAL LOW (ref 6.5–8.1)

## 2019-01-01 LAB — URINE CULTURE: Culture: NO GROWTH

## 2019-01-01 LAB — CBC
HCT: 40.3 % (ref 39.0–52.0)
Hemoglobin: 13.3 g/dL (ref 13.0–17.0)
MCH: 32 pg (ref 26.0–34.0)
MCHC: 33 g/dL (ref 30.0–36.0)
MCV: 97.1 fL (ref 80.0–100.0)
Platelets: 263 10*3/uL (ref 150–400)
RBC: 4.15 MIL/uL — ABNORMAL LOW (ref 4.22–5.81)
RDW: 13.2 % (ref 11.5–15.5)
WBC: 6.1 10*3/uL (ref 4.0–10.5)
nRBC: 0 % (ref 0.0–0.2)

## 2019-01-01 LAB — ECHOCARDIOGRAM COMPLETE
Height: 62 in
Weight: 2320 oz

## 2019-01-01 LAB — HEMOGLOBIN A1C
Hgb A1c MFr Bld: 6.2 % — ABNORMAL HIGH (ref 4.8–5.6)
Mean Plasma Glucose: 131.24 mg/dL

## 2019-01-01 LAB — PHOSPHORUS: Phosphorus: 3.9 mg/dL (ref 2.5–4.6)

## 2019-01-01 LAB — LIPID PANEL
Cholesterol: 252 mg/dL — ABNORMAL HIGH (ref 0–200)
HDL: 34 mg/dL — ABNORMAL LOW (ref >40)
LDL Cholesterol: 177 mg/dL — ABNORMAL HIGH (ref 0–99)
Total CHOL/HDL Ratio: 7.4 RATIO
Triglycerides: 204 mg/dL — ABNORMAL HIGH (ref <150)
VLDL: 41 mg/dL — ABNORMAL HIGH (ref 0–40)

## 2019-01-01 LAB — MAGNESIUM: Magnesium: 2 mg/dL (ref 1.7–2.4)

## 2019-01-01 NOTE — Progress Notes (Signed)
Patient admitted to room 3W-01, patient A&O x4, patient states that feels dizzy when head of bed is laid flat or moves sudden.  Patient complains of no pain, patient orientated to room. Call light within reach

## 2019-01-01 NOTE — Evaluation (Signed)
Speech Language Pathology Evaluation Patient Details Name: KYRELL RUACHO MRN: [id number] DOB: 1918-02-15 Today's Date: 01/01/2019 Time: 4401-0272 SLP Time Calculation (min) (ACUTE ONLY): 25 min  Problem List:  Patient Active Problem List   Diagnosis Date Noted  . Acute CVA (cerebrovascular accident) ([place]) 01/01/2019  . TIA (transient ischemic attack) 12/31/2018  . CVA (cerebral vascular accident) ([place]) 12/31/2018  . Chest pain 05/15/2018  . CKD (chronic kidney disease) stage 3, GFR 30-59 ml/min (HCC) 05/08/2018  . Other fatigue 06/13/2017  . Abnormal diffusion capacity determined by pulmonary function test 06/13/2017  . Chronic diastolic CHF (congestive heart failure) ([place]) 11/01/2016  . Essential hypertension 11/01/2016  . Intracranial vascular stenosis 11/09/2015  . Mild cognitive impairment 11/09/2015  . 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 Benign prostatic hyperplasia with urinary obstruction 03/19/2015  . Awareness alteration, transient 02/26/2015  . PVC's (premature ventricular contractions) 08/22/2013  . Hypothyroidism 05/09/2013  . Stricture and stenosis of esophagus 03/12/2013  . Dysphagia, unspecified(787.20) 02/22/2013  . DOE (dyspnea on exertion) 03/23/2011  . PALPITATIONS 02/12/2010  . CHEST DISCOMFORT 04/25/2009  . PEDAL EDEMA 05/01/2008  . GERD 12/03/2007  . Dyslipidemia 04/05/2007  . BENIGN PROSTATIC HYPERTROPHY 04/05/2007  . Rheumatoid arthritis ([place]) 04/05/2007  . Osteoarthritis 04/05/2007  . History of cardiovascular disorder 04/05/2007   Past Medical History:  Past Medical History:  Diagnosis Date  . BENIGN PROSTATIC HYPERTROPHY 04/05/2007  . 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 CHEST DISCOMFORT 04/25/2009  . Chronic diastolic CHF (congestive heart failure) ([place])    a. Echo 12/16: mild LVH, EF 55-60%, no RWMA, Gr 1 DD, mild AI, MAC, mild LAE, normal RVF, PASP 37 mmHg  . Chronic kidney disease    chronic   . DJD  (degenerative joint disease)   . Dysrhythmia    pvc's   . GERD 12/03/2007  . Hiatal hernia   . History of nuclear stress test    Myoview 1/17: EF 53%, normal perfusion; Low Risk  . HYPERLIPIDEMIA 04/05/2007  . Hypothyroidism   . Osteoarth NOS-Unspec 04/05/2007  . Palpitations 02/12/2010  . PEDAL EDEMA 05/01/2008  . Rheumatoid arthritis(714.0) 04/05/2007  . Shortness of breath dyspnea   . TRANSIENT ISCHEMIC ATTACK, HX OF 04/05/2007   ?    Past Surgical History:  Past Surgical History:  Procedure Laterality Date  . BALLOON DILATION N/A 03/12/2013   Procedure: BALLOON DILATION;  Surgeon: Inda Castle, MD;  Location: [facility];  Service: Endoscopy;  Laterality: N/A;  . CARPAL TUNNEL RELEASE  11/28/2018   Left hand  . CATARACT EXTRACTION  2001   bilateral  . CYSTOSCOPY WITH URETHRAL DILATATION N/A 09/24/2015   Procedure: CYSTOSCOPY WITH COOK BALLOON DILATATION OF URETHRAL STRICTURE ;  Surgeon: Carolan Clines, MD;  Location: [facility];  Service: Urology;  Laterality: N/A;  . CYSTOSCOPY WITH URETHRAL DILATATION N/A 07/20/2016   Procedure: CYSTOSCOPY WITH URETHRAL DILATATION;  Surgeon: Alexis Frock, MD;  Location: [facility];  Service: Urology;  Laterality: N/A;  1 HOUR  . ESOPHAGOGASTRODUODENOSCOPY N/A 03/12/2013   Procedure: ESOPHAGOGASTRODUODENOSCOPY (EGD);  Surgeon: Inda Castle, MD;  Location: [facility];  Service: Endoscopy;  Laterality: N/A;  . HEMORRHOID SURGERY    . HERNIA REPAIR     inguinal x5  . KNEE SURGERY  arthroscopic   left  . PROSTATE CRYOABLATION     2000  . SHOULDER SURGERY  few years ago   left  . 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 MRI/ MRA showed L  occipital CVA and occlusion L vertebral artery. No slurred speech no visual changes no one-sided weakness. No history of dysphagia. Patient passed the [facility] Stroke Swallow screen and is tolerating a diet.   Assessment / Plan / Recommendation Clinical Impression  Patient admitted to hospital with stroke. He had no slurring of speech or drooping of face upon onset. Family is present and reports no changes in his language or cognitive abilities since onset of stroke. Patient's wife reports him having some mild short term deficits at baseline. Patient was able to communicate at sentence and conversational level throughout examination. It appears that patient is at his baseline level. No further speech and language therpay is recommended at this time.     SLP Assessment  SLP Recommendation/Assessment: Patient does not need any further Speech Lanaguage Pathology Services                     SLP Evaluation Cognition  Overall Cognitive Status: Impaired/Different from baseline Arousal/Alertness: Awake/alert Orientation Level: Oriented X4 Attention: Focused Focused Attention: Appears intact Memory: Impaired Memory Impairment: Retrieval deficit Awareness: Appears intact Problem Solving: Appears intact Executive Function: Reasoning Reasoning: Appears intact Safety/Judgment: Appears intact       Comprehension  Auditory Comprehension Overall Auditory Comprehension: Appears within functional limits for tasks assessed Yes/No Questions: Within Functional Limits Commands: Within Functional Limits Conversation: Complex Visual Recognition/Discrimination Discrimination: Within Function Limits Reading Comprehension Reading Status: Within funtional limits    Expression Expression Primary Mode of Expression: Verbal Verbal Expression Overall Verbal Expression: Appears within functional limits for tasks assessed Initiation: No impairment Level of Generative/Spontaneous Verbalization:  Conversation Repetition: No impairment Naming: No impairment Pragmatics: No impairment Interfering Components: Premorbid deficit(short term memory) Written Expression Dominant Hand: Right   Oral / Motor  Oral Motor/Sensory Function Overall Oral Motor/Sensory Function: Within functional limits Motor Speech Overall Motor Speech: Appears within functional limits for tasks assessed Respiration: Within functional limits Phonation: Normal Resonance: Within functional limits Articulation: Within functional limitis Intelligibility: Intelligible Motor Planning: Witnin functional limits Motor Speech Errors: Not applicable Interfering Components: Premorbid status   GO                    Charlynne Cousins Kiarah Eckstein, MA, CCC-SLP 01/01/2019 3:07 PM

## 2019-01-01 NOTE — Progress Notes (Signed)
OT Cancellation Note  Patient Details Name: Robert Rowe MRN: [id number] DOB: [birth year]   Cancelled Treatment:    Reason Eval/Treat Not Completed: Patient at procedure or test/ unavailable. OT will follow up as time allows.  Gypsy Decant, MS, OTR/L 01/01/2019, 11:24 AM

## 2019-01-01 NOTE — Procedures (Signed)
History: 85 yo M being evaluated for dizziness  Sedation: none  Technique: This is a 21 channel routine scalp EEG performed at the bedside with bipolar and monopolar montages arranged in accordance to the international 10/20 system of electrode placement. One channel was dedicated to EKG recording.    Background: The background consists of intermixed alpha and beta activities. There is a well defined posterior dominant rhythm of 9 Hz that attenuates with eye opening. Sleep is recorded with normal appearing structures.   Photic stimulation: Physiologic driving is not performed  EEG Abnormalities: None  Clinical Interpretation: This normal EEG is recorded in the waking and sleep state. There was no seizure or seizure predisposition recorded on this study. Please note that lack of epileptiform activity on EEG does not preclude the possibility of epilepsy.   Roland Rack, MD [facility] [phone]  If 7pm- 7am, please page neurology on call as listed in [place].

## 2019-01-01 NOTE — Progress Notes (Signed)
PROGRESS NOTE    GARAN FRAPPIER  WSF:[id number] DOB: 1917-12-03 DOA: 12/31/2018 PCP: [name], MD   Brief Narrative:  Per admitting MD: SANDRA TELLEFSEN is a 85 y.o. male with medical history significant of hypothyroidism, GERD, chronic diastolic CHF HTN, CKD  Presented with vertigo symptoms every morning for the past 2 to 3 days.  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  Today noted to be hypotensive with blood pressure 100/70 Unsteady when he walks feels very weak overall. Had a fall today while in the bathroom.  No LOC did not hit his head.  He hit his right knee and hand.  No associated chest pain shortness of breath nausea vomiting  No slurred speech no visual changes no one-sided weakness.  She attempted to use meclizine but did not seem to help his symptoms.  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  Patient is admitted under CVA protocol.  Admit on telemetry, MRI MRA did show an acute ischemic 6 mm nonhemorrhagic infarct in the left supra lobe.  Patient still reports no recovery vision.  Follow-up echocardiogram to evaluate for embolic source.  PT OT and speech therapy pending.  Continue aspirin Plavix as  well as statin.  Patient was seen by neurology in the emergency room and will be continue with the stroke team after admission.  Lipids show total cholesterol 252 with an LDL of 177 HDL 34 and triglycerides of 41, A1c was 6.2  Hyperlipidemia.  Continue statin, patient received dietary counseling,  Hypothyroid continue Synthroid no evidence of overt hormonal dysregulation  Hypertension continue with permissive hypertension per stroke protocol.  CKD 3.  Avoiding nephrotoxic agents, check a BMP in the morning.  Chronic diastolic CHF.  Patient is not in acute exacerbation, will hold off on IV fluids.  Monitor clinically initiate IV diuretics as medically indicated .  DVT prophylaxis: SCD/Compression stockings  Code Status: FULL    Code Status Orders  (From admission, onward)         Start     Ordered   12/31/18 2047  Full code  Continuous     12/31/18 2047        Code Status History    Date Active Date Inactive Code Status Order ID Comments User Context   05/15/2018 0417 05/15/2018 1814 Full Code [id number]  Rise Patience, MD Inpatient   11/15/2015 0351 11/15/2015 1343 Full Code [id number]  Norval Morton, MD Inpatient   03/19/2015 1321 03/20/2015 1324 Full Code [id number]  Carolan Clines, MD Inpatient     Family Communication: Plan was discussed on admission with son and daughter-in-law no one present bedside currently Disposition Plan:  Likely home 1 day Consults called: None Admission status: Inpatient   Consultants:   Neurology  Procedures:  Mr [name] HK Contrast  Result Date: 12/31/2018 CLINICAL DATA:  Initial evaluation for acute intermittent dizziness for 3 days. EXAM: MRI HEAD WITHOUT CONTRAST MRA HEAD WITHOUT CONTRAST MRA NECK WITHOUT CONTRAST TECHNIQUE: Multiplanar, multiecho pulse sequences of the brain and surrounding structures were obtained without intravenous contrast. Angiographic images of the Circle of Willis were obtained using MRA technique  without intravenous contrast. Angiographic images of the neck were obtained using MRA technique without intravenous contrast. Carotid stenosis measurements (when applicable) are obtained utilizing NASCET criteria, using the distal internal carotid diameter as the denominator. COMPARISON:  None. FINDINGS: MRI HEAD FINDINGS Brain: Advanced age-related cerebral atrophy. Minimal T2/FLAIR hyperintensity within the periventricular white matter, most likely related to chronic microvascular ischemic disease, felt to be within normal limits for age. 6 mm focus of diffusion abnormality at the left occipital lobe compatible with a small acute ischemic infarct (series 3, image 18). No associated hemorrhage or mass effect. No other evidence for acute or subacute ischemia. Gray-white matter differentiation otherwise maintained. No encephalomalacia to suggest chronic cortical infarction. No foci of susceptibility artifact to suggest acute or chronic intracranial hemorrhage. No mass lesion, midline shift or mass effect. Diffuse ventricular prominence related to global parenchymal volume loss without hydrocephalus. No extra-axial fluid collection. Pituitary gland normal. Vascular: Major intracranial vascular flow voids maintained. Skull and upper cervical spine: Prominent degenerative thickening at the tectorial membrane with resultant severe stenosis at the craniocervical junction and flattening of the right greater than left upper cervical spinal cord (series 13, image 6). No visible cord signal changes on this exam. Remainder the visualized cervical spine demonstrates no acute finding. Bone marrow signal intensity within normal limits. No scalp soft tissue abnormality. Sinuses/Orbits: Patient status post bilateral ocular lens replacement. Mild scattered mucoperiosteal thickening throughout the paranasal sinuses without evidence for acute sinusitis. No mastoid effusion. Inner ear structures normal. Other: None. MRA HEAD FINDINGS  ANTERIOR CIRCULATION: Distal cervical segments of the internal carotid arteries are patent with symmetric antegrade flow. Petrous segments widely patent bilaterally. Cavernous segments widely patent bilaterally. Moderate narrowing at the supraclinoid segments bilaterally, right slightly greater than left. ICA termini perfused. A1 segment patent. Right A1 not visualized, which could be either hypoplastic and/or absent. Grossly normal anterior communicating artery. Anterior cerebral arteries mildly irregular but patent to their distal aspects without significant stenosis. Mild atheromatous irregularity within the right M1 segment without high-grade stenosis. Normal right MCA bifurcation. Distal right MCA branches well perfused. On the left, there is a severe near occlusive stenosis involving the proximal-mid left M1 segment, measuring approximately 7 mm in length (series 404, image 11). Left M1 narrowed but patent distally. Severe stenosis at the proximal of the left M2 inferior division. Left MCA branches otherwise perfused distally. POSTERIOR CIRCULATION: Left vertebral artery mildly irregular but patent to the vertebrobasilar junction without high-grade stenosis. Patent left PICA. Right vertebral artery occluded at the skull base. Some retrograde filling into the distal right V4 segment across the vertebrobasilar junction. Right PICA not visualized. Moderate to severe segmental stenoses involving the proximal and distal basilar artery (series 406, image 11). Superior cerebral arteries grossly patent bilaterally. Both of the PCAs primarily supplied via the basilar. Additional multifocal severe segmental stenoses involving the proximal and mid P2 segments seen bilaterally. PCAs are perfused to their distal aspects. MRA NECK FINDINGS Examination technically limited by lack of IV contrast. Partially visualized aortic arch  within normal limits for caliber with normal 3 vessel morphology. No appreciable flow-limiting  stenosis about the origin of the great vessels. Partially visualized subclavian arteries patent bilaterally. Right CCA patent from its origin to the bifurcation without obvious flow-limiting stenosis. Atheromatous plaque about the right bifurcation with associated stenosis of approximately 50-60% by NASCET criteria. Right ICA otherwise patent distally. Left CCA tortuous proximally but grossly patent to the bifurcation without appreciable flow-limiting stenosis. No significant atheromatous narrowing about the left bifurcation. Left ICA mildly tortuous but otherwise patent. Both of the vertebral arteries arise from the subclavian arteries. Left vertebral artery dominant and widely patent within the neck. Right vertebral artery diminutive with scant irregular flow within the right V2 segment. Right vertebral otherwise occluded within the neck. IMPRESSION: MRI HEAD IMPRESSION: 1. 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 2. Pronounced degenerative thickening at the tectorial membrane with resultant severe spinal stenosis at the cranial cervical junction and impingement upon the upper cervical spinal cord. No definite cord signal changes evident on this exam. 3. Underlying age-related cerebral atrophy. No other acute intracranial abnormality identified. MRA HEAD IMPRESSION: 1. 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 Dominant left vertebral artery widely patent to the vertebrobasilar junction. 2. 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 3. Severe near occlusive stenosis involving the proximal-mid left M1 segment, with additional downstream proximal left M2 stenosis. 4. Moderate diffuse atheromatous narrowing involving the supraclinoid ICAs bilaterally. MRA NECK IMPRESSION: 1. 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 Dominant left vertebral artery  widely patent with antegrade flow. 2. 50-60% atheromatous stenosis at the right carotid bifurcation/origin of the right ICA. Right carotid artery system otherwise patent. 3. No hemodynamically significant stenosis within the left carotid artery system within the neck. Electronically Signed   By: Jeannine Boga M.D.   On: 12/31/2018 18:39   Mr Jodene Nam Neck Wo Contrast  Result Date: 12/31/2018 CLINICAL DATA:  Initial evaluation for acute intermittent dizziness for 3 days. EXAM: MRI HEAD WITHOUT CONTRAST MRA HEAD WITHOUT CONTRAST MRA NECK WITHOUT CONTRAST TECHNIQUE: Multiplanar, multiecho pulse sequences of the brain and surrounding structures were obtained without intravenous contrast. Angiographic images of the Circle of Willis were obtained using MRA technique without intravenous contrast. Angiographic images of the neck were obtained using MRA technique without intravenous contrast. Carotid stenosis measurements (when applicable) are obtained utilizing NASCET criteria, using the distal internal carotid diameter as the denominator. COMPARISON:  None. FINDINGS: MRI HEAD FINDINGS Brain: Advanced age-related cerebral atrophy. Minimal T2/FLAIR hyperintensity within the periventricular white matter, most likely related to chronic microvascular ischemic disease, felt to be within normal limits for age. 6 mm focus of diffusion abnormality at the left occipital lobe compatible with a small acute ischemic infarct (series 3, image 18). No associated hemorrhage or mass effect. No other evidence for acute or subacute ischemia. Gray-white matter differentiation otherwise maintained. No encephalomalacia to suggest chronic cortical infarction. No foci of susceptibility artifact to suggest acute or chronic intracranial hemorrhage. No mass lesion, midline shift or mass effect. Diffuse ventricular prominence related to global parenchymal volume loss without hydrocephalus. No extra-axial fluid collection. Pituitary gland normal.  Vascular: Major intracranial vascular flow voids maintained. Skull and upper cervical spine: Prominent degenerative thickening at the tectorial membrane with resultant severe stenosis at the craniocervical junction and flattening of the right greater than left upper cervical spinal cord (series 13, image 6). No visible  cord signal changes on this exam. Remainder the visualized cervical spine demonstrates no acute finding. Bone marrow signal intensity within normal limits. No scalp soft tissue abnormality. Sinuses/Orbits: Patient status post bilateral ocular lens replacement. Mild scattered mucoperiosteal thickening throughout the paranasal sinuses without evidence for acute sinusitis. No mastoid effusion. Inner ear structures normal. Other: None. MRA HEAD FINDINGS ANTERIOR CIRCULATION: Distal cervical segments of the internal carotid arteries are patent with symmetric antegrade flow. Petrous segments widely patent bilaterally. Cavernous segments widely patent bilaterally. Moderate narrowing at the supraclinoid segments bilaterally, right slightly greater than left. ICA termini perfused. A1 segment patent. Right A1 not visualized, which could be either hypoplastic and/or absent. Grossly normal anterior communicating artery. Anterior cerebral arteries mildly irregular but patent to their distal aspects without significant stenosis. Mild atheromatous irregularity within the right M1 segment without high-grade stenosis. Normal right MCA bifurcation. Distal right MCA branches well perfused. On the left, there is a severe near occlusive stenosis involving the proximal-mid left M1 segment, measuring approximately 7 mm in length (series 404, image 11). Left M1 narrowed but patent distally. Severe stenosis at the proximal of the left M2 inferior division. Left MCA branches otherwise perfused distally. POSTERIOR CIRCULATION: Left vertebral artery mildly irregular but patent to the vertebrobasilar junction without high-grade  stenosis. Patent left PICA. Right vertebral artery occluded at the skull base. Some retrograde filling into the distal right V4 segment across the vertebrobasilar junction. Right PICA not visualized. Moderate to severe segmental stenoses involving the proximal and distal basilar artery (series 406, image 11). Superior cerebral arteries grossly patent bilaterally. Both of the PCAs primarily supplied via the basilar. Additional multifocal severe segmental stenoses involving the proximal and mid P2 segments seen bilaterally. PCAs are perfused to their distal aspects. MRA NECK FINDINGS Examination technically limited by lack of IV contrast. Partially visualized aortic arch within normal limits for caliber with normal 3 vessel morphology. No appreciable flow-limiting stenosis about the origin of the great vessels. Partially visualized subclavian arteries patent bilaterally. Right CCA patent from its origin to the bifurcation without obvious flow-limiting stenosis. Atheromatous plaque about the right bifurcation with associated stenosis of approximately 50-60% by NASCET criteria. Right ICA otherwise patent distally. Left CCA tortuous proximally but grossly patent to the bifurcation without appreciable flow-limiting stenosis. No significant atheromatous narrowing about the left bifurcation. Left ICA mildly tortuous but otherwise patent. Both of the vertebral arteries arise from the subclavian arteries. Left vertebral artery dominant and widely patent within the neck. Right vertebral artery diminutive with scant irregular flow within the right V2 segment. Right vertebral otherwise occluded within the neck. IMPRESSION: MRI HEAD IMPRESSION: 1. 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 2. Pronounced degenerative thickening at the tectorial membrane with resultant severe spinal stenosis at the cranial cervical junction and impingement upon the upper cervical spinal cord. No definite cord  signal changes evident on this exam. 3. Underlying age-related cerebral atrophy. No other acute intracranial abnormality identified. MRA HEAD IMPRESSION: 1. 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 Dominant left vertebral artery widely patent to the vertebrobasilar junction. 2. 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 3. Severe near occlusive stenosis involving the proximal-mid left M1 segment, with additional downstream proximal left M2 stenosis. 4. Moderate diffuse atheromatous narrowing involving the supraclinoid ICAs bilaterally. MRA NECK IMPRESSION: 1. 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 Dominant left vertebral artery widely patent with antegrade  flow. 2. 50-60% atheromatous stenosis at the right carotid bifurcation/origin of the right ICA. Right carotid artery system otherwise patent. 3. No hemodynamically significant stenosis within the left carotid artery system within the neck. Electronically Signed   By: Jeannine Boga M.D.   On: 12/31/2018 18:39   Mr Brain Wo Contrast  Result Date: 12/31/2018 CLINICAL DATA:  Initial evaluation for acute intermittent dizziness for 3 days. EXAM: MRI HEAD WITHOUT CONTRAST MRA HEAD WITHOUT CONTRAST MRA NECK WITHOUT CONTRAST TECHNIQUE: Multiplanar, multiecho pulse sequences of the brain and surrounding structures were obtained without intravenous contrast. Angiographic images of the Circle of Willis were obtained using MRA technique without intravenous contrast. Angiographic images of the neck were obtained using MRA technique without intravenous contrast. Carotid stenosis measurements (when applicable) are obtained utilizing NASCET criteria, using the distal internal carotid diameter as the denominator. COMPARISON:  None. FINDINGS: MRI HEAD FINDINGS Brain: Advanced age-related cerebral atrophy. Minimal T2/FLAIR  hyperintensity within the periventricular white matter, most likely related to chronic microvascular ischemic disease, felt to be within normal limits for age. 6 mm focus of diffusion abnormality at the left occipital lobe compatible with a small acute ischemic infarct (series 3, image 18). No associated hemorrhage or mass effect. No other evidence for acute or subacute ischemia. Gray-white matter differentiation otherwise maintained. No encephalomalacia to suggest chronic cortical infarction. No foci of susceptibility artifact to suggest acute or chronic intracranial hemorrhage. No mass lesion, midline shift or mass effect. Diffuse ventricular prominence related to global parenchymal volume loss without hydrocephalus. No extra-axial fluid collection. Pituitary gland normal. Vascular: Major intracranial vascular flow voids maintained. Skull and upper cervical spine: Prominent degenerative thickening at the tectorial membrane with resultant severe stenosis at the craniocervical junction and flattening of the right greater than left upper cervical spinal cord (series 13, image 6). No visible cord signal changes on this exam. Remainder the visualized cervical spine demonstrates no acute finding. Bone marrow signal intensity within normal limits. No scalp soft tissue abnormality. Sinuses/Orbits: Patient status post bilateral ocular lens replacement. Mild scattered mucoperiosteal thickening throughout the paranasal sinuses without evidence for acute sinusitis. No mastoid effusion. Inner ear structures normal. Other: None. MRA HEAD FINDINGS ANTERIOR CIRCULATION: Distal cervical segments of the internal carotid arteries are patent with symmetric antegrade flow. Petrous segments widely patent bilaterally. Cavernous segments widely patent bilaterally. Moderate narrowing at the supraclinoid segments bilaterally, right slightly greater than left. ICA termini perfused. A1 segment patent. Right A1 not visualized, which could be  either hypoplastic and/or absent. Grossly normal anterior communicating artery. Anterior cerebral arteries mildly irregular but patent to their distal aspects without significant stenosis. Mild atheromatous irregularity within the right M1 segment without high-grade stenosis. Normal right MCA bifurcation. Distal right MCA branches well perfused. On the left, there is a severe near occlusive stenosis involving the proximal-mid left M1 segment, measuring approximately 7 mm in length (series 404, image 11). Left M1 narrowed but patent distally. Severe stenosis at the proximal of the left M2 inferior division. Left MCA branches otherwise perfused distally. POSTERIOR CIRCULATION: Left vertebral artery mildly irregular but patent to the vertebrobasilar junction without high-grade stenosis. Patent left PICA. Right vertebral artery occluded at the skull base. Some retrograde filling into the distal right V4 segment across the vertebrobasilar junction. Right PICA not visualized. Moderate to severe segmental stenoses involving the proximal and distal basilar artery (series 406, image 11). Superior cerebral arteries grossly patent bilaterally. Both of the PCAs primarily supplied via the basilar. Additional multifocal severe segmental stenoses involving  the proximal and mid P2 segments seen bilaterally. PCAs are perfused to their distal aspects. MRA NECK FINDINGS Examination technically limited by lack of IV contrast. Partially visualized aortic arch within normal limits for caliber with normal 3 vessel morphology. No appreciable flow-limiting stenosis about the origin of the great vessels. Partially visualized subclavian arteries patent bilaterally. Right CCA patent from its origin to the bifurcation without obvious flow-limiting stenosis. Atheromatous plaque about the right bifurcation with associated stenosis of approximately 50-60% by NASCET criteria. Right ICA otherwise patent distally. Left CCA tortuous proximally but  grossly patent to the bifurcation without appreciable flow-limiting stenosis. No significant atheromatous narrowing about the left bifurcation. Left ICA mildly tortuous but otherwise patent. Both of the vertebral arteries arise from the subclavian arteries. Left vertebral artery dominant and widely patent within the neck. Right vertebral artery diminutive with scant irregular flow within the right V2 segment. Right vertebral otherwise occluded within the neck. IMPRESSION: MRI HEAD IMPRESSION: 1. 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 2. Pronounced degenerative thickening at the tectorial membrane with resultant severe spinal stenosis at the cranial cervical junction and impingement upon the upper cervical spinal cord. No definite cord signal changes evident on this exam. 3. Underlying age-related cerebral atrophy. No other acute intracranial abnormality identified. MRA HEAD IMPRESSION: 1. 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 Dominant left vertebral artery widely patent to the vertebrobasilar junction. 2. 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 3. Severe near occlusive stenosis involving the proximal-mid left M1 segment, with additional downstream proximal left M2 stenosis. 4. Moderate diffuse atheromatous narrowing involving the supraclinoid ICAs bilaterally. MRA NECK IMPRESSION: 1. 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 Dominant left vertebral artery widely patent with antegrade flow. 2. 50-60% atheromatous stenosis at the right carotid bifurcation/origin of the right ICA. Right carotid artery system otherwise patent. 3. No hemodynamically significant stenosis within the left carotid artery system within the neck. Electronically Signed   By: Jeannine Boga M.D.   On: 12/31/2018 18:39     Antimicrobials:    None   Subjective: Patient reports no change in loss of left vision.  No new neurological deficits.  Objective: Vitals:   12/31/18 2247 01/01/19 0047 01/01/19 0409 01/01/19 0850  BP: 115/62 113/63 (!) 91/56 (!) 151/93  Pulse: 85 94 92 93  Resp: 16 18 17    Temp: 97.8 F (36.6 C) 97.7 F (36.5 C) 98.5 F (36.9 C) 98.6 F (37 C)  TempSrc: Oral Oral Oral Oral  SpO2: 95% 93% 90% 94%  Weight:      Height:       No intake or output data in the 24 hours ending 01/01/19 1049 Filed Weights   12/31/18 1223  Weight: 65.8 kg    Examination:  General exam: Appears calm and comfortable  Respiratory system: Clear to auscultation. Respiratory effort normal. Cardiovascular system: S1 & S2 heard, RRR. No JVD, murmurs, rubs, gallops or clicks. No pedal edema. Gastrointestinal system: Abdomen is nondistended, soft and nontender. No organomegaly or masses felt. Normal bowel sounds heard. Central nervous system: Alert and oriented. Unchanged loss of vision left eye, motor and sensation intact without deficits Extremities: Symmetric 5 x 5 power. Skin: No rashes, lesions or ulcers Psychiatry: Judgement and insight appear normal. Mood & affect appropriate.     Data Reviewed: I have personally reviewed following labs and imaging studies  CBC: Recent Labs  Lab 12/31/18 1227 01/01/19 0421  WBC 5.9 6.1  HGB 15.8 13.3  HCT 48.3 40.3  MCV 98.4 97.1  PLT 269 132   Basic Metabolic Panel: Recent Labs  Lab 12/31/18 1227 01/01/19 0421  NA 141 143  K 4.9 4.0  CL 105 109  CO2 27 25  GLUCOSE 156* 122*  BUN 16 16  CREATININE 1.56* 1.58*  CALCIUM 9.7 9.3  MG  --  2.0  PHOS  --  3.9   GFR: Estimated Creatinine Clearance: 21.8 mL/min (A) (by C-G formula based on SCr of 1.58 mg/dL (H)). Liver Function Tests: Recent Labs  Lab 01/01/19 0421  AST 16  ALT 13  ALKPHOS 45  BILITOT 0.8  PROT 5.5*  ALBUMIN 3.0*   No results for input(s): LIPASE, AMYLASE in the last 168 hours. No  results for input(s): AMMONIA in the last 168 hours. Coagulation Profile: No results for input(s): INR, PROTIME in the last 168 hours. Cardiac Enzymes: No results for input(s): CKTOTAL, CKMB, CKMBINDEX, TROPONINI in the last 168 hours. BNP (last 3 results) No results for input(s): PROBNP in the last 8760 hours. HbA1C: Recent Labs    01/01/19 0421  HGBA1C 6.2*   CBG: Recent Labs  Lab 12/31/18 1320  GLUCAP 154*   Lipid Profile: Recent Labs    01/01/19 0421  CHOL 252*  HDL 34*  LDLCALC 177*  TRIG 204*  CHOLHDL 7.4   Thyroid Function Tests: Recent Labs    12/31/18 1428  TSH 2.124   Anemia Panel: No results for input(s): VITAMINB12, FOLATE, FERRITIN, TIBC, IRON, RETICCTPCT in the last 72 hours. Sepsis Labs: No results for input(s): PROCALCITON, LATICACIDVEN in the last 168 hours.  No results found for this or any previous visit (from the past 240 hour(s)).       Radiology Studies: Mr [name] GM Contrast  Result Date: 12/31/2018 CLINICAL DATA:  Initial evaluation for acute intermittent dizziness for 3 days. EXAM: MRI HEAD WITHOUT CONTRAST MRA HEAD WITHOUT CONTRAST MRA NECK WITHOUT CONTRAST TECHNIQUE: Multiplanar, multiecho pulse sequences of the brain and surrounding structures were obtained without intravenous contrast. Angiographic images of the Circle of Willis were obtained using MRA technique without intravenous contrast. Angiographic images of the neck were obtained using MRA technique without intravenous contrast. Carotid stenosis measurements (when applicable) are obtained utilizing NASCET criteria, using the distal internal carotid diameter as the denominator. COMPARISON:  None. FINDINGS: MRI HEAD FINDINGS Brain: Advanced age-related cerebral atrophy. Minimal T2/FLAIR hyperintensity within the periventricular white matter, most likely related to chronic microvascular ischemic disease, felt to be within normal limits for age. 6 mm focus of diffusion abnormality at  the left occipital lobe compatible with a small acute ischemic infarct (series 3, image 18). No associated hemorrhage or mass effect. No other evidence for acute or subacute ischemia. Gray-white matter differentiation otherwise maintained. No encephalomalacia to suggest chronic cortical infarction. No foci of susceptibility artifact to suggest acute or chronic intracranial hemorrhage. No mass lesion, midline shift or mass effect. Diffuse ventricular prominence related to global parenchymal volume loss without hydrocephalus. No extra-axial fluid collection. Pituitary gland normal. Vascular: Major intracranial vascular flow voids maintained. Skull and upper cervical spine: Prominent degenerative thickening at the tectorial membrane with resultant severe stenosis at the craniocervical junction and flattening of the right greater than left upper cervical spinal cord (series 13, image 6). No visible cord signal changes on this exam. Remainder the visualized cervical spine demonstrates no acute finding. Bone marrow signal intensity within normal limits.  No scalp soft tissue abnormality. Sinuses/Orbits: Patient status post bilateral ocular lens replacement. Mild scattered mucoperiosteal thickening throughout the paranasal sinuses without evidence for acute sinusitis. No mastoid effusion. Inner ear structures normal. Other: None. MRA HEAD FINDINGS ANTERIOR CIRCULATION: Distal cervical segments of the internal carotid arteries are patent with symmetric antegrade flow. Petrous segments widely patent bilaterally. Cavernous segments widely patent bilaterally. Moderate narrowing at the supraclinoid segments bilaterally, right slightly greater than left. ICA termini perfused. A1 segment patent. Right A1 not visualized, which could be either hypoplastic and/or absent. Grossly normal anterior communicating artery. Anterior cerebral arteries mildly irregular but patent to their distal aspects without significant stenosis. Mild  atheromatous irregularity within the right M1 segment without high-grade stenosis. Normal right MCA bifurcation. Distal right MCA branches well perfused. On the left, there is a severe near occlusive stenosis involving the proximal-mid left M1 segment, measuring approximately 7 mm in length (series 404, image 11). Left M1 narrowed but patent distally. Severe stenosis at the proximal of the left M2 inferior division. Left MCA branches otherwise perfused distally. POSTERIOR CIRCULATION: Left vertebral artery mildly irregular but patent to the vertebrobasilar junction without high-grade stenosis. Patent left PICA. Right vertebral artery occluded at the skull base. Some retrograde filling into the distal right V4 segment across the vertebrobasilar junction. Right PICA not visualized. Moderate to severe segmental stenoses involving the proximal and distal basilar artery (series 406, image 11). Superior cerebral arteries grossly patent bilaterally. Both of the PCAs primarily supplied via the basilar. Additional multifocal severe segmental stenoses involving the proximal and mid P2 segments seen bilaterally. PCAs are perfused to their distal aspects. MRA NECK FINDINGS Examination technically limited by lack of IV contrast. Partially visualized aortic arch within normal limits for caliber with normal 3 vessel morphology. No appreciable flow-limiting stenosis about the origin of the great vessels. Partially visualized subclavian arteries patent bilaterally. Right CCA patent from its origin to the bifurcation without obvious flow-limiting stenosis. Atheromatous plaque about the right bifurcation with associated stenosis of approximately 50-60% by NASCET criteria. Right ICA otherwise patent distally. Left CCA tortuous proximally but grossly patent to the bifurcation without appreciable flow-limiting stenosis. No significant atheromatous narrowing about the left bifurcation. Left ICA mildly tortuous but otherwise patent. Both  of the vertebral arteries arise from the subclavian arteries. Left vertebral artery dominant and widely patent within the neck. Right vertebral artery diminutive with scant irregular flow within the right V2 segment. Right vertebral otherwise occluded within the neck. IMPRESSION: MRI HEAD IMPRESSION: 1. 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 2. Pronounced degenerative thickening at the tectorial membrane with resultant severe spinal stenosis at the cranial cervical junction and impingement upon the upper cervical spinal cord. No definite cord signal changes evident on this exam. 3. Underlying age-related cerebral atrophy. No other acute intracranial abnormality identified. MRA HEAD IMPRESSION: 1. 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 Dominant left vertebral artery widely patent to the vertebrobasilar junction. 2. 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 3. Severe near occlusive stenosis involving the proximal-mid left M1 segment, with additional downstream proximal left M2 stenosis. 4. Moderate diffuse atheromatous narrowing involving the supraclinoid ICAs bilaterally. MRA NECK IMPRESSION: 1. 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 Dominant left vertebral artery widely patent with antegrade flow. 2. 50-60% atheromatous stenosis at the right carotid bifurcation/origin of the right ICA. Right carotid artery system otherwise patent. 3. No  hemodynamically significant stenosis within the left carotid artery system within the neck. Electronically Signed   By: Jeannine Boga M.D.   On: 12/31/2018 18:39   Mr Jodene Nam Neck Wo Contrast  Result Date: 12/31/2018 CLINICAL DATA:  Initial evaluation for acute intermittent dizziness for 3 days. EXAM: MRI HEAD WITHOUT CONTRAST MRA HEAD WITHOUT CONTRAST MRA NECK WITHOUT CONTRAST TECHNIQUE:  Multiplanar, multiecho pulse sequences of the brain and surrounding structures were obtained without intravenous contrast. Angiographic images of the Circle of Willis were obtained using MRA technique without intravenous contrast. Angiographic images of the neck were obtained using MRA technique without intravenous contrast. Carotid stenosis measurements (when applicable) are obtained utilizing NASCET criteria, using the distal internal carotid diameter as the denominator. COMPARISON:  None. FINDINGS: MRI HEAD FINDINGS Brain: Advanced age-related cerebral atrophy. Minimal T2/FLAIR hyperintensity within the periventricular white matter, most likely related to chronic microvascular ischemic disease, felt to be within normal limits for age. 6 mm focus of diffusion abnormality at the left occipital lobe compatible with a small acute ischemic infarct (series 3, image 18). No associated hemorrhage or mass effect. No other evidence for acute or subacute ischemia. Gray-white matter differentiation otherwise maintained. No encephalomalacia to suggest chronic cortical infarction. No foci of susceptibility artifact to suggest acute or chronic intracranial hemorrhage. No mass lesion, midline shift or mass effect. Diffuse ventricular prominence related to global parenchymal volume loss without hydrocephalus. No extra-axial fluid collection. Pituitary gland normal. Vascular: Major intracranial vascular flow voids maintained. Skull and upper cervical spine: Prominent degenerative thickening at the tectorial membrane with resultant severe stenosis at the craniocervical junction and flattening of the right greater than left upper cervical spinal cord (series 13, image 6). No visible cord signal changes on this exam. Remainder the visualized cervical spine demonstrates no acute finding. Bone marrow signal intensity within normal limits. No scalp soft tissue abnormality. Sinuses/Orbits: Patient status post bilateral ocular lens  replacement. Mild scattered mucoperiosteal thickening throughout the paranasal sinuses without evidence for acute sinusitis. No mastoid effusion. Inner ear structures normal. Other: None. MRA HEAD FINDINGS ANTERIOR CIRCULATION: Distal cervical segments of the internal carotid arteries are patent with symmetric antegrade flow. Petrous segments widely patent bilaterally. Cavernous segments widely patent bilaterally. Moderate narrowing at the supraclinoid segments bilaterally, right slightly greater than left. ICA termini perfused. A1 segment patent. Right A1 not visualized, which could be either hypoplastic and/or absent. Grossly normal anterior communicating artery. Anterior cerebral arteries mildly irregular but patent to their distal aspects without significant stenosis. Mild atheromatous irregularity within the right M1 segment without high-grade stenosis. Normal right MCA bifurcation. Distal right MCA branches well perfused. On the left, there is a severe near occlusive stenosis involving the proximal-mid left M1 segment, measuring approximately 7 mm in length (series 404, image 11). Left M1 narrowed but patent distally. Severe stenosis at the proximal of the left M2 inferior division. Left MCA branches otherwise perfused distally. POSTERIOR CIRCULATION: Left vertebral artery mildly irregular but patent to the vertebrobasilar junction without high-grade stenosis. Patent left PICA. Right vertebral artery occluded at the skull base. Some retrograde filling into the distal right V4 segment across the vertebrobasilar junction. Right PICA not visualized. Moderate to severe segmental stenoses involving the proximal and distal basilar artery (series 406, image 11). Superior cerebral arteries grossly patent bilaterally. Both of the PCAs primarily supplied via the basilar. Additional multifocal severe segmental stenoses involving the proximal and mid P2 segments seen bilaterally. PCAs are perfused to their distal aspects.  MRA NECK FINDINGS Examination technically  limited by lack of IV contrast. Partially visualized aortic arch within normal limits for caliber with normal 3 vessel morphology. No appreciable flow-limiting stenosis about the origin of the great vessels. Partially visualized subclavian arteries patent bilaterally. Right CCA patent from its origin to the bifurcation without obvious flow-limiting stenosis. Atheromatous plaque about the right bifurcation with associated stenosis of approximately 50-60% by NASCET criteria. Right ICA otherwise patent distally. Left CCA tortuous proximally but grossly patent to the bifurcation without appreciable flow-limiting stenosis. No significant atheromatous narrowing about the left bifurcation. Left ICA mildly tortuous but otherwise patent. Both of the vertebral arteries arise from the subclavian arteries. Left vertebral artery dominant and widely patent within the neck. Right vertebral artery diminutive with scant irregular flow within the right V2 segment. Right vertebral otherwise occluded within the neck. IMPRESSION: MRI HEAD IMPRESSION: 1. 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 2. Pronounced degenerative thickening at the tectorial membrane with resultant severe spinal stenosis at the cranial cervical junction and impingement upon the upper cervical spinal cord. No definite cord signal changes evident on this exam. 3. Underlying age-related cerebral atrophy. No other acute intracranial abnormality identified. MRA HEAD IMPRESSION: 1. 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 Dominant left vertebral artery widely patent to the vertebrobasilar junction. 2. 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 3. Severe near occlusive stenosis involving the proximal-mid left M1 segment, with additional downstream proximal left M2 stenosis.  4. Moderate diffuse atheromatous narrowing involving the supraclinoid ICAs bilaterally. MRA NECK IMPRESSION: 1. 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 Dominant left vertebral artery widely patent with antegrade flow. 2. 50-60% atheromatous stenosis at the right carotid bifurcation/origin of the right ICA. Right carotid artery system otherwise patent. 3. No hemodynamically significant stenosis within the left carotid artery system within the neck. Electronically Signed   By: Jeannine Boga M.D.   On: 12/31/2018 18:39   Mr Brain Wo Contrast  Result Date: 12/31/2018 CLINICAL DATA:  Initial evaluation for acute intermittent dizziness for 3 days. EXAM: MRI HEAD WITHOUT CONTRAST MRA HEAD WITHOUT CONTRAST MRA NECK WITHOUT CONTRAST TECHNIQUE: Multiplanar, multiecho pulse sequences of the brain and surrounding structures were obtained without intravenous contrast. Angiographic images of the Circle of Willis were obtained using MRA technique without intravenous contrast. Angiographic images of the neck were obtained using MRA technique without intravenous contrast. Carotid stenosis measurements (when applicable) are obtained utilizing NASCET criteria, using the distal internal carotid diameter as the denominator. COMPARISON:  None. FINDINGS: MRI HEAD FINDINGS Brain: Advanced age-related cerebral atrophy. Minimal T2/FLAIR hyperintensity within the periventricular white matter, most likely related to chronic microvascular ischemic disease, felt to be within normal limits for age. 6 mm focus of diffusion abnormality at the left occipital lobe compatible with a small acute ischemic infarct (series 3, image 18). No associated hemorrhage or mass effect. No other evidence for acute or subacute ischemia. Gray-white matter differentiation otherwise maintained. No encephalomalacia to suggest chronic cortical infarction. No foci of susceptibility artifact to suggest acute or chronic intracranial hemorrhage.  No mass lesion, midline shift or mass effect. Diffuse ventricular prominence related to global parenchymal volume loss without hydrocephalus. No extra-axial fluid collection. Pituitary gland normal. Vascular: Major intracranial vascular flow voids maintained. Skull and upper cervical spine: Prominent degenerative thickening at the tectorial membrane with resultant severe stenosis at the craniocervical junction and flattening of the right greater than left upper cervical  spinal cord (series 13, image 6). No visible cord signal changes on this exam. Remainder the visualized cervical spine demonstrates no acute finding. Bone marrow signal intensity within normal limits. No scalp soft tissue abnormality. Sinuses/Orbits: Patient status post bilateral ocular lens replacement. Mild scattered mucoperiosteal thickening throughout the paranasal sinuses without evidence for acute sinusitis. No mastoid effusion. Inner ear structures normal. Other: None. MRA HEAD FINDINGS ANTERIOR CIRCULATION: Distal cervical segments of the internal carotid arteries are patent with symmetric antegrade flow. Petrous segments widely patent bilaterally. Cavernous segments widely patent bilaterally. Moderate narrowing at the supraclinoid segments bilaterally, right slightly greater than left. ICA termini perfused. A1 segment patent. Right A1 not visualized, which could be either hypoplastic and/or absent. Grossly normal anterior communicating artery. Anterior cerebral arteries mildly irregular but patent to their distal aspects without significant stenosis. Mild atheromatous irregularity within the right M1 segment without high-grade stenosis. Normal right MCA bifurcation. Distal right MCA branches well perfused. On the left, there is a severe near occlusive stenosis involving the proximal-mid left M1 segment, measuring approximately 7 mm in length (series 404, image 11). Left M1 narrowed but patent distally. Severe stenosis at the proximal of the  left M2 inferior division. Left MCA branches otherwise perfused distally. POSTERIOR CIRCULATION: Left vertebral artery mildly irregular but patent to the vertebrobasilar junction without high-grade stenosis. Patent left PICA. Right vertebral artery occluded at the skull base. Some retrograde filling into the distal right V4 segment across the vertebrobasilar junction. Right PICA not visualized. Moderate to severe segmental stenoses involving the proximal and distal basilar artery (series 406, image 11). Superior cerebral arteries grossly patent bilaterally. Both of the PCAs primarily supplied via the basilar. Additional multifocal severe segmental stenoses involving the proximal and mid P2 segments seen bilaterally. PCAs are perfused to their distal aspects. MRA NECK FINDINGS Examination technically limited by lack of IV contrast. Partially visualized aortic arch within normal limits for caliber with normal 3 vessel morphology. No appreciable flow-limiting stenosis about the origin of the great vessels. Partially visualized subclavian arteries patent bilaterally. Right CCA patent from its origin to the bifurcation without obvious flow-limiting stenosis. Atheromatous plaque about the right bifurcation with associated stenosis of approximately 50-60% by NASCET criteria. Right ICA otherwise patent distally. Left CCA tortuous proximally but grossly patent to the bifurcation without appreciable flow-limiting stenosis. No significant atheromatous narrowing about the left bifurcation. Left ICA mildly tortuous but otherwise patent. Both of the vertebral arteries arise from the subclavian arteries. Left vertebral artery dominant and widely patent within the neck. Right vertebral artery diminutive with scant irregular flow within the right V2 segment. Right vertebral otherwise occluded within the neck. IMPRESSION: MRI HEAD IMPRESSION: 1. 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 2.  Pronounced degenerative thickening at the tectorial membrane with resultant severe spinal stenosis at the cranial cervical junction and impingement upon the upper cervical spinal cord. No definite cord signal changes evident on this exam. 3. Underlying age-related cerebral atrophy. No other acute intracranial abnormality identified. MRA HEAD IMPRESSION: 1. 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 Dominant left vertebral artery widely patent to the vertebrobasilar junction. 2. 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 3. Severe near occlusive stenosis involving the proximal-mid left M1 segment, with additional downstream proximal left M2 stenosis. 4. Moderate diffuse atheromatous narrowing involving the supraclinoid ICAs bilaterally. MRA NECK IMPRESSION: 1. 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  Dominant left vertebral artery widely patent with antegrade flow. 2. 50-60% atheromatous stenosis at the right carotid bifurcation/origin of the right ICA. Right carotid artery system otherwise patent. 3. No hemodynamically significant stenosis within the left carotid artery system within the neck. Electronically Signed   By: Jeannine Boga M.D.   On: 12/31/2018 18:39        Scheduled Meds: . aspirin  300 mg Rectal Daily   Or  . aspirin  325 mg Oral Daily  . atorvastatin  80 mg Oral q1800  . clopidogrel  75 mg Oral Daily  . latanoprost  1 drop Both Eyes QHS  . levothyroxine  50 mcg Oral QAC breakfast  . pantoprazole  40 mg Oral QAC breakfast   Continuous Infusions:   LOS: 0 days    Time spent: 35 min    Nicolette Bang, MD [facility]  If 7PM-7AM, please contact night-coverage  01/01/2019, 10:49 AM

## 2019-01-01 NOTE — Consult Note (Signed)
Physical Medicine and Rehabilitation Consult Reason for Consult:  Decreased functional mobility Referring Physician: [facility]   HPI: Robert Rowe is a 85 y.o.right handed male with history of TIA, PVCs hypothyroidism, chronic diastolic congestive heart failure, hypertension,CKD. Presented to 12/31/2018 with vertigo as well as deafness right ear 2-3 days. Patient had made plans to see an ENT. There were reports of a fall in the bathroom with no loss of consciousness. Upon arrival patient's blood pressure 201/97. Patient had been seen approximately 6 months ago by Dr. Delice Lesch for episodes of syncope/BPPV as well as bilateral leg weakness and slurred speech. 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 Recurrent symptoms were concerning for hypoperfusion causing TIA. Patient was maintained on Plavix. 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  Echocardiogram pending. Troponin was negative. Patient currently is maintained on aspirin and Plavix for CVA prophylaxis. Tolerating a regular diet. Therapy evaluation completed with recommendations of physical medicine rehabilitation consult.  Review of Systems  Constitutional: Negative for chills and fever.  HENT: Positive for hearing loss.   Eyes: Positive for blurred vision.  Respiratory: Positive for shortness of breath. Negative for cough.   Cardiovascular: Positive for palpitations and leg swelling.  Gastrointestinal: Positive for constipation. Negative for nausea.       GERD    Genitourinary: Positive for urgency. Negative for dysuria and flank pain.  Musculoskeletal: Positive for myalgias.  Skin: Negative for rash.  Neurological: Positive for dizziness.       Vertigo  All other systems reviewed and are negative.  Past Medical History:  Diagnosis Date  . BENIGN PROSTATIC HYPERTROPHY 04/05/2007  . 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 CHEST DISCOMFORT 04/25/2009  . Chronic diastolic CHF (congestive heart failure) ([place])    a. Echo 12/16: mild LVH, EF 55-60%, no RWMA, Gr 1 DD, mild AI, MAC, mild LAE, normal RVF, PASP 37 mmHg  . Chronic kidney disease    chronic   . DJD (degenerative joint disease)   . Dysrhythmia    pvc's   . GERD 12/03/2007  . Hiatal hernia   . History of nuclear stress test    Myoview 1/17: EF 53%, normal perfusion; Low Risk  . HYPERLIPIDEMIA 04/05/2007  . Hypothyroidism   . Osteoarth NOS-Unspec 04/05/2007  . Palpitations 02/12/2010  . PEDAL EDEMA 05/01/2008  . Rheumatoid arthritis(714.0) 04/05/2007  . Shortness of breath dyspnea   . TRANSIENT ISCHEMIC ATTACK, HX OF 04/05/2007   ?    Past Surgical History:  Procedure Laterality Date  . BALLOON DILATION N/A 03/12/2013   Procedure: BALLOON DILATION;  Surgeon: Inda Castle, MD;  Location: [facility];  Service: Endoscopy;  Laterality: N/A;  . CARPAL TUNNEL RELEASE  11/28/2018   Left hand  . CATARACT EXTRACTION  2001   bilateral  . CYSTOSCOPY WITH URETHRAL DILATATION N/A 09/24/2015   Procedure: CYSTOSCOPY WITH COOK BALLOON DILATATION OF URETHRAL STRICTURE ;  Surgeon: Carolan Clines, MD;  Location: [facility];  Service: Urology;  Laterality: N/A;  . CYSTOSCOPY WITH URETHRAL DILATATION N/A 07/20/2016  Procedure: CYSTOSCOPY WITH URETHRAL DILATATION;  Surgeon: Alexis Frock, MD;  Location: [facility];  Service: Urology;  Laterality: N/A;  1 HOUR  . ESOPHAGOGASTRODUODENOSCOPY N/A 03/12/2013   Procedure: ESOPHAGOGASTRODUODENOSCOPY (EGD);  Surgeon: Inda Castle, MD;   Location: [facility];  Service: Endoscopy;  Laterality: N/A;  . HEMORRHOID SURGERY    . HERNIA REPAIR     inguinal x5  . KNEE SURGERY  arthroscopic   left  . PROSTATE CRYOABLATION     2000  . SHOULDER SURGERY  few years ago   left  . TRANSURETHRAL RESECTION OF PROSTATE N/A 03/19/2015   Procedure: TRANSURETHRAL RESECTION OF THE PROSTATE (TURP);  Surgeon: Carolan Clines, MD;  Location: [facility];  Service: Urology;  Laterality: N/A;   Family History  Problem Relation Age of Onset  . Breast cancer Mother   . Heart disease Father   . Heart attack Father   . Lymphoma Brother   . Stroke Neg Hx   . Hypertension Neg Hx    Social History:  reports that he quit smoking about 65 years ago. His smoking use included cigarettes. He has never used smokeless tobacco. He reports current alcohol use. He reports that he does not use drugs. Allergies:  Allergies  Allergen Reactions  . Codeine Other (See Comments)    Patient was "all over the place"- undesired side effect  . Sulfasalazine Other (See Comments)    Reaction not recalled  . Sulfonamide Derivatives Other (See Comments)    Reaction not recalled   Medications Prior to Admission  Medication Sig Dispense Refill  . acetaminophen (TYLENOL) 500 MG tablet Take 1,000 mg by mouth daily after breakfast.    . clopidogrel (PLAVIX) 75 MG tablet TAKE 1 TABLET BY MOUTH  DAILY (Patient taking differently: Take 75 mg by mouth daily. ) 90 tablet 1  . furosemide (LASIX) 20 MG tablet TAKE 1 TABLET BY MOUTH  DAILY AS NEEDED FOR FLUID  OR EDEMA. (Patient taking differently: Take 20-40 mg by mouth daily as needed (for fluid retention in the legs). ) 90 tablet 3  . latanoprost (XALATAN) 0.005 % ophthalmic solution Place 1 drop into both eyes at bedtime.   99  . levothyroxine (SYNTHROID, LEVOTHROID) 50 MCG tablet TAKE 1 TABLET BY MOUTH  DAILY (Patient taking differently: Take 50 mcg by mouth daily before breakfast. ) 90 tablet 2  . metoprolol succinate  (TOPROL-XL) 25 MG 24 hr tablet TAKE 1 TABLET BY MOUTH AT  BEDTIME (Patient taking differently: Take 25 mg by mouth at bedtime. ) 90 tablet 1  . Multiple Vitamin (MULTIVITAMIN WITH MINERALS) TABS tablet Take 1 tablet by mouth 2 (two) times a week.     . nitroGLYCERIN (NITROSTAT) 0.4 MG SL tablet Place 0.4 mg under the tongue every 5 (five) minutes as needed (for Esophageal spasms).     . pantoprazole (PROTONIX) 40 MG tablet TAKE 1 TABLET BY MOUTH  DAILY (Patient taking differently: Take 40 mg by mouth daily before breakfast. ) 90 tablet 1  . albuterol (PROVENTIL HFA;VENTOLIN HFA) 108 (90 Base) MCG/ACT inhaler Inhale 2 puffs into the lungs every 4 (four) hours as needed for wheezing or shortness of breath. (Patient not taking: Reported on 12/31/2018) 1 Inhaler 0    Home: Home Living Family/patient expects to be discharged to:: Private residence Living Arrangements: Spouse/significant other Available Help at Discharge: Family, Available 24 hours/day Type of Home: House Home Access: Stairs to enter [employer] of Steps: 4 Entrance Stairs-Rails: Right Home Layout: Two level(chair  lift) Alternate Level Stairs-Number of Steps: flight Bathroom Shower/Tub: Tub/shower unit Home Equipment: Environmental consultant - 2 wheels  Functional History: Prior Function Level of Independence: Independent Comments: used no AD in house, used RW occasionally outside of home Functional Status:  Mobility: Bed Mobility General bed mobility comments: received sitting EOB and was dizzy Transfers Overall transfer level: Needs assistance Equipment used: Rolling walker (2 wheeled), 1 person hand held assist Transfers: Sit to/from Stand, Stand Pivot Transfers Sit to Stand: Mod assist Stand pivot transfers: Mod assist General transfer comment: SPT to [facility] with mod A for power up as well as for support in standing due to B knee instability and extreme dizziness. Pt also stood to RW with mod A for bowel cleanup.    Ambulation/Gait General Gait Details: unable due to dizziness    ADL:    Cognition: Cognition Overall Cognitive Status: Impaired/Different from baseline Orientation Level: Oriented X4 Cognition Arousal/Alertness: Awake/alert Behavior During Therapy: WFL for tasks assessed/performed Overall Cognitive Status: Impaired/Different from baseline Area of Impairment: Memory Memory: Decreased short-term memory, Decreased recall of precautions General Comments: repeats self frequently, after being told not to stand without assist, he attempted from [facility] despite fact that he was given call bell and therapist right outside door. Pt reports that his memory and word finding abilities have been declining for awhile, unsure how much worse he is now  Blood pressure (!) 151/93, pulse 93, temperature 97.6 F (36.4 C), resp. rate 18, height _0  (1.575 m), weight 65.8 kg, SpO2 98 %. Physical Exam  Constitutional: He is oriented to person, place, and time. He appears well-developed. No distress.  HENT:  Head: Normocephalic.  Eyes: Pupils are equal, round, and reactive to light.  Neck: Normal range of motion.  Cardiovascular: Normal rate and regular rhythm. Exam reveals no friction rub.  No murmur heard. Respiratory: Effort normal and breath sounds normal. No respiratory distress. He has no wheezes. He has no rales.  GI: Soft. He exhibits no distension.  Neurological: He is alert and oriented to person, place, and time.  Follows commands. Speech clear. Visual loss, partially in the LLQ. Tracks to all fields, no nystagmus. HOH, especilly through right ear.  Strength 4 to 4+/5 throughout. No sensory loss.     Skin: Skin is warm and dry.  Scattered bruises  Psychiatric: He has a normal mood and affect. His behavior is normal.    Results for orders placed or performed during the hospital encounter of 12/31/18 (from the past 24 hour(s))  CBG monitoring, ED     Status: Abnormal   Collection Time:  12/31/18  1:20 PM  Result Value Ref Range   Glucose-Capillary 154 (H) 70 - 99 mg/dL   Comment 1 Notify RN    Comment 2 Document in Chart   I-Stat Troponin, ED (not at [facility])     Status: None   Collection Time: 12/31/18  1:54 PM  Result Value Ref Range   Troponin i, poc 0.01 0.00 - 0.08 ng/mL   Comment 3          Urine culture     Status: None   Collection Time: 12/31/18  2:00 PM  Result Value Ref Range   Specimen Description URINE, CLEAN CATCH    Special Requests NONE    Culture      NO GROWTH Performed at [facility], [place] [address], [place], [place] [address]    Report Status 01/01/2019 FINAL   TSH     Status: None  Collection Time: 12/31/18  2:28 PM  Result Value Ref Range   TSH 2.124 0.350 - 4.500 uIU/mL  Hemoglobin A1c     Status: Abnormal   Collection Time: 01/01/19  4:21 AM  Result Value Ref Range   Hgb A1c MFr Bld 6.2 (H) 4.8 - 5.6 %   Mean Plasma Glucose 131.24 mg/dL  Lipid panel     Status: Abnormal   Collection Time: 01/01/19  4:21 AM  Result Value Ref Range   Cholesterol 252 (H) 0 - 200 mg/dL   Triglycerides 204 (H) <150 mg/dL   HDL 34 (L) >40 mg/dL   Total CHOL/HDL Ratio 7.4 RATIO   VLDL 41 (H) 0 - 40 mg/dL   LDL Cholesterol 177 (H) 0 - 99 mg/dL  Magnesium     Status: None   Collection Time: 01/01/19  4:21 AM  Result Value Ref Range   Magnesium 2.0 1.7 - 2.4 mg/dL  Phosphorus     Status: None   Collection Time: 01/01/19  4:21 AM  Result Value Ref Range   Phosphorus 3.9 2.5 - 4.6 mg/dL  Comprehensive metabolic panel     Status: Abnormal   Collection Time: 01/01/19  4:21 AM  Result Value Ref Range   Sodium 143 135 - 145 mmol/L   Potassium 4.0 3.5 - 5.1 mmol/L   Chloride 109 98 - 111 mmol/L   CO2 25 22 - 32 mmol/L   Glucose, Bld 122 (H) 70 - 99 mg/dL   BUN 16 8 - 23 mg/dL   Creatinine, Ser 1.58 (H) 0.61 - 1.24 mg/dL   Calcium 9.3 8.9 - 10.3 mg/dL   Total Protein 5.5 (L) 6.5 - 8.1 g/dL   Albumin 3.0 (L) 3.5 - 5.0 g/dL   AST 16 15 - 41 U/L    ALT 13 0 - 44 U/L   Alkaline Phosphatase 45 38 - 126 U/L   Total Bilirubin 0.8 0.3 - 1.2 mg/dL   GFR calc non Af Amer 36 (L) >60 mL/min   GFR calc Af Amer 42 (L) >60 mL/min   Anion gap 9 5 - 15  CBC     Status: Abnormal   Collection Time: 01/01/19  4:21 AM  Result Value Ref Range   WBC 6.1 4.0 - 10.5 K/uL   RBC 4.15 (L) 4.22 - 5.81 MIL/uL   Hemoglobin 13.3 13.0 - 17.0 g/dL   HCT 40.3 39.0 - 52.0 %   MCV 97.1 80.0 - 100.0 fL   MCH 32.0 26.0 - 34.0 pg   MCHC 33.0 30.0 - 36.0 g/dL   RDW 13.2 11.5 - 15.5 %   Platelets 263 150 - 400 K/uL   nRBC 0.0 0.0 - 0.2 %   Mr [facility] Wo Contrast  Result Date: 12/31/2018 CLINICAL DATA:  Initial evaluation for acute intermittent dizziness for 3 days. EXAM: MRI HEAD WITHOUT CONTRAST MRA HEAD WITHOUT CONTRAST MRA NECK WITHOUT CONTRAST TECHNIQUE: Multiplanar, multiecho pulse sequences of the brain and surrounding structures were obtained without intravenous contrast. Angiographic images of the Circle of Willis were obtained using MRA technique without intravenous contrast. Angiographic images of the neck were obtained using MRA technique without intravenous contrast. Carotid stenosis measurements (when applicable) are obtained utilizing NASCET criteria, using the distal internal carotid diameter as the denominator. COMPARISON:  None. FINDINGS: MRI HEAD FINDINGS Brain: Advanced age-related cerebral atrophy. Minimal T2/FLAIR hyperintensity within the periventricular white matter, most likely related to chronic microvascular ischemic disease, felt to be within normal limits for age.  6 mm focus of diffusion abnormality at the left occipital lobe compatible with a small acute ischemic infarct (series 3, image 18). No associated hemorrhage or mass effect. No other evidence for acute or subacute ischemia. Gray-white matter differentiation otherwise maintained. No encephalomalacia to suggest chronic cortical infarction. No foci of susceptibility artifact to suggest acute  or chronic intracranial hemorrhage. No mass lesion, midline shift or mass effect. Diffuse ventricular prominence related to global parenchymal volume loss without hydrocephalus. No extra-axial fluid collection. Pituitary gland normal. Vascular: Major intracranial vascular flow voids maintained. Skull and upper cervical spine: Prominent degenerative thickening at the tectorial membrane with resultant severe stenosis at the craniocervical junction and flattening of the right greater than left upper cervical spinal cord (series 13, image 6). No visible cord signal changes on this exam. Remainder the visualized cervical spine demonstrates no acute finding. Bone marrow signal intensity within normal limits. No scalp soft tissue abnormality. Sinuses/Orbits: Patient status post bilateral ocular lens replacement. Mild scattered mucoperiosteal thickening throughout the paranasal sinuses without evidence for acute sinusitis. No mastoid effusion. Inner ear structures normal. Other: None. MRA HEAD FINDINGS ANTERIOR CIRCULATION: Distal cervical segments of the internal carotid arteries are patent with symmetric antegrade flow. Petrous segments widely patent bilaterally. Cavernous segments widely patent bilaterally. Moderate narrowing at the supraclinoid segments bilaterally, right slightly greater than left. ICA termini perfused. A1 segment patent. Right A1 not visualized, which could be either hypoplastic and/or absent. Grossly normal anterior communicating artery. Anterior cerebral arteries mildly irregular but patent to their distal aspects without significant stenosis. Mild atheromatous irregularity within the right M1 segment without high-grade stenosis. Normal right MCA bifurcation. Distal right MCA branches well perfused. On the left, there is a severe near occlusive stenosis involving the proximal-mid left M1 segment, measuring approximately 7 mm in length (series 404, image 11). Left M1 narrowed but patent distally.  Severe stenosis at the proximal of the left M2 inferior division. Left MCA branches otherwise perfused distally. POSTERIOR CIRCULATION: Left vertebral artery mildly irregular but patent to the vertebrobasilar junction without high-grade stenosis. Patent left PICA. Right vertebral artery occluded at the skull base. Some retrograde filling into the distal right V4 segment across the vertebrobasilar junction. Right PICA not visualized. Moderate to severe segmental stenoses involving the proximal and distal basilar artery (series 406, image 11). Superior cerebral arteries grossly patent bilaterally. Both of the PCAs primarily supplied via the basilar. Additional multifocal severe segmental stenoses involving the proximal and mid P2 segments seen bilaterally. PCAs are perfused to their distal aspects. MRA NECK FINDINGS Examination technically limited by lack of IV contrast. Partially visualized aortic arch within normal limits for caliber with normal 3 vessel morphology. No appreciable flow-limiting stenosis about the origin of the great vessels. Partially visualized subclavian arteries patent bilaterally. Right CCA patent from its origin to the bifurcation without obvious flow-limiting stenosis. Atheromatous plaque about the right bifurcation with associated stenosis of approximately 50-60% by NASCET criteria. Right ICA otherwise patent distally. Left CCA tortuous proximally but grossly patent to the bifurcation without appreciable flow-limiting stenosis. No significant atheromatous narrowing about the left bifurcation. Left ICA mildly tortuous but otherwise patent. Both of the vertebral arteries arise from the subclavian arteries. Left vertebral artery dominant and widely patent within the neck. Right vertebral artery diminutive with scant irregular flow within the right V2 segment. Right vertebral otherwise occluded within the neck. IMPRESSION: MRI HEAD IMPRESSION: 1. 6 mm acute ischemic nonhemorrhagic infarct  involving the left occipital lobe. 2. Pronounced degenerative thickening at the  tectorial membrane with resultant severe spinal stenosis at the cranial cervical junction and impingement upon the upper cervical spinal cord. No definite cord signal changes evident on this exam. 3. Underlying age-related cerebral atrophy. No other acute intracranial abnormality identified. MRA HEAD IMPRESSION: 1. 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 Dominant left vertebral artery widely patent to the vertebrobasilar junction. 2. 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 3. Severe near occlusive stenosis involving the proximal-mid left M1 segment, with additional downstream proximal left M2 stenosis. 4. Moderate diffuse atheromatous narrowing involving the supraclinoid ICAs bilaterally. MRA NECK IMPRESSION: 1. 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 Dominant left vertebral artery widely patent with antegrade flow. 2. 50-60% atheromatous stenosis at the right carotid bifurcation/origin of the right ICA. Right carotid artery system otherwise patent. 3. No hemodynamically significant stenosis within the left carotid artery system within the neck. Electronically Signed   By: Jeannine Boga M.D.   On: 12/31/2018 18:39   Mr Jodene Nam Neck Wo Contrast  Result Date: 12/31/2018 CLINICAL DATA:  Initial evaluation for acute intermittent dizziness for 3 days. EXAM: MRI HEAD WITHOUT CONTRAST MRA HEAD WITHOUT CONTRAST MRA NECK WITHOUT CONTRAST TECHNIQUE: Multiplanar, multiecho pulse sequences of the brain and surrounding structures were obtained without intravenous contrast. Angiographic images of the Circle of Willis were obtained using MRA technique without intravenous contrast. Angiographic images of the neck were obtained using MRA technique without intravenous contrast. Carotid stenosis measurements  (when applicable) are obtained utilizing NASCET criteria, using the distal internal carotid diameter as the denominator. COMPARISON:  None. FINDINGS: MRI HEAD FINDINGS Brain: Advanced age-related cerebral atrophy. Minimal T2/FLAIR hyperintensity within the periventricular white matter, most likely related to chronic microvascular ischemic disease, felt to be within normal limits for age. 6 mm focus of diffusion abnormality at the left occipital lobe compatible with a small acute ischemic infarct (series 3, image 18). No associated hemorrhage or mass effect. No other evidence for acute or subacute ischemia. Gray-white matter differentiation otherwise maintained. No encephalomalacia to suggest chronic cortical infarction. No foci of susceptibility artifact to suggest acute or chronic intracranial hemorrhage. No mass lesion, midline shift or mass effect. Diffuse ventricular prominence related to global parenchymal volume loss without hydrocephalus. No extra-axial fluid collection. Pituitary gland normal. Vascular: Major intracranial vascular flow voids maintained. Skull and upper cervical spine: Prominent degenerative thickening at the tectorial membrane with resultant severe stenosis at the craniocervical junction and flattening of the right greater than left upper cervical spinal cord (series 13, image 6). No visible cord signal changes on this exam. Remainder the visualized cervical spine demonstrates no acute finding. Bone marrow signal intensity within normal limits. No scalp soft tissue abnormality. Sinuses/Orbits: Patient status post bilateral ocular lens replacement. Mild scattered mucoperiosteal thickening throughout the paranasal sinuses without evidence for acute sinusitis. No mastoid effusion. Inner ear structures normal. Other: None. MRA HEAD FINDINGS ANTERIOR CIRCULATION: Distal cervical segments of the internal carotid arteries are patent with symmetric antegrade flow. Petrous segments widely patent  bilaterally. Cavernous segments widely patent bilaterally. Moderate narrowing at the supraclinoid segments bilaterally, right slightly greater than left. ICA termini perfused. A1 segment patent. Right A1 not visualized, which could be either hypoplastic and/or absent. Grossly normal anterior communicating artery. Anterior cerebral arteries mildly irregular but patent to their distal aspects without significant stenosis. Mild atheromatous irregularity within the right M1 segment without high-grade stenosis. Normal right MCA bifurcation. Distal right MCA  branches well perfused. On the left, there is a severe near occlusive stenosis involving the proximal-mid left M1 segment, measuring approximately 7 mm in length (series 404, image 11). Left M1 narrowed but patent distally. Severe stenosis at the proximal of the left M2 inferior division. Left MCA branches otherwise perfused distally. POSTERIOR CIRCULATION: Left vertebral artery mildly irregular but patent to the vertebrobasilar junction without high-grade stenosis. Patent left PICA. Right vertebral artery occluded at the skull base. Some retrograde filling into the distal right V4 segment across the vertebrobasilar junction. Right PICA not visualized. Moderate to severe segmental stenoses involving the proximal and distal basilar artery (series 406, image 11). Superior cerebral arteries grossly patent bilaterally. Both of the PCAs primarily supplied via the basilar. Additional multifocal severe segmental stenoses involving the proximal and mid P2 segments seen bilaterally. PCAs are perfused to their distal aspects. MRA NECK FINDINGS Examination technically limited by lack of IV contrast. Partially visualized aortic arch within normal limits for caliber with normal 3 vessel morphology. No appreciable flow-limiting stenosis about the origin of the great vessels. Partially visualized subclavian arteries patent bilaterally. Right CCA patent from its origin to the  bifurcation without obvious flow-limiting stenosis. Atheromatous plaque about the right bifurcation with associated stenosis of approximately 50-60% by NASCET criteria. Right ICA otherwise patent distally. Left CCA tortuous proximally but grossly patent to the bifurcation without appreciable flow-limiting stenosis. No significant atheromatous narrowing about the left bifurcation. Left ICA mildly tortuous but otherwise patent. Both of the vertebral arteries arise from the subclavian arteries. Left vertebral artery dominant and widely patent within the neck. Right vertebral artery diminutive with scant irregular flow within the right V2 segment. Right vertebral otherwise occluded within the neck. IMPRESSION: MRI HEAD IMPRESSION: 1. 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 2. Pronounced degenerative thickening at the tectorial membrane with resultant severe spinal stenosis at the cranial cervical junction and impingement upon the upper cervical spinal cord. No definite cord signal changes evident on this exam. 3. Underlying age-related cerebral atrophy. No other acute intracranial abnormality identified. MRA HEAD IMPRESSION: 1. 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 Dominant left vertebral artery widely patent to the vertebrobasilar junction. 2. 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 3. Severe near occlusive stenosis involving the proximal-mid left M1 segment, with additional downstream proximal left M2 stenosis. 4. Moderate diffuse atheromatous narrowing involving the supraclinoid ICAs bilaterally. MRA NECK IMPRESSION: 1. 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 Dominant left vertebral artery widely patent with antegrade flow. 2. 50-60% atheromatous stenosis at the right carotid bifurcation/origin of the right ICA. Right carotid artery  system otherwise patent. 3. No hemodynamically significant stenosis within the left carotid artery system within the neck. Electronically Signed   By: Jeannine Boga M.D.   On: 12/31/2018 18:39   Mr Brain Wo Contrast  Result Date: 12/31/2018 CLINICAL DATA:  Initial evaluation for acute intermittent dizziness for 3 days. EXAM: MRI HEAD WITHOUT CONTRAST MRA HEAD WITHOUT CONTRAST MRA NECK WITHOUT CONTRAST TECHNIQUE: Multiplanar, multiecho pulse sequences of the brain and surrounding structures were obtained without intravenous contrast. Angiographic images of the Circle of Willis were obtained using MRA technique without intravenous contrast. Angiographic images of the neck were obtained using MRA technique without intravenous contrast. Carotid stenosis measurements (when applicable) are obtained utilizing NASCET criteria, using the distal internal carotid diameter as the denominator. COMPARISON:  None. FINDINGS: MRI HEAD  FINDINGS Brain: Advanced age-related cerebral atrophy. Minimal T2/FLAIR hyperintensity within the periventricular white matter, most likely related to chronic microvascular ischemic disease, felt to be within normal limits for age. 6 mm focus of diffusion abnormality at the left occipital lobe compatible with a small acute ischemic infarct (series 3, image 18). No associated hemorrhage or mass effect. No other evidence for acute or subacute ischemia. Gray-white matter differentiation otherwise maintained. No encephalomalacia to suggest chronic cortical infarction. No foci of susceptibility artifact to suggest acute or chronic intracranial hemorrhage. No mass lesion, midline shift or mass effect. Diffuse ventricular prominence related to global parenchymal volume loss without hydrocephalus. No extra-axial fluid collection. Pituitary gland normal. Vascular: Major intracranial vascular flow voids maintained. Skull and upper cervical spine: Prominent degenerative thickening at the tectorial  membrane with resultant severe stenosis at the craniocervical junction and flattening of the right greater than left upper cervical spinal cord (series 13, image 6). No visible cord signal changes on this exam. Remainder the visualized cervical spine demonstrates no acute finding. Bone marrow signal intensity within normal limits. No scalp soft tissue abnormality. Sinuses/Orbits: Patient status post bilateral ocular lens replacement. Mild scattered mucoperiosteal thickening throughout the paranasal sinuses without evidence for acute sinusitis. No mastoid effusion. Inner ear structures normal. Other: None. MRA HEAD FINDINGS ANTERIOR CIRCULATION: Distal cervical segments of the internal carotid arteries are patent with symmetric antegrade flow. Petrous segments widely patent bilaterally. Cavernous segments widely patent bilaterally. Moderate narrowing at the supraclinoid segments bilaterally, right slightly greater than left. ICA termini perfused. A1 segment patent. Right A1 not visualized, which could be either hypoplastic and/or absent. Grossly normal anterior communicating artery. Anterior cerebral arteries mildly irregular but patent to their distal aspects without significant stenosis. Mild atheromatous irregularity within the right M1 segment without high-grade stenosis. Normal right MCA bifurcation. Distal right MCA branches well perfused. On the left, there is a severe near occlusive stenosis involving the proximal-mid left M1 segment, measuring approximately 7 mm in length (series 404, image 11). Left M1 narrowed but patent distally. Severe stenosis at the proximal of the left M2 inferior division. Left MCA branches otherwise perfused distally. POSTERIOR CIRCULATION: Left vertebral artery mildly irregular but patent to the vertebrobasilar junction without high-grade stenosis. Patent left PICA. Right vertebral artery occluded at the skull base. Some retrograde filling into the distal right V4 segment across  the vertebrobasilar junction. Right PICA not visualized. Moderate to severe segmental stenoses involving the proximal and distal basilar artery (series 406, image 11). Superior cerebral arteries grossly patent bilaterally. Both of the PCAs primarily supplied via the basilar. Additional multifocal severe segmental stenoses involving the proximal and mid P2 segments seen bilaterally. PCAs are perfused to their distal aspects. MRA NECK FINDINGS Examination technically limited by lack of IV contrast. Partially visualized aortic arch within normal limits for caliber with normal 3 vessel morphology. No appreciable flow-limiting stenosis about the origin of the great vessels. Partially visualized subclavian arteries patent bilaterally. Right CCA patent from its origin to the bifurcation without obvious flow-limiting stenosis. Atheromatous plaque about the right bifurcation with associated stenosis of approximately 50-60% by NASCET criteria. Right ICA otherwise patent distally. Left CCA tortuous proximally but grossly patent to the bifurcation without appreciable flow-limiting stenosis. No significant atheromatous narrowing about the left bifurcation. Left ICA mildly tortuous but otherwise patent. Both of the vertebral arteries arise from the subclavian arteries. Left vertebral artery dominant and widely patent within the neck. Right vertebral artery diminutive with scant irregular flow within the right V2 segment.  Right vertebral otherwise occluded within the neck. IMPRESSION: MRI HEAD IMPRESSION: 1. 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 2. Pronounced degenerative thickening at the tectorial membrane with resultant severe spinal stenosis at the cranial cervical junction and impingement upon the upper cervical spinal cord. No definite cord signal changes evident on this exam. 3. Underlying age-related cerebral atrophy. No other acute intracranial abnormality identified. MRA HEAD  IMPRESSION: 1. 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 Dominant left vertebral artery widely patent to the vertebrobasilar junction. 2. 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 3. Severe near occlusive stenosis involving the proximal-mid left M1 segment, with additional downstream proximal left M2 stenosis. 4. Moderate diffuse atheromatous narrowing involving the supraclinoid ICAs bilaterally. MRA NECK IMPRESSION: 1. 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 Dominant left vertebral artery widely patent with antegrade flow. 2. 50-60% atheromatous stenosis at the right carotid bifurcation/origin of the right ICA. Right carotid artery system otherwise patent. 3. No hemodynamically significant stenosis within the left carotid artery system within the neck. Electronically Signed   By: Jeannine Boga M.D.   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 Does the need for close, 24 hr/day medical supervision in concert with the patient's rehab needs make it unreasonable for this patient to be served in a less intensive setting? Yes and Potentially 2. Co-Morbidities requiring supervision/potential complications: cdCHF, HTN, CKD 3. Due to bladder management, bowel management, safety, skin/wound care, disease management, medication administration, pain management and patient education, does the patient require 24 hr/day rehab nursing? Yes 4. Does the patient require coordinated care of a physician, rehab nurse, PT (1-2 hrs/day, 5 days/week) and OT (1-2 hrs/day, 5 days/week) to address physical and functional deficits in the context of the above medical diagnosis(es)? Yes Addressing deficits in the following areas: balance, endurance, locomotion, strength, transferring, bowel/bladder control, bathing,  dressing, feeding, grooming, toileting and psychosocial support 5. Can the patient actively participate in an intensive therapy program of at least 3 hrs of therapy per day at least 5 days per week? Yes 6. The potential for patient to make measurable gains while on inpatient rehab is excellent 7. Anticipated functional outcomes upon discharge from inpatient rehab are modified independent  with PT, modified independent with OT, n/a with SLP. 8. Estimated rehab length of stay to reach the above functional goals is: 7-11 days 9. Anticipated D/C setting: Home 10. Anticipated post D/C treatments: Willis therapy 11. Overall Rehab/Functional Prognosis: excellent  RECOMMENDATIONS: This patient's condition is appropriate for continued rehabilitative care in the following setting: CIR Patient has agreed to participate in recommended program. Yes and Potentially Note that insurance prior authorization may be required for reimbursement for recommended care.  Comment: Pt's exam was fairly benign today except for vision. Balance and coordination deficits more evident when ambulating. Rehab Admissions Coordinator to follow up.  Thanks,  [name], MD, Mellody Drown  I have personally performed a face to face diagnostic evaluation of this patient. Additionally, I have reviewed and concur with the physician assistant's documentation above.    Lavon Paganini Angiulli, PA-C 01/01/2019

## 2019-01-01 NOTE — Progress Notes (Signed)
EEG complete - results pending 

## 2019-01-01 NOTE — Progress Notes (Signed)
Rehab Admissions Coordinator Note:  Patient was screened by Retta Diones for appropriateness for an Inpatient Acute Rehab Consult.  At this time, we are recommending Inpatient Rehab consult.  Jodell Cipro M 01/01/2019, 1:00 PM  I can be reached at [phone].

## 2019-01-01 NOTE — Evaluation (Signed)
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  Clinical Impression  Pt admitted with above diagnosis. Pt currently with functional limitations due to the deficits listed below (see PT Problem List). Pt very dizzy in sitting and dizzy and weak with standing, required mod A for sit to stand as well as pivot to and from [facility] with B knees buckling on return. Dizziness not associated with nausea and persists with different head positions. Unsafe to ambulate at his point.  Pt will benefit from skilled PT to increase their independence and safety with mobility to allow discharge to the venue listed below.       Follow Up Recommendations CIR;Supervision/Assistance - 24 hour    Equipment Recommendations  None recommended by PT    Recommendations for Other Services Rehab consult;OT consult;Speech consult     Precautions / Restrictions Precautions Precautions: Fall Precaution Comments: dizzy with all motion Restrictions Weight Bearing Restrictions: No      Mobility  Bed Mobility               General bed mobility comments: received sitting EOB and was dizzy  Transfers Overall transfer level: Needs assistance Equipment used: Rolling walker (2 wheeled);1 person hand held assist Transfers: Sit to/from [facility] Sit to Stand: Mod assist Stand pivot transfers: Mod assist       General transfer comment: SPT to [facility] with mod A for power up as well as for support in standing due to B knee instability and extreme dizziness. Pt also stood to RW with mod A for bowel cleanup.   Ambulation/Gait             General Gait Details: unable due to dizziness  Stairs             Wheelchair Mobility    Modified Rankin (Stroke Patients Only) Modified Rankin (Stroke Patients Only) Pre-Morbid Rankin Score: No symptoms Modified Rankin: Severe disability     Balance Overall balance assessment: Needs assistance Sitting-balance support: Feet supported;Bilateral upper extremity supported Sitting balance-Leahy Scale: Poor Sitting balance - Comments: unable to sit without UE support due to dizziness   Standing balance support: Bilateral upper extremity supported Standing balance-Leahy Scale: Poor Standing balance comment: requires UE support as well as external assist to maintain standing                             Pertinent Vitals/Pain Pain Assessment: No/denies pain    Home Living Family/patient expects to be discharged to:: Private residence Living Arrangements: Spouse/significant other Available Help at Discharge: Family;Available 24 hours/day Type of Home: House Home Access: Stairs to enter Entrance Stairs-Rails: Right Entrance Stairs-Number of Steps: 4 Home Layout: Two level(chair lift) Home Equipment: Walker - 2 wheels      Prior Function Level of Independence: Independent         Comments: used no AD in house, used RW occasionally outside of home     Hand Dominance        Extremity/Trunk Assessment   Upper Extremity Assessment Upper Extremity Assessment: Defer to OT evaluation    Lower Extremity Assessment Lower Extremity Assessment: Generalized weakness    Cervical / Trunk Assessment Cervical /  Trunk Assessment: Kyphotic  Communication   Communication: No difficulties(suddenly lost hearing in L ear ~1 mo ago)  Cognition Arousal/Alertness: Awake/alert Behavior During Therapy: WFL for tasks assessed/performed Overall Cognitive Status: Impaired/Different from baseline Area of Impairment: Memory                     Memory: Decreased short-term memory;Decreased recall of precautions          General Comments: repeats self frequently, after being told not to stand without assist, he attempted from [facility] despite fact that he was given call bell and therapist right outside door. Pt reports that his memory and word finding abilities have been declining for awhile, unsure how much worse he is now      General Comments General comments (skin integrity, edema, etc.): pt's dizziness is intense but does not cause nausea, does not seem to be worse with head in any specific position, occurs in supine but it worse in sitting and even worse in standing. Cannot stand long enough to tolerate a standing BP    Exercises     Assessment/Plan    PT Assessment Patient needs continued PT services  PT Problem List Decreased strength;Decreased activity tolerance;Decreased balance;Decreased mobility;Decreased coordination;Decreased cognition;Decreased knowledge of use of DME;Decreased safety awareness;Decreased knowledge of precautions       PT Treatment Interventions DME instruction;Gait training;Stair training;Functional mobility training;Therapeutic activities;Therapeutic exercise;Balance training;Neuromuscular re-education;Cognitive remediation;Patient/family education    PT Goals (Current goals can be found in the Care Plan section)  Acute Rehab PT Goals Patient Stated Goal: return home PT Goal Formulation: With patient Time For Goal Achievement: 01/15/19 Potential to Achieve Goals: Fair    Frequency Min 4X/week   Barriers to discharge Inaccessible home environment 4 STE home    Co-evaluation               AM-PAC PT "6 Clicks" Mobility  Outcome Measure Help needed turning from your back to your side while in a flat bed without using bedrails?: A Lot Help needed moving from lying on your back to sitting on the side of a flat bed without using bedrails?: A Lot Help needed moving to and from a bed to a chair (including a wheelchair)?: A Lot Help needed standing up from a chair using  your arms (e.g., wheelchair or bedside chair)?: A Lot Help needed to walk in hospital room?: Total Help needed climbing 3-5 steps with a railing? : Total 6 Click Score: 10    End of Session Equipment Utilized During Treatment: Gait belt Activity Tolerance: Treatment limited secondary to medical complications (Comment)(dizziness) Patient left: in bed;with call bell/phone within reach;with family/visitor present;with bed alarm set Nurse Communication: Mobility status PT Visit Diagnosis: Unsteadiness on feet (R26.81);Dizziness and giddiness (R42);Difficulty in walking, not elsewhere classified (R26.2);Muscle weakness (generalized) (M62.81);History of falling (Z91.81)    Time: 0921-1000 PT Time Calculation (min) (ACUTE ONLY): 39 min   Charges:   PT Evaluation $PT Eval Moderate Complexity: 1 Mod PT Treatments $Gait Training: 8-22 mins $Neuromuscular Re-education: 8-22 mins        Leighton Roach, PT  Acute Rehab Services  Pager [phone] Office [place] 01/01/2019, 11:46 AM

## 2019-01-01 NOTE — Progress Notes (Signed)
Patient's current BP 91/56   Notified provider on call

## 2019-01-01 NOTE — Progress Notes (Addendum)
STROKE TEAM PROGRESS NOTE   INTERVAL HISTORY His wife and son are at the bedside.  Discussed OP neurosurgery eval of cord compression with attending.  Pt describes dizziness when he gets OOB and stands up with everything rotating counter clockwise. It does not occur when he turns over in bed. It has been going on for a few weeks. He also had new onset R ear hearing loss 1 mo ago. He sees Dr. Charlotte Crumb for "spells" - see below.  Vitals:   12/31/18 2047 12/31/18 2247 01/01/19 0047 01/01/19 0409  BP: (!) 146/76 115/62 113/63 (!) 91/56  Pulse:  85 94 92  Resp: 18 16 18 17   Temp: 97.7 F (36.5 C) 97.8 F (36.6 C) 97.7 F (36.5 C) 98.5 F (36.9 C)  TempSrc: Oral Oral Oral Oral  SpO2: 98% 95% 93% 90%  Weight:      Height:        CBC:  Recent Labs  Lab 12/31/18 1227 01/01/19 0421  WBC 5.9 6.1  HGB 15.8 13.3  HCT 48.3 40.3  MCV 98.4 97.1  PLT 269 628    Basic Metabolic Panel:  Recent Labs  Lab 12/31/18 1227 01/01/19 0421  NA 141 143  K 4.9 4.0  CL 105 109  CO2 27 25  GLUCOSE 156* 122*  BUN 16 16  CREATININE 1.56* 1.58*  CALCIUM 9.7 9.3  MG  --  2.0  PHOS  --  3.9   Lipid Panel:     Component Value Date/Time   CHOL 252 (H) 01/01/2019 0421   TRIG 204 (H) 01/01/2019 0421   HDL 34 (L) 01/01/2019 0421   CHOLHDL 7.4 01/01/2019 0421   VLDL 41 (H) 01/01/2019 0421   LDLCALC 177 (H) 01/01/2019 0421   HgbA1c:  Lab Results  Component Value Date   HGBA1C 6.2 (H) 01/01/2019   Urine Drug Screen: No results found for: LABOPIA, COCAINSCRNUR, LABBENZ, AMPHETMU, THCU, LABBARB  Alcohol Level No results found for: [facility]  IMAGING Mr Jodene Nam Head Wo Contrast  Result Date: 12/31/2018 CLINICAL DATA:  Initial evaluation for acute intermittent dizziness for 3 days. EXAM: MRI HEAD WITHOUT CONTRAST MRA HEAD WITHOUT CONTRAST MRA NECK WITHOUT CONTRAST TECHNIQUE: Multiplanar, multiecho pulse sequences of the brain and surrounding structures were obtained without intravenous contrast.  Angiographic images of the Circle of Willis were obtained using MRA technique without intravenous contrast. Angiographic images of the neck were obtained using MRA technique without intravenous contrast. Carotid stenosis measurements (when applicable) are obtained utilizing NASCET criteria, using the distal internal carotid diameter as the denominator. COMPARISON:  None. FINDINGS: MRI HEAD FINDINGS Brain: Advanced age-related cerebral atrophy. Minimal T2/FLAIR hyperintensity within the periventricular white matter, most likely related to chronic microvascular ischemic disease, felt to be within normal limits for age. 6 mm focus of diffusion abnormality at the left occipital lobe compatible with a small acute ischemic infarct (series 3, image 18). No associated hemorrhage or mass effect. No other evidence for acute or subacute ischemia. Gray-white matter differentiation otherwise maintained. No encephalomalacia to suggest chronic cortical infarction. No foci of susceptibility artifact to suggest acute or chronic intracranial hemorrhage. No mass lesion, midline shift or mass effect. Diffuse ventricular prominence related to global parenchymal volume loss without hydrocephalus. No extra-axial fluid collection. Pituitary gland normal. Vascular: Major intracranial vascular flow voids maintained. Skull and upper cervical spine: Prominent degenerative thickening at the tectorial membrane with resultant severe stenosis at the craniocervical junction and flattening of the right greater than left upper cervical spinal cord (  series 13, image 6). No visible cord signal changes on this exam. Remainder the visualized cervical spine demonstrates no acute finding. Bone marrow signal intensity within normal limits. No scalp soft tissue abnormality. Sinuses/Orbits: Patient status post bilateral ocular lens replacement. Mild scattered mucoperiosteal thickening throughout the paranasal sinuses without evidence for acute sinusitis. No  mastoid effusion. Inner ear structures normal. Other: None. MRA HEAD FINDINGS ANTERIOR CIRCULATION: Distal cervical segments of the internal carotid arteries are patent with symmetric antegrade flow. Petrous segments widely patent bilaterally. Cavernous segments widely patent bilaterally. Moderate narrowing at the supraclinoid segments bilaterally, right slightly greater than left. ICA termini perfused. A1 segment patent. Right A1 not visualized, which could be either hypoplastic and/or absent. Grossly normal anterior communicating artery. Anterior cerebral arteries mildly irregular but patent to their distal aspects without significant stenosis. Mild atheromatous irregularity within the right M1 segment without high-grade stenosis. Normal right MCA bifurcation. Distal right MCA branches well perfused. On the left, there is a severe near occlusive stenosis involving the proximal-mid left M1 segment, measuring approximately 7 mm in length (series 404, image 11). Left M1 narrowed but patent distally. Severe stenosis at the proximal of the left M2 inferior division. Left MCA branches otherwise perfused distally. POSTERIOR CIRCULATION: Left vertebral artery mildly irregular but patent to the vertebrobasilar junction without high-grade stenosis. Patent left PICA. Right vertebral artery occluded at the skull base. Some retrograde filling into the distal right V4 segment across the vertebrobasilar junction. Right PICA not visualized. Moderate to severe segmental stenoses involving the proximal and distal basilar artery (series 406, image 11). Superior cerebral arteries grossly patent bilaterally. Both of the PCAs primarily supplied via the basilar. Additional multifocal severe segmental stenoses involving the proximal and mid P2 segments seen bilaterally. PCAs are perfused to their distal aspects. MRA NECK FINDINGS Examination technically limited by lack of IV contrast. Partially visualized aortic arch within normal limits  for caliber with normal 3 vessel morphology. No appreciable flow-limiting stenosis about the origin of the great vessels. Partially visualized subclavian arteries patent bilaterally. Right CCA patent from its origin to the bifurcation without obvious flow-limiting stenosis. Atheromatous plaque about the right bifurcation with associated stenosis of approximately 50-60% by NASCET criteria. Right ICA otherwise patent distally. Left CCA tortuous proximally but grossly patent to the bifurcation without appreciable flow-limiting stenosis. No significant atheromatous narrowing about the left bifurcation. Left ICA mildly tortuous but otherwise patent. Both of the vertebral arteries arise from the subclavian arteries. Left vertebral artery dominant and widely patent within the neck. Right vertebral artery diminutive with scant irregular flow within the right V2 segment. Right vertebral otherwise occluded within the neck. IMPRESSION: MRI HEAD IMPRESSION: 1. 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 2. Pronounced degenerative thickening at the tectorial membrane with resultant severe spinal stenosis at the cranial cervical junction and impingement upon the upper cervical spinal cord. No definite cord signal changes evident on this exam. 3. Underlying age-related cerebral atrophy. No other acute intracranial abnormality identified. MRA HEAD IMPRESSION: 1. 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 Dominant left vertebral artery widely patent to the vertebrobasilar junction. 2. 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 3. Severe near occlusive stenosis involving the proximal-mid left M1 segment, with additional downstream proximal left M2 stenosis. 4. Moderate diffuse atheromatous narrowing involving the supraclinoid ICAs bilaterally. MRA NECK IMPRESSION: 1. Right vertebral  artery largely occluded within the neck. Dominant  left vertebral artery widely patent with antegrade flow. 2. 50-60% atheromatous stenosis at the right carotid bifurcation/origin of the right ICA. Right carotid artery system otherwise patent. 3. No hemodynamically significant stenosis within the left carotid artery system within the neck. Electronically Signed   By: Jeannine Boga M.D.   On: 12/31/2018 18:39   Mr Jodene Nam Neck Wo Contrast  Result Date: 12/31/2018 CLINICAL DATA:  Initial evaluation for acute intermittent dizziness for 3 days. EXAM: MRI HEAD WITHOUT CONTRAST MRA HEAD WITHOUT CONTRAST MRA NECK WITHOUT CONTRAST TECHNIQUE: Multiplanar, multiecho pulse sequences of the brain and surrounding structures were obtained without intravenous contrast. Angiographic images of the Circle of Willis were obtained using MRA technique without intravenous contrast. Angiographic images of the neck were obtained using MRA technique without intravenous contrast. Carotid stenosis measurements (when applicable) are obtained utilizing NASCET criteria, using the distal internal carotid diameter as the denominator. COMPARISON:  None. FINDINGS: MRI HEAD FINDINGS Brain: Advanced age-related cerebral atrophy. Minimal T2/FLAIR hyperintensity within the periventricular white matter, most likely related to chronic microvascular ischemic disease, felt to be within normal limits for age. 6 mm focus of diffusion abnormality at the left occipital lobe compatible with a small acute ischemic infarct (series 3, image 18). No associated hemorrhage or mass effect. No other evidence for acute or subacute ischemia. Gray-white matter differentiation otherwise maintained. No encephalomalacia to suggest chronic cortical infarction. No foci of susceptibility artifact to suggest acute or chronic intracranial hemorrhage. No mass lesion, midline shift or mass effect. Diffuse ventricular prominence related to global parenchymal volume loss without  hydrocephalus. No extra-axial fluid collection. Pituitary gland normal. Vascular: Major intracranial vascular flow voids maintained. Skull and upper cervical spine: Prominent degenerative thickening at the tectorial membrane with resultant severe stenosis at the craniocervical junction and flattening of the right greater than left upper cervical spinal cord (series 13, image 6). No visible cord signal changes on this exam. Remainder the visualized cervical spine demonstrates no acute finding. Bone marrow signal intensity within normal limits. No scalp soft tissue abnormality. Sinuses/Orbits: Patient status post bilateral ocular lens replacement. Mild scattered mucoperiosteal thickening throughout the paranasal sinuses without evidence for acute sinusitis. No mastoid effusion. Inner ear structures normal. Other: None. MRA HEAD FINDINGS ANTERIOR CIRCULATION: Distal cervical segments of the internal carotid arteries are patent with symmetric antegrade flow. Petrous segments widely patent bilaterally. Cavernous segments widely patent bilaterally. Moderate narrowing at the supraclinoid segments bilaterally, right slightly greater than left. ICA termini perfused. A1 segment patent. Right A1 not visualized, which could be either hypoplastic and/or absent. Grossly normal anterior communicating artery. Anterior cerebral arteries mildly irregular but patent to their distal aspects without significant stenosis. Mild atheromatous irregularity within the right M1 segment without high-grade stenosis. Normal right MCA bifurcation. Distal right MCA branches well perfused. On the left, there is a severe near occlusive stenosis involving the proximal-mid left M1 segment, measuring approximately 7 mm in length (series 404, image 11). Left M1 narrowed but patent distally. Severe stenosis at the proximal of the left M2 inferior division. Left MCA branches otherwise perfused distally. POSTERIOR CIRCULATION: Left vertebral artery mildly  irregular but patent to the vertebrobasilar junction without high-grade stenosis. Patent left PICA. Right vertebral artery occluded at the skull base. Some retrograde filling into the distal right V4 segment across the vertebrobasilar junction. Right PICA not visualized. Moderate to severe segmental stenoses involving the proximal and distal basilar artery (series 406, image 11). Superior cerebral arteries grossly patent bilaterally. Both of the PCAs primarily supplied via  the basilar. Additional multifocal severe segmental stenoses involving the proximal and mid P2 segments seen bilaterally. PCAs are perfused to their distal aspects. MRA NECK FINDINGS Examination technically limited by lack of IV contrast. Partially visualized aortic arch within normal limits for caliber with normal 3 vessel morphology. No appreciable flow-limiting stenosis about the origin of the great vessels. Partially visualized subclavian arteries patent bilaterally. Right CCA patent from its origin to the bifurcation without obvious flow-limiting stenosis. Atheromatous plaque about the right bifurcation with associated stenosis of approximately 50-60% by NASCET criteria. Right ICA otherwise patent distally. Left CCA tortuous proximally but grossly patent to the bifurcation without appreciable flow-limiting stenosis. No significant atheromatous narrowing about the left bifurcation. Left ICA mildly tortuous but otherwise patent. Both of the vertebral arteries arise from the subclavian arteries. Left vertebral artery dominant and widely patent within the neck. Right vertebral artery diminutive with scant irregular flow within the right V2 segment. Right vertebral otherwise occluded within the neck. IMPRESSION: MRI HEAD IMPRESSION: 1. 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 2. Pronounced degenerative thickening at the tectorial membrane with resultant severe spinal stenosis at the cranial cervical junction  and impingement upon the upper cervical spinal cord. No definite cord signal changes evident on this exam. 3. Underlying age-related cerebral atrophy. No other acute intracranial abnormality identified. MRA HEAD IMPRESSION: 1. 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 Dominant left vertebral artery widely patent to the vertebrobasilar junction. 2. 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 3. Severe near occlusive stenosis involving the proximal-mid left M1 segment, with additional downstream proximal left M2 stenosis. 4. Moderate diffuse atheromatous narrowing involving the supraclinoid ICAs bilaterally. MRA NECK IMPRESSION: 1. 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 Dominant left vertebral artery widely patent with antegrade flow. 2. 50-60% atheromatous stenosis at the right carotid bifurcation/origin of the right ICA. Right carotid artery system otherwise patent. 3. No hemodynamically significant stenosis within the left carotid artery system within the neck. Electronically Signed   By: Jeannine Boga M.D.   On: 12/31/2018 18:39   Mr Brain Wo Contrast  Result Date: 12/31/2018 CLINICAL DATA:  Initial evaluation for acute intermittent dizziness for 3 days. EXAM: MRI HEAD WITHOUT CONTRAST MRA HEAD WITHOUT CONTRAST MRA NECK WITHOUT CONTRAST TECHNIQUE: Multiplanar, multiecho pulse sequences of the brain and surrounding structures were obtained without intravenous contrast. Angiographic images of the Circle of Willis were obtained using MRA technique without intravenous contrast. Angiographic images of the neck were obtained using MRA technique without intravenous contrast. Carotid stenosis measurements (when applicable) are obtained utilizing NASCET criteria, using the distal internal carotid diameter as the denominator. COMPARISON:  None. FINDINGS: MRI HEAD FINDINGS Brain:  Advanced age-related cerebral atrophy. Minimal T2/FLAIR hyperintensity within the periventricular white matter, most likely related to chronic microvascular ischemic disease, felt to be within normal limits for age. 6 mm focus of diffusion abnormality at the left occipital lobe compatible with a small acute ischemic infarct (series 3, image 18). No associated hemorrhage or mass effect. No other evidence for acute or subacute ischemia. Gray-white matter differentiation otherwise maintained. No encephalomalacia to suggest chronic cortical infarction. No foci of susceptibility artifact to suggest acute or chronic intracranial hemorrhage. No mass lesion, midline shift or mass effect. Diffuse ventricular prominence related to global parenchymal volume loss without hydrocephalus. No extra-axial fluid collection. Pituitary gland normal. Vascular: Major intracranial vascular flow voids maintained. Skull and  upper cervical spine: Prominent degenerative thickening at the tectorial membrane with resultant severe stenosis at the craniocervical junction and flattening of the right greater than left upper cervical spinal cord (series 13, image 6). No visible cord signal changes on this exam. Remainder the visualized cervical spine demonstrates no acute finding. Bone marrow signal intensity within normal limits. No scalp soft tissue abnormality. Sinuses/Orbits: Patient status post bilateral ocular lens replacement. Mild scattered mucoperiosteal thickening throughout the paranasal sinuses without evidence for acute sinusitis. No mastoid effusion. Inner ear structures normal. Other: None. MRA HEAD FINDINGS ANTERIOR CIRCULATION: Distal cervical segments of the internal carotid arteries are patent with symmetric antegrade flow. Petrous segments widely patent bilaterally. Cavernous segments widely patent bilaterally. Moderate narrowing at the supraclinoid segments bilaterally, right slightly greater than left. ICA termini perfused. A1  segment patent. Right A1 not visualized, which could be either hypoplastic and/or absent. Grossly normal anterior communicating artery. Anterior cerebral arteries mildly irregular but patent to their distal aspects without significant stenosis. Mild atheromatous irregularity within the right M1 segment without high-grade stenosis. Normal right MCA bifurcation. Distal right MCA branches well perfused. On the left, there is a severe near occlusive stenosis involving the proximal-mid left M1 segment, measuring approximately 7 mm in length (series 404, image 11). Left M1 narrowed but patent distally. Severe stenosis at the proximal of the left M2 inferior division. Left MCA branches otherwise perfused distally. POSTERIOR CIRCULATION: Left vertebral artery mildly irregular but patent to the vertebrobasilar junction without high-grade stenosis. Patent left PICA. Right vertebral artery occluded at the skull base. Some retrograde filling into the distal right V4 segment across the vertebrobasilar junction. Right PICA not visualized. Moderate to severe segmental stenoses involving the proximal and distal basilar artery (series 406, image 11). Superior cerebral arteries grossly patent bilaterally. Both of the PCAs primarily supplied via the basilar. Additional multifocal severe segmental stenoses involving the proximal and mid P2 segments seen bilaterally. PCAs are perfused to their distal aspects. MRA NECK FINDINGS Examination technically limited by lack of IV contrast. Partially visualized aortic arch within normal limits for caliber with normal 3 vessel morphology. No appreciable flow-limiting stenosis about the origin of the great vessels. Partially visualized subclavian arteries patent bilaterally. Right CCA patent from its origin to the bifurcation without obvious flow-limiting stenosis. Atheromatous plaque about the right bifurcation with associated stenosis of approximately 50-60% by NASCET criteria. Right ICA  otherwise patent distally. Left CCA tortuous proximally but grossly patent to the bifurcation without appreciable flow-limiting stenosis. No significant atheromatous narrowing about the left bifurcation. Left ICA mildly tortuous but otherwise patent. Both of the vertebral arteries arise from the subclavian arteries. Left vertebral artery dominant and widely patent within the neck. Right vertebral artery diminutive with scant irregular flow within the right V2 segment. Right vertebral otherwise occluded within the neck. IMPRESSION: MRI HEAD IMPRESSION: 1. 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 2. Pronounced degenerative thickening at the tectorial membrane with resultant severe spinal stenosis at the cranial cervical junction and impingement upon the upper cervical spinal cord. No definite cord signal changes evident on this exam. 3. Underlying age-related cerebral atrophy. No other acute intracranial abnormality identified. MRA HEAD IMPRESSION: 1. 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 Dominant left vertebral artery widely patent to the vertebrobasilar junction. 2. 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 3. Severe near occlusive stenosis involving the proximal-mid left M1 segment, with  additional downstream proximal left M2 stenosis. 4. Moderate diffuse atheromatous narrowing involving the supraclinoid ICAs bilaterally. MRA NECK IMPRESSION: 1. 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 Dominant left vertebral artery widely patent with antegrade flow. 2. 50-60% atheromatous stenosis at the right carotid bifurcation/origin of the right ICA. Right carotid artery system otherwise patent. 3. No hemodynamically significant stenosis within the left carotid artery system within the neck. Electronically Signed   By: Jeannine Boga M.D.   On:  12/31/2018 18:39    PHYSICAL EXAM Constitutional: Appears well-developed and well-nourished.  Psych: Affect appropriate to situation Eyes: No scleral injection HENT: No OP obstrucion Head: Normocephalic.  Cardiovascular: Normal rate and regular rhythm.  Respiratory: Effort normal, non-labored breathing Skin: WDI  Neuro: Mental Status: Patient is awake, alert, oriented to person, place, month, year, and situation. Patient is able to give a clear and coherent history.  He has poor short-term memory and recall. He perseverates on topic he is focused on. No signs of aphasia or neglect Cranial Nerves: II: Visual Fields are full OD III,IV, VI: EOMI without ptosis or diploplia.  Right pupil , round, and reactive to light.  Left pupil is approximately 5 mm, ireregular (was hit by a golf ball) and not reactive to light V: Facial sensation is symmetric to temperature VII: Facial movement is symmetric.  VIII: hearing is intact to voice X: Uvula elevates symmetrically XI: Shoulder shrug is symmetric. XII: tongue is midline without atrophy or fasciculations.  Motor: Tone is normal. Bulk is normal. 5/5 strength was present in all four extremities. Sensory: Sensation is symmetric to light touch and temperature in the arms and legs. Deep Tendon Reflexes: 1+ in bilateral upper extremities with no knee jerk or ankle jerk Plantars: Toes are downgoing bilaterally.  Cerebellar: FNF and HKS are intact bilaterally   ASSESSMENT/PLAN [name] is a 85 y.o. male with history of TIA, SOB, palpitations, HLD, hypothyroidism, PVC, CHF presenting with vertigo and dizziness x 2 days. Brought to hospital following fall. BP elevated on arrival.  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 Thickening tectorial membrane w/ severe spinal stenosis w/ cord impingement but cord signal normal  MRA neck  R  VA occluded in neck. R ICA 50-60% bifurcation/origin.  MRA brain  R VA occluded in neck. multifocual mod to severe stenosis VA and B P2 segments. Near occlusive stenosis Prox mid L M1 & M2. Mod atheromatous narrowing B ICA supraclinoid   2D Echo  pending   EEG pending   LDL 177  HgbA1c 6.2  SCDs for VTE prophylaxis  clopidogrel 75 mg daily prior to admission, now on aspirin 325 mg daily and clopidogrel 75 mg daily. Continue DAPT x 3 months given large vessel disease  Therapy recommendations:  CIR. Consult in place  Disposition:  pending   Followed by Dr. Delice Lesch PTA - see below prior stroke - will f/u with her at d/c  Hypertension  BP low on EMS arrival; 100/70 HR 60s. Elevated on arrival here 201/97.  BP low this am 91/56   Home meds:  Lasix 20, metoprolol 25 . Permissive hypertension (OK if < 220/120) but gradually normalize in 5-7 days . Long-term BP goal 130-150 given intracranial stenosis  Hyperlipidemia  Home meds:  No statin  lipitor 80 added on arrival  LDL 177, goal < 70  Has refused statins in the past. We discussed. He  will consider.  Continue statin at discharge  Other Stroke Risk Factors  Advanced age  Former Cigarette smoker, quit 65 yrs ago  ETOH use, advised to drink no more than 2 drink(s) a day  Hx stroke/TIA/"spells" - followed by Dr. Delice Lesch  2013 neg MRI and EEG  01/2015 slumped to R. EEG normal. MRI punctate cortical infarct posterior L parietal love  09/2015 B leg weakness slurred speech. MRI sign IC ant and post circ  06/26/2016 loss of consciousness VB TIA  05/2018 memory loss, difficulty completing complex tasks c/w mild dementia  Chronic diastolic Congestive heart failure  Other Active Problems  Blind L eye  RA  Cord compression w/o signal change. Consider OP NS follow up. Pt likely not a surgical candidate - age and refusal  Hospital day # 0  Burnetta Sabin, MSN, APRN, ANVP-BC, AGPCNP-BC Advanced Practice Stroke  Nurse Bernie for Schedule & Pager information 01/01/2019 11:00 AM   To contact Stroke Continuity provider, please refer to [email or web address] After hours, contact General Neurology

## 2019-01-01 NOTE — Care Management Note (Signed)
Case Management Note  Patient Details  Name: Robert Rowe MRN: [id number] Date of Birth: 02/09/1918  Subjective/Objective:   Pt admitted with a stroke. He is from home with his wife.  DME: walker No issues with medications Wife able to provide transportation.                Action/Plan: PT recommending CIR. Awaiting OT eval. CM following for d/c disposition.  Expected Discharge Date:                  Expected Discharge Plan:  [place]  In-House Referral:     Discharge planning Services     Post Acute Care Choice:    Choice offered to:     DME Arranged:    DME Agency:     HH Arranged:    [place] Agency:     Status of Service:  In process, will continue to follow  If discussed at Long Length of Stay Meetings, dates discussed:    Additional Comments:  Pollie Friar, RN 01/01/2019, 12:51 PM

## 2019-01-01 NOTE — Progress Notes (Signed)
  Echocardiogram 2D Echocardiogram has been performed.  Adelina Collard L Androw 01/01/2019, 11:15 AM

## 2019-01-02 DIAGNOSIS — H8110 Benign paroxysmal vertigo, unspecified ear: Secondary | ICD-10-CM

## 2019-01-02 LAB — BASIC METABOLIC PANEL
Anion gap: 7 (ref 5–15)
BUN: 17 mg/dL (ref 8–23)
CO2: 27 mmol/L (ref 22–32)
Calcium: 8.9 mg/dL (ref 8.9–10.3)
Chloride: 108 mmol/L (ref 98–111)
Creatinine, Ser: 1.53 mg/dL — ABNORMAL HIGH (ref 0.61–1.24)
GFR calc Af Amer: 43 mL/min — ABNORMAL LOW (ref >60)
GFR calc non Af Amer: 37 mL/min — ABNORMAL LOW (ref >60)
Glucose, Bld: 123 mg/dL — ABNORMAL HIGH (ref 70–99)
Potassium: 4.1 mmol/L (ref 3.5–5.1)
Sodium: 142 mmol/L (ref 135–145)

## 2019-01-02 MED ORDER — METOPROLOL SUCCINATE ER 25 MG PO TB24
25.0000 mg | ORAL_TABLET | Freq: Every day | ORAL | Status: DC
  Administered 2019-01-02: 25 mg via ORAL
  Filled 2019-01-02 (×2): qty 1

## 2019-01-02 NOTE — H&P (Signed)
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 Presented to 12/31/2018 with vertigo as well as limited hearing to the right ear 2-3 days. Patient had made plans to see ENT. There were reports of a fall in the bathroom with no loss of consciousness. Upon arrival patient blood pressure 201/97. Patient has been followed by Dr. Delice Lesch neurology services for the past 6 months 4 episodes of syncope/BPPV as well as bilateral leg weakness and slurred speech. MRA of the head and neck showed intracranial stenosis with significant intracranial stenosis in basilar, right ICA cavernous and supraclinoid junction, moderate narrowing bilateral ACA and right ACA, distal right vertebral and bilateral PCA stenosis. Recurrent symptoms concerning for hypoperfusion causing TIA. Patient was maintained on Plavix. 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 He lives with spouse reportedly independent prior to admission he did use a rolling walker outside the home. MRI of the brain showed a 6 mm acute ischemic nonhemorrhagic infarction of the left occipital lobe. Echocardiogram with ejection fraction of 65% no evidence of left ventricular regional wall motion abnormalities. EEG was negative for seizure. Patient is currently maintained on aspirin 325 mg and Plavix for CVA prophylaxis for 3 months and then either aspirin  or Plavix alone. Tolerating a regular diet. Therapy evaluations completed with recommendations of physical medicine rehabilitation consult. Patient was admitted for a comprehensive rehabilitation program.  Review of Systems  Constitutional: Negative for chills and fever.  HENT: Positive for hearing loss.   Eyes: Positive for blurred vision.  Respiratory: Positive for shortness of breath.   Cardiovascular:  Positive for palpitations and leg swelling.  Gastrointestinal: Positive for constipation. Negative for nausea and vomiting.  Genitourinary: Positive for urgency. Negative for dysuria and hematuria.  Musculoskeletal: Positive for myalgias.  Skin: Negative for rash.  Neurological: Positive for dizziness.       Vertigo  All other systems reviewed and are negative.  Past Medical History:  Diagnosis Date  . BENIGN PROSTATIC HYPERTROPHY 04/05/2007  . 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 CHEST DISCOMFORT 04/25/2009  . Chronic diastolic CHF (congestive heart failure) ([place])    a. Echo 12/16: mild LVH, EF 55-60%, no RWMA, Gr 1 DD, mild AI, MAC, mild LAE, normal RVF, PASP 37 mmHg  . Chronic kidney disease    chronic   . DJD (degenerative joint disease)   . Dysrhythmia    pvc's   . GERD 12/03/2007  . Hiatal hernia   . History of nuclear stress test    Myoview 1/17: EF 53%, normal perfusion; Low Risk  . HYPERLIPIDEMIA 04/05/2007  . Hypothyroidism   . Osteoarth NOS-Unspec 04/05/2007  . Palpitations 02/12/2010  . PEDAL EDEMA 05/01/2008  . Rheumatoid arthritis(714.0) 04/05/2007  . Shortness of breath dyspnea   . TRANSIENT ISCHEMIC ATTACK, HX OF 04/05/2007   ?    Past Surgical History:  Procedure Laterality Date  . BALLOON DILATION N/A 03/12/2013   Procedure: BALLOON DILATION;  Surgeon: Inda Castle, MD;  Location: [facility];  Service: Endoscopy;  Laterality: N/A;  . CARPAL TUNNEL RELEASE  11/28/2018   Left hand  . CATARACT EXTRACTION  2001   bilateral  . CYSTOSCOPY WITH URETHRAL DILATATION N/A 09/24/2015   Procedure: CYSTOSCOPY WITH COOK BALLOON DILATATION OF URETHRAL STRICTURE ;  Surgeon: Carolan Clines, MD;  Location:  [facility];  Service: Urology;  Laterality: N/A;  . CYSTOSCOPY WITH URETHRAL DILATATION N/A 07/20/2016   Procedure: CYSTOSCOPY WITH URETHRAL DILATATION;  Surgeon: Alexis Frock, MD;  Location: [facility];  Service: Urology;  Laterality: N/A;  1 HOUR  .  ESOPHAGOGASTRODUODENOSCOPY N/A 03/12/2013   Procedure: ESOPHAGOGASTRODUODENOSCOPY (EGD);  Surgeon: Inda Castle, MD;  Location: [facility];  Service: Endoscopy;  Laterality: N/A;  . HEMORRHOID SURGERY    . HERNIA REPAIR     inguinal x5  . KNEE SURGERY  arthroscopic   left  . PROSTATE CRYOABLATION     2000  . SHOULDER SURGERY  few years ago   left  . TRANSURETHRAL RESECTION OF PROSTATE N/A 03/19/2015   Procedure: TRANSURETHRAL RESECTION OF THE PROSTATE (TURP);  Surgeon: Carolan Clines, MD;  Location: [facility];  Service: Urology;  Laterality: N/A;   Family History  Problem Relation Age of Onset  . Breast cancer Mother   . Heart disease Father   . Heart attack Father   . Lymphoma Brother   . Stroke Neg Hx   . Hypertension Neg Hx    Social History:  reports that he quit smoking about 65 years ago. His smoking use included cigarettes. He has never used smokeless tobacco. He reports current alcohol use. He reports that he does not use drugs. Allergies:  Allergies  Allergen Reactions  . Codeine Other (See Comments)    Patient was "all over the place"- undesired side effect  . Sulfasalazine Other (See Comments)    Reaction not recalled  . Sulfonamide Derivatives Other (See Comments)    Reaction not recalled   Medications Prior to Admission  Medication Sig Dispense Refill  . acetaminophen (TYLENOL) 500 MG tablet Take 1,000 mg by mouth daily after breakfast.    . clopidogrel (PLAVIX) 75 MG tablet TAKE 1 TABLET BY MOUTH  DAILY (Patient taking differently: Take 75 mg by mouth daily. ) 90 tablet 1  . furosemide (LASIX) 20 MG tablet TAKE 1 TABLET BY MOUTH  DAILY AS NEEDED FOR FLUID  OR EDEMA. (Patient taking differently: Take 20-40 mg by mouth daily as needed (for fluid retention in the legs). ) 90 tablet 3  . latanoprost (XALATAN) 0.005 % ophthalmic solution Place 1 drop into both eyes at bedtime.   99  . levothyroxine (SYNTHROID, LEVOTHROID) 50 MCG tablet TAKE 1 TABLET BY MOUTH   DAILY (Patient taking differently: Take 50 mcg by mouth daily before breakfast. ) 90 tablet 2  . metoprolol succinate (TOPROL-XL) 25 MG 24 hr tablet TAKE 1 TABLET BY MOUTH AT  BEDTIME (Patient taking differently: Take 25 mg by mouth at bedtime. ) 90 tablet 1  . Multiple Vitamin (MULTIVITAMIN WITH MINERALS) TABS tablet Take 1 tablet by mouth 2 (two) times a week.     . nitroGLYCERIN (NITROSTAT) 0.4 MG SL tablet Place 0.4 mg under the tongue every 5 (five) minutes as needed (for Esophageal spasms).     . pantoprazole (PROTONIX) 40 MG tablet TAKE 1 TABLET BY MOUTH  DAILY (Patient taking differently: Take 40 mg by mouth daily before breakfast. ) 90 tablet 1  . albuterol (PROVENTIL HFA;VENTOLIN HFA) 108 (90 Base) MCG/ACT inhaler Inhale 2 puffs into the lungs every 4 (four) hours as needed for wheezing or shortness of breath. (Patient not taking: Reported on 12/31/2018) 1 Inhaler 0    Drug Regimen Review  Drug regimen was reviewed and remains appropriate with no significant issues identified  Home: Home Living Family/patient expects to be discharged to::  Private residence Living Arrangements: Spouse/significant other Available Help at Discharge: Family, Available 24 hours/day Type of Home: House Home Access: Stairs to enter [employer] of Steps: 4 Entrance Stairs-Rails: Right Home Layout: Two level(chair lift) Alternate Level Stairs-Number of Steps: flight Bathroom Shower/Tub: Chiropodist: Standard Home Equipment: Environmental consultant - 2 wheels  Lives With: Spouse   Functional History: Prior Function Level of Independence: Independent Comments: ADLs and simple IADLs. used no AD in house, used RW occasionally outside of home  Functional Status:  Mobility: Bed Mobility General bed mobility comments: pt sitting EOB upon arrival Transfers Overall transfer level: Needs assistance Equipment used: Rolling walker (2 wheeled) Transfers: Sit to/from Stand Sit to Stand: Min  guard Stand pivot transfers: Mod assist General transfer comment: min guard for safety; pt pulling up on RW with bilateral UEs despite tactile and verbal cueing for safe hand placement Ambulation/Gait Ambulation/Gait assistance: Min assist, Min guard Gait Distance (Feet): 15 Feet Assistive device: Rolling walker (2 wheeled) Gait Pattern/deviations: Step-to pattern, Step-through pattern, Decreased step length - right, Decreased step length - left, Decreased stride length, Trunk flexed General Gait Details: pt with mild instability with ambulation, limited secondary to fatigue and weakness. Pt ambulated within room with min guard to min A level for safety and stability Gait velocity: decreased    ADL: ADL Overall ADL's : Needs assistance/impaired Eating/Feeding: Supervision/ safety, Set up, Sitting Grooming: Wash/dry face, Min guard, Standing, Minimal assistance Upper Body Bathing: Min guard, Sitting Lower Body Bathing: Minimal assistance, +2 for safety/equipment, Sit to/from stand Lower Body Bathing Details (indicate cue type and reason): Pt performing peri care while at sink and required Min A for balance and gown management. Upper Body Dressing : Min guard, Sitting Upper Body Dressing Details (indicate cue type and reason): Donning second gown like a jacket Lower Body Dressing: Minimal assistance, Sit to/from stand Lower Body Dressing Details (indicate cue type and reason): Pt able to bend forward to adjust socks but became dizzy Toilet Transfer: Minimal assistance, Ambulation, RW(simulated to recliner) Toileting - Clothing Manipulation Details (indicate cue type and reason): Pt performing peri care while at sink and required Min A for balance and gown management. Functional mobility during ADLs: Minimal assistance, Rolling walker General ADL Comments: Pt presenting with dizziness, decreased strength, poor balance, and decreased ST memory.  Cognition: Cognition Overall Cognitive  Status: Impaired/Different from baseline Arousal/Alertness: Awake/alert Orientation Level: Oriented X4 Attention: Focused Focused Attention: Appears intact Memory: Impaired Memory Impairment: Retrieval deficit Awareness: Appears intact Problem Solving: Appears intact Executive Function: Reasoning Reasoning: Appears intact Safety/Judgment: Appears intact Cognition Arousal/Alertness: Awake/alert Behavior During Therapy: WFL for tasks assessed/performed Overall Cognitive Status: Impaired/Different from baseline Area of Impairment: Memory Memory: Decreased short-term memory, Decreased recall of precautions General Comments: pt repeatedly stating that he can't remember anything (which is not his baseline cognition)  Physical Exam: Blood pressure 131/84, pulse 88, temperature (!) 97.4 F (36.3 C), temperature source Oral, resp. rate 17, height _0  (1.575 m), weight 65.8 kg, SpO2 100 %. Physical Exam  HENT:  Head: Normocephalic.  Eyes: EOM are normal.  Neck: Normal range of motion. Neck supple. No thyromegaly present.  Cardiovascular: Normal rate and regular rhythm.  Respiratory: Effort normal and breath sounds normal. No respiratory distress.  GI: Soft. Bowel sounds are normal. He exhibits no distension.  Neurological:  Alert male sitting at edge of bed. He appears appropriate. Provides his name, age and date of birth. Follows full commands  Strength is 5/5 bilateral deltoid bicep tricep  grip hip flexor knee extensor ankle dorsiflexor Sensation intact light touch bilateral upper and lower limb No evidence of dysmetria finger-nose-finger testing Visual fields are intact confrontation testing Cranial nerves II through XII are intact No pain with upper extremity or lower extremity range of motion He has decreased range of motion at the knees has bilateral knee contractures. Patient complains of dizziness when turning his head side to side Results for orders placed or performed  during the hospital encounter of 12/31/18 (from the past 48 hour(s))  Basic metabolic panel     Status: Abnormal   Collection Time: 01/02/19  5:40 AM  Result Value Ref Range   Sodium 142 135 - 145 mmol/L   Potassium 4.1 3.5 - 5.1 mmol/L   Chloride 108 98 - 111 mmol/L   CO2 27 22 - 32 mmol/L   Glucose, Bld 123 (H) 70 - 99 mg/dL   BUN 17 8 - 23 mg/dL   Creatinine, Ser 1.53 (H) 0.61 - 1.24 mg/dL   Calcium 8.9 8.9 - 10.3 mg/dL   GFR calc non Af Amer 37 (L) >60 mL/min   GFR calc Af Amer 43 (L) >60 mL/min   Anion gap 7 5 - 15    Comment: Performed at [facility], [place] [address] [place], [place] [address]   No results found.     Medical Problem List and Plan: 1.  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  DVT Prophylaxis/Anticoagulation: SCDs. Monitor for any signs of DVT 3. Pain Management:  Tylenol as needed 4. Mood:  Provide emotional support 5. Neuropsych: This patient is capable of making decisions on his own behalf. 6. Skin/Wound Care: routine skin checks 7. Fluids/Electrolytes/Nutrition:  Routine in and out's with follow-up chemistries, monitor meal intake 8.  Chronic diastolic congestive heart failure. Monitor for any signs of fluid overload. Patient on Lasix 20-40 mg as needed prior to admission. Resume as needed 9. CKD. Creatinine baseline 1.56-1.70 10. Hypertension. Toprol-XL 25 mg daily, monitor vitals q shift 11. Hypothyroidism.  TSH 2.124. Continue Synthroid 12. Hyperlipidemia. Lipitor Post Admission Physician Evaluation: 1. Functional deficits secondary  to RIght occipital infarct. 2. Patient admitted to receive collaborative, interdisciplinary care between the physiatrist, rehab nursing staff, and therapy team. 3. Patient's level of medical complexity and substantial therapy needs in context of that medical necessity cannot be provided at a lesser intensity of  care. 4. Patient has experienced substantial functional loss from his/her baseline. Judging by the patient's diagnosis, physical exam, and functional history, the patient has potential for functional progress which will result in measurable gains while on inpatient rehab.  These gains will be of substantial and practical use upon discharge in facilitating mobility and self-care at the household level. 5. Physiatrist will provide 24 hour management of medical needs as well as oversight of the therapy plan/treatment and provide guidance as appropriate regarding the interaction of the two. 6. 24 hour rehab nursing will assist in the management of  bladder management, bowel management, safety, skin/wound care, disease management, medication administration, pain management and patient education  and help integrate therapy concepts, techniques,education, etc. 7. PT will assess and treat for: DME instruction, Gait training, Stair training, Functional mobility training, Therapeutic activities, Therapeutic exercise, Balance training, Neuromuscular re-education, Cognitive remediation, Patient/family education.  Goals are: independent with assistive device. 8. OT will assess and treat for Self-care/ADL training, Therapeutic exercise, Energy conservation, DME and/or AE instruction, Therapeutic activities, Patient/family education.  Goals are: independent with increased time.  9. SLP will assess and treat for  .  Goals are: N/A. 10. Case Management and Social Worker will assess and treat for psychological issues and discharge planning. 11. Team conference will be held weekly to assess progress toward goals and to determine barriers to discharge. 12.  Patient will receive at least 3 hours of therapy per day at least 5 days per week. 13. ELOS and Prognosis: 7-10d excellent    "I have personally performed a face to face diagnostic evaluation of this patient.  Additionally, I have reviewed and concur with the physician  assistant's documentation above." Charlett Blake M.D. [facility] FAAPM&R (Sports Med, Neuromuscular Med) Diplomate Am Board of Electrodiagnostic Med  Elizabeth Sauer 01/03/2019

## 2019-01-02 NOTE — Evaluation (Signed)
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 with medical history significant of hypothyroidism, GERD, chronic diastolic CHF HTN, CKD. 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   Clinical Impression   PTA, pt was living with his wife and was independent with use of cane or RW for mobility. Pt currently requiring Min A for LB ADLs and functional mobility with RW. Pt presenting with dizziness, decreased balance, ST memory deficits, and poor strength. Pt highly motivated to participate in therapy. Pt would benefit from further acute OT to facilitate safe dc. Recommend dc to CIR for further OT to optimize safety, independence with ADLs, and return to PLOF.      Follow Up Recommendations  CIR;Supervision/Assistance - 24 hour    Equipment Recommendations  Other (comment)(Defer to next venue)    Recommendations for Other Services       Precautions / Restrictions Precautions Precautions: Fall Restrictions Weight Bearing Restrictions: No      Mobility Bed Mobility               General bed mobility comments: pt sitting EOB upon arrival  Transfers Overall transfer level: Needs assistance Equipment used: Rolling walker (2 wheeled) Transfers: Sit to/from Stand Sit to Stand: Min guard         General transfer comment: min guard for safety; pt pulling up on RW with bilateral UEs despite tactile and verbal cueing for safe hand placement    Balance Overall balance assessment: Needs assistance Sitting-balance support: Feet supported Sitting balance-Leahy Scale: Fair Sitting balance - Comments: pt able to sit EOB with min guard and adjust socks without difficulties   Standing balance support: Bilateral upper extremity supported;Single extremity supported Standing balance-Leahy Scale:  Poor Standing balance comment: pt able to stand and sink and perform ADLs tasks with close min guard and occasional min A with 1-2 UE supports                           ADL either performed or assessed with clinical judgement   ADL Overall ADL's : Needs assistance/impaired Eating/Feeding: Supervision/ safety;Set up;Sitting   Grooming: Wash/dry face;Min guard;Standing;Minimal assistance   Upper Body Bathing: Min guard;Sitting   Lower Body Bathing: Minimal assistance;+2 for safety/equipment;Sit to/from stand Lower Body Bathing Details (indicate cue type and reason): Pt performing peri care while at sink and required Min A for balance and gown management. Upper Body Dressing : Min guard;Sitting Upper Body Dressing Details (indicate cue type and reason): Donning second gown like a jacket Lower Body Dressing: Minimal assistance;Sit to/from stand Lower Body Dressing Details (indicate cue type and reason): Pt able to bend forward to adjust socks but became dizzy Toilet Transfer: Minimal assistance;Ambulation;RW(simulated to recliner)     Toileting - Clothing Manipulation Details (indicate cue type and reason): Pt performing peri care while at sink and required Min A for balance and gown management.     Functional mobility during ADLs: Minimal assistance;Rolling walker General ADL Comments: Pt presenting with dizziness, decreased strength, poor balance, and decreased ST memory.     Vision Baseline Vision/History: Wears glasses Wears Glasses: At all times Patient Visual Report: No change from baseline;Other (comment)(Blurry vision in left eye from accident when he was young) Additional Comments: Pt denies any diplopia or blurry vision  Perception     Praxis      Pertinent Vitals/Pain Pain Assessment: No/denies pain     Hand Dominance Right   Extremity/Trunk Assessment Upper Extremity Assessment Upper Extremity Assessment: Generalized weakness   Lower Extremity  Assessment Lower Extremity Assessment: Defer to PT evaluation   Cervical / Trunk Assessment Cervical / Trunk Assessment: Kyphotic   Communication Communication Communication: No difficulties(suddenly lost hearing in L ear ~1 mo ago)   Cognition Arousal/Alertness: Awake/alert Behavior During Therapy: WFL for tasks assessed/performed Overall Cognitive Status: Impaired/Different from baseline Area of Impairment: Memory                     Memory: Decreased short-term memory;Decreased recall of precautions         General Comments: pt repeatedly stating that he can't remember anything (which is not his baseline cognition)   General Comments  VSS    Exercises     Shoulder Instructions      Home Living Family/patient expects to be discharged to:: Private residence Living Arrangements: Spouse/significant other Available Help at Discharge: Family;Available 24 hours/day Type of Home: House Home Access: Stairs to enter [employer] of Steps: 4 Entrance Stairs-Rails: Right Home Layout: Two level(chair lift) Alternate Level Stairs-Number of Steps: flight   Bathroom Shower/Tub: Teacher, early years/pre: Standard     Home Equipment: Environmental consultant - 2 wheels      Lives With: Spouse    Prior Functioning/Environment Level of Independence: Independent        Comments: ADLs and simple IADLs. used no AD in house, used RW occasionally outside of home        OT Problem List: Decreased strength;Decreased activity tolerance;Decreased range of motion;Impaired balance (sitting and/or standing);Decreased knowledge of use of DME or AE;Decreased knowledge of precautions;Decreased cognition      OT Treatment/Interventions: Self-care/ADL training;Therapeutic exercise;Energy conservation;DME and/or AE instruction;Therapeutic activities;Patient/family education    OT Goals(Current goals can be found in the care plan section) Acute Rehab OT Goals Patient Stated  Goal: return home OT Goal Formulation: With patient Time For Goal Achievement: 01/16/19 Potential to Achieve Goals: Good  OT Frequency: Min 2X/week   Barriers to D/C:            Co-evaluation PT/OT/SLP Co-Evaluation/Treatment: Yes Reason for Co-Treatment: For patient/therapist safety;To address functional/ADL transfers PT goals addressed during session: Balance;Mobility/safety with mobility;Proper use of DME;Strengthening/ROM OT goals addressed during session: ADL's and self-care      AM-PAC OT "6 Clicks" Daily Activity     Outcome Measure Help from another person eating meals?: None Help from another person taking care of personal grooming?: A Little Help from another person toileting, which includes using toliet, bedpan, or urinal?: A Little Help from another person bathing (including washing, rinsing, drying)?: A Little Help from another person to put on and taking off regular upper body clothing?: A Little Help from another person to put on and taking off regular lower body clothing?: A Little 6 Click Score: 19   End of Session Equipment Utilized During Treatment: Rolling walker;Gait belt Nurse Communication: Mobility status  Activity Tolerance: Patient tolerated treatment well Patient left: in chair;with call bell/phone within reach;with chair alarm set  OT Visit Diagnosis: Unsteadiness on feet (R26.81);Other abnormalities of gait and mobility (R26.89);Muscle weakness (generalized) (M62.81);Other symptoms and signs involving cognitive function                Time: 5449-2010 OT Time Calculation (min): 29 min Charges:  OT General Charges $  OT Visit: 1 Visit OT Evaluation $OT Eval Moderate Complexity: 1 Mod Lajoya Dombek MSOT, OTR/L Acute Rehab Pager: [phone] Office: [place] 01/02/2019, 11:06 AM

## 2019-01-02 NOTE — Progress Notes (Signed)
IP rehab admissions - I met with patient and we spoke with his wife by speaker phone.  They are in agreement to inpatient rehab admission.  I have opened the case and have faxed information requesting acute inpatient rehab admission.  Will await call back from insurance case manager.  Call me for questions.  [phone]

## 2019-01-02 NOTE — H&P (View-Only) (Signed)
  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 Presented to 12/31/2018 with vertigo as well as limited hearing to the right ear 2-3 days. Patient had made plans to see ENT. There were reports of a fall in the bathroom with no loss of consciousness. Upon arrival patient blood pressure 201/97. Patient has been followed by Dr. Aquino neurology services for the past 6 months 4 episodes of syncope/BPPV as well as bilateral leg weakness and slurred speech. MRA of the head and neck showed intracranial stenosis with significant intracranial stenosis in basilar, right ICA cavernous and supraclinoid junction, moderate narrowing bilateral ACA and right ACA, distal right vertebral and bilateral PCA stenosis. Recurrent symptoms concerning for hypoperfusion causing TIA. Patient was maintained on Plavix. 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 He lives with spouse reportedly independent prior to admission he did use a rolling walker outside the home. MRI of the brain showed a 6 mm acute ischemic nonhemorrhagic infarction of the left occipital lobe. Echocardiogram with ejection fraction of 65% no evidence of left ventricular regional wall motion abnormalities. EEG was negative for seizure. Patient is currently maintained on aspirin 325 mg and Plavix for CVA prophylaxis for 3 months and then either aspirin  or Plavix alone. Tolerating a regular diet. Therapy evaluations completed with recommendations of physical medicine rehabilitation consult. Patient was admitted for a comprehensive rehabilitation program.  Review of Systems  Constitutional: Negative for chills and fever.  HENT: Positive for hearing loss.   Eyes: Positive for blurred vision.  Respiratory: Positive for shortness of breath.   Cardiovascular:  Positive for palpitations and leg swelling.  Gastrointestinal: Positive for constipation. Negative for nausea and vomiting.  Genitourinary: Positive for urgency. Negative for dysuria and hematuria.  Musculoskeletal: Positive for myalgias.  Skin: Negative for rash.  Neurological: Positive for dizziness.       Vertigo  All other systems reviewed and are negative.  Past Medical History:  Diagnosis Date  . BENIGN PROSTATIC HYPERTROPHY 04/05/2007  . 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 CHEST DISCOMFORT 04/25/2009  . Chronic diastolic CHF (congestive heart failure) (HCC)    a. Echo 12/16: mild LVH, EF 55-60%, no RWMA, Gr 1 DD, mild AI, MAC, mild LAE, normal RVF, PASP 37 mmHg  . Chronic kidney disease    chronic   . DJD (degenerative joint disease)   . Dysrhythmia    pvc's   . GERD 12/03/2007  . Hiatal hernia   . History of nuclear stress test    Myoview 1/17: EF 53%, normal perfusion; Low Risk  . HYPERLIPIDEMIA 04/05/2007  . Hypothyroidism   . Osteoarth NOS-Unspec 04/05/2007  . Palpitations 02/12/2010  . PEDAL EDEMA 05/01/2008  . Rheumatoid arthritis(714.0) 04/05/2007  . Shortness of breath dyspnea   . TRANSIENT ISCHEMIC ATTACK, HX OF 04/05/2007   ?    Past Surgical History:  Procedure Laterality Date  . BALLOON DILATION N/A 03/12/2013   Procedure: BALLOON DILATION;  Surgeon: Robert D Kaplan, MD;  Location: [facility];  Service: Endoscopy;  Laterality: N/A;  . CARPAL TUNNEL RELEASE  11/28/2018   Left hand  . CATARACT EXTRACTION  2001   bilateral  . CYSTOSCOPY WITH URETHRAL DILATATION N/A 09/24/2015   Procedure: CYSTOSCOPY WITH COOK BALLOON DILATATION OF URETHRAL STRICTURE ;  Surgeon: Sigmund Tannenbaum, MD;  Location:   [facility];  Service: Urology;  Laterality: N/A;  . CYSTOSCOPY WITH URETHRAL DILATATION N/A 07/20/2016   Procedure: CYSTOSCOPY WITH URETHRAL DILATATION;  Surgeon: Theodore Manny, MD;  Location: [facility];  Service: Urology;  Laterality: N/A;  1 HOUR  .  ESOPHAGOGASTRODUODENOSCOPY N/A 03/12/2013   Procedure: ESOPHAGOGASTRODUODENOSCOPY (EGD);  Surgeon: Robert D Kaplan, MD;  Location: [facility];  Service: Endoscopy;  Laterality: N/A;  . HEMORRHOID SURGERY    . HERNIA REPAIR     inguinal x5  . KNEE SURGERY  arthroscopic   left  . PROSTATE CRYOABLATION     2000  . SHOULDER SURGERY  few years ago   left  . TRANSURETHRAL RESECTION OF PROSTATE N/A 03/19/2015   Procedure: TRANSURETHRAL RESECTION OF THE PROSTATE (TURP);  Surgeon: Sigmund Tannenbaum, MD;  Location: [facility];  Service: Urology;  Laterality: N/A;   Family History  Problem Relation Age of Onset  . Breast cancer Mother   . Heart disease Father   . Heart attack Father   . Lymphoma Brother   . Stroke Neg Hx   . Hypertension Neg Hx    Social History:  reports that he quit smoking about 65 years ago. His smoking use included cigarettes. He has never used smokeless tobacco. He reports current alcohol use. He reports that he does not use drugs. Allergies:  Allergies  Allergen Reactions  . Codeine Other (See Comments)    Patient was "all over the place"- undesired side effect  . Sulfasalazine Other (See Comments)    Reaction not recalled  . Sulfonamide Derivatives Other (See Comments)    Reaction not recalled   Medications Prior to Admission  Medication Sig Dispense Refill  . acetaminophen (TYLENOL) 500 MG tablet Take 1,000 mg by mouth daily after breakfast.    . clopidogrel (PLAVIX) 75 MG tablet TAKE 1 TABLET BY MOUTH  DAILY (Patient taking differently: Take 75 mg by mouth daily. ) 90 tablet 1  . furosemide (LASIX) 20 MG tablet TAKE 1 TABLET BY MOUTH  DAILY AS NEEDED FOR FLUID  OR EDEMA. (Patient taking differently: Take 20-40 mg by mouth daily as needed (for fluid retention in the legs). ) 90 tablet 3  . latanoprost (XALATAN) 0.005 % ophthalmic solution Place 1 drop into both eyes at bedtime.   99  . levothyroxine (SYNTHROID, LEVOTHROID) 50 MCG tablet TAKE 1 TABLET BY MOUTH   DAILY (Patient taking differently: Take 50 mcg by mouth daily before breakfast. ) 90 tablet 2  . metoprolol succinate (TOPROL-XL) 25 MG 24 hr tablet TAKE 1 TABLET BY MOUTH AT  BEDTIME (Patient taking differently: Take 25 mg by mouth at bedtime. ) 90 tablet 1  . Multiple Vitamin (MULTIVITAMIN WITH MINERALS) TABS tablet Take 1 tablet by mouth 2 (two) times a week.     . nitroGLYCERIN (NITROSTAT) 0.4 MG SL tablet Place 0.4 mg under the tongue every 5 (five) minutes as needed (for Esophageal spasms).     . pantoprazole (PROTONIX) 40 MG tablet TAKE 1 TABLET BY MOUTH  DAILY (Patient taking differently: Take 40 mg by mouth daily before breakfast. ) 90 tablet 1  . albuterol (PROVENTIL HFA;VENTOLIN HFA) 108 (90 Base) MCG/ACT inhaler Inhale 2 puffs into the lungs every 4 (four) hours as needed for wheezing or shortness of breath. (Patient not taking: Reported on 12/31/2018) 1 Inhaler 0    Drug Regimen Review  Drug regimen was reviewed and remains appropriate with no significant issues identified  Home: Home Living Family/patient expects to be discharged to::   Private residence Living Arrangements: Spouse/significant other Available Help at Discharge: Family, Available 24 hours/day Type of Home: House Home Access: Stairs to enter Entrance Stairs-Number of Steps: 4 Entrance Stairs-Rails: Right Home Layout: Two level(chair lift) Alternate Level Stairs-Number of Steps: flight Bathroom Shower/Tub: Tub/shower unit Bathroom Toilet: Standard Home Equipment: Walker - 2 wheels  Lives With: Spouse   Functional History: Prior Function Level of Independence: Independent Comments: ADLs and simple IADLs. used no AD in house, used RW occasionally outside of home  Functional Status:  Mobility: Bed Mobility General bed mobility comments: pt sitting EOB upon arrival Transfers Overall transfer level: Needs assistance Equipment used: Rolling walker (2 wheeled) Transfers: Sit to/from Stand Sit to Stand: Min  guard Stand pivot transfers: Mod assist General transfer comment: min guard for safety; pt pulling up on RW with bilateral UEs despite tactile and verbal cueing for safe hand placement Ambulation/Gait Ambulation/Gait assistance: Min assist, Min guard Gait Distance (Feet): 15 Feet Assistive device: Rolling walker (2 wheeled) Gait Pattern/deviations: Step-to pattern, Step-through pattern, Decreased step length - right, Decreased step length - left, Decreased stride length, Trunk flexed General Gait Details: pt with mild instability with ambulation, limited secondary to fatigue and weakness. Pt ambulated within room with min guard to min A level for safety and stability Gait velocity: decreased    ADL: ADL Overall ADL's : Needs assistance/impaired Eating/Feeding: Supervision/ safety, Set up, Sitting Grooming: Wash/dry face, Min guard, Standing, Minimal assistance Upper Body Bathing: Min guard, Sitting Lower Body Bathing: Minimal assistance, +2 for safety/equipment, Sit to/from stand Lower Body Bathing Details (indicate cue type and reason): Pt performing peri care while at sink and required Min A for balance and gown management. Upper Body Dressing : Min guard, Sitting Upper Body Dressing Details (indicate cue type and reason): Donning second gown like a jacket Lower Body Dressing: Minimal assistance, Sit to/from stand Lower Body Dressing Details (indicate cue type and reason): Pt able to bend forward to adjust socks but became dizzy Toilet Transfer: Minimal assistance, Ambulation, RW(simulated to recliner) Toileting - Clothing Manipulation Details (indicate cue type and reason): Pt performing peri care while at sink and required Min A for balance and gown management. Functional mobility during ADLs: Minimal assistance, Rolling walker General ADL Comments: Pt presenting with dizziness, decreased strength, poor balance, and decreased ST memory.  Cognition: Cognition Overall Cognitive  Status: Impaired/Different from baseline Arousal/Alertness: Awake/alert Orientation Level: Oriented X4 Attention: Focused Focused Attention: Appears intact Memory: Impaired Memory Impairment: Retrieval deficit Awareness: Appears intact Problem Solving: Appears intact Executive Function: Reasoning Reasoning: Appears intact Safety/Judgment: Appears intact Cognition Arousal/Alertness: Awake/alert Behavior During Therapy: WFL for tasks assessed/performed Overall Cognitive Status: Impaired/Different from baseline Area of Impairment: Memory Memory: Decreased short-term memory, Decreased recall of precautions General Comments: pt repeatedly stating that he can't remember anything (which is not his baseline cognition)  Physical Exam: Blood pressure 131/84, pulse 88, temperature (!) 97.4 F (36.3 C), temperature source Oral, resp. rate 17, height 5' 2" (1.575 m), weight 65.8 kg, SpO2 100 %. Physical Exam  HENT:  Head: Normocephalic.  Eyes: EOM are normal.  Neck: Normal range of motion. Neck supple. No thyromegaly present.  Cardiovascular: Normal rate and regular rhythm.  Respiratory: Effort normal and breath sounds normal. No respiratory distress.  GI: Soft. Bowel sounds are normal. He exhibits no distension.  Neurological:  Alert male sitting at edge of bed. He appears appropriate. Provides his name, age and date of birth. Follows full commands  Strength is 5/5 bilateral deltoid bicep tricep   grip hip flexor knee extensor ankle dorsiflexor Sensation intact light touch bilateral upper and lower limb No evidence of dysmetria finger-nose-finger testing Visual fields are intact confrontation testing Cranial nerves II through XII are intact No pain with upper extremity or lower extremity range of motion He has decreased range of motion at the knees has bilateral knee contractures. Patient complains of dizziness when turning his head side to side Results for orders placed or performed  during the hospital encounter of 12/31/18 (from the past 48 hour(s))  Basic metabolic panel     Status: Abnormal   Collection Time: 01/02/19  5:40 AM  Result Value Ref Range   Sodium 142 135 - 145 mmol/L   Potassium 4.1 3.5 - 5.1 mmol/L   Chloride 108 98 - 111 mmol/L   CO2 27 22 - 32 mmol/L   Glucose, Bld 123 (H) 70 - 99 mg/dL   BUN 17 8 - 23 mg/dL   Creatinine, Ser 1.53 (H) 0.61 - 1.24 mg/dL   Calcium 8.9 8.9 - 10.3 mg/dL   GFR calc non Af Amer 37 (L) >60 mL/min   GFR calc Af Amer 43 (L) >60 mL/min   Anion gap 7 5 - 15    Comment: Performed at [facility], [address] [place], [place] [address]   No results found.     Medical Problem List and Plan: 1.  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  DVT Prophylaxis/Anticoagulation: SCDs. Monitor for any signs of DVT 3. Pain Management:  Tylenol as needed 4. Mood:  Provide emotional support 5. Neuropsych: This patient is capable of making decisions on his own behalf. 6. Skin/Wound Care: routine skin checks 7. Fluids/Electrolytes/Nutrition:  Routine in and out's with follow-up chemistries, monitor meal intake 8.  Chronic diastolic congestive heart failure. Monitor for any signs of fluid overload. Patient on Lasix 20-40 mg as needed prior to admission. Resume as needed 9. CKD. Creatinine baseline 1.56-1.70 10. Hypertension. Toprol-XL 25 mg daily, monitor vitals q shift 11. Hypothyroidism.  TSH 2.124. Continue Synthroid 12. Hyperlipidemia. Lipitor Post Admission Physician Evaluation: 1. Functional deficits secondary  to RIght occipital infarct. 2. Patient admitted to receive collaborative, interdisciplinary care between the physiatrist, rehab nursing staff, and therapy team. 3. Patient's level of medical complexity and substantial therapy needs in context of that medical necessity cannot be provided at a lesser intensity of  care. 4. Patient has experienced substantial functional loss from his/her baseline. Judging by the patient's diagnosis, physical exam, and functional history, the patient has potential for functional progress which will result in measurable gains while on inpatient rehab.  These gains will be of substantial and practical use upon discharge in facilitating mobility and self-care at the household level. 5. Physiatrist will provide 24 hour management of medical needs as well as oversight of the therapy plan/treatment and provide guidance as appropriate regarding the interaction of the two. 6. 24 hour rehab nursing will assist in the management of  bladder management, bowel management, safety, skin/wound care, disease management, medication administration, pain management and patient education  and help integrate therapy concepts, techniques,education, etc. 7. PT will assess and treat for: DME instruction, Gait training, Stair training, Functional mobility training, Therapeutic activities, Therapeutic exercise, Balance training, Neuromuscular re-education, Cognitive remediation, Patient/family education.  Goals are: independent with assistive device. 8. OT will assess and treat for Self-care/ADL training, Therapeutic exercise, Energy conservation, DME and/or AE instruction, Therapeutic activities, Patient/family education.    Goals are: independent with increased time.  9. SLP will assess and treat for  .  Goals are: N/A. 10. Case Management and Social Worker will assess and treat for psychological issues and discharge planning. 11. Team conference will be held weekly to assess progress toward goals and to determine barriers to discharge. 12.  Patient will receive at least 3 hours of therapy per day at least 5 days per week. 13. ELOS and Prognosis: 7-10d excellent    "I have personally performed a face to face diagnostic evaluation of this patient.  Additionally, I have reviewed and concur with the physician  assistant's documentation above." Melenda Bielak E. Lennyn Gange M.D. [facility] FAAPM&R (Sports Med, Neuromuscular Med) Diplomate Am Board of Electrodiagnostic Med  Daniel J Angiulli, PA-C 01/03/2019 

## 2019-01-02 NOTE — Progress Notes (Signed)
PROGRESS NOTE    Robert Rowe  QIH:[id number] DOB: [birth year] DOA: 12/31/2018 PCP: [name], MD    Brief Narrative:  85 year old male who presented with vertigo.  He does have significant past medical history for hypothyroidism, GERD, chronic diastolic heart failure, hypertension and chronic kidney disease.  Reported vertigo for the last 2 or 3 days, associated with decreased hearing on the right ear, along with disbalance and a mechanical fall while in the bathroom.  No syncope or loss of consciousness.  On his initial physical examination blood pressure 139/96, heart rate 86, temperature 98 F, respiratory rate 18, oxygen saturation 98%, he was alert and awake, dry mucous membranes, lungs clear to auscultation bilaterally, heart S1-S2 present and rhythmic, abdomen soft, 1+ edema, strength preserved, cranial nerves were intact.  Sodium 141, potassium 4.9, chloride 105, bicarb 27, glucose 156, BUN 16, creatinine 1.5, white count 5.9, hemoglobin 15.8, hematocrit 48.3, platelets 269, urinalysis negative for infection. 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  Severe near occlusive stenosis involving the proximal mid left M1 segment, with additional downstream proximal left M2 stenosis.  Moderate diffuse atheromatous narrowing involving the supraclinoid ICA bilaterally, neck MRI with 50 to 60% of athematous stenosis right carotid bifurcation.  EKG with normal sinus rhythm, normal axis normal intervals.  MRI 6 mm acute ischemic nonhemorrhagic infarct involving the left occipital lobe.   Patient was admitted to the hospital with working diagnosis of acute CVA.   Assessment & Plan:   Active Problems:   Dyslipidemia   GERD   Hypothyroidism   Essential hypertension   CKD (chronic kidney disease) stage 3, GFR 30-59 ml/min (HCC)   CVA (cerebral vascular  accident) ([place])   Acute CVA (cerebrovascular accident) ([place])   1. Acute CVA in the left occipital lobe. Will continue supportive medical therapy, for blood pressure control, statin and antiplatelet therapy. Patient waiting for inpatient rehab. Continue fall precautions. Will need 3 months of dual antiplatelet therapy with aspirin and clopidogrel then continue monotherapy.   2. Benign positional vertigo. Patient was seen by neurology Windy Kalata maneuver has performed, patient will need to continue physical therapy and fall precautions.     3. Dyslipidemia. Continue statin therapy.   4. Hypothyroid. Continue levothyroxine per home regimen.   5. HTN. Continue blood pressure control metoprolol 25 mg daily.   6. CKD stage 3. Renal function stable.   7. Diastolic heart failure. Chronic and stable with no signs of exacerbation.    DVT prophylaxis: enoxaparin   Code Status: full Family Communication: no family at the bedside  Disposition Plan/ discharge barriers: pending inpatient rehab.   Body mass index is 26.52 kg/m. Malnutrition Type:      Malnutrition Characteristics:      Nutrition Interventions:     RN Pressure Injury Documentation:     Consultants:   Neurology   Procedures:     Antimicrobials:       Subjective: Patient continue to feel dizzy, and dis-balance, no nausea or vomiting, no chest pain. No nausea or vomiting.   Objective: Vitals:   01/01/19 2144 01/02/19 0020 01/02/19 0415 01/02/19 0708  BP: 126/70 (!) 143/79 132/74   Pulse: 87 98 82 87  Resp: 19 18 18    Temp: 98.4 F (36.9 C) 98.4 F (36.9 C) 98.4 F (36.9 C) 97.7 F (36.5 C)  TempSrc: Oral Oral Oral Oral  SpO2: 95%  96% 97% 93%  Weight:      Height:        Intake/Output Summary (Last 24 hours) at 01/02/2019 0815 Last data filed at 01/02/2019 0029 Gross per 24 hour  Intake -  Output 300 ml  Net -300 ml   Filed Weights   12/31/18 1223  Weight: 65.8 kg    Examination:    General: deconditioned  Neurology: Awake and alert, non focal  E ENT: mild pallor, no icterus, oral mucosa moist Cardiovascular: No JVD. S1-S2 present, rhythmic, no gallops, rubs, or murmurs. No lower extremity edema. Pulmonary: positive breath sounds bilaterally, adequate air movement, no wheezing, rhonchi or rales. Gastrointestinal. Abdomen with no organomegaly, non tender, no rebound or guarding Skin. No rashes Musculoskeletal: no joint deformities     Data Reviewed: I have personally reviewed following labs and imaging studies  CBC: Recent Labs  Lab 12/31/18 1227 01/01/19 0421  WBC 5.9 6.1  HGB 15.8 13.3  HCT 48.3 40.3  MCV 98.4 97.1  PLT 269 127   Basic Metabolic Panel: Recent Labs  Lab 12/31/18 1227 01/01/19 0421 01/02/19 0540  NA 141 143 142  K 4.9 4.0 4.1  CL 105 109 108  CO2 27 25 27   GLUCOSE 156* 122* 123*  BUN 16 16 17   CREATININE 1.56* 1.58* 1.53*  CALCIUM 9.7 9.3 8.9  MG  --  2.0  --   PHOS  --  3.9  --    GFR: Estimated Creatinine Clearance: 22.5 mL/min (A) (by C-G formula based on SCr of 1.53 mg/dL (H)). Liver Function Tests: Recent Labs  Lab 01/01/19 0421  AST 16  ALT 13  ALKPHOS 45  BILITOT 0.8  PROT 5.5*  ALBUMIN 3.0*   No results for input(s): LIPASE, AMYLASE in the last 168 hours. No results for input(s): AMMONIA in the last 168 hours. Coagulation Profile: No results for input(s): INR, PROTIME in the last 168 hours. Cardiac Enzymes: No results for input(s): CKTOTAL, CKMB, CKMBINDEX, TROPONINI in the last 168 hours. BNP (last 3 results) No results for input(s): PROBNP in the last 8760 hours. HbA1C: Recent Labs    01/01/19 0421  HGBA1C 6.2*   CBG: Recent Labs  Lab 12/31/18 1320  GLUCAP 154*   Lipid Profile: Recent Labs    01/01/19 0421  CHOL 252*  HDL 34*  LDLCALC 177*  TRIG 204*  CHOLHDL 7.4   Thyroid Function Tests: Recent Labs    12/31/18 1428  TSH 2.124   Anemia Panel: No results for input(s):  VITAMINB12, FOLATE, FERRITIN, TIBC, IRON, RETICCTPCT in the last 72 hours.    Radiology Studies: I have reviewed all of the imaging during this hospital visit personally     Scheduled Meds: . aspirin  300 mg Rectal Daily   Or  . aspirin  325 mg Oral Daily  . atorvastatin  80 mg Oral q1800  . clopidogrel  75 mg Oral Daily  . latanoprost  1 drop Both Eyes QHS  . levothyroxine  50 mcg Oral QAC breakfast  . pantoprazole  40 mg Oral QAC breakfast   Continuous Infusions:   LOS: 1 day        Clemons Salvucci Gerome Apley, MD

## 2019-01-02 NOTE — Progress Notes (Signed)
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    PT Comments    Pt seen for mobility progression. He is making steady progress towards achieving his currently mobility goals and tolerated gait training within his room with RW. Feel pt is an excellent candidate for further intensive therapies at CIR to maximize his independence with functional mobility prior to returning home with his wife. PT will continue to follow acutely to progress mobility as tolerated.     Follow Up Recommendations  CIR;Supervision/Assistance - 24 hour     Equipment Recommendations  None recommended by PT    Recommendations for Other Services       Precautions / Restrictions Precautions Precautions: Fall Restrictions Weight Bearing Restrictions: No    Mobility  Bed Mobility               General bed mobility comments: pt sitting EOB upon arrival  Transfers Overall transfer level: Needs assistance Equipment used: Rolling walker (2 wheeled) Transfers: Sit to/from Stand Sit to Stand: Min guard         General transfer comment: min guard for safety; pt pulling up on RW with bilateral UEs despite tactile and verbal cueing for safe hand placement  Ambulation/Gait Ambulation/Gait assistance: Min assist;Min guard Gait Distance (Feet): 15 Feet Assistive device: Rolling walker (2 wheeled) Gait Pattern/deviations: Step-to pattern;Step-through pattern;Decreased step length - right;Decreased step length - left;Decreased stride length;Trunk flexed Gait velocity: decreased   General Gait Details: pt with mild instability with ambulation, limited secondary to fatigue and weakness. Pt ambulated  within room with min guard to min A level for safety and stability   Stairs             Wheelchair Mobility    Modified Rankin (Stroke Patients Only) Modified Rankin (Stroke Patients Only) Pre-Morbid Rankin Score: No symptoms Modified Rankin: Moderately severe disability     Balance Overall balance assessment: Needs assistance Sitting-balance support: Feet supported Sitting balance-Leahy Scale: Fair Sitting balance - Comments: pt able to sit EOB with min guard and adjust socks without difficulties   Standing balance support: Bilateral upper extremity supported;Single extremity supported Standing balance-Leahy Scale: Poor Standing balance comment: pt able to stand and sink and perform ADLs tasks with close min guard and occasional min A with 1-2 UE supports                            Cognition Arousal/Alertness: Awake/alert Behavior During Therapy: WFL for tasks assessed/performed Overall Cognitive Status: Impaired/Different from baseline Area of Impairment: Memory                     Memory: Decreased short-term memory;Decreased recall of precautions         General Comments: pt repeatedly stating that he can't remember anything (which is not his baseline cognition)      Exercises      General Comments        Pertinent Vitals/Pain Pain Assessment: No/denies pain    Home Living                      Prior Function  PT Goals (current goals can now be found in the care plan section) Acute Rehab PT Goals PT Goal Formulation: With patient Time For Goal Achievement: 01/15/19 Potential to Achieve Goals: Fair Progress towards PT goals: Progressing toward goals    Frequency    Min 4X/week      PT Plan Current plan remains appropriate    Co-evaluation PT/OT/SLP Co-Evaluation/Treatment: Yes Reason for Co-Treatment: To address functional/ADL transfers;For patient/therapist safety PT goals addressed during  session: Balance;Mobility/safety with mobility;Proper use of DME;Strengthening/ROM        AM-PAC PT "6 Clicks" Mobility   Outcome Measure  Help needed turning from your back to your side while in a flat bed without using bedrails?: A Lot Help needed moving from lying on your back to sitting on the side of a flat bed without using bedrails?: A Lot Help needed moving to and from a bed to a chair (including a wheelchair)?: A Little Help needed standing up from a chair using your arms (e.g., wheelchair or bedside chair)?: A Little Help needed to walk in hospital room?: A Little Help needed climbing 3-5 steps with a railing? : A Lot 6 Click Score: 15    End of Session Equipment Utilized During Treatment: Gait belt Activity Tolerance: Patient tolerated treatment well Patient left: in chair;with call bell/phone within reach;with chair alarm set Nurse Communication: Mobility status PT Visit Diagnosis: Unsteadiness on feet (R26.81);Muscle weakness (generalized) (M62.81)     Time: 3143-8887 PT Time Calculation (min) (ACUTE ONLY): 31 min  Charges:  $Therapeutic Activity: 8-22 mins                     Sherie Don, [place], DPT  Acute Rehabilitation Services Pager [phone] Office [place] 01/02/2019, 10:30 AM

## 2019-01-03 ENCOUNTER — Encounter (HOSPITAL_COMMUNITY): Payer: Self-pay

## 2019-01-03 ENCOUNTER — Encounter: Payer: Self-pay | Admitting: *Deleted

## 2019-01-03 ENCOUNTER — Inpatient Hospital Stay (HOSPITAL_COMMUNITY)
Admission: RE | Admit: 2019-01-03 | Discharge: 2019-01-10 | DRG: 057 | Disposition: A | Discharge status: Home Health | Payer: Medicare Other | Source: Intra-hospital | Attending: Physical Medicine & Rehabilitation | Admitting: Physical Medicine & Rehabilitation

## 2019-01-03 ENCOUNTER — Other Ambulatory Visit: Payer: Self-pay

## 2019-01-03 DIAGNOSIS — Z7982 Long term (current) use of aspirin: Secondary | ICD-10-CM

## 2019-01-03 DIAGNOSIS — I69398 Other sequelae of cerebral infarction: Secondary | ICD-10-CM

## 2019-01-03 DIAGNOSIS — E039 Hypothyroidism, unspecified: Secondary | ICD-10-CM | POA: Diagnosis present

## 2019-01-03 DIAGNOSIS — Z79899 Other long term (current) drug therapy: Secondary | ICD-10-CM | POA: Diagnosis not present

## 2019-01-03 DIAGNOSIS — I6300 Cerebral infarction due to thrombosis of unspecified precerebral artery: Secondary | ICD-10-CM

## 2019-01-03 DIAGNOSIS — I130 Hypertensive heart and chronic kidney disease with heart failure and stage 1 through stage 4 chronic kidney disease, or unspecified chronic kidney disease: Secondary | ICD-10-CM | POA: Diagnosis not present

## 2019-01-03 DIAGNOSIS — Z87891 Personal history of nicotine dependence: Secondary | ICD-10-CM | POA: Diagnosis not present

## 2019-01-03 DIAGNOSIS — R269 Unspecified abnormalities of gait and mobility: Secondary | ICD-10-CM

## 2019-01-03 DIAGNOSIS — G450 Vertebro-basilar artery syndrome: Secondary | ICD-10-CM | POA: Diagnosis present

## 2019-01-03 DIAGNOSIS — N400 Enlarged prostate without lower urinary tract symptoms: Secondary | ICD-10-CM | POA: Diagnosis present

## 2019-01-03 DIAGNOSIS — N189 Chronic kidney disease, unspecified: Secondary | ICD-10-CM | POA: Diagnosis present

## 2019-01-03 DIAGNOSIS — E785 Hyperlipidemia, unspecified: Secondary | ICD-10-CM | POA: Diagnosis present

## 2019-01-03 DIAGNOSIS — I69328 Other speech and language deficits following cerebral infarction: Secondary | ICD-10-CM

## 2019-01-03 DIAGNOSIS — Z9079 Acquired absence of other genital organ(s): Secondary | ICD-10-CM

## 2019-01-03 DIAGNOSIS — I5032 Chronic diastolic (congestive) heart failure: Secondary | ICD-10-CM | POA: Diagnosis present

## 2019-01-03 DIAGNOSIS — Z7902 Long term (current) use of antithrombotics/antiplatelets: Secondary | ICD-10-CM

## 2019-01-03 DIAGNOSIS — M069 Rheumatoid arthritis, unspecified: Secondary | ICD-10-CM | POA: Diagnosis not present

## 2019-01-03 DIAGNOSIS — Z79890 Hormone replacement therapy (postmenopausal): Secondary | ICD-10-CM

## 2019-01-03 DIAGNOSIS — I10 Essential (primary) hypertension: Secondary | ICD-10-CM | POA: Diagnosis not present

## 2019-01-03 DIAGNOSIS — I639 Cerebral infarction, unspecified: Secondary | ICD-10-CM | POA: Diagnosis not present

## 2019-01-03 DIAGNOSIS — H5462 Unqualified visual loss, left eye, normal vision right eye: Secondary | ICD-10-CM | POA: Diagnosis present

## 2019-01-03 LAB — BASIC METABOLIC PANEL
Anion gap: 11 (ref 5–15)
BUN: 16 mg/dL (ref 8–23)
CO2: 25 mmol/L (ref 22–32)
Calcium: 8.8 mg/dL — ABNORMAL LOW (ref 8.9–10.3)
Chloride: 106 mmol/L (ref 98–111)
Creatinine, Ser: 1.61 mg/dL — ABNORMAL HIGH (ref 0.61–1.24)
GFR calc Af Amer: 41 mL/min — ABNORMAL LOW (ref >60)
GFR calc non Af Amer: 35 mL/min — ABNORMAL LOW (ref >60)
Glucose, Bld: 117 mg/dL — ABNORMAL HIGH (ref 70–99)
Potassium: 4 mmol/L (ref 3.5–5.1)
Sodium: 142 mmol/L (ref 135–145)

## 2019-01-03 MED ORDER — ASPIRIN EC 81 MG PO TBEC
81.0000 mg | DELAYED_RELEASE_TABLET | Freq: Every day | ORAL | Status: DC
  Administered 2019-01-03: 81 mg via ORAL
  Filled 2019-01-03: qty 1

## 2019-01-03 MED ORDER — PANTOPRAZOLE SODIUM 40 MG PO TBEC
40.0000 mg | DELAYED_RELEASE_TABLET | Freq: Every day | ORAL | Status: DC
  Administered 2019-01-04 – 2019-01-10 (×7): 40 mg via ORAL
  Filled 2019-01-03 (×7): qty 1

## 2019-01-03 MED ORDER — ATORVASTATIN CALCIUM 80 MG PO TABS
80.0000 mg | ORAL_TABLET | Freq: Every day | ORAL | Status: DC
  Administered 2019-01-03 – 2019-01-09 (×7): 80 mg via ORAL
  Filled 2019-01-03 (×7): qty 1

## 2019-01-03 MED ORDER — CLOPIDOGREL BISULFATE 75 MG PO TABS
75.0000 mg | ORAL_TABLET | Freq: Every day | ORAL | Status: DC
  Administered 2019-01-04 – 2019-01-10 (×7): 75 mg via ORAL
  Filled 2019-01-03 (×7): qty 1

## 2019-01-03 MED ORDER — ONDANSETRON HCL 4 MG PO TABS: 4.0000 mg | ORAL_TABLET | Freq: Four times a day (QID) | ORAL | Status: DC | PRN

## 2019-01-03 MED ORDER — METOPROLOL SUCCINATE ER 25 MG PO TB24
25.0000 mg | ORAL_TABLET | Freq: Every day | ORAL | Status: DC
  Administered 2019-01-04 – 2019-01-10 (×7): 25 mg via ORAL
  Filled 2019-01-03 (×7): qty 1

## 2019-01-03 MED ORDER — LATANOPROST 0.005 % OP SOLN
1.0000 [drp] | Freq: Every day | OPHTHALMIC | Status: DC
  Administered 2019-01-03 – 2019-01-09 (×7): 1 [drp] via OPHTHALMIC
  Filled 2019-01-03: qty 2.5

## 2019-01-03 MED ORDER — ONDANSETRON HCL 4 MG/2ML IJ SOLN: 4.0000 mg | Freq: Four times a day (QID) | INTRAMUSCULAR | Status: DC | PRN

## 2019-01-03 MED ORDER — ACETAMINOPHEN 325 MG PO TABS
650.0000 mg | ORAL_TABLET | Freq: Four times a day (QID) | ORAL | Status: DC | PRN
  Filled 2019-01-03: qty 2

## 2019-01-03 MED ORDER — ACETAMINOPHEN 650 MG RE SUPP: 650.0000 mg | Freq: Four times a day (QID) | RECTAL | Status: DC | PRN

## 2019-01-03 MED ORDER — ALBUTEROL SULFATE (2.5 MG/3ML) 0.083% IN NEBU: 2.5000 mg | INHALATION_SOLUTION | RESPIRATORY_TRACT | Status: DC | PRN

## 2019-01-03 MED ORDER — ATORVASTATIN CALCIUM 80 MG PO TABS: 80.0000 mg | ORAL_TABLET | Freq: Every day | ORAL | 0 refills | Status: DC

## 2019-01-03 MED ORDER — ASPIRIN 81 MG PO TBEC: 81.0000 mg | DELAYED_RELEASE_TABLET | Freq: Every day | ORAL | 0 refills | Status: DC

## 2019-01-03 MED ORDER — SORBITOL 70 % SOLN
30.0000 mL | Freq: Every day | Status: DC | PRN
  Filled 2019-01-03 (×2): qty 30

## 2019-01-03 MED ORDER — LEVOTHYROXINE SODIUM 50 MCG PO TABS
50.0000 ug | ORAL_TABLET | Freq: Every day | ORAL | Status: DC
  Administered 2019-01-04 – 2019-01-10 (×7): 50 ug via ORAL
  Filled 2019-01-03 (×7): qty 1

## 2019-01-03 NOTE — Progress Notes (Signed)
Pt arrived to unit via wc. Transferred to recliner. Belongings at bedside. Oriented to room and unit. Call bell within reach. Will cont to monitor.   Erie Noe, LPN

## 2019-01-03 NOTE — Progress Notes (Signed)
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    PT Comments    Pt making steady progress with functional mobility. Plan is for pt to d/c to CIR later today. Expect pt to make really good progress with his functional mobility and balance prior to returning home with family support.  Follow Up Recommendations  CIR;Supervision/Assistance - 24 hour     Equipment Recommendations  None recommended by PT    Recommendations for Other Services       Precautions / Restrictions Precautions Precautions: Fall Restrictions Weight Bearing Restrictions: No    Mobility  Bed Mobility               General bed mobility comments: pt OOB in recliner chair upon arrival  Transfers Overall transfer level: Needs assistance Equipment used: None Transfers: Sit to/from Stand Sit to Stand: Min guard         General transfer comment: min guard, cueing for safe hand placement, progressing from needing bilateral UE supports on arm rests to hand in lap  Ambulation/Gait             General Gait Details: focus of session was on transfers   Stairs             Wheelchair Mobility    Modified Rankin (Stroke Patients Only) Modified Rankin (Stroke Patients Only) Pre-Morbid Rankin Score: No symptoms Modified Rankin: Moderately severe disability     Balance Overall balance assessment: Needs assistance Sitting-balance support: Feet supported Sitting balance-Leahy Scale: Fair     Standing balance support: Bilateral upper extremity supported;Single extremity supported Standing balance-Leahy Scale: Poor                              Cognition  Arousal/Alertness: Awake/alert Behavior During Therapy: WFL for tasks assessed/performed Overall Cognitive Status: Impaired/Different from baseline Area of Impairment: Memory                     Memory: Decreased short-term memory;Decreased recall of precautions                Exercises General Exercises - Lower Extremity Ankle Circles/Pumps: AROM;Both;10 reps;Seated Long Arc Quad: AROM;Strengthening;Both;10 reps;Seated Hip ABduction/ADduction: AROM;Strengthening;Both;10 reps;Seated Hip Flexion/Marching: AROM;Strengthening;Both;20 reps;Seated Other Exercises Other Exercises: pt performed 5x sit<>stand with min guard and cueing for hand placement; progressing from 2 UE supports on arm rests of chair to hands in lap    General Comments        Pertinent Vitals/Pain Pain Assessment: No/denies pain    Home Living                      Prior Function            PT Goals (current goals can now be found in the care plan section) Acute Rehab PT Goals PT Goal Formulation: With patient Time For Goal Achievement: 01/15/19 Potential to Achieve Goals: Good Progress towards PT goals: Progressing toward goals    Frequency    Min 4X/week      PT Plan Current plan remains appropriate    Co-evaluation  AM-PAC PT "6 Clicks" Mobility   Outcome Measure  Help needed turning from your back to your side while in a flat bed without using bedrails?: A Little Help needed moving from lying on your back to sitting on the side of a flat bed without using bedrails?: A Little Help needed moving to and from a bed to a chair (including a wheelchair)?: A Little Help needed standing up from a chair using your arms (e.g., wheelchair or bedside chair)?: A Little Help needed to walk in hospital room?: A Little Help needed climbing 3-5 steps with a railing? : A Lot 6 Click Score: 17    End of Session Equipment Utilized During Treatment: Gait belt Activity  Tolerance: Patient tolerated treatment well Patient left: in chair;with call bell/phone within reach;with chair alarm set Nurse Communication: Mobility status PT Visit Diagnosis: Unsteadiness on feet (R26.81);Muscle weakness (generalized) (M62.81)     Time: 8115-7262 PT Time Calculation (min) (ACUTE ONLY): 21 min  Charges:  $Therapeutic Activity: 8-22 mins                     Sherie Don, [place], DPT  Acute Rehabilitation Services Pager [phone] Office [place] 01/03/2019, 3:10 PM

## 2019-01-03 NOTE — Discharge Summary (Signed)
Physician Discharge Summary  Robert Rowe IFO:[id number] DOB: May 05, 1918 DOA: 12/31/2018  PCP: [name], MD  Admit date: 12/31/2018 Discharge date: 01/03/2019  Admitted From: Home  Disposition:  Inpatient rehab  Recommendations for Outpatient Follow-up and new medication changes:  1. Follow up with Dr. Elease Hashimoto in 7 days.  2. Patient was placed on aspirin 81 mg, to continue dual antiplatelet therapy along with clopidogrel for 3 months, then continue clopidogrel alone.   Home Health: Na   Equipment/Devices: Na    Discharge Condition: stable  CODE STATUS: full  Diet recommendation: heart healthy   Brief/Interim Summary: 85 year old male who presented with vertigo. [name] does have a significant past medical history for hypothyroidism, GERD, chronic diastolic heart failure, hypertension and chronic kidney disease.  Reported vertigo for the last 2 or 3 days, associated with decreased hearing on the right ear, along with disbalance and a mechanical fall while in the bathroom.  No syncope or loss of consciousness.  On his initial physical examination blood pressure 139/96, heart rate 86, temperature 98 F, respiratory rate 18, oxygen saturation 98%, he was alert and awake, dry mucous membranes, lungs were clear to auscultation bilaterally, heart S1-S2 present and rhythmic, abdomen soft, 1+ edema, strength preserved, cranial nerves were intact.  Sodium 141, potassium 4.9, chloride 105, bicarb 27, glucose 156, BUN 16, creatinine 1.5, white count 5.9, hemoglobin 15.8, hematocrit 48.3, platelets 269, urinalysis negative for infection. 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  Severe near occlusive stenosis involving the proximal mid left M1 segment, with additional downstream proximal left M2 stenosis.  Moderate diffuse atheromatous narrowing involving the  supraclinoid ICA bilaterally, neck MRI with 50 to 60% of athematous stenosis right carotid bifurcation.  EKG with normal sinus rhythm, normal axis normal intervals.  MRI 6 mm acute ischemic nonhemorrhagic infarct involving the left occipital lobe.   Patient was admitted to the hospital with working diagnosis of acute CVA.   1.  Acute CVA in the left occipital lobe.  Patient was admitted to a telemetry unit, he had frequent neuro checks, evaluation by physical therapy and neurology.  His symptoms have improved but still has dis-balance.  He was placed on dual antiplatelet therapy with aspirin and clopidogrel, due to his severe intracranial disease, after 3 months he can continue clopidogrel alone.  It was recommended high-dose atorvastatin was added, 80 mg daily.  Patient will continue physical therapy at inpatient rehab.  Further work-up with electroencephalography was negative, echocardiography had ejection fraction of 65% left ventricle, his LDL was 177 and his A1c was 5.2.   2.  Benign positional vertigo.  Dix-Hallpike maneuver was positive bilaterally with vertigo recurrence and rotational nystagmus.  It was recommended to continue education for BPPV maneuver.  Continue outpatient follow-up with with Dr. Delice Lesch at [facility] or neurology January 11, 6766.   3.  Diastolic heart failure.  Chronic, no signs of acute exacerbation continue taking as needed furosemide.  4.  Chronic kidney disease stage III.  His renal function remained stable.  5.  Hypertension.  Continue blood pressure control with metoprolol.  6.  Dyslipidemia.  Continue therapy with atorvastatin.  7.  Hypothyroid.  Continue levothyroxine.  Discharge Diagnoses:  Active Problems:   Dyslipidemia   GERD   Hypothyroidism   Essential hypertension   CKD (chronic kidney disease) stage 3, GFR 30-59 ml/min (HCC)   CVA (cerebral vascular accident) (  [place])   Acute CVA (cerebrovascular accident) ([place])   BPV (benign positional  vertigo)    Discharge Instructions   Allergies as of 01/03/2019      Reactions   Codeine Other (See Comments)   Patient was "all over the place"- undesired side effect   Sulfasalazine Other (See Comments)   Reaction not recalled   Sulfonamide Derivatives Other (See Comments)   Reaction not recalled      Medication List    TAKE these medications   acetaminophen 500 MG tablet Commonly known as:  TYLENOL Take 1,000 mg by mouth daily after breakfast.   albuterol 108 (90 Base) MCG/ACT inhaler Commonly known as:  PROVENTIL HFA;VENTOLIN HFA Inhale 2 puffs into the lungs every 4 (four) hours as needed for wheezing or shortness of breath.   aspirin 81 MG EC tablet Take 1 tablet (81 mg total) by mouth daily for 30 days.   atorvastatin 80 MG tablet Commonly known as:  LIPITOR Take 1 tablet (80 mg total) by mouth daily at 6 PM for 30 days.   clopidogrel 75 MG tablet Commonly known as:  PLAVIX TAKE 1 TABLET BY MOUTH  DAILY   furosemide 20 MG tablet Commonly known as:  LASIX TAKE 1 TABLET BY MOUTH  DAILY AS NEEDED FOR FLUID  OR EDEMA. What changed:  See the new instructions.   latanoprost 0.005 % ophthalmic solution Commonly known as:  XALATAN Place 1 drop into both eyes at bedtime.   levothyroxine 50 MCG tablet Commonly known as:  SYNTHROID, LEVOTHROID TAKE 1 TABLET BY MOUTH  DAILY What changed:    how much to take  how to take this  when to take this  additional instructions   metoprolol succinate 25 MG 24 hr tablet Commonly known as:  TOPROL-XL TAKE 1 TABLET BY MOUTH AT  BEDTIME   multivitamin with minerals Tabs tablet Take 1 tablet by mouth 2 (two) times a week.   nitroGLYCERIN 0.4 MG SL tablet Commonly known as:  NITROSTAT Place 0.4 mg under the tongue every 5 (five) minutes as needed (for Esophageal spasms).   pantoprazole 40 MG tablet Commonly known as:  PROTONIX TAKE 1 TABLET BY MOUTH  DAILY What changed:  when to take this      Follow-up  Information    Cameron Sprang, MD. Go on 01/11/2019.   Specialty:  Neurology Contact information: [address] [place] [address] [phone]          Allergies  Allergen Reactions  . Codeine Other (See Comments)    Patient was "all over the place"- undesired side effect  . Sulfasalazine Other (See Comments)    Reaction not recalled  . Sulfonamide Derivatives Other (See Comments)    Reaction not recalled    Consultations:  Neurology   Inpatient Rehab.    Procedures/Studies: Mr [name] KG Contrast  Result Date: 12/31/2018 CLINICAL DATA:  Initial evaluation for acute intermittent dizziness for 3 days. EXAM: MRI HEAD WITHOUT CONTRAST MRA HEAD WITHOUT CONTRAST MRA NECK WITHOUT CONTRAST TECHNIQUE: Multiplanar, multiecho pulse sequences of the brain and surrounding structures were obtained without intravenous contrast. Angiographic images of the Circle of Willis were obtained using MRA technique without intravenous contrast. Angiographic images of the neck were obtained using MRA technique without intravenous contrast. Carotid stenosis measurements (when applicable) are obtained utilizing NASCET criteria, using the distal internal carotid diameter as the denominator. COMPARISON:  None. FINDINGS: MRI HEAD FINDINGS Brain: Advanced age-related cerebral atrophy. Minimal T2/FLAIR  hyperintensity within the periventricular white matter, most likely related to chronic microvascular ischemic disease, felt to be within normal limits for age. 6 mm focus of diffusion abnormality at the left occipital lobe compatible with a small acute ischemic infarct (series 3, image 18). No associated hemorrhage or mass effect. No other evidence for acute or subacute ischemia. Gray-white matter differentiation otherwise maintained. No encephalomalacia to suggest chronic cortical infarction. No foci of susceptibility artifact to suggest acute or chronic intracranial hemorrhage. No mass lesion,  midline shift or mass effect. Diffuse ventricular prominence related to global parenchymal volume loss without hydrocephalus. No extra-axial fluid collection. Pituitary gland normal. Vascular: Major intracranial vascular flow voids maintained. Skull and upper cervical spine: Prominent degenerative thickening at the tectorial membrane with resultant severe stenosis at the craniocervical junction and flattening of the right greater than left upper cervical spinal cord (series 13, image 6). No visible cord signal changes on this exam. Remainder the visualized cervical spine demonstrates no acute finding. Bone marrow signal intensity within normal limits. No scalp soft tissue abnormality. Sinuses/Orbits: Patient status post bilateral ocular lens replacement. Mild scattered mucoperiosteal thickening throughout the paranasal sinuses without evidence for acute sinusitis. No mastoid effusion. Inner ear structures normal. Other: None. MRA HEAD FINDINGS ANTERIOR CIRCULATION: Distal cervical segments of the internal carotid arteries are patent with symmetric antegrade flow. Petrous segments widely patent bilaterally. Cavernous segments widely patent bilaterally. Moderate narrowing at the supraclinoid segments bilaterally, right slightly greater than left. ICA termini perfused. A1 segment patent. Right A1 not visualized, which could be either hypoplastic and/or absent. Grossly normal anterior communicating artery. Anterior cerebral arteries mildly irregular but patent to their distal aspects without significant stenosis. Mild atheromatous irregularity within the right M1 segment without high-grade stenosis. Normal right MCA bifurcation. Distal right MCA branches well perfused. On the left, there is a severe near occlusive stenosis involving the proximal-mid left M1 segment, measuring approximately 7 mm in length (series 404, image 11). Left M1 narrowed but patent distally. Severe stenosis at the proximal of the left M2 inferior  division. Left MCA branches otherwise perfused distally. POSTERIOR CIRCULATION: Left vertebral artery mildly irregular but patent to the vertebrobasilar junction without high-grade stenosis. Patent left PICA. Right vertebral artery occluded at the skull base. Some retrograde filling into the distal right V4 segment across the vertebrobasilar junction. Right PICA not visualized. Moderate to severe segmental stenoses involving the proximal and distal basilar artery (series 406, image 11). Superior cerebral arteries grossly patent bilaterally. Both of the PCAs primarily supplied via the basilar. Additional multifocal severe segmental stenoses involving the proximal and mid P2 segments seen bilaterally. PCAs are perfused to their distal aspects. MRA NECK FINDINGS Examination technically limited by lack of IV contrast. Partially visualized aortic arch within normal limits for caliber with normal 3 vessel morphology. No appreciable flow-limiting stenosis about the origin of the great vessels. Partially visualized subclavian arteries patent bilaterally. Right CCA patent from its origin to the bifurcation without obvious flow-limiting stenosis. Atheromatous plaque about the right bifurcation with associated stenosis of approximately 50-60% by NASCET criteria. Right ICA otherwise patent distally. Left CCA tortuous proximally but grossly patent to the bifurcation without appreciable flow-limiting stenosis. No significant atheromatous narrowing about the left bifurcation. Left ICA mildly tortuous but otherwise patent. Both of the vertebral arteries arise from the subclavian arteries. Left vertebral artery dominant and widely patent within the neck. Right vertebral artery diminutive with scant irregular flow within the right V2 segment. Right vertebral otherwise occluded within the neck. IMPRESSION:  MRI HEAD IMPRESSION: 1. 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 2. Pronounced degenerative  thickening at the tectorial membrane with resultant severe spinal stenosis at the cranial cervical junction and impingement upon the upper cervical spinal cord. No definite cord signal changes evident on this exam. 3. Underlying age-related cerebral atrophy. No other acute intracranial abnormality identified. MRA HEAD IMPRESSION: 1. 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 Dominant left vertebral artery widely patent to the vertebrobasilar junction. 2. 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 3. Severe near occlusive stenosis involving the proximal-mid left M1 segment, with additional downstream proximal left M2 stenosis. 4. Moderate diffuse atheromatous narrowing involving the supraclinoid ICAs bilaterally. MRA NECK IMPRESSION: 1. 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 Dominant left vertebral artery widely patent with antegrade flow. 2. 50-60% atheromatous stenosis at the right carotid bifurcation/origin of the right ICA. Right carotid artery system otherwise patent. 3. No hemodynamically significant stenosis within the left carotid artery system within the neck. Electronically Signed   By: Jeannine Boga M.D.   On: 12/31/2018 18:39   Mr Jodene Nam Neck Wo Contrast  Result Date: 12/31/2018 CLINICAL DATA:  Initial evaluation for acute intermittent dizziness for 3 days. EXAM: MRI HEAD WITHOUT CONTRAST MRA HEAD WITHOUT CONTRAST MRA NECK WITHOUT CONTRAST TECHNIQUE: Multiplanar, multiecho pulse sequences of the brain and surrounding structures were obtained without intravenous contrast. Angiographic images of the Circle of Willis were obtained using MRA technique without intravenous contrast. Angiographic images of the neck were obtained using MRA technique without intravenous contrast. Carotid stenosis measurements (when applicable) are obtained utilizing NASCET criteria, using  the distal internal carotid diameter as the denominator. COMPARISON:  None. FINDINGS: MRI HEAD FINDINGS Brain: Advanced age-related cerebral atrophy. Minimal T2/FLAIR hyperintensity within the periventricular white matter, most likely related to chronic microvascular ischemic disease, felt to be within normal limits for age. 6 mm focus of diffusion abnormality at the left occipital lobe compatible with a small acute ischemic infarct (series 3, image 18). No associated hemorrhage or mass effect. No other evidence for acute or subacute ischemia. Gray-white matter differentiation otherwise maintained. No encephalomalacia to suggest chronic cortical infarction. No foci of susceptibility artifact to suggest acute or chronic intracranial hemorrhage. No mass lesion, midline shift or mass effect. Diffuse ventricular prominence related to global parenchymal volume loss without hydrocephalus. No extra-axial fluid collection. Pituitary gland normal. Vascular: Major intracranial vascular flow voids maintained. Skull and upper cervical spine: Prominent degenerative thickening at the tectorial membrane with resultant severe stenosis at the craniocervical junction and flattening of the right greater than left upper cervical spinal cord (series 13, image 6). No visible cord signal changes on this exam. Remainder the visualized cervical spine demonstrates no acute finding. Bone marrow signal intensity within normal limits. No scalp soft tissue abnormality. Sinuses/Orbits: Patient status post bilateral ocular lens replacement. Mild scattered mucoperiosteal thickening throughout the paranasal sinuses without evidence for acute sinusitis. No mastoid effusion. Inner ear structures normal. Other: None. MRA HEAD FINDINGS ANTERIOR CIRCULATION: Distal cervical segments of the internal carotid arteries are patent with symmetric antegrade flow. Petrous segments widely patent bilaterally. Cavernous segments widely patent bilaterally. Moderate  narrowing at the supraclinoid segments bilaterally, right slightly greater than left. ICA termini perfused. A1 segment patent. Right A1 not visualized, which could be either hypoplastic and/or absent. Grossly normal anterior communicating artery. Anterior cerebral arteries mildly irregular but patent to their distal  aspects without significant stenosis. Mild atheromatous irregularity within the right M1 segment without high-grade stenosis. Normal right MCA bifurcation. Distal right MCA branches well perfused. On the left, there is a severe near occlusive stenosis involving the proximal-mid left M1 segment, measuring approximately 7 mm in length (series 404, image 11). Left M1 narrowed but patent distally. Severe stenosis at the proximal of the left M2 inferior division. Left MCA branches otherwise perfused distally. POSTERIOR CIRCULATION: Left vertebral artery mildly irregular but patent to the vertebrobasilar junction without high-grade stenosis. Patent left PICA. Right vertebral artery occluded at the skull base. Some retrograde filling into the distal right V4 segment across the vertebrobasilar junction. Right PICA not visualized. Moderate to severe segmental stenoses involving the proximal and distal basilar artery (series 406, image 11). Superior cerebral arteries grossly patent bilaterally. Both of the PCAs primarily supplied via the basilar. Additional multifocal severe segmental stenoses involving the proximal and mid P2 segments seen bilaterally. PCAs are perfused to their distal aspects. MRA NECK FINDINGS Examination technically limited by lack of IV contrast. Partially visualized aortic arch within normal limits for caliber with normal 3 vessel morphology. No appreciable flow-limiting stenosis about the origin of the great vessels. Partially visualized subclavian arteries patent bilaterally. Right CCA patent from its origin to the bifurcation without obvious flow-limiting stenosis. Atheromatous plaque  about the right bifurcation with associated stenosis of approximately 50-60% by NASCET criteria. Right ICA otherwise patent distally. Left CCA tortuous proximally but grossly patent to the bifurcation without appreciable flow-limiting stenosis. No significant atheromatous narrowing about the left bifurcation. Left ICA mildly tortuous but otherwise patent. Both of the vertebral arteries arise from the subclavian arteries. Left vertebral artery dominant and widely patent within the neck. Right vertebral artery diminutive with scant irregular flow within the right V2 segment. Right vertebral otherwise occluded within the neck. IMPRESSION: MRI HEAD IMPRESSION: 1. 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 2. Pronounced degenerative thickening at the tectorial membrane with resultant severe spinal stenosis at the cranial cervical junction and impingement upon the upper cervical spinal cord. No definite cord signal changes evident on this exam. 3. Underlying age-related cerebral atrophy. No other acute intracranial abnormality identified. MRA HEAD IMPRESSION: 1. 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 Dominant left vertebral artery widely patent to the vertebrobasilar junction. 2. 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 3. Severe near occlusive stenosis involving the proximal-mid left M1 segment, with additional downstream proximal left M2 stenosis. 4. Moderate diffuse atheromatous narrowing involving the supraclinoid ICAs bilaterally. MRA NECK IMPRESSION: 1. 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 Dominant left vertebral artery widely patent with antegrade flow. 2. 50-60% atheromatous stenosis at the right carotid bifurcation/origin of the right ICA. Right carotid artery system otherwise patent. 3. No hemodynamically significant stenosis within  the left carotid artery system within the neck. Electronically Signed   By: Jeannine Boga M.D.   On: 12/31/2018 18:39   Mr Brain Wo Contrast  Result Date: 12/31/2018 CLINICAL DATA:  Initial evaluation for acute intermittent dizziness for 3 days. EXAM: MRI HEAD WITHOUT CONTRAST MRA HEAD WITHOUT CONTRAST MRA NECK WITHOUT CONTRAST TECHNIQUE: Multiplanar, multiecho pulse sequences of the brain and surrounding structures were obtained without intravenous contrast. Angiographic images of the Circle of Willis were obtained using MRA technique without intravenous contrast. Angiographic images of the neck were obtained using MRA technique without intravenous contrast. Carotid stenosis measurements (  when applicable) are obtained utilizing NASCET criteria, using the distal internal carotid diameter as the denominator. COMPARISON:  None. FINDINGS: MRI HEAD FINDINGS Brain: Advanced age-related cerebral atrophy. Minimal T2/FLAIR hyperintensity within the periventricular white matter, most likely related to chronic microvascular ischemic disease, felt to be within normal limits for age. 6 mm focus of diffusion abnormality at the left occipital lobe compatible with a small acute ischemic infarct (series 3, image 18). No associated hemorrhage or mass effect. No other evidence for acute or subacute ischemia. Gray-white matter differentiation otherwise maintained. No encephalomalacia to suggest chronic cortical infarction. No foci of susceptibility artifact to suggest acute or chronic intracranial hemorrhage. No mass lesion, midline shift or mass effect. Diffuse ventricular prominence related to global parenchymal volume loss without hydrocephalus. No extra-axial fluid collection. Pituitary gland normal. Vascular: Major intracranial vascular flow voids maintained. Skull and upper cervical spine: Prominent degenerative thickening at the tectorial membrane with resultant severe stenosis at the craniocervical junction and  flattening of the right greater than left upper cervical spinal cord (series 13, image 6). No visible cord signal changes on this exam. Remainder the visualized cervical spine demonstrates no acute finding. Bone marrow signal intensity within normal limits. No scalp soft tissue abnormality. Sinuses/Orbits: Patient status post bilateral ocular lens replacement. Mild scattered mucoperiosteal thickening throughout the paranasal sinuses without evidence for acute sinusitis. No mastoid effusion. Inner ear structures normal. Other: None. MRA HEAD FINDINGS ANTERIOR CIRCULATION: Distal cervical segments of the internal carotid arteries are patent with symmetric antegrade flow. Petrous segments widely patent bilaterally. Cavernous segments widely patent bilaterally. Moderate narrowing at the supraclinoid segments bilaterally, right slightly greater than left. ICA termini perfused. A1 segment patent. Right A1 not visualized, which could be either hypoplastic and/or absent. Grossly normal anterior communicating artery. Anterior cerebral arteries mildly irregular but patent to their distal aspects without significant stenosis. Mild atheromatous irregularity within the right M1 segment without high-grade stenosis. Normal right MCA bifurcation. Distal right MCA branches well perfused. On the left, there is a severe near occlusive stenosis involving the proximal-mid left M1 segment, measuring approximately 7 mm in length (series 404, image 11). Left M1 narrowed but patent distally. Severe stenosis at the proximal of the left M2 inferior division. Left MCA branches otherwise perfused distally. POSTERIOR CIRCULATION: Left vertebral artery mildly irregular but patent to the vertebrobasilar junction without high-grade stenosis. Patent left PICA. Right vertebral artery occluded at the skull base. Some retrograde filling into the distal right V4 segment across the vertebrobasilar junction. Right PICA not visualized. Moderate to severe  segmental stenoses involving the proximal and distal basilar artery (series 406, image 11). Superior cerebral arteries grossly patent bilaterally. Both of the PCAs primarily supplied via the basilar. Additional multifocal severe segmental stenoses involving the proximal and mid P2 segments seen bilaterally. PCAs are perfused to their distal aspects. MRA NECK FINDINGS Examination technically limited by lack of IV contrast. Partially visualized aortic arch within normal limits for caliber with normal 3 vessel morphology. No appreciable flow-limiting stenosis about the origin of the great vessels. Partially visualized subclavian arteries patent bilaterally. Right CCA patent from its origin to the bifurcation without obvious flow-limiting stenosis. Atheromatous plaque about the right bifurcation with associated stenosis of approximately 50-60% by NASCET criteria. Right ICA otherwise patent distally. Left CCA tortuous proximally but grossly patent to the bifurcation without appreciable flow-limiting stenosis. No significant atheromatous narrowing about the left bifurcation. Left ICA mildly tortuous but otherwise patent. Both of the vertebral arteries arise from the subclavian arteries. Left  vertebral artery dominant and widely patent within the neck. Right vertebral artery diminutive with scant irregular flow within the right V2 segment. Right vertebral otherwise occluded within the neck. IMPRESSION: MRI HEAD IMPRESSION: 1. 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 2. Pronounced degenerative thickening at the tectorial membrane with resultant severe spinal stenosis at the cranial cervical junction and impingement upon the upper cervical spinal cord. No definite cord signal changes evident on this exam. 3. Underlying age-related cerebral atrophy. No other acute intracranial abnormality identified. MRA HEAD IMPRESSION: 1. Nonvisualization of the right vertebral artery, which is largely  occluded within the neck. Dominant left vertebral artery widely patent to the vertebrobasilar junction. 2. 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 3. Severe near occlusive stenosis involving the proximal-mid left M1 segment, with additional downstream proximal left M2 stenosis. 4. Moderate diffuse atheromatous narrowing involving the supraclinoid ICAs bilaterally. MRA NECK IMPRESSION: 1. 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 Dominant left vertebral artery widely patent with antegrade flow. 2. 50-60% atheromatous stenosis at the right carotid bifurcation/origin of the right ICA. Right carotid artery system otherwise patent. 3. No hemodynamically significant stenosis within the left carotid artery system within the neck. Electronically Signed   By: Jeannine Boga M.D.   On: 12/31/2018 18:39       Subjective: Patient is feeling better, continue to have dizziness when moving, but less intense than yesterday, no nausea or vomiting, no chest pain or dyspnea.   Discharge Exam: Vitals:   01/03/19 0326 01/03/19 0826  BP: 131/84 95/74  Pulse: 88 87  Resp: 17 20  Temp: (!) 97.4 F (36.3 C) 98.5 F (36.9 C)  SpO2: 100% 97%   Vitals:   01/02/19 2335 01/02/19 2337 01/03/19 0326 01/03/19 0826  BP: (!) 159/102 (!) 142/86 131/84 95/74  Pulse: 90 89 88 87  Resp: 16 16 17 20   Temp: 97.8 F (36.6 C)  (!) 97.4 F (36.3 C) 98.5 F (36.9 C)  TempSrc: Oral  Oral Oral  SpO2: 96% 94% 100% 97%  Weight:      Height:        General: Not in pain or dyspnea.  Neurology: Awake and alert, non focal  E ENT: no pallor, no icterus, oral mucosa moist Cardiovascular: No JVD. S1-S2 present, rhythmic, no gallops, rubs, or murmurs. No lower extremity edema. Pulmonary: vesicular breath sounds bilaterally, adequate air movement, no wheezing, rhonchi or rales. Gastrointestinal. Abdomen with no organomegaly, non tender, no  rebound or guarding Skin. No rashes Musculoskeletal: no joint deformities   The results of significant diagnostics from this hospitalization (including imaging, microbiology, ancillary and laboratory) are listed below for reference.     Microbiology: Recent Results (from the past 240 hour(s))  Urine culture     Status: None   Collection Time: 12/31/18  2:00 PM  Result Value Ref Range Status   Specimen Description URINE, CLEAN CATCH  Final   Special Requests NONE  Final   Culture   Final    NO GROWTH Performed at [facility], [address], [place], [place] [address]    Report Status 01/01/2019 FINAL  Final     Labs: BNP (last 3 results) No results for input(s): BNP in the last 8760 hours. Basic Metabolic Panel: Recent Labs  Lab 12/31/18 1227 01/01/19 0421 01/02/19 0540 01/03/19 0514  NA 141 143 142 142  K 4.9 4.0 4.1 4.0  CL  105 109 108 106  CO2 27 25 27 25   GLUCOSE 156* 122* 123* 117*  BUN 16 16 17 16   CREATININE 1.56* 1.58* 1.53* 1.61*  CALCIUM 9.7 9.3 8.9 8.8*  MG  --  2.0  --   --   PHOS  --  3.9  --   --    Liver Function Tests: Recent Labs  Lab 01/01/19 0421  AST 16  ALT 13  ALKPHOS 45  BILITOT 0.8  PROT 5.5*  ALBUMIN 3.0*   No results for input(s): LIPASE, AMYLASE in the last 168 hours. No results for input(s): AMMONIA in the last 168 hours. CBC: Recent Labs  Lab 12/31/18 1227 01/01/19 0421  WBC 5.9 6.1  HGB 15.8 13.3  HCT 48.3 40.3  MCV 98.4 97.1  PLT 269 263   Cardiac Enzymes: No results for input(s): CKTOTAL, CKMB, CKMBINDEX, TROPONINI in the last 168 hours. BNP: Invalid input(s): POCBNP CBG: Recent Labs  Lab 12/31/18 1320  GLUCAP 154*   D-Dimer No results for input(s): DDIMER in the last 72 hours. Hgb A1c Recent Labs    01/01/19 0421  HGBA1C 6.2*   Lipid Profile Recent Labs    01/01/19 0421  CHOL 252*  HDL 34*  LDLCALC 177*  TRIG 204*  CHOLHDL 7.4   Thyroid function studies Recent Labs     12/31/18 1428  TSH 2.124   Anemia work up No results for input(s): VITAMINB12, FOLATE, FERRITIN, TIBC, IRON, RETICCTPCT in the last 72 hours. Urinalysis    Component Value Date/Time   COLORURINE YELLOW 12/31/2018 1227   APPEARANCEUR CLEAR 12/31/2018 1227   LABSPEC 1.009 12/31/2018 1227   PHURINE 7.0 12/31/2018 1227   GLUCOSEU NEGATIVE 12/31/2018 1227   HGBUR NEGATIVE 12/31/2018 1227   [place] 12/31/2018 [place] 12/31/2018 1227   PROTEINUR NEGATIVE 12/31/2018 1227   UROBILINOGEN 0.2 08/27/2015 1930   NITRITE NEGATIVE 12/31/2018 1227   LEUKOCYTESUR NEGATIVE 12/31/2018 1227   Sepsis Labs Invalid input(s): PROCALCITONIN,  WBC,  LACTICIDVEN Microbiology Recent Results (from the past 240 hour(s))  Urine culture     Status: None   Collection Time: 12/31/18  2:00 PM  Result Value Ref Range Status   Specimen Description URINE, CLEAN CATCH  Final   Special Requests NONE  Final   Culture   Final    NO GROWTH Performed at [facility], [place] [address] [place], [place] [address]    Report Status 01/01/2019 FINAL  Final     Time coordinating discharge: 45 minutes  SIGNED:   Tawni Millers, MD  [facility] 01/03/2019, 9:43 AM

## 2019-01-03 NOTE — Interval H&P Note (Signed)
Robert Rowe was admitted today to Inpatient Rehabilitation with the diagnosis of stroke.  The patient's history has been reviewed, patient examined, and there is no change in status.  Patient continues to be appropriate for intensive inpatient rehabilitation.  I have reviewed the patient's chart and labs.  Questions were answered to the patient's satisfaction. The PAPE has been reviewed and assessment remains appropriate.  Robert Rowe 01/03/2019, 3:47 PM

## 2019-01-03 NOTE — PMR Pre-admission (Signed)
PMR Admission Coordinator Pre-Admission Assessment  Patient: Robert Rowe is an 85 y.o., male MRN: [id number] DOB: 1918-11-10 Height: _0  (157.5 cm) Weight: 65.8 kg              Insurance Information HMO: No    PPO:       PCP:       IPA:       80/20:       OTHER:   PRIMARY: UHC medicare      Policy#: [id number]      Subscriber: patient CM Name: Junie Panning      Phone#:       Fax#: [phone] Pre-Cert#: [id number]      Employer:  Retired Benefits:  Phone #: [phone]     Name:  On line portal Eff. 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 Presented to 12/31/2018 with vertigo as well as deafness right ear 2-3 days. Patient had made plans to see an ENT. There were reports of a fall in the bathroom with no loss of consciousness. Upon arrival patient's blood pressure 201/97. Patient had been seen approximately 6 months ago by Dr. Delice Lesch for episodes of syncope/BPPV as well as bilateral leg weakness and slurred speech. 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  Recurrent symptoms were concerning for hypoperfusion causing TIA. Patient was maintained on Plavix. 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  Echocardiogram pending. Troponin was negative. Patient currently is maintained on aspirin and Plavix for CVA prophylaxis. Tolerating a regular diet. Therapy evaluation completed with recommendations of physical medicine rehabilitation consult.  Complete NIHSS TOTAL: 1  Past Medical History  Past Medical History:  Diagnosis Date  . BENIGN PROSTATIC HYPERTROPHY 04/05/2007  . 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 CHEST DISCOMFORT 04/25/2009  . Chronic diastolic CHF (congestive heart failure) ([place])    a. Echo 12/16: mild LVH, EF 55-60%, no RWMA, Gr 1 DD, mild AI, MAC, mild LAE, normal RVF, PASP 37 mmHg  . Chronic kidney disease    chronic   . DJD (degenerative joint disease)   . Dysrhythmia    pvc's   . GERD 12/03/2007  . Hiatal hernia   . History of nuclear stress test  Myoview 1/17: EF 53%, normal perfusion; Low Risk  . HYPERLIPIDEMIA 04/05/2007  . Hypothyroidism   . Osteoarth NOS-Unspec 04/05/2007  . Palpitations 02/12/2010  . PEDAL EDEMA 05/01/2008  . Rheumatoid arthritis(714.0) 04/05/2007  . Shortness of breath dyspnea   . TRANSIENT ISCHEMIC ATTACK, HX OF 04/05/2007   ?     Family History  family history includes Breast cancer in his mother; Heart attack in his father; Heart disease in his father; Lymphoma in his brother.  Prior Rehab/Hospitalizations: No previous rehab  Has the patient had major surgery during 100 days prior to admission? No  Current Medications   Current Facility-Administered Medications:  .  acetaminophen (TYLENOL) tablet 650  mg, 650 mg, Oral, Q6H PRN **OR** acetaminophen (TYLENOL) suppository 650 mg, 650 mg, Rectal, Q6H PRN, Doutova, Anastassia, MD .  albuterol (PROVENTIL) (2.5 MG/3ML) 0.083% nebulizer solution 2.5 mg, 2.5 mg, Nebulization, Q2H PRN, Doutova, Anastassia, MD .  aspirin EC tablet 81 mg, 81 mg, Oral, Daily, Arrien, Jimmy Picket, MD, 81 mg at 01/03/19 1013 .  atorvastatin (LIPITOR) tablet 80 mg, 80 mg, Oral, q1800, Doutova, Anastassia, MD, 80 mg at 01/02/19 1832 .  clopidogrel (PLAVIX) tablet 75 mg, 75 mg, Oral, Daily, Doutova, Anastassia, MD, 75 mg at 01/03/19 1013 .  latanoprost (XALATAN) 0.005 % ophthalmic solution 1 drop, 1 drop, Both Eyes, QHS, Doutova, Anastassia, MD, 1 drop at 01/02/19 2242 .  levothyroxine (SYNTHROID, LEVOTHROID) tablet 50 mcg, 50 mcg, Oral, QAC breakfast, Doutova, Anastassia, MD, 50 mcg at 01/03/19 9024 .  metoprolol succinate (TOPROL-XL) 24 hr tablet 25 mg, 25 mg, Oral, Daily, Arrien, Jimmy Picket, MD, 25 mg at 01/02/19 1846 .  ondansetron (ZOFRAN) tablet 4 mg, 4 mg, Oral, Q6H PRN **OR** ondansetron (ZOFRAN) injection 4 mg, 4 mg, Intravenous, Q6H PRN, Doutova, Anastassia, MD .  pantoprazole (PROTONIX) EC tablet 40 mg, 40 mg, Oral, QAC breakfast, Doutova, Anastassia, MD, 40 mg at 01/03/19 0973  Patients Current Diet:  Diet Order            Diet - low sodium heart healthy        Diet Heart Room service appropriate? Yes; Fluid consistency: Thin  Diet effective now              Precautions / Restrictions Precautions Precautions: Fall Precaution Comments: dizzy with all motion Restrictions Weight Bearing Restrictions: No   Has the patient had 2 or more falls or a fall with injury in the past year?No.  Patient had 1 fall recently associated with current stroke.  Prior Activity Level Limited Community (1-2x/wk): Went out 2-3 X a week.  Not driving.  Home Assistive Devices / Equipment Home Equipment: Walker - 2 wheels  Prior Device Use: Indicate devices/aids used  by the patient prior to current illness, exacerbation or injury? Walker.  He has only used the walker 1 X most recently.  Prior Functional Level Prior Function Level of Independence: Independent Comments: ADLs and simple IADLs. used no AD in house, used RW occasionally outside of home  Self Care: Did the patient need help bathing, dressing, using the toilet or eating?  Independent  Indoor Mobility: Did the patient need assistance with walking from room to room (with or without device)? Independent  Stairs: Did the patient need assistance with internal or external stairs (with or without device)? Independent  Functional Cognition: Did the patient need help planning regular tasks such as shopping or remembering to take medications? Independent  Current Functional Level Cognition  Arousal/Alertness: Awake/alert Overall Cognitive Status:  Impaired/Different from baseline Orientation Level: Oriented X4 General Comments: pt repeatedly stating that he can't remember anything (which is not his baseline cognition) Attention: Focused Focused Attention: Appears intact Memory: Impaired Memory Impairment: Retrieval deficit Awareness: Appears intact Problem Solving: Appears intact Executive Function: Reasoning Reasoning: Appears intact Safety/Judgment: Appears intact    Extremity Assessment (includes Sensation/Coordination)  Upper Extremity Assessment: Generalized weakness  Lower Extremity Assessment: Defer to PT evaluation    ADLs  Overall ADL's : Needs assistance/impaired Eating/Feeding: Supervision/ safety, Set up, Sitting Grooming: Wash/dry face, Min guard, Standing, Minimal assistance Upper Body Bathing: Min guard, Sitting Lower Body Bathing: Minimal assistance, +2 for safety/equipment, Sit to/from stand Lower Body Bathing Details (indicate cue type and reason): Pt performing peri care while at sink and required Min A for balance and gown management. Upper Body Dressing : Min guard,  Sitting Upper Body Dressing Details (indicate cue type and reason): Donning second gown like a jacket Lower Body Dressing: Minimal assistance, Sit to/from stand Lower Body Dressing Details (indicate cue type and reason): Pt able to bend forward to adjust socks but became dizzy Toilet Transfer: Minimal assistance, Ambulation, RW(simulated to recliner) Toileting - Clothing Manipulation Details (indicate cue type and reason): Pt performing peri care while at sink and required Min A for balance and gown management. Functional mobility during ADLs: Minimal assistance, Rolling walker General ADL Comments: Pt presenting with dizziness, decreased strength, poor balance, and decreased ST memory.    Mobility  General bed mobility comments: pt sitting EOB upon arrival    Transfers  Overall transfer level: Needs assistance Equipment used: Rolling walker (2 wheeled) Transfers: Sit to/from Stand Sit to Stand: Min guard Stand pivot transfers: Mod assist General transfer comment: min guard for safety; pt pulling up on RW with bilateral UEs despite tactile and verbal cueing for safe hand placement    Ambulation / Gait / Stairs / Wheelchair Mobility  Ambulation/Gait Ambulation/Gait assistance: Min assist, Min guard Gait Distance (Feet): 15 Feet Assistive device: Rolling walker (2 wheeled) Gait Pattern/deviations: Step-to pattern, Step-through pattern, Decreased step length - right, Decreased step length - left, Decreased stride length, Trunk flexed General Gait Details: pt with mild instability with ambulation, limited secondary to fatigue and weakness. Pt ambulated within room with min guard to min A level for safety and stability Gait velocity: decreased    Posture / Balance Dynamic Sitting Balance Sitting balance - Comments: pt able to sit EOB with min guard and adjust socks without difficulties Balance Overall balance assessment: Needs assistance Sitting-balance support: Feet supported Sitting  balance-Leahy Scale: Fair Sitting balance - Comments: pt able to sit EOB with min guard and adjust socks without difficulties Standing balance support: Bilateral upper extremity supported, Single extremity supported Standing balance-Leahy Scale: Poor Standing balance comment: pt able to stand and sink and perform ADLs tasks with close min guard and occasional min A with 1-2 UE supports    Special needs/care consideration BiPAP/CPAP No CPM No Continuous Drip IV No Dialysis No     Life Vest No Oxygen No Special Bed No Trach Size No Wound Vac (area) No  Skin No             Bowel mgmt: Last Bm 01/01/19 Bladder mgmt: Voiding in urinal Diabetic mgmt No Additional:  Has decreased hearing R ear, dizziness.  Has memory has decreased in the last 2 years per patient.      Previous Home Environment Living Arrangements: Spouse/significant other  Lives With: Spouse Available Help at Discharge: Family, Available  24 hours/day Type of Home: House Home Layout: Two level(chair lift) Alternate Level Stairs-Number of Steps: flight Home Access: Stairs to enter Entrance Stairs-Rails: Right Entrance Stairs-Number of Steps: 4 Bathroom Shower/Tub: Chiropodist: Standard  Discharge Living Setting Plans for Discharge Living Setting: Lives with (comment), Other (Comment)(Lives with wife in a condominium) Type of Home at Discharge: Other (Comment)(condominium) Discharge Home Layout: Two level, 1/2 bath on main level, Bed/bath upstairs Alternate Level Stairs-Number of Steps: 11 steps Discharge Home Access: Stairs to enter Entrance Stairs-Number of Steps: 6 steps Discharge Bathroom Shower/Tub: Tub/shower unit, Curtain Discharge Bathroom Toilet: Standard Discharge Bathroom Accessibility: Yes How Accessible: Accessible via walker  Social/Family/Support Systems Patient Roles: Spouse, Parent(Has a wife and 3 sons, 1 daughter.) Contact Information: Finnegan Gatta - wife Anticipated  Caregiver: wife Anticipated Caregiver's Contact Information: Soyla Murphy - wife - [phone] Ability/Limitations of Caregiver: Wife is retired, is younger and can assist. Caregiver Availability: 24/7 Discharge Plan Discussed with Primary Caregiver: Yes Is Caregiver In Agreement with Plan?: Yes Does Caregiver/Family have Issues with Lodging/Transportation while Pt is in Rehab?: No  Goals/Additional Needs Patient/Family Goal for Rehab: PT/OT mod I goals Expected length of stay: 7-11 days Cultural Considerations: None Dietary Needs: Heart diet, thin liquids Equipment Needs: TBD Pt/Family Agrees to Admission and willing to participate: Yes Program Orientation Provided & Reviewed with Pt/Caregiver Including Roles  & Responsibilities: Yes  Decrease burden of Care through IP rehab admission: N/A  Possible need for SNF placement upon discharge: Not anticipated  Patient Condition: This patient's medical and functional status has changed since the consult dated: 01/01/19 in which the Rehabilitation Physician determined and documented that the patient's condition is appropriate for intensive rehabilitative care in an inpatient rehabilitation facility. See "History of Present Illness" (above) for medical update. Functional changes are: Currently requiring min to min guard to ambulate 15 feet RW . Patient's medical and functional status update has been discussed with the Rehabilitation physician and patient remains appropriate for inpatient rehabilitation. Will admit to inpatient rehab today.  Preadmission Screen Completed By:  Retta Diones, 01/03/2019 10:57 AM ______________________________________________________________________   Discussed status with Dr. Letta Pate on 01/03/19 at 1057 and received telephone approval for admission today.  Admission Coordinator:  Retta Diones, time 1057/Date 01/03/19

## 2019-01-04 ENCOUNTER — Inpatient Hospital Stay (HOSPITAL_COMMUNITY): Payer: Medicare Other | Admitting: Physical Therapy

## 2019-01-04 ENCOUNTER — Inpatient Hospital Stay (HOSPITAL_COMMUNITY): Payer: Medicare Other | Admitting: Occupational Therapy

## 2019-01-04 DIAGNOSIS — I639 Cerebral infarction, unspecified: Secondary | ICD-10-CM

## 2019-01-04 DIAGNOSIS — I5032 Chronic diastolic (congestive) heart failure: Secondary | ICD-10-CM

## 2019-01-04 DIAGNOSIS — G450 Vertebro-basilar artery syndrome: Secondary | ICD-10-CM

## 2019-01-04 LAB — CBC WITH DIFFERENTIAL/PLATELET
Abs Immature Granulocytes: 0.02 10*3/uL (ref 0.00–0.07)
Basophils Absolute: 0 10*3/uL (ref 0.0–0.1)
Basophils Relative: 1 %
Eosinophils Absolute: 0.2 10*3/uL (ref 0.0–0.5)
Eosinophils Relative: 4 %
HCT: 42.5 % (ref 39.0–52.0)
Hemoglobin: 13.5 g/dL (ref 13.0–17.0)
Immature Granulocytes: 0 %
Lymphocytes Relative: 25 %
Lymphs Abs: 1.5 10*3/uL (ref 0.7–4.0)
MCH: 30.8 pg (ref 26.0–34.0)
MCHC: 31.8 g/dL (ref 30.0–36.0)
MCV: 97 fL (ref 80.0–100.0)
Monocytes Absolute: 0.8 10*3/uL (ref 0.1–1.0)
Monocytes Relative: 13 %
Neutro Abs: 3.3 10*3/uL (ref 1.7–7.7)
Neutrophils Relative %: 57 %
Platelets: 225 10*3/uL (ref 150–400)
RBC: 4.38 MIL/uL (ref 4.22–5.81)
RDW: 12.8 % (ref 11.5–15.5)
WBC: 5.9 10*3/uL (ref 4.0–10.5)
nRBC: 0 % (ref 0.0–0.2)

## 2019-01-04 LAB — COMPREHENSIVE METABOLIC PANEL
ALT: 10 U/L (ref 0–44)
AST: 15 U/L (ref 15–41)
Albumin: 2.9 g/dL — ABNORMAL LOW (ref 3.5–5.0)
Alkaline Phosphatase: 52 U/L (ref 38–126)
Anion gap: 9 (ref 5–15)
BUN: 21 mg/dL (ref 8–23)
CO2: 24 mmol/L (ref 22–32)
Calcium: 9 mg/dL (ref 8.9–10.3)
Chloride: 105 mmol/L (ref 98–111)
Creatinine, Ser: 1.63 mg/dL — ABNORMAL HIGH (ref 0.61–1.24)
GFR calc Af Amer: 40 mL/min — ABNORMAL LOW (ref >60)
GFR calc non Af Amer: 35 mL/min — ABNORMAL LOW (ref >60)
Glucose, Bld: 128 mg/dL — ABNORMAL HIGH (ref 70–99)
Potassium: 3.8 mmol/L (ref 3.5–5.1)
Sodium: 138 mmol/L (ref 135–145)
Total Bilirubin: 0.9 mg/dL (ref 0.3–1.2)
Total Protein: 5.3 g/dL — ABNORMAL LOW (ref 6.5–8.1)

## 2019-01-04 MED ORDER — ASPIRIN EC 81 MG PO TBEC
81.0000 mg | DELAYED_RELEASE_TABLET | Freq: Every day | ORAL | Status: DC
  Administered 2019-01-04 – 2019-01-10 (×7): 81 mg via ORAL
  Filled 2019-01-04 (×7): qty 1

## 2019-01-04 NOTE — Progress Notes (Signed)
Social Work  Social Work Assessment and Plan  Patient Details  Name: Robert Rowe MRN: [id number] Date of Birth: [birth year]  Today's Date: 01/04/2019  Problem List:  Patient Active Problem List   Diagnosis Date Noted  . 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 Gait disturbance, post-stroke   . BPV (benign positional vertigo) 01/02/2019  . Acute CVA (cerebrovascular accident) ([place]) 01/01/2019  . TIA (transient ischemic attack) 12/31/2018  . CVA (cerebral vascular accident) ([place]) 12/31/2018  . Chest pain 05/15/2018  . CKD (chronic kidney disease) stage 3, GFR 30-59 ml/min ([facility]) 05/08/2018  . Other fatigue 06/13/2017  . Abnormal diffusion capacity determined by pulmonary function test 06/13/2017  . Chronic diastolic CHF (congestive heart failure) ([place]) 11/01/2016  . Essential hypertension 11/01/2016  . Intracranial vascular stenosis 11/09/2015  . Mild cognitive impairment 11/09/2015  . 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 Benign prostatic hyperplasia with urinary obstruction 03/19/2015  . Awareness alteration, transient 02/26/2015  . PVC's (premature ventricular contractions) 08/22/2013  . Hypothyroidism 05/09/2013  . Stricture and stenosis of esophagus 03/12/2013  . Dysphagia, unspecified(787.20) 02/22/2013  . DOE (dyspnea on exertion) 03/23/2011  . PALPITATIONS 02/12/2010  . CHEST DISCOMFORT 04/25/2009  . PEDAL EDEMA 05/01/2008  . GERD 12/03/2007  . Dyslipidemia 04/05/2007  . BENIGN PROSTATIC HYPERTROPHY 04/05/2007  . Rheumatoid arthritis ([place]) 04/05/2007  . Osteoarthritis 04/05/2007  . History of cardiovascular disorder 04/05/2007   Past Medical History:  Past Medical History:  Diagnosis Date  . BENIGN PROSTATIC HYPERTROPHY 04/05/2007  . 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 CHEST DISCOMFORT 04/25/2009  . Chronic diastolic CHF (congestive heart failure) ([place])    a. Echo 12/16: mild LVH, EF 55-60%, no RWMA, Gr 1 DD, mild AI, MAC, mild  LAE, normal RVF, PASP 37 mmHg  . Chronic kidney disease    chronic   . DJD (degenerative joint disease)   . Dysrhythmia    pvc's   . GERD 12/03/2007  . Hiatal hernia   . History of nuclear stress test    Myoview 1/17: EF 53%, normal perfusion; Low Risk  . HYPERLIPIDEMIA 04/05/2007  . Hypothyroidism   . Osteoarth NOS-Unspec 04/05/2007  . Palpitations 02/12/2010  . PEDAL EDEMA 05/01/2008  . Rheumatoid arthritis(714.0) 04/05/2007  . Shortness of breath dyspnea   . TRANSIENT ISCHEMIC ATTACK, HX OF 04/05/2007   ?    Past Surgical History:  Past Surgical History:  Procedure Laterality Date  . BALLOON DILATION N/A 03/12/2013   Procedure: BALLOON DILATION;  Surgeon: Inda Castle, MD;  Location: [facility];  Service: Endoscopy;  Laterality: N/A;  . CARPAL TUNNEL RELEASE  11/28/2018   Left hand  . CATARACT EXTRACTION  2001   bilateral  . CYSTOSCOPY WITH URETHRAL DILATATION N/A 09/24/2015   Procedure: CYSTOSCOPY WITH COOK BALLOON DILATATION OF URETHRAL STRICTURE ;  Surgeon: Carolan Clines, MD;  Location: [facility];  Service: Urology;  Laterality: N/A;  . CYSTOSCOPY WITH URETHRAL DILATATION N/A 07/20/2016   Procedure: CYSTOSCOPY WITH URETHRAL DILATATION;  Surgeon: Alexis Frock, MD;  Location: [facility];  Service: Urology;  Laterality: N/A;  1 HOUR  . ESOPHAGOGASTRODUODENOSCOPY N/A 03/12/2013   Procedure: ESOPHAGOGASTRODUODENOSCOPY (EGD);  Surgeon: Inda Castle, MD;  Location: [facility];  Service: Endoscopy;  Laterality: N/A;  . HEMORRHOID SURGERY    . HERNIA REPAIR     inguinal x5  . KNEE SURGERY  arthroscopic   left  . PROSTATE CRYOABLATION     2000  . SHOULDER SURGERY  few  years ago   left  . TRANSURETHRAL RESECTION OF PROSTATE N/A 03/19/2015   Procedure: TRANSURETHRAL RESECTION OF THE PROSTATE (TURP);  Surgeon: Carolan Clines, MD;  Location: [facility];  Service: Urology;  Laterality: N/A;   Social History:  reports that he quit smoking about 65 years ago. His smoking use  included cigarettes. He has never used smokeless tobacco. He reports current alcohol use. He reports that he does not use drugs.  Family / Support Systems Marital Status: Married How Long?: 56 years Patient Roles: Spouse, Parent, Other (Comment)(retired MD) Spouse/Significant Other: Webb Silversmith [phone]-home  [phone] Children: three son's and one daughter Other Supports: Synagogue members Anticipated Caregiver: Wife Ability/Limitations of Caregiver: Wife is younger than pt and able to assist and provide transportation Caregiver Availability: 24/7 Family Dynamics: Close knit family and community, many people have come in to see pt and he always has numerous people in his room. He is a very social gentleman and loves the company. Wife is here daily also  Social History Preferred language: English Religion: Jewish Cultural Background: Jewish Education: MD Read: Yes Write: Yes Employment Status: Retired Public relations account executive Issues: No issues Guardian/Conservator: None-according to MD pt is capable of amking his own decisions while here. Wife is here daily   Abuse/Neglect Abuse/Neglect Assessment Can Be Completed: Yes Physical Abuse: Denies Verbal Abuse: Denies Sexual Abuse: Denies Exploitation of patient/patient's resources: Denies Self-Neglect: Denies  Emotional Status Pt's affect, behavior and adjustment status: Pt and wife report he has always been independent and since his vertigo it has been difficult for him. But today it seems like he has had a change and is doing well and moving like he did before it happened. Recent Psychosocial Issues: other health issues managed by his PCP Psychiatric History: No history deferred depression screen due to pt is coping well and apporpriately. He is able to explain, verbalize and he feels for his age [name] is doing wonderfully.  Substance Abuse History: No issues  Patient / Family Perceptions, Expectations & Goals Pt/Family  understanding of illness & functional limitations: Pt and wife can explain his vertigo and issues involving this and the dizziness he has experienced. He talks with the MD daily and feels he has a good understanding of his treatment plan going forward. Premorbid pt/family roles/activities: Husband, father, grandfather, retiree, synagogue member, friend, etc Anticipated changes in roles/activities/participation: resume Pt/family expectations/goals: Pt states: " I am doing better today and am glad for this, I will get there."  Wife states: " I will help I can do whatever he needs."  [employer]: Other (Comment)(in the past) Premorbid Home Care/DME Agencies: Other (Comment)(has rw and cane along with a stair lift at home) Transportation available at discharge: Wife  Resource referrals recommended: Support group (specify)  Discharge Planning Living Arrangements: Spouse/significant other Support Systems: Spouse/significant other, Children, Other relatives, Friends/neighbors, Church/faith community Type of Residence: Private residence Insurance Resources: Primary school teacher) Financial Resources: Social Security Financial Screen Referred: No Living Expenses: Own Money Management: Patient, Spouse Does the patient have any problems obtaining your medications?: No Home Management: Wife and hired assist with cleaning Patient/Family Preliminary Plans: Return home with wife who is healthy and able to assist if needed. Pt is doing quite well and ambulating with a rolling walker supervision level already. Should be able to reach mod/i he is doing much bnetter wiht his dizziness and balance.  Social Work Anticipated Follow Up Needs: HH/OP, Support Group  Clinical Impression Very pleasant gentleman who is motivated to do well  and reach mod/i level. His wife is very supportive and willing to assist with his care. She plans to be here daily. He has much support through the community and  many visitors every day. Will work on discharge needs. Should be a short length of stay here due to high level.  Elease Hashimoto 01/04/2019, 2:06 PM

## 2019-01-04 NOTE — Progress Notes (Signed)
Robert Staggers, MD  Physician  Physical Medicine and Rehabilitation  Consult Note  Signed  Date of Service:  01/01/2019 1:09 PM       Related encounter: ED to Hosp-Admission (Discharged) from 12/31/2018 in [facility]      Signed      Expand All Collapse All    Show:Clear all _0 Manual_1 Template_2 Copied  Added by: _3 Cathlyn Parsons, PA-C_4 Robert Staggers, MD  _5 Hover for details      Physical Medicine and Rehabilitation Consult Reason for Consult:  Decreased functional mobility Referring Physician: [facility]   HPI: Robert Rowe is a 85 y.o.right handed male with history of TIA, PVCs hypothyroidism, chronic diastolic congestive heart failure, hypertension,CKD. Presented to 12/31/2018 with vertigo as well as deafness right ear 2-3 days. Patient had made plans to see an ENT. There were reports of a fall in the bathroom with no loss of consciousness. Upon arrival patient's blood pressure 201/97. Patient had been seen approximately 6 months ago by Dr. Delice Lesch for episodes of syncope/BPPV as well as bilateral leg weakness and slurred speech. 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 Recurrent symptoms were concerning for hypoperfusion causing TIA. Patient was maintained on Plavix. 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  Echocardiogram pending. Troponin was negative. Patient currently is maintained on aspirin and Plavix for CVA prophylaxis. Tolerating a regular diet. Therapy evaluation  completed with recommendations of physical medicine rehabilitation consult.  Review of Systems  Constitutional: Negative for chills and fever.  HENT: Positive for hearing loss.   Eyes: Positive for blurred vision.  Respiratory: Positive for shortness of breath. Negative for cough.   Cardiovascular: Positive for palpitations and leg swelling.  Gastrointestinal: Positive for constipation. Negative for nausea.       GERD  Genitourinary: Positive for urgency. Negative for dysuria and flank pain.  Musculoskeletal: Positive for myalgias.  Skin: Negative for rash.  Neurological: Positive for dizziness.       Vertigo  All other systems reviewed and are negative.  Past Medical History:  Diagnosis Date  . BENIGN PROSTATIC HYPERTROPHY 04/05/2007  . 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 CHEST DISCOMFORT 04/25/2009  . Chronic diastolic CHF (congestive heart failure) ([place])    a. Echo 12/16: mild LVH, EF 55-60%, no RWMA, Gr 1 DD, mild AI, MAC, mild LAE, normal RVF, PASP 37 mmHg  . Chronic kidney disease    chronic   . DJD (degenerative joint disease)   . Dysrhythmia    pvc's   . GERD 12/03/2007  . Hiatal hernia   . History of nuclear stress test    Myoview 1/17: EF 53%, normal perfusion; Low Risk  . HYPERLIPIDEMIA 04/05/2007  . Hypothyroidism   . Osteoarth NOS-Unspec 04/05/2007  . Palpitations 02/12/2010  . PEDAL EDEMA 05/01/2008  . Rheumatoid arthritis(714.0) 04/05/2007  . Shortness of breath dyspnea   . TRANSIENT ISCHEMIC ATTACK, HX OF 04/05/2007   ?         Past Surgical History:  Procedure Laterality Date  . BALLOON DILATION N/A 03/12/2013  Procedure: BALLOON DILATION;  Surgeon: Inda Castle, MD;  Location: [facility];  Service: Endoscopy;  Laterality: N/A;  . CARPAL TUNNEL RELEASE  11/28/2018   Left hand  . CATARACT EXTRACTION  2001   bilateral  . CYSTOSCOPY WITH URETHRAL DILATATION N/A 09/24/2015   Procedure: CYSTOSCOPY WITH COOK  BALLOON DILATATION OF URETHRAL STRICTURE ;  Surgeon: Carolan Clines, MD;  Location: [facility];  Service: Urology;  Laterality: N/A;  . CYSTOSCOPY WITH URETHRAL DILATATION N/A 07/20/2016   Procedure: CYSTOSCOPY WITH URETHRAL DILATATION;  Surgeon: Alexis Frock, MD;  Location: [facility];  Service: Urology;  Laterality: N/A;  1 HOUR  . ESOPHAGOGASTRODUODENOSCOPY N/A 03/12/2013   Procedure: ESOPHAGOGASTRODUODENOSCOPY (EGD);  Surgeon: Inda Castle, MD;  Location: [facility];  Service: Endoscopy;  Laterality: N/A;  . HEMORRHOID SURGERY    . HERNIA REPAIR     inguinal x5  . KNEE SURGERY  arthroscopic   left  . PROSTATE CRYOABLATION     2000  . SHOULDER SURGERY  few years ago   left  . TRANSURETHRAL RESECTION OF PROSTATE N/A 03/19/2015   Procedure: TRANSURETHRAL RESECTION OF THE PROSTATE (TURP);  Surgeon: Carolan Clines, MD;  Location: [facility];  Service: Urology;  Laterality: N/A;        Family History  Problem Relation Age of Onset  . Breast cancer Mother   . Heart disease Father   . Heart attack Father   . Lymphoma Brother   . Stroke Neg Hx   . Hypertension Neg Hx    Social History:  reports that he quit smoking about 65 years ago. His smoking use included cigarettes. He has never used smokeless tobacco. He reports current alcohol use. He reports that he does not use drugs. Allergies:       Allergies  Allergen Reactions  . Codeine Other (See Comments)    Patient was "all over the place"- undesired side effect  . Sulfasalazine Other (See Comments)    Reaction not recalled  . Sulfonamide Derivatives Other (See Comments)    Reaction not recalled         Medications Prior to Admission  Medication Sig Dispense Refill  . acetaminophen (TYLENOL) 500 MG tablet Take 1,000 mg by mouth daily after breakfast.    . clopidogrel (PLAVIX) 75 MG tablet TAKE 1 TABLET BY MOUTH  DAILY (Patient taking differently: Take 75 mg by mouth daily. ) 90 tablet 1  .  furosemide (LASIX) 20 MG tablet TAKE 1 TABLET BY MOUTH  DAILY AS NEEDED FOR FLUID  OR EDEMA. (Patient taking differently: Take 20-40 mg by mouth daily as needed (for fluid retention in the legs). ) 90 tablet 3  . latanoprost (XALATAN) 0.005 % ophthalmic solution Place 1 drop into both eyes at bedtime.   99  . levothyroxine (SYNTHROID, LEVOTHROID) 50 MCG tablet TAKE 1 TABLET BY MOUTH  DAILY (Patient taking differently: Take 50 mcg by mouth daily before breakfast. ) 90 tablet 2  . metoprolol succinate (TOPROL-XL) 25 MG 24 hr tablet TAKE 1 TABLET BY MOUTH AT  BEDTIME (Patient taking differently: Take 25 mg by mouth at bedtime. ) 90 tablet 1  . Multiple Vitamin (MULTIVITAMIN WITH MINERALS) TABS tablet Take 1 tablet by mouth 2 (two) times a week.     . nitroGLYCERIN (NITROSTAT) 0.4 MG SL tablet Place 0.4 mg under the tongue every 5 (five) minutes as needed (for Esophageal spasms).     . pantoprazole (PROTONIX) 40 MG tablet TAKE 1 TABLET BY MOUTH  DAILY (  Patient taking differently: Take 40 mg by mouth daily before breakfast. ) 90 tablet 1  . albuterol (PROVENTIL HFA;VENTOLIN HFA) 108 (90 Base) MCG/ACT inhaler Inhale 2 puffs into the lungs every 4 (four) hours as needed for wheezing or shortness of breath. (Patient not taking: Reported on 12/31/2018) 1 Inhaler 0    Home: Home Living Family/patient expects to be discharged to:: Private residence Living Arrangements: Spouse/significant other Available Help at Discharge: Family, Available 24 hours/day Type of Home: House Home Access: Stairs to enter Technical brewer of Steps: 4 Entrance Stairs-Rails: Right Home Layout: Two level(chair lift) Alternate Level Stairs-Number of Steps: flight Bathroom Shower/Tub: Tub/shower unit Home Equipment: Environmental consultant - 2 wheels  Functional History: Prior Function Level of Independence: Independent Comments: used no AD in house, used RW occasionally outside of home Functional Status:  Mobility: Bed  Mobility General bed mobility comments: received sitting EOB and was dizzy Transfers Overall transfer level: Needs assistance Equipment used: Rolling walker (2 wheeled), 1 person hand held assist Transfers: Sit to/from Stand, [employer] Transfers Sit to Stand: Mod assist Stand pivot transfers: Mod assist General transfer comment: SPT to [facility] with mod A for power up as well as for support in standing due to B knee instability and extreme dizziness. Pt also stood to RW with mod A for bowel cleanup.  Ambulation/Gait General Gait Details: unable due to dizziness  ADL:  Cognition: Cognition Overall Cognitive Status: Impaired/Different from baseline Orientation Level: Oriented X4 Cognition Arousal/Alertness: Awake/alert Behavior During Therapy: WFL for tasks assessed/performed Overall Cognitive Status: Impaired/Different from baseline Area of Impairment: Memory Memory: Decreased short-term memory, Decreased recall of precautions General Comments: repeats self frequently, after being told not to stand without assist, he attempted from [facility] despite fact that he was given call bell and therapist right outside door. Pt reports that his memory and word finding abilities have been declining for awhile, unsure how much worse he is now  Blood pressure (!) 151/93, pulse 93, temperature 97.6 F (36.4 C), resp. rate 18, height _0  (1.575 m), weight 65.8 kg, SpO2 98 %. Physical Exam  Constitutional: He is oriented to person, place, and time. He appears well-developed. No distress.  HENT:  Head: Normocephalic.  Eyes: Pupils are equal, round, and reactive to light.  Neck: Normal range of motion.  Cardiovascular: Normal rate and regular rhythm. Exam reveals no friction rub.  No murmur heard. Respiratory: Effort normal and breath sounds normal. No respiratory distress. He has no wheezes. He has no rales.  GI: Soft. He exhibits no distension.  Neurological: He is alert and oriented to person,  place, and time.  Follows commands. Speech clear. Visual loss, partially in the LLQ. Tracks to all fields, no nystagmus. HOH, especilly through right ear.  Strength 4 to 4+/5 throughout. No sensory loss.     Skin: Skin is warm and dry.  Scattered bruises  Psychiatric: He has a normal mood and affect. His behavior is normal.    LabResultsLast24Hours        Results for orders placed or performed during the hospital encounter of 12/31/18 (from the past 24 hour(s))  CBG monitoring, ED     Status: Abnormal   Collection Time: 12/31/18  1:20 PM  Result Value Ref Range   Glucose-Capillary 154 (H) 70 - 99 mg/dL   Comment 1 Notify RN    Comment 2 Document in Chart   I-Stat Troponin, ED (not at [facility])     Status: None   Collection Time: 12/31/18  1:54  PM  Result Value Ref Range   Troponin i, poc 0.01 0.00 - 0.08 ng/mL   Comment 3          Urine culture     Status: None   Collection Time: 12/31/18  2:00 PM  Result Value Ref Range   Specimen Description URINE, CLEAN CATCH    Special Requests NONE    Culture      NO GROWTH Performed at [facility], [address], [place], [place] [address]    Report Status 01/01/2019 FINAL   TSH     Status: None   Collection Time: 12/31/18  2:28 PM  Result Value Ref Range   TSH 2.124 0.350 - 4.500 uIU/mL  Hemoglobin A1c     Status: Abnormal   Collection Time: 01/01/19  4:21 AM  Result Value Ref Range   Hgb A1c MFr Bld 6.2 (H) 4.8 - 5.6 %   Mean Plasma Glucose 131.24 mg/dL  Lipid panel     Status: Abnormal   Collection Time: 01/01/19  4:21 AM  Result Value Ref Range   Cholesterol 252 (H) 0 - 200 mg/dL   Triglycerides 204 (H) <150 mg/dL   HDL 34 (L) >40 mg/dL   Total CHOL/HDL Ratio 7.4 RATIO   VLDL 41 (H) 0 - 40 mg/dL   LDL Cholesterol 177 (H) 0 - 99 mg/dL  Magnesium     Status: None   Collection Time: 01/01/19  4:21 AM  Result Value Ref Range   Magnesium 2.0 1.7 - 2.4 mg/dL  Phosphorus      Status: None   Collection Time: 01/01/19  4:21 AM  Result Value Ref Range   Phosphorus 3.9 2.5 - 4.6 mg/dL  Comprehensive metabolic panel     Status: Abnormal   Collection Time: 01/01/19  4:21 AM  Result Value Ref Range   Sodium 143 135 - 145 mmol/L   Potassium 4.0 3.5 - 5.1 mmol/L   Chloride 109 98 - 111 mmol/L   CO2 25 22 - 32 mmol/L   Glucose, Bld 122 (H) 70 - 99 mg/dL   BUN 16 8 - 23 mg/dL   Creatinine, Ser 1.58 (H) 0.61 - 1.24 mg/dL   Calcium 9.3 8.9 - 10.3 mg/dL   Total Protein 5.5 (L) 6.5 - 8.1 g/dL   Albumin 3.0 (L) 3.5 - 5.0 g/dL   AST 16 15 - 41 U/L   ALT 13 0 - 44 U/L   Alkaline Phosphatase 45 38 - 126 U/L   Total Bilirubin 0.8 0.3 - 1.2 mg/dL   GFR calc non Af Amer 36 (L) >60 mL/min   GFR calc Af Amer 42 (L) >60 mL/min   Anion gap 9 5 - 15  CBC     Status: Abnormal   Collection Time: 01/01/19  4:21 AM  Result Value Ref Range   WBC 6.1 4.0 - 10.5 K/uL   RBC 4.15 (L) 4.22 - 5.81 MIL/uL   Hemoglobin 13.3 13.0 - 17.0 g/dL   HCT 40.3 39.0 - 52.0 %   MCV 97.1 80.0 - 100.0 fL   MCH 32.0 26.0 - 34.0 pg   MCHC 33.0 30.0 - 36.0 g/dL   RDW 13.2 11.5 - 15.5 %   Platelets 263 150 - 400 K/uL   nRBC 0.0 0.0 - 0.2 %      ImagingResults(Last48hours)  Mr [name] Wo Contrast  Result Date: 12/31/2018 CLINICAL DATA:  Initial evaluation for acute intermittent dizziness for 3 days. EXAM: MRI  HEAD WITHOUT CONTRAST MRA HEAD WITHOUT CONTRAST MRA NECK WITHOUT CONTRAST TECHNIQUE: Multiplanar, multiecho pulse sequences of the brain and surrounding structures were obtained without intravenous contrast. Angiographic images of the Circle of Willis were obtained using MRA technique without intravenous contrast. Angiographic images of the neck were obtained using MRA technique without intravenous contrast. Carotid stenosis measurements (when applicable) are obtained utilizing NASCET criteria, using the distal internal carotid diameter as the denominator.  COMPARISON:  None. FINDINGS: MRI HEAD FINDINGS Brain: Advanced age-related cerebral atrophy. Minimal T2/FLAIR hyperintensity within the periventricular white matter, most likely related to chronic microvascular ischemic disease, felt to be within normal limits for age. 6 mm focus of diffusion abnormality at the left occipital lobe compatible with a small acute ischemic infarct (series 3, image 18). No associated hemorrhage or mass effect. No other evidence for acute or subacute ischemia. Gray-white matter differentiation otherwise maintained. No encephalomalacia to suggest chronic cortical infarction. No foci of susceptibility artifact to suggest acute or chronic intracranial hemorrhage. No mass lesion, midline shift or mass effect. Diffuse ventricular prominence related to global parenchymal volume loss without hydrocephalus. No extra-axial fluid collection. Pituitary gland normal. Vascular: Major intracranial vascular flow voids maintained. Skull and upper cervical spine: Prominent degenerative thickening at the tectorial membrane with resultant severe stenosis at the craniocervical junction and flattening of the right greater than left upper cervical spinal cord (series 13, image 6). No visible cord signal changes on this exam. Remainder the visualized cervical spine demonstrates no acute finding. Bone marrow signal intensity within normal limits. No scalp soft tissue abnormality. Sinuses/Orbits: Patient status post bilateral ocular lens replacement. Mild scattered mucoperiosteal thickening throughout the paranasal sinuses without evidence for acute sinusitis. No mastoid effusion. Inner ear structures normal. Other: None. MRA HEAD FINDINGS ANTERIOR CIRCULATION: Distal cervical segments of the internal carotid arteries are patent with symmetric antegrade flow. Petrous segments widely patent bilaterally. Cavernous segments widely patent bilaterally. Moderate narrowing at the supraclinoid segments bilaterally, right  slightly greater than left. ICA termini perfused. A1 segment patent. Right A1 not visualized, which could be either hypoplastic and/or absent. Grossly normal anterior communicating artery. Anterior cerebral arteries mildly irregular but patent to their distal aspects without significant stenosis. Mild atheromatous irregularity within the right M1 segment without high-grade stenosis. Normal right MCA bifurcation. Distal right MCA branches well perfused. On the left, there is a severe near occlusive stenosis involving the proximal-mid left M1 segment, measuring approximately 7 mm in length (series 404, image 11). Left M1 narrowed but patent distally. Severe stenosis at the proximal of the left M2 inferior division. Left MCA branches otherwise perfused distally. POSTERIOR CIRCULATION: Left vertebral artery mildly irregular but patent to the vertebrobasilar junction without high-grade stenosis. Patent left PICA. Right vertebral artery occluded at the skull base. Some retrograde filling into the distal right V4 segment across the vertebrobasilar junction. Right PICA not visualized. Moderate to severe segmental stenoses involving the proximal and distal basilar artery (series 406, image 11). Superior cerebral arteries grossly patent bilaterally. Both of the PCAs primarily supplied via the basilar. Additional multifocal severe segmental stenoses involving the proximal and mid P2 segments seen bilaterally. PCAs are perfused to their distal aspects. MRA NECK FINDINGS Examination technically limited by lack of IV contrast. Partially visualized aortic arch within normal limits for caliber with normal 3 vessel morphology. No appreciable flow-limiting stenosis about the origin of the great vessels. Partially visualized subclavian arteries patent bilaterally. Right CCA patent from its origin to the bifurcation without obvious flow-limiting stenosis. Atheromatous plaque  about the right bifurcation with associated stenosis of  approximately 50-60% by NASCET criteria. Right ICA otherwise patent distally. Left CCA tortuous proximally but grossly patent to the bifurcation without appreciable flow-limiting stenosis. No significant atheromatous narrowing about the left bifurcation. Left ICA mildly tortuous but otherwise patent. Both of the vertebral arteries arise from the subclavian arteries. Left vertebral artery dominant and widely patent within the neck. Right vertebral artery diminutive with scant irregular flow within the right V2 segment. Right vertebral otherwise occluded within the neck. IMPRESSION: MRI HEAD IMPRESSION: 1. 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 2. Pronounced degenerative thickening at the tectorial membrane with resultant severe spinal stenosis at the cranial cervical junction and impingement upon the upper cervical spinal cord. No definite cord signal changes evident on this exam. 3. Underlying age-related cerebral atrophy. No other acute intracranial abnormality identified. MRA HEAD IMPRESSION: 1. 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 Dominant left vertebral artery widely patent to the vertebrobasilar junction. 2. 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 3. Severe near occlusive stenosis involving the proximal-mid left M1 segment, with additional downstream proximal left M2 stenosis. 4. Moderate diffuse atheromatous narrowing involving the supraclinoid ICAs bilaterally. MRA NECK IMPRESSION: 1. 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 Dominant left vertebral artery widely patent with antegrade flow. 2. 50-60% atheromatous stenosis at the right carotid bifurcation/origin of the right ICA. Right carotid artery system otherwise patent. 3. No hemodynamically significant stenosis within the left carotid artery system within the neck.  Electronically Signed   By: Jeannine Boga M.D.   On: 12/31/2018 18:39   Mr Jodene Nam Neck Wo Contrast  Result Date: 12/31/2018 CLINICAL DATA:  Initial evaluation for acute intermittent dizziness for 3 days. EXAM: MRI HEAD WITHOUT CONTRAST MRA HEAD WITHOUT CONTRAST MRA NECK WITHOUT CONTRAST TECHNIQUE: Multiplanar, multiecho pulse sequences of the brain and surrounding structures were obtained without intravenous contrast. Angiographic images of the Circle of Willis were obtained using MRA technique without intravenous contrast. Angiographic images of the neck were obtained using MRA technique without intravenous contrast. Carotid stenosis measurements (when applicable) are obtained utilizing NASCET criteria, using the distal internal carotid diameter as the denominator. COMPARISON:  None. FINDINGS: MRI HEAD FINDINGS Brain: Advanced age-related cerebral atrophy. Minimal T2/FLAIR hyperintensity within the periventricular white matter, most likely related to chronic microvascular ischemic disease, felt to be within normal limits for age. 6 mm focus of diffusion abnormality at the left occipital lobe compatible with a small acute ischemic infarct (series 3, image 18). No associated hemorrhage or mass effect. No other evidence for acute or subacute ischemia. Gray-white matter differentiation otherwise maintained. No encephalomalacia to suggest chronic cortical infarction. No foci of susceptibility artifact to suggest acute or chronic intracranial hemorrhage. No mass lesion, midline shift or mass effect. Diffuse ventricular prominence related to global parenchymal volume loss without hydrocephalus. No extra-axial fluid collection. Pituitary gland normal. Vascular: Major intracranial vascular flow voids maintained. Skull and upper cervical spine: Prominent degenerative thickening at the tectorial membrane with resultant severe stenosis at the craniocervical junction and flattening of the right greater than left upper  cervical spinal cord (series 13, image 6). No visible cord signal changes on this exam. Remainder the visualized cervical spine demonstrates no acute finding. Bone marrow signal intensity within normal limits. No scalp soft tissue abnormality. Sinuses/Orbits: Patient status post bilateral ocular lens replacement. Mild scattered mucoperiosteal thickening throughout the paranasal  sinuses without evidence for acute sinusitis. No mastoid effusion. Inner ear structures normal. Other: None. MRA HEAD FINDINGS ANTERIOR CIRCULATION: Distal cervical segments of the internal carotid arteries are patent with symmetric antegrade flow. Petrous segments widely patent bilaterally. Cavernous segments widely patent bilaterally. Moderate narrowing at the supraclinoid segments bilaterally, right slightly greater than left. ICA termini perfused. A1 segment patent. Right A1 not visualized, which could be either hypoplastic and/or absent. Grossly normal anterior communicating artery. Anterior cerebral arteries mildly irregular but patent to their distal aspects without significant stenosis. Mild atheromatous irregularity within the right M1 segment without high-grade stenosis. Normal right MCA bifurcation. Distal right MCA branches well perfused. On the left, there is a severe near occlusive stenosis involving the proximal-mid left M1 segment, measuring approximately 7 mm in length (series 404, image 11). Left M1 narrowed but patent distally. Severe stenosis at the proximal of the left M2 inferior division. Left MCA branches otherwise perfused distally. POSTERIOR CIRCULATION: Left vertebral artery mildly irregular but patent to the vertebrobasilar junction without high-grade stenosis. Patent left PICA. Right vertebral artery occluded at the skull base. Some retrograde filling into the distal right V4 segment across the vertebrobasilar junction. Right PICA not visualized. Moderate to severe segmental stenoses involving the proximal and  distal basilar artery (series 406, image 11). Superior cerebral arteries grossly patent bilaterally. Both of the PCAs primarily supplied via the basilar. Additional multifocal severe segmental stenoses involving the proximal and mid P2 segments seen bilaterally. PCAs are perfused to their distal aspects. MRA NECK FINDINGS Examination technically limited by lack of IV contrast. Partially visualized aortic arch within normal limits for caliber with normal 3 vessel morphology. No appreciable flow-limiting stenosis about the origin of the great vessels. Partially visualized subclavian arteries patent bilaterally. Right CCA patent from its origin to the bifurcation without obvious flow-limiting stenosis. Atheromatous plaque about the right bifurcation with associated stenosis of approximately 50-60% by NASCET criteria. Right ICA otherwise patent distally. Left CCA tortuous proximally but grossly patent to the bifurcation without appreciable flow-limiting stenosis. No significant atheromatous narrowing about the left bifurcation. Left ICA mildly tortuous but otherwise patent. Both of the vertebral arteries arise from the subclavian arteries. Left vertebral artery dominant and widely patent within the neck. Right vertebral artery diminutive with scant irregular flow within the right V2 segment. Right vertebral otherwise occluded within the neck. IMPRESSION: MRI HEAD IMPRESSION: 1. 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 2. Pronounced degenerative thickening at the tectorial membrane with resultant severe spinal stenosis at the cranial cervical junction and impingement upon the upper cervical spinal cord. No definite cord signal changes evident on this exam. 3. Underlying age-related cerebral atrophy. No other acute intracranial abnormality identified. MRA HEAD IMPRESSION: 1. 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 Dominant left  vertebral artery widely patent to the vertebrobasilar junction. 2. 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 3. Severe near occlusive stenosis involving the proximal-mid left M1 segment, with additional downstream proximal left M2 stenosis. 4. Moderate diffuse atheromatous narrowing involving the supraclinoid ICAs bilaterally. MRA NECK IMPRESSION: 1. 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 Dominant left vertebral artery widely patent with antegrade flow. 2. 50-60% atheromatous stenosis at the right carotid bifurcation/origin of the right ICA. Right carotid artery system otherwise patent. 3. No hemodynamically significant stenosis within the left carotid artery system within the neck. Electronically Signed   By: Jeannine Boga  M.D.   On: 12/31/2018 18:39   Mr Brain Wo Contrast  Result Date: 12/31/2018 CLINICAL DATA:  Initial evaluation for acute intermittent dizziness for 3 days. EXAM: MRI HEAD WITHOUT CONTRAST MRA HEAD WITHOUT CONTRAST MRA NECK WITHOUT CONTRAST TECHNIQUE: Multiplanar, multiecho pulse sequences of the brain and surrounding structures were obtained without intravenous contrast. Angiographic images of the Circle of Willis were obtained using MRA technique without intravenous contrast. Angiographic images of the neck were obtained using MRA technique without intravenous contrast. Carotid stenosis measurements (when applicable) are obtained utilizing NASCET criteria, using the distal internal carotid diameter as the denominator. COMPARISON:  None. FINDINGS: MRI HEAD FINDINGS Brain: Advanced age-related cerebral atrophy. Minimal T2/FLAIR hyperintensity within the periventricular white matter, most likely related to chronic microvascular ischemic disease, felt to be within normal limits for age. 6 mm focus of diffusion abnormality at the left occipital lobe compatible with a small acute ischemic infarct  (series 3, image 18). No associated hemorrhage or mass effect. No other evidence for acute or subacute ischemia. Gray-white matter differentiation otherwise maintained. No encephalomalacia to suggest chronic cortical infarction. No foci of susceptibility artifact to suggest acute or chronic intracranial hemorrhage. No mass lesion, midline shift or mass effect. Diffuse ventricular prominence related to global parenchymal volume loss without hydrocephalus. No extra-axial fluid collection. Pituitary gland normal. Vascular: Major intracranial vascular flow voids maintained. Skull and upper cervical spine: Prominent degenerative thickening at the tectorial membrane with resultant severe stenosis at the craniocervical junction and flattening of the right greater than left upper cervical spinal cord (series 13, image 6). No visible cord signal changes on this exam. Remainder the visualized cervical spine demonstrates no acute finding. Bone marrow signal intensity within normal limits. No scalp soft tissue abnormality. Sinuses/Orbits: Patient status post bilateral ocular lens replacement. Mild scattered mucoperiosteal thickening throughout the paranasal sinuses without evidence for acute sinusitis. No mastoid effusion. Inner ear structures normal. Other: None. MRA HEAD FINDINGS ANTERIOR CIRCULATION: Distal cervical segments of the internal carotid arteries are patent with symmetric antegrade flow. Petrous segments widely patent bilaterally. Cavernous segments widely patent bilaterally. Moderate narrowing at the supraclinoid segments bilaterally, right slightly greater than left. ICA termini perfused. A1 segment patent. Right A1 not visualized, which could be either hypoplastic and/or absent. Grossly normal anterior communicating artery. Anterior cerebral arteries mildly irregular but patent to their distal aspects without significant stenosis. Mild atheromatous irregularity within the right M1 segment without high-grade  stenosis. Normal right MCA bifurcation. Distal right MCA branches well perfused. On the left, there is a severe near occlusive stenosis involving the proximal-mid left M1 segment, measuring approximately 7 mm in length (series 404, image 11). Left M1 narrowed but patent distally. Severe stenosis at the proximal of the left M2 inferior division. Left MCA branches otherwise perfused distally. POSTERIOR CIRCULATION: Left vertebral artery mildly irregular but patent to the vertebrobasilar junction without high-grade stenosis. Patent left PICA. Right vertebral artery occluded at the skull base. Some retrograde filling into the distal right V4 segment across the vertebrobasilar junction. Right PICA not visualized. Moderate to severe segmental stenoses involving the proximal and distal basilar artery (series 406, image 11). Superior cerebral arteries grossly patent bilaterally. Both of the PCAs primarily supplied via the basilar. Additional multifocal severe segmental stenoses involving the proximal and mid P2 segments seen bilaterally. PCAs are perfused to their distal aspects. MRA NECK FINDINGS Examination technically limited by lack of IV contrast. Partially visualized aortic arch within normal limits for caliber with normal 3 vessel morphology. No appreciable  flow-limiting stenosis about the origin of the great vessels. Partially visualized subclavian arteries patent bilaterally. Right CCA patent from its origin to the bifurcation without obvious flow-limiting stenosis. Atheromatous plaque about the right bifurcation with associated stenosis of approximately 50-60% by NASCET criteria. Right ICA otherwise patent distally. Left CCA tortuous proximally but grossly patent to the bifurcation without appreciable flow-limiting stenosis. No significant atheromatous narrowing about the left bifurcation. Left ICA mildly tortuous but otherwise patent. Both of the vertebral arteries arise from the subclavian arteries. Left  vertebral artery dominant and widely patent within the neck. Right vertebral artery diminutive with scant irregular flow within the right V2 segment. Right vertebral otherwise occluded within the neck. IMPRESSION: MRI HEAD IMPRESSION: 1. 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 2. Pronounced degenerative thickening at the tectorial membrane with resultant severe spinal stenosis at the cranial cervical junction and impingement upon the upper cervical spinal cord. No definite cord signal changes evident on this exam. 3. Underlying age-related cerebral atrophy. No other acute intracranial abnormality identified. MRA HEAD IMPRESSION: 1. 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 Dominant left vertebral artery widely patent to the vertebrobasilar junction. 2. 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 3. Severe near occlusive stenosis involving the proximal-mid left M1 segment, with additional downstream proximal left M2 stenosis. 4. Moderate diffuse atheromatous narrowing involving the supraclinoid ICAs bilaterally. MRA NECK IMPRESSION: 1. 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 Dominant left vertebral artery widely patent with antegrade flow. 2. 50-60% atheromatous stenosis at the right carotid bifurcation/origin of the right ICA. Right carotid artery system otherwise patent. 3. No hemodynamically significant stenosis within the left carotid artery system within the neck. Electronically Signed   By: Jeannine Boga M.D.   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 Does the need for close, 24 hr/day medical supervision in concert with the patient's rehab needs make it unreasonable for this patient to be served in a less intensive setting? Yes and  Potentially 2. Co-Morbidities requiring supervision/potential complications: cdCHF, HTN, CKD 3. Due to bladder management, bowel management, safety, skin/wound care, disease management, medication administration, pain management and patient education, does the patient require 24 hr/day rehab nursing? Yes 4. Does the patient require coordinated care of a physician, rehab nurse, PT (1-2 hrs/day, 5 days/week) and OT (1-2 hrs/day, 5 days/week) to address physical and functional deficits in the context of the above medical diagnosis(es)? Yes Addressing deficits in the following areas: balance, endurance, locomotion, strength, transferring, bowel/bladder control, bathing, dressing, feeding, grooming, toileting and psychosocial support 5. Can the patient actively participate in an intensive therapy program of at least 3 hrs of therapy per day at least 5 days per week? Yes 6. The potential for patient to make measurable gains while on inpatient rehab is excellent 7. Anticipated functional outcomes upon discharge from inpatient rehab are modified independent  with PT, modified independent with OT, n/a with SLP. 8. Estimated rehab length of stay to reach the above functional goals is: 7-11 days 9. Anticipated D/C setting: Home 10. Anticipated post D/C treatments: [place] therapy 11. Overall Rehab/Functional Prognosis: excellent  RECOMMENDATIONS: This patient's condition is appropriate for continued rehabilitative care in the following setting: CIR Patient has agreed to participate in recommended program. Yes and Potentially Note that insurance prior authorization may be required for reimbursement for recommended care.  Comment: Pt's exam was fairly benign today except for vision. Balance and coordination deficits more evident when ambulating. Rehab Admissions Coordinator to follow up.  Thanks,  Robert Staggers, MD, Mellody Drown  I have personally performed a face to face diagnostic evaluation of this  patient. Additionally, I have reviewed and concur with the physician assistant's documentation above.    Lavon Paganini Angiulli, PA-C 01/01/2019        Revision History                        Routing History

## 2019-01-04 NOTE — IPOC Note (Signed)
Overall Plan of Care [facility]) Patient Details Name: Robert Rowe MRN: [id number] DOB: 1918-06-17  Admitting Diagnosis: <principal problem not specified>  Hospital Problems: Active Problems:   Vertebrobasilar insufficiency     Functional Problem List: Nursing Edema, Endurance, Medication Management, Perception, Safety, Sensory  PT Balance, Endurance, Motor, Safety  OT Balance, Endurance, Motor  SLP    TR         Basic ADL's: OT Bathing, Dressing, Toileting     Advanced  ADL's: OT       Transfers: PT Bed Mobility, Bed to Chair, Car, Furniture, Floor  OT Toilet, Tub/Shower     Locomotion: PT Stairs, Ambulation     Additional Impairments: OT None  SLP        TR      Anticipated Outcomes Item Anticipated Outcome  Self Feeding no goal  Swallowing      Basic self-care  S  Toileting  mod I   Bathroom Transfers S  Bowel/Bladder  Pt will maintain continence with min assist while in rehab.   Transfers  supervision  Locomotion  supervision household gait w/ LRAD  Communication     Cognition     Pain  Pt will manage pain at 2 or less on a scale of 0-10.   Safety/Judgment  Pt will follow safety plan and precautions with min cues while in rehab.    Therapy Plan: PT Intensity: Minimum of 1-2 x/day ,45 to 90 minutes PT Frequency: 5 out of 7 days PT Duration Estimated Length of Stay: 5-7 days OT Intensity: Minimum of 1-2 x/day, 45 to 90 minutes OT Frequency: 5 out of 7 days OT Duration/Estimated Length of Stay: 7-8 days      Team Interventions: Nursing Interventions Patient/Family Education, Disease Management/Prevention, Medication Management, Cognitive Remediation/Compensation, Discharge Planning  PT interventions Ambulation/gait training, Cognitive remediation/compensation, Discharge planning, DME/adaptive equipment instruction, Functional mobility training, Pain management, Psychosocial support, Splinting/orthotics, Therapeutic Activities, UE/LE Strength  taining/ROM, UE/LE Coordination activities, Therapeutic Exercise, Stair training, Skin care/wound management, Patient/family education, Neuromuscular re-education, Functional electrical stimulation, Disease management/prevention, Academic librarian, Training and development officer  OT Interventions Training and development officer, Discharge planning, Self Care/advanced ADL retraining, Therapeutic Activities, Functional mobility training, Patient/family education, Therapeutic Exercise, DME/adaptive equipment instruction, Neuromuscular re-education  SLP Interventions    TR Interventions    SW/CM Interventions Discharge Planning, Psychosocial Support, Patient/Family Education   Barriers to Discharge MD  Medical stability  Nursing Medical stability    PT Home environment access/layout, Other (comments), Medical stability 6 steps to enter home, pt 85 years old w/ multiple medical comorbidities  OT      SLP      SW       Team Discharge Planning: Destination: PT-Home ,OT- Home , SLP-  Projected Follow-up: PT-Home health PT, OT-  Home health OT, SLP-  Projected Equipment Needs: PT-To be determined, OT- Tub/shower bench, SLP-  Equipment Details: PT-has RW already, OT-pt has a Musician involved in discharge planning: PT- Patient,  OT-Patient, Family Midwife, SLP-   MD ELOS: 7-10d Medical Rehab Prognosis:  Excellent Assessment:  85 year old right-handed male with history of TIA, PVCs, hypothyroidism, chronic diastolic congestive heart failure, hypertension,CKD. Presented to 12/31/2018 with vertigo as well as limited hearing to the right ear 2-3 days. Patient had made plans to see ENT. There were reports of a fall in the bathroom with no loss of consciousness. Upon arrival patient blood pressure 201/97. Patient has been followed by Dr. Delice Lesch neurology services for the past 6 months 4 episodes  of syncope/BPPV as well as bilateral leg weakness and slurred speech. MRA of the  head and neck showed intracranial stenosis with significant intracranial stenosis in basilar, right ICA cavernous and supraclinoid junction, moderate narrowing bilateral ACA and right ACA, distal right vertebral and bilateral PCA stenosis. Recurrent symptoms concerning for hypoperfusion causing TIA. Patient was maintained on Plavix. 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 He lives with spouse reportedly independent prior to admission he did use a rolling walker outside the home. MRI of the brain showed a 6 mm acute ischemic nonhemorrhagic infarction of the left occipital lobe. Echocardiogram with ejection fraction of 65% no evidence of left ventricular regional wall motion abnormalities. EEG was negative for seizure. Patient is currently maintained on aspirin 325 mg and Plavix for CVA prophylaxis for 3 months and then either aspirin  or Plavix alone. Tolerating a regular diet. Therapy evaluations completed with recommendations of physical medicine rehabilitation consult. Patient was admitted for a comprehensive rehabilitation program.   Now requiring 24/7 Rehab RN,MD, as well as CIR level PT, OT and SLP.  Treatment team will focus on ADLs and mobility with goals set at [facility] I/Sup See Team Conference Notes for weekly updates to the plan of care

## 2019-01-04 NOTE — Progress Notes (Signed)
[place] PHYSICAL MEDICINE & REHABILITATION PROGRESS NOTE   Subjective/Complaints:  No issues overnite, eating ok  ROS - neg CP, SOB, N/V/D  Objective:   No results found. Recent Labs    01/04/19 0516  WBC 5.9  HGB 13.5  HCT 42.5  PLT 225   Recent Labs    01/03/19 0514 01/04/19 0516  NA 142 138  K 4.0 3.8  CL 106 105  CO2 25 24  GLUCOSE 117* 128*  BUN 16 21  CREATININE 1.61* 1.63*  CALCIUM 8.8* 9.0    Intake/Output Summary (Last 24 hours) at 01/04/2019 0757 Last data filed at 01/04/2019 0525 Gross per 24 hour  Intake 120 ml  Output 751 ml  Net -631 ml     Physical Exam: Vital Signs Blood pressure 108/60, pulse 91, temperature 97.6 F (36.4 C), temperature source Oral, resp. rate 15, height 5\' 2"  (1.575 m), weight 64.5 kg, SpO2 94 %.   General: No acute distress Mood and affect are appropriate Heart: Regular rate and rhythm no rubs murmurs or extra sounds Lungs: Clear to auscultation, breathing unlabored, no rales or wheezes Abdomen: Positive bowel sounds, soft nontender to palpation, nondistended Extremities: No clubbing, cyanosis, or edema Skin: No evidence of breakdown, no evidence of rash Neurologic: Cranial nerves II through XII intact, motor strength is 5/5 in bilateral deltoid, bicep, tricep, grip, hip flexor, knee extensors, ankle dorsiflexor and plantar flexor No visual field deficits Musculoskeletal: Full range of motion in all 4 extremities. No joint swelling   Assessment/Plan: 1. Functional deficits secondary to Left occipital infarct  which require 3+ hours per day of interdisciplinary therapy in a comprehensive inpatient rehab setting.  Physiatrist is providing close team supervision and 24 hour management of active medical problems listed below.  Physiatrist and rehab team continue to assess barriers to discharge/monitor patient progress toward functional and medical goals  Care Tool:  Bathing              Bathing assist       Upper Body Dressing/Undressing Upper body dressing        Upper body assist      Lower Body Dressing/Undressing Lower body dressing            Lower body assist       Toileting Toileting    Toileting assist Assist for toileting: Supervision/Verbal cueing     Transfers Chair/bed transfer  Transfers assist           Locomotion Ambulation   Ambulation assist              Walk 10 feet activity   Assist           Walk 50 feet activity   Assist           Walk 150 feet activity   Assist           Walk 10 feet on uneven surface  activity   Assist           Wheelchair     Assist               Wheelchair 50 feet with 2 turns activity    Assist            Wheelchair 150 feet activity     Assist           Medical Problem List and Plan: 1.  Gait instability with syncope secondary to right occipital infarction, likely vertebrobasilar insufficiency  CIR PT,  OT evals 2.  DVT Prophylaxis/Anticoagulation: SCDs. Monitor for any signs of DVT- no calf pain or swelling  3. Pain Management:  Tylenol as needed 4. Mood:  Provide emotional support 5. Neuropsych: This patient is capable of making decisions on his own behalf. 6. Skin/Wound Care: routine skin checks 7. Fluids/Electrolytes/Nutrition:  Routine in and out's with follow-up chemistries, monitor meal intake 8.  Chronic diastolic congestive heart failure. Monitor for any signs of fluid overload. Patient on Lasix 20-40 mg as needed prior to admission. Resume as needed No pedal edema 9. CKD. Creatinine baseline 1.56-1.70 10. Hypertension. Toprol-XL 25 mg daily, monitor vitals q shift Vitals:   01/03/19 1938 01/04/19 0528  BP: (!) 159/84 108/60  Pulse: 83 91  Resp: 15 15  Temp: 97.7 F (36.5 C) 97.6 F (36.4 C)  SpO2: 96% 94%  fluctuating BP con tot monitor prior to med changes 11. Hypothyroidism.  TSH 2.124. Continue Synthroid 12. Hyperlipidemia.  Lipitor    LOS: 1 days A FACE TO FACE EVALUATION WAS PERFORMED  Charlett Blake 01/04/2019, 7:57 AM

## 2019-01-04 NOTE — Evaluation (Signed)
Occupational Therapy Assessment and Plan  Patient Details  Name: Robert Rowe MRN: [id number] Date of Birth: 1918-01-20  OT Diagnosis: hemiplegia affecting non-dominant side and muscle weakness (generalized) Rehab Potential: Rehab Potential (ACUTE ONLY): Excellent ELOS: 7-8 days   Today's Date: 01/04/2019 OT Individual Time: 7680-8811 OT Individual Time Calculation (min): 65 min     Problem List:  Patient Active Problem List   Diagnosis Date Noted  . 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 Gait disturbance, post-stroke   . BPV (benign positional vertigo) 01/02/2019  . Acute CVA (cerebrovascular accident) ([place]) 01/01/2019  . TIA (transient ischemic attack) 12/31/2018  . CVA (cerebral vascular accident) ([place]) 12/31/2018  . Chest pain 05/15/2018  . CKD (chronic kidney disease) stage 3, GFR 30-59 ml/min (HCC) 05/08/2018  . Other fatigue 06/13/2017  . Abnormal diffusion capacity determined by pulmonary function test 06/13/2017  . Chronic diastolic CHF (congestive heart failure) ([place]) 11/01/2016  . Essential hypertension 11/01/2016  . Intracranial vascular stenosis 11/09/2015  . Mild cognitive impairment 11/09/2015  . 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 Benign prostatic hyperplasia with urinary obstruction 03/19/2015  . Awareness alteration, transient 02/26/2015  . PVC's (premature ventricular contractions) 08/22/2013  . Hypothyroidism 05/09/2013  . Stricture and stenosis of esophagus 03/12/2013  . Dysphagia, unspecified(787.20) 02/22/2013  . DOE (dyspnea on exertion) 03/23/2011  . PALPITATIONS 02/12/2010  . CHEST DISCOMFORT 04/25/2009  . PEDAL EDEMA 05/01/2008  . GERD 12/03/2007  . Dyslipidemia 04/05/2007  . BENIGN PROSTATIC HYPERTROPHY 04/05/2007  . Rheumatoid arthritis ([place]) 04/05/2007  . Osteoarthritis 04/05/2007  . History of cardiovascular disorder 04/05/2007    Past Medical History:  Past Medical History:  Diagnosis Date  . BENIGN PROSTATIC  HYPERTROPHY 04/05/2007  . 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 CHEST DISCOMFORT 04/25/2009  . Chronic diastolic CHF (congestive heart failure) ([place])    a. Echo 12/16: mild LVH, EF 55-60%, no RWMA, Gr 1 DD, mild AI, MAC, mild LAE, normal RVF, PASP 37 mmHg  . Chronic kidney disease    chronic   . DJD (degenerative joint disease)   . Dysrhythmia    pvc's   . GERD 12/03/2007  . Hiatal hernia   . History of nuclear stress test    Myoview 1/17: EF 53%, normal perfusion; Low Risk  . HYPERLIPIDEMIA 04/05/2007  . Hypothyroidism   . Osteoarth NOS-Unspec 04/05/2007  . Palpitations 02/12/2010  . PEDAL EDEMA 05/01/2008  . Rheumatoid arthritis(714.0) 04/05/2007  . Shortness of breath dyspnea   . TRANSIENT ISCHEMIC ATTACK, HX OF 04/05/2007   ?    Past Surgical History:  Past Surgical History:  Procedure Laterality Date  . BALLOON DILATION N/A 03/12/2013   Procedure: BALLOON DILATION;  Surgeon: Inda Castle, MD;  Location: [facility];  Service: Endoscopy;  Laterality: N/A;  . CARPAL TUNNEL RELEASE  11/28/2018   Left hand  . CATARACT EXTRACTION  2001   bilateral  . CYSTOSCOPY WITH URETHRAL DILATATION N/A 09/24/2015   Procedure: CYSTOSCOPY WITH COOK BALLOON DILATATION OF URETHRAL STRICTURE ;  Surgeon: Carolan Clines, MD;  Location: [facility];  Service: Urology;  Laterality: N/A;  . CYSTOSCOPY WITH URETHRAL DILATATION N/A 07/20/2016   Procedure: CYSTOSCOPY WITH URETHRAL DILATATION;  Surgeon: Alexis Frock, MD;  Location: [facility];  Service: Urology;  Laterality: N/A;  1 HOUR  . ESOPHAGOGASTRODUODENOSCOPY N/A 03/12/2013   Procedure: ESOPHAGOGASTRODUODENOSCOPY (EGD);  Surgeon: Inda Castle, MD;  Location: [facility];  Service: Endoscopy;  Laterality: N/A;  . HEMORRHOID SURGERY    .  HERNIA REPAIR     inguinal x5  . KNEE SURGERY  arthroscopic   left  . PROSTATE CRYOABLATION     2000  . SHOULDER SURGERY  few years ago   left  . 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 Presented to 12/31/2018 with vertigo as well as limited hearing to the right ear 2-3 days. Patient had made plans to see ENT. There were reports of a fall in the bathroom with no loss of consciousness. Upon arrival patient blood pressure 201/97. Patient has been followed by Dr. Delice Lesch neurology services for the past 6 months 4 episodes of syncope/BPPV as well as bilateral leg weakness and slurred speech. MRA of the head and neck showed intracranial stenosis with significant intracranial stenosis in basilar, right ICA cavernous and supraclinoid junction, moderate narrowing bilateral ACA and right ACA, distal right vertebral and bilateral PCA stenosis. Recurrent symptoms concerning for hypoperfusion causing TIA. Patient was maintained on Plavix. 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 He lives with spouse reportedly independent prior to admission he did use a rolling walker outside the home. MRI of the brain showed a 6 mm acute ischemic nonhemorrhagic infarction of the left occipital lobe. Echocardiogram with ejection fraction of 65% no evidence of left ventricular regional wall motion abnormalities. EEG was negative for seizure. Patient is currently maintained on aspirin 325 mg and Plavix for CVA prophylaxis for 3 months and then either aspirin  or Plavix alone. Tolerating a regular diet. Therapy evaluations completed with recommendations of physical medicine rehabilitation consult. Patient was admitted for a comprehensive rehabilitation program.    Patient transferred to CIR on 01/03/2019 .    Patient currently requires  min with basic self-care skills secondary to muscle weakness, decreased coordination of LEs and decreased standing balance and decreased balance strategies.  Prior to hospitalization, patient was independent in ADLs but used a RW out in the community on occasion.   Patient will benefit from skilled intervention to increase independence with basic self-care skills prior to discharge home with care partner.  Anticipate patient will require intermittent supervision and follow up home health.  OT - End of Session Endurance Deficit: Yes Endurance Deficit Description: decreased, increased work of breathing w/ all mobility OT Assessment Rehab Potential (ACUTE ONLY): Excellent OT Patient demonstrates impairments in the following area(s): Balance;Endurance;Motor OT Basic ADL's Functional Problem(s): Bathing;Dressing;Toileting OT Transfers Functional Problem(s): Toilet;Tub/Shower OT Additional Impairment(s): None OT Plan OT Intensity: Minimum of 1-2 x/day, 45 to 90 minutes OT Frequency: 5 out of 7 days OT Duration/Estimated Length of Stay: 7-8 days OT Treatment/Interventions: Balance/vestibular training;Discharge planning;Self Care/advanced ADL retraining;Therapeutic Activities;Functional mobility training;Patient/family education;Therapeutic Exercise;DME/adaptive equipment instruction;Neuromuscular re-education OT Self Feeding Anticipated Outcome(s): no goal OT Basic Self-Care Anticipated Outcome(s): S OT Toileting Anticipated Outcome(s): mod I OT Bathroom Transfers Anticipated Outcome(s): S OT Recommendation Patient destination: Home Follow Up Recommendations: Home health OT Equipment Recommended: Tub/shower bench Equipment Details: pt has a garden tub   Skilled Therapeutic Intervention Pt seen for initial evaluation and ADL training with a focus on standing balance and endurance.   Explained role of OT and pt discussed his goals. His wife will be home with him and  she said she will always be  there to walk him to the bathroom and in the house.  She has been supervising him getting in and out of the tub at home.  Pt agreeable to a shower.  Overall he only needed CGA with mobility, transfers to toilet and shower, doffing and donning clothing, and showering.  When dofffing shoes and donning socks, pt tends to hold his breath and bend over at the waist. He does better crossing his legs and cued to breathe out.   Pt stated he has a long hx of STM difficulty and now feels the stroke impacted his balance.    Discussed goals and ELOS.  Pt in w/c with chair belt alarm on along with his wife and Rabbi.    OT Evaluation Precautions/Restrictions  Precautions Precautions: Fall Restrictions Weight Bearing Restrictions: No  Pain Pain Assessment Pain Scale: 0-10 Pain Score: 0-No pain Home Living/Prior Functioning Home Living Family/patient expects to be discharged to:: Private residence Living Arrangements: Spouse/significant other Available Help at Discharge: Family, Available 24 hours/day Type of Home: House Home Access: Stairs to enter Technical brewer of Steps: 6 Entrance Stairs-Rails: Right Home Layout: Two level, Bed/bath upstairs, 1/2 bath on main level(uses chair lift to access the 2nd floor) Alternate Level Stairs-Number of Steps: flight w/ chair lift Bathroom Shower/Tub: Optometrist: Yes  Lives With: Spouse(wife is younger) Prior Function Level of Independence: Independent with basic ADLs, Independent with gait, Independent with homemaking with ambulation, Independent with transfers(occasional RW use in community)  Able to Take Stairs?: Yes Driving: No Vocation: Retired Biomedical scientist: Retired MD Comments: enjoys going out to eat w/ friends occasionally, wife drives him to/from ADL  CGA overall for balance, A to don TED hose Vision Baseline Vision/History: Wears glasses(Reports no vision in L eye from  accident 60 years ago, has not changed since stroke) Wears Glasses: Reading only Patient Visual Report: No change from baseline Vision Assessment?: No apparent visual deficits Perception  Perception: Within Functional Limits Praxis Praxis: Intact Cognition Overall Cognitive Status: History of cognitive impairments - at baseline Arousal/Alertness: Awake/alert Orientation Level: Person;Place;Situation Person: Oriented Place: Oriented Situation: Oriented Year: 2020 Month: February Day of Week: Correct Memory: Impaired Memory Impairment: Decreased short term memory;Decreased recall of new information Immediate Memory Recall: Sock;Blue;Bed Memory Recall: Sock;Blue;Bed Memory Recall Sock: With Cue Memory Recall Blue: Without Cue Memory Recall Bed: Without Cue Focused Attention: Appears intact Awareness: Appears intact Problem Solving: Appears intact Reasoning: Appears intact Safety/Judgment: Appears intact Sensation Sensation Light Touch: Appears Intact Coordination Gross Motor Movements are Fluid and Coordinated: Yes Motor  Motor Motor: Within Functional Limits Motor - Skilled Clinical Observations: generalized weakness Mobility  Bed Mobility Bed Mobility: Rolling Right;Rolling Left;Supine to Sit;Sit to Supine Rolling Right: Supervision/verbal cueing Rolling Left: Supervision/Verbal cueing Supine to Sit: Supervision/Verbal cueing Sit to Supine: Supervision/Verbal cueing Transfers Sit to Stand: Contact Guard/Touching assist Stand to Sit: Contact Guard/Touching assist  Trunk/Postural Assessment  Cervical Assessment Cervical Assessment: Exceptions to WFL(forward head posture) Thoracic Assessment Thoracic Assessment: Exceptions to WFL(rounded shoulders) Lumbar Assessment Lumbar Assessment: Exceptions to WFL(posterior pelvic tilt, flexed at trunk ) Postural Control Postural Control: Deficits on evaluation(delayed)  Balance Balance Balance Assessed: Yes Static  Sitting Balance Static Sitting - Balance Support: Feet supported;No upper extremity supported Static Sitting - Level of Assistance: 5: Stand by assistance Dynamic Sitting Balance Dynamic Sitting - Balance Support: No upper extremity supported;Feet supported Dynamic Sitting - Level of Assistance: 5: Stand by assistance Static Standing  Balance Static Standing - Balance Support: No upper extremity supported;During functional activity Static Standing - Level of Assistance: 4: Min assist(CGA) Dynamic Standing Balance Dynamic Standing - Balance Support: No upper extremity supported;During functional activity Dynamic Standing - Level of Assistance: 4: Min assist Extremity/Trunk Assessment RUE Assessment RUE Assessment: Within Functional Limits LUE Assessment LUE Assessment: Within Functional Limits     Refer to Care Plan for Long Term Goals  Recommendations for other services: None    Discharge Criteria: Patient will be discharged from OT if patient refuses treatment 3 consecutive times without medical reason, if treatment goals not met, if there is a change in medical status, if patient makes no progress towards goals or if patient is discharged from hospital.  The above assessment, treatment plan, treatment alternatives and goals were discussed and mutually agreed upon: by patient and by family  [place] 01/04/2019, 12:31 PM

## 2019-01-04 NOTE — Care Management Note (Signed)
[facility] Individual Statement of Services  Patient Name:  Robert Rowe  Date:  01/04/2019  Welcome to the [facility].  Our goal is to provide you with an individualized program based on your diagnosis and situation, designed to meet your specific needs.  With this comprehensive rehabilitation program, you will be expected to participate in at least 3 hours of rehabilitation therapies Monday-Friday, with modified therapy programming on the weekends.  Your rehabilitation program will include the following services:  Physical Therapy (PT), Occupational Therapy (OT), 24 hour per day rehabilitation nursing, Therapeutic Recreaction (TR), Case Management (Social Worker), Rehabilitation Medicine, Nutrition Services and Pharmacy Services  Weekly team conferences will be held on Wednesday to discuss your progress.  Your Social Worker will talk with you frequently to get your input and to update you on team discussions.  Team conferences with you and your family in attendance may also be held.  Expected length of stay: 6-8 days  Overall anticipated outcome: supervision with cueing  Depending on your progress and recovery, your program may change. Your Social Worker will coordinate services and will keep you informed of any changes. Your Social Worker's name and contact numbers are listed  below.  The following services may also be recommended but are not provided by the [facility] will be made to provide these services after discharge if needed.  Arrangements include referral to agencies that provide these services.  Your insurance has been verified to be: [employer]-Medicare Your primary doctor is:  Programmer, applications  Pertinent information will be shared with your doctor and your insurance company.  Social Worker:  Ovidio Kin,  [place] or ([phone]  Information discussed with and copy given to patient by: Elease Hashimoto, 01/04/2019, 1:51 PM

## 2019-01-04 NOTE — Progress Notes (Signed)
Physical Therapy Session Note  Patient Details  Name: Robert Rowe MRN: [id number] Date of Birth: 08-21-1918  Today's Date: 01/04/2019 PT Individual Time: 1630-1730    60 min   Short Term Goals: Week 1:  PT Short Term Goal 1 (Week 1): =LTGs due to ELOS  Skilled Therapeutic Interventions/Progress Updates:   Pt received sitting in recliner and agreeable to PT. Ambulatory transfer without AD to [facility] and min assist from PT. Pt transported to rehab gym in [facility].   Gait training with RW x 177f and CGA. Min cues for AD management in turns and decreased speed to improve safety.   PT instructed pt in Berg balance test. See below for details. Patient demonstrates increased fall risk as noted by score of   26/56 on Berg Balance Scale.  (<36= high risk for falls, close to 100%; 37-45 significant >80%; 46-51 moderate >50%; 52-55 lower >25%) multiple prolonged rest breaks due to fatigue in standing.   Stand pivot transfer to nustep with RW and CGA. Nustep reciprocal endurance training with supervision assist from PT 3 min + 2 minutes with prolonged rest breat between bouts  [facility] and all needs met.        Therapy Documentation Precautions:  Precautions Precautions: Fall Restrictions Weight Bearing Restrictions: No Vital Signs: Therapy Vitals Pulse Rate: 79 Resp: 16 BP: 127/71 Patient Position (if appropriate): Sitting Oxygen Therapy SpO2: 93 % O2 Device: Room Air Pain:   deneis     Balance: Standardized Balance Assessment Standardized Balance Assessment: Berg Balance Test Berg Balance Test Sit to Stand: Able to stand  independently using hands Standing Unsupported: Able to stand 2 minutes with supervision Sitting with Back Unsupported but Feet Supported on Floor or Stool: Able to sit safely and securely 2 minutes Stand to Sit: Uses backs of legs against chair to control descent Transfers: Able to transfer with verbal  cueing and /or supervision Standing Unsupported with Eyes Closed: Able to stand 10 seconds with supervision Standing Ubsupported with Feet Together: Needs help to attain position but able to stand for 30 seconds with feet together From Standing, Reach Forward with Outstretched Arm: Can reach forward >5 cm safely (2") From Standing Position, Pick up Object from Floor: Able to pick up shoe, needs supervision From Standing Position, Turn to Look Behind Over each Shoulder: Needs supervision when turning Turn 360 Degrees: Needs assistance while turning Standing Unsupported, Alternately Place Feet on Step/Stool: Needs assistance to keep from falling or unable to try Standing Unsupported, One Foot in Front: Needs help to step but can hold 15 seconds Standing on One Leg: Tries to lift leg/unable to hold 3 seconds but remains standing independently Total Score: 26    Therapy/Group: Individual Therapy  ALorie Phenix2/14/2020, 4:49 PM

## 2019-01-04 NOTE — Progress Notes (Signed)
Per nursing, patient was given "Data Collection Information Summary for Patients in Inpatient Rehabilitation Facilities with attached Privacy Act Statement Health Care Records" upon admission.    Patient information reviewed and entered into [facility] System by Becky Nevaen Tredway, PPS coordinator. Information including medical coding, function ability, and quality indicators will be reviewed and updated through discharge.   

## 2019-01-04 NOTE — Progress Notes (Signed)
Occupational Therapy Session Note  Patient Details  Name: Robert Rowe MRN: [id number] Date of Birth: 1917/11/24  Today's Date: 01/04/2019 OT Individual Time: 1415-1445 OT Individual Time Calculation (min): 30 min    Short Term Goals: Week 1:  OT Short Term Goal 1 (Week 1): STGs = LTG due to short LOS Week 2:     Skilled Therapeutic Interventions/Progress Updates:  Balance/vestibular training;Discharge planning;Self Care/advanced ADL retraining;Therapeutic Activities;Functional mobility training;Patient/family education;Therapeutic Exercise;DME/adaptive equipment instruction;Neuromuscular re-education   1:1 Focus on dynamic balance functional tasks in gym without UE support with alternating one limb stance; stepping in different directions. Pt with difficulty with weight shifts to the left demonstrating more caution. Pt also stood on complaint surface while perform clothespin activity (pins of varying difficulty)  Without UE support with close supervision/ contact guard. Pt does present with decr activity tolerance requiring rest breaks as needed.   Pt ambulated back to him room with RW at a quick speed with a flexed position with close supervision. Pt set up in the recliner to rest at end of session.   Therapy Documentation Precautions:  Precautions Precautions: Fall Restrictions Weight Bearing Restrictions: No General:   Vital Signs: Therapy Vitals Pulse Rate: 79 Resp: 16 BP: 127/71 Patient Position (if appropriate): Sitting Oxygen Therapy SpO2: 93 % O2 Device: Room Air Pain: Pain Assessment Pain Score: 0-No pain   Therapy/Group: Individual Therapy  Willeen Cass [facility] 01/04/2019, 3:51 PM

## 2019-01-04 NOTE — Evaluation (Signed)
Physical Therapy Assessment and Plan  Patient Details  Name: Robert Rowe MRN: [id number] Date of Birth: [birth year]  PT Diagnosis: Abnormality of gait, Cognitive deficits, Difficulty walking, Impaired cognition and Muscle weakness Rehab Potential: Excellent ELOS: 5-7 days   Today's Date: 01/04/2019 PT Individual Time: 0905-1000 PT Individual Time Calculation (min): 55 min    Problem List:  Patient Active Problem List   Diagnosis Date Noted  . 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 Gait disturbance, post-stroke   . BPV (benign positional vertigo) 01/02/2019  . Acute CVA (cerebrovascular accident) ([place]) 01/01/2019  . TIA (transient ischemic attack) 12/31/2018  . CVA (cerebral vascular accident) ([place]) 12/31/2018  . Chest pain 05/15/2018  . CKD (chronic kidney disease) stage 3, GFR 30-59 ml/min (HCC) 05/08/2018  . Other fatigue 06/13/2017  . Abnormal diffusion capacity determined by pulmonary function test 06/13/2017  . Chronic diastolic CHF (congestive heart failure) ([place]) 11/01/2016  . Essential hypertension 11/01/2016  . Intracranial vascular stenosis 11/09/2015  . Mild cognitive impairment 11/09/2015  . 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 Benign prostatic hyperplasia with urinary obstruction 03/19/2015  . Awareness alteration, transient 02/26/2015  . PVC's (premature ventricular contractions) 08/22/2013  . Hypothyroidism 05/09/2013  . Stricture and stenosis of esophagus 03/12/2013  . Dysphagia, unspecified(787.20) 02/22/2013  . DOE (dyspnea on exertion) 03/23/2011  . PALPITATIONS 02/12/2010  . CHEST DISCOMFORT 04/25/2009  . PEDAL EDEMA 05/01/2008  . GERD 12/03/2007  . Dyslipidemia 04/05/2007  . BENIGN PROSTATIC HYPERTROPHY 04/05/2007  . Rheumatoid arthritis ([place]) 04/05/2007  . Osteoarthritis 04/05/2007  . History of cardiovascular disorder 04/05/2007    Past Medical History:  Past Medical History:  Diagnosis Date  . BENIGN PROSTATIC HYPERTROPHY  04/05/2007  . 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 CHEST DISCOMFORT 04/25/2009  . Chronic diastolic CHF (congestive heart failure) ([place])    a. Echo 12/16: mild LVH, EF 55-60%, no RWMA, Gr 1 DD, mild AI, MAC, mild LAE, normal RVF, PASP 37 mmHg  . Chronic kidney disease    chronic   . DJD (degenerative joint disease)   . Dysrhythmia    pvc's   . GERD 12/03/2007  . Hiatal hernia   . History of nuclear stress test    Myoview 1/17: EF 53%, normal perfusion; Low Risk  . HYPERLIPIDEMIA 04/05/2007  . Hypothyroidism   . Osteoarth NOS-Unspec 04/05/2007  . Palpitations 02/12/2010  . PEDAL EDEMA 05/01/2008  . Rheumatoid arthritis(714.0) 04/05/2007  . Shortness of breath dyspnea   . TRANSIENT ISCHEMIC ATTACK, HX OF 04/05/2007   ?    Past Surgical History:  Past Surgical History:  Procedure Laterality Date  . BALLOON DILATION N/A 03/12/2013   Procedure: BALLOON DILATION;  Surgeon: Inda Castle, MD;  Location: [facility];  Service: Endoscopy;  Laterality: N/A;  . CARPAL TUNNEL RELEASE  11/28/2018   Left hand  . CATARACT EXTRACTION  2001   bilateral  . CYSTOSCOPY WITH URETHRAL DILATATION N/A 09/24/2015   Procedure: CYSTOSCOPY WITH COOK BALLOON DILATATION OF URETHRAL STRICTURE ;  Surgeon: Carolan Clines, MD;  Location: [facility];  Service: Urology;  Laterality: N/A;  . CYSTOSCOPY WITH URETHRAL DILATATION N/A 07/20/2016   Procedure: CYSTOSCOPY WITH URETHRAL DILATATION;  Surgeon: Alexis Frock, MD;  Location: [facility];  Service: Urology;  Laterality: N/A;  1 HOUR  . ESOPHAGOGASTRODUODENOSCOPY N/A 03/12/2013   Procedure: ESOPHAGOGASTRODUODENOSCOPY (EGD);  Surgeon: Inda Castle, MD;  Location: [facility];  Service: Endoscopy;  Laterality: N/A;  . HEMORRHOID SURGERY    .  HERNIA REPAIR     inguinal x5  . KNEE SURGERY  arthroscopic   left  . PROSTATE CRYOABLATION     2000  . SHOULDER SURGERY  few years ago   left  . TRANSURETHRAL RESECTION OF PROSTATE N/A 03/19/2015    Procedure: TRANSURETHRAL RESECTION OF THE PROSTATE (TURP);  Surgeon: Carolan Clines, MD;  Location: [facility];  Service: Urology;  Laterality: N/A;    Assessment & Plan Clinical Impression: Patient is a 85 year old right-handed male with history of TIA, PVCs, hypothyroidism, chronic diastolic congestive heart failure, hypertension,CKD. Presented to 12/31/2018 with vertigo as well as limited hearing to the right ear 2-3 days. Patient had made plans to see ENT. There were reports of a fall in the bathroom with no loss of consciousness. Upon arrival patient blood pressure 201/97. Patient has been followed by Dr. Delice Lesch neurology services for the past 6 months 4 episodes of syncope/BPPV as well as bilateral leg weakness and slurred speech. MRA of the head and neck showed intracranial stenosis with significant intracranial stenosis in basilar, right ICA cavernous and supraclinoid junction, moderate narrowing bilateral ACA and right ACA, distal right vertebral and bilateral PCA stenosis. Recurrent symptoms concerning for hypoperfusion causing TIA. Patient was maintained on Plavix. 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 He lives with spouse reportedly independent prior to admission he did use a rolling walker outside the home. MRI of the brain showed a 6 mm acute ischemic nonhemorrhagic infarction of the left occipital lobe. Echocardiogram with ejection fraction of 65% no evidence of left ventricular regional wall motion abnormalities. EEG was negative for seizure. Patient is currently maintained on aspirin 325 mg and Plavix for CVA prophylaxis for 3 months and then either aspirin  or Plavix alone. Tolerating a regular diet. Therapy evaluations completed with recommendations of physical medicine rehabilitation consult. Patient transferred to [facility] on 01/03/2019 .   Patient currently requires min with mobility secondary to muscle weakness, decreased cardiorespiratoy endurance, decreased  safety awareness and decreased memory and decreased standing balance, decreased postural control and decreased balance strategies.  Prior to hospitalization, patient was independent  with mobility and lived with Spouse(wife is younger) in a House home.  Home access is 6Stairs to enter.  Patient will benefit from skilled PT intervention to maximize safe functional mobility, minimize fall risk and decrease caregiver burden for planned discharge home with 24 hour supervision.  Anticipate patient will benefit from follow up [place] at discharge.  PT - End of Session Activity Tolerance: Tolerates < 10 min activity, no significant change in vital signs Endurance Deficit: Yes Endurance Deficit Description: decreased, increased work of breathing w/ all mobility PT Assessment Rehab Potential (ACUTE/IP ONLY): Excellent PT Barriers to Discharge: Home environment access/layout;Other (comments);Medical stability PT Barriers to Discharge Comments: 6 steps to enter home, pt 16 years old w/ multiple medical comorbidities PT Patient demonstrates impairments in the following area(s): Balance;Endurance;Motor;Safety PT Transfers Functional Problem(s): Bed Mobility;Bed to Chair;Car;Furniture;Floor PT Locomotion Functional Problem(s): Stairs;Ambulation PT Plan PT Intensity: Minimum of 1-2 x/day ,45 to 90 minutes PT Frequency: 5 out of 7 days PT Duration Estimated Length of Stay: 5-7 days PT Treatment/Interventions: Ambulation/gait training;Cognitive remediation/compensation;Discharge planning;DME/adaptive equipment instruction;Functional mobility training;Pain management;Psychosocial support;Splinting/orthotics;Therapeutic Activities;UE/LE Strength taining/ROM;UE/LE Coordination activities;Therapeutic Exercise;Stair training;Skin care/wound management;Patient/family education;Neuromuscular re-education;Functional electrical stimulation;Disease management/prevention;Community reintegration;Balance/vestibular training PT  Transfers Anticipated Outcome(s): supervision PT Locomotion Anticipated Outcome(s): supervision household gait w/ LRAD PT Recommendation Follow Up Recommendations: Home health PT Patient destination: Home Equipment Recommended: To be determined Equipment  Details: has RW already  Skilled Therapeutic Intervention  Pt in supine and agreeable to therapy, all needs met. Bed mobility, transfers, gait, and stair negotiation as detailed below. Additionally performed car transfer w/ CGA. Practiced ambulating w/o AD, CGA overall w/ verbal and visual cues for environmental awareness and to decrease speed of gait. Frequent seated rest breaks 2/2 fatigue and increased work of breathing w/ all mobility. Pt denied SOB. Pt very verbose and often repeating himself throughout session. Pt admittedly stated "I have no short term memory, it was like that before the stroke". Returned to room and performed toilet transfer w/ CGA, pt performed pericare and LE garment management w/ CGA as well. Instructed pt in results of PT evaluation as detailed below, PT POC, rehab potential, rehab goals, and discharge recommendations. Will update wife as she becomes available 2/2 pt's short term memory deficits (baseline). Additionally discussed CIR's policies regarding fall safety and use of chair alarm and/or quick release belt. Pt verbalized understanding and in agreement. Ended session in w/c, all needs in reach.  PT Evaluation Precautions/Restrictions Precautions Precautions: Fall Restrictions Weight Bearing Restrictions: No General   Vital SignsTherapy Vitals Pulse Rate: 87 Resp: 17 BP: (!) 105/93 Patient Position (if appropriate): Sitting Oxygen Therapy SpO2: 97 % O2 Device: Room Air Pain Pain Assessment Pain Scale: 0-10 Pain Score: 0-No pain Home Living/Prior Functioning Home Living Available Help at Discharge: Family;Available 24 hours/day Type of Home: House Home Access: Stairs to enter [employer] of Steps: 6 Entrance Stairs-Rails: Right Home Layout: Two level;Bed/bath upstairs;1/2 bath on main level(uses chair lift to access the 2nd floor) Alternate Level Stairs-Number of Steps: flight w/ chair lift Bathroom Shower/Tub: Chiropodist: Standard Bathroom Accessibility: Yes  Lives With: Spouse(wife is younger) Prior Function Level of Independence: Independent with basic ADLs;Independent with gait;Independent with homemaking with ambulation;Independent with transfers(occasional RW use in community)  Able to Take Stairs?: Yes Driving: No Vocation: Retired Biomedical scientist: Retired MD Comments: enjoys going out to eat w/ friends occasionally, wife drives him to/from Vision/Perception  Geologist, 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 Overall Cognitive Status: History of cognitive impairments - at baseline Arousal/Alertness: Awake/alert Orientation Level: Oriented X4 Focused Attention: Appears intact Memory: Impaired Memory Impairment: Decreased short term memory;Decreased recall of new information Awareness: Appears intact Problem Solving: Appears intact Reasoning: Appears intact Safety/Judgment: Appears intact Sensation Sensation Light Touch: Appears Intact Coordination Gross Motor Movements are Fluid and Coordinated: Yes Motor  Motor Motor: Within Functional Limits Motor - Skilled Clinical Observations: generalized weakness  Mobility Bed Mobility Bed Mobility: Rolling Right;Rolling Left;Supine to Sit;Sit to Supine Rolling Right: Supervision/verbal cueing Rolling Left: Supervision/Verbal cueing Supine to Sit: Supervision/Verbal cueing Sit to Supine: Supervision/Verbal cueing Transfers Transfers: Sit to Stand;Stand to Sit;Stand Pivot Transfers Sit to Stand: Contact Guard/Touching assist Stand to Sit: Contact Guard/Touching assist Stand Pivot Transfers: Contact Guard/Touching assist Transfer (Assistive device):  None Locomotion  Gait Ambulation: Yes Gait Assistance: Contact Guard/Touching assist Gait Distance (Feet): 150 Feet Assistive device: Rolling walker Gait Gait: Yes Gait Pattern: Impaired Gait Pattern: Shuffle;Wide base of support;Poor foot clearance - right;Poor foot clearance - left;Trunk flexed Gait velocity: decreased Stairs / Additional Locomotion Stairs: Yes Stairs Assistance: Contact Guard/Touching assist Stair Management Technique: One rail Right Number of Stairs: 4 Height of Stairs: 6 Wheelchair Mobility Wheelchair Mobility: No  Trunk/Postural Assessment  Cervical Assessment Cervical Assessment: Exceptions to WFL(forward head posture) Thoracic Assessment Thoracic Assessment: Exceptions to WFL(rounded shoulders) Lumbar Assessment Lumbar Assessment: Exceptions to WFL(posterior pelvic tilt, flexed at  trunk ) Postural Control Postural Control: Deficits on evaluation(delayed)  Balance Balance Balance Assessed: Yes Static Sitting Balance Static Sitting - Balance Support: Feet supported;No upper extremity supported Static Sitting - Level of Assistance: 5: Stand by assistance Dynamic Sitting Balance Dynamic Sitting - Balance Support: No upper extremity supported;Feet supported Dynamic Sitting - Level of Assistance: 5: Stand by assistance Static Standing Balance Static Standing - Balance Support: No upper extremity supported;During functional activity Static Standing - Level of Assistance: 4: Min assist(CGA) Dynamic Standing Balance Dynamic Standing - Balance Support: No upper extremity supported;During functional activity Dynamic Standing - Level of Assistance: 4: Min assist Extremity Assessment  RLE Assessment RLE Assessment: Exceptions to [facility] Passive Range of Motion (PROM) Comments: WFL General Strength Comments: globally 3+ to 4/5  LLE Assessment LLE Assessment: Exceptions to [facility] Passive Range of Motion (PROM) Comments: WFL General Strength Comments: Globally 3+  to 4/5     Refer to Care Plan for Long Term Goals  Recommendations for other services: None   Discharge Criteria: Patient will be discharged from PT if patient refuses treatment 3 consecutive times without medical reason, if treatment goals not met, if there is a change in medical status, if patient makes no progress towards goals or if patient is discharged from hospital.  The above assessment, treatment plan, treatment alternatives and goals were discussed and mutually agreed upon: by patient  Gwendolin Briel K Clemetine Marker 01/04/2019, 9:38 AM

## 2019-01-04 NOTE — Progress Notes (Signed)
Retta Diones, RN  Rehab Admission Coordinator  Physical Medicine and Rehabilitation  PMR Pre-admission  Signed  Date of Service:  01/03/2019 10:43 AM       Related encounter: ED to Hosp-Admission (Discharged) from 12/31/2018 in [facility]      Signed         Show:Clear all _0 Manual_1 Template_2 Copied  Added by: _3 Retta Diones, RN  _4 Hover for details PMR Admission Coordinator Pre-Admission Assessment  Patient: Robert Rowe is an 85 y.o., male MRN: [id number] DOB: 06-03-1918 Height: _5  (157.5 cm) Weight: 65.8 kg                                                                                                                                                  Insurance Information HMO: No    PPO:       PCP:       IPA:       80/20:       OTHER:   PRIMARY: UHC medicare      Policy#: [id number]      Subscriber: patient CM Name: Junie Panning      Phone#:       Fax#: [phone] Pre-Cert#: [id number]      Employer:  Retired Benefits:  Phone #: [phone]     Name:  On line portal Eff. 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 Presented to 12/31/2018 with vertigo as well as deafness right ear 2-3 days.  Patient had made plans to see an ENT. There were reports of a fall in the bathroom with no loss of consciousness. Upon arrival patient's blood pressure 201/97. Patient had been seen approximately 6 months ago by Dr. Delice Lesch for episodes of syncope/BPPVas well as bilateral leg weakness and slurred speech. 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  Recurrent symptoms were concerning for hypoperfusion causing TIA. Patient was maintained on Plavix. 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 He lives with his spouse and reportedly independent prior to admission inside the home and used a rolling walker outside of the house.Marland Kitchen MRI the brain showed a 6 mm acute ischemic nonhemorrhagic infarction involving the left occipital lobe.Echocardiogram pending. Troponin was negative. Patient currently is maintained on aspirin and Plavix for CVA prophylaxis. Tolerating a regular diet. Therapy evaluation completed with recommendations of physical medicine rehabilitation consult.  Complete NIHSS TOTAL: 1  Past Medical History      Past Medical History:  Diagnosis Date  . BENIGN PROSTATIC HYPERTROPHY 04/05/2007  . 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 CHEST DISCOMFORT 04/25/2009  . Chronic diastolic CHF (congestive heart failure) ([place])    a. Echo 12/16: mild LVH, EF 55-60%, no RWMA, Gr 1 DD, mild AI, MAC, mild LAE, normal RVF, PASP 37 mmHg  . Chronic kidney disease    chronic   . DJD (degenerative joint disease)   . Dysrhythmia    pvc's   . GERD 12/03/2007  . Hiatal hernia   . History of nuclear stress test    Myoview 1/17: EF 53%, normal perfusion; Low Risk  . HYPERLIPIDEMIA 04/05/2007  . Hypothyroidism   . Osteoarth NOS-Unspec 04/05/2007  .  Palpitations 02/12/2010  . PEDAL EDEMA 05/01/2008  . Rheumatoid arthritis(714.0) 04/05/2007  . Shortness of breath dyspnea   . TRANSIENT ISCHEMIC ATTACK, HX OF 04/05/2007   ?     Family History  family history includes Breast cancer in his mother; Heart attack in his father; Heart disease in his father; Lymphoma in his brother.  Prior Rehab/Hospitalizations: No previous rehab  Has the patient had major surgery during 100 days prior to admission? No  Current Medications   Current Facility-Administered Medications:  .  acetaminophen (TYLENOL) tablet 650 mg, 650 mg, Oral, Q6H PRN **OR** acetaminophen (TYLENOL) suppository 650 mg, 650 mg, Rectal, Q6H PRN, Doutova, Anastassia, MD .  albuterol (PROVENTIL) (2.5 MG/3ML) 0.083% nebulizer solution 2.5 mg, 2.5 mg, Nebulization, Q2H PRN, Doutova, Anastassia, MD .  aspirin EC tablet 81 mg, 81 mg, Oral, Daily, Arrien, Jimmy Picket, MD, 81 mg at 01/03/19 1013 .  atorvastatin (LIPITOR) tablet 80 mg, 80 mg, Oral, q1800, Doutova, Anastassia, MD, 80 mg at 01/02/19 1832 .  clopidogrel (PLAVIX) tablet 75 mg, 75 mg, Oral, Daily, Doutova, Anastassia, MD, 75 mg at 01/03/19 1013 .  latanoprost (XALATAN) 0.005 % ophthalmic solution 1 drop, 1 drop, Both Eyes, QHS, Doutova, Anastassia, MD, 1 drop at 01/02/19 2242 .  levothyroxine (SYNTHROID, LEVOTHROID) tablet 50 mcg, 50 mcg, Oral, QAC breakfast, Doutova, Anastassia, MD, 50 mcg at 01/03/19 6270 .  metoprolol succinate (TOPROL-XL) 24 hr tablet 25 mg, 25 mg, Oral, Daily, Arrien, Jimmy Picket, MD, 25 mg at 01/02/19 1846 .  ondansetron (ZOFRAN) tablet 4 mg, 4 mg, Oral, Q6H PRN **OR** ondansetron (ZOFRAN) injection 4 mg, 4 mg, Intravenous, Q6H PRN, Doutova, Anastassia, MD .  pantoprazole (PROTONIX) EC tablet 40 mg, 40 mg, Oral, QAC breakfast, Doutova, Anastassia, MD, 40 mg at 01/03/19 3500  Patients Current Diet:     Diet Order                  Diet - low sodium heart healthy         Diet Heart  Room service appropriate? Yes; Fluid consistency: Thin  Diet effective now  Precautions / Restrictions Precautions Precautions: Fall Precaution Comments: dizzy with all motion Restrictions Weight Bearing Restrictions: No   Has the patient had 2 or more falls or a fall with injury in the past year?No.  Patient had 1 fall recently associated with current stroke.  Prior Activity Level Limited Community (1-2x/wk): Went out 2-3 X a week.  Not driving.  Home Assistive Devices / Equipment Home Equipment: Walker - 2 wheels  Prior Device Use: Indicate devices/aids used by the patient prior to current illness, exacerbation or injury? Walker.  He has only used the walker 1 X most recently.  Prior Functional Level Prior Function Level of Independence: Independent Comments: ADLs and simple IADLs. used no AD in house, used RW occasionally outside of home  Self Care: Did the patient need help bathing, dressing, using the toilet or eating?  Independent  Indoor Mobility: Did the patient need assistance with walking from room to room (with or without device)? Independent  Stairs: Did the patient need assistance with internal or external stairs (with or without device)? Independent  Functional Cognition: Did the patient need help planning regular tasks such as shopping or remembering to take medications? Independent  Current Functional Level Cognition  Arousal/Alertness: Awake/alert Overall Cognitive Status: Impaired/Different from baseline Orientation Level: Oriented X4 General Comments: pt repeatedly stating that he can't remember anything (which is not his baseline cognition) Attention: Focused Focused Attention: Appears intact Memory: Impaired Memory Impairment: Retrieval deficit Awareness: Appears intact Problem Solving: Appears intact Executive Function: Reasoning Reasoning: Appears intact Safety/Judgment: Appears intact    Extremity  Assessment (includes Sensation/Coordination)  Upper Extremity Assessment: Generalized weakness  Lower Extremity Assessment: Defer to PT evaluation    ADLs  Overall ADL's : Needs assistance/impaired Eating/Feeding: Supervision/ safety, Set up, Sitting Grooming: Wash/dry face, Min guard, Standing, Minimal assistance Upper Body Bathing: Min guard, Sitting Lower Body Bathing: Minimal assistance, +2 for safety/equipment, Sit to/from stand Lower Body Bathing Details (indicate cue type and reason): Pt performing peri care while at sink and required Min A for balance and gown management. Upper Body Dressing : Min guard, Sitting Upper Body Dressing Details (indicate cue type and reason): Donning second gown like a jacket Lower Body Dressing: Minimal assistance, Sit to/from stand Lower Body Dressing Details (indicate cue type and reason): Pt able to bend forward to adjust socks but became dizzy Toilet Transfer: Minimal assistance, Ambulation, RW(simulated to recliner) Toileting - Clothing Manipulation Details (indicate cue type and reason): Pt performing peri care while at sink and required Min A for balance and gown management. Functional mobility during ADLs: Minimal assistance, Rolling walker General ADL Comments: Pt presenting with dizziness, decreased strength, poor balance, and decreased ST memory.    Mobility  General bed mobility comments: pt sitting EOB upon arrival    Transfers  Overall transfer level: Needs assistance Equipment used: Rolling walker (2 wheeled) Transfers: Sit to/from Stand Sit to Stand: Min guard Stand pivot transfers: Mod assist General transfer comment: min guard for safety; pt pulling up on RW with bilateral UEs despite tactile and verbal cueing for safe hand placement    Ambulation / Gait / Stairs / Wheelchair Mobility  Ambulation/Gait Ambulation/Gait assistance: Min assist, Min guard Gait Distance (Feet): 15 Feet Assistive device: Rolling walker (2  wheeled) Gait Pattern/deviations: Step-to pattern, Step-through pattern, Decreased step length - right, Decreased step length - left, Decreased stride length, Trunk flexed General Gait Details: pt with mild instability with ambulation, limited secondary to fatigue and weakness. Pt ambulated within room with min  guard to min A level for safety and stability Gait velocity: decreased    Posture / Balance Dynamic Sitting Balance Sitting balance - Comments: pt able to sit EOB with min guard and adjust socks without difficulties Balance Overall balance assessment: Needs assistance Sitting-balance support: Feet supported Sitting balance-Leahy Scale: Fair Sitting balance - Comments: pt able to sit EOB with min guard and adjust socks without difficulties Standing balance support: Bilateral upper extremity supported, Single extremity supported Standing balance-Leahy Scale: Poor Standing balance comment: pt able to stand and sink and perform ADLs tasks with close min guard and occasional min A with 1-2 UE supports    Special needs/care consideration BiPAP/CPAP No CPM No Continuous Drip IV No Dialysis No     Life Vest No Oxygen No Special Bed No Trach Size No Wound Vac (area) No  Skin No             Bowel mgmt: Last Bm 01/01/19 Bladder mgmt: Voiding in urinal Diabetic mgmt No Additional:  Has decreased hearing R ear, dizziness.  Has memory has decreased in the last 2 years per patient.      Previous Home Environment Living Arrangements: Spouse/significant other  Lives With: Spouse Available Help at Discharge: Family, Available 24 hours/day Type of Home: House Home Layout: Two level(chair lift) Alternate Level Stairs-Number of Steps: flight Home Access: Stairs to enter Entrance Stairs-Rails: Right Entrance Stairs-Number of Steps: 4 Bathroom Shower/Tub: Chiropodist: Standard  Discharge Living Setting Plans for Discharge Living Setting: Lives with (comment),  Other (Comment)(Lives with wife in a condominium) Type of Home at Discharge: Other (Comment)(condominium) Discharge Home Layout: Two level, 1/2 bath on main level, Bed/bath upstairs Alternate Level Stairs-Number of Steps: 11 steps Discharge Home Access: Stairs to enter Entrance Stairs-Number of Steps: 6 steps Discharge Bathroom Shower/Tub: Tub/shower unit, Curtain Discharge Bathroom Toilet: Standard Discharge Bathroom Accessibility: Yes How Accessible: Accessible via walker  Social/Family/Support Systems Patient Roles: Spouse, Parent(Has a wife and 3 sons, 1 daughter.) Contact Information: Flay Ghosh - wife Anticipated Caregiver: wife Anticipated Caregiver's Contact Information: Soyla Murphy - wife - [phone] Ability/Limitations of Caregiver: Wife is retired, is younger and can assist. Caregiver Availability: 24/7 Discharge Plan Discussed with Primary Caregiver: Yes Is Caregiver In Agreement with Plan?: Yes Does Caregiver/Family have Issues with Lodging/Transportation while Pt is in Rehab?: No  Goals/Additional Needs Patient/Family Goal for Rehab: PT/OT mod I goals Expected length of stay: 7-11 days Cultural Considerations: None Dietary Needs: Heart diet, thin liquids Equipment Needs: TBD Pt/Family Agrees to Admission and willing to participate: Yes Program Orientation Provided & Reviewed with Pt/Caregiver Including Roles  & Responsibilities: Yes  Decrease burden of Care through IP rehab admission: N/A  Possible need for SNF placement upon discharge: Not anticipated  Patient Condition: This patient's medical and functional status has changed since the consult dated: 01/01/19 in which the Rehabilitation Physician determined and documented that the patient's condition is appropriate for intensive rehabilitative care in an inpatient rehabilitation facility. See "History of Present Illness" (above) for medical update. Functional changes are: Currently requiring min to min  guard to ambulate 15 feet RW . Patient's medical and functional status update has been discussed with the Rehabilitation physician and patient remains appropriate for inpatient rehabilitation. Will admit to inpatient rehab today.  Preadmission Screen Completed By:  Retta Diones, 01/03/2019 10:57 AM ______________________________________________________________________   Discussed status with Dr. Letta Pate on 01/03/19 at 1057 and received telephone approval for admission today.  Admission Coordinator:  Retta Diones, time 1057/Date  01/03/19           Cosigned by: Charlett Blake, MD at 01/03/2019 11:19 AM  Revision History

## 2019-01-05 ENCOUNTER — Inpatient Hospital Stay (HOSPITAL_COMMUNITY): Payer: Medicare Other | Admitting: Occupational Therapy

## 2019-01-05 ENCOUNTER — Inpatient Hospital Stay (HOSPITAL_COMMUNITY): Payer: Medicare Other | Admitting: Physical Therapy

## 2019-01-05 DIAGNOSIS — I10 Essential (primary) hypertension: Secondary | ICD-10-CM

## 2019-01-05 MED ORDER — MECLIZINE HCL 25 MG PO TABS: 12.5000 mg | ORAL_TABLET | Freq: Three times a day (TID) | ORAL | Status: DC | PRN

## 2019-01-05 NOTE — Progress Notes (Signed)
Physical Therapy Session Note  Patient Details  Name: Robert Rowe MRN: [id number] Date of Birth: [birth year]  Today's Date: 01/05/2019 PT Individual Time: 0900-0955 AND 1300-1400 PT Individual Time Calculation (min): 55 min AND 60 min  Short Term Goals: Week 1:  PT Short Term Goal 1 (Week 1): =LTGs due to ELOS  Skilled Therapeutic Interventions/Progress Updates:   Session 1:  Pt sitting EOB and agreeable to therapy, denies pain. Pt ambulated to/from therapy gym w/ CGA-close supervision, >150' w/ RW. Verbal cues to decreased gait speed for safety. Session focused on functional endurance and balance. Stood in 2-3 min bouts (x6 reps in total) while performing cognitive tasks requiring trunk rotation, reaching, and weight shifting. Encouraged pt to not rely on UE support for balance. Tasks emphasized short term memory, problem solving, and visual scanning of entire environment. Put together pipe tree and performed card matching task. Min verbal cues for successful completion of task. Moderate increase in work of breathing w/ all activity, pt denied SOB and using pursed-lip breathing throughout. Returned to room and assisted w/ toilet transfer, CGA. Ended session in recliner, all needs in reach.   Session 2:  Pt in recliner and agreeable to therapy, no c/o pain. Ambulated to/from therapy gym w/ supervision using RW. Worked on balance w/o UE support while tossing horsehoes and then [employer] reaching in all directions. CGA-min assist for balance w/o AD. Tactile and verbal cues throughout for upright posture. NuStep 2 min x3 @ level 3 for global strengthening and endurance. Seated rest breaks throughout session 2/2 fatigue/increased work of breathing. Returned to room and ended session in recliner, all needs in reach.   Therapy Documentation Precautions:  Precautions Precautions: Fall Restrictions Weight Bearing Restrictions: No Pain: Pain Assessment Pain Scale: 0-10 Pain Score: 0-No  pain  Therapy/Group: Individual Therapy  Hafsa Lohn Clent Demark 01/05/2019, 10:31 AM

## 2019-01-05 NOTE — Progress Notes (Signed)
[facility] PHYSICAL MEDICINE & REHABILITATION PROGRESS NOTE   Subjective/Complaints:  Pt slept well. Still having some dizziness when up with therapies. Feels that it's better though  ROS: Patient denies fever, rash, sore throat, blurred vision, nausea, vomiting, diarrhea, cough, shortness of breath or chest pain, joint or back pain, headache, or mood change.    Objective:   No results found. Recent Labs    01/04/19 0516  WBC 5.9  HGB 13.5  HCT 42.5  PLT 225   Recent Labs    01/03/19 0514 01/04/19 0516  NA 142 138  K 4.0 3.8  CL 106 105  CO2 25 24  GLUCOSE 117* 128*  BUN 16 21  CREATININE 1.61* 1.63*  CALCIUM 8.8* 9.0    Intake/Output Summary (Last 24 hours) at 01/05/2019 1012 Last data filed at 01/05/2019 0902 Gross per 24 hour  Intake 600 ml  Output 500 ml  Net 100 ml     Physical Exam: Vital Signs Blood pressure 132/83, pulse 84, temperature 97.8 F (36.6 C), temperature source Oral, resp. rate 16, height 5\' 2"  (1.575 m), weight 64.5 kg, SpO2 96 %.   Constitutional: No distress . Vital signs reviewed. HEENT: EOMI, oral membranes moist Neck: supple Cardiovascular: RRR without murmur. No JVD    Respiratory: CTA Bilaterally without wheezes or rales. Normal effort    GI: BS +, non-tender, non-distended  Extremities: No clubbing, cyanosis, or edema Skin: No evidence of breakdown, no evidence of rash Neurologic: Cranial nerves II through XII intact, motor strength is 5/5 in bilateral deltoid, bicep, tricep, grip, hip flexor, knee extensors, ankle dorsiflexor and plantar flexor   Musculoskeletal: Full range of motion in all 4 extremities. No joint swelling   Assessment/Plan: 1. Functional deficits secondary to Left occipital infarct  which require 3+ hours per day of interdisciplinary therapy in a comprehensive inpatient rehab setting.  Physiatrist is providing close team supervision and 24 hour management of active medical problems listed  below.  Physiatrist and rehab team continue to assess barriers to discharge/monitor patient progress toward functional and medical goals  Care Tool:  Bathing    Body parts bathed by patient: Right arm, Left arm, Chest, Abdomen, Front perineal area, Buttocks, Right upper leg, Left upper leg, Right lower leg, Left lower leg, Face         Bathing assist Assist Level: Contact Guard/Touching assist     Upper Body Dressing/Undressing Upper body dressing   What is the patient wearing?: Hospital gown only    Upper body assist Assist Level: Independent    Lower Body Dressing/Undressing Lower body dressing      What is the patient wearing?: Hospital gown only     Lower body assist Assist for lower body dressing: Independent     Toileting Toileting    Toileting assist Assist for toileting: Supervision/Verbal cueing Assistive Device Comment: urinal   Transfers Chair/bed transfer  Transfers assist     Chair/bed transfer assist level: Contact Guard/Touching assist     Locomotion Ambulation   Ambulation assist      Assist level: Contact Guard/Touching assist Assistive device: Walker-rolling Max distance: 150'   Walk 10 feet activity   Assist     Assist level: Contact Guard/Touching assist Assistive device: Walker-rolling   Walk 50 feet activity   Assist    Assist level: Contact Guard/Touching assist Assistive device: Walker-rolling    Walk 150 feet activity   Assist    Assist level: Contact Guard/Touching assist Assistive device: Walker-rolling  Walk 10 feet on uneven surface  activity   Assist Walk 10 feet on uneven surfaces activity did not occur: Safety/medical concerns         Wheelchair     Assist Will patient use wheelchair at discharge?: No   Wheelchair activity did not occur: N/A         Wheelchair 50 feet with 2 turns activity    Assist    Wheelchair 50 feet with 2 turns activity did not occur: N/A        Wheelchair 150 feet activity     Assist Wheelchair 150 feet activity did not occur: N/A         Medical Problem List and Plan: 1.  Gait instability with syncope secondary to left occipital infarction, likely vertebrobasilar insufficiency  CIR PT, OT ongoing  -can try low dose meclizine for dizziness  2.  DVT Prophylaxis/Anticoagulation: SCDs. Monitor for any signs of DVT- no calf pain or swelling  3. Pain Management:  Tylenol as needed 4. Mood:  Provide emotional support 5. Neuropsych: This patient is capable of making decisions on his own behalf. 6. Skin/Wound Care: routine skin checks 7. Fluids/Electrolytes/Nutrition:  Routine in and out's with follow-up chemistries, monitor meal intake 8.  Chronic diastolic congestive heart failure. Monitor for any signs of fluid overload. Patient on Lasix 20-40 mg as needed prior to admission. Resume as needed No pedal edema  -check daily weights  - Filed Weights   01/03/19 1342  Weight: 64.5 kg    9. CKD. Creatinine baseline 1.56-1.70 10. Hypertension. Toprol-XL 25 mg daily, monitor vitals q shift Vitals:   01/04/19 2038 01/05/19 0405  BP: (!) 159/91 132/83  Pulse: 85 84  Resp: 16 16  Temp: (!) 97.4 F (36.3 C) 97.8 F (36.6 C)  SpO2: 98% 96%  fluctuating BP, no consistent pattern--follow for now, treat prn if needed 11. Hypothyroidism.  TSH 2.124. Continue Synthroid 12. Hyperlipidemia. Lipitor    LOS: 2 days A FACE TO FACE EVALUATION WAS PERFORMED  [name] 01/05/2019, 10:12 AM

## 2019-01-05 NOTE — Progress Notes (Signed)
Occupational Therapy Session Note  Patient Details  Name: Robert Rowe MRN: [id number] Date of Birth: 12/11/1917  Today's Date: 01/05/2019 OT Individual Time: 1100-1208 OT Individual Time Calculation (min): 68 min    Short Term Goals: Week 1:  OT Short Term Goal 1 (Week 1): STGs = LTG due to short LOS  Skilled Therapeutic Interventions/Progress Updates:    Pt seen this session to focus on his activity tolerance and balance to increase independence with ADLs.  Pt received in recliner in gown. He donned his clothing with supervision only except for A with TEDs. Pt donned shoes without difficulty.   He ambulated with RW with CGA for 300 ft with cues to slow down and keep walker by his sides vs pushing too far forward.   Pt taken to ADL apt. Sit to stand from low couch close S.  Tub bench transfer supervision with min cues.    Pt ambulated to gym and worked on balance by stepping on various targets with min A for balance support especially with lunging to his R side.  Sit to stands holding weighted bar and lifting bar to shoulder height 8x 2 sets.   Pt ambulated back to room and toileted, washed hands at sink. Returned to [employer]. Belt alarm on and meal tray set up in front of pt. Pt in room with all needs met.   Therapy Documentation Precautions:  Precautions Precautions: Fall Restrictions Weight Bearing Restrictions: No    Pain: Pain Assessment Pain Score: 0-No pain   Therapy/Group: Individual Therapy  [place] 01/05/2019, 12:29 PM

## 2019-01-06 ENCOUNTER — Inpatient Hospital Stay (HOSPITAL_COMMUNITY): Payer: Medicare Other

## 2019-01-06 NOTE — Progress Notes (Signed)
At 2120, N & V, threw up 50-100cc's partially digested food. Patient reports this is Rowe chronic issue, especially "after eating things I shouldn't." Declined PRN zofran. Bilateral pedal edema, Left > right. Robert Rowe

## 2019-01-06 NOTE — Progress Notes (Signed)
Physical Therapy Session Note  Patient Details  Name: DICKEY CAAMANO MRN: [id number] Date of Birth: 1918/01/25  Today's Date: 01/06/2019 PT Individual Time: 0802-0900 PT Individual Time Calculation (min): 58 min   Short Term Goals: Week 1:  PT Short Term Goal 1 (Week 1): =LTGs due to ELOS  Skilled Therapeutic Interventions/Progress Updates:    PT seated EOB eating breakfast upon PT arrival, agreeable to therapy tx and denies pain. Pt seated EOB donned pants, performed sit<>stand to pull over hips. Pt donned clean shirt with supervision, donned shoes with supervision. Pt ambulated to the sink with RW and CGA, worked on standing balance while brushing teeth. Pt ambulated x 100 ft with RW and CGA. Pt used nustep x 6 minutes this session on workload 4 for global strengthening and endurance. Pt ambulated to the gym x 150 ft with RW and CGA. Pt worked on standing balance this session while tossing horseshoes x 2 trials and then to perform card matching activity, supervision-CGA for balance. Pt worked on LE strengthening to perform sit<>stands 2 x 5, CGA. Pt ambulated back to room and left in recliner with needs in reach and chair alarm set.   Therapy Documentation Precautions:  Precautions Precautions: Fall Restrictions Weight Bearing Restrictions: No    Therapy/Group: Individual Therapy  Netta Corrigan, PT, DPT 01/06/2019, 7:52 AM

## 2019-01-06 NOTE — Progress Notes (Signed)
[facility] PHYSICAL MEDICINE & REHABILITATION PROGRESS NOTE   Subjective/Complaints:  Patient had a small amount of nausea and vomiting last night but did not seem to worried about it as he has had it before at home when he eats things he should not be eating.  No issues this morning.  ROS: Patient denies fever, rash, sore throat, blurred vision, nausea, vomiting, diarrhea, cough, shortness of breath or chest pain, joint or back pain, headache, or mood change.    Objective:   No results found. Recent Labs    01/04/19 0516  WBC 5.9  HGB 13.5  HCT 42.5  PLT 225   Recent Labs    01/04/19 0516  NA 138  K 3.8  CL 105  CO2 24  GLUCOSE 128*  BUN 21  CREATININE 1.63*  CALCIUM 9.0    Intake/Output Summary (Last 24 hours) at 01/06/2019 0917 Last data filed at 01/06/2019 0742 Gross per 24 hour  Intake 720 ml  Output 625 ml  Net 95 ml     Physical Exam: Vital Signs Blood pressure (!) 110/56, pulse 85, temperature 98.2 F (36.8 C), resp. rate 16, height 5\' 2"  (1.575 m), weight 63.5 kg, SpO2 91 %.   Constitutional: No distress . Vital signs reviewed. HEENT: EOMI, oral membranes moist Neck: supple Cardiovascular: RRR without murmur. No JVD    Respiratory: CTA Bilaterally without wheezes or rales. Normal effort    GI: BS +, non-tender, non-distended  Extremities: No clubbing, cyanosis, or edema Skin: No evidence of breakdown, no evidence of rash Neurologic: Cranial nerves II through XII intact, motor strength is 5/5 in bilateral deltoid, bicep, tricep, grip, hip flexor, knee extensors, ankle dorsiflexor and plantar flexor   Musculoskeletal: Full range of motion in all 4 extremities. No joint swelling   Assessment/Plan: 1. Functional deficits secondary to Left occipital infarct  which require 3+ hours per day of interdisciplinary therapy in a comprehensive inpatient rehab setting.  Physiatrist is providing close team supervision and 24 hour management of active medical  problems listed below.  Physiatrist and rehab team continue to assess barriers to discharge/monitor patient progress toward functional and medical goals  Care Tool:  Bathing    Body parts bathed by patient: Right arm, Left arm, Chest, Abdomen, Front perineal area, Buttocks, Right upper leg, Left upper leg, Right lower leg, Left lower leg, Face         Bathing assist Assist Level: Contact Guard/Touching assist     Upper Body Dressing/Undressing Upper body dressing   What is the patient wearing?: Hospital gown only    Upper body assist Assist Level: Set up assist    Lower Body Dressing/Undressing Lower body dressing      What is the patient wearing?: Hospital gown only     Lower body assist Assist for lower body dressing: Supervision/Verbal cueing     Toileting Toileting    Toileting assist Assist for toileting: Supervision/Verbal cueing Assistive Device Comment: urinal   Transfers Chair/bed transfer  Transfers assist     Chair/bed transfer assist level: Contact Guard/Touching assist     Locomotion Ambulation   Ambulation assist      Assist level: Contact Guard/Touching assist Assistive device: Walker-rolling Max distance: 150'   Walk 10 feet activity   Assist     Assist level: Contact Guard/Touching assist Assistive device: Walker-rolling   Walk 50 feet activity   Assist    Assist level: Contact Guard/Touching assist Assistive device: Walker-rolling    Walk 150 feet activity  Assist    Assist level: Contact Guard/Touching assist Assistive device: Walker-rolling    Walk 10 feet on uneven surface  activity   Assist Walk 10 feet on uneven surfaces activity did not occur: Safety/medical concerns         Wheelchair     Assist Will patient use wheelchair at discharge?: No   Wheelchair activity did not occur: N/A         Wheelchair 50 feet with 2 turns activity    Assist    Wheelchair 50 feet with 2 turns  activity did not occur: N/A       Wheelchair 150 feet activity     Assist Wheelchair 150 feet activity did not occur: N/A         Medical Problem List and Plan: 1.  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  DVT Prophylaxis/Anticoagulation: SCDs. Monitor for any signs of DVT- no calf pain or swelling  3. Pain Management:  Tylenol as needed 4. Mood:  Provide emotional support 5. Neuropsych: This patient is capable of making decisions on his own behalf. 6. Skin/Wound Care: routine skin checks 7. Fluids/Electrolytes/Nutrition:  Routine in and out's with follow-up chemistries, monitor meal intake 8.  Chronic diastolic congestive heart failure. Monitor for any signs of fluid overload. Patient on Lasix 20-40 mg as needed prior to admission. Resume as needed No pedal edema  -Daily weights appear stable - Filed Weights   01/03/19 1342 01/06/19 0611  Weight: 64.5 kg 63.5 kg    9. CKD. Creatinine baseline 1.56-1.70 10. Hypertension. Toprol-XL 25 mg daily, monitor vitals q shift Vitals:   01/05/19 2130 01/06/19 0611  BP:  (!) 110/56  Pulse:  85  Resp:  16  Temp:  98.2 F (36.8 C)  SpO2: 97% 91%  BPs appear to be stabilizing.  Continue to follow for pattern.  No new treatment plan at present 11. Hypothyroidism.  TSH 2.124. Continue Synthroid 12. Hyperlipidemia. Lipitor 13. N/V: ?chronic.   -Pt is eating well and moving bowels  -monitor for now    LOS: 3 days A FACE TO FACE EVALUATION WAS PERFORMED  [name] 01/06/2019, 9:17 AM

## 2019-01-07 ENCOUNTER — Inpatient Hospital Stay (HOSPITAL_COMMUNITY): Payer: Medicare Other

## 2019-01-07 NOTE — Progress Notes (Signed)
[facility] PHYSICAL MEDICINE & REHABILITATION PROGRESS NOTE   Subjective/Complaints:  No issues overnite, discussed appetite, , bowels and bladder which are doing ok, Robert Rowe hope to go home on Wednesday  ROS: Patient denies fever nausea, vomiting, diarrhea, cough, shortness of breath or chest pain,   Objective:   No results found. No results for input(s): WBC, HGB, HCT, PLT in the last 72 hours. No results for input(s): NA, K, CL, CO2, GLUCOSE, BUN, CREATININE, CALCIUM in the last 72 hours.  Intake/Output Summary (Last 24 hours) at 01/07/2019 0808 Last data filed at 01/07/2019 0152 Gross per 24 hour  Intake 120 ml  Output 700 ml  Net -580 ml     Physical Exam: Vital Signs Blood pressure 121/69, pulse 93, temperature 97.9 F (36.6 C), temperature source Oral, resp. rate 17, height 5\' 2"  (1.575 m), weight 64.2 kg, SpO2 96 %.   Constitutional: No distress . Vital signs reviewed. HEENT: EOMI, oral membranes moist Neck: supple Cardiovascular: RRR without murmur. No JVD    Respiratory: CTA Bilaterally without wheezes or rales. Normal effort    GI: BS +, non-tender, non-distended  Extremities: No clubbing, cyanosis, or edema Skin: No evidence of breakdown, no evidence of rash Neurologic: Cranial nerves II through XII intact, motor strength is 5/5 in bilateral deltoid, bicep, tricep, grip, hip flexor, knee extensors, ankle dorsiflexor and plantar flexor   Musculoskeletal: Full range of motion in all 4 extremities. No joint swelling   Assessment/Plan: 1. Functional deficits secondary to Left occipital infarct  which require 3+ hours per day of interdisciplinary therapy in a comprehensive inpatient rehab setting.  Physiatrist is providing close team supervision and 24 hour management of active medical problems listed below.  Physiatrist and rehab team continue to assess barriers to discharge/monitor patient progress toward functional and medical goals  Care Tool:  Bathing     Body parts bathed by patient: Right arm, Left arm, Chest, Abdomen, Front perineal area, Buttocks, Right upper leg, Left upper leg, Right lower leg, Left lower leg, Face         Bathing assist Assist Level: Contact Guard/Touching assist     Upper Body Dressing/Undressing Upper body dressing   What is the patient wearing?: Hospital gown only    Upper body assist Assist Level: Set up assist    Lower Body Dressing/Undressing Lower body dressing      What is the patient wearing?: Hospital gown only     Lower body assist Assist for lower body dressing: Supervision/Verbal cueing     Toileting Toileting    Toileting assist Assist for toileting: Supervision/Verbal cueing Assistive Device Comment: urinal   Transfers Chair/bed transfer  Transfers assist     Chair/bed transfer assist level: Contact Guard/Touching assist     Locomotion Ambulation   Ambulation assist      Assist level: Contact Guard/Touching assist Assistive device: Walker-rolling Max distance: 150'   Walk 10 feet activity   Assist     Assist level: Contact Guard/Touching assist Assistive device: Walker-rolling   Walk 50 feet activity   Assist    Assist level: Contact Guard/Touching assist Assistive device: Walker-rolling    Walk 150 feet activity   Assist    Assist level: Contact Guard/Touching assist Assistive device: Walker-rolling    Walk 10 feet on uneven surface  activity   Assist Walk 10 feet on uneven surfaces activity did not occur: Safety/medical concerns         Wheelchair     Assist Will patient use  wheelchair at discharge?: No   Wheelchair activity did not occur: N/A         Wheelchair 50 feet with 2 turns activity    Assist    Wheelchair 50 feet with 2 turns activity did not occur: N/A       Wheelchair 150 feet activity     Assist Wheelchair 150 feet activity did not occur: N/A         Medical Problem List and Plan: 1.   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  DVT Prophylaxis/Anticoagulation: SCDs. Monitor for any signs of DVT- no calf pain or swelling  3. Pain Management:  Tylenol as needed 4. Mood:  Provide emotional support 5. Neuropsych: This patient is capable of making decisions on his own behalf. 6. Skin/Wound Care: routine skin checks 7. Fluids/Electrolytes/Nutrition:  Routine in and out's with follow-up chemistries, monitor meal intake 8.  Chronic diastolic congestive heart failure. Monitor for any signs of fluid overload. Patient on Lasix 20-40 mg as needed prior to admission. Resume as needed No pedal edema  -Daily weights appear stable 2/17-Not on lasix but fluid intake also low <1048ml per day [facility] Weights   01/03/19 1342 01/06/19 0611 01/07/19 0533  Weight: 64.5 kg 63.5 kg 64.2 kg    9. CKD. Creatinine baseline 1.56-1.70 10. Hypertension. Toprol-XL 25 mg daily, monitor vitals q shift Vitals:   01/06/19 1910 01/07/19 0533  BP: 118/63 121/69  Pulse: 80 93  Resp: 18 17  Temp: 98 F (36.7 C) 97.9 F (36.6 C)  SpO2: 100% 96%  BPs controlled  11. Hypothyroidism.  TSH 2.124. Continue Synthroid 12. Hyperlipidemia. Lipitor 13. N/V: ?chronic.   -Robert Rowe is eating well and moving bowels  -monitor for now    LOS: 4 days A FACE TO FACE EVALUATION WAS PERFORMED  Charlett Blake 01/07/2019, 8:08 AM

## 2019-01-07 NOTE — Progress Notes (Signed)
Occupational Therapy Session Note  Patient Details  Name: Robert Rowe MRN: [id number] Date of Birth: 27-Oct-1918  Today's Date: 01/07/2019 OT Individual Time: 0845-1000 Session 2: 1115-1200 OT Individual Time Calculation (min): 75 min Session 2: 45 min   Short Term Goals: Week 1:  OT Short Term Goal 1 (Week 1): STGs = LTG due to short LOS  Skilled Therapeutic Interventions/Progress Updates:    Pt received sitting up in recliner with no c/o pain. Pt completed functional mobility into bathroom with RW, min cueing for RW management/UE placement. Pt completed toilet transfer and hygiene with (S)-CGA. Pt completed UB bathing seated with (S). Closer (S) provided during standing level peri cleansing. Pt with good use of grab bars during standing. Pt transferred out of shower and back to EOB to dress. Several rest breaks required following LB dressing d/t fatigue. Pt able to don shirt with set up. Pt completed 130 ft of functional mobility with close (S) using RW. Cueing required for activity pacing. Pt sat EOM and completed several sit <> stands with cueing for UE placement. Pt completed functional reaching with cueing for releasing UE on RW to challenge dynamic standing balance. Pt returned to room and was left sitting up in recliner with chair alarm belt fastened.   Session 2:   Pt received sitting up in recliner with no c/o pain. Cueing required for UE placement as pt still wanting to place B UE on RW. Pt used RW to complete functional mobility down to therapy gym with (S). Pt required extended rest break following mobility, no change in vital signs but SOB. Pt completed sit > stand x10 from EOM with no UE support to challenge balance and quad strength. Pt then practiced retrieving items from the ground with 1 LOB with mod A provided to correct. Pt completed core stability exercises with rotation, cueing for muscle activation. Pt returned to room and was left sitting up in recliner with chair alarm belt  fastened.   Therapy Documentation Precautions:  Precautions Precautions: Fall Restrictions Weight Bearing Restrictions: No   Pain:  No pain reported    Therapy/Group: Individual Therapy  Curtis Sites 01/07/2019, 7:15 AM

## 2019-01-07 NOTE — Progress Notes (Signed)
Physical Therapy Session Note  Patient Details  Name: Robert Rowe MRN: [id number] Date of Birth: 07-Nov-1918  Today's Date: 01/07/2019 PT Individual Time: 1302-1400 and 1615-1700 PT Individual Time Calculation (min): 58 min and 45 min    Short Term Goals: Week 1:  PT Short Term Goal 1 (Week 1): =LTGs due to ELOS  Skilled Therapeutic Interventions/Progress Updates:    Session 1: Pt seated in recliner upon PT arrival, agreeable to therapy tx and denies pain. Pt transferred to standing with RW and supervision, ambulated to the dayroom x 150 ft with RW and CGA. Pt used nustep 3 x 2 min working on endurance and activity tolerance, resistance level 5. Pt ambulated back to the gym x 150 ft with RW and CGA. Pt transferred to mat. Pt worked on standing balance and heel cord stretching this session while standing on red wedge, x 1 trial static standing and x 1 trial tossing horsehoes, min assist for balance. Pt worked on standing balance on airex while completing puzzle, cues to try without UE support vs single UE support on RW. Pt worked on LE strengthening to perform 2 x 5 sit<>stands with RW, 2 x 10 LAQ and 2 x 10 calf raises with RW. Pt given handouts for HEP, will review exercises this afternoon. Pt ambulated back to room with CGA and RW x 200 ft. Pt left in recliner with needs in reach and chair alarm set. Pt's wife present, discussed follow up therapies and equipment.   Session 2: Pt seated in recliner upon PT arrival, agreeable to therapy tx and denies pain. Pt donned clean shirt with supervision. Pt ambulate to the gym with RW and supervision x 150 ft. Pt ambulated to the rehab apartment and supervision with RW x 60 ft. Pt worked on ambulating within rehab apartment without AD and min assist, performed couch and recliner transfer. Pt reports he walks without a RW at the house and just "reaches out for things." Therapist recommends using RW instead of furniture walking. Pt ambulated to the step and  ascended/descended 4 steps with B rails and 4 steps with single rail and both hands on rail, step to pattern with min assist and cues for techniques. Pt performed 2 x 10 sit<>stands without AD and CGA working on  LE strengthening. Pt ambulated back to room with RW and supervision x 200 ft. Pt left in recliner with chair alarm set and wife present.    Therapy Documentation Precautions:  Precautions Precautions: Fall Restrictions Weight Bearing Restrictions: No   Therapy/Group: Individual Therapy  Netta Corrigan, PT, DPT 01/07/2019, 7:51 AM

## 2019-01-08 ENCOUNTER — Inpatient Hospital Stay (HOSPITAL_COMMUNITY): Payer: Medicare Other | Admitting: Physical Therapy

## 2019-01-08 ENCOUNTER — Inpatient Hospital Stay (HOSPITAL_COMMUNITY): Payer: Self-pay

## 2019-01-08 ENCOUNTER — Inpatient Hospital Stay (HOSPITAL_COMMUNITY): Payer: Self-pay | Admitting: Physical Therapy

## 2019-01-08 ENCOUNTER — Inpatient Hospital Stay (HOSPITAL_COMMUNITY): Payer: Medicare Other

## 2019-01-08 NOTE — Plan of Care (Signed)
  Problem: Consults Goal: RH STROKE PATIENT EDUCATION Description See Patient Education module for education specifics  Outcome: Progressing   Problem: RH SKIN INTEGRITY Goal: RH STG SKIN FREE OF INFECTION/BREAKDOWN Description Pt will maintain skin integrity by shifting positions and maintaining continence with min assist while in rehab.   Outcome: Progressing   Problem: RH SAFETY Goal: RH STG ADHERE TO SAFETY PRECAUTIONS W/ASSISTANCE/DEVICE Description STG Adhere to Safety Precautions With Min Assistance/Device.  Outcome: Progressing   Problem: RH COGNITION-NURSING Goal: RH STG USES MEMORY AIDS/STRATEGIES W/ASSIST TO PROBLEM SOLVE Description STG Uses Memory Aids/Strategies With Min Assistance to Problem Solve.  Outcome: Progressing Goal: RH STG ANTICIPATES NEEDS/CALLS FOR ASSIST W/ASSIST/CUES Description STG Anticipates Needs/Calls for Assist With supervision Assistance/Cues.  Outcome: Progressing   Problem: RH KNOWLEDGE DEFICIT Goal: RH STG INCREASE KNOWLEDGE OF HYPERTENSION Description Pt will be able to verbalize 2 self care measures to manage blood pressure at home by the time of discharge.   Outcome: Progressing Goal: RH STG INCREASE KNOWLEDGE OF STROKE PROPHYLAXIS Description Pt will be able to verbalize 2 self care measures to decrease risk of stroke at discharge.   Outcome: Progressing

## 2019-01-08 NOTE — Progress Notes (Signed)
Occupational Therapy Session Note  Patient Details  Name: [name] MRN: [id number] Date of Birth: 29-Jun-1918  Today's Date: 01/08/2019 OT Individual Time: 0102-7253 OT Individual Time Calculation (min): 75 min    Short Term Goals: Week 1:  OT Short Term Goal 1 (Week 1): STGs = LTG due to short LOS  Skilled Therapeutic Interventions/Progress Updates:    OT intervention with focus on functional amb with RW, activity tolerance, functional transfers (bed and tub transfer bench), standing balance, and safety awareness to icnrease independence with BADLs.  Pt declined bathing/changing this morning but requested use of toilet.  Pt amb with RW to bathroom and stood at toilet to void. Supervision level.  Pt amb with RW to ADL apartment and practiced bed tranfsers and tub bench transfers with supervsiion.  Pt c/o increased dizziness with bed mobility (supine>sit >sit>supine). Pt amb with rollator to day room and BI gym for dynavision with avg reaction of 8.15 seconds. Pt takes rest breaks appropriately.  Discussed home safety and energy conservation strategies. Pt returned to room and remained in recliner with all needs within reach and belt alarm activated.    Therapy Documentation Precautions:  Precautions Precautions: Fall Restrictions Weight Bearing Restrictions: No Pain: Pain Assessment Pain Scale: 0-10 Pain Score: 0-No pain   Therapy/Group: Individual Therapy  Leroy Libman 01/08/2019, 12:14 PM

## 2019-01-08 NOTE — Evaluation (Signed)
Recreational Therapy Assessment and Plan  Patient Details  Name: Robert Rowe MRN: [id number] Date of Birth: 1918/06/30 Today's Date: 01/08/2019  Rehab Potential: Good ELOS: 2 weeks   Assessment Problem List:      Patient Active Problem List   Diagnosis Date Noted  . 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 Gait disturbance, post-stroke   . BPV (benign positional vertigo) 01/02/2019  . Acute CVA (cerebrovascular accident) ([place]) 01/01/2019  . TIA (transient ischemic attack) 12/31/2018  . CVA (cerebral vascular accident) ([place]) 12/31/2018  . Chest pain 05/15/2018  . CKD (chronic kidney disease) stage 3, GFR 30-59 ml/min (HCC) 05/08/2018  . Other fatigue 06/13/2017  . Abnormal diffusion capacity determined by pulmonary function test 06/13/2017  . Chronic diastolic CHF (congestive heart failure) ([place]) 11/01/2016  . Essential hypertension 11/01/2016  . Intracranial vascular stenosis 11/09/2015  . Mild cognitive impairment 11/09/2015  . 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 Benign prostatic hyperplasia with urinary obstruction 03/19/2015  . Awareness alteration, transient 02/26/2015  . PVC's (premature ventricular contractions) 08/22/2013  . Hypothyroidism 05/09/2013  . Stricture and stenosis of esophagus 03/12/2013  . Dysphagia, unspecified(787.20) 02/22/2013  . DOE (dyspnea on exertion) 03/23/2011  . PALPITATIONS 02/12/2010  . CHEST DISCOMFORT 04/25/2009  . PEDAL EDEMA 05/01/2008  . GERD 12/03/2007  . Dyslipidemia 04/05/2007  . BENIGN PROSTATIC HYPERTROPHY 04/05/2007  . Rheumatoid arthritis ([place]) 04/05/2007  . Osteoarthritis 04/05/2007  . History of cardiovascular disorder 04/05/2007    Past Medical History:      Past Medical History:  Diagnosis Date  . BENIGN PROSTATIC HYPERTROPHY 04/05/2007  . 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 CHEST DISCOMFORT 04/25/2009  . Chronic diastolic CHF (congestive heart failure) ([place])    a. Echo  12/16: mild LVH, EF 55-60%, no RWMA, Gr 1 DD, mild AI, MAC, mild LAE, normal RVF, PASP 37 mmHg  . Chronic kidney disease    chronic   . DJD (degenerative joint disease)   . Dysrhythmia    pvc's   . GERD 12/03/2007  . Hiatal hernia   . History of nuclear stress test    Myoview 1/17: EF 53%, normal perfusion; Low Risk  . HYPERLIPIDEMIA 04/05/2007  . Hypothyroidism   . Osteoarth NOS-Unspec 04/05/2007  . Palpitations 02/12/2010  . PEDAL EDEMA 05/01/2008  . Rheumatoid arthritis(714.0) 04/05/2007  . Shortness of breath dyspnea   . TRANSIENT ISCHEMIC ATTACK, HX OF 04/05/2007   ?    Past Surgical History:       Past Surgical History:  Procedure Laterality Date  . BALLOON DILATION N/A 03/12/2013   Procedure: BALLOON DILATION;  Surgeon: Inda Castle, MD;  Location: [facility];  Service: Endoscopy;  Laterality: N/A;  . CARPAL TUNNEL RELEASE  11/28/2018   Left hand  . CATARACT EXTRACTION  2001   bilateral  . CYSTOSCOPY WITH URETHRAL DILATATION N/A 09/24/2015   Procedure: CYSTOSCOPY WITH COOK BALLOON DILATATION OF URETHRAL STRICTURE ;  Surgeon: Carolan Clines, MD;  Location: [facility];  Service: Urology;  Laterality: N/A;  . CYSTOSCOPY WITH URETHRAL DILATATION N/A 07/20/2016   Procedure: CYSTOSCOPY WITH URETHRAL DILATATION;  Surgeon: Alexis Frock, MD;  Location: [facility];  Service: Urology;  Laterality: N/A;  1 HOUR  . ESOPHAGOGASTRODUODENOSCOPY N/A 03/12/2013   Procedure: ESOPHAGOGASTRODUODENOSCOPY (EGD);  Surgeon: Inda Castle, MD;  Location: [facility];  Service: Endoscopy;  Laterality: N/A;  . HEMORRHOID SURGERY    . HERNIA REPAIR     inguinal x5  . KNEE SURGERY  arthroscopic   left  . PROSTATE CRYOABLATION     2000  . SHOULDER SURGERY  few years ago   left  . TRANSURETHRAL RESECTION OF PROSTATE N/A 03/19/2015   Procedure: TRANSURETHRAL RESECTION OF THE PROSTATE (TURP);  Surgeon: Carolan Clines, MD;  Location: [facility];  Service: Urology;   Laterality: N/A;    Assessment & Plan Clinical Impression: Patient is a 85 year old right-handed male with history of TIA, PVCs, hypothyroidism, chronic diastolic congestive heart failure, hypertension,CKD. Presented to 12/31/2018 with vertigo as well as limited hearing to the right ear 2-3 days. Patient had made plans to see ENT. There were reports of a fall in the bathroom with no loss of consciousness. Upon arrival patient blood pressure 201/97. Patient has been followed by Dr. Delice Lesch neurology services for the past 6 months 4 episodes of syncope/BPPV as well as bilateral leg weakness and slurred speech. MRA of the head and neck showed intracranial stenosis with significant intracranial stenosis in basilar, right ICA cavernous and supraclinoid junction, moderate narrowing bilateral ACA and right ACA, distal right vertebral and bilateral PCA stenosis. Recurrent symptoms concerning for hypoperfusion causing TIA. Patient was maintained on Plavix. 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 He lives with spouse reportedly independent prior to admission he did use a rolling walker outside the home. MRI of the brain showed a 6 mm acute ischemic nonhemorrhagic infarction of the left occipital lobe. Echocardiogram with ejection fraction of 65% no evidence of left ventricular regional wall motion abnormalities. EEG was negative for seizure. Patient is currently maintained on aspirin 325 mg and Plavix for CVA prophylaxis for 3 months and then either aspirin or Plavix alone. Tolerating a regular diet. Therapy evaluations completed with recommendations of physical medicine rehabilitation consult. Patient transferred to CIR on 01/03/2019.   Pt presents with decreased activity tolerance, decreased functional mobility, decreased balance, decreased safety awareness, decreased memory Limiting pt's independence with leisure/community pursuits.   Leisure History/Participation Premorbid  leisure interest/current participation: Community - Other (Comment)([place] - going out to eat with other retire men) Leisure Participation Style: With Family/Friends Awareness of Community Resources: Good-identify 3 post discharge leisure resources Psychosocial / Spiritual Social interaction - Mood/Behavior: Cooperative Academic librarian Appropriate for Education?: Yes Patient Agreeable to [employer]?: Yes Recreational Therapy Orientation Orientation -Reviewed with patient: Available activity resources Strengths/Weaknesses Patient Strengths/Abilities: Willingness to participate Patient weaknesses: Physical limitations TR Patient demonstrates impairments in the following area(s): Endurance;Motor;Safety  Plan Rec Therapy Plan Is patient appropriate for Therapeutic Recreation?: Yes Rehab Potential: Good Treatment times per week: Min1 TR session >30 minutes for community reintegration during LOS Estimated Length of Stay: 2 weeks TR Treatment/Interventions: Adaptive equipment instruction;Patient/family education;Community reintegration;Functional mobility training  Recommendations for other services: None   Discharge Criteria: Patient will be discharged from TR if patient refuses treatment 3 consecutive times without medical reason.  If treatment goals not met, if there is a change in medical status, if patient makes no progress towards goals or if patient is discharged from hospital.  The above assessment, treatment plan, treatment alternatives and goals were discussed and mutually agreed upon: by patient   Met with pt today to discuss the above with specific emphasis on community reintegration per team referral.  Discussed the purpose of an outing and potential goals.  Pt agreeable to participate in an outing to be scheduled tomorrow.  Lewis 01/08/2019, 2:34 PM

## 2019-01-08 NOTE — Progress Notes (Signed)
[facility] PHYSICAL MEDICINE & REHABILITATION PROGRESS NOTE   Subjective/Complaints:  Still needs assist on steps, has 6 steps to enter  ROS: Patient denies fever nausea, vomiting, diarrhea, cough, shortness of breath or chest pain,   Objective:   No results found. No results for input(s): WBC, HGB, HCT, PLT in the last 72 hours. No results for input(s): NA, K, CL, CO2, GLUCOSE, BUN, CREATININE, CALCIUM in the last 72 hours.  Intake/Output Summary (Last 24 hours) at 01/08/2019 0830 Last data filed at 01/07/2019 1825 Gross per 24 hour  Intake 240 ml  Output -  Net 240 ml     Physical Exam: Vital Signs Blood pressure 129/77, pulse 83, temperature 98.3 F (36.8 C), temperature source Oral, resp. rate 16, height 5\' 2"  (1.575 m), weight 64.6 kg, SpO2 96 %.   Constitutional: No distress . Vital signs reviewed. HEENT: EOMI, oral membranes moist Neck: supple Cardiovascular: RRR without murmur. No JVD    Respiratory: CTA Bilaterally without wheezes or rales. Normal effort    GI: BS +, non-tender, non-distended  Extremities: No clubbing, cyanosis, or edema Skin: No evidence of breakdown, no evidence of rash Neurologic: Cranial nerves II through XII intact, motor strength is 5/5 in bilateral deltoid, bicep, tricep, grip, hip flexor, knee extensors, ankle dorsiflexor and plantar flexor   Musculoskeletal: Full range of motion in all 4 extremities. No joint swelling   Assessment/Plan: 1. Functional deficits secondary to Left occipital infarct  which require 3+ hours per day of interdisciplinary therapy in a comprehensive inpatient rehab setting.  Physiatrist is providing close team supervision and 24 hour management of active medical problems listed below.  Physiatrist and rehab team continue to assess barriers to discharge/monitor patient progress toward functional and medical goals  Care Tool:  Bathing    Body parts bathed by patient: Right arm, Left arm, Chest, Abdomen,  Front perineal area, Buttocks, Right upper leg, Left upper leg, Right lower leg, Left lower leg, Face         Bathing assist Assist Level: Contact Guard/Touching assist     Upper Body Dressing/Undressing Upper body dressing   What is the patient wearing?: Hospital gown only    Upper body assist Assist Level: Set up assist    Lower Body Dressing/Undressing Lower body dressing      What is the patient wearing?: Hospital gown only     Lower body assist Assist for lower body dressing: Supervision/Verbal cueing     Toileting Toileting    Toileting assist Assist for toileting: Supervision/Verbal cueing Assistive Device Comment: urinal   Transfers Chair/bed transfer  Transfers assist     Chair/bed transfer assist level: Contact Guard/Touching assist     Locomotion Ambulation   Ambulation assist      Assist level: Contact Guard/Touching assist Assistive device: Walker-rolling Max distance: 150'   Walk 10 feet activity   Assist     Assist level: Contact Guard/Touching assist Assistive device: Walker-rolling   Walk 50 feet activity   Assist    Assist level: Contact Guard/Touching assist Assistive device: Walker-rolling    Walk 150 feet activity   Assist    Assist level: Contact Guard/Touching assist Assistive device: Walker-rolling    Walk 10 feet on uneven surface  activity   Assist Walk 10 feet on uneven surfaces activity did not occur: Safety/medical concerns         Wheelchair     Assist Will patient use wheelchair at discharge?: No   Wheelchair activity did not occur:  N/A         Wheelchair 50 feet with 2 turns activity    Assist    Wheelchair 50 feet with 2 turns activity did not occur: N/A       Wheelchair 150 feet activity     Assist Wheelchair 150 feet activity did not occur: N/A         Medical Problem List and Plan: 1.  Gait instability with syncope secondary to left occipital infarction,  likely vertebrobasilar insufficiency  CIR PT, OT tem conf in am , ? D/C THurs or Fri  -  low dose meclizine as needed for dizziness  2.  DVT Prophylaxis/Anticoagulation: SCDs. Monitor for any signs of DVT- no calf pain or swelling  3. Pain Management:  Tylenol as needed 4. Mood:  Provide emotional support 5. Neuropsych: This patient is capable of making decisions on his own behalf. 6. Skin/Wound Care: routine skin checks 7. Fluids/Electrolytes/Nutrition:  Routine in and out's with follow-up chemistries, monitor meal intake 8.  Chronic diastolic congestive heart failure. Monitor for any signs of fluid overload. Patient on Lasix 20-40 mg as needed prior to admission. Resume as needed No pedal edema  -Daily weights appear stable 2/17-Not on lasix but fluid intake also low ~334ml per day, pt will try to increase intake Filed Weights   01/06/19 0611 01/07/19 0533 01/08/19 0500  Weight: 63.5 kg 64.2 kg 64.6 kg    9. CKD. Creatinine baseline 1.56-1.70 10. Hypertension. Toprol-XL 25 mg daily, monitor vitals q shift Vitals:   01/07/19 1939 01/08/19 0555  BP: 122/69 129/77  Pulse: 83 83  Resp: 16 16  Temp: (!) 97.3 F (36.3 C) 98.3 F (36.8 C)  SpO2: 97% 96%  BPs controlled 2/18 11. Hypothyroidism.  TSH 2.124. Continue Synthroid 12. Hyperlipidemia. Lipitor 13. N/V: ?chronic.   -Pt is eating well and moving bowels  -monitor for now    LOS: 5 days A FACE TO FACE EVALUATION WAS PERFORMED  Charlett Blake 01/08/2019, 8:30 AM

## 2019-01-08 NOTE — Progress Notes (Signed)
Physical Therapy Session Note  Patient Details  Name: Robert Rowe MRN: [id number] Date of Birth: 12/24/1917  Today's Date: 01/08/2019 PT Individual Time: 1100-1200 PT Individual Time Calculation (min): 60 min   Short Term Goals: Week 1:  PT Short Term Goal 1 (Week 1): =LTGs due to ELOS  Skilled Therapeutic Interventions/Progress Updates:    Pt seated in recliner upon PT arrival, agreeable to therapy tx and denies pain. Pt and therapist discussed going on a community outing tomorrow in order to work on community distance ambulation. Pt ambulated to the gym x 150 ft with RW and supervision. Pt worked on dynamic standing balance without UE support to perform toe taps on 4 inch step, x 2 trials with min assist. Pt worked on standing balance while standing on airex without UE support to toss horseshoes x 2 trials with min assist. Pt performed LE strengthening exercises this session including 2 x 10 sit<>stands without UE support, 2 x 10 standing calf raises and 2 x 10 hip abduction with theraband resistance. Pt worked on standing balance and heel cord/hamstring stretching while standing on red wedge without UE support, x 2 trials with CGA for balance. Therapist performed hamstring stretching with pt sitting edge of mat, 2 x 30 sec bilaterally. Pt ambulated back to room with RW and supervision, left in recliner with needs in reach and chair alarm set.   Therapy Documentation Precautions:  Precautions Precautions: Fall Restrictions Weight Bearing Restrictions: No    Therapy/Group: Individual Therapy  Netta Corrigan, PT, DPT 01/08/2019, 7:59 AM

## 2019-01-08 NOTE — Progress Notes (Addendum)
Physical Therapy Session Note  Patient Details  Name: Robert Rowe MRN: [id number] Date of Birth: 12-09-1917  Today's Date: 01/08/2019 PT Individual Time: 1300-1400 AND 1630-1700 PT Individual Time Calculation (min): 60 min AND 30 min  Short Term Goals: Week 1:  PT Short Term Goal 1 (Week 1): =LTGs due to ELOS  Skilled Therapeutic Interventions/Progress Updates:   Session 1:  Pt in recliner and agreeable to therapy, no c/o pain. Pt ambulated to/from therapy gym w/ supervision using RW. Session focused on household and community mobility and d/c planning. Discussed importance of using RW w/ all mobility upon d/c, including household mobility. Practiced floor transfer, min assist, and educated on needing to call EMS should he fall at home given his age. Practiced negotiating steps w/ unilateral rail as per home access, ramp, curb w/ RW, and uneven surface. Close supervision for both w/ verbal cues for technique and safety. Pt states his wife already stands with him on the stairs and discussed having her join him when entering and exiting the restaurants where he meets his friends. Pt verbalized understanding and in agreement w/ all education. Returned to room and educated wife on recommendations at d/c including 24/7 supervision and close supervision-CGA w/ stair negotiation and community mobility. Wife in agreement. Ended session in recliner, all needs in reach.   Session 2:  Pt in recliner and agreeable to therapy, no c/o pain. Ambulated to/from therapy gym w/ supervision using RW. NuStep 3 min x2 @ level 3 for global strengthening and endurance training. Verbal reminders throughout session to slow movement, pt tends to ambulate or move quickly and fatigues faster. Educated pt on energy conservation techniques to use upon d/c for safety. Pt verbalized understanding and in agreement w/ recommendations. Returned to room and ended session in recliner, all needs in reach.   Therapy  Documentation Precautions:  Precautions Precautions: Fall Restrictions Weight Bearing Restrictions: No  Therapy/Group: Individual Therapy  Nilaya Bouie K Dyanna Seiter 01/08/2019, 2:15 PM

## 2019-01-09 ENCOUNTER — Encounter (HOSPITAL_COMMUNITY): Payer: Medicare Other | Admitting: Occupational Therapy

## 2019-01-09 ENCOUNTER — Inpatient Hospital Stay (HOSPITAL_COMMUNITY): Payer: Medicare Other

## 2019-01-09 ENCOUNTER — Inpatient Hospital Stay (HOSPITAL_COMMUNITY): Payer: Self-pay

## 2019-01-09 NOTE — Progress Notes (Signed)
Occupational Therapy Session Note  Patient Details  Name: Robert Rowe MRN: [id number] Date of Birth: November 05, 1918  Today's Date: 01/09/2019 OT Individual Time: 1300-1358 OT Individual Time Calculation (min): 71 min    Short Term Goals: Week 1:  OT Short Term Goal 1 (Week 1): STGs = LTG due to short LOS  Skilled Therapeutic Interventions/Progress Updates:    OT intervention with focus on BADL retraining, functional amb with RW, standing balance, energy conservation, activity tolerance, safety awareness, and education to increase independence with BADLs and prepare for discharge.  Pt's wife present for first part of therapy session.  Pt requested to use toilet and amb with RW to bathroom.  Pt required supervision for all toileting tasks and changing pants/pull up.  A reacher was demonstrated to use for assistance with dressing.  Pt stated he would purchase one after discharge.  Pt amb with RW to ADL apartment with one rest break.  Discussed energy conversation strategies.  Pt repeatedly commented that he was surprised how fatigued he was after earlier outing to [employer]. Pt returned to room without rest break at a relaxed pace.  Pt stated he could tell a difference when he didn't walk too fast.  Pt remained in recliner with belt alarm activated and wife present.   Therapy Documentation Precautions:  Precautions Precautions: Fall Restrictions Weight Bearing Restrictions: No   Pain: Pain Assessment Pain Scale: Faces Pain Score: 0-No pain   Therapy/Group: Individual Therapy  Leroy Libman 01/09/2019, 2:00 PM

## 2019-01-09 NOTE — Progress Notes (Signed)
Recreational Therapy Session Note  Patient Details  Name: Robert Rowe MRN: [id number] Date of Birth: [birth year] Today's Date: 01/09/2019 Time:  1030-1230 Pain: no c/o  Skilled Therapeutic Interventions/Progress Updates: Pt participated in community reintegration/outing to [employer] at overall close supervision-min assist ambulatory level using RW.  Pt required min assist for community mobility due to poor safety awareness with RW.  Pt would push walker too far out in front of him while walking and continually got walker caught up in slip rugs needing steadying assistance.  Pt required verbal cueing for safety as he would leave walker and reach outside base of support during toileting.  Goals focused on safe community mobility, identification & negotiation of obstacles, accessing public restroom, energy conservation techniques/education.  See outing goal sheet in shadow chart for full details.   Therapy/Group: Robert Rowe  Robert Rowe 01/09/2019, 4:01 PM

## 2019-01-09 NOTE — Progress Notes (Signed)
Occupational Therapy Discharge Summary  Patient Details  Name: Milen S Nichols MRN: [id number] Date of Birth: 01/23/1918  Patient has met 8 of 8 long term goals due to improved activity tolerance, improved balance, postural control, ability to compensate for deficits, improved attention, improved awareness and improved coordination.  Pt made steady progress with BADLs and is discharging home at supervision level.  Pt continues to fatigue quickly but takes rest breaks appropriately.  Pt educated on energy conservation strategies.  AE introduced to simplify BADLs.  Pt stated he would purchase equipment after discharge.  Pt's wife has been present for therapy.  Patient to discharge at overall Supervision level.  Patient's care partner is independent to provide the necessary physical and cognitive assistance at discharge.    Recommendation:  Patient will benefit from ongoing skilled OT services in home health setting to continue to advance functional skills in the area of BADL, iADL and Reduce care partner burden.  Equipment: tub transfer bench  Reasons for discharge: treatment goals met and discharge from hospital  Patient/family agrees with progress made and goals achieved: Yes  OT Discharge Precautions/Restrictions  Precautions Precautions: Fall Restrictions Weight Bearing Restrictions: No Vision Baseline Vision/History: Wears glasses Wears Glasses: Reading only Patient Visual Report: No change from baseline Vision Assessment?: No apparent visual deficits Perception  Perception: Within Functional Limits Praxis Praxis: Intact Cognition Overall Cognitive Status: History of cognitive impairments - at baseline Arousal/Alertness: Awake/alert Orientation Level: Oriented X4 Attention: Sustained Focused Attention: Appears intact Memory: Impaired Memory Impairment: Decreased short term memory;Decreased recall of new information Awareness: Appears intact Problem Solving: Appears  intact Safety/Judgment: Appears intact Sensation Sensation Light Touch: Appears Intact Hot/Cold: Appears Intact Proprioception: Appears Intact Stereognosis: Not tested Coordination Gross Motor Movements are Fluid and Coordinated: Yes Fine Motor Movements are Fluid and Coordinated: Yes Motor  Motor Motor: Within Functional Limits Motor - Skilled Clinical Observations: generalized weakness    Trunk/Postural Assessment  Cervical Assessment Cervical Assessment: Exceptions to WFL(forward head posture) Thoracic Assessment Thoracic Assessment: Exceptions to WFL(rounded shoulders) Lumbar Assessment Lumbar Assessment: Exceptions to WFL(posterior pelvic tilt, flexed at trunk ))  Balance Static Sitting Balance Static Sitting - Balance Support: Feet supported;No upper extremity supported Static Sitting - Level of Assistance: 5: Stand by assistance Dynamic Sitting Balance Dynamic Sitting - Balance Support: No upper extremity supported;Feet supported Dynamic Sitting - Level of Assistance: 5: Stand by assistance Extremity/Trunk Assessment RUE Assessment RUE Assessment: Within Functional Limits LUE Assessment LUE Assessment: Within Functional Limits   Lanier, Thomas Chappell 01/09/2019, 1:58 PM  

## 2019-01-09 NOTE — Progress Notes (Signed)
Social Work Patient ID: Robert Rowe, male   DOB: 11-Aug-1918, 85 y.o.   MRN: [id number] Met with pt and wife to discuss team conference goals supervision level and discharge tomorrow. Have given wife private duty list of agencies in case she wants to hire some assist for when she can not be there ewith pt. Discussed home health and equipment which will be delivered tomorrow. Will see in am for any last minute questions.

## 2019-01-09 NOTE — Patient Care Conference (Signed)
Inpatient RehabilitationTeam Conference and Plan of Care Update Date: 01/09/2019   Time: 11:10 AM    Patient Name: Robert Rowe      Medical Record Number: [id number]  Date of Birth: 12-24-1917 Sex: Male         Room/Bed: 4W24C/4W24C-01 Payor Info: Payor: [employer] / Plan: [employer] MEDICARE / Product Type: *No Product type* /    Admitting Diagnosis: r cva  Admit Date/Time:  01/03/2019  1:22 PM Admission Comments: No comment available   Primary Diagnosis:  <principal problem not specified> Principal Problem: <principal problem not specified>  Patient Active Problem List   Diagnosis Date Noted  . 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 Gait disturbance, post-stroke   . BPV (benign positional vertigo) 01/02/2019  . Acute CVA (cerebrovascular accident) ([place]) 01/01/2019  . TIA (transient ischemic attack) 12/31/2018  . CVA (cerebral vascular accident) ([place]) 12/31/2018  . Chest pain 05/15/2018  . CKD (chronic kidney disease) stage 3, GFR 30-59 ml/min ([facility]) 05/08/2018  . Other fatigue 06/13/2017  . Abnormal diffusion capacity determined by pulmonary function test 06/13/2017  . Chronic diastolic CHF (congestive heart failure) ([place]) 11/01/2016  . Essential hypertension 11/01/2016  . Intracranial vascular stenosis 11/09/2015  . Mild cognitive impairment 11/09/2015  . 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 Benign prostatic hyperplasia with urinary obstruction 03/19/2015  . Awareness alteration, transient 02/26/2015  . PVC's (premature ventricular contractions) 08/22/2013  . Hypothyroidism 05/09/2013  . Stricture and stenosis of esophagus 03/12/2013  . Dysphagia, unspecified(787.20) 02/22/2013  . DOE (dyspnea on exertion) 03/23/2011  . PALPITATIONS 02/12/2010  . CHEST DISCOMFORT 04/25/2009  . PEDAL EDEMA 05/01/2008  . GERD 12/03/2007  . Dyslipidemia 04/05/2007  . BENIGN PROSTATIC HYPERTROPHY 04/05/2007  . Rheumatoid arthritis ([place]) 04/05/2007  . Osteoarthritis  04/05/2007  . History of cardiovascular disorder 04/05/2007    Expected Discharge Date: Expected Discharge Date: 01/10/19  Team Members Present: Physician leading conference: Dr. Alysia Penna Social Worker Present: Ovidio Kin, LCSW Nurse Present: Dorthula Nettles, RN PT Present: Michaelene Song, PT OT Present: Roanna Epley, COTA SLP Present: Weston Anna, SLP PPS Coordinator present : Gunnar Fusi     Current Status/Progress Goal Weekly Team Focus  Medical   Gait imbalance, safety risk  Improve balance reduce recurrent stroke risk  Discharge planning   Bowel/Bladder   Cont x2 LBM 2/18  continue to be continent  assess bowel and bladder needs qshift and PRN   Swallow/Nutrition/ Hydration             ADL's   supervision/CGA overall; limited activity tolerance  supervision overall  discharge planning, activity tolerance, safety awareness   Mobility   CGA w/o RW, supervision overall w/ RW, close supervision for dynamic balance and stairs   24/7 supervision  discharge planning, global balance and endurance, functional balance   Communication             Safety/Cognition/ Behavioral Observations            Pain   No c/o pain PRN tylenol ordered   Pain less than 3  assess pain management qshift and PRN   Skin   No skin issues  remian free of skin impairments  assess skin qshift and PRN      *See Care Plan and progress notes for long and short-term goals.     Barriers to Discharge  Current Status/Progress Possible Resolutions Date Resolved   Physician    Medical stability     Progressing with therapy  Plan Home  this week transition with home health therapies      Nursing                  PT                    OT                  SLP                SW                Discharge Planning/Teaching Needs:  Home with wife who can provdie supervision level, pt moves very well. Ready to go home by the end of the week      Team Discussion:  Goals supervision  level and and meeting those goals. Outing today and working on endurance and fatigue level. Requires cues at times. No Speech needed. Medically stable for DC tomorrow.  Revisions to Treatment Plan:  DC 2/20    Continued Need for Acute Rehabilitation Level of Care: The patient requires daily medical management by a physician with specialized training in physical medicine and rehabilitation for the following conditions: Daily direction of a multidisciplinary physical rehabilitation program to ensure safe treatment while eliciting the highest outcome that is of practical value to the patient.: Yes Daily medical management of patient stability for increased activity during participation in an intensive rehabilitation regime.: Yes Daily analysis of laboratory values and/or radiology reports with any subsequent need for medication adjustment of medical intervention for : Neurological problems   I attest that I was present, lead the team conference, and concur with the assessment and plan of the team.   Elease Hashimoto 01/09/2019, 12:31 PM

## 2019-01-09 NOTE — Progress Notes (Signed)
Physical Therapy Discharge Summary  Patient Details  Name: Robert Rowe MRN: [id number] Date of Birth: [birth year]  Today's Date: 01/09/2019 PT Individual Time: 7622-6333 PT Individual Time Calculation (min): 59 min    Patient has met 9 of 9 long term goals due to improved activity tolerance, improved balance, improved postural control and increased strength.  Patient to discharge at an ambulatory level Supervision.   Patient's care partner is independent to provide the necessary cognitive assistance at discharge. Pt's wife will provide supervision upon d/c.   All goals met.   Recommendation:  Patient will benefit from ongoing skilled PT services in home health setting to continue to advance safe functional mobility, address ongoing impairments in balance, strength, and minimize fall risk.  Equipment: No equipment provided  Reasons for discharge: treatment goals met and discharge from hospital  Patient/family agrees with progress made and goals achieved: Yes   PT Treatment Interventions: Pt seated in recliner upon PT arrival, agreeable to therapy tx and denies pain. Pt ambulated to the dayroom and used nustep x 65mnutes for global strengthening and endurance. Pt ambulated x 150 ft to the gym with RW and supervision. Pt ascended/descended 4 steps x 2 this session with supervision and verbal cues for techniques using B hand support on single rail, step to pattern. Pt ambulated to apartment with supervision and performed bed transfer/couch transfer and bed mobility with supervision this session, RW for transfers. Pt ambulated to the ortho gym with RW and supervision x 100 ft and performed car transfer with supervision. Pt ambulated 2 x 150 ft back to room with RW and supervision, left in recliner with needs in reach. Therapist educated wife on techniques for going up/down steps with rail and for car transfer techniques.   PT Discharge Precautions/Restrictions Precautions Precautions:  Fall Restrictions Weight Bearing Restrictions: No Vital Signs Therapy Vitals Temp: (!) 97.5 F (36.4 C) Temp Source: Oral Pulse Rate: 79 Resp: 16 BP: (!) 89/57 Patient Position (if appropriate): Sitting Oxygen Therapy SpO2: 97 % O2 Device: Room Air Pain Pain Assessment Pain Scale: Faces Pain Score: 0-No pain Vision/Perception  Perception Perception: Within Functional Limits Praxis Praxis: Intact  Cognition Overall Cognitive Status: History of cognitive impairments - at baseline Arousal/Alertness: Awake/alert Orientation Level: Oriented X4 Attention: Sustained Focused Attention: Appears intact Memory: Impaired Memory Impairment: Decreased short term memory;Decreased recall of new information Awareness: Appears intact Problem Solving: Appears intact Safety/Judgment: Appears intact Sensation Sensation Light Touch: Appears Intact Hot/Cold: Appears Intact Proprioception: Appears Intact Stereognosis: Not tested Coordination Gross Motor Movements are Fluid and Coordinated: Yes Fine Motor Movements are Fluid and Coordinated: Yes Motor  Motor Motor: Within Functional Limits Motor - Skilled Clinical Observations: generalized weakness  Mobility Bed Mobility Bed Mobility: Rolling Right;Rolling Left;Supine to Sit;Sit to Supine Rolling Right: Supervision/verbal cueing Rolling Left: Supervision/Verbal cueing Supine to Sit: Supervision/Verbal cueing Sit to Supine: Supervision/Verbal cueing Transfers Transfers: Sit to Stand;Stand to Sit;Stand Pivot Transfers Sit to Stand: Supervision/Verbal cueing Stand to Sit: Supervision/Verbal cueing Stand Pivot Transfers: Supervision/Verbal cueing Transfer (Assistive device): Rolling walker Locomotion  Gait Ambulation: Yes Gait Assistance: Supervision/Verbal cueing Gait Distance (Feet): 150 Feet Assistive device: Rolling walker Gait Gait: Yes Gait Pattern: Impaired Gait Pattern: Shuffle;Poor foot clearance - right;Poor foot  clearance - left;Trunk flexed Gait velocity: decreased Stairs / Additional Locomotion Stairs: Yes Stairs Assistance: Supervision/Verbal cueing Stair Management Technique: One rail Right Number of Stairs: 4 Height of Stairs: 6 Wheelchair Mobility Wheelchair Mobility: No  Trunk/Postural Assessment  Cervical Assessment Cervical Assessment: Exceptions to  WFL(forward head posture) Thoracic Assessment Thoracic Assessment: Exceptions to WFL(kyphotic) Lumbar Assessment Lumbar Assessment: Exceptions to WFL(posterior pelvic tilt) Postural Control Postural Control: Within Functional Limits  Balance Static Sitting Balance Static Sitting - Balance Support: Feet supported;No upper extremity supported Static Sitting - Level of Assistance: 5: Stand by assistance Dynamic Sitting Balance Dynamic Sitting - Balance Support: No upper extremity supported;Feet supported Dynamic Sitting - Level of Assistance: 5: Stand by assistance Extremity Assessment  RUE Assessment RUE Assessment: Within Functional Limits LUE Assessment LUE Assessment: Within Functional Limits RLE Assessment RLE Assessment: Exceptions to [facility] Passive Range of Motion (PROM) Comments: WFL General Strength Comments: globally 3+ to 4/5  LLE Assessment LLE Assessment: Exceptions to [facility] Passive Range of Motion (PROM) Comments: [facility] General Strength Comments: Globally 3+ to 4/5     Netta Corrigan, PT, DPT 01/09/2019, 2:47 PM

## 2019-01-09 NOTE — Progress Notes (Addendum)
Occupational Therapy Session Note  Patient Details  Name: Robert Rowe MRN: [id number] Date of Birth: Apr 14, 1918  Today's Date: 01/09/2019 OT Concurrent [phone] 120 mins      Short Term Goals: Week 1:  OT Short Term Goal 1 (Week 1): STGs = LTG due to short LOS  Skilled Therapeutic Interventions/Progress Updates:    Community integration session with OT/TR.  Pt worked on functional mobility and transfers with use of the RW in a restaurant.  Min assist for mobility with min instructional cueing for correct walker usage as pt would leave walker to the side and use furniture or surfaces for support when attempting to balance and transfer to the chair.  Min guard assist to toilet in standing X1 as well as close supervision for standing at the sink and washing his hands.  Discussed energy conservation strategies as well as environmental barriers that were seen while out in the community.  Discussed ways to work around them and make outings easier.  Finished session with return to the room and pt left up in the wheelchair with call button and phone in reach.    Therapy Documentation Precautions:  Precautions Precautions: Fall Restrictions Weight Bearing Restrictions: No   Pain: Pain Assessment Pain Scale: Faces Pain Score: 0-No pain ADL: See Care Tool for some details of ADL  Therapy/Group: concurrent  Pansy Ostrovsky OTR/L 01/09/2019, 1:23 PM

## 2019-01-09 NOTE — Discharge Instructions (Signed)
Inpatient Rehab Discharge Instructions  JAIRON RIPBERGER Discharge date and time: No discharge date for patient encounter.   Activities/Precautions/ Functional Status: Activity: activity as tolerated Diet: regular diet Wound Care: none needed Functional status:  ___ No restrictions     ___ Walk up steps independently ___ 24/7 supervision/assistance   ___ Walk up steps with assistance ___ Intermittent supervision/assistance  ___ Bathe/dress independently ___ Walk with walker     _x__ Bathe/dress with assistance ___ Walk Independently    ___ Shower independently ___ Walk with assistance    ___ Shower with assistance ___ No alcohol     ___ Return to work/school ________  Special Instructions: No driving  Continue aspirin and Plavix 3 months then Plavix alone    COMMUNITY REFERRALS UPON DISCHARGE:    Home Health:   PT, OT, [place]   Date of last service:01/10/2019  Medical Equipment/Items [place]   [phone]  Other:PRIVATE DUTY AGENCY LIST IN CASE WANTS TO FOLLOW UP WITH  GENERAL COMMUNITY RESOURCES FOR PATIENT/FAMILY: Support Groups:[facility] SUPPORT GROUP THE SECOND Thursday @ 6:00-7:00 PM ON THE REHAB UNIT QUESTIONS CALL AMY [phone]   My questions have been answered and I understand these instructions. I will adhere to these goals and the provided educational materials after my discharge from the hospital.  Patient/Caregiver Signature _______________________________ Date __________  Clinician Signature _______________________________________ Date __________  Please bring this form and your medication list with you to all your follow-up doctor's appointments.

## 2019-01-09 NOTE — Progress Notes (Signed)
Recreational Therapy Discharge Summary Patient Details  Name: Robert Rowe MRN: [id number] Date of Birth: Feb 28, 1918 Today's Date: 01/09/2019  Long term goals set: 1  Long term goals met: 1  Comments on progress toward goals: Pt has made good progress during LOS and is ready for discharge home with wife at overall supervision ambulatory level.  TR session focused on Community Reintegration at ambulatory level in which pt required close supervision-min assist and verbal cues for mobility.  Education provided to wife on safety concerns during community pursuits and stated understanding.  TR goal met.  Reasons for discharge: discharge from hospital  Patient/family agrees with progress made and goals achieved: Yes  [name] 01/09/2019, 4:07 PM

## 2019-01-09 NOTE — Progress Notes (Signed)
[facility] PHYSICAL MEDICINE & REHABILITATION PROGRESS NOTE   Subjective/Complaints: Discussed "feeling down" THinking about "how is it going to end" No new issues overnite  ROS: Patient denies fever nausea, vomiting, diarrhea, cough, shortness of breath or chest pain,   Objective:   No results found. No results for input(s): WBC, HGB, HCT, PLT in the last 72 hours. No results for input(s): NA, K, CL, CO2, GLUCOSE, BUN, CREATININE, CALCIUM in the last 72 hours.  Intake/Output Summary (Last 24 hours) at 01/09/2019 0821 Last data filed at 01/09/2019 0500 Gross per 24 hour  Intake 500 ml  Output 600 ml  Net -100 ml     Physical Exam: Vital Signs Blood pressure 121/76, pulse 80, temperature 98 F (36.7 C), temperature source Oral, resp. rate 16, height 5' 2"  (1.575 m), weight 64.6 kg, SpO2 96 %.   Constitutional: No distress . Vital signs reviewed. HEENT: EOMI, oral membranes moist Neck: supple Cardiovascular: RRR without murmur. No JVD    Respiratory: CTA Bilaterally without wheezes or rales. Normal effort    GI: BS +, non-tender, non-distended  Extremities: No clubbing, cyanosis, or edema Skin: No evidence of breakdown, no evidence of rash Neurologic: Cranial nerves II through XII intact, motor strength is 5/5 in bilateral deltoid, bicep, tricep, grip, hip flexor, knee extensors, ankle dorsiflexor and plantar flexor   Musculoskeletal: Full range of motion in all 4 extremities. No joint swelling   Assessment/Plan: 1. Functional deficits secondary to Left occipital infarct  which require 3+ hours per day of interdisciplinary therapy in a comprehensive inpatient rehab setting.  Physiatrist is providing close team supervision and 24 hour management of active medical problems listed below.  Physiatrist and rehab team continue to assess barriers to discharge/monitor patient progress toward functional and medical goals  Care Tool:  Bathing    Body parts bathed by patient:  Right arm, Left arm, Chest, Abdomen, Front perineal area, Buttocks, Right upper leg, Left upper leg, Right lower leg, Left lower leg, Face         Bathing assist Assist Level: Contact Guard/Touching assist     Upper Body Dressing/Undressing Upper body dressing   What is the patient wearing?: Hospital gown only    Upper body assist Assist Level: Set up assist    Lower Body Dressing/Undressing Lower body dressing      What is the patient wearing?: Hospital gown only     Lower body assist Assist for lower body dressing: Supervision/Verbal cueing     Toileting Toileting    Toileting assist Assist for toileting: Supervision/Verbal cueing Assistive Device Comment: urinal   Transfers Chair/bed transfer  Transfers assist     Chair/bed transfer assist level: Supervision/Verbal cueing     Locomotion Ambulation   Ambulation assist      Assist level: Supervision/Verbal cueing Assistive device: Walker-rolling Max distance: 150'   Walk 10 feet activity   Assist     Assist level: Supervision/Verbal cueing Assistive device: Walker-rolling   Walk 50 feet activity   Assist    Assist level: Supervision/Verbal cueing Assistive device: Walker-rolling    Walk 150 feet activity   Assist    Assist level: Supervision/Verbal cueing Assistive device: Walker-rolling    Walk 10 feet on uneven surface  activity   Assist Walk 10 feet on uneven surfaces activity did not occur: Safety/medical concerns         Wheelchair     Assist Will patient use wheelchair at discharge?: No   Wheelchair activity did not occur:  N/A         Wheelchair 50 feet with 2 turns activity    Assist    Wheelchair 50 feet with 2 turns activity did not occur: N/A       Wheelchair 150 feet activity     Assist Wheelchair 150 feet activity did not occur: N/A         Medical Problem List and Plan: 1.  Gait instability with syncope secondary to left  occipital infarction, likely vertebrobasilar insufficiency  CIR PT, OT , ? D/C THurs or Fri  - Team conference today please see physician documentation under team conference tab, met with team face-to-face to discuss problems,progress, and goals. Formulized individual treatment plan based on medical history, underlying problem and comorbidities. 2.  DVT Prophylaxis/Anticoagulation: SCDs. Monitor for any signs of DVT- no calf pain or swelling  3. Pain Management:  Tylenol as needed 4. Mood:  Provide emotional support 5. Neuropsych: This patient is capable of making decisions on his own behalf. 6. Skin/Wound Care: routine skin checks 7. Fluids/Electrolytes/Nutrition:  Routine in and out's with follow-up chemistries, monitor meal intake 8.  Chronic diastolic congestive heart failure. Monitor for any signs of fluid overload. Patient on Lasix 20-40 mg as needed prior to admission. Resume as needed No pedal edema  -Daily weights appear stable 2/17-Not on lasix but fluid intake also low ~348m per day, pt will try to increase intake, up to 5023myesterday Filed Weights   01/07/19 0533 01/08/19 0500 01/09/19 0609  Weight: 64.2 kg 64.6 kg 64.6 kg    9. CKD. Creatinine baseline 1.56-1.70 10. Hypertension. Toprol-XL 25 mg daily, monitor vitals q shift Vitals:   01/08/19 1935 01/09/19 0609  BP: (!) 122/55 121/76  Pulse: 87 80  Resp: 16 16  Temp: 97.6 F (36.4 C) 98 F (36.7 C)  SpO2: 91% 96%  BPs controlled 2/19 11. Hypothyroidism.  TSH 2.124. Continue Synthroid 12. Hyperlipidemia. Lipitor     LOS: 6 days A FACE TO FACE EVALUATION WAS PERFORMED  AnCharlett Blake/19/2020, 8:21 AM

## 2019-01-10 ENCOUNTER — Inpatient Hospital Stay (HOSPITAL_COMMUNITY): Payer: Medicare Other

## 2019-01-10 DIAGNOSIS — I69398 Other sequelae of cerebral infarction: Principal | ICD-10-CM

## 2019-01-10 DIAGNOSIS — R269 Unspecified abnormalities of gait and mobility: Secondary | ICD-10-CM

## 2019-01-10 MED ORDER — CLOPIDOGREL BISULFATE 75 MG PO TABS: 75.0000 mg | ORAL_TABLET | Freq: Every day | ORAL | 1 refills | Status: DC

## 2019-01-10 MED ORDER — LEVOTHYROXINE SODIUM 50 MCG PO TABS: ORAL_TABLET | ORAL | 2 refills | Status: DC

## 2019-01-10 MED ORDER — ACETAMINOPHEN 325 MG PO TABS: 650.0000 mg | ORAL_TABLET | Freq: Four times a day (QID) | ORAL | Status: DC | PRN

## 2019-01-10 MED ORDER — PANTOPRAZOLE SODIUM 40 MG PO TBEC: 40.0000 mg | DELAYED_RELEASE_TABLET | Freq: Every day | ORAL | 1 refills | Status: DC

## 2019-01-10 MED ORDER — MECLIZINE HCL 12.5 MG PO TABS: 12.5000 mg | ORAL_TABLET | Freq: Three times a day (TID) | ORAL | 0 refills | Status: AC | PRN

## 2019-01-10 MED ORDER — ATORVASTATIN CALCIUM 80 MG PO TABS: 80.0000 mg | ORAL_TABLET | Freq: Every day | ORAL | 0 refills | Status: DC

## 2019-01-10 MED ORDER — METOPROLOL SUCCINATE ER 25 MG PO TB24: 25.0000 mg | ORAL_TABLET | Freq: Every day | ORAL | 1 refills | Status: DC

## 2019-01-10 NOTE — Progress Notes (Signed)
[facility] PHYSICAL MEDICINE & REHABILITATION PROGRESS NOTE   Subjective/Complaints: Appreciate rec tx note, ready for d/c  ROS: Patient denies fever nausea, vomiting, diarrhea, cough, shortness of breath or chest pain,   Objective:   No results found. No results for input(s): WBC, HGB, HCT, PLT in the last 72 hours. No results for input(s): NA, K, CL, CO2, GLUCOSE, BUN, CREATININE, CALCIUM in the last 72 hours.  Intake/Output Summary (Last 24 hours) at 01/10/2019 0859 Last data filed at 01/10/2019 0700 Gross per 24 hour  Intake 720 ml  Output 200 ml  Net 520 ml     Physical Exam: Vital Signs Blood pressure 101/66, pulse 96, temperature 98.2 F (36.8 C), temperature source Oral, resp. rate 18, height 5\' 2"  (1.575 m), weight 65.2 kg, SpO2 94 %.   Constitutional: No distress . Vital signs reviewed. HEENT: EOMI, oral membranes moist Neck: supple Cardiovascular: RRR without murmur. No JVD    Respiratory: CTA Bilaterally without wheezes or rales. Normal effort    GI: BS +, non-tender, non-distended  Extremities: No clubbing, cyanosis, or edema Skin: No evidence of breakdown, no evidence of rash Neurologic: Cranial nerves II through XII intact, motor strength is 5/5 in bilateral deltoid, bicep, tricep, grip, hip flexor, knee extensors, ankle dorsiflexor and plantar flexor   Musculoskeletal: Full range of motion in all 4 extremities. No joint swelling   Assessment/Plan: 1. Functional deficits secondary to Left occipital infarct  Stable for D/C today F/u PCP in 3-4 weeks F/u PM&R 2 weeks See D/C summary See D/C instructions Care Tool:  Bathing    Body parts bathed by patient: Right arm, Left arm, Chest, Abdomen, Front perineal area, Buttocks, Right upper leg, Left upper leg, Right lower leg, Left lower leg, Face         Bathing assist Assist Level: Supervision/Verbal cueing     Upper Body Dressing/Undressing Upper body dressing   What is the patient wearing?: Pull  over shirt    Upper body assist Assist Level: Set up assist    Lower Body Dressing/Undressing Lower body dressing      What is the patient wearing?: Underwear/pull up, Pants     Lower body assist Assist for lower body dressing: Supervision/Verbal cueing     Toileting Toileting    Toileting assist Assist for toileting: Supervision/Verbal cueing Assistive Device Comment: urinal   Transfers Chair/bed transfer  Transfers assist     Chair/bed transfer assist level: Supervision/Verbal cueing     Locomotion Ambulation   Ambulation assist      Assist level: Supervision/Verbal cueing Assistive device: Walker-rolling Max distance: 150'   Walk 10 feet activity   Assist     Assist level: Supervision/Verbal cueing Assistive device: Walker-rolling   Walk 50 feet activity   Assist    Assist level: Supervision/Verbal cueing Assistive device: Walker-rolling    Walk 150 feet activity   Assist    Assist level: Supervision/Verbal cueing Assistive device: Walker-rolling    Walk 10 feet on uneven surface  activity   Assist Walk 10 feet on uneven surfaces activity did not occur: Safety/medical concerns   Assist level: Supervision/Verbal cueing Assistive device: Rollator   Wheelchair     Assist Will patient use wheelchair at discharge?: No   Wheelchair activity did not occur: N/A         Wheelchair 50 feet with 2 turns activity    Assist    Wheelchair 50 feet with 2 turns activity did not occur: N/A  Wheelchair 150 feet activity     Assist Wheelchair 150 feet activity did not occur: N/A         Medical Problem List and Plan: 1.  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 D/C THurs or Fri  - stable for D/C 2.  DVT Prophylaxis/Anticoagulation: SCDs. Monitor for any signs of DVT- no calf pain or swelling  3. Pain Management:  Tylenol as  needed 4. Mood:  Provide emotional support 5. Neuropsych: This patient is capable of making decisions on his own behalf. 6. Skin/Wound Care: routine skin checks 7. Fluids/Electrolytes/Nutrition:  Routine in and out's with follow-up chemistries, monitor meal intake 8.  Chronic diastolic congestive heart failure. Monitor for any signs of fluid overload. Patient on Lasix 20-40 mg as needed prior to admission. Resume as needed No pedal edema  -Daily weights appear stable Weight slightly up no clinical change- f/u PCP Filed Weights   01/08/19 0500 01/09/19 0609 01/10/19 0407  Weight: 64.6 kg 64.6 kg 65.2 kg    9. CKD. Creatinine baseline 1.56-1.70 10. Hypertension. Toprol-XL 25 mg daily, monitor vitals q shift Vitals:   01/09/19 1925 01/10/19 0407  BP: (!) 96/53 101/66  Pulse: 90 96  Resp: 18 18  Temp: 98 F (36.7 C) 98.2 F (36.8 C)  SpO2: 96% 94%  BPs controlled 2/20 11. Hypothyroidism.  TSH 2.124. Continue Synthroid 12. Hyperlipidemia. Lipitor     LOS: 7 days A FACE TO FACE EVALUATION WAS PERFORMED  Charlett Blake 01/10/2019, 8:59 AM

## 2019-01-10 NOTE — Progress Notes (Signed)
Social Work  Discharge Note  The overall goal for the admission was met for:   Discharge location: Yes-HOME WITH WIFE WHO CAN PROVIDE SUPERVISION LEVEL  Length of Stay: Yes-7 DAYS  Discharge activity level: Yes-SUPERVISION LEVEL  Home/community participation: Yes  Services provided included: MD, RD, PT, OT, RN, CM, TR, Pharmacy and SW  Financial Services: Private Insurance: [facility]  Follow-up services arranged: Home Health: [facility] CARE-PT,OT,AIDE, DME: [facility] and Patient/Family has no preference for HH/DME agencies  Comments (or additional information):WIFE WAS HERE DAILY AND OBSERVED. GAVE PRIVATE DUTY LIST FOR HIRING ASSIST IF DECIDE WANT IT.  Patient/Family verbalized understanding of follow-up arrangements: Yes  Individual responsible for coordination of the follow-up plan: ANNE MARIE-WIFE  Confirmed correct DME delivered: Elease Hashimoto 01/10/2019    Elease Hashimoto

## 2019-01-10 NOTE — Progress Notes (Signed)
Patient and family received discharge instructions from Marlowe Shores, PA-C with verbal understanding. Patient discharged to home with family and patient belongings.

## 2019-01-10 NOTE — Discharge Summary (Signed)
NAME: Robert Rowe, Robert Rowe MEDICAL RECORD NW:[id number] ACCOUNT [id number] DATE OF BIRTH:10-16-1918 FACILITY: [facility] LOCATION: [facility] PHYSICIAN:ANDREW Letta Pate, MD  DISCHARGE SUMMARY  DATE OF DISCHARGE:  01/10/2019  DISCHARGE DIAGNOSES: 1.  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 2.  Sequential compression devices for deep venous thrombosis prophylaxis. 3.  Pain management.  4.  Chronic diastolic congestive heart failure 5.   Chronic kidney disease.  6.   Hypertension 7.  Hypothyroidism. 8.  Hyperlipidemia.  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  Presented to 12/31/2018 with vertigo as well as limited hearing to the right ear.  The patient had  planned to see ENT.  There were reports of a fall in the bathroom with no loss of consciousness.  Upon arrival, patient's blood pressure 201/97.  He has been followed by Dr. Delice Lesch, neurology services, for the past 6 months for episodes of syncope - BPV,  well as bilateral leg weakness.  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  Recurrent symptoms concerning for hypoperfusion.  The patient was placed on Plavix.    Per chart review, he is a retired Engineer, drilling.  Lives with spouse, independent with a rolling walker outside the home, independent inside the home.  MRI of the brain showed a 6 mm acute ischemic nonhemorrhagic infarction of the left occipital lobe.   Echocardiogram with ejection fraction of 65%.  No evidence of left ventricular regional wall motion abnormalities.  EEG negative.  Maintained on aspirin and Plavix therapy.  Tolerating a regular diet.  Therapy evaluation is completed and the patient was  admitted  for comprehensive rehabilitation program.  PAST MEDICAL HISTORY:  See discharge diagnoses.  SOCIAL HISTORY:  Retired, lives with his wife.  Independent inside the home.  Used a walker outside the home.  Supportive family.  FUNCTIONAL STATUS:  Upon admission to rehab services, minimal guard 15 feet rolling walker, moderate assist with stand pivot transfers, minimal guard ADLs.  PHYSICAL EXAMINATION: VITAL SIGNS:  Blood pressure 131/84, pulse 88, temperature 97, respirations 18. GENERAL:  Alert male in no acute distress. HEENT:  EOMs intact. NECK:  Supple, nontender, no JVD. CARDIOVASCULAR:  Rate controlled. ABDOMEN:  Soft, nontender, good bowel sounds. LUNGS:  Clear to auscultation without wheeze.  NEUROLOGIC:  Manual muscle strength 5/5.  REHABILITATION HOSPITAL COURSE:  The patient was admitted to inpatient rehabilitation services.  Therapies initiated on a 3-hour daily basis, consisting of physical therapy, occupational therapy and rehabilitation nursing.  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  He would follow up neurology services.    SCDs for DVT prophylaxis.  No bleeding episodes.    Blood pressure is controlled on Toprol.    History of chronic diastolic congestive heart failure.  He did use Lasix prior to admission as needed.  He exhibited no signs of fluid overload.  Chronic kidney disease.  Creatinine baseline 1.56-1.70.  He would follow up with his outpatient PCP.    He remained on hormone supplement for hypothyroidism.  TSH level 2.124.    The patient received weekly collaborative interdisciplinary team conferences to discuss estimated length of stay, family teaching, any barriers to discharge.  Working with  energy conservation techniques.  Ambulating 150 feet rolling walker supervision.   Up and down stairs, supervision with minimal  verbal cues.  Ambulates to the apartment with supervision.  Performed bed transfers couch transfers, bed mobility, supervision.  Gathers belongings for activities of daily living and homemaking.  Plan was  discharge to home.  Family teaching completed.  DISCHARGE MEDICATIONS:  Included aspirin 81 mg p.o. daily, Lipitor 80 mg daily, Plavix 75 mg p.o. daily, Xalatan ophthalmic solution 1 drop both eyes at bedtime, Synthroid 50 mcg p.o. daily, Toprol-XL 25 mg p.o. daily, Protonix 40 mg p.o. daily, Tylenol  as needed.  DIET:  Regular consistency.  SPECIAL INSTRUCTIONS:  No driving.  Continue aspirin and Plavix x3 months, then Plavix alone.  FOLLOWUP:  The patient would follow up with Dr. Alysia Penna at the outpatient rehab center as directed; Dr. Ellouise Newer, call for appointment; Dr. Carolann Littler to 01/14/2019.  Dr. Lyman Bishop, cardiology services.  AN/NUANCE D:01/10/2019 T:01/10/2019 JOB:[id number]

## 2019-01-10 NOTE — Discharge Summary (Signed)
Discharge summary job [id number]

## 2019-01-11 ENCOUNTER — Ambulatory Visit: Payer: Medicare Other | Admitting: Neurology

## 2019-01-11 ENCOUNTER — Telehealth: Payer: Self-pay | Admitting: Registered Nurse

## 2019-01-11 DIAGNOSIS — H5462 Unqualified visual loss, left eye, normal vision right eye: Secondary | ICD-10-CM | POA: Diagnosis not present

## 2019-01-11 DIAGNOSIS — Z9181 History of falling: Secondary | ICD-10-CM | POA: Diagnosis not present

## 2019-01-11 DIAGNOSIS — M069 Rheumatoid arthritis, unspecified: Secondary | ICD-10-CM | POA: Diagnosis not present

## 2019-01-11 DIAGNOSIS — N189 Chronic kidney disease, unspecified: Secondary | ICD-10-CM | POA: Diagnosis not present

## 2019-01-11 DIAGNOSIS — R55 Syncope and collapse: Secondary | ICD-10-CM | POA: Diagnosis not present

## 2019-01-11 DIAGNOSIS — E785 Hyperlipidemia, unspecified: Secondary | ICD-10-CM | POA: Diagnosis not present

## 2019-01-11 DIAGNOSIS — I5032 Chronic diastolic (congestive) heart failure: Secondary | ICD-10-CM | POA: Diagnosis not present

## 2019-01-11 DIAGNOSIS — Z87891 Personal history of nicotine dependence: Secondary | ICD-10-CM | POA: Diagnosis not present

## 2019-01-11 DIAGNOSIS — I69398 Other sequelae of cerebral infarction: Secondary | ICD-10-CM | POA: Diagnosis not present

## 2019-01-11 DIAGNOSIS — K219 Gastro-esophageal reflux disease without esophagitis: Secondary | ICD-10-CM | POA: Diagnosis not present

## 2019-01-11 DIAGNOSIS — Z7982 Long term (current) use of aspirin: Secondary | ICD-10-CM | POA: Diagnosis not present

## 2019-01-11 DIAGNOSIS — I130 Hypertensive heart and chronic kidney disease with heart failure and stage 1 through stage 4 chronic kidney disease, or unspecified chronic kidney disease: Secondary | ICD-10-CM | POA: Diagnosis not present

## 2019-01-11 DIAGNOSIS — R2689 Other abnormalities of gait and mobility: Secondary | ICD-10-CM | POA: Diagnosis not present

## 2019-01-11 DIAGNOSIS — H8110 Benign paroxysmal vertigo, unspecified ear: Secondary | ICD-10-CM | POA: Diagnosis not present

## 2019-01-11 DIAGNOSIS — H9191 Unspecified hearing loss, right ear: Secondary | ICD-10-CM | POA: Diagnosis not present

## 2019-01-11 DIAGNOSIS — M1990 Unspecified osteoarthritis, unspecified site: Secondary | ICD-10-CM | POA: Diagnosis not present

## 2019-01-11 DIAGNOSIS — Z7902 Long term (current) use of antithrombotics/antiplatelets: Secondary | ICD-10-CM | POA: Diagnosis not present

## 2019-01-11 NOTE — Telephone Encounter (Signed)
Called patient and spoke to him and Robert Rowe and she stated that they have an appointment on Monday and would like Dr. Elease Hashimoto to look at the toenail to see what he thinks. I advised them of Dr. Anastasio Auerbach message. They both verbalized an agreement.

## 2019-01-11 NOTE — Telephone Encounter (Signed)
Transitional Care call Transitional Care Call Completed Mrs. Shiley answered the TC Questions  Patient name: LARRELL RAPOZO DOB: Apr 05, 1918 1. Are you/is patient experiencing any problems since coming home? No a. Are there any questions regarding any aspect of care? No 2. Are there any questions regarding medications administration/dosing? No a. Are meds being taken as prescribed? Yes b. "Patient should review meds with caller to confirm" Medication List Reviewed 3. Have there been any falls? No 4. Has Home Health been to the house and/or have they contacted you? Yes, [facility] a. If not, have you tried to contact them? NA b. Can we help you contact them? NA 5. Are bowels and bladder emptying properly? Yes a. Are there any unexpected incontinence issues? No b. If applicable, is patient following bowel/bladder programs? NA 6. Any fevers, problems with breathing, unexpected pain? No 7. Are there any skin problems or new areas of breakdown? No 8. Has the patient/family member arranged specialty MD follow up (ie cardiology/neurology/renal/surgical/etc.)?  Ms. Kassim in the process of scheduling HFU appointments she states.  a. Can we help arrange? NA 9. Does the patient need any other services or support that we can help arrange? No 10. Are caregivers following through as expected in assisting the patient? Yes 11. Has the patient quit smoking, drinking alcohol, or using drugs as recommended? Mr. Westervelt doesn't smoke he usually has a drink at [facility], she was instructed no drinking at this time, she verbalizes understanding. Denies any illicit drugs.   Appointment date/time 01/22/2019  arrival time 10:20 for 10:40 appointment With [facility] ANP-[facility]. At [place]

## 2019-01-12 ENCOUNTER — Encounter (HOSPITAL_COMMUNITY): Payer: Self-pay | Admitting: Emergency Medicine

## 2019-01-12 ENCOUNTER — Emergency Department (HOSPITAL_COMMUNITY): Payer: Medicare Other

## 2019-01-12 ENCOUNTER — Inpatient Hospital Stay (HOSPITAL_COMMUNITY)
Admission: EM | Admit: 2019-01-12 | Discharge: 2019-01-17 | DRG: 445 | Disposition: A | Discharge status: Skilled Nursing Facility | Payer: Medicare Other | Attending: Family Medicine | Admitting: Family Medicine

## 2019-01-12 DIAGNOSIS — R109 Unspecified abdominal pain: Secondary | ICD-10-CM | POA: Diagnosis present

## 2019-01-12 DIAGNOSIS — E039 Hypothyroidism, unspecified: Secondary | ICD-10-CM | POA: Diagnosis not present

## 2019-01-12 DIAGNOSIS — Z8673 Personal history of transient ischemic attack (TIA), and cerebral infarction without residual deficits: Secondary | ICD-10-CM

## 2019-01-12 DIAGNOSIS — K219 Gastro-esophageal reflux disease without esophagitis: Secondary | ICD-10-CM | POA: Diagnosis not present

## 2019-01-12 DIAGNOSIS — K810 Acute cholecystitis: Secondary | ICD-10-CM | POA: Diagnosis not present

## 2019-01-12 DIAGNOSIS — R410 Disorientation, unspecified: Secondary | ICD-10-CM | POA: Diagnosis not present

## 2019-01-12 DIAGNOSIS — N183 Chronic kidney disease, stage 3 unspecified: Secondary | ICD-10-CM | POA: Diagnosis present

## 2019-01-12 DIAGNOSIS — M069 Rheumatoid arthritis, unspecified: Secondary | ICD-10-CM | POA: Diagnosis present

## 2019-01-12 DIAGNOSIS — Z8249 Family history of ischemic heart disease and other diseases of the circulatory system: Secondary | ICD-10-CM

## 2019-01-12 DIAGNOSIS — I10 Essential (primary) hypertension: Secondary | ICD-10-CM | POA: Diagnosis present

## 2019-01-12 DIAGNOSIS — H5462 Unqualified visual loss, left eye, normal vision right eye: Secondary | ICD-10-CM | POA: Diagnosis not present

## 2019-01-12 DIAGNOSIS — R1011 Right upper quadrant pain: Secondary | ICD-10-CM

## 2019-01-12 DIAGNOSIS — Z87891 Personal history of nicotine dependence: Secondary | ICD-10-CM

## 2019-01-12 DIAGNOSIS — Z79899 Other long term (current) drug therapy: Secondary | ICD-10-CM

## 2019-01-12 DIAGNOSIS — H8110 Benign paroxysmal vertigo, unspecified ear: Secondary | ICD-10-CM | POA: Diagnosis not present

## 2019-01-12 DIAGNOSIS — Z7982 Long term (current) use of aspirin: Secondary | ICD-10-CM

## 2019-01-12 DIAGNOSIS — I5032 Chronic diastolic (congestive) heart failure: Secondary | ICD-10-CM | POA: Diagnosis present

## 2019-01-12 DIAGNOSIS — H9191 Unspecified hearing loss, right ear: Secondary | ICD-10-CM | POA: Diagnosis not present

## 2019-01-12 DIAGNOSIS — K812 Acute cholecystitis with chronic cholecystitis: Principal | ICD-10-CM | POA: Diagnosis present

## 2019-01-12 DIAGNOSIS — E876 Hypokalemia: Secondary | ICD-10-CM | POA: Diagnosis present

## 2019-01-12 DIAGNOSIS — Z9079 Acquired absence of other genital organ(s): Secondary | ICD-10-CM

## 2019-01-12 DIAGNOSIS — E785 Hyperlipidemia, unspecified: Secondary | ICD-10-CM | POA: Diagnosis present

## 2019-01-12 DIAGNOSIS — Z7902 Long term (current) use of antithrombotics/antiplatelets: Secondary | ICD-10-CM | POA: Diagnosis not present

## 2019-01-12 DIAGNOSIS — R2689 Other abnormalities of gait and mobility: Secondary | ICD-10-CM | POA: Diagnosis not present

## 2019-01-12 DIAGNOSIS — K5900 Constipation, unspecified: Secondary | ICD-10-CM | POA: Diagnosis not present

## 2019-01-12 DIAGNOSIS — R079 Chest pain, unspecified: Secondary | ICD-10-CM | POA: Diagnosis not present

## 2019-01-12 DIAGNOSIS — R1013 Epigastric pain: Secondary | ICD-10-CM | POA: Diagnosis not present

## 2019-01-12 DIAGNOSIS — R0902 Hypoxemia: Secondary | ICD-10-CM | POA: Diagnosis not present

## 2019-01-12 DIAGNOSIS — K449 Diaphragmatic hernia without obstruction or gangrene: Secondary | ICD-10-CM | POA: Diagnosis not present

## 2019-01-12 DIAGNOSIS — M1990 Unspecified osteoarthritis, unspecified site: Secondary | ICD-10-CM | POA: Diagnosis not present

## 2019-01-12 DIAGNOSIS — I69398 Other sequelae of cerebral infarction: Secondary | ICD-10-CM | POA: Diagnosis not present

## 2019-01-12 DIAGNOSIS — K819 Cholecystitis, unspecified: Secondary | ICD-10-CM | POA: Diagnosis not present

## 2019-01-12 DIAGNOSIS — I130 Hypertensive heart and chronic kidney disease with heart failure and stage 1 through stage 4 chronic kidney disease, or unspecified chronic kidney disease: Secondary | ICD-10-CM | POA: Diagnosis not present

## 2019-01-12 DIAGNOSIS — N189 Chronic kidney disease, unspecified: Secondary | ICD-10-CM | POA: Diagnosis not present

## 2019-01-12 DIAGNOSIS — I959 Hypotension, unspecified: Secondary | ICD-10-CM | POA: Diagnosis not present

## 2019-01-12 DIAGNOSIS — R1084 Generalized abdominal pain: Secondary | ICD-10-CM | POA: Diagnosis not present

## 2019-01-12 DIAGNOSIS — N400 Enlarged prostate without lower urinary tract symptoms: Secondary | ICD-10-CM | POA: Diagnosis present

## 2019-01-12 DIAGNOSIS — K820 Obstruction of gallbladder: Secondary | ICD-10-CM | POA: Diagnosis not present

## 2019-01-12 DIAGNOSIS — R55 Syncope and collapse: Secondary | ICD-10-CM | POA: Diagnosis not present

## 2019-01-12 DIAGNOSIS — Z79890 Hormone replacement therapy (postmenopausal): Secondary | ICD-10-CM

## 2019-01-12 DIAGNOSIS — Z9181 History of falling: Secondary | ICD-10-CM | POA: Diagnosis not present

## 2019-01-12 HISTORY — DX: Acute cholecystitis: K81.0

## 2019-01-12 LAB — CBC
HCT: 43.5 % (ref 39.0–52.0)
Hemoglobin: 13.8 g/dL (ref 13.0–17.0)
MCH: 31.1 pg (ref 26.0–34.0)
MCHC: 31.7 g/dL (ref 30.0–36.0)
MCV: 98 fL (ref 80.0–100.0)
Platelets: 248 10*3/uL (ref 150–400)
RBC: 4.44 MIL/uL (ref 4.22–5.81)
RDW: 12.7 % (ref 11.5–15.5)
WBC: 11.4 10*3/uL — ABNORMAL HIGH (ref 4.0–10.5)
nRBC: 0 % (ref 0.0–0.2)

## 2019-01-12 LAB — COMPREHENSIVE METABOLIC PANEL
ALT: 14 U/L (ref 0–44)
AST: 17 U/L (ref 15–41)
Albumin: 3.4 g/dL — ABNORMAL LOW (ref 3.5–5.0)
Alkaline Phosphatase: 78 U/L (ref 38–126)
Anion gap: 9 (ref 5–15)
BUN: 18 mg/dL (ref 8–23)
CO2: 24 mmol/L (ref 22–32)
Calcium: 8.7 mg/dL — ABNORMAL LOW (ref 8.9–10.3)
Chloride: 106 mmol/L (ref 98–111)
Creatinine, Ser: 1.55 mg/dL — ABNORMAL HIGH (ref 0.61–1.24)
GFR calc Af Amer: 43 mL/min — ABNORMAL LOW (ref >60)
GFR calc non Af Amer: 37 mL/min — ABNORMAL LOW (ref >60)
Glucose, Bld: 155 mg/dL — ABNORMAL HIGH (ref 70–99)
Potassium: 4 mmol/L (ref 3.5–5.1)
Sodium: 139 mmol/L (ref 135–145)
Total Bilirubin: 0.6 mg/dL (ref 0.3–1.2)
Total Protein: 6.2 g/dL — ABNORMAL LOW (ref 6.5–8.1)

## 2019-01-12 LAB — LIPASE, BLOOD: Lipase: 42 U/L (ref 11–51)

## 2019-01-12 LAB — TROPONIN I: Troponin I: <0.03 ng/mL (ref <0.03)

## 2019-01-12 MED ORDER — IOHEXOL 300 MG/ML  SOLN
80.0000 mL | Freq: Once | INTRAMUSCULAR | Status: AC | PRN
  Administered 2019-01-12: 80 mL via INTRAVENOUS

## 2019-01-12 MED ORDER — FENTANYL CITRATE (PF) 100 MCG/2ML IJ SOLN
25.0000 ug | Freq: Once | INTRAMUSCULAR | Status: AC
  Administered 2019-01-12: 25 ug via INTRAVENOUS
  Filled 2019-01-12: qty 2

## 2019-01-12 MED ORDER — SODIUM CHLORIDE 0.9% FLUSH
3.0000 mL | Freq: Once | INTRAVENOUS | Status: AC
  Administered 2019-01-13: 3 mL via INTRAVENOUS

## 2019-01-12 NOTE — ED Triage Notes (Signed)
Patient having sudden onset of epigastric pain, he did take 2 sl nitro.  The pain went away and did not come to ED.  Pain came back, took another SL nitro.  He is having more upper right quadrant pain, abdomen is rigid and tender to palpation.  No chest pain at this time.  No fevers, no nausea or vomiting.  He does take Protonix daily.  He is having a cramping pain feeling.

## 2019-01-12 NOTE — ED Provider Notes (Signed)
[facility] EMERGENCY DEPARTMENT Provider Note   CSN: [id number] Arrival date & time: 01/12/19  2148    History   Chief Complaint Chief Complaint  Patient presents with  . Abdominal Pain    HPI Robert Rowe is a 85 y.o. male.     85 y/o male with an extensive  PMH and recently hospitalized from 02/13-02/20 due to CVA presents to the ED with a chief complaint of right upper quadrant abdominal pain x 3 hours. Patient describes the sudden onset of a constant discomfort to the right upper which does not radiate. He reports taking Nitro x 2 which did not relieve his symptoms. Patient recently was discharged from rehabilitation after his CVA, reports his last bowel movement was Thursday and it was diarrhea since then he has not had a BM. Son at the bedside reports he did take 2 stool softeners prior to arrival. Patient reported during triage interview that his pain was localized on part of his right chest. He denies any vomiting, nausea, shortness of breath, weakness, or other complaints.      Past Medical History:  Diagnosis Date  . BENIGN PROSTATIC HYPERTROPHY 04/05/2007  . 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 CHEST DISCOMFORT 04/25/2009  . Chronic diastolic CHF (congestive heart failure) ([place])    a. Echo 12/16: mild LVH, EF 55-60%, no RWMA, Gr 1 DD, mild AI, MAC, mild LAE, normal RVF, PASP 37 mmHg  . Chronic kidney disease    chronic   . DJD (degenerative joint disease)   . Dysrhythmia    pvc's   . GERD 12/03/2007  . Hiatal hernia   . History of nuclear stress test    Myoview 1/17: EF 53%, normal perfusion; Low Risk  . HYPERLIPIDEMIA 04/05/2007  . Hypothyroidism   . Osteoarth NOS-Unspec 04/05/2007  . Palpitations 02/12/2010  . PEDAL EDEMA 05/01/2008  . Rheumatoid arthritis(714.0) 04/05/2007  . Shortness of breath dyspnea   . TRANSIENT ISCHEMIC ATTACK, HX OF 04/05/2007   ?     Patient Active Problem List   Diagnosis Date Noted  .  Intractable abdominal pain 01/13/2019  . 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 Gait disturbance, post-stroke   . BPV (benign positional vertigo) 01/02/2019  . History of stroke 01/01/2019  . TIA (transient ischemic attack) 12/31/2018  . CVA (cerebral vascular accident) ([place]) 12/31/2018  . Chest pain 05/15/2018  . CKD (chronic kidney disease) stage 3, GFR 30-59 ml/min (HCC) 05/08/2018  . Other fatigue 06/13/2017  . Abnormal diffusion capacity determined by pulmonary function test 06/13/2017  . Chronic diastolic CHF (congestive heart failure) ([place]) 11/01/2016  . Essential hypertension 11/01/2016  . Intracranial vascular stenosis 11/09/2015  . Mild cognitive impairment 11/09/2015  . 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 Benign prostatic hyperplasia with urinary obstruction 03/19/2015  . Awareness alteration, transient 02/26/2015  . PVC's (premature ventricular contractions) 08/22/2013  . Hypothyroidism 05/09/2013  . Stricture and stenosis of esophagus 03/12/2013  . Dysphagia, unspecified(787.20) 02/22/2013  . DOE (dyspnea on exertion) 03/23/2011  . PALPITATIONS 02/12/2010  . CHEST DISCOMFORT 04/25/2009  . PEDAL EDEMA 05/01/2008  . GERD 12/03/2007  . Dyslipidemia 04/05/2007  . BENIGN PROSTATIC HYPERTROPHY 04/05/2007  . Rheumatoid arthritis ([place]) 04/05/2007  . Osteoarthritis 04/05/2007  . History of cardiovascular disorder 04/05/2007    Past Surgical History:  Procedure Laterality Date  . BALLOON DILATION N/A 03/12/2013   Procedure: BALLOON DILATION;  Surgeon: Inda Castle, MD;  Location: [facility];  Service:  Endoscopy;  Laterality: N/A;  . CARPAL TUNNEL RELEASE  11/28/2018   Left hand  . CATARACT EXTRACTION  2001   bilateral  . CYSTOSCOPY WITH URETHRAL DILATATION N/A 09/24/2015   Procedure: CYSTOSCOPY WITH COOK BALLOON DILATATION OF URETHRAL STRICTURE ;  Surgeon: Carolan Clines, MD;  Location: [facility];  Service: Urology;  Laterality: N/A;  . CYSTOSCOPY  WITH URETHRAL DILATATION N/A 07/20/2016   Procedure: CYSTOSCOPY WITH URETHRAL DILATATION;  Surgeon: Alexis Frock, MD;  Location: [facility];  Service: Urology;  Laterality: N/A;  1 HOUR  . ESOPHAGOGASTRODUODENOSCOPY N/A 03/12/2013   Procedure: ESOPHAGOGASTRODUODENOSCOPY (EGD);  Surgeon: Inda Castle, MD;  Location: [facility];  Service: Endoscopy;  Laterality: N/A;  . HEMORRHOID SURGERY    . HERNIA REPAIR     inguinal x5  . KNEE SURGERY  arthroscopic   left  . PROSTATE CRYOABLATION     2000  . SHOULDER SURGERY  few years ago   left  . TRANSURETHRAL RESECTION OF PROSTATE N/A 03/19/2015   Procedure: TRANSURETHRAL RESECTION OF THE PROSTATE (TURP);  Surgeon: Carolan Clines, MD;  Location: [facility];  Service: Urology;  Laterality: N/A;        Home Medications    Prior to Admission medications   Medication Sig Start Date End Date Taking? Authorizing Provider  acetaminophen (TYLENOL) 325 MG tablet Take 2 tablets (650 mg total) by mouth every 6 (six) hours as needed for mild pain (or Fever >/= 101). 01/10/19  Yes Angiulli, Lavon Paganini, PA-C  aspirin EC 81 MG EC tablet Take 1 tablet (81 mg total) by mouth daily for 30 days. 01/03/19 02/02/19 Yes Arrien, Jimmy Picket, MD  atorvastatin (LIPITOR) 80 MG tablet Take 1 tablet (80 mg total) by mouth daily at 6 PM for 30 days. 01/10/19 02/09/19 Yes Angiulli, Lavon Paganini, PA-C  clopidogrel (PLAVIX) 75 MG tablet Take 1 tablet (75 mg total) by mouth daily. 01/10/19  Yes Angiulli, Lavon Paganini, PA-C  furosemide (LASIX) 20 MG tablet TAKE 1 TABLET BY MOUTH  DAILY AS NEEDED FOR FLUID  OR EDEMA. Patient taking differently: Take 20-40 mg by mouth daily as needed (for fluid retention in the legs).  07/02/18  Yes Hilty, Nadean Corwin, MD  latanoprost (XALATAN) 0.005 % ophthalmic solution Place 1 drop into both eyes at bedtime.  02/08/18  Yes [provider]  levothyroxine (SYNTHROID, LEVOTHROID) 50 MCG tablet TAKE 1 TABLET BY MOUTH  DAILY Patient taking differently:  Take 50 mcg by mouth daily before breakfast.  01/10/19  Yes Angiulli, Lavon Paganini, PA-C  meclizine (ANTIVERT) 12.5 MG tablet Take 1 tablet (12.5 mg total) by mouth 3 (three) times daily as needed for dizziness. 01/10/19  Yes Angiulli, Lavon Paganini, PA-C  metoprolol succinate (TOPROL-XL) 25 MG 24 hr tablet Take 1 tablet (25 mg total) by mouth at bedtime. 01/10/19  Yes Angiulli, Lavon Paganini, PA-C  Multiple Vitamin (MULTIVITAMIN WITH MINERALS) TABS tablet Take 1 tablet by mouth 2 (two) times a week.    Yes [provider]  nitroGLYCERIN (NITROSTAT) 0.4 MG SL tablet Place 0.4 mg under the tongue every 5 (five) minutes as needed (for Esophageal spasms).    Yes [provider]  pantoprazole (PROTONIX) 40 MG tablet Take 1 tablet (40 mg total) by mouth daily. 01/10/19  Yes Angiulli, Lavon Paganini, PA-C  albuterol (PROVENTIL HFA;VENTOLIN HFA) 108 (90 Base) MCG/ACT inhaler Inhale 2 puffs into the lungs every 4 (four) hours as needed for wheezing or shortness of breath. Patient not taking: Reported on 12/31/2018 11/02/18  Laurey Morale, MD    Family History Family History  Problem Relation Age of Onset  . Breast cancer Mother   . Heart disease Father   . Heart attack Father   . Lymphoma Brother   . Stroke Neg Hx   . Hypertension Neg Hx     Social History Social History   Tobacco Use  . Smoking status: Former Smoker    Types: Cigarettes    Last attempt to quit: 08/13/1953    Years since quitting: 65.4  . Smokeless tobacco: Never Used  Substance Use Topics  . Alcohol use: Yes    Comment: occ vodka  . Drug use: No     Allergies   Codeine; Sulfasalazine; and Sulfonamide derivatives   Review of Systems Review of Systems  Constitutional: Negative for chills and fever.  HENT: Negative for ear pain and sore throat.   Eyes: Negative for pain and visual disturbance.  Respiratory: Negative for cough and shortness of breath.   Cardiovascular: Positive for chest pain. Negative for palpitations.   Gastrointestinal: Positive for abdominal pain and diarrhea. Negative for nausea and vomiting.  Genitourinary: Negative for dysuria and hematuria.  Musculoskeletal: Negative for arthralgias and back pain.  Skin: Negative for color change and rash.  Neurological: Negative for seizures and syncope.  All other systems reviewed and are negative.    Physical Exam Updated Vital Signs BP (!) 169/90   Pulse (!) 102   Resp (!) 21   SpO2 95%   Physical Exam Vitals signs and nursing note reviewed.  Constitutional:      Appearance: He is well-developed.  HENT:     Head: Normocephalic and atraumatic.  Eyes:     General: No scleral icterus.    Pupils: Pupils are equal, round, and reactive to light.  Neck:     Musculoskeletal: Normal range of motion.  Cardiovascular:     Heart sounds: Normal heart sounds.  Pulmonary:     Effort: Pulmonary effort is normal.     Breath sounds: Normal breath sounds. No wheezing.  Chest:     Chest wall: No tenderness.  Abdominal:     General: Bowel sounds are increased. There is distension. There is no abdominal bruit.     Palpations: Abdomen is soft.     Tenderness: There is generalized abdominal tenderness and tenderness in the right upper quadrant. There is no right CVA tenderness, left CVA tenderness or rebound. Negative signs include obturator sign.     Hernia: A hernia is present. Hernia is present in the umbilical area.  Musculoskeletal:        General: No tenderness or deformity.  Skin:    General: Skin is warm and dry.  Neurological:     Mental Status: He is alert and oriented to person, place, and time.      ED Treatments / Results  Labs (all labs ordered are listed, but only abnormal results are displayed) Labs Reviewed  COMPREHENSIVE METABOLIC PANEL - Abnormal; Notable for the following components:      Result Value   Glucose, Bld 155 (*)    Creatinine, Ser 1.55 (*)    Calcium 8.7 (*)    Total Protein 6.2 (*)    Albumin 3.4 (*)      GFR calc non Af Amer 37 (*)    GFR calc Af Amer 43 (*)    All other components within normal limits  CBC - Abnormal; Notable for the following components:   WBC 11.4 (*)  All other components within normal limits  LIPASE, BLOOD  URINALYSIS, ROUTINE W REFLEX MICROSCOPIC  TROPONIN I  TROPONIN I  LACTIC ACID, PLASMA  LACTIC ACID, PLASMA    EKG EKG Interpretation  Date/Time:  Saturday January 12 2019 21:57:15 EST Ventricular Rate:  78 PR Interval:    QRS Duration: 83 QT Interval:  427 QTC Calculation: 487 R Axis:   20 Text Interpretation:  Sinus rhythm Atrial premature complex Borderline prolonged QT interval Confirmed by Sherwood Gambler [phone]) on 01/12/2019 10:26:26 PM   Radiology Ct Abdomen Pelvis W Contrast  Result Date: 01/12/2019 CLINICAL DATA:  Right upper quadrant pain EXAM: CT ABDOMEN AND PELVIS WITH CONTRAST TECHNIQUE: Multidetector CT imaging of the abdomen and pelvis was performed using the standard protocol following bolus administration of intravenous contrast. CONTRAST:  61m OMNIPAQUE IOHEXOL 300 MG/ML  SOLN COMPARISON:  Ultrasound 05/14/2018 FINDINGS: Lower chest: Lung bases demonstrate no acute consolidation or effusion. Atelectasis at the left base. Heart size within normal limits. Large hiatal hernia. Distal esophageal thickening. Hepatobiliary: No focal liver abnormality is seen. No gallstones, gallbladder wall thickening, or biliary dilatation. Pancreas: Unremarkable. No pancreatic ductal dilatation or surrounding inflammatory changes. Spleen: Normal in size without focal abnormality. Adrenals/Urinary Tract: Adrenal glands are normal. No hydronephrosis. Cysts within the bilateral kidneys. Bladder is normal Stomach/Bowel: The stomach is nonenlarged. No dilated small bowel. Descending and sigmoid colon diverticular disease without acute inflammatory change. Vascular/Lymphatic: Ectatic distal infrarenal abdominal aorta up to 2.9 cm. Moderate aortic atherosclerosis.  No significantly enlarged lymph nodes. Reproductive: Post TURP changes of enlarged prostate. Other: Negative for free air or free fluid. Fat and small calcification in the umbilical region with tiny fluid. Musculoskeletal: Degenerative changes. No acute or suspicious osseous abnormality. IMPRESSION: 1. No CT evidence for acute intra-abdominal or pelvic abnormality. 2. Large hiatal hernia. Distal esophageal thickening, possible esophagitis or reflux 3. Extensive left colon diverticular disease without acute inflammatory change 4. Ectatic distal infrarenal abdominal aorta up to 2.9 cm. Ectatic abdominal aorta at risk for aneurysm development. Recommend followup by ultrasound in 5 years. This recommendation follows ACR consensus guidelines: White Paper of the ACR Incidental Findings Committee II on Vascular Findings. J Am Coll Radiol 2013; 10:789-794. Electronically Signed   By: KDonavan FoilM.D.   On: 01/12/2019 23:39   [place]domen Limited  Result Date: 01/13/2019 CLINICAL DATA:  Epigastric pain EXAM: ULTRASOUND ABDOMEN LIMITED RIGHT UPPER QUADRANT COMPARISON:  CT 01/12/2019 FINDINGS: Gallbladder: No shadowing stones or sludge. Increased wall thickness at 4.1 mm. Negative sonographic Murphy. Common bile duct: Diameter: Poorly visualized, about 2.2 mm Liver: No focal lesion identified. Within normal limits in parenchymal echogenicity. Portal vein is patent on color Doppler imaging with normal direction of blood flow towards the liver. IMPRESSION: No shadowing stones. Slightly thickened gallbladder wall, nonspecific finding, could be secondary to acute or chronic cholecystitis, liver disease, or edema forming states. If acute gallbladder disease remains a concern, further evaluation with hepatobiliary nuclear medicine study could be obtained. Electronically Signed   By: KDonavan FoilM.D.   On: 01/13/2019 01:43    Procedures Procedures (including critical care time)  Medications Ordered in ED Medications   sodium chloride flush (NS) 0.9 % injection 3 mL (3 mLs Intravenous Given 01/13/19 0302)  fentaNYL (SUBLIMAZE) injection 25 mcg (25 mcg Intravenous Given 01/12/19 2233)  iohexol (OMNIPAQUE) 300 MG/ML solution 80 mL (80 mLs Intravenous Contrast Given 01/12/19 2308)  sodium chloride 0.9 % bolus 125 mL (0 mLs Intravenous Stopped 01/13/19 0208)  dicyclomine (  BENTYL) injection 20 mg (20 mg Intramuscular Given 01/13/19 0052)  fentaNYL (SUBLIMAZE) injection 25 mcg (25 mcg Intravenous Given 01/13/19 0302)     Initial Impression / Assessment and Plan / ED Course  I have reviewed the triage vital signs and the nursing notes.  Pertinent labs & imaging results that were available during my care of the patient were reviewed by me and considered in my medical decision making (see chart for details).    Patient presents brought in by family for abdominal pain since this evening around 7 PM.  He reports scribes it sharp along the right upper quadrant.  Patient has taken 2 nitroglycerin for his pain and reports no relieving symptoms.  During evaluation patient has tenderness along the right upper quadrant.  Will obtain blood work along with CT imaging to rule out any constipation, cholecystitis.  Patient was discharged 2 days ago from the hospital and reports he has not had a bowel movement in 2 days.  And was given fentanyl 25 mics along with Bentyl for his pain, he reports his pain is worsening.  CMP showed no electrolyte abnormality, creatinine slightly elevated provided him with 125 bolus.  UA showed no signs of infection.  Lipase was within normal limits.  CBC showed slight leukocytosis.  CT abdomen show gallbladder thickening, will obtain ultrasound for further investigation on gallbladder.  Ultrasound showed:  No shadowing stones. Slightly thickened gallbladder wall,  nonspecific finding, could be secondary to acute or chronic  cholecystitis, liver disease, or edema forming states. If acute  gallbladder  disease remains a concern, further evaluation with  hepatobiliary nuclear medicine study could be obtained.     Spoke to Dr. Redmond Pulling from general surgery, low suspicion for cholecystitis due to patient's indications and pain.  States patient could benefit from HIDA scan to rule out a calculus cholecystitis, will need to schedule this in the morning.  Will place call for hospitalist admission.  Lactic acid added on, second troponin added on as well.  3:34 AM Spoke to Dr. Myna Hidalgo who will admit patient for HIDA scan in the morning and pain control.   Final Clinical Impressions(s) / ED Diagnoses   Final diagnoses:  Right upper quadrant abdominal pain    ED Discharge Orders    None       Janeece Fitting, PA-C 01/13/19 [place], MD 01/13/19 [phone]

## 2019-01-13 ENCOUNTER — Emergency Department (HOSPITAL_COMMUNITY): Payer: Medicare Other

## 2019-01-13 ENCOUNTER — Encounter (HOSPITAL_COMMUNITY): Payer: Self-pay | Admitting: Family Medicine

## 2019-01-13 ENCOUNTER — Observation Stay (HOSPITAL_COMMUNITY): Payer: Medicare Other

## 2019-01-13 DIAGNOSIS — R1011 Right upper quadrant pain: Secondary | ICD-10-CM | POA: Diagnosis not present

## 2019-01-13 DIAGNOSIS — R109 Unspecified abdominal pain: Secondary | ICD-10-CM

## 2019-01-13 LAB — URINALYSIS, ROUTINE W REFLEX MICROSCOPIC
Bilirubin Urine: NEGATIVE
Glucose, UA: NEGATIVE mg/dL
Hgb urine dipstick: NEGATIVE
Ketones, ur: NEGATIVE mg/dL
Leukocytes,Ua: NEGATIVE
Nitrite: NEGATIVE
Protein, ur: NEGATIVE mg/dL
Specific Gravity, Urine: 1.025 (ref 1.005–1.030)
pH: 6 (ref 5.0–8.0)

## 2019-01-13 LAB — CBC WITH DIFFERENTIAL/PLATELET
Abs Immature Granulocytes: 0.04 10*3/uL (ref 0.00–0.07)
Basophils Absolute: 0.1 10*3/uL (ref 0.0–0.1)
Basophils Relative: 0 %
Eosinophils Absolute: 0 10*3/uL (ref 0.0–0.5)
Eosinophils Relative: 0 %
HCT: 40.9 % (ref 39.0–52.0)
Hemoglobin: 13.5 g/dL (ref 13.0–17.0)
Immature Granulocytes: 0 %
Lymphocytes Relative: 12 %
Lymphs Abs: 1.4 10*3/uL (ref 0.7–4.0)
MCH: 31.3 pg (ref 26.0–34.0)
MCHC: 33 g/dL (ref 30.0–36.0)
MCV: 94.7 fL (ref 80.0–100.0)
Monocytes Absolute: 0.8 10*3/uL (ref 0.1–1.0)
Monocytes Relative: 7 %
Neutro Abs: 9.2 10*3/uL — ABNORMAL HIGH (ref 1.7–7.7)
Neutrophils Relative %: 81 %
Platelets: 256 10*3/uL (ref 150–400)
RBC: 4.32 MIL/uL (ref 4.22–5.81)
RDW: 12.7 % (ref 11.5–15.5)
WBC: 11.5 10*3/uL — ABNORMAL HIGH (ref 4.0–10.5)
nRBC: 0 % (ref 0.0–0.2)

## 2019-01-13 LAB — TROPONIN I: Troponin I: <0.03 ng/mL (ref <0.03)

## 2019-01-13 LAB — COMPREHENSIVE METABOLIC PANEL
ALT: 15 U/L (ref 0–44)
AST: 19 U/L (ref 15–41)
Albumin: 3.4 g/dL — ABNORMAL LOW (ref 3.5–5.0)
Alkaline Phosphatase: 77 U/L (ref 38–126)
Anion gap: 15 (ref 5–15)
BUN: 15 mg/dL (ref 8–23)
CO2: 21 mmol/L — ABNORMAL LOW (ref 22–32)
Calcium: 8.6 mg/dL — ABNORMAL LOW (ref 8.9–10.3)
Chloride: 104 mmol/L (ref 98–111)
Creatinine, Ser: 1.4 mg/dL — ABNORMAL HIGH (ref 0.61–1.24)
GFR calc Af Amer: 48 mL/min — ABNORMAL LOW (ref >60)
GFR calc non Af Amer: 42 mL/min — ABNORMAL LOW (ref >60)
Glucose, Bld: 154 mg/dL — ABNORMAL HIGH (ref 70–99)
Potassium: 3.7 mmol/L (ref 3.5–5.1)
Sodium: 140 mmol/L (ref 135–145)
Total Bilirubin: 0.6 mg/dL (ref 0.3–1.2)
Total Protein: 6.4 g/dL — ABNORMAL LOW (ref 6.5–8.1)

## 2019-01-13 LAB — GLUCOSE, CAPILLARY: Glucose-Capillary: 139 mg/dL — ABNORMAL HIGH (ref 70–99)

## 2019-01-13 LAB — LACTIC ACID, PLASMA: Lactic Acid, Venous: 1.2 mmol/L (ref 0.5–1.9)

## 2019-01-13 MED ORDER — HYDRALAZINE HCL 20 MG/ML IJ SOLN
10.0000 mg | Freq: Four times a day (QID) | INTRAMUSCULAR | Status: DC | PRN
  Administered 2019-01-13: 10 mg via INTRAVENOUS
  Filled 2019-01-13: qty 1

## 2019-01-13 MED ORDER — ASPIRIN EC 81 MG PO TBEC
81.0000 mg | DELAYED_RELEASE_TABLET | Freq: Every day | ORAL | Status: DC
  Administered 2019-01-13 – 2019-01-17 (×5): 81 mg via ORAL
  Filled 2019-01-13 (×5): qty 1

## 2019-01-13 MED ORDER — SODIUM CHLORIDE 0.9 % IV BOLUS
125.0000 mL | Freq: Once | INTRAVENOUS | Status: AC
  Administered 2019-01-13: 125 mL via INTRAVENOUS

## 2019-01-13 MED ORDER — FENTANYL CITRATE (PF) 100 MCG/2ML IJ SOLN: 12.5000 ug | INTRAMUSCULAR | Status: DC | PRN

## 2019-01-13 MED ORDER — MORPHINE SULFATE (PF) 4 MG/ML IV SOLN
INTRAVENOUS | Status: AC
  Filled 2019-01-13: qty 1

## 2019-01-13 MED ORDER — DICYCLOMINE HCL 10 MG/ML IM SOLN
20.0000 mg | Freq: Once | INTRAMUSCULAR | Status: AC
  Administered 2019-01-13: 20 mg via INTRAMUSCULAR
  Filled 2019-01-13: qty 2

## 2019-01-13 MED ORDER — TECHNETIUM TC 99M MEBROFENIN IV KIT
5.4100 | PACK | Freq: Once | INTRAVENOUS | Status: AC | PRN
  Administered 2019-01-13: 5.41 via INTRAVENOUS

## 2019-01-13 MED ORDER — LATANOPROST 0.005 % OP SOLN
1.0000 [drp] | Freq: Every day | OPHTHALMIC | Status: DC
  Administered 2019-01-13 – 2019-01-16 (×4): 1 [drp] via OPHTHALMIC
  Filled 2019-01-13: qty 2.5

## 2019-01-13 MED ORDER — ACETAMINOPHEN 325 MG PO TABS: 650.0000 mg | ORAL_TABLET | Freq: Four times a day (QID) | ORAL | Status: DC | PRN

## 2019-01-13 MED ORDER — MORPHINE SULFATE (PF) 4 MG/ML IV SOLN
2.7000 mg | Freq: Once | INTRAVENOUS | Status: AC
  Administered 2019-01-13: 2.7 mg via INTRAVENOUS

## 2019-01-13 MED ORDER — ACETAMINOPHEN 650 MG RE SUPP: 650.0000 mg | Freq: Four times a day (QID) | RECTAL | Status: DC | PRN

## 2019-01-13 MED ORDER — ONDANSETRON HCL 4 MG/2ML IJ SOLN: 4.0000 mg | Freq: Four times a day (QID) | INTRAMUSCULAR | Status: DC | PRN

## 2019-01-13 MED ORDER — SODIUM CHLORIDE 0.9 % IV SOLN
INTRAVENOUS | Status: DC
  Administered 2019-01-13: 05:00:00 via INTRAVENOUS

## 2019-01-13 MED ORDER — ONDANSETRON HCL 4 MG PO TABS: 4.0000 mg | ORAL_TABLET | Freq: Four times a day (QID) | ORAL | Status: DC | PRN

## 2019-01-13 MED ORDER — SODIUM CHLORIDE 0.9 % IV SOLN
2.0000 g | INTRAVENOUS | Status: DC
  Administered 2019-01-13: 2 g via INTRAVENOUS
  Filled 2019-01-13 (×3): qty 20

## 2019-01-13 MED ORDER — FENTANYL CITRATE (PF) 100 MCG/2ML IJ SOLN
25.0000 ug | Freq: Once | INTRAMUSCULAR | Status: AC
  Administered 2019-01-13: 25 ug via INTRAVENOUS
  Filled 2019-01-13: qty 2

## 2019-01-13 MED ORDER — SODIUM CHLORIDE 0.9 % IV SOLN
INTRAVENOUS | Status: DC
  Administered 2019-01-14: via INTRAVENOUS

## 2019-01-13 MED ORDER — HEPARIN SODIUM (PORCINE) 5000 UNIT/ML IJ SOLN
5000.0000 [IU] | Freq: Three times a day (TID) | INTRAMUSCULAR | Status: DC
  Administered 2019-01-13 – 2019-01-17 (×13): 5000 [IU] via SUBCUTANEOUS
  Filled 2019-01-13 (×13): qty 1

## 2019-01-13 MED ORDER — LEVOTHYROXINE SODIUM 50 MCG PO TABS
50.0000 ug | ORAL_TABLET | Freq: Every day | ORAL | Status: DC
  Administered 2019-01-14 – 2019-01-17 (×4): 50 ug via ORAL
  Filled 2019-01-13 (×4): qty 1

## 2019-01-13 MED ORDER — METOPROLOL SUCCINATE ER 25 MG PO TB24
25.0000 mg | ORAL_TABLET | Freq: Every day | ORAL | Status: DC
  Administered 2019-01-13 – 2019-01-16 (×4): 25 mg via ORAL
  Filled 2019-01-13 (×4): qty 1

## 2019-01-13 MED ORDER — FAMOTIDINE IN NACL 20-0.9 MG/50ML-% IV SOLN
20.0000 mg | Freq: Every day | INTRAVENOUS | Status: DC
  Administered 2019-01-13 – 2019-01-15 (×3): 20 mg via INTRAVENOUS
  Filled 2019-01-13 (×4): qty 50

## 2019-01-13 MED ORDER — FAMOTIDINE IN NACL 20-0.9 MG/50ML-% IV SOLN
20.0000 mg | Freq: Two times a day (BID) | INTRAVENOUS | Status: DC
  Filled 2019-01-13: qty 50

## 2019-01-13 NOTE — ED Notes (Signed)
Taken to US at this time. 

## 2019-01-13 NOTE — H&P (Signed)
History and Physical    Robert Rowe KPT:[id number] DOB: March 09, 1918 DOA: 01/12/2019  PCP: Robert Post, MD   Patient coming from: Home   Chief Complaint: Abdominal pain   HPI: Robert Rowe is a 85 y.o. male with medical history significant for hypertension, chronic kidney disease stage III, chronic diastolic CHF, hypothyroidism, and left occipital stroke earlier this month, now presenting to the emergency department for evaluation of severe abdominal pain.  Patient reports that he been doing well since recent discharge from acute rehab, but developed severe pain in the epigastrium shortly after eating dinner.  He had not moved his bowels in a couple days and took 2 stool softeners earlier in the day.  Shortly after dinner, he developed acute pain in the epigastrium, described as severe and cramping.  EMS was called, the patient took a nitroglycerin, pain eventually eased off, and he sent EMS away.  Pain then returned, again severe, now more localized to the right upper quadrant, and without any improvement after 2 more nitroglycerin.  He denies any shortness of breath, cough, fevers, chills, or lower extremity swelling.  ED Course: Upon arrival to the ED, patient is found to be saturating well on room air, and with vitals otherwise stable.  EKG features a sinus rhythm with PAC.  CT abdomen and pelvis is negative for acute intra-abdominal or pelvic abnormality but notable for large hiatal hernia with distal esophageal thickening as well as ectatic distal abdominal aorta with ultrasound recommended in 5 years.  Right upper quadrant ultrasound is notable for slightly thickened gallbladder wall which is nonspecific and could be secondary to acute or chronic cholecystitis, liver disease, or edema.  Chemistry panel is notable for creatinine 1.55, similar to priors.  CBC features a slight leukocytosis to 11,400.  Troponin was undetectable and urinalysis unremarkable.  Patient was given IV fluids and  multiple doses of fentanyl in the ED but continues to complain of pain.  General surgery was consulted by the ED physician and medical admission with HIDA scan was recommended.  Review of Systems:  All other systems reviewed and apart from HPI, are negative.  Past Medical History:  Diagnosis Date  . BENIGN PROSTATIC HYPERTROPHY 04/05/2007  . 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 CHEST DISCOMFORT 04/25/2009  . Chronic diastolic CHF (congestive heart failure) ([place])    a. Echo 12/16: mild LVH, EF 55-60%, no RWMA, Gr 1 DD, mild AI, MAC, mild LAE, normal RVF, PASP 37 mmHg  . Chronic kidney disease    chronic   . DJD (degenerative joint disease)   . Dysrhythmia    pvc's   . GERD 12/03/2007  . Hiatal hernia   . History of nuclear stress test    Myoview 1/17: EF 53%, normal perfusion; Low Risk  . HYPERLIPIDEMIA 04/05/2007  . Hypothyroidism   . Osteoarth NOS-Unspec 04/05/2007  . Palpitations 02/12/2010  . PEDAL EDEMA 05/01/2008  . Rheumatoid arthritis(714.0) 04/05/2007  . Shortness of breath dyspnea   . TRANSIENT ISCHEMIC ATTACK, HX OF 04/05/2007   ?     Past Surgical History:  Procedure Laterality Date  . BALLOON DILATION N/A 03/12/2013   Procedure: BALLOON DILATION;  Surgeon: Inda Castle, MD;  Location: [facility];  Service: Endoscopy;  Laterality: N/A;  . CARPAL TUNNEL RELEASE  11/28/2018   Left hand  . CATARACT EXTRACTION  2001   bilateral  . CYSTOSCOPY WITH URETHRAL DILATATION N/A 09/24/2015   Procedure: CYSTOSCOPY WITH COOK BALLOON  DILATATION OF URETHRAL STRICTURE ;  Surgeon: Carolan Clines, MD;  Location: [facility];  Service: Urology;  Laterality: N/A;  . CYSTOSCOPY WITH URETHRAL DILATATION N/A 07/20/2016   Procedure: CYSTOSCOPY WITH URETHRAL DILATATION;  Surgeon: Alexis Frock, MD;  Location: [facility];  Service: Urology;  Laterality: N/A;  1 HOUR  . ESOPHAGOGASTRODUODENOSCOPY N/A 03/12/2013   Procedure: ESOPHAGOGASTRODUODENOSCOPY (EGD);  Surgeon: Inda Castle, MD;  Location: [facility];  Service: Endoscopy;  Laterality: N/A;  . HEMORRHOID SURGERY    . HERNIA REPAIR     inguinal x5  . KNEE SURGERY  arthroscopic   left  . PROSTATE CRYOABLATION     2000  . SHOULDER SURGERY  few years ago   left  . TRANSURETHRAL RESECTION OF PROSTATE N/A 03/19/2015   Procedure: TRANSURETHRAL RESECTION OF THE PROSTATE (TURP);  Surgeon: Carolan Clines, MD;  Location: [facility];  Service: Urology;  Laterality: N/A;     reports that he quit smoking about 65 years ago. His smoking use included cigarettes. He has never used smokeless tobacco. He reports current alcohol use. He reports that he does not use drugs.  Allergies  Allergen Reactions  . Codeine Other (See Comments)    Patient was "all over the place"- undesired side effect  . Sulfasalazine Other (See Comments)    Reaction not recalled  . Sulfonamide Derivatives Other (See Comments)    Reaction not recalled    Family History  Problem Relation Age of Onset  . Breast cancer Mother   . Heart disease Father   . Heart attack Father   . Lymphoma Brother   . Stroke Neg Hx   . Hypertension Neg Hx      Prior to Admission medications   Medication Sig Start Date End Date Taking? Authorizing Provider  acetaminophen (TYLENOL) 325 MG tablet Take 2 tablets (650 mg total) by mouth every 6 (six) hours as needed for mild pain (or Fever >/= 101). 01/10/19  Yes Angiulli, Lavon Paganini, PA-C  aspirin EC 81 MG EC tablet Take 1 tablet (81 mg total) by mouth daily for 30 days. 01/03/19 02/02/19 Yes Arrien, Jimmy Picket, MD  atorvastatin (LIPITOR) 80 MG tablet Take 1 tablet (80 mg total) by mouth daily at 6 PM for 30 days. 01/10/19 02/09/19 Yes Angiulli, Lavon Paganini, PA-C  clopidogrel (PLAVIX) 75 MG tablet Take 1 tablet (75 mg total) by mouth daily. 01/10/19  Yes Angiulli, Lavon Paganini, PA-C  furosemide (LASIX) 20 MG tablet TAKE 1 TABLET BY MOUTH  DAILY AS NEEDED FOR FLUID  OR EDEMA. Patient taking differently: Take 20-40 mg  by mouth daily as needed (for fluid retention in the legs).  07/02/18  Yes Hilty, Nadean Corwin, MD  latanoprost (XALATAN) 0.005 % ophthalmic solution Place 1 drop into both eyes at bedtime.  02/08/18  Yes [provider]  levothyroxine (SYNTHROID, LEVOTHROID) 50 MCG tablet TAKE 1 TABLET BY MOUTH  DAILY Patient taking differently: Take 50 mcg by mouth daily before breakfast.  01/10/19  Yes Angiulli, Lavon Paganini, PA-C  meclizine (ANTIVERT) 12.5 MG tablet Take 1 tablet (12.5 mg total) by mouth 3 (three) times daily as needed for dizziness. 01/10/19  Yes Angiulli, Lavon Paganini, PA-C  metoprolol succinate (TOPROL-XL) 25 MG 24 hr tablet Take 1 tablet (25 mg total) by mouth at bedtime. 01/10/19  Yes Angiulli, Lavon Paganini, PA-C  Multiple Vitamin (MULTIVITAMIN WITH MINERALS) TABS tablet Take 1 tablet by mouth 2 (two) times a week.    Yes [provider]  nitroGLYCERIN (  NITROSTAT) 0.4 MG SL tablet Place 0.4 mg under the tongue every 5 (five) minutes as needed (for Esophageal spasms).    Yes [provider]  pantoprazole (PROTONIX) 40 MG tablet Take 1 tablet (40 mg total) by mouth daily. 01/10/19  Yes Angiulli, Lavon Paganini, PA-C  albuterol (PROVENTIL HFA;VENTOLIN HFA) 108 (90 Base) MCG/ACT inhaler Inhale 2 puffs into the lungs every 4 (four) hours as needed for wheezing or shortness of breath. Patient not taking: Reported on 12/31/2018 11/02/18   Laurey Morale, MD    Physical Exam: Vitals:   01/13/19 0030 01/13/19 0100 01/13/19 0200 01/13/19 0300  BP: (!) 170/88 (!) 165/89 (!) 169/90   Pulse: 91 93  (!) 102  Resp: 17 20 (!) 26 (!) 21  SpO2: 92% 93%  95%    Constitutional: NAD, calm  Eyes: PERTLA, lids and conjunctivae normal ENMT: Mucous membranes are moist. Posterior pharynx clear of any exudate or lesions.   Neck: normal, supple, no masses, no thyromegaly Respiratory: clear to auscultation bilaterally, no wheezing, no crackles. Normal respiratory effort.   Cardiovascular: S1 & S2 heard,  regular rate and rhythm. No extremity edema.   Abdomen: No distension, soft, tender on the right, no rebound pain or guarding. Bowel sounds active.  Musculoskeletal: no clubbing / cyanosis. No joint deformity upper and lower extremities.    Skin: no significant rashes, lesions, ulcers. Warm, dry, well-perfused. Neurologic: no gross facial asymmetry. Sensation intact. Moving all extremities.  Psychiatric: Alert and oriented to person, place, and situation. Pleasant, cooperative.    Labs on Admission: I have personally reviewed following labs and imaging studies  CBC: Recent Labs  Lab 01/12/19 2205  WBC 11.4*  HGB 13.8  HCT 43.5  MCV 98.0  PLT 448   Basic Metabolic Panel: Recent Labs  Lab 01/12/19 2205  NA 139  K 4.0  CL 106  CO2 24  GLUCOSE 155*  BUN 18  CREATININE 1.55*  CALCIUM 8.7*   GFR: Estimated Creatinine Clearance: 20.5 mL/min (A) (by C-G formula based on SCr of 1.55 mg/dL (H)). Liver Function Tests: Recent Labs  Lab 01/12/19 2205  AST 17  ALT 14  ALKPHOS 78  BILITOT 0.6  PROT 6.2*  ALBUMIN 3.4*   Recent Labs  Lab 01/12/19 2205  LIPASE 42   No results for input(s): AMMONIA in the last 168 hours. Coagulation Profile: No results for input(s): INR, PROTIME in the last 168 hours. Cardiac Enzymes: Recent Labs  Lab 01/12/19 2205  TROPONINI <0.03   BNP (last 3 results) No results for input(s): PROBNP in the last 8760 hours. HbA1C: No results for input(s): HGBA1C in the last 72 hours. CBG: No results for input(s): GLUCAP in the last 168 hours. Lipid Profile: No results for input(s): CHOL, HDL, LDLCALC, TRIG, CHOLHDL, LDLDIRECT in the last 72 hours. Thyroid Function Tests: No results for input(s): TSH, T4TOTAL, FREET4, T3FREE, THYROIDAB in the last 72 hours. Anemia Panel: No results for input(s): VITAMINB12, FOLATE, FERRITIN, TIBC, IRON, RETICCTPCT in the last 72 hours. Urine analysis:    Component Value Date/Time   COLORURINE YELLOW 01/13/2019  0027   APPEARANCEUR CLEAR 01/13/2019 0027   LABSPEC 1.025 01/13/2019 0027   PHURINE 6.0 01/13/2019 0027   GLUCOSEU NEGATIVE 01/13/2019 0027   HGBUR NEGATIVE 01/13/2019 0027   BILIRUBINUR NEGATIVE 01/13/2019 0027   KETONESUR NEGATIVE 01/13/2019 0027   PROTEINUR NEGATIVE 01/13/2019 0027   UROBILINOGEN 0.2 08/27/2015 1930   NITRITE NEGATIVE 01/13/2019 0027   LEUKOCYTESUR NEGATIVE 01/13/2019 0027  Sepsis Labs: @LABRCNTIP (procalcitonin:4,lacticidven:4) )No results found for this or any previous visit (from the past 240 hour(s)).   Radiological Exams on Admission: Ct Abdomen Pelvis W Contrast  Result Date: 01/12/2019 CLINICAL DATA:  Right upper quadrant pain EXAM: CT ABDOMEN AND PELVIS WITH CONTRAST TECHNIQUE: Multidetector CT imaging of the abdomen and pelvis was performed using the standard protocol following bolus administration of intravenous contrast. CONTRAST:  63m OMNIPAQUE IOHEXOL 300 MG/ML  SOLN COMPARISON:  Ultrasound 05/14/2018 FINDINGS: Lower chest: Lung bases demonstrate no acute consolidation or effusion. Atelectasis at the left base. Heart size within normal limits. Large hiatal hernia. Distal esophageal thickening. Hepatobiliary: No focal liver abnormality is seen. No gallstones, gallbladder wall thickening, or biliary dilatation. Pancreas: Unremarkable. No pancreatic ductal dilatation or surrounding inflammatory changes. Spleen: Normal in size without focal abnormality. Adrenals/Urinary Tract: Adrenal glands are normal. No hydronephrosis. Cysts within the bilateral kidneys. Bladder is normal Stomach/Bowel: The stomach is nonenlarged. No dilated small bowel. Descending and sigmoid colon diverticular disease without acute inflammatory change. Vascular/Lymphatic: Ectatic distal infrarenal abdominal aorta up to 2.9 cm. Moderate aortic atherosclerosis. No significantly enlarged lymph nodes. Reproductive: Rowe TURP changes of enlarged prostate. Other: Negative for free air or free fluid.  Fat and small calcification in the umbilical region with tiny fluid. Musculoskeletal: Degenerative changes. No acute or suspicious osseous abnormality. IMPRESSION: 1. No CT evidence for acute intra-abdominal or pelvic abnormality. 2. Large hiatal hernia. Distal esophageal thickening, possible esophagitis or reflux 3. Extensive left colon diverticular disease without acute inflammatory change 4. Ectatic distal infrarenal abdominal aorta up to 2.9 cm. Ectatic abdominal aorta at risk for aneurysm development. Recommend followup by ultrasound in 5 years. This recommendation follows ACR consensus guidelines: White Paper of the ACR Incidental Findings Committee II on Vascular Findings. J Am Coll Radiol 2013; 10:789-794. Electronically Signed   By: KDonavan FoilM.D.   On: 01/12/2019 23:39   [place]domen Limited  Result Date: 01/13/2019 CLINICAL DATA:  Epigastric pain EXAM: ULTRASOUND ABDOMEN LIMITED RIGHT UPPER QUADRANT COMPARISON:  CT 01/12/2019 FINDINGS: Gallbladder: No shadowing stones or sludge. Increased wall thickness at 4.1 mm. Negative sonographic Murphy. Common bile duct: Diameter: Poorly visualized, about 2.2 mm Liver: No focal lesion identified. Within normal limits in parenchymal echogenicity. Portal vein is patent on color Doppler imaging with normal direction of blood flow towards the liver. IMPRESSION: No shadowing stones. Slightly thickened gallbladder wall, nonspecific finding, could be secondary to acute or chronic cholecystitis, liver disease, or edema forming states. If acute gallbladder disease remains a concern, further evaluation with hepatobiliary nuclear medicine study could be obtained. Electronically Signed   By: KDonavan FoilM.D.   On: 01/13/2019 01:43    EKG: Independently reviewed. Sinus rhythm, PAC.   Assessment/Plan   1. Intractable RUQ pain  - Presents with acute-onset of epigastric pain, later localizing to RUQ  - No acute findings on CT abd/pelvis; LFT's and lipase normal;  RUQ [place] slight non-specific thickening gallbladder wall - He was treated with IVF and analgesia in ED  - Surgery recommended further evaluation with HIDA scan  - Continue pain-control and bowel rest, repeat CMP in am, check HIDA scan    2. CKD stage III  - SCr is 1.55 on admission, similar to priors  - Renally-dose medications, avoid nephrotoxins   3. History of CVA  - Presented with vertigo earlier this month and found to have acute left occipital CVA  - He is to continue DAPT for 3 months, followed by Plavix only, and  will continue on high-intensity statin    4. Chronic diastolic CHF  - Appears euvolemic  - continue gentle IVF hydration while NPO  - Follow daily wt    DVT prophylaxis: sq heparin  Code Status: Full  Family Communication: Family updated at bedside  Consults called: Surgery consulted by ED physician  Admission status: Observation     Vianne Bulls, MD [facility] Pager [phone]  If 7PM-7AM, please contact night-coverage [email or web address] Password [facility]  01/13/2019, 3:41 AM

## 2019-01-13 NOTE — Progress Notes (Signed)
Patient arrived to [place] from ED. Alert and oriented x4. Family at bedside.

## 2019-01-13 NOTE — ED Notes (Signed)
Pt back from US

## 2019-01-13 NOTE — Consult Note (Signed)
Reason for Consult:Abdominal pain Referring Physician: Marlowe Lawes is an 85 y.o. male.  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  The patient had been doing well and was discharged from rehab to independent living.  He developed severe crampy abdominal pain after eating dinner.  He called EMS.  He also took nitroglycerin.  By the time that EMS got there his pain had resolved and so he sent them away.  He tried taking some stool softeners.  The pain came back 2 hours later and he called EMS and went to the emergency department.  He denies nausea and vomiting.  The pain did localize more to the right upper quadrant.  ER work-up consisted of CT and right upper quadrant ultrasound as well as labs.  He did not have any elevated liver function tests.  CT showed thickened gallbladder wall.  Ultrasound confirmed this.  There was no evidence of gallstones or sludge.  There was no evidence of dilated biliary tree.  Today he underwent HIDA scan which was positive for cystic duct obstruction.  He denies fever or chills.   Past Medical History:  Diagnosis Date  . BENIGN PROSTATIC HYPERTROPHY 04/05/2007  . 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 CHEST DISCOMFORT 04/25/2009  . Chronic diastolic CHF (congestive heart failure) ([place])    a. Echo 12/16: mild LVH, EF 55-60%, no RWMA, Gr 1 DD, mild AI, MAC, mild LAE, normal RVF, PASP 37 mmHg  . Chronic kidney disease    chronic   . DJD (degenerative joint disease)   . Dysrhythmia    pvc's   . GERD 12/03/2007  . Hiatal hernia   . History of nuclear stress test    Myoview 1/17: EF 53%, normal perfusion; Low Risk  . HYPERLIPIDEMIA 04/05/2007  . Hypothyroidism   . Osteoarth  NOS-Unspec 04/05/2007  . Palpitations 02/12/2010  . PEDAL EDEMA 05/01/2008  . Rheumatoid arthritis(714.0) 04/05/2007  . Shortness of breath dyspnea   . TRANSIENT ISCHEMIC ATTACK, HX OF 04/05/2007   ?     Past Surgical History:  Procedure Laterality Date  . BALLOON DILATION N/A 03/12/2013   Procedure: BALLOON DILATION;  Surgeon: Inda Castle, MD;  Location: [facility];  Service: Endoscopy;  Laterality: N/A;  . CARPAL TUNNEL RELEASE  11/28/2018   Left hand  . CATARACT EXTRACTION  2001   bilateral  . CYSTOSCOPY WITH URETHRAL DILATATION N/A 09/24/2015   Procedure: CYSTOSCOPY WITH COOK BALLOON DILATATION OF URETHRAL STRICTURE ;  Surgeon: Carolan Clines, MD;  Location: [facility];  Service: Urology;  Laterality: N/A;  . CYSTOSCOPY WITH URETHRAL DILATATION N/A 07/20/2016   Procedure: CYSTOSCOPY WITH URETHRAL DILATATION;  Surgeon: Alexis Frock, MD;  Location: [facility];  Service: Urology;  Laterality: N/A;  1 HOUR  . ESOPHAGOGASTRODUODENOSCOPY N/A 03/12/2013   Procedure: ESOPHAGOGASTRODUODENOSCOPY (EGD);  Surgeon: Inda Castle, MD;  Location: [facility];  Service: Endoscopy;  Laterality: N/A;  . HEMORRHOID SURGERY    . HERNIA REPAIR     inguinal x5  . KNEE SURGERY  arthroscopic   left  . PROSTATE CRYOABLATION     2000  . SHOULDER SURGERY  few years ago   left  . TRANSURETHRAL RESECTION OF PROSTATE N/A 03/19/2015  Procedure: TRANSURETHRAL RESECTION OF THE PROSTATE (TURP);  Surgeon: Carolan Clines, MD;  Location: [facility];  Service: Urology;  Laterality: N/A;    Family History  Problem Relation Age of Onset  . Breast cancer Mother   . Heart disease Father   . Heart attack Father   . Lymphoma Brother   . Stroke Neg Hx   . Hypertension Neg Hx     Social History:  reports that he quit smoking about 65 years ago. His smoking use included cigarettes. He has never used smokeless tobacco. He reports current alcohol use. He reports that he does not use drugs.  Allergies:  Allergies   Allergen Reactions  . Codeine Other (See Comments)    Patient was "all over the place"- undesired side effect  . Sulfasalazine Other (See Comments)    Reaction not recalled  . Sulfonamide Derivatives Other (See Comments)    Reaction not recalled    Medications: Current Meds  Medication Sig  . acetaminophen (TYLENOL) 325 MG tablet Take 2 tablets (650 mg total) by mouth every 6 (six) hours as needed for mild pain (or Fever >/= 101).  Marland Kitchen aspirin EC 81 MG EC tablet Take 1 tablet (81 mg total) by mouth daily for 30 days.  Marland Kitchen atorvastatin (LIPITOR) 80 MG tablet Take 1 tablet (80 mg total) by mouth daily at 6 PM for 30 days.  . clopidogrel (PLAVIX) 75 MG tablet Take 1 tablet (75 mg total) by mouth daily.  . furosemide (LASIX) 20 MG tablet TAKE 1 TABLET BY MOUTH  DAILY AS NEEDED FOR FLUID  OR EDEMA. (Patient taking differently: Take 20-40 mg by mouth daily as needed (for fluid retention in the legs). )  . latanoprost (XALATAN) 0.005 % ophthalmic solution Place 1 drop into both eyes at bedtime.   Marland Kitchen levothyroxine (SYNTHROID, LEVOTHROID) 50 MCG tablet TAKE 1 TABLET BY MOUTH  DAILY (Patient taking differently: Take 50 mcg by mouth daily before breakfast. )  . meclizine (ANTIVERT) 12.5 MG tablet Take 1 tablet (12.5 mg total) by mouth 3 (three) times daily as needed for dizziness.  . metoprolol succinate (TOPROL-XL) 25 MG 24 hr tablet Take 1 tablet (25 mg total) by mouth at bedtime.  . Multiple Vitamin (MULTIVITAMIN WITH MINERALS) TABS tablet Take 1 tablet by mouth 2 (two) times a week.   . nitroGLYCERIN (NITROSTAT) 0.4 MG SL tablet Place 0.4 mg under the tongue every 5 (five) minutes as needed (for Esophageal spasms).   . pantoprazole (PROTONIX) 40 MG tablet Take 1 tablet (40 mg total) by mouth daily.     Results for orders placed or performed during the hospital encounter of 01/12/19 (from the past 48 hour(s))  Lipase, blood     Status: None   Collection Time: 01/12/19 10:05 PM  Result Value Ref  Range   Lipase 42 11 - 51 U/L    Comment: Performed at [facility], [address], [place], [place] [address]  Comprehensive metabolic panel     Status: Abnormal   Collection Time: 01/12/19 10:05 PM  Result Value Ref Range   Sodium 139 135 - 145 mmol/L   Potassium 4.0 3.5 - 5.1 mmol/L   Chloride 106 98 - 111 mmol/L   CO2 24 22 - 32 mmol/L   Glucose, Bld 155 (H) 70 - 99 mg/dL   BUN 18 8 - 23 mg/dL   Creatinine, Ser 1.55 (H) 0.61 - 1.24 mg/dL   Calcium 8.7 (L) 8.9 - 10.3 mg/dL  Total Protein 6.2 (L) 6.5 - 8.1 g/dL   Albumin 3.4 (L) 3.5 - 5.0 g/dL   AST 17 15 - 41 U/L   ALT 14 0 - 44 U/L   Alkaline Phosphatase 78 38 - 126 U/L   Total Bilirubin 0.6 0.3 - 1.2 mg/dL   GFR calc non Af Amer 37 (L) >60 mL/min   GFR calc Af Amer 43 (L) >60 mL/min   Anion gap 9 5 - 15    Comment: Performed at [facility] [address], [place], [place] [address]  CBC     Status: Abnormal   Collection Time: 01/12/19 10:05 PM  Result Value Ref Range   WBC 11.4 (H) 4.0 - 10.5 K/uL   RBC 4.44 4.22 - 5.81 MIL/uL   Hemoglobin 13.8 13.0 - 17.0 g/dL   HCT 43.5 39.0 - 52.0 %   MCV 98.0 80.0 - 100.0 fL   MCH 31.1 26.0 - 34.0 pg   MCHC 31.7 30.0 - 36.0 g/dL   RDW 12.7 11.5 - 15.5 %   Platelets 248 150 - 400 K/uL   nRBC 0.0 0.0 - 0.2 %    Comment: Performed at [facility], [place] [address] [place], [place] [address]  Troponin I - ONCE - STAT     Status: None   Collection Time: 01/12/19 10:05 PM  Result Value Ref Range   Troponin I <0.03 <0.03 ng/mL    Comment: Performed at [facility], [place] [address] [place], [place] [address]  Urinalysis, Routine w reflex microscopic     Status: None   Collection Time: 01/13/19 12:27 AM  Result Value Ref Range   Color, Urine YELLOW YELLOW   APPearance CLEAR CLEAR   Specific Gravity, Urine 1.025 1.005 - 1.030   pH 6.0 5.0 - 8.0   Glucose, UA NEGATIVE NEGATIVE mg/dL   Hgb urine dipstick NEGATIVE NEGATIVE   Bilirubin Urine NEGATIVE NEGATIVE    Ketones, ur NEGATIVE NEGATIVE mg/dL   Protein, ur NEGATIVE NEGATIVE mg/dL   Nitrite NEGATIVE NEGATIVE   Leukocytes,Ua NEGATIVE NEGATIVE    Comment: Performed at [facility] [address], [place], [place] [address]  Troponin I - ONCE - STAT     Status: None   Collection Time: 01/13/19  3:03 AM  Result Value Ref Range   Troponin I <0.03 <0.03 ng/mL    Comment: Performed at [facility] [address], [place], [place] [address]  Lactic acid, plasma     Status: None   Collection Time: 01/13/19  3:03 AM  Result Value Ref Range   Lactic Acid, Venous 1.2 0.5 - 1.9 mmol/L    Comment: Performed at [facility] [address], [place], [place] [address]  Comprehensive metabolic panel     Status: Abnormal   Collection Time: 01/13/19  5:52 AM  Result Value Ref Range   Sodium 140 135 - 145 mmol/L   Potassium 3.7 3.5 - 5.1 mmol/L   Chloride 104 98 - 111 mmol/L   CO2 21 (L) 22 - 32 mmol/L   Glucose, Bld 154 (H) 70 - 99 mg/dL   BUN 15 8 - 23 mg/dL   Creatinine, Ser 1.40 (H) 0.61 - 1.24 mg/dL   Calcium 8.6 (L) 8.9 - 10.3 mg/dL   Total Protein 6.4 (L) 6.5 - 8.1 g/dL   Albumin 3.4 (L) 3.5 - 5.0 g/dL   AST 19 15 - 41 U/L   ALT  15 0 - 44 U/L   Alkaline Phosphatase 77 38 - 126 U/L   Total Bilirubin 0.6 0.3 - 1.2 mg/dL   GFR calc non Af Amer 42 (L) >60 mL/min   GFR calc Af Amer 48 (L) >60 mL/min   Anion gap 15 5 - 15    Comment: Performed at [facility] [address] [place], [place] [address]  CBC WITH DIFFERENTIAL     Status: Abnormal   Collection Time: 01/13/19  5:52 AM  Result Value Ref Range   WBC 11.5 (H) 4.0 - 10.5 K/uL   RBC 4.32 4.22 - 5.81 MIL/uL   Hemoglobin 13.5 13.0 - 17.0 g/dL   HCT 40.9 39.0 - 52.0 %   MCV 94.7 80.0 - 100.0 fL   MCH 31.3 26.0 - 34.0 pg   MCHC 33.0 30.0 - 36.0 g/dL   RDW 12.7 11.5 - 15.5 %   Platelets 256 150 - 400 K/uL   nRBC 0.0 0.0 - 0.2 %   Neutrophils Relative % 81 %   Neutro Abs 9.2 (H) 1.7 - 7.7 K/uL   Lymphocytes  Relative 12 %   Lymphs Abs 1.4 0.7 - 4.0 K/uL   Monocytes Relative 7 %   Monocytes Absolute 0.8 0.1 - 1.0 K/uL   Eosinophils Relative 0 %   Eosinophils Absolute 0.0 0.0 - 0.5 K/uL   Basophils Relative 0 %   Basophils Absolute 0.1 0.0 - 0.1 K/uL   Immature Granulocytes 0 %   Abs Immature Granulocytes 0.04 0.00 - 0.07 K/uL    Comment: Performed at [facility], [address] [place], [place] [address]  Glucose, capillary     Status: Abnormal   Collection Time: 01/13/19  8:10 AM  Result Value Ref Range   Glucose-Capillary 139 (H) 70 - 99 mg/dL    Nm Hepatobiliary Liver Func  Result Date: 01/13/2019 CLINICAL DATA:  Patient admitted 01/12/2019 with abdominal pain. EXAM: NUCLEAR MEDICINE HEPATOBILIARY IMAGING TECHNIQUE: Sequential images of the abdomen were obtained out to 60 minutes following intravenous administration of radiopharmaceutical. RADIOPHARMACEUTICALS:  5.41 mCi Tc-84m Choletec IV COMPARISON:  None. FINDINGS: Prompt, homogeneous radiotracer uptake in the liver and flow of radiotracer into bowel. The gallbladder was not visualized 90 minutes. Therefore, 2.7 mg of morphine rib minister diabetes the patient. The patient was imaged for an additional 30 minutes. The gallbladder again was not visualized. IMPRESSION: Nonvisualization of the gallbladder including after administration of morphine consistent with occlusion of the cystic duct. Electronically Signed   By: TInge RiseM.D.   On: 01/13/2019 13:44   Ct Abdomen Pelvis W Contrast  Result Date: 01/12/2019 CLINICAL DATA:  Right upper quadrant pain EXAM: CT ABDOMEN AND PELVIS WITH CONTRAST TECHNIQUE: Multidetector CT imaging of the abdomen and pelvis was performed using the standard protocol following bolus administration of intravenous contrast. CONTRAST:  860mOMNIPAQUE IOHEXOL 300 MG/ML  SOLN COMPARISON:  Ultrasound 05/14/2018 FINDINGS: Lower chest: Lung bases demonstrate no acute consolidation or effusion. Atelectasis at  the left base. Heart size within normal limits. Large hiatal hernia. Distal esophageal thickening. Hepatobiliary: No focal liver abnormality is seen. No gallstones, gallbladder wall thickening, or biliary dilatation. Pancreas: Unremarkable. No pancreatic ductal dilatation or surrounding inflammatory changes. Spleen: Normal in size without focal abnormality. Adrenals/Urinary Tract: Adrenal glands are normal. No hydronephrosis. Cysts within the bilateral kidneys. Bladder is normal Stomach/Bowel: The stomach is nonenlarged. No dilated small bowel. Descending and sigmoid colon diverticular disease without acute inflammatory change. Vascular/Lymphatic:  Ectatic distal infrarenal abdominal aorta up to 2.9 cm. Moderate aortic atherosclerosis. No significantly enlarged lymph nodes. Reproductive: Post TURP changes of enlarged prostate. Other: Negative for free air or free fluid. Fat and small calcification in the umbilical region with tiny fluid. Musculoskeletal: Degenerative changes. No acute or suspicious osseous abnormality. IMPRESSION: 1. No CT evidence for acute intra-abdominal or pelvic abnormality. 2. Large hiatal hernia. Distal esophageal thickening, possible esophagitis or reflux 3. Extensive left colon diverticular disease without acute inflammatory change 4. Ectatic distal infrarenal abdominal aorta up to 2.9 cm. Ectatic abdominal aorta at risk for aneurysm development. Recommend followup by ultrasound in 5 years. This recommendation follows ACR consensus guidelines: White Paper of the ACR Incidental Findings Committee II on Vascular Findings. J Am Coll Radiol 2013; 10:789-794. Electronically Signed   By: Donavan Foil M.D.   On: 01/12/2019 23:39   US Abdomen Limited  Result Date: 01/13/2019 CLINICAL DATA:  Epigastric pain EXAM: ULTRASOUND ABDOMEN LIMITED RIGHT UPPER QUADRANT COMPARISON:  CT 01/12/2019 FINDINGS: Gallbladder: No shadowing stones or sludge. Increased wall thickness at 4.1 mm. Negative sonographic  Murphy. Common bile duct: Diameter: Poorly visualized, about 2.2 mm Liver: No focal lesion identified. Within normal limits in parenchymal echogenicity. Portal vein is patent on color Doppler imaging with normal direction of blood flow towards the liver. IMPRESSION: No shadowing stones. Slightly thickened gallbladder wall, nonspecific finding, could be secondary to acute or chronic cholecystitis, liver disease, or edema forming states. If acute gallbladder disease remains a concern, further evaluation with hepatobiliary nuclear medicine study could be obtained. Electronically Signed   By: Donavan Foil M.D.   On: 01/13/2019 01:43    Review of Systems  Constitutional: Negative.   HENT: Negative.   Eyes: Negative.   Respiratory: Negative.   Cardiovascular: Negative.   Gastrointestinal: Positive for abdominal pain and constipation.  Genitourinary: Negative.   Musculoskeletal: Negative.   Skin: Negative.   Neurological: Negative.   Endo/Heme/Allergies: Negative.   Psychiatric/Behavioral: Negative.       Blood pressure (!) 158/88, pulse (!) 108, temperature 97.7 F (36.5 C), temperature source Oral, resp. rate 20, weight 66.4 kg, SpO2 94 %.   Physical Exam  Constitutional: He appears well-developed and well-nourished. No distress.  HENT:  Head: Normocephalic and atraumatic.  Eyes: Pupils are equal, round, and reactive to light. Conjunctivae are normal. No scleral icterus.  Neck: Neck supple. No tracheal deviation present. No thyromegaly present.  Cardiovascular: Normal rate, regular rhythm, normal heart sounds and intact distal pulses.  Respiratory: Effort normal. No respiratory distress. He has no wheezes. He exhibits no tenderness.  GI: Soft. He exhibits distension (mildly protuberant). He exhibits no mass. There is no abdominal tenderness. There is no rebound and no guarding.  Musculoskeletal:        General: No tenderness, deformity or edema.  Neurological: He is alert. Coordination  normal.  Skin: Skin is warm and dry. He is not diaphoretic.  Chronic sun damage   Psychiatric: He has a normal mood and affect. His behavior is normal. Judgment normal.  Did not want to be redirected to wait for help to get out of bed.        Assessment/Plan: Acalculous cholecystitis Cerebral vascular disease BPPV Remote small punctate infarct Anticoagulation with plavix and aspirin Rheumatoid arthritis CKD  Given improvement in symptoms, will treat with antibiotics at this time due to patient concerns about anesthesia. If he continues to have symptoms, would recommend percutaneous cholecystotomy tube and outpatient management. He has no evidence of  gallstones so he might be successfully treated non operatively +/- tube.    He has severe intracranial stenosis and does have reasonable concerns regarding not having significant swings in his BP.     Discussed with patient and his son.    Stark Klein 01/13/2019, 7:11 PM

## 2019-01-13 NOTE — Progress Notes (Addendum)
Admitted after midnight, please see H&P.  Here with right sided chest/abdominal pain.  Recent CVA.   Here for HIDA scan-- no changes in LFTS, U/S shows GB wall thickening-- no RUQ tenderness -does have large hiatal hernia   Addendum: HIDA with ? occulusion of cystic duct -general surgery consult as well as IR as he is probably more of percutaneous drain candidate -will consult IR  Eulogio Bear DO

## 2019-01-14 ENCOUNTER — Telehealth: Payer: Self-pay | Admitting: Family Medicine

## 2019-01-14 ENCOUNTER — Inpatient Hospital Stay: Payer: Medicare Other | Admitting: Family Medicine

## 2019-01-14 DIAGNOSIS — K810 Acute cholecystitis: Secondary | ICD-10-CM | POA: Diagnosis not present

## 2019-01-14 DIAGNOSIS — K219 Gastro-esophageal reflux disease without esophagitis: Secondary | ICD-10-CM | POA: Diagnosis not present

## 2019-01-14 DIAGNOSIS — K819 Cholecystitis, unspecified: Secondary | ICD-10-CM | POA: Diagnosis not present

## 2019-01-14 DIAGNOSIS — M1990 Unspecified osteoarthritis, unspecified site: Secondary | ICD-10-CM | POA: Diagnosis not present

## 2019-01-14 DIAGNOSIS — K5900 Constipation, unspecified: Secondary | ICD-10-CM | POA: Diagnosis present

## 2019-01-14 DIAGNOSIS — E039 Hypothyroidism, unspecified: Secondary | ICD-10-CM | POA: Diagnosis not present

## 2019-01-14 DIAGNOSIS — E876 Hypokalemia: Secondary | ICD-10-CM | POA: Diagnosis not present

## 2019-01-14 DIAGNOSIS — K820 Obstruction of gallbladder: Secondary | ICD-10-CM | POA: Diagnosis present

## 2019-01-14 DIAGNOSIS — I10 Essential (primary) hypertension: Secondary | ICD-10-CM | POA: Diagnosis not present

## 2019-01-14 DIAGNOSIS — H8110 Benign paroxysmal vertigo, unspecified ear: Secondary | ICD-10-CM | POA: Diagnosis not present

## 2019-01-14 DIAGNOSIS — N400 Enlarged prostate without lower urinary tract symptoms: Secondary | ICD-10-CM | POA: Diagnosis present

## 2019-01-14 DIAGNOSIS — I130 Hypertensive heart and chronic kidney disease with heart failure and stage 1 through stage 4 chronic kidney disease, or unspecified chronic kidney disease: Secondary | ICD-10-CM | POA: Diagnosis not present

## 2019-01-14 DIAGNOSIS — I69398 Other sequelae of cerebral infarction: Secondary | ICD-10-CM | POA: Diagnosis not present

## 2019-01-14 DIAGNOSIS — Z79899 Other long term (current) drug therapy: Secondary | ICD-10-CM | POA: Diagnosis not present

## 2019-01-14 DIAGNOSIS — I5032 Chronic diastolic (congestive) heart failure: Secondary | ICD-10-CM | POA: Diagnosis not present

## 2019-01-14 DIAGNOSIS — K812 Acute cholecystitis with chronic cholecystitis: Secondary | ICD-10-CM | POA: Diagnosis not present

## 2019-01-14 DIAGNOSIS — R109 Unspecified abdominal pain: Secondary | ICD-10-CM | POA: Diagnosis not present

## 2019-01-14 DIAGNOSIS — Z8673 Personal history of transient ischemic attack (TIA), and cerebral infarction without residual deficits: Secondary | ICD-10-CM | POA: Diagnosis not present

## 2019-01-14 DIAGNOSIS — M6281 Muscle weakness (generalized): Secondary | ICD-10-CM | POA: Diagnosis not present

## 2019-01-14 DIAGNOSIS — Z9079 Acquired absence of other genital organ(s): Secondary | ICD-10-CM | POA: Diagnosis not present

## 2019-01-14 DIAGNOSIS — H5462 Unqualified visual loss, left eye, normal vision right eye: Secondary | ICD-10-CM | POA: Diagnosis not present

## 2019-01-14 DIAGNOSIS — E785 Hyperlipidemia, unspecified: Secondary | ICD-10-CM | POA: Diagnosis not present

## 2019-01-14 DIAGNOSIS — N183 Chronic kidney disease, stage 3 (moderate): Secondary | ICD-10-CM | POA: Diagnosis not present

## 2019-01-14 DIAGNOSIS — Z87891 Personal history of nicotine dependence: Secondary | ICD-10-CM | POA: Diagnosis not present

## 2019-01-14 DIAGNOSIS — Z79890 Hormone replacement therapy (postmenopausal): Secondary | ICD-10-CM | POA: Diagnosis not present

## 2019-01-14 DIAGNOSIS — I63532 Cerebral infarction due to unspecified occlusion or stenosis of left posterior cerebral artery: Secondary | ICD-10-CM | POA: Diagnosis not present

## 2019-01-14 DIAGNOSIS — Z7982 Long term (current) use of aspirin: Secondary | ICD-10-CM | POA: Diagnosis not present

## 2019-01-14 DIAGNOSIS — Z7902 Long term (current) use of antithrombotics/antiplatelets: Secondary | ICD-10-CM | POA: Diagnosis not present

## 2019-01-14 DIAGNOSIS — Z8249 Family history of ischemic heart disease and other diseases of the circulatory system: Secondary | ICD-10-CM | POA: Diagnosis not present

## 2019-01-14 DIAGNOSIS — R1011 Right upper quadrant pain: Secondary | ICD-10-CM | POA: Diagnosis present

## 2019-01-14 DIAGNOSIS — M069 Rheumatoid arthritis, unspecified: Secondary | ICD-10-CM | POA: Diagnosis present

## 2019-01-14 LAB — COMPREHENSIVE METABOLIC PANEL
ALT: 19 U/L (ref 0–44)
AST: 25 U/L (ref 15–41)
Albumin: 3.1 g/dL — ABNORMAL LOW (ref 3.5–5.0)
Alkaline Phosphatase: 66 U/L (ref 38–126)
Anion gap: 8 (ref 5–15)
BUN: 14 mg/dL (ref 8–23)
CO2: 24 mmol/L (ref 22–32)
Calcium: 8.7 mg/dL — ABNORMAL LOW (ref 8.9–10.3)
Chloride: 110 mmol/L (ref 98–111)
Creatinine, Ser: 1.59 mg/dL — ABNORMAL HIGH (ref 0.61–1.24)
GFR calc Af Amer: 41 mL/min — ABNORMAL LOW (ref >60)
GFR calc non Af Amer: 36 mL/min — ABNORMAL LOW (ref >60)
Glucose, Bld: 145 mg/dL — ABNORMAL HIGH (ref 70–99)
Potassium: 3.6 mmol/L (ref 3.5–5.1)
Sodium: 142 mmol/L (ref 135–145)
Total Bilirubin: 1 mg/dL (ref 0.3–1.2)
Total Protein: 6 g/dL — ABNORMAL LOW (ref 6.5–8.1)

## 2019-01-14 LAB — GLUCOSE, CAPILLARY: Glucose-Capillary: 138 mg/dL — ABNORMAL HIGH (ref 70–99)

## 2019-01-14 LAB — CBC
HCT: 42.8 % (ref 39.0–52.0)
Hemoglobin: 13.9 g/dL (ref 13.0–17.0)
MCH: 31.4 pg (ref 26.0–34.0)
MCHC: 32.5 g/dL (ref 30.0–36.0)
MCV: 96.8 fL (ref 80.0–100.0)
Platelets: 242 10*3/uL (ref 150–400)
RBC: 4.42 MIL/uL (ref 4.22–5.81)
RDW: 13.2 % (ref 11.5–15.5)
WBC: 22 10*3/uL — ABNORMAL HIGH (ref 4.0–10.5)
nRBC: 0 % (ref 0.0–0.2)

## 2019-01-14 MED ORDER — PIPERACILLIN-TAZOBACTAM 3.375 G IVPB
3.3750 g | Freq: Three times a day (TID) | INTRAVENOUS | Status: DC
  Administered 2019-01-14 – 2019-01-16 (×6): 3.375 g via INTRAVENOUS
  Filled 2019-01-14 (×5): qty 50

## 2019-01-14 MED ORDER — ORAL CARE MOUTH RINSE
15.0000 mL | Freq: Two times a day (BID) | OROMUCOSAL | Status: DC
  Administered 2019-01-14 (×2): 15 mL via OROMUCOSAL

## 2019-01-14 MED ORDER — CHLORHEXIDINE GLUCONATE 0.12 % MT SOLN
15.0000 mL | Freq: Two times a day (BID) | OROMUCOSAL | Status: DC
  Administered 2019-01-14 (×2): 15 mL via OROMUCOSAL
  Filled 2019-01-14 (×2): qty 15

## 2019-01-14 MED ORDER — POLYETHYLENE GLYCOL 3350 17 G PO PACK
17.0000 g | PACK | Freq: Every day | ORAL | Status: DC
  Administered 2019-01-14 – 2019-01-16 (×3): 17 g via ORAL
  Filled 2019-01-14 (×4): qty 1

## 2019-01-14 NOTE — Progress Notes (Signed)
Progress Note    Robert Rowe  DGU:[id number] DOB: January 26, 1918  DOA: 01/12/2019 PCP: [name], MD    Brief Narrative:    Medical records reviewed and are as summarized below:  Robert Rowe is an 85 y.o. male with medical history significant for hypertension, chronic kidney disease stage III, chronic diastolic CHF, hypothyroidism, and left occipital stroke earlier this month (incidental finding), now presenting to the emergency department for evaluation of severe abdominal pain.  Found to have acute cholecystitis.  Assessment/Plan:   Principal Problem:   Intractable abdominal pain Active Problems:   Hypothyroidism   Chronic diastolic CHF (congestive heart failure) (HCC)   Essential hypertension   CKD (chronic kidney disease) stage 3, GFR 30-59 ml/min (HCC)   History of stroke   Intractable RUQ pain due to acute cholecystitis - Presents with acute-onset of epigastric pain, later localizing to RUQ  - RUQ [place] with  thickening gallbladder wall and HIDA shows: block of cystic duct - IV abx -General surgery consult appreciated -IR consult for drain WBC count worsening -holding plavix until after drain   CKD stage III  - SCr is 1.55 on admission, similar to priors  - Renally-dose medications, avoid nephrotoxins   Recent incidental finding of CVA but cause of dizziness was thought to be BPPV - Presented with vertigo earlier this month and found to have acute left occipital CVA  - He is to continue DAPT for 3 months, followed by Plavix only, and will continue on high-intensity statin   -plavix held for now while decision for drain discussed  Chronic diastolic CHF  - Appears euvolemic  - Follow daily wt    Family Communication/Anticipated D/C date and plan/Code Status   DVT prophylaxis: Lovenox ordered. Code Status: Full Code.  Family Communication: wife at bedside Disposition Plan: pending drain decision and IV abx   Medical Consultants:     GS  IR  Subjective:   No pain after eating  Objective:    Vitals:   01/13/19 0458 01/13/19 1341 01/13/19 2347 01/14/19 0435  BP: (!) 180/102 (!) 158/88 110/63 133/70  Pulse: 97 (!) 108 (!) 106 (!) 103  Resp: 18 20    Temp: (!) 97.4 F (36.3 C) 97.7 F (36.5 C) 98.3 F (36.8 C) 98.3 F (36.8 C)  TempSrc: Oral Oral Oral Oral  SpO2: 92% 94% 93% 94%  Weight: 66.4 kg       Intake/Output Summary (Last 24 hours) at 01/14/2019 1041 Last data filed at 01/14/2019 9563 Gross per 24 hour  Intake 393.24 ml  Output 450 ml  Net -56.76 ml   Filed Weights   01/13/19 0458  Weight: 66.4 kg    Exam: In chair, frail appearing rrr No wheezing, no increased work of breathing Pleasantly confused-- asking same question over and over  Data Reviewed:   I have personally reviewed following labs and imaging studies:  Labs: Labs show the following:   Basic Metabolic Panel: Recent Labs  Lab 01/12/19 2205 01/13/19 0552  NA 139 140  K 4.0 3.7  CL 106 104  CO2 24 21*  GLUCOSE 155* 154*  BUN 18 15  CREATININE 1.55* 1.40*  CALCIUM 8.7* 8.6*   GFR Estimated Creatinine Clearance: 24.7 mL/min (A) (by C-G formula based on SCr of 1.4 mg/dL (H)). Liver Function Tests: Recent Labs  Lab 01/12/19 2205 01/13/19 0552  AST 17 19  ALT 14 15  ALKPHOS 78 77  BILITOT 0.6 0.6  PROT 6.2* 6.4*  ALBUMIN 3.4* 3.4*   Recent Labs  Lab 01/12/19 2205  LIPASE 42   No results for input(s): AMMONIA in the last 168 hours. Coagulation profile No results for input(s): INR, PROTIME in the last 168 hours.  CBC: Recent Labs  Lab 01/12/19 2205 01/13/19 0552 01/14/19 0941  WBC 11.4* 11.5* 22.0*  NEUTROABS  --  9.2*  --   HGB 13.8 13.5 13.9  HCT 43.5 40.9 42.8  MCV 98.0 94.7 96.8  PLT 248 256 242   Cardiac Enzymes: Recent Labs  Lab 01/12/19 2205 01/13/19 0303  TROPONINI <0.03 <0.03   BNP (last 3 results) No results for input(s): PROBNP in the last 8760 hours. CBG: Recent  Labs  Lab 01/13/19 0810 01/14/19 0814  GLUCAP 139* 138*   D-Dimer: No results for input(s): DDIMER in the last 72 hours. Hgb A1c: No results for input(s): HGBA1C in the last 72 hours. Lipid Profile: No results for input(s): CHOL, HDL, LDLCALC, TRIG, CHOLHDL, LDLDIRECT in the last 72 hours. Thyroid function studies: No results for input(s): TSH, T4TOTAL, T3FREE, THYROIDAB in the last 72 hours.  Invalid input(s): FREET3 Anemia work up: No results for input(s): VITAMINB12, FOLATE, FERRITIN, TIBC, IRON, RETICCTPCT in the last 72 hours. Sepsis Labs: Recent Labs  Lab 01/12/19 2205 01/13/19 0303 01/13/19 0552 01/14/19 0941  WBC 11.4*  --  11.5* 22.0*  LATICACIDVEN  --  1.2  --   --     Microbiology No results found for this or any previous visit (from the past 240 hour(s)).  Procedures and diagnostic studies:  Nm Hepatobiliary Liver Func  Result Date: 01/13/2019 CLINICAL DATA:  Patient admitted 01/12/2019 with abdominal pain. EXAM: NUCLEAR MEDICINE HEPATOBILIARY IMAGING TECHNIQUE: Sequential images of the abdomen were obtained out to 60 minutes following intravenous administration of radiopharmaceutical. RADIOPHARMACEUTICALS:  5.41 mCi Tc-7m  Choletec IV COMPARISON:  None. FINDINGS: Prompt, homogeneous radiotracer uptake in the liver and flow of radiotracer into bowel. The gallbladder was not visualized 90 minutes. Therefore, 2.7 mg of morphine rib minister diabetes the patient. The patient was imaged for an additional 30 minutes. The gallbladder again was not visualized. IMPRESSION: Nonvisualization of the gallbladder including after administration of morphine consistent with occlusion of the cystic duct. Electronically Signed   By: Inge Rise M.D.   On: 01/13/2019 13:44   Ct Abdomen Pelvis W Contrast  Result Date: 01/12/2019 CLINICAL DATA:  Right upper quadrant pain EXAM: CT ABDOMEN AND PELVIS WITH CONTRAST TECHNIQUE: Multidetector CT imaging of the abdomen and pelvis was  performed using the standard protocol following bolus administration of intravenous contrast. CONTRAST:  50mL OMNIPAQUE IOHEXOL 300 MG/ML  SOLN COMPARISON:  Ultrasound 05/14/2018 FINDINGS: Lower chest: Lung bases demonstrate no acute consolidation or effusion. Atelectasis at the left base. Heart size within normal limits. Large hiatal hernia. Distal esophageal thickening. Hepatobiliary: No focal liver abnormality is seen. No gallstones, gallbladder wall thickening, or biliary dilatation. Pancreas: Unremarkable. No pancreatic ductal dilatation or surrounding inflammatory changes. Spleen: Normal in size without focal abnormality. Adrenals/Urinary Tract: Adrenal glands are normal. No hydronephrosis. Cysts within the bilateral kidneys. Bladder is normal Stomach/Bowel: The stomach is nonenlarged. No dilated small bowel. Descending and sigmoid colon diverticular disease without acute inflammatory change. Vascular/Lymphatic: Ectatic distal infrarenal abdominal aorta up to 2.9 cm. Moderate aortic atherosclerosis. No significantly enlarged lymph nodes. Reproductive: Post TURP changes of enlarged prostate. Other: Negative for free air or free fluid. Fat and small calcification in the umbilical region with tiny fluid. Musculoskeletal: Degenerative changes. No acute or  suspicious osseous abnormality. IMPRESSION: 1. No CT evidence for acute intra-abdominal or pelvic abnormality. 2. Large hiatal hernia. Distal esophageal thickening, possible esophagitis or reflux 3. Extensive left colon diverticular disease without acute inflammatory change 4. Ectatic distal infrarenal abdominal aorta up to 2.9 cm. Ectatic abdominal aorta at risk for aneurysm development. Recommend followup by ultrasound in 5 years. This recommendation follows ACR consensus guidelines: White Paper of the ACR Incidental Findings Committee II on Vascular Findings. J Am Coll Radiol 2013; 10:789-794. Electronically Signed   By: Donavan Foil M.D.   On: 01/12/2019  23:39   US Abdomen Limited  Result Date: 01/13/2019 CLINICAL DATA:  Epigastric pain EXAM: ULTRASOUND ABDOMEN LIMITED RIGHT UPPER QUADRANT COMPARISON:  CT 01/12/2019 FINDINGS: Gallbladder: No shadowing stones or sludge. Increased wall thickness at 4.1 mm. Negative sonographic Murphy. Common bile duct: Diameter: Poorly visualized, about 2.2 mm Liver: No focal lesion identified. Within normal limits in parenchymal echogenicity. Portal vein is patent on color Doppler imaging with normal direction of blood flow towards the liver. IMPRESSION: No shadowing stones. Slightly thickened gallbladder wall, nonspecific finding, could be secondary to acute or chronic cholecystitis, liver disease, or edema forming states. If acute gallbladder disease remains a concern, further evaluation with hepatobiliary nuclear medicine study could be obtained. Electronically Signed   By: Donavan Foil M.D.   On: 01/13/2019 01:43    Medications:   . aspirin EC  81 mg Oral Daily  . chlorhexidine  15 mL Mouth Rinse BID  . heparin  5,000 Units Subcutaneous Q8H  . latanoprost  1 drop Both Eyes QHS  . levothyroxine  50 mcg Oral QAC breakfast  . mouth rinse  15 mL Mouth Rinse q12n4p  . metoprolol succinate  25 mg Oral QHS   Continuous Infusions: . cefTRIAXone (ROCEPHIN)  IV 2 g (01/13/19 1635)  . famotidine (PEPCID) IV 20 mg (01/13/19 1404)     LOS: 0 days   Geradine Girt  [facility]   How to contact the [facility] Attending or Consulting provider [place] or covering provider during after hours [place], for this patient?  1. Check the care team in [facility] and look for a) attending/consulting TR[facility] provider listed and b) the [facility] team listed 2. Log into [email or web address] and use [place]'s universal password to access. If you do not have the password, please contact the hospital operator. 3. Locate the [facility] provider you are looking for under [facility] and page to a number that you can be directly reached. 4. If you  still have difficulty reaching the provider, please page the [facility] (Director on Call) for the Hospitalists listed on amion for assistance.  01/14/2019, 10:41 AM

## 2019-01-14 NOTE — Telephone Encounter (Signed)
Otho Bellows with [facility] and gave her the verbal OK per Dr. Elease Hashimoto. Manuela Schwartz verbalized an understanding.

## 2019-01-14 NOTE — Telephone Encounter (Signed)
Copied from [place] [id number]. Topic: Quick Communication - Home Health Verbal Orders >> Jan 14, 2019  9:24 AM Jeri Cos wrote: Caller/Agency: [place] Number: [phone] Requesting OT/PT/Skilled Nursing/Social Work: Home Health OT  Frequency: 1x 1wk / 2x 1 wk / 1x 1wk

## 2019-01-14 NOTE — Progress Notes (Signed)
[facility] Progress Note     Subjective: CC: acalculous cholecystitis Patient denies abdominal pain currently or nausea. Wants to eat. Seen while IR in the room as well and patient prefers to proceed with least invasive treatment possible.  Wife and son at bedside and wanted to make sure we knew that neurology does not feel he had a stroke 2 weeks ago - imaging stable with previous imaging 20 years ago.   Objective: Vital signs in last 24 hours: Temp:  [97.7 F (36.5 C)-98.3 F (36.8 C)] 98.3 F (36.8 C) (02/24 0435) Pulse Rate:  [103-108] 103 (02/24 0435) Resp:  [20] 20 (02/23 1341) BP: (110-158)/(63-88) 133/70 (02/24 0435) SpO2:  [93 %-94 %] 94 % (02/24 0435) Last BM Date: 01/10/19  Intake/Output from previous day: 02/23 0701 - 02/24 0700 In: 393.2 [I.V.:193.2; IV Piggyback:200] Out: 800 [Urine:800] Intake/Output this shift: No intake/output data recorded.  PE: Gen:  Alert, NAD, pleasant Card:  Regular rate and rhythm Pulm:  Normal effort, clear to auscultation bilaterally Abd: Soft, non-tender, moderately distended, +BS, no HSM Skin: warm and dry, no rashes  Psych: A&Ox3   Lab Results:  Recent Labs    01/12/19 2205 01/13/19 0552  WBC 11.4* 11.5*  HGB 13.8 13.5  HCT 43.5 40.9  PLT 248 256   BMET Recent Labs    01/12/19 2205 01/13/19 0552  NA 139 140  K 4.0 3.7  CL 106 104  CO2 24 21*  GLUCOSE 155* 154*  BUN 18 15  CREATININE 1.55* 1.40*  CALCIUM 8.7* 8.6*   PT/INR No results for input(s): LABPROT, INR in the last 72 hours. CMP     Component Value Date/Time   NA 140 01/13/2019 0552   K 3.7 01/13/2019 0552   CL 104 01/13/2019 0552   CO2 21 (L) 01/13/2019 0552   GLUCOSE 154 (H) 01/13/2019 0552   BUN 15 01/13/2019 0552   CREATININE 1.40 (H) 01/13/2019 0552   CREATININE 1.64 (H) 12/09/2015 1520   CALCIUM 8.6 (L) 01/13/2019 0552   PROT 6.4 (L) 01/13/2019 0552   ALBUMIN 3.4 (L) 01/13/2019 0552   AST 19 01/13/2019 0552   ALT 15  01/13/2019 0552   ALKPHOS 77 01/13/2019 0552   BILITOT 0.6 01/13/2019 0552   GFRNONAA 42 (L) 01/13/2019 0552   GFRAA 48 (L) 01/13/2019 0552   Lipase     Component Value Date/Time   LIPASE 42 01/12/2019 2205       Studies/Results: Nm Hepatobiliary Liver Func  Result Date: 01/13/2019 CLINICAL DATA:  Patient admitted 01/12/2019 with abdominal pain. EXAM: NUCLEAR MEDICINE HEPATOBILIARY IMAGING TECHNIQUE: Sequential images of the abdomen were obtained out to 60 minutes following intravenous administration of radiopharmaceutical. RADIOPHARMACEUTICALS:  5.41 mCi Tc-73m  Choletec IV COMPARISON:  None. FINDINGS: Prompt, homogeneous radiotracer uptake in the liver and flow of radiotracer into bowel. The gallbladder was not visualized 90 minutes. Therefore, 2.7 mg of morphine rib minister diabetes the patient. The patient was imaged for an additional 30 minutes. The gallbladder again was not visualized. IMPRESSION: Nonvisualization of the gallbladder including after administration of morphine consistent with occlusion of the cystic duct. Electronically Signed   By: Inge Rise M.D.   On: 01/13/2019 13:44   Ct Abdomen Pelvis W Contrast  Result Date: 01/12/2019 CLINICAL DATA:  Right upper quadrant pain EXAM: CT ABDOMEN AND PELVIS WITH CONTRAST TECHNIQUE: Multidetector CT imaging of the abdomen and pelvis was performed using the standard protocol following bolus administration of intravenous contrast. CONTRAST:  41mL  OMNIPAQUE IOHEXOL 300 MG/ML  SOLN COMPARISON:  Ultrasound 05/14/2018 FINDINGS: Lower chest: Lung bases demonstrate no acute consolidation or effusion. Atelectasis at the left base. Heart size within normal limits. Large hiatal hernia. Distal esophageal thickening. Hepatobiliary: No focal liver abnormality is seen. No gallstones, gallbladder wall thickening, or biliary dilatation. Pancreas: Unremarkable. No pancreatic ductal dilatation or surrounding inflammatory changes. Spleen: Normal in  size without focal abnormality. Adrenals/Urinary Tract: Adrenal glands are normal. No hydronephrosis. Cysts within the bilateral kidneys. Bladder is normal Stomach/Bowel: The stomach is nonenlarged. No dilated small bowel. Descending and sigmoid colon diverticular disease without acute inflammatory change. Vascular/Lymphatic: Ectatic distal infrarenal abdominal aorta up to 2.9 cm. Moderate aortic atherosclerosis. No significantly enlarged lymph nodes. Reproductive: Post TURP changes of enlarged prostate. Other: Negative for free air or free fluid. Fat and small calcification in the umbilical region with tiny fluid. Musculoskeletal: Degenerative changes. No acute or suspicious osseous abnormality. IMPRESSION: 1. No CT evidence for acute intra-abdominal or pelvic abnormality. 2. Large hiatal hernia. Distal esophageal thickening, possible esophagitis or reflux 3. Extensive left colon diverticular disease without acute inflammatory change 4. Ectatic distal infrarenal abdominal aorta up to 2.9 cm. Ectatic abdominal aorta at risk for aneurysm development. Recommend followup by ultrasound in 5 years. This recommendation follows ACR consensus guidelines: White Paper of the ACR Incidental Findings Committee II on Vascular Findings. J Am Coll Radiol 2013; 10:789-794. Electronically Signed   By: Donavan Foil M.D.   On: 01/12/2019 23:39   US Abdomen Limited  Result Date: 01/13/2019 CLINICAL DATA:  Epigastric pain EXAM: ULTRASOUND ABDOMEN LIMITED RIGHT UPPER QUADRANT COMPARISON:  CT 01/12/2019 FINDINGS: Gallbladder: No shadowing stones or sludge. Increased wall thickness at 4.1 mm. Negative sonographic Murphy. Common bile duct: Diameter: Poorly visualized, about 2.2 mm Liver: No focal lesion identified. Within normal limits in parenchymal echogenicity. Portal vein is patent on color Doppler imaging with normal direction of blood flow towards the liver. IMPRESSION: No shadowing stones. Slightly thickened gallbladder wall,  nonspecific finding, could be secondary to acute or chronic cholecystitis, liver disease, or edema forming states. If acute gallbladder disease remains a concern, further evaluation with hepatobiliary nuclear medicine study could be obtained. Electronically Signed   By: Donavan Foil M.D.   On: 01/13/2019 01:43    Anti-infectives: Anti-infectives (From admission, onward)   Start     Dose/Rate Route Frequency Ordered Stop   01/13/19 1500  cefTRIAXone (ROCEPHIN) 2 g in sodium chloride 0.9 % 100 mL IVPB     2 g 200 mL/hr over 30 Minutes Intravenous Every 24 hours 01/13/19 1417         Assessment/Plan ?Hx of Cerebral vascular disease  Anticoagulated with plavix and ASA RA CKD  BPPV  Acute acalculous cholecystitis - patient clinically improving and wishes to avoid invasive treatments if possible - WBC increased to 22 from 11.5 today, afebrile - will change abx to zosyn - LFTs pending - hopefully this will resolve with conservative measures but patient may require percutaneous cholecystostomy, we will follow and see - can trial CLD and then advance as tolerated today - if patient develops pain or nausea with eating will likely need perc chole drain tomorrow - repeat labs in AM  FEN: CLD and ADAT VTE: SCDs, SQ heparin  ID: rocephin 2/23>2/24, zosyn 2/24>>  LOS: 0 days    Brigid Re , [facility] 01/14/2019, 10:09 AM Pager: [phone] Consults: [phone]

## 2019-01-14 NOTE — Care Management Note (Signed)
Case Management Note  Patient Details  Name: Robert Rowe MRN: [id number] Date of Birth: [birth year]  Subjective/Objective:                    Action/Plan:  Recent discharge from inpatient rehab. Home with wife and [place]   Will need resumption of care orders Expected Discharge Date:                  Expected Discharge Plan:  [place]  In-House Referral:     Discharge planning Services  CM Consult  Post Acute Care Choice:    Choice offered to:  Patient  DME Arranged:    DME Agency:     HH Arranged:    [place] Agency:     Status of Service:  In process, will continue to follow  If discussed at Long Length of Stay Meetings, dates discussed:    Additional Comments:  Marilu Favre, RN 01/14/2019, 10:41 AM

## 2019-01-14 NOTE — Care Management Obs Status (Signed)
[facility] NOTIFICATION   Patient Details  Name: ASHTIN ROSNER MRN: [id number] Date of Birth: 07-Nov-1918   Medicare Observation Status Notification Given:  Yes    Marilu Favre, RN 01/14/2019, 10:41 AM

## 2019-01-14 NOTE — Telephone Encounter (Signed)
Please advise 

## 2019-01-14 NOTE — Telephone Encounter (Signed)
OK 

## 2019-01-14 NOTE — Consult Note (Signed)
Chief Complaint: Patient was seen in consultation today for percutaneous cholecystostomy placement.  Referring Physician(s): Dr. Eulogio Bear  Supervising Physician: Jacqulynn Cadet  Patient Status: [facility] - In-pt  History of Present Illness: Robert Rowe is a 85 y.o. male with a past medical history significant for BPH, DJD, RA, hypothyroidism, HLD, CHF and questionable history of CVA who presented to [facility] ED on 01/12/19 with complaints of RUQ abdominal pain x 3 hours after eating dinner. 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 CT abdomen abd/elvis with contrast was performed which was essentially unremarkable. Limited abdominal US was performed which showed a slightly thickened gallbladder wall. There was no evidence of gallstones, sludge or biliary tree dilatation. Initial labs were essentially within normal limits. HIDA was then performed which showed nonvisualization of the gallbladder including after administration of morphine - consistent with cystic duct occlusion. He was admitted for further evaluation and management. General surgery was consulted and Dr. Barry Dienes saw the patient last night - he reported improvement in his symptoms, as such she recommended antibiotic treatment +/- cholecystostomy placement. Of note, patient expressed that he would not undergo general anesthesia due to severe intracranial stenosis and concerns for lability of his blood pressure. IR has been consulted for possible percutaneous cholecystostomy placement.  Patient seen alongside surgery PA today, wife and son are also present during exam. Patient reports he feels perfectly fine and would like to eat - he states he has not eaten since Saturday. He denies any pain, nausea or vomiting. He again reports  concerns with any type of anesthesia besides local, however he would consider moderate anesthesia. He prefers to proceed with the least invasive treatment possible and if it is felt antibiotics will resolve his acute cholecystitis then he would like to try that first.   Past Medical History:  Diagnosis Date  . BENIGN PROSTATIC HYPERTROPHY 04/05/2007  . 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 CHEST DISCOMFORT 04/25/2009  . Chronic diastolic CHF (congestive heart failure) ([place])    a. Echo 12/16: mild LVH, EF 55-60%, no RWMA, Gr 1 DD, mild AI, MAC, mild LAE, normal RVF, PASP 37 mmHg  . Chronic kidney disease    chronic   . DJD (degenerative joint disease)   . Dysrhythmia    pvc's   . GERD 12/03/2007  . Hiatal hernia   . History of nuclear stress test    Myoview 1/17: EF 53%, normal perfusion; Low Risk  . HYPERLIPIDEMIA 04/05/2007  . Hypothyroidism   . Osteoarth NOS-Unspec 04/05/2007  . Palpitations 02/12/2010  . PEDAL EDEMA 05/01/2008  . Rheumatoid arthritis(714.0) 04/05/2007  . Shortness of breath dyspnea   . TRANSIENT ISCHEMIC ATTACK, HX OF 04/05/2007   ?     Past Surgical History:  Procedure Laterality Date  . BALLOON DILATION N/A 03/12/2013   Procedure: BALLOON DILATION;  Surgeon: Inda Castle, MD;  Location: [facility];  Service: Endoscopy;  Laterality: N/A;  . CARPAL TUNNEL RELEASE  11/28/2018   Left hand  . CATARACT EXTRACTION  2001   bilateral  . CYSTOSCOPY WITH URETHRAL DILATATION N/A 09/24/2015   Procedure: CYSTOSCOPY WITH COOK BALLOON DILATATION OF URETHRAL STRICTURE ;  Surgeon: Carolan Clines, MD;  Location: [facility];  Service: Urology;  Laterality: N/A;  . CYSTOSCOPY WITH URETHRAL DILATATION N/A 07/20/2016   Procedure: CYSTOSCOPY WITH URETHRAL DILATATION;  Surgeon: Alexis Frock, MD;  Location: [facility];  Service: Urology;  Laterality: N/A;  1 HOUR  . ESOPHAGOGASTRODUODENOSCOPY N/A 03/12/2013   Procedure: ESOPHAGOGASTRODUODENOSCOPY (EGD);  Surgeon:  Inda Castle, MD;  Location: [facility];  Service: Endoscopy;  Laterality: N/A;  . HEMORRHOID SURGERY    . HERNIA REPAIR     inguinal x5  . KNEE SURGERY  arthroscopic   left  . PROSTATE CRYOABLATION     2000  . SHOULDER SURGERY  few years ago   left  . TRANSURETHRAL RESECTION OF PROSTATE N/A 03/19/2015   Procedure: TRANSURETHRAL RESECTION OF THE PROSTATE (TURP);  Surgeon: Carolan Clines, MD;  Location: [facility];  Service: Urology;  Laterality: N/A;    Allergies: Codeine; Sulfasalazine; and Sulfonamide derivatives  Medications: Prior to Admission medications   Medication Sig Start Date End Date Taking? Authorizing Provider  acetaminophen (TYLENOL) 325 MG tablet Take 2 tablets (650 mg total) by mouth every 6 (six) hours as needed for mild pain (or Fever >/= 101). 01/10/19  Yes Angiulli, Lavon Paganini, PA-C  aspirin EC 81 MG EC tablet Take 1 tablet (81 mg total) by mouth daily for 30 days. 01/03/19 02/02/19 Yes Arrien, Jimmy Picket, MD  atorvastatin (LIPITOR) 80 MG tablet Take 1 tablet (80 mg total) by mouth daily at 6 PM for 30 days. 01/10/19 02/09/19 Yes Angiulli, Lavon Paganini, PA-C  clopidogrel (PLAVIX) 75 MG tablet Take 1 tablet (75 mg total) by mouth daily. 01/10/19  Yes Angiulli, Lavon Paganini, PA-C  furosemide (LASIX) 20 MG tablet TAKE 1 TABLET BY MOUTH  DAILY AS NEEDED FOR FLUID  OR EDEMA. Patient taking differently: Take 20-40 mg by mouth daily as needed (for fluid retention in the legs).  07/02/18  Yes Hilty, Nadean Corwin, MD  latanoprost (XALATAN) 0.005 % ophthalmic solution Place 1 drop into both eyes at bedtime.  02/08/18  Yes [provider]  levothyroxine (SYNTHROID, LEVOTHROID) 50 MCG tablet TAKE 1 TABLET BY MOUTH  DAILY Patient taking differently: Take 50 mcg by mouth daily before breakfast.  01/10/19  Yes Angiulli, Lavon Paganini, PA-C  meclizine (ANTIVERT) 12.5 MG tablet Take 1 tablet (12.5 mg total) by mouth 3 (three) times daily as needed for dizziness. 01/10/19  Yes Angiulli, Lavon Paganini, PA-C  metoprolol succinate (TOPROL-XL) 25 MG 24 hr tablet Take 1 tablet (25 mg total) by mouth at bedtime. 01/10/19  Yes Angiulli, Lavon Paganini, PA-C  Multiple Vitamin (MULTIVITAMIN WITH MINERALS) TABS tablet Take 1 tablet by mouth 2 (two) times a week.    Yes [provider]  nitroGLYCERIN (NITROSTAT) 0.4 MG SL tablet Place 0.4 mg under the tongue every 5 (five) minutes as needed (for Esophageal spasms).    Yes [provider]  pantoprazole (PROTONIX) 40 MG tablet Take 1 tablet (40 mg total) by mouth daily. 01/10/19  Yes Angiulli, Lavon Paganini, PA-C  albuterol (PROVENTIL HFA;VENTOLIN HFA) 108 (90 Base) MCG/ACT inhaler Inhale 2 puffs into the lungs every 4 (four) hours as needed for wheezing or shortness of breath. Patient not taking: Reported on 12/31/2018 11/02/18   Laurey Morale, MD     Family History  Problem Relation Age of Onset  . Breast cancer Mother   . Heart disease Father   . Heart attack Father   . Lymphoma Brother   . Stroke Neg Hx   . Hypertension Neg Hx     Social History  Socioeconomic History  . Marital status: Married    Spouse name: Not on file  . Number of children: Not on file  . Years of education: Not on file  . Highest education level: Not on file  Occupational History  . Not on file  Social Needs  . Financial resource strain: Not on file  . Food insecurity:    Worry: Not on file    Inability: Not on file  . Transportation needs:    Medical: Not on file    Non-medical: Not on file  Tobacco Use  . Smoking status: Former Smoker    Types: Cigarettes    Last attempt to quit: 08/13/1953    Years since quitting: 65.4  . Smokeless tobacco: Never Used  Substance and Sexual Activity  . Alcohol use: Yes    Comment: occ vodka  . Drug use: No  . Sexual activity: Not on file  Lifestyle  . Physical activity:    Days per week: Not on file    Minutes per session: Not on file  . Stress: Not on file  Relationships  . Social connections:    Talks  on phone: Not on file    Gets together: Not on file    Attends religious service: Not on file    Active member of club or organization: Not on file    Attends meetings of clubs or organizations: Not on file    Relationship status: Not on file  Other Topics Concern  . Not on file  Social History Narrative  . Not on file     Review of Systems: A 12 point ROS discussed and pertinent positives are indicated in the HPI above.  All other systems are negative.  Review of Systems  Constitutional: Negative for chills and fever.  Respiratory: Negative for cough and shortness of breath.   Cardiovascular: Negative for chest pain.  Gastrointestinal: Negative for abdominal pain, diarrhea, nausea and vomiting.  Musculoskeletal: Negative for back pain.  Neurological: Negative for headaches.    Vital Signs: BP 133/70 (BP Location: Right Arm)   Pulse (!) 103   Temp 98.3 F (36.8 C) (Oral)   Resp 20   Wt 146 lb 6.2 oz (66.4 kg)   SpO2 94%   BMI 26.77 kg/m   Physical Exam Vitals signs and nursing note reviewed.  Constitutional:      General: He is not in acute distress. HENT:     Head: Normocephalic.  Eyes:     General: No scleral icterus. Cardiovascular:     Rate and Rhythm: Normal rate and regular rhythm.  Pulmonary:     Effort: Pulmonary effort is normal.     Breath sounds: Normal breath sounds.  Abdominal:     General: There is distension (mild).     Tenderness: There is no abdominal tenderness.     Comments: (+) hernia  Skin:    General: Skin is warm and dry.     Coloration: Skin is not jaundiced.  Neurological:     Mental Status: He is alert and oriented to person, place, and time.  Psychiatric:        Mood and Affect: Mood normal.        Behavior: Behavior normal.        Thought Content: Thought content normal.        Judgment: Judgment normal.      Imaging: Mr [name] AJ Contrast  Result Date: 12/31/2018 CLINICAL DATA:  Initial evaluation for acute  intermittent dizziness for 3 days. EXAM: MRI HEAD WITHOUT CONTRAST MRA HEAD WITHOUT CONTRAST MRA NECK WITHOUT CONTRAST TECHNIQUE: Multiplanar, multiecho pulse sequences of the brain and surrounding structures were obtained without intravenous contrast. Angiographic images of the Circle of Willis were obtained using MRA technique without intravenous contrast. Angiographic images of the neck were obtained using MRA technique without intravenous contrast. Carotid stenosis measurements (when applicable) are obtained utilizing NASCET criteria, using the distal internal carotid diameter as the denominator. COMPARISON:  None. FINDINGS: MRI HEAD FINDINGS Brain: Advanced age-related cerebral atrophy. Minimal T2/FLAIR hyperintensity within the periventricular white matter, most likely related to chronic microvascular ischemic disease, felt to be within normal limits for age. 6 mm focus of diffusion abnormality at the left occipital lobe compatible with a small acute ischemic infarct (series 3, image 18). No associated hemorrhage or mass effect. No other evidence for acute or subacute ischemia. Gray-white matter differentiation otherwise maintained. No encephalomalacia to suggest chronic cortical infarction. No foci of susceptibility artifact to suggest acute or chronic intracranial hemorrhage. No mass lesion, midline shift or mass effect. Diffuse ventricular prominence related to global parenchymal volume loss without hydrocephalus. No extra-axial fluid collection. Pituitary gland normal. Vascular: Major intracranial vascular flow voids maintained. Skull and upper cervical spine: Prominent degenerative thickening at the tectorial membrane with resultant severe stenosis at the craniocervical junction and flattening of the right greater than left upper cervical spinal cord (series 13, image 6). No visible cord signal changes on this exam. Remainder the visualized cervical spine demonstrates no acute finding. Bone marrow signal  intensity within normal limits. No scalp soft tissue abnormality. Sinuses/Orbits: Patient status post bilateral ocular lens replacement. Mild scattered mucoperiosteal thickening throughout the paranasal sinuses without evidence for acute sinusitis. No mastoid effusion. Inner ear structures normal. Other: None. MRA HEAD FINDINGS ANTERIOR CIRCULATION: Distal cervical segments of the internal carotid arteries are patent with symmetric antegrade flow. Petrous segments widely patent bilaterally. Cavernous segments widely patent bilaterally. Moderate narrowing at the supraclinoid segments bilaterally, right slightly greater than left. ICA termini perfused. A1 segment patent. Right A1 not visualized, which could be either hypoplastic and/or absent. Grossly normal anterior communicating artery. Anterior cerebral arteries mildly irregular but patent to their distal aspects without significant stenosis. Mild atheromatous irregularity within the right M1 segment without high-grade stenosis. Normal right MCA bifurcation. Distal right MCA branches well perfused. On the left, there is a severe near occlusive stenosis involving the proximal-mid left M1 segment, measuring approximately 7 mm in length (series 404, image 11). Left M1 narrowed but patent distally. Severe stenosis at the proximal of the left M2 inferior division. Left MCA branches otherwise perfused distally. POSTERIOR CIRCULATION: Left vertebral artery mildly irregular but patent to the vertebrobasilar junction without high-grade stenosis. Patent left PICA. Right vertebral artery occluded at the skull base. Some retrograde filling into the distal right V4 segment across the vertebrobasilar junction. Right PICA not visualized. Moderate to severe segmental stenoses involving the proximal and distal basilar artery (series 406, image 11). Superior cerebral arteries grossly patent bilaterally. Both of the PCAs primarily supplied via the basilar. Additional multifocal  severe segmental stenoses involving the proximal and mid P2 segments seen bilaterally. PCAs are perfused to their distal aspects. MRA NECK FINDINGS Examination technically limited by lack of IV contrast. Partially visualized aortic arch within normal limits for caliber with normal 3 vessel morphology. No appreciable flow-limiting stenosis about the origin of the great vessels. Partially visualized subclavian arteries patent bilaterally. Right CCA patent from its origin to the  bifurcation without obvious flow-limiting stenosis. Atheromatous plaque about the right bifurcation with associated stenosis of approximately 50-60% by NASCET criteria. Right ICA otherwise patent distally. Left CCA tortuous proximally but grossly patent to the bifurcation without appreciable flow-limiting stenosis. No significant atheromatous narrowing about the left bifurcation. Left ICA mildly tortuous but otherwise patent. Both of the vertebral arteries arise from the subclavian arteries. Left vertebral artery dominant and widely patent within the neck. Right vertebral artery diminutive with scant irregular flow within the right V2 segment. Right vertebral otherwise occluded within the neck. IMPRESSION: MRI HEAD IMPRESSION: 1. 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 2. Pronounced degenerative thickening at the tectorial membrane with resultant severe spinal stenosis at the cranial cervical junction and impingement upon the upper cervical spinal cord. No definite cord signal changes evident on this exam. 3. Underlying age-related cerebral atrophy. No other acute intracranial abnormality identified. MRA HEAD IMPRESSION: 1. 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 Dominant left vertebral artery widely patent to the vertebrobasilar junction. 2. 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 3.  Severe near occlusive stenosis involving the proximal-mid left M1 segment, with additional downstream proximal left M2 stenosis. 4. Moderate diffuse atheromatous narrowing involving the supraclinoid ICAs bilaterally. MRA NECK IMPRESSION: 1. 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 Dominant left vertebral artery widely patent with antegrade flow. 2. 50-60% atheromatous stenosis at the right carotid bifurcation/origin of the right ICA. Right carotid artery system otherwise patent. 3. No hemodynamically significant stenosis within the left carotid artery system within the neck. Electronically Signed   By: Jeannine Boga M.D.   On: 12/31/2018 18:39   Mr Jodene Nam Neck Wo Contrast  Result Date: 12/31/2018 CLINICAL DATA:  Initial evaluation for acute intermittent dizziness for 3 days. EXAM: MRI HEAD WITHOUT CONTRAST MRA HEAD WITHOUT CONTRAST MRA NECK WITHOUT CONTRAST TECHNIQUE: Multiplanar, multiecho pulse sequences of the brain and surrounding structures were obtained without intravenous contrast. Angiographic images of the Circle of Willis were obtained using MRA technique without intravenous contrast. Angiographic images of the neck were obtained using MRA technique without intravenous contrast. Carotid stenosis measurements (when applicable) are obtained utilizing NASCET criteria, using the distal internal carotid diameter as the denominator. COMPARISON:  None. FINDINGS: MRI HEAD FINDINGS Brain: Advanced age-related cerebral atrophy. Minimal T2/FLAIR hyperintensity within the periventricular white matter, most likely related to chronic microvascular ischemic disease, felt to be within normal limits for age. 6 mm focus of diffusion abnormality at the left occipital lobe compatible with a small acute ischemic infarct (series 3, image 18). No associated hemorrhage or mass effect. No other evidence for acute or subacute ischemia. Gray-white matter differentiation otherwise maintained. No  encephalomalacia to suggest chronic cortical infarction. No foci of susceptibility artifact to suggest acute or chronic intracranial hemorrhage. No mass lesion, midline shift or mass effect. Diffuse ventricular prominence related to global parenchymal volume loss without hydrocephalus. No extra-axial fluid collection. Pituitary gland normal. Vascular: Major intracranial vascular flow voids maintained. Skull and upper cervical spine: Prominent degenerative thickening at the tectorial membrane with resultant severe stenosis at the craniocervical junction and flattening of the right greater than left upper cervical spinal cord (series 13, image 6). No visible cord signal changes on this exam. Remainder the visualized cervical spine demonstrates no acute finding. Bone marrow signal intensity within normal limits. No scalp soft tissue abnormality. Sinuses/Orbits: Patient status post bilateral ocular lens replacement. Mild  scattered mucoperiosteal thickening throughout the paranasal sinuses without evidence for acute sinusitis. No mastoid effusion. Inner ear structures normal. Other: None. MRA HEAD FINDINGS ANTERIOR CIRCULATION: Distal cervical segments of the internal carotid arteries are patent with symmetric antegrade flow. Petrous segments widely patent bilaterally. Cavernous segments widely patent bilaterally. Moderate narrowing at the supraclinoid segments bilaterally, right slightly greater than left. ICA termini perfused. A1 segment patent. Right A1 not visualized, which could be either hypoplastic and/or absent. Grossly normal anterior communicating artery. Anterior cerebral arteries mildly irregular but patent to their distal aspects without significant stenosis. Mild atheromatous irregularity within the right M1 segment without high-grade stenosis. Normal right MCA bifurcation. Distal right MCA branches well perfused. On the left, there is a severe near occlusive stenosis involving the proximal-mid left M1  segment, measuring approximately 7 mm in length (series 404, image 11). Left M1 narrowed but patent distally. Severe stenosis at the proximal of the left M2 inferior division. Left MCA branches otherwise perfused distally. POSTERIOR CIRCULATION: Left vertebral artery mildly irregular but patent to the vertebrobasilar junction without high-grade stenosis. Patent left PICA. Right vertebral artery occluded at the skull base. Some retrograde filling into the distal right V4 segment across the vertebrobasilar junction. Right PICA not visualized. Moderate to severe segmental stenoses involving the proximal and distal basilar artery (series 406, image 11). Superior cerebral arteries grossly patent bilaterally. Both of the PCAs primarily supplied via the basilar. Additional multifocal severe segmental stenoses involving the proximal and mid P2 segments seen bilaterally. PCAs are perfused to their distal aspects. MRA NECK FINDINGS Examination technically limited by lack of IV contrast. Partially visualized aortic arch within normal limits for caliber with normal 3 vessel morphology. No appreciable flow-limiting stenosis about the origin of the great vessels. Partially visualized subclavian arteries patent bilaterally. Right CCA patent from its origin to the bifurcation without obvious flow-limiting stenosis. Atheromatous plaque about the right bifurcation with associated stenosis of approximately 50-60% by NASCET criteria. Right ICA otherwise patent distally. Left CCA tortuous proximally but grossly patent to the bifurcation without appreciable flow-limiting stenosis. No significant atheromatous narrowing about the left bifurcation. Left ICA mildly tortuous but otherwise patent. Both of the vertebral arteries arise from the subclavian arteries. Left vertebral artery dominant and widely patent within the neck. Right vertebral artery diminutive with scant irregular flow within the right V2 segment. Right vertebral otherwise  occluded within the neck. IMPRESSION: MRI HEAD IMPRESSION: 1. 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 2. Pronounced degenerative thickening at the tectorial membrane with resultant severe spinal stenosis at the cranial cervical junction and impingement upon the upper cervical spinal cord. No definite cord signal changes evident on this exam. 3. Underlying age-related cerebral atrophy. No other acute intracranial abnormality identified. MRA HEAD IMPRESSION: 1. 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 Dominant left vertebral artery widely patent to the vertebrobasilar junction. 2. 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 3. Severe near occlusive stenosis involving the proximal-mid left M1 segment, with additional downstream proximal left M2 stenosis. 4. Moderate diffuse atheromatous narrowing involving the supraclinoid ICAs bilaterally. MRA NECK IMPRESSION: 1. 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 Dominant left vertebral artery widely patent with antegrade flow. 2. 50-60% atheromatous stenosis at the right carotid bifurcation/origin of the right ICA. Right carotid artery system otherwise patent. 3. No hemodynamically significant stenosis within the left carotid artery system within the neck. Electronically Signed  By: Jeannine Boga M.D.   On: 12/31/2018 18:39   Mr Brain Wo Contrast  Result Date: 12/31/2018 CLINICAL DATA:  Initial evaluation for acute intermittent dizziness for 3 days. EXAM: MRI HEAD WITHOUT CONTRAST MRA HEAD WITHOUT CONTRAST MRA NECK WITHOUT CONTRAST TECHNIQUE: Multiplanar, multiecho pulse sequences of the brain and surrounding structures were obtained without intravenous contrast. Angiographic images of the Circle of Willis were obtained using MRA technique without intravenous contrast. Angiographic images of  the neck were obtained using MRA technique without intravenous contrast. Carotid stenosis measurements (when applicable) are obtained utilizing NASCET criteria, using the distal internal carotid diameter as the denominator. COMPARISON:  None. FINDINGS: MRI HEAD FINDINGS Brain: Advanced age-related cerebral atrophy. Minimal T2/FLAIR hyperintensity within the periventricular white matter, most likely related to chronic microvascular ischemic disease, felt to be within normal limits for age. 6 mm focus of diffusion abnormality at the left occipital lobe compatible with a small acute ischemic infarct (series 3, image 18). No associated hemorrhage or mass effect. No other evidence for acute or subacute ischemia. Gray-white matter differentiation otherwise maintained. No encephalomalacia to suggest chronic cortical infarction. No foci of susceptibility artifact to suggest acute or chronic intracranial hemorrhage. No mass lesion, midline shift or mass effect. Diffuse ventricular prominence related to global parenchymal volume loss without hydrocephalus. No extra-axial fluid collection. Pituitary gland normal. Vascular: Major intracranial vascular flow voids maintained. Skull and upper cervical spine: Prominent degenerative thickening at the tectorial membrane with resultant severe stenosis at the craniocervical junction and flattening of the right greater than left upper cervical spinal cord (series 13, image 6). No visible cord signal changes on this exam. Remainder the visualized cervical spine demonstrates no acute finding. Bone marrow signal intensity within normal limits. No scalp soft tissue abnormality. Sinuses/Orbits: Patient status post bilateral ocular lens replacement. Mild scattered mucoperiosteal thickening throughout the paranasal sinuses without evidence for acute sinusitis. No mastoid effusion. Inner ear structures normal. Other: None. MRA HEAD FINDINGS ANTERIOR CIRCULATION: Distal cervical segments of the  internal carotid arteries are patent with symmetric antegrade flow. Petrous segments widely patent bilaterally. Cavernous segments widely patent bilaterally. Moderate narrowing at the supraclinoid segments bilaterally, right slightly greater than left. ICA termini perfused. A1 segment patent. Right A1 not visualized, which could be either hypoplastic and/or absent. Grossly normal anterior communicating artery. Anterior cerebral arteries mildly irregular but patent to their distal aspects without significant stenosis. Mild atheromatous irregularity within the right M1 segment without high-grade stenosis. Normal right MCA bifurcation. Distal right MCA branches well perfused. On the left, there is a severe near occlusive stenosis involving the proximal-mid left M1 segment, measuring approximately 7 mm in length (series 404, image 11). Left M1 narrowed but patent distally. Severe stenosis at the proximal of the left M2 inferior division. Left MCA branches otherwise perfused distally. POSTERIOR CIRCULATION: Left vertebral artery mildly irregular but patent to the vertebrobasilar junction without high-grade stenosis. Patent left PICA. Right vertebral artery occluded at the skull base. Some retrograde filling into the distal right V4 segment across the vertebrobasilar junction. Right PICA not visualized. Moderate to severe segmental stenoses involving the proximal and distal basilar artery (series 406, image 11). Superior cerebral arteries grossly patent bilaterally. Both of the PCAs primarily supplied via the basilar. Additional multifocal severe segmental stenoses involving the proximal and mid P2 segments seen bilaterally. PCAs are perfused to their distal aspects. MRA NECK FINDINGS Examination technically limited by lack of IV contrast. Partially visualized aortic arch within normal limits for caliber with normal 3  vessel morphology. No appreciable flow-limiting stenosis about the origin of the great vessels. Partially  visualized subclavian arteries patent bilaterally. Right CCA patent from its origin to the bifurcation without obvious flow-limiting stenosis. Atheromatous plaque about the right bifurcation with associated stenosis of approximately 50-60% by NASCET criteria. Right ICA otherwise patent distally. Left CCA tortuous proximally but grossly patent to the bifurcation without appreciable flow-limiting stenosis. No significant atheromatous narrowing about the left bifurcation. Left ICA mildly tortuous but otherwise patent. Both of the vertebral arteries arise from the subclavian arteries. Left vertebral artery dominant and widely patent within the neck. Right vertebral artery diminutive with scant irregular flow within the right V2 segment. Right vertebral otherwise occluded within the neck. IMPRESSION: MRI HEAD IMPRESSION: 1. 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 2. Pronounced degenerative thickening at the tectorial membrane with resultant severe spinal stenosis at the cranial cervical junction and impingement upon the upper cervical spinal cord. No definite cord signal changes evident on this exam. 3. Underlying age-related cerebral atrophy. No other acute intracranial abnormality identified. MRA HEAD IMPRESSION: 1. 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 Dominant left vertebral artery widely patent to the vertebrobasilar junction. 2. 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 3. Severe near occlusive stenosis involving the proximal-mid left M1 segment, with additional downstream proximal left M2 stenosis. 4. Moderate diffuse atheromatous narrowing involving the supraclinoid ICAs bilaterally. MRA NECK IMPRESSION: 1. 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 Dominant left vertebral artery widely patent with antegrade flow. 2. 50-60% atheromatous  stenosis at the right carotid bifurcation/origin of the right ICA. Right carotid artery system otherwise patent. 3. No hemodynamically significant stenosis within the left carotid artery system within the neck. Electronically Signed   By: Jeannine Boga M.D.   On: 12/31/2018 18:39   Nm Hepatobiliary Liver Func  Result Date: 01/13/2019 CLINICAL DATA:  Patient admitted 01/12/2019 with abdominal pain. EXAM: NUCLEAR MEDICINE HEPATOBILIARY IMAGING TECHNIQUE: Sequential images of the abdomen were obtained out to 60 minutes following intravenous administration of radiopharmaceutical. RADIOPHARMACEUTICALS:  5.41 mCi Tc-48m Choletec IV COMPARISON:  None. FINDINGS: Prompt, homogeneous radiotracer uptake in the liver and flow of radiotracer into bowel. The gallbladder was not visualized 90 minutes. Therefore, 2.7 mg of morphine rib minister diabetes the patient. The patient was imaged for an additional 30 minutes. The gallbladder again was not visualized. IMPRESSION: Nonvisualization of the gallbladder including after administration of morphine consistent with occlusion of the cystic duct. Electronically Signed   By: TInge RiseM.D.   On: 01/13/2019 13:44   Ct Abdomen Pelvis W Contrast  Result Date: 01/12/2019 CLINICAL DATA:  Right upper quadrant pain EXAM: CT ABDOMEN AND PELVIS WITH CONTRAST TECHNIQUE: Multidetector CT imaging of the abdomen and pelvis was performed using the standard protocol following bolus administration of intravenous contrast. CONTRAST:  835mOMNIPAQUE IOHEXOL 300 MG/ML  SOLN COMPARISON:  Ultrasound 05/14/2018 FINDINGS: Lower chest: Lung bases demonstrate no acute consolidation or effusion. Atelectasis at the left base. Heart size within normal limits. Large hiatal hernia. Distal esophageal thickening. Hepatobiliary: No focal liver abnormality is seen. No gallstones, gallbladder wall thickening, or biliary dilatation. Pancreas: Unremarkable. No pancreatic ductal dilatation or  surrounding inflammatory changes. Spleen: Normal in size without focal abnormality. Adrenals/Urinary Tract: Adrenal glands are normal. No hydronephrosis. Cysts within the bilateral kidneys. Bladder is normal Stomach/Bowel: The stomach is nonenlarged. No dilated small bowel. Descending and  sigmoid colon diverticular disease without acute inflammatory change. Vascular/Lymphatic: Ectatic distal infrarenal abdominal aorta up to 2.9 cm. Moderate aortic atherosclerosis. No significantly enlarged lymph nodes. Reproductive: Post TURP changes of enlarged prostate. Other: Negative for free air or free fluid. Fat and small calcification in the umbilical region with tiny fluid. Musculoskeletal: Degenerative changes. No acute or suspicious osseous abnormality. IMPRESSION: 1. No CT evidence for acute intra-abdominal or pelvic abnormality. 2. Large hiatal hernia. Distal esophageal thickening, possible esophagitis or reflux 3. Extensive left colon diverticular disease without acute inflammatory change 4. Ectatic distal infrarenal abdominal aorta up to 2.9 cm. Ectatic abdominal aorta at risk for aneurysm development. Recommend followup by ultrasound in 5 years. This recommendation follows ACR consensus guidelines: White Paper of the ACR Incidental Findings Committee II on Vascular Findings. J Am Coll Radiol 2013; 10:789-794. Electronically Signed   By: Donavan Foil M.D.   On: 01/12/2019 23:39   US Abdomen Limited  Result Date: 01/13/2019 CLINICAL DATA:  Epigastric pain EXAM: ULTRASOUND ABDOMEN LIMITED RIGHT UPPER QUADRANT COMPARISON:  CT 01/12/2019 FINDINGS: Gallbladder: No shadowing stones or sludge. Increased wall thickness at 4.1 mm. Negative sonographic Murphy. Common bile duct: Diameter: Poorly visualized, about 2.2 mm Liver: No focal lesion identified. Within normal limits in parenchymal echogenicity. Portal vein is patent on color Doppler imaging with normal direction of blood flow towards the liver. IMPRESSION: No  shadowing stones. Slightly thickened gallbladder wall, nonspecific finding, could be secondary to acute or chronic cholecystitis, liver disease, or edema forming states. If acute gallbladder disease remains a concern, further evaluation with hepatobiliary nuclear medicine study could be obtained. Electronically Signed   By: Donavan Foil M.D.   On: 01/13/2019 01:43    Labs:  CBC: Recent Labs    01/04/19 0516 01/12/19 2205 01/13/19 0552 01/14/19 0941  WBC 5.9 11.4* 11.5* 22.0*  HGB 13.5 13.8 13.5 13.9  HCT 42.5 43.5 40.9 42.8  PLT 225 248 256 242    COAGS: No results for input(s): INR, APTT in the last 8760 hours.  BMP: Recent Labs    01/03/19 0514 01/04/19 0516 01/12/19 2205 01/13/19 0552  NA 142 138 139 140  K 4.0 3.8 4.0 3.7  CL 106 105 106 104  CO2 _0 21*  GLUCOSE 117* 128* 155* 154*  BUN _1 CALCIUM 8.8* 9.0 8.7* 8.6*  CREATININE 1.61* 1.63* 1.55* 1.40*  GFRNONAA 35* 35* 37* 42*  GFRAA 41* 40* 43* 48*    LIVER FUNCTION TESTS: Recent Labs    01/01/19 0421 01/04/19 0516 01/12/19 2205 01/13/19 0552  BILITOT 0.8 0.9 0.6 0.6  AST _2 ALT _3 ALKPHOS 45 52 78 77  PROT 5.5* 5.3* 6.2* 6.4*  ALBUMIN 3.0* 2.9* 3.4* 3.4*    TUMOR MARKERS: No results for input(s): AFPTM, CEA, CA199, CHROMGRNA in the last 8760 hours.  Assessment and Plan:  85 y/o M who was admitted to [facility] from 2/13 - 01/10/19 due to what was felt to be a CVA (however per patient and his family they were recently told this was actually BPPV and did not have a CVA) who presented to [facility] ED on 01/12/19 due to crampy RUQ abdominal pain without n/v that began approximately 3 hours after eating. Initial imaging and lab work were unremarkable, however HIDA scan was positive for acalculous cystic duct occlusion. He was admitted for acute acalculous cholecystitis and started on IV Rocephin. His symptoms improved after presenting to the  ED and he has been asymptomatic since then.  General surgery was consulted and it was recommended to continue antibiotics +/- cholecystostomy placement for which IR has been consulted.   Discussed possible cholecystostomy today with patient and his family - he continues to deny any symptoms and his physical exam is unremarkable. He is requesting to eat. Patient reports he would not consider general anesthesia due to his intracranial stenosis and blood pressure concerns and that he would possibly consider moderate anesthesia but not unless absolutely necessary. Discussed continuing antibiotics and trialing a liquid diet today to assess for recurrence of symptoms which he and his family are in agreement with. Patient has been reviewed by Dr. Laurence Ferrari who also agrees with continuing antibiotics for now and placing cholecystostomy if symptoms return.  Discussed this plan with surgery PA today who is also in agreement - she will advance diet to liquids today as well as changing antibiotics to Zosyn.   His WBC has risen to 22.0 today from 11.5 yesterday, however he remains afebrile and asymptomatic. AST 25, ALT 19, ALP 66, t.bili 1.0.   IR will continue to follow and if patient becomes symptomatic will plan for percutaneous cholecystostomy placement.  Please call with any questions or concerns,   Thank you for this interesting consult.  I greatly enjoyed meeting Robert Rowe and look forward to participating in their care.  A copy of this report was sent to the requesting provider on this date.  Electronically Signed: Joaquim Nam, PA-C 01/14/2019, 10:45 AM   I spent a total of 40 Minutes in face to face in clinical consultation, greater than 50% of which was counseling/coordinating care for percutaneous cholecystostomy.

## 2019-01-15 ENCOUNTER — Other Ambulatory Visit: Payer: Self-pay

## 2019-01-15 ENCOUNTER — Encounter (HOSPITAL_COMMUNITY): Payer: Self-pay | Admitting: General Practice

## 2019-01-15 DIAGNOSIS — I69398 Other sequelae of cerebral infarction: Secondary | ICD-10-CM | POA: Diagnosis not present

## 2019-01-15 DIAGNOSIS — H5462 Unqualified visual loss, left eye, normal vision right eye: Secondary | ICD-10-CM | POA: Diagnosis not present

## 2019-01-15 LAB — GLUCOSE, CAPILLARY: Glucose-Capillary: 118 mg/dL — ABNORMAL HIGH (ref 70–99)

## 2019-01-15 LAB — COMPREHENSIVE METABOLIC PANEL
ALT: 16 U/L (ref 0–44)
AST: 19 U/L (ref 15–41)
Albumin: 2.6 g/dL — ABNORMAL LOW (ref 3.5–5.0)
Alkaline Phosphatase: 58 U/L (ref 38–126)
Anion gap: 13 (ref 5–15)
BUN: 15 mg/dL (ref 8–23)
CO2: 20 mmol/L — ABNORMAL LOW (ref 22–32)
Calcium: 8.4 mg/dL — ABNORMAL LOW (ref 8.9–10.3)
Chloride: 108 mmol/L (ref 98–111)
Creatinine, Ser: 1.57 mg/dL — ABNORMAL HIGH (ref 0.61–1.24)
GFR calc Af Amer: 42 mL/min — ABNORMAL LOW (ref >60)
GFR calc non Af Amer: 36 mL/min — ABNORMAL LOW (ref >60)
Glucose, Bld: 126 mg/dL — ABNORMAL HIGH (ref 70–99)
Potassium: 3.1 mmol/L — ABNORMAL LOW (ref 3.5–5.1)
Sodium: 141 mmol/L (ref 135–145)
Total Bilirubin: 1.1 mg/dL (ref 0.3–1.2)
Total Protein: 5.4 g/dL — ABNORMAL LOW (ref 6.5–8.1)

## 2019-01-15 LAB — CBC
HCT: 38.9 % — ABNORMAL LOW (ref 39.0–52.0)
Hemoglobin: 12.4 g/dL — ABNORMAL LOW (ref 13.0–17.0)
MCH: 30.7 pg (ref 26.0–34.0)
MCHC: 31.9 g/dL (ref 30.0–36.0)
MCV: 96.3 fL (ref 80.0–100.0)
Platelets: 222 10*3/uL (ref 150–400)
RBC: 4.04 MIL/uL — ABNORMAL LOW (ref 4.22–5.81)
RDW: 13 % (ref 11.5–15.5)
WBC: 17.9 10*3/uL — ABNORMAL HIGH (ref 4.0–10.5)
nRBC: 0 % (ref 0.0–0.2)

## 2019-01-15 LAB — MAGNESIUM: Magnesium: 1.7 mg/dL (ref 1.7–2.4)

## 2019-01-15 MED ORDER — POTASSIUM CHLORIDE CRYS ER 20 MEQ PO TBCR
40.0000 meq | EXTENDED_RELEASE_TABLET | Freq: Once | ORAL | Status: AC
  Administered 2019-01-15: 40 meq via ORAL
  Filled 2019-01-15: qty 2

## 2019-01-15 MED ORDER — SODIUM CHLORIDE 0.9 % IV SOLN
INTRAVENOUS | Status: DC | PRN
  Administered 2019-01-15: 250 mL via INTRAVENOUS

## 2019-01-15 NOTE — Progress Notes (Signed)
Patient ID: Robert Rowe, male   DOB: Nov 25, 1917, 85 y.o.   MRN: [id number]     Subjective: He denies any abdominal pain or nausea.  Did not have any difficulty with his liquid diet.  He is a little depressed as his classmate from medical school passed away yesterday.  He is now the oldest living alum from [employer] school.  Objective: Vital signs in last 24 hours: Temp:  [98 F (36.7 C)-98.5 F (36.9 C)] 98.2 F (36.8 C) (02/25 0502) Pulse Rate:  [95-100] 96 (02/25 0502) Resp:  [18-25] 18 (02/25 0502) BP: (128-134)/(70-72) 128/70 (02/25 0502) SpO2:  [94 %-96 %] 96 % (02/25 0502) Weight:  [66.1 kg] 66.1 kg (02/25 0502) Last BM Date: 01/10/19  Intake/Output from previous day: 02/24 0701 - 02/25 0700 In: 752.4 [P.O.:690; IV Piggyback:62.4] Out: 350 [Urine:350] Intake/Output this shift: No intake/output data recorded.  General appearance: alert, cooperative and no distress Resp: clear to auscultation bilaterally GI: Without any tenderness.  Nondistended.  No masses.  No guarding.  Lab Results:  Recent Labs    01/14/19 0941 01/15/19 0431  WBC 22.0* 17.9*  HGB 13.9 12.4*  HCT 42.8 38.9*  PLT 242 222   BMET Recent Labs    01/14/19 0941 01/15/19 0431  NA 142 141  K 3.6 3.1*  CL 110 108  CO2 24 20*  GLUCOSE 145* 126*  BUN 14 15  CREATININE 1.59* 1.57*  CALCIUM 8.7* 8.4*     Studies/Results: Nm Hepatobiliary Liver Func  Result Date: 01/13/2019 CLINICAL DATA:  Patient admitted 01/12/2019 with abdominal pain. EXAM: NUCLEAR MEDICINE HEPATOBILIARY IMAGING TECHNIQUE: Sequential images of the abdomen were obtained out to 60 minutes following intravenous administration of radiopharmaceutical. RADIOPHARMACEUTICALS:  5.41 mCi Tc-43m  Choletec IV COMPARISON:  None. FINDINGS: Prompt, homogeneous radiotracer uptake in the liver and flow of radiotracer into bowel. The gallbladder was not visualized 90 minutes. Therefore, 2.7 mg of morphine rib minister diabetes the  patient. The patient was imaged for an additional 30 minutes. The gallbladder again was not visualized. IMPRESSION: Nonvisualization of the gallbladder including after administration of morphine consistent with occlusion of the cystic duct. Electronically Signed   By: Inge Rise M.D.   On: 01/13/2019 13:44    Anti-infectives: Anti-infectives (From admission, onward)   Start     Dose/Rate Route Frequency Ordered Stop   01/14/19 1400  piperacillin-tazobactam (ZOSYN) IVPB 3.375 g     3.375 g 12.5 mL/hr over 240 Minutes Intravenous Every 8 hours 01/14/19 1041     01/13/19 1500  cefTRIAXone (ROCEPHIN) 2 g in sodium chloride 0.9 % 100 mL IVPB  Status:  Discontinued     2 g 200 mL/hr over 30 Minutes Intravenous Every 24 hours 01/13/19 1417 01/14/19 1041      Assessment/Plan: Presented with recurrent acute abdominal pain.  CT and ultrasound negative but HIDA was non-vis.  Significant leukocytosis which is improved today.  He has been asymptomatic with a normal abdominal exam for over 24 hours.  I would recommend allowing a diet and observing.  I will order a low-fat diet.  If he tolerates this well and leukocytosis continues to resolve I would not recommend any further intervention.    LOS: 1 day    Edward Jolly 01/15/2019

## 2019-01-15 NOTE — Consult Note (Signed)
   [facility] Inpatient Consult   01/15/2019  Robert Rowe Dec 19, 1917 [id number]  Patient screened for less than 30 day readmission and to review for  [place] [employer] services. Chart was reviewed and progress notes are as follows: 85 year old male with a history of hypertension, chronic kidney disease, stage III, chronic diastolic heart failure, hypothyroidism, CVA earlier this month presented with abdominal pain.  Found to have acute cholecystitis. Patient recently home from an inpatient rehab stay.  He is also a retired Engineer, drilling.  No community follow up needs assessed at this time.  Please refer for changes in assessment.  For questions contact:   Natividad Brood, RN BSN [facility] Liaison  [phone] business mobile phone Toll free office [phone]

## 2019-01-15 NOTE — Evaluation (Signed)
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  Found to have acute cholecystitis. Discharged from [place] [place] 01/10/2019 where he was rehabbing from possible CVA and fall.   Clinical Impression  Pt had discharged from CIR and was ambulating 150 feet with RW and ascend/descending 6 steps into his home. Pt currently limited in safe mobility by dizziness, in the presence of generalized weakness and decreased endurance. Pt feeling weak as he has not eaten in 2-3 days. Pt currently requires supervision for bed mobility and min guard for transfers and ambulation of 25 feet with RW. PT recommends HHPT level rehab at d/c to improve strength and endurance in his home environment. PT will continue to follow acutely with focus of next session on stair training. PT also requested vestibular PT consult for possible BBPV diagnosis due to dizziness with positional change.      Follow Up Recommendations Home health PT;Supervision/Assistance - 24 hour    Equipment Recommendations  None recommended by PT       Precautions / Restrictions Precautions Precautions: Fall Precaution Comments: dizzy with all motion Restrictions Weight Bearing Restrictions: No      Mobility  Bed Mobility Overal bed mobility: Needs Assistance Bed Mobility: Supine to Sit     Supine to sit: Supervision     General bed mobility comments: supervision for safety, increase effort and time and heavy use of bedrail to pull to EoB, dizziness with sitting  Transfers Overall transfer level: Needs assistance Equipment  used: Rolling walker (2 wheeled) Transfers: Sit to/from Stand Sit to Stand: Min guard         General transfer comment: min guard for safety, dizziness with standing which got better but persisted through ambulation, vc for hand placement for powerup and for bringing RW all the way back to the recliner before sitting  Ambulation/Gait Ambulation/Gait assistance: Min guard Gait Distance (Feet): 25 Feet Assistive device: Rolling walker (2 wheeled) Gait Pattern/deviations: Step-through pattern;Decreased step length - right;Decreased step length - left;Shuffle;Trunk flexed Gait velocity: slowed Gait velocity interpretation: <1.31 ft/sec, indicative of household ambulator General Gait Details: hands on min guard for safety, pt reports overwhelming sense of weakness as he hasn't eaten, slow, shuffling gait   Stairs            Wheelchair Mobility    Modified Rankin (Stroke Patients Only)       Balance Overall balance assessment: Needs assistance Sitting-balance support: Feet supported Sitting balance-Leahy Scale: Fair     Standing balance support: Bilateral upper extremity supported Standing balance-Leahy Scale: Poor Standing balance comment: requires UE support for balance                             Pertinent Vitals/Pain Pain Assessment: No/denies pain    Home Living Family/patient expects to be discharged to:: Private residence Living Arrangements: Spouse/significant other Available Help at Discharge: Family;Available 24 hours/day Type of Home: Apartment Home Access: Stairs to enter Entrance Stairs-Rails: Right;Left Entrance Stairs-Number of Steps: 6 Home Layout: Two level;Bed/bath upstairs;1/2 bath on main level Home Equipment: Walker - 2 wheels      Prior Function  Level of Independence: Needs assistance   Gait / Transfers Assistance Needed: occasional use of RW for ambulation   ADL's / Homemaking Assistance Needed: wife assists with some iADLs.          Hand Dominance        Extremity/Trunk Assessment   Upper Extremity Assessment Upper Extremity Assessment: Overall WFL for tasks assessed    Lower Extremity Assessment Lower Extremity Assessment: Overall WFL for tasks assessed       Communication   Communication: HOH  Cognition Arousal/Alertness: Awake/alert Behavior During Therapy: WFL for tasks assessed/performed Overall Cognitive Status: History of cognitive impairments - at baseline Area of Impairment: Memory                     Memory: Decreased short-term memory         General Comments: confusion over night "dreaming" that people where in his room that were not.       General Comments General comments (skin integrity, edema, etc.): Pt with foam dressing on R knee, which pt reports was from original fall earlier in month    Exercises     Assessment/Plan    PT Assessment Patient needs continued PT services  PT Problem List Decreased strength;Decreased activity tolerance;Decreased balance;Decreased mobility;Decreased cognition       PT Treatment Interventions DME instruction;Gait training;Stair training;Functional mobility training;Therapeutic activities;Therapeutic exercise;Balance training;Patient/family education;Cognitive remediation    PT Goals (Current goals can be found in the Care Plan section)  Acute Rehab PT Goals Patient Stated Goal: return home PT Goal Formulation: With patient Time For Goal Achievement: 01/29/19 Potential to Achieve Goals: Good    Frequency Min 3X/week   Barriers to discharge Inaccessible home environment         AM-PAC PT "6 Clicks" Mobility  Outcome Measure Help needed turning from your back to your side while in a flat bed without using bedrails?: A Little Help needed moving from lying on your back to sitting on the side of a flat bed without using bedrails?: A Little Help needed moving to and from a bed to a chair (including a wheelchair)?: A  Little Help needed standing up from a chair using your arms (e.g., wheelchair or bedside chair)?: A Little Help needed to walk in hospital room?: A Little Help needed climbing 3-5 steps with a railing? : A Lot 6 Click Score: 17    End of Session Equipment Utilized During Treatment: Gait belt Activity Tolerance: Patient limited by fatigue Patient left: in chair;with call bell/phone within reach;with chair alarm set;with family/visitor present Nurse Communication: Mobility status PT Visit Diagnosis: Unsteadiness on feet (R26.81);Other abnormalities of gait and mobility (R26.89);Muscle weakness (generalized) (M62.81);History of falling (Z91.81);Difficulty in walking, not elsewhere classified (R26.2);Dizziness and giddiness (R42)    Time: 4585-9292 PT Time Calculation (min) (ACUTE ONLY): 26 min   Charges:   PT Evaluation $PT Eval Moderate Complexity: 1 Mod PT Treatments $Gait Training: 8-22 mins        Atul Delucia B. Migdalia Dk PT, DPT Acute Rehabilitation Services Pager [phone] Office [phone]   [place] 01/15/2019, 9:53 AM

## 2019-01-15 NOTE — Progress Notes (Addendum)
PROGRESS NOTE    Robert Rowe  HDQ:[id number] DOB: Jan 07, 1918 DOA: 01/12/2019 PCP: [name], MD   Brief Narrative:  85 year old male with a history of hypertension, chronic kidney disease, stage III, chronic diastolic heart failure, hypothyroidism, CVA earlier this month presented with abdominal pain.  Found to have acute cholecystitis.  General surgery consulted, conservatively managing at this time. Assessment & Plan   Abdominal pain secondary to acute cholecystitis -Presented with acute onset of epigastric pain later localizing to the right upper quadrant -RUQ ultrasound showed thickening gallbladder wall -HIDA scan: Nonvisualization of gallbladder including after administration of morphine consistent with occlusion of cystic duct -General surgery consulted and appreciated-can tolerate diet and leukocytosis continues to improve, would not recommend further intervention -Patient was placed on heart healthy diet by general surgery  Leukocytosis -Secondary to the above, currently down to 17.9 -Continue to monitor CBC  Hypokalemia -Will replace and continue to monitor BMP -Also obtain magnesium level  Chronic kidney disease, stage III -Creatinine appears to be stable, continue to monitor BMP  Recent incidental finding of CVA -He presented earlier this month with complaints of vertigo and was found to have acute left occipital CVA.  His dizziness was thought to be secondary to BPPV -Patient was placed on DAPT for 3 months, followed by Plavix alone.  Currently on statin -Plavix held pending decision of drain placement  Chronic diastolic heart failure -Appears to be euvolemic and compensated -Monitor intake and output, daily weights  Hypothyroidism -Continue Synthroid  Deconditioning -Likely secondary to age, versus recent CVA versus current illness -Patient was standing today while trying to urinate, did appear to be extremely weak -PT recommended home health,  supervision/assistance 24 hours  DVT Prophylaxis Heparin  Code Status: Full  Family Communication: None at bedside  Disposition Plan: Admitted.  Disposition pending.  Pending further surgical recommendations  Consultants General surgery  Procedures  RUQ ultrasound HIDA  Antibiotics   Anti-infectives (From admission, onward)   Start     Dose/Rate Route Frequency Ordered Stop   01/14/19 1400  piperacillin-tazobactam (ZOSYN) IVPB 3.375 g     3.375 g 12.5 mL/hr over 240 Minutes Intravenous Every 8 hours 01/14/19 1041     01/13/19 1500  cefTRIAXone (ROCEPHIN) 2 g in sodium chloride 0.9 % 100 mL IVPB  Status:  Discontinued     2 g 200 mL/hr over 30 Minutes Intravenous Every 24 hours 01/13/19 1417 01/14/19 1041      Subjective:   Robert Rowe seen and examined today.  Feels abdominal pain has improved.  Denies current chest pain, shortness breath, nausea or vomiting, diarrhea or constipation, dizziness or headache.  Objective:   Vitals:   01/14/19 1534 01/14/19 2014 01/15/19 0502 01/15/19 0949  BP: 132/70 134/72 128/70   Pulse: 100 95 96   Resp: (!) 25 19 18    Temp: 98.5 F (36.9 C) 98 F (36.7 C) 98.2 F (36.8 C)   TempSrc: Oral Oral Oral   SpO2: 94% 95% 96%   Weight:   66.1 kg   Height:    5\' 2"  (1.575 m)    Intake/Output Summary (Last 24 hours) at 01/15/2019 0950 Last data filed at 01/15/2019 0051 Gross per 24 hour  Intake 752.41 ml  Output 350 ml  Net 402.41 ml   Filed Weights   01/13/19 0458 01/15/19 0502  Weight: 66.4 kg 66.1 kg    Exam  General: Well developed, elderly, frail-appearing, NAD  HEENT: NCAT, mucous membranes moist.   Neck: Supple  Cardiovascular: S1 S2 auscultated, RRR  Respiratory: Clear to auscultation bilaterally with equal chest rise  Abdomen: Soft, nontender, nondistended, + bowel sounds  Extremities: warm dry without cyanosis clubbing or edema  Neuro: AAOx3, nonfocal  Psych: Appropriate mood and affect   Data Reviewed:  I have personally reviewed following labs and imaging studies  CBC: Recent Labs  Lab 01/12/19 2205 01/13/19 0552 01/14/19 0941 01/15/19 0431  WBC 11.4* 11.5* 22.0* 17.9*  NEUTROABS  --  9.2*  --   --   HGB 13.8 13.5 13.9 12.4*  HCT 43.5 40.9 42.8 38.9*  MCV 98.0 94.7 96.8 96.3  PLT 248 256 242 341   Basic Metabolic Panel: Recent Labs  Lab 01/12/19 2205 01/13/19 0552 01/14/19 0941 01/15/19 0431  NA 139 140 142 141  K 4.0 3.7 3.6 3.1*  CL 106 104 110 108  CO2 24 21* 24 20*  GLUCOSE 155* 154* 145* 126*  BUN 18 15 14 15   CREATININE 1.55* 1.40* 1.59* 1.57*  CALCIUM 8.7* 8.6* 8.7* 8.4*   GFR: Estimated Creatinine Clearance: 22 mL/min (A) (by C-G formula based on SCr of 1.57 mg/dL (H)). Liver Function Tests: Recent Labs  Lab 01/12/19 2205 01/13/19 0552 01/14/19 0941 01/15/19 0431  AST 17 19 25 19   ALT 14 15 19 16   ALKPHOS 78 77 66 58  BILITOT 0.6 0.6 1.0 1.1  PROT 6.2* 6.4* 6.0* 5.4*  ALBUMIN 3.4* 3.4* 3.1* 2.6*   Recent Labs  Lab 01/12/19 2205  LIPASE 42   No results for input(s): AMMONIA in the last 168 hours. Coagulation Profile: No results for input(s): INR, PROTIME in the last 168 hours. Cardiac Enzymes: Recent Labs  Lab 01/12/19 2205 01/13/19 0303  TROPONINI <0.03 <0.03   BNP (last 3 results) No results for input(s): PROBNP in the last 8760 hours. HbA1C: No results for input(s): HGBA1C in the last 72 hours. CBG: Recent Labs  Lab 01/13/19 0810 01/14/19 0814 01/15/19 0753  GLUCAP 139* 138* 118*   Lipid Profile: No results for input(s): CHOL, HDL, LDLCALC, TRIG, CHOLHDL, LDLDIRECT in the last 72 hours. Thyroid Function Tests: No results for input(s): TSH, T4TOTAL, FREET4, T3FREE, THYROIDAB in the last 72 hours. Anemia Panel: No results for input(s): VITAMINB12, FOLATE, FERRITIN, TIBC, IRON, RETICCTPCT in the last 72 hours. Urine analysis:    Component Value Date/Time   COLORURINE YELLOW 01/13/2019 0027   APPEARANCEUR CLEAR 01/13/2019  0027   LABSPEC 1.025 01/13/2019 0027   PHURINE 6.0 01/13/2019 0027   GLUCOSEU NEGATIVE 01/13/2019 0027   HGBUR NEGATIVE 01/13/2019 0027   BILIRUBINUR NEGATIVE 01/13/2019 0027   KETONESUR NEGATIVE 01/13/2019 0027   PROTEINUR NEGATIVE 01/13/2019 0027   UROBILINOGEN 0.2 08/27/2015 1930   NITRITE NEGATIVE 01/13/2019 0027   LEUKOCYTESUR NEGATIVE 01/13/2019 0027   Sepsis Labs: @LABRCNTIP (procalcitonin:4,lacticidven:4)  )No results found for this or any previous visit (from the past 240 hour(s)).    Radiology Studies: Nm Hepatobiliary Liver Func  Result Date: 01/13/2019 CLINICAL DATA:  Patient admitted 01/12/2019 with abdominal pain. EXAM: NUCLEAR MEDICINE HEPATOBILIARY IMAGING TECHNIQUE: Sequential images of the abdomen were obtained out to 60 minutes following intravenous administration of radiopharmaceutical. RADIOPHARMACEUTICALS:  5.41 mCi Tc-10m  Choletec IV COMPARISON:  None. FINDINGS: Prompt, homogeneous radiotracer uptake in the liver and flow of radiotracer into bowel. The gallbladder was not visualized 90 minutes. Therefore, 2.7 mg of morphine rib minister diabetes the patient. The patient was imaged for an additional 30 minutes. The gallbladder again was not visualized. IMPRESSION: Nonvisualization of the gallbladder  including after administration of morphine consistent with occlusion of the cystic duct. Electronically Signed   By: Inge Rise M.D.   On: 01/13/2019 13:44     Scheduled Meds: . aspirin EC  81 mg Oral Daily  . heparin  5,000 Units Subcutaneous Q8H  . latanoprost  1 drop Both Eyes QHS  . levothyroxine  50 mcg Oral QAC breakfast  . metoprolol succinate  25 mg Oral QHS  . polyethylene glycol  17 g Oral Daily   Continuous Infusions: . famotidine (PEPCID) IV Stopped (01/14/19 1143)  . piperacillin-tazobactam (ZOSYN)  IV 3.375 g (01/15/19 0659)     LOS: 1 day   Time Spent in minutes   30 minutes  Juliann Olesky D.O. on 01/15/2019 at 9:50 AM  Between 7am to  7pm - Please see pager noted on [email or web address]  After 7pm go to [email or web address]  And look for the night coverage person covering for me after hours  [facility] Office  [phone]

## 2019-01-16 DIAGNOSIS — E039 Hypothyroidism, unspecified: Secondary | ICD-10-CM

## 2019-01-16 DIAGNOSIS — I5032 Chronic diastolic (congestive) heart failure: Secondary | ICD-10-CM

## 2019-01-16 DIAGNOSIS — N183 Chronic kidney disease, stage 3 (moderate): Secondary | ICD-10-CM

## 2019-01-16 DIAGNOSIS — K810 Acute cholecystitis: Secondary | ICD-10-CM

## 2019-01-16 DIAGNOSIS — I10 Essential (primary) hypertension: Secondary | ICD-10-CM

## 2019-01-16 LAB — CBC
HCT: 40.1 % (ref 39.0–52.0)
Hemoglobin: 12.9 g/dL — ABNORMAL LOW (ref 13.0–17.0)
MCH: 30.7 pg (ref 26.0–34.0)
MCHC: 32.2 g/dL (ref 30.0–36.0)
MCV: 95.5 fL (ref 80.0–100.0)
Platelets: 242 10*3/uL (ref 150–400)
RBC: 4.2 MIL/uL — ABNORMAL LOW (ref 4.22–5.81)
RDW: 12.9 % (ref 11.5–15.5)
WBC: 14.7 10*3/uL — ABNORMAL HIGH (ref 4.0–10.5)
nRBC: 0 % (ref 0.0–0.2)

## 2019-01-16 MED ORDER — ATORVASTATIN CALCIUM 80 MG PO TABS: 80.0000 mg | ORAL_TABLET | Freq: Every day | ORAL | Status: DC

## 2019-01-16 MED ORDER — DOCUSATE SODIUM 100 MG PO CAPS
100.0000 mg | ORAL_CAPSULE | Freq: Every day | ORAL | Status: DC
  Administered 2019-01-17: 100 mg via ORAL
  Filled 2019-01-16 (×2): qty 1

## 2019-01-16 MED ORDER — SODIUM CHLORIDE 0.9 % IV SOLN
1.5000 g | Freq: Two times a day (BID) | INTRAVENOUS | Status: DC
  Administered 2019-01-16 – 2019-01-17 (×2): 1.5 g via INTRAVENOUS
  Filled 2019-01-16 (×3): qty 1.5

## 2019-01-16 MED ORDER — FAMOTIDINE 20 MG PO TABS
20.0000 mg | ORAL_TABLET | Freq: Every day | ORAL | Status: DC
  Administered 2019-01-16 – 2019-01-17 (×2): 20 mg via ORAL
  Filled 2019-01-16 (×2): qty 1

## 2019-01-16 MED ORDER — POLYETHYLENE GLYCOL 3350 17 G PO PACK: 17.0000 g | PACK | Freq: Every day | ORAL | Status: DC

## 2019-01-16 MED ORDER — CLOPIDOGREL BISULFATE 75 MG PO TABS
75.0000 mg | ORAL_TABLET | Freq: Every day | ORAL | Status: DC
  Administered 2019-01-17: 75 mg via ORAL
  Filled 2019-01-16: qty 1

## 2019-01-16 MED ORDER — POTASSIUM CHLORIDE CRYS ER 20 MEQ PO TBCR
40.0000 meq | EXTENDED_RELEASE_TABLET | Freq: Once | ORAL | Status: AC
  Administered 2019-01-16: 40 meq via ORAL
  Filled 2019-01-16: qty 2

## 2019-01-16 NOTE — Progress Notes (Signed)
PT Note Case manager and doctor called this PT regarding updated d/c plan to SNF.  Discussed that pt needed higher level of care prior to d/c home and that this PT had called wife to come to that conclusion.  Case manager was notifying me to let me know that wife and son were in pts room and wanted to talk with this PT.  This PT went back to pts room and discussed d/c options.  Son really wanted Rehab over SNF and discussion was had about the fact that pt does not have a diagnosis for Rehab this admission.  This PT explained that best options are SNF vs Home with hired caregiver as pt needs 24 hour care.  Pt, son and wife agree that pt needs some Rehab prior to d/c home therefore they want to pursue SNF.  Updated case manager and MD approves with this as well.  Spent 30 minutes in room discussing these options with pt and family.   [place] Pager:  [phone]  Office:  [phone]

## 2019-01-16 NOTE — Social Work (Signed)
Provided pt family with CMS list, they have selected [facility].   Therapy notes and information securely sent via hub.  Christa at [facility] aware and will start auth.  Westley Hummer, MSW, [place] Work [phone]

## 2019-01-16 NOTE — Care Management Note (Signed)
Case Management Note  Patient Details  Name: TAYRON HUNNELL MRN: [id number] Date of Birth: [birth year]  Subjective/Objective:                    Action/Plan:   Expected Discharge Date:  01/16/19               Expected Discharge Plan:  [place]  In-House Referral:     Discharge planning Services  CM Consult  Post Acute Care Choice:    Choice offered to:  Patient, Adult Children, Spouse  DME Arranged:  N/A DME Agency:  NA  HH Arranged:  PT, OT HH Agency:  [facility]  Status of Service:  Completed, signed off  If discussed at [place] of Stay Meetings, dates discussed:    Additional Comments:  Marilu Favre, RN 01/16/2019, 10:03 AM

## 2019-01-16 NOTE — Progress Notes (Signed)
Pharmacy Antibiotic Note  Robert Rowe is a 85 y.o. male on day # 3 Zosyn for intra-abdominal coverage.  Pharmacy has been consulted for transition to Unasyn.   Afebrile, WBC improved.  Plan:  Unasyn 1.5 gm IV q12hrs.  Follow renal function, clinical progress and antibiotic plans.  Height: 5\' 2"  (157.5 cm) Weight: 160 lb (72.6 kg) IBW/kg (Calculated) : 54.6  Temp (24hrs), Avg:97.6 F (36.4 C), Min:97.5 F (36.4 C), Max:97.7 F (36.5 C)  Recent Labs  Lab 01/12/19 2205 01/13/19 0303 01/13/19 0552 01/14/19 0941 01/15/19 0431 01/16/19 0741  WBC 11.4*  --  11.5* 22.0* 17.9* 14.7*  CREATININE 1.55*  --  1.40* 1.59* 1.57*  --   LATICACIDVEN  --  1.2  --   --   --   --     Estimated Creatinine Clearance: 23 mL/min (A) (by C-G formula based on SCr of 1.57 mg/dL (H)).    Allergies  Allergen Reactions  . Codeine Other (See Comments)    Patient was "all over the place"- undesired side effect  . Sulfasalazine Other (See Comments)    Reaction not recalled  . Sulfonamide Derivatives Other (See Comments)    Reaction not recalled    Antimicrobials this admission:   Ceftriaxone x 1 on 2/23   Zosyn 2/24>>2/26   Unasyn 2/26>>   Dose adjustments this admission:  n/a  Microbiology results:  no cultures  Thank you for allowing pharmacy to be a part of this patient's care.  Arty Baumgartner, [place] Pager: [phone] or phone: [phone] 01/16/2019 4:59 PM

## 2019-01-16 NOTE — Progress Notes (Signed)
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  Found to have acute cholecystitis. Discharged from [facility] 01/10/2019 where he was rehabbing from possible CVA and fall.     PT Comments    Pt admitted with above diagnosis. Pt currently with functional limitations due to balance and endurance deficits. Pt was able to tolerate right horizontal canal treatment of BBQ roll for BPPV.  Pt then ambulated with rW.  Needed min assist and cues as pt with unsteady gait and needing assist to steer RW.  CAlled wife who states that pt is worse than PTA and she is worried about taking care of him at home.  REcommend SNF for therapy with vestibular rehab.  Pt will benefit from skilled PT to increase their independence and safety with mobility to allow discharge to the venue listed below.     Follow Up Recommendations  SNF;Supervision/Assistance - 24 hour vestibular rehab     Equipment Recommendations  None recommended by PT    Recommendations for Other Services       Precautions / Restrictions Precautions Precautions: Fall Precaution Comments: less dizzy today overall.  Restrictions Weight Bearing Restrictions: No    Mobility  Bed Mobility Overal bed mobility: Needs Assistance Bed Mobility: Supine to Sit     Supine to sit: Supervision     General bed mobility comments: supervision for safety, increase effort and time   Transfers Overall transfer level: Needs assistance Equipment used: Rolling walker (2 wheeled) Transfers: Sit to/from Stand Sit to Stand: Min assist Stand pivot  transfers: Min assist;+2 safety/equipment       General transfer comment: min guard for safety, less dizziness with standing vc for hand placement for powerup and for bringing RW all the way back to the recliner before sitting.  Pt needed steadying assist initially upon standing as he was slightly off balance with initial stand.   Ambulation/Gait Ambulation/Gait assistance: Min assist Gait Distance (Feet): 45 Feet Assistive device: Rolling walker (2 wheeled) Gait Pattern/deviations: Step-through pattern;Decreased step length - right;Decreased step length - left;Shuffle;Trunk flexed Gait velocity: slowed Gait velocity interpretation: <1.31 ft/sec, indicative of household ambulator General Gait Details: hands on min assist for safety, slow, shuffling gait.  Pt reports this is close to baseline.  He reports that his dizziness was better this am but did get symptoms with treatment however no dizziness with motion after treatment.  Pt is unsteady and requires min assist and states wife can assist at home.     Stairs             Wheelchair Mobility    Modified Rankin (Stroke Patients Only) Modified Rankin (Stroke Patients Only) Pre-Morbid Rankin Score: No symptoms Modified Rankin: Moderately severe disability     Balance Overall balance assessment: Needs assistance Sitting-balance support: Feet supported;No upper extremity supported Sitting balance-Leahy Scale: Fair Sitting balance - Comments: pt able to sit EOB with min guard    Standing balance support: Bilateral upper extremity supported Standing balance-Leahy Scale: Poor Standing balance comment: requires UE support for balance  Cognition Arousal/Alertness: Awake/alert Behavior During Therapy: WFL for tasks assessed/performed Overall Cognitive Status: History of cognitive impairments - at baseline Area of Impairment: Memory                     Memory: Decreased short-term  memory                Exercises      General Comments General comments (skin integrity, edema, etc.): Tested all canals for BPPV with negative testing for posterior and anterior canals.  Pt appeared to test positive for right horizontal canal therefore performed BBQ roll.  Pt seemed to have less dizziness after but difficult to determine as pt does not communicate his symptoms well.       Pertinent Vitals/Pain Pain Assessment: No/denies pain    Home Living                      Prior Function            PT Goals (current goals can now be found in the care plan section) Acute Rehab PT Goals Patient Stated Goal: return home Progress towards PT goals: Progressing toward goals    Frequency    Min 3X/week      PT Plan Discharge plan needs to be updated    Co-evaluation              AM-PAC PT "6 Clicks" Mobility   Outcome Measure  Help needed turning from your back to your side while in a flat bed without using bedrails?: A Little Help needed moving from lying on your back to sitting on the side of a flat bed without using bedrails?: A Little Help needed moving to and from a bed to a chair (including a wheelchair)?: A Little Help needed standing up from a chair using your arms (e.g., wheelchair or bedside chair)?: A Little Help needed to walk in hospital room?: A Little Help needed climbing 3-5 steps with a railing? : A Lot 6 Click Score: 17    End of Session Equipment Utilized During Treatment: Gait belt Activity Tolerance: Patient limited by fatigue Patient left: in chair;with call bell/phone within reach;with chair alarm set Nurse Communication: Mobility status PT Visit Diagnosis: Unsteadiness on feet (R26.81);Other abnormalities of gait and mobility (R26.89);Muscle weakness (generalized) (M62.81);History of falling (Z91.81);Difficulty in walking, not elsewhere classified (R26.2);Dizziness and giddiness (R42)     Time: 6546-5035 PT Time  Calculation (min) (ACUTE ONLY): 38 min  Charges:  $Gait Training: 8-22 mins $Self Care/Home Management: 8-22 $Canalith Rep Proc: 8-22 mins                     [place] Pager:  [phone]  Office:  [phone]     Denice Paradise 01/16/2019, 10:34 AM

## 2019-01-16 NOTE — Clinical Social Work Note (Signed)
Clinical Social Work Assessment  Patient Details  Name: Robert Rowe MRN: [id number] Date of Birth: 1918/10/25  Date of referral:  01/16/19               Reason for consult:  Discharge Planning                Permission sought to share information with:  Facility Sport and exercise psychologist, Family Supports Permission granted to share information::  Yes, Verbal Permission Granted  Name::     Annemarie and Leonides Cave  Agency::  SNFs  Relationship::  wife and son  Contact Information:     Housing/Transportation Living arrangements for the past 2 months:  Apartment Source of Information:  Patient, Spouse, Adult Children Patient Interpreter Needed:  None Criminal Activity/Legal Involvement Pertinent to Current Situation/Hospitalization:  No - Comment as needed Significant Relationships:  Adult Children, Community Support, Spouse Lives with:  Spouse Do you feel safe going back to the place where you live?  Yes Need for family participation in patient care:  Yes (Comment)  Care giving concerns:  Pt from home with his wife, his adult son assists as needed. Pt relatively independent after discharging from [facility]. Since this hospitalization he has gotten weaker and pt family interested in SNF.   Social Worker assessment / plan:  CSW met with pt, pt wife and pt son at bedside. Pt alert but with fluctuating orientation. Received permission to speak with pt family at bedside. CSW introduced self, role, and reason for visit. Pt family expressed concern over level of assistance needed for pt at current time. Pt family prefer SNFs closer to home. They understand that this process will be initiated and they may be asked to make a choice for a SNF relatively quickly for CSW to initiate insurance approval.   Pt family given CMS packet and will look at it and f/u as needed.  Employment status:  Retired Nurse, adult PT Recommendations:  [place], [place] / Referral to community resources:  [place]  Patient/Family's Response to care:  Pt and pt family amenable to speaking with CSW and interested in SNF.  Patient/Family's Understanding of and Emotional Response to Diagnosis, Current Treatment, and Prognosis: Pt and pt family state understanding of diagnosis, current treatment and prognosis. Pt and pt family both emotionally appropriate, albeit stressed about deciding on a SNF placement so quickly.   Emotional Assessment Appearance:  Appears stated age Attitude/Demeanor/Rapport:  Engaged, Gracious, Charismatic Affect (typically observed):  Accepting, Adaptable, Appropriate, Pleasant Orientation:  Oriented to Self, Oriented to Place Alcohol / Substance use:  Not Applicable Psych involvement (Current and /or in the community):  No (Comment)  Discharge Needs  Concerns to be addressed:  Discharge Planning Concerns, Care Coordination Readmission within the last 30 days:  Yes Current discharge risk:  Physical Impairment, Other(advanced age, recent readmit) Barriers to Discharge:  Ship broker, Continued Medical Work up   [employer], [place] 01/16/2019, 1:02 PM

## 2019-01-16 NOTE — Progress Notes (Signed)
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  This was acute in onset, several days after returning home.  In ER, RUQ showed thickened gallbladder, elevated WBC noted.      Assessment & Plan:  Acute cholecystitis RUQ US showed thickening of gallbladder.  HIDA showed occluded cystic duct.  However, clinically, pain completely resolved, WBC improving with antibiotics.  Has tolerated normal diet without pain in last 24 hours. -Consult Gen Surg, appreciate cares -Transition from Zosyn to Unasyn with plans for discharge on Augmentin for 7 days and PCP follow up   Hypokalemia K low -Replete K -Check BMP  Chronic kidney disease stage III Cr stable relative to baseline, 1.5-1.7   Recent acute left occipital CVA -Restart Plavix given no plans for drain placement -Continue aspirin -Resume Lipitor  Chronic diastolic CHF Euvolemic, not on diuretics -Continue metoprolol  Hypothyroidism -Continue levothyroxine      MDM and disposition: The below labs and imaging reports were reviewed and summarized above.  Medication management as above.  The patient was admitted with abdominal pain, found to have cholecystitis.  Now improving on antibiotics.  Will pursue safe placement with SNF and continue antibiotics until WBc normalizes..        DVT prophylaxis: Lovenox Code Status: FULL Family Communication: Son and wife    Consultants:   Gen surg  Procedures:   None  Antimicrobials:   Zosyn --> now Unasyn    Subjective: No abdominal pain, opening, no fever.  No confusion, chest pain, dyspnea.  Objective: Vitals:   01/15/19 1312 01/15/19 2050 01/16/19 0500 01/16/19 1528  BP: 131/84 100/63 106/67 125/84  Pulse: 96 83 80 89  Resp: 16 18 18 18     Temp: 98.8 F (37.1 C) (!) 97.5 F (36.4 C) 97.7 F (36.5 C) (!) 97.5 F (36.4 C)  TempSrc:  Oral Oral Oral  SpO2: 97% 95% 96% 96%  Weight:   72.6 kg   Height:        Intake/Output Summary (Last 24 hours) at 01/16/2019 1734 Last data filed at 01/16/2019 1529 Gross per 24 hour  Intake 952.34 ml  Output 200 ml  Net 752.34 ml   Filed Weights   01/15/19 0502 01/15/19 1015 01/16/19 0500  Weight: 66.1 kg 66.3 kg 72.6 kg    Examination: General appearance:  adult male, alert and in no acute distress.   HEENT: Anicteric, conjunctiva pink, lids and lashes normal. No nasal deformity, discharge, epistaxis.  Lips moist.   Skin: Warm and dry.  No jaundice.  No suspicious rashes or lesions. Cardiac: RRR, nl S1-S2, no murmurs appreciated.  Capillary refill is brisk.  JVP not visible.  No LE edema.  Radia  pulses 2+ and symmetric. Respiratory: Normal respiratory rate and rhythm.  CTAB without rales or wheezes. Abdomen: Abdomen soft.  no TTP. No ascites, distension, hepatosplenomegaly.   MSK: No deformities or effusions. Neuro: Awake and alert.  EOMI, moves all extremities. Speech fluent.    Psych: Sensorium intact and responding to questions, attention normal. Affect normal.  Judgment and insight appear normal.    Data Reviewed: I have personally reviewed following labs and imaging studies:  CBC: Recent Labs  Lab 01/12/19 2205 01/13/19 0552 01/14/19 0941 01/15/19 0431 01/16/19 0741  WBC  11.4* 11.5* 22.0* 17.9* 14.7*  NEUTROABS  --  9.2*  --   --   --   HGB 13.8 13.5 13.9 12.4* 12.9*  HCT 43.5 40.9 42.8 38.9* 40.1  MCV 98.0 94.7 96.8 96.3 95.5  PLT 248 256 242 222 465   Basic Metabolic Panel: Recent Labs  Lab 01/12/19 2205 01/13/19 0552 01/14/19 0941 01/15/19 0431  NA 139 140 142 141  K 4.0 3.7 3.6 3.1*  CL 106 104 110 108  CO2 24 21* 24 20*  GLUCOSE 155* 154* 145* 126*  BUN 18 15 14 15   CREATININE 1.55* 1.40* 1.59* 1.57*  CALCIUM 8.7* 8.6* 8.7* 8.4*  MG  --   --    --  1.7   GFR: Estimated Creatinine Clearance: 23 mL/min (A) (by C-G formula based on SCr of 1.57 mg/dL (H)). Liver Function Tests: Recent Labs  Lab 01/12/19 2205 01/13/19 0552 01/14/19 0941 01/15/19 0431  AST 17 19 25 19   ALT 14 15 19 16   ALKPHOS 78 77 66 58  BILITOT 0.6 0.6 1.0 1.1  PROT 6.2* 6.4* 6.0* 5.4*  ALBUMIN 3.4* 3.4* 3.1* 2.6*   Recent Labs  Lab 01/12/19 2205  LIPASE 42   No results for input(s): AMMONIA in the last 168 hours. Coagulation Profile: No results for input(s): INR, PROTIME in the last 168 hours. Cardiac Enzymes: Recent Labs  Lab 01/12/19 2205 01/13/19 0303  TROPONINI <0.03 <0.03   BNP (last 3 results) No results for input(s): PROBNP in the last 8760 hours. HbA1C: No results for input(s): HGBA1C in the last 72 hours. CBG: Recent Labs  Lab 01/13/19 0810 01/14/19 0814 01/15/19 0753  GLUCAP 139* 138* 118*   Lipid Profile: No results for input(s): CHOL, HDL, LDLCALC, TRIG, CHOLHDL, LDLDIRECT in the last 72 hours. Thyroid Function Tests: No results for input(s): TSH, T4TOTAL, FREET4, T3FREE, THYROIDAB in the last 72 hours. Anemia Panel: No results for input(s): VITAMINB12, FOLATE, FERRITIN, TIBC, IRON, RETICCTPCT in the last 72 hours. Urine analysis:    Component Value Date/Time   COLORURINE YELLOW 01/13/2019 0027   APPEARANCEUR CLEAR 01/13/2019 0027   LABSPEC 1.025 01/13/2019 0027   PHURINE 6.0 01/13/2019 0027   GLUCOSEU NEGATIVE 01/13/2019 0027   HGBUR NEGATIVE 01/13/2019 0027   BILIRUBINUR NEGATIVE 01/13/2019 0027   KETONESUR NEGATIVE 01/13/2019 0027   PROTEINUR NEGATIVE 01/13/2019 0027   UROBILINOGEN 0.2 08/27/2015 1930   NITRITE NEGATIVE 01/13/2019 0027   LEUKOCYTESUR NEGATIVE 01/13/2019 0027   Sepsis Labs: @LABRCNTIP (procalcitonin:4,lacticacidven:4)  )No results found for this or any previous visit (from the past 240 hour(s)).       Radiology Studies: No results found.      Scheduled Meds: . aspirin EC  81 mg  Oral Daily  . docusate sodium  100 mg Oral Daily  . famotidine  20 mg Oral Daily  . heparin  5,000 Units Subcutaneous Q8H  . latanoprost  1 drop Both Eyes QHS  . levothyroxine  50 mcg Oral QAC breakfast  . metoprolol succinate  25 mg Oral QHS  . polyethylene glycol  17 g Oral Daily   Continuous Infusions: . sodium chloride Stopped (01/16/19 0640)  . ampicillin-sulbactam (UNASYN) IV       LOS: 2 days    Time spent: 25 minutes    Edwin Dada, MD [facility] 01/16/2019, 5:34 PM     Please page through [place]:  [email or web address] Password [email or web address] If 7PM-7AM, please contact night-coverage

## 2019-01-16 NOTE — Progress Notes (Signed)
Patient ID: Robert Rowe, male   DOB: 05-14-1918, 85 y.o.   MRN: [id number]     Subjective: Continues to deny any abdominal pain or GI complaints since just after admission.  He is up out of bed and eating a regular breakfast.  Wants to go home and see his wife.  Objective: Vital signs in last 24 hours: Temp:  [97.5 F (36.4 C)-98.8 F (37.1 C)] 97.7 F (36.5 C) (02/26 0500) Pulse Rate:  [80-96] 80 (02/26 0500) Resp:  [16-18] 18 (02/26 0500) BP: (100-131)/(63-84) 106/67 (02/26 0500) SpO2:  [95 %-97 %] 96 % (02/26 0500) Weight:  [66.3 kg-72.6 kg] 72.6 kg (02/26 0500) Last BM Date: 01/10/19  Intake/Output from previous day: 02/25 0701 - 02/26 0700 In: 845.3 [P.O.:480; I.V.:108.8; IV Piggyback:256.6] Out: 300 [Urine:300] Intake/Output this shift: No intake/output data recorded.  General appearance: alert, cooperative and no distress GI: Mild distention.  Soft and without any tenderness to deep palpation.  No masses.  Lab Results:  Recent Labs    01/15/19 0431 01/16/19 0741  WBC 17.9* 14.7*  HGB 12.4* 12.9*  HCT 38.9* 40.1  PLT 222 242   BMET Recent Labs    01/14/19 0941 01/15/19 0431  NA 142 141  K 3.6 3.1*  CL 110 108  CO2 24 20*  GLUCOSE 145* 126*  BUN 14 15  CREATININE 1.59* 1.57*  CALCIUM 8.7* 8.4*     Studies/Results: No results found.  Anti-infectives: Anti-infectives (From admission, onward)   Start     Dose/Rate Route Frequency Ordered Stop   01/14/19 1400  piperacillin-tazobactam (ZOSYN) IVPB 3.375 g     3.375 g 12.5 mL/hr over 240 Minutes Intravenous Every 8 hours 01/14/19 1041     01/13/19 1500  cefTRIAXone (ROCEPHIN) 2 g in sodium chloride 0.9 % 100 mL IVPB  Status:  Discontinued     2 g 200 mL/hr over 30 Minutes Intravenous Every 24 hours 01/13/19 1417 01/14/19 1041      Assessment/Plan: Acute abdominal pain consistent with cholecystitis although CT and ultrasound entirely negative.  HIDA was positive for non-with his gallbladder.  Has  been asymptomatic and negative exam for several days.  Significant leukocytosis which appears to be resolving. I would not recommend intervention.  Would consider discharge finishing a course of oral antibiotics for a week or so.    LOS: 2 days    Edward Jolly 01/16/2019

## 2019-01-16 NOTE — NC FL2 (Signed)
[place] LEVEL OF CARE SCREENING TOOL     IDENTIFICATION  Patient Name: Robert Rowe Birthdate: January 25, 1918 Sex: male Admission Date (Current Location): 01/12/2019  [facility] and [place] Number:  Herbalist and Address:  [facility] [place], [place] [address] [place], [place] [address]      Provider Number: [id number]  Attending Physician Name and Address:  Edwin Dada, *  Relative Name and Phone Number:  Xaivier Malay; wife; [phone]    Current Level of Care: Hospital Recommended Level of Care: [place] Prior Approval Number:    Date Approved/Denied:   PASRR Number: [id number]  Discharge Plan: SNF    Current Diagnoses: Patient Active Problem List   Diagnosis Date Noted  . Acute cholecystitis 01/14/2019  . Cholecystitis   . Intractable abdominal pain 01/13/2019  . 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 Gait disturbance, post-stroke   . BPV (benign positional vertigo) 01/02/2019  . History of stroke 01/01/2019  . TIA (transient ischemic attack) 12/31/2018  . CVA (cerebral vascular accident) ([place]) 12/31/2018  . Chest pain 05/15/2018  . CKD (chronic kidney disease) stage 3, GFR 30-59 ml/min (HCC) 05/08/2018  . Other fatigue 06/13/2017  . Abnormal diffusion capacity determined by pulmonary function test 06/13/2017  . Chronic diastolic CHF (congestive heart failure) ([place]) 11/01/2016  . Essential hypertension 11/01/2016  . Intracranial vascular stenosis 11/09/2015  . Mild cognitive impairment 11/09/2015  . 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 Benign prostatic hyperplasia with urinary obstruction 03/19/2015  . Awareness alteration, transient 02/26/2015  . PVC's (premature ventricular contractions) 08/22/2013  . Hypothyroidism 05/09/2013  . Stricture and stenosis of esophagus 03/12/2013  . Dysphagia, unspecified(787.20) 02/22/2013  . DOE (dyspnea on exertion) 03/23/2011  .  PALPITATIONS 02/12/2010  . CHEST DISCOMFORT 04/25/2009  . PEDAL EDEMA 05/01/2008  . GERD 12/03/2007  . Dyslipidemia 04/05/2007  . BENIGN PROSTATIC HYPERTROPHY 04/05/2007  . Rheumatoid arthritis ([place]) 04/05/2007  . Osteoarthritis 04/05/2007  . History of cardiovascular disorder 04/05/2007    Orientation RESPIRATION BLADDER Height & Weight     Self, Place  Normal Continent Weight: 160 lb (72.6 kg) Height:  5\' 2"  (157.5 cm)  BEHAVIORAL SYMPTOMS/MOOD NEUROLOGICAL BOWEL NUTRITION STATUS      Continent Diet(see discharge summary)  AMBULATORY STATUS COMMUNICATION OF NEEDS Skin   Extensive Assist Verbally Skin abrasions, Other (Comment)(abrasion on right knee with foam; MASD on groin with barrier cream; ecchymosis on bilateral hands and arms)                       Personal Care Assistance Level of Assistance  Bathing, Feeding, Dressing Bathing Assistance: Maximum assistance Feeding assistance: Independent Dressing Assistance: Maximum assistance     Functional Limitations Info  Sight, Hearing, Speech Sight Info: Impaired(blind left eye) Hearing Info: Adequate Speech Info: Adequate    SPECIAL CARE FACTORS FREQUENCY  PT (By licensed PT), OT (By licensed OT)     PT Frequency: 5x week OT Frequency: 5x week            Contractures Contractures Info: Not present    Additional Factors Info  Code Status, Allergies Code Status Info: Full Code Allergies Info: CODEINE, SULFASALAZINE, SULFONAMIDE DERIVATIVES            Current Medications (01/16/2019):  This is the current hospital active medication list Current Facility-Administered Medications  Medication Dose Route Frequency Provider Last Rate Last Dose  . 0.9 %  sodium chloride infusion   Intravenous PRN  Cristal Ford, DO   Stopped at 01/16/19 [phone]  . acetaminophen (TYLENOL) tablet 650 mg  650 mg Oral Q6H PRN Opyd, Ilene Qua, MD       Or  . acetaminophen (TYLENOL) suppository 650 mg  650 mg Rectal Q6H PRN Opyd,  Ilene Qua, MD      . aspirin EC tablet 81 mg  81 mg Oral Daily Eulogio Bear U, DO   81 mg at 01/16/19 0806  . famotidine (PEPCID) IVPB 20 mg premix  20 mg Intravenous Daily Eulogio Bear U, DO   Stopped at 01/15/19 1102  . fentaNYL (SUBLIMAZE) injection 12.5-25 mcg  12.5-25 mcg Intravenous Q2H PRN Opyd, Ilene Qua, MD      . heparin injection 5,000 Units  5,000 Units Subcutaneous Q8H Opyd, Ilene Qua, MD   5,000 Units at 01/16/19 [phone]  . hydrALAZINE (APRESOLINE) injection 10 mg  10 mg Intravenous Q6H PRN Bodenheimer, Charles A, NP   10 mg at 01/13/19 0739  . latanoprost (XALATAN) 0.005 % ophthalmic solution 1 drop  1 drop Both Eyes QHS Vann, Jessica U, DO   1 drop at 01/15/19 2223  . levothyroxine (SYNTHROID, LEVOTHROID) tablet 50 mcg  50 mcg Oral QAC breakfast Eulogio Bear U, DO   50 mcg at 01/16/19 1761  . metoprolol succinate (TOPROL-XL) 24 hr tablet 25 mg  25 mg Oral QHS Vann, Jessica U, DO   25 mg at 01/15/19 2222  . ondansetron (ZOFRAN) tablet 4 mg  4 mg Oral Q6H PRN Opyd, Ilene Qua, MD       Or  . ondansetron (ZOFRAN) injection 4 mg  4 mg Intravenous Q6H PRN Opyd, Ilene Qua, MD      . piperacillin-tazobactam (ZOSYN) IVPB 3.375 g  3.375 g Intravenous Q8H Rayburn, Kelly A, PA-C 12.5 mL/hr at 01/16/19 0644    . polyethylene glycol (MIRALAX / GLYCOLAX) packet 17 g  17 g Oral Daily Eulogio Bear U, DO   17 g at 01/16/19 6073     Discharge Medications: Please see discharge summary for a list of discharge medications.  Relevant Imaging Results:  Relevant Lab Results:   Additional Information SS#[id number] [place], [place]

## 2019-01-17 ENCOUNTER — Non-Acute Institutional Stay (SKILLED_NURSING_FACILITY): Payer: Medicare Other | Admitting: Internal Medicine

## 2019-01-17 ENCOUNTER — Encounter: Payer: Self-pay | Admitting: Internal Medicine

## 2019-01-17 DIAGNOSIS — K810 Acute cholecystitis: Secondary | ICD-10-CM

## 2019-01-17 DIAGNOSIS — I5032 Chronic diastolic (congestive) heart failure: Secondary | ICD-10-CM | POA: Diagnosis not present

## 2019-01-17 DIAGNOSIS — K219 Gastro-esophageal reflux disease without esophagitis: Secondary | ICD-10-CM | POA: Diagnosis not present

## 2019-01-17 DIAGNOSIS — I10 Essential (primary) hypertension: Secondary | ICD-10-CM | POA: Diagnosis not present

## 2019-01-17 DIAGNOSIS — H8110 Benign paroxysmal vertigo, unspecified ear: Secondary | ICD-10-CM | POA: Diagnosis not present

## 2019-01-17 DIAGNOSIS — E876 Hypokalemia: Secondary | ICD-10-CM | POA: Diagnosis not present

## 2019-01-17 DIAGNOSIS — N183 Chronic kidney disease, stage 3 (moderate): Secondary | ICD-10-CM | POA: Diagnosis not present

## 2019-01-17 DIAGNOSIS — M1990 Unspecified osteoarthritis, unspecified site: Secondary | ICD-10-CM | POA: Diagnosis not present

## 2019-01-17 DIAGNOSIS — Z8673 Personal history of transient ischemic attack (TIA), and cerebral infarction without residual deficits: Secondary | ICD-10-CM

## 2019-01-17 DIAGNOSIS — I130 Hypertensive heart and chronic kidney disease with heart failure and stage 1 through stage 4 chronic kidney disease, or unspecified chronic kidney disease: Secondary | ICD-10-CM | POA: Diagnosis not present

## 2019-01-17 DIAGNOSIS — Z7982 Long term (current) use of aspirin: Secondary | ICD-10-CM | POA: Diagnosis not present

## 2019-01-17 DIAGNOSIS — M6281 Muscle weakness (generalized): Secondary | ICD-10-CM | POA: Diagnosis not present

## 2019-01-17 DIAGNOSIS — E785 Hyperlipidemia, unspecified: Secondary | ICD-10-CM | POA: Diagnosis not present

## 2019-01-17 DIAGNOSIS — I63532 Cerebral infarction due to unspecified occlusion or stenosis of left posterior cerebral artery: Secondary | ICD-10-CM | POA: Diagnosis not present

## 2019-01-17 DIAGNOSIS — K812 Acute cholecystitis with chronic cholecystitis: Secondary | ICD-10-CM | POA: Diagnosis not present

## 2019-01-17 DIAGNOSIS — E039 Hypothyroidism, unspecified: Secondary | ICD-10-CM | POA: Diagnosis not present

## 2019-01-17 LAB — CBC
HCT: 37 % — ABNORMAL LOW (ref 39.0–52.0)
Hemoglobin: 12.2 g/dL — ABNORMAL LOW (ref 13.0–17.0)
MCH: 31.5 pg (ref 26.0–34.0)
MCHC: 33 g/dL (ref 30.0–36.0)
MCV: 95.6 fL (ref 80.0–100.0)
Platelets: 239 10*3/uL (ref 150–400)
RBC: 3.87 MIL/uL — ABNORMAL LOW (ref 4.22–5.81)
RDW: 12.9 % (ref 11.5–15.5)
WBC: 11.4 10*3/uL — ABNORMAL HIGH (ref 4.0–10.5)
nRBC: 0 % (ref 0.0–0.2)

## 2019-01-17 LAB — BASIC METABOLIC PANEL
Anion gap: 10 (ref 5–15)
BUN: 14 mg/dL (ref 8–23)
CO2: 20 mmol/L — ABNORMAL LOW (ref 22–32)
Calcium: 8.3 mg/dL — ABNORMAL LOW (ref 8.9–10.3)
Chloride: 107 mmol/L (ref 98–111)
Creatinine, Ser: 1.46 mg/dL — ABNORMAL HIGH (ref 0.61–1.24)
GFR calc Af Amer: 46 mL/min — ABNORMAL LOW (ref >60)
GFR calc non Af Amer: 39 mL/min — ABNORMAL LOW (ref >60)
Glucose, Bld: 115 mg/dL — ABNORMAL HIGH (ref 70–99)
Potassium: 3.8 mmol/L (ref 3.5–5.1)
Sodium: 137 mmol/L (ref 135–145)

## 2019-01-17 LAB — GLUCOSE, CAPILLARY: Glucose-Capillary: 129 mg/dL — ABNORMAL HIGH (ref 70–99)

## 2019-01-17 MED ORDER — AMOXICILLIN-POT CLAVULANATE 875-125 MG PO TABS: 1.0000 | ORAL_TABLET | Freq: Two times a day (BID) | ORAL | 0 refills | Status: DC

## 2019-01-17 NOTE — Social Work (Signed)
Auth received for pt to discharge to [facility]  Attempted to call pt son, no answer. Message left informing him Josem Kaufmann had been received for pt.  Westley Hummer, MSW, [place] Work [phone]

## 2019-01-17 NOTE — Progress Notes (Signed)
NURSING HOME LOCATION:  [facility] ROOM NUMBER:  [id number]  PCP:  Carolann Littler, MD  This is a comprehensive admission note to [facility] performed on this date less than 30 days from date of admission. Included are preadmission medical/surgical history; reconciled medication list; family history; social history and comprehensive review of systems.  Corrections and additions to the records were documented. Comprehensive physical exam was also performed. Additionally a clinical summary was entered for each active diagnosis pertinent to this admission in the Problem List to enhance continuity of care.  HPI: Patient was hospitalized 2/22-2/27/2020 for acute abdominal pain present several days after he returned home from hospitalization for recent stroke.  Ultrasound suggested thickened gallbladder and white count was elevated at 22,000.  HIDA scan revealed an occluded cystic duct suggesting calculus disease as etiology.  The pain did resolve and the white count improved.  Initially he was treated with IV Zosyn which was transitioned to Augmentin.  He was able to tolerate normal diet without pain for 48 hours.  General surgery felt  Surgical intervention was not indicated.  Additionally there was concern about significant swings in his blood pressure as a possible risk factor given his recent stroke.  While hospitalized he did have some benign positional vertigo for which positional maneuvers were initiated as well as meclizine.  He was discharged to the SNF for observation and PT/OT  Past medical and surgical history: Includes rheumatoid arthritis, dyslipidemia, CKD stage III, recent acute left occipital CVA, chronic diastolic heart failure, hypothyroidism, and posttraumatic blindness of the left eye. Surgeries include TURP , prostate cryoablation, esophageal dilation and cataract extraction.  Social history: Social drinker, former smoker.  Family history: Reviewed , noncontributory  due to age   Review of systems: He denies any active GI symptoms at this time.  His benign positional vertigo has resolved. Constitutional: No fever, significant weight change, fatigue  Eyes: No redness, discharge, pain, vision change ENT/mouth: No nasal congestion, purulent discharge, earache, change in hearing, sore throat  Cardiovascular: No chest pain, palpitations, paroxysmal nocturnal dyspnea, claudication, edema  Respiratory: No cough, sputum production, hemoptysis, DOE, significant snoring, apnea Gastrointestinal: No heartburn, dysphagia, abdominal pain, nausea /vomiting, rectal bleeding, melena, change in bowels Genitourinary: No dysuria, hematuria, pyuria, incontinence, nocturia Musculoskeletal: No joint stiffness, joint swelling, weakness, pain Dermatologic: No rash, pruritus, change in appearance of skin Neurologic: No dizziness, headache, syncope, seizures, numbness, tingling Psychiatric: No significant anxiety, depression, insomnia, anorexia Endocrine: No change in hair/skin/nails, excessive thirst, excessive hunger, excessive urination  Hematologic/lymphatic: No significant bruising, lymphadenopathy, abnormal bleeding Allergy/immunology: No itchy/watery eyes, significant sneezing, urticaria, angioedema  Physical exam:  Pertinent or positive findings: He appears younger than his stated age.  Pattern alopecia is present.  He has arcus senilis.  The left pupil is distorted.  He has an upper plate.  He has immaculate crowns of the mandibular teeth.  He has diffuse low-grade wheezing in a scattered distribution.  Abdomen is nontender.  Trace edema is present in the lower extremities.  Pedal pulses are decreased.  The upper extremities are weaker to opposition than the lower extremities.  General appearance: Adequately nourished; no acute distress, increased work of breathing is present.   Lymphatic: No lymphadenopathy about the head, neck, axilla. Eyes: No conjunctival inflammation or  lid edema is present. There is no scleral icterus. Ears:  External ear exam shows no significant lesions or deformities.   Nose:  External nasal examination shows no deformity or inflammation. Nasal mucosa are pink  and moist without lesions, exudates Oral exam: Lips and gums are healthy appearing.There is no oropharyngeal erythema or exudate. Neck:  No thyromegaly, masses, tenderness noted.    Heart:  Normal rate and regular rhythm. S1 and S2 normal without gallop, murmur, click, rub.  Lungs:  without  rhonchi, rales, rubs. Abdomen: Bowel sounds are normal.  Abdomen is soft and nontender with no organomegaly, hernias, masses. GU: Deferred  Extremities:  No cyanosis, clubbing Neurologic exam:  Balance, Rhomberg, finger to nose testing could not be completed due to clinical state Deep tendon reflexes are equal Skin: Warm & dry w/o tenting. No significant lesions or rash.  See clinical summary under each active problem in the Problem List with associated updated therapeutic plan

## 2019-01-17 NOTE — Progress Notes (Signed)
CC: Abdominal pain  Subjective: Patient is doing well no abdominal pain.  He was a bit disconcerted over not having his phone available and being in the room by himself but otherwise doing well.  Objective: Vital signs in last 24 hours: Temp:  [97.5 F (36.4 C)-97.9 F (36.6 C)] 97.9 F (36.6 C) (02/27 0621) Pulse Rate:  [88-91] 91 (02/27 0621) Resp:  [16-18] 18 (02/27 0621) BP: (116-125)/(63-84) 124/63 (02/27 0621) SpO2:  [92 %-97 %] 92 % (02/27 0621) Weight:  [74.8 kg] 74.8 kg (02/27 0621) Last BM Date: 01/16/19  Intake/Output from previous day: 02/26 0701 - 02/27 0700 In: 787 [P.O.:587; IV Piggyback:200] Out: 450 [Urine:450] Intake/Output this shift: No intake/output data recorded.  General appearance: alert, cooperative and no distress GI: soft, non-tender; bowel sounds normal; no masses,  no organomegaly  Lab Results:  Recent Labs    01/16/19 0741 01/17/19 0327  WBC 14.7* 11.4*  HGB 12.9* 12.2*  HCT 40.1 37.0*  PLT 242 239    BMET Recent Labs    01/15/19 0431 01/17/19 0327  NA 141 137  K 3.1* 3.8  CL 108 107  CO2 20* 20*  GLUCOSE 126* 115*  BUN 15 14  CREATININE 1.57* 1.46*  CALCIUM 8.4* 8.3*   PT/INR No results for input(s): LABPROT, INR in the last 72 hours.  Recent Labs  Lab 01/12/19 2205 01/13/19 0552 01/14/19 0941 01/15/19 0431  AST 17 19 25 19   ALT 14 15 19 16   ALKPHOS 78 77 66 58  BILITOT 0.6 0.6 1.0 1.1  PROT 6.2* 6.4* 6.0* 5.4*  ALBUMIN 3.4* 3.4* 3.1* 2.6*     Lipase     Component Value Date/Time   LIPASE 42 01/12/2019 2205     Medications: . aspirin EC  81 mg Oral Daily  . atorvastatin  80 mg Oral q1800  . clopidogrel  75 mg Oral Daily  . docusate sodium  100 mg Oral Daily  . famotidine  20 mg Oral Daily  . heparin  5,000 Units Subcutaneous Q8H  . latanoprost  1 drop Both Eyes QHS  . levothyroxine  50 mcg Oral QAC breakfast  . metoprolol succinate  25 mg Oral QHS  . polyethylene glycol  17 g Oral Daily   .  sodium chloride Stopped (01/16/19 0640)  . ampicillin-sulbactam (UNASYN) IV Stopped (01/17/19 0556)   Anti-infectives (From admission, onward)   Start     Dose/Rate Route Frequency Ordered Stop   01/16/19 1700  ampicillin-sulbactam (UNASYN) 1.5 g in sodium chloride 0.9 % 100 mL IVPB     1.5 g 200 mL/hr over 30 Minutes Intravenous Every 12 hours 01/16/19 1556     01/14/19 1400  piperacillin-tazobactam (ZOSYN) IVPB 3.375 g  Status:  Discontinued     3.375 g 12.5 mL/hr over 240 Minutes Intravenous Every 8 hours 01/14/19 1041 01/16/19 1540   01/13/19 1500  cefTRIAXone (ROCEPHIN) 2 g in sodium chloride 0.9 % 100 mL IVPB  Status:  Discontinued     2 g 200 mL/hr over 30 Minutes Intravenous Every 24 hours 01/13/19 1417 01/14/19 1041        Assessment/Plan Chronic kidney disease stage III Recent acute left occipital CVA  -Restarting Plavix/aspirin/Lipitor Chronic diastolic congestive heart failure -euvolemic Hypothyroid -levothyroxine supplement   Abdominal pain consistent with acute cholecystitis  -HIDA positive  -Resolving with antibiotics  FEN: Heart healthy diet ID: Unasyn 2/26>> day 2; ceftriaxone x1 dose (2/23); Zosyn 2/24-2/26  -Recommending total 7 days antibiotic therapy  Looks DVT: Heparin Follow-up: PRN   Plan: No surgical intervention required.  Recommendations to complete a 7-day course of antibiotics.    LOS: 3 days    Robert Rowe 01/17/2019 [phone]

## 2019-01-17 NOTE — Progress Notes (Signed)
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  Found to have acute cholecystitis. Discharged from [place] 01/10/2019 where he was rehabbing from possible CVA and fall.     PT Comments    Pt admitted with above diagnosis. Pt currently with functional limitations due to balance and endurance deficits. Pt's vertigo is resolved.  Pt was able to ambulate with overall min assist for safety with RW. As pt fatigues, he becomes less safe needing incr cuing and assist.  Will continue acute PT.  Pt will benefit from skilled PT to increase their independence and safety with mobility to allow discharge to the venue listed below.     Follow Up Recommendations  SNF;Supervision/Assistance - 24 hour     Equipment Recommendations  None recommended by PT    Recommendations for Other Services       Precautions / Restrictions Precautions Precautions: Fall Precaution Comments: no dizziness today per pt Restrictions Weight Bearing Restrictions: No    Mobility  Bed Mobility               General bed mobility comments: Pt in chair on arrival.   Transfers Overall transfer level: Needs assistance Equipment used: Rolling walker (2 wheeled) Transfers: Sit to/from Stand Sit to Stand: Min assist;Min guard         General transfer comment: min guard for safety and min vc for hand placement for powerup. Overall pt more steady today.   Ambulation/Gait Ambulation/Gait assistance: Min assist Gait Distance (Feet): 110 Feet Assistive device: Rolling walker (2 wheeled) Gait  Pattern/deviations: Step-through pattern;Decreased step length - right;Decreased step length - left;Shuffle;Trunk flexed Gait velocity: slowed Gait velocity interpretation: <1.31 ft/sec, indicative of household ambulator General Gait Details: hands on min assist for safety, slow, shuffling gait. Pt fatigues and has decr postural stability as he fatigues as well as decr safety as he begins to rush back to chair.   Family reports that this is worse than his ambulation at home especially the fatigue.  He reports that his dizziness is resolved. Wife and son present for entire session and they are very happy that pt is going for more therapy.     Stairs             Wheelchair Mobility    Modified Rankin (Stroke Patients Only) Modified Rankin (Stroke Patients Only) Pre-Morbid Rankin Score: No symptoms Modified Rankin: Moderately severe disability     Balance Overall balance assessment: Needs assistance Sitting-balance support: Feet supported;No upper extremity supported Sitting balance-Leahy Scale: Fair Sitting balance - Comments: pt able to sit EOB with min guard    Standing balance support: Bilateral upper extremity supported Standing balance-Leahy Scale: Poor Standing balance comment: requires UE support for balance                            Cognition Arousal/Alertness: Awake/alert Behavior During Therapy: WFL for tasks assessed/performed Overall Cognitive Status: History of cognitive impairments - at baseline Area of Impairment: Memory                     Memory: Decreased short-term memory  Exercises General Exercises - Lower Extremity Ankle Circles/Pumps: AROM;Both;10 reps;Seated Long Arc Quad: AROM;Strengthening;Both;Seated;20 reps Hip Flexion/Marching: AROM;Strengthening;Both;Seated;10 reps;Standing Mini-Sqauts: AROM;Both;5 reps;Standing Other Exercises Other Exercises: pt performed 5x sit<>stand with min guard and cueing for  hand placement; progressing from 2 UE supports on arm rests of chair to hands in lap    General Comments General comments (skin integrity, edema, etc.): No further vestibular treatment necessary.       Pertinent Vitals/Pain Pain Assessment: No/denies pain    Home Living                      Prior Function            PT Goals (current goals can now be found in the care plan section) Acute Rehab PT Goals Patient Stated Goal: return home Progress towards PT goals: Progressing toward goals    Frequency    Min 3X/week      PT Plan Current plan remains appropriate    Co-evaluation              AM-PAC PT "6 Clicks" Mobility   Outcome Measure  Help needed turning from your back to your side while in a flat bed without using bedrails?: A Little Help needed moving from lying on your back to sitting on the side of a flat bed without using bedrails?: A Little Help needed moving to and from a bed to a chair (including a wheelchair)?: A Little Help needed standing up from a chair using your arms (e.g., wheelchair or bedside chair)?: A Little Help needed to walk in hospital room?: A Little Help needed climbing 3-5 steps with a railing? : A Lot 6 Click Score: 17    End of Session Equipment Utilized During Treatment: Gait belt Activity Tolerance: Patient limited by fatigue Patient left: in chair;with call bell/phone within reach;with chair alarm set;with family/visitor present Nurse Communication: Mobility status PT Visit Diagnosis: Unsteadiness on feet (R26.81);Other abnormalities of gait and mobility (R26.89);Muscle weakness (generalized) (M62.81);History of falling (Z91.81);Difficulty in walking, not elsewhere classified (R26.2);Dizziness and giddiness (R42)     Time: 6387-5643 PT Time Calculation (min) (ACUTE ONLY): 34 min  Charges:  $Gait Training: 8-22 mins $Therapeutic Exercise: 8-22 mins                     [place] Pager:  [phone]  Office:  [place] 01/17/2019, 12:12 PM

## 2019-01-17 NOTE — Assessment & Plan Note (Addendum)
Currently asymptomatic; meclizine ordered as needed.  Additionally treated with postural maneuvers with good response.

## 2019-01-17 NOTE — Discharge Summary (Signed)
Physician Discharge Summary  Robert Rowe RSW:[id number] DOB: 1918/07/12 DOA: 01/12/2019  PCP: [name], MD  Admit date: 01/12/2019 Discharge date: 01/17/2019  Admitted From: Home  Disposition:  SNF [facility]   Recommendations for Outpatient Follow-up:  1. Follow up with PCP in 1 week 2. Give Lasix 20 mg PO once daily for two days (Friday and Saturday) then resume only as needed for swelling 3. Please obtain BMP/CBC on Sunday Mar 1 to check renal function and WBC 4. 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  This was acute in onset, several days after returning home.  In ER, RUQ showed thickened gallbladder, elevated WBC noted.     PRINCIPAL HOSPITAL DIAGNOSIS: Acute (on chronic?) cholecystitis    Discharge Diagnoses:   Acute cholecystitis Admitted with acute abdominal pain, found day after admission to have Leukocytosis 22K.  RUQ US showed thickening of gallbladder.  HIDA showed occluded cystic duct suggesting this was calculous disease.    However, clinically, pain completely resolved, WBC improved with antibiotics(IV Zosyn) and he tolerated a normal diet without pain for 48 hours.   General Surgery consulted, but cholecystectomy was deferred given clinical improvement almost immediately on admission, as well as concerns about BP swings with surgery, given his recent stroke.  Percutaneous cholecystostomy was also considered, but ultimately deferred given clear improvement with antibiotics alone.  Discharge on Augmentin for 7 days and PCP follow up in 1 week.   Hypokalemia Resolved  Chronic kidney disease stage III Cr stable relative to baseline,  1.5-1.7    Recent acute left occipital CVA Continued on Plavix, aspirin and Lipitor.  Should be on DAPT until mid-May, then Plavix alone indefeinitely.    Chronic diastolic CHF Appeared euvolemic.     Hypothyroidism            Discharge Instructions  Discharge Instructions    Diet - low sodium heart healthy   Complete by:  As directed    Discharge instructions   Complete by:  As directed    From Dr. Loleta Books: You were admitted for abdominal pain. Your ultrasound showed that your gallbladder was thickened, as if it was infected.  However, your pain resolved quickly and completely, which is not consistent with a classical cholecystitis. Our follow up testing though, the HIDA nuclear scan, showed that the cystic block at that time was blocked.    Because you continued to get better, day by day in the hospital, our best guess is that you had a transient blockage, and a slow smoldering inflammation of the gallbladder.   You were treated with antibiotics and your white blood cell count has improved to near normal (from a peak of 22K). When you leave, you should take Augmentin 875-125 twice daily for 7 more days  No particular follow up is needed with the General Surgeon. Call your primary care doctor for an appointment in 1-2 weeks Discuss with Dr. Elease Hashimoto if you have worsening or persistent pain returning, and this would be a reason to call the surgeon for a follow up appointment, Dr. Excell Seltzer at [facility] [phone]  If at any point you have severe pain, fever or persistent vomiting, return  to the ER.    For your wheezing, take Lasix 20 mg on schedule for the next two days (Friday and Saturday) Repeat a complete blood count and metabolic panel on Sunday, Mar 1   Increase activity slowly   Complete by:  As directed      Allergies as of 01/17/2019      Reactions   Codeine Other (See Comments)   Patient was "all over the place"- undesired side effect    Sulfasalazine Other (See Comments)   Reaction not recalled   Sulfonamide Derivatives Other (See Comments)   Reaction not recalled      Medication List    TAKE these medications   acetaminophen 325 MG tablet Commonly known as:  TYLENOL Take 2 tablets (650 mg total) by mouth every 6 (six) hours as needed for mild pain (or Fever >/= 101).   albuterol 108 (90 Base) MCG/ACT inhaler Commonly known as:  PROVENTIL HFA;VENTOLIN HFA Inhale 2 puffs into the lungs every 4 (four) hours as needed for wheezing or shortness of breath.   amoxicillin-clavulanate 875-125 MG tablet Commonly known as:  AUGMENTIN Take 1 tablet by mouth 2 (two) times daily for 7 days.   aspirin 81 MG EC tablet Take 1 tablet (81 mg total) by mouth daily for 30 days.   atorvastatin 80 MG tablet Commonly known as:  LIPITOR Take 1 tablet (80 mg total) by mouth daily at 6 PM for 30 days.   clopidogrel 75 MG tablet Commonly known as:  PLAVIX Take 1 tablet (75 mg total) by mouth daily.   furosemide 20 MG tablet Commonly known as:  LASIX TAKE 1 TABLET BY MOUTH  DAILY AS NEEDED FOR FLUID  OR EDEMA. What changed:  See the new instructions.   latanoprost 0.005 % ophthalmic solution Commonly known as:  XALATAN Place 1 drop into both eyes at bedtime.   levothyroxine 50 MCG tablet Commonly known as:  SYNTHROID, LEVOTHROID TAKE 1 TABLET BY MOUTH  DAILY What changed:    how much to take  how to take this  when to take this  additional instructions   meclizine 12.5 MG tablet Commonly known as:  ANTIVERT Take 1 tablet (12.5 mg total) by mouth 3 (three) times daily as needed for dizziness.   metoprolol succinate 25 MG 24 hr tablet Commonly known as:  TOPROL-XL Take 1 tablet (25 mg total) by mouth at bedtime.   multivitamin with minerals Tabs tablet Take 1 tablet by mouth 2 (two) times a week.   nitroGLYCERIN 0.4 MG SL tablet Commonly known as:  NITROSTAT Place 0.4 mg under the tongue every 5 (five)  minutes as needed (for Esophageal spasms).   pantoprazole 40 MG tablet Commonly known as:  PROTONIX Take 1 tablet (40 mg total) by mouth daily.      Contact information for after-discharge care    Destination    [facility] .   Service:  Skilled Nursing Contact information: [address] [address] [phone]             Allergies  Allergen Reactions  . Codeine Other (See Comments)    Patient was "all over the place"- undesired side effect  . Sulfasalazine Other (See Comments)    Reaction not recalled  . Sulfonamide Derivatives Other (See Comments)    Reaction not recalled    Consultations:  General Surgery   Procedures/Studies: Mr [name] NG Contrast  Result Date: 12/31/2018 CLINICAL DATA:  Initial  evaluation for acute intermittent dizziness for 3 days. EXAM: MRI HEAD WITHOUT CONTRAST MRA HEAD WITHOUT CONTRAST MRA NECK WITHOUT CONTRAST TECHNIQUE: Multiplanar, multiecho pulse sequences of the brain and surrounding structures were obtained without intravenous contrast. Angiographic images of the Circle of Willis were obtained using MRA technique without intravenous contrast. Angiographic images of the neck were obtained using MRA technique without intravenous contrast. Carotid stenosis measurements (when applicable) are obtained utilizing NASCET criteria, using the distal internal carotid diameter as the denominator. COMPARISON:  None. FINDINGS: MRI HEAD FINDINGS Brain: Advanced age-related cerebral atrophy. Minimal T2/FLAIR hyperintensity within the periventricular white matter, most likely related to chronic microvascular ischemic disease, felt to be within normal limits for age. 6 mm focus of diffusion abnormality at the left occipital lobe compatible with a small acute ischemic infarct (series 3, image 18). No associated hemorrhage or mass effect. No other evidence for acute or subacute ischemia. Gray-white matter  differentiation otherwise maintained. No encephalomalacia to suggest chronic cortical infarction. No foci of susceptibility artifact to suggest acute or chronic intracranial hemorrhage. No mass lesion, midline shift or mass effect. Diffuse ventricular prominence related to global parenchymal volume loss without hydrocephalus. No extra-axial fluid collection. Pituitary gland normal. Vascular: Major intracranial vascular flow voids maintained. Skull and upper cervical spine: Prominent degenerative thickening at the tectorial membrane with resultant severe stenosis at the craniocervical junction and flattening of the right greater than left upper cervical spinal cord (series 13, image 6). No visible cord signal changes on this exam. Remainder the visualized cervical spine demonstrates no acute finding. Bone marrow signal intensity within normal limits. No scalp soft tissue abnormality. Sinuses/Orbits: Patient status post bilateral ocular lens replacement. Mild scattered mucoperiosteal thickening throughout the paranasal sinuses without evidence for acute sinusitis. No mastoid effusion. Inner ear structures normal. Other: None. MRA HEAD FINDINGS ANTERIOR CIRCULATION: Distal cervical segments of the internal carotid arteries are patent with symmetric antegrade flow. Petrous segments widely patent bilaterally. Cavernous segments widely patent bilaterally. Moderate narrowing at the supraclinoid segments bilaterally, right slightly greater than left. ICA termini perfused. A1 segment patent. Right A1 not visualized, which could be either hypoplastic and/or absent. Grossly normal anterior communicating artery. Anterior cerebral arteries mildly irregular but patent to their distal aspects without significant stenosis. Mild atheromatous irregularity within the right M1 segment without high-grade stenosis. Normal right MCA bifurcation. Distal right MCA branches well perfused. On the left, there is a severe near occlusive  stenosis involving the proximal-mid left M1 segment, measuring approximately 7 mm in length (series 404, image 11). Left M1 narrowed but patent distally. Severe stenosis at the proximal of the left M2 inferior division. Left MCA branches otherwise perfused distally. POSTERIOR CIRCULATION: Left vertebral artery mildly irregular but patent to the vertebrobasilar junction without high-grade stenosis. Patent left PICA. Right vertebral artery occluded at the skull base. Some retrograde filling into the distal right V4 segment across the vertebrobasilar junction. Right PICA not visualized. Moderate to severe segmental stenoses involving the proximal and distal basilar artery (series 406, image 11). Superior cerebral arteries grossly patent bilaterally. Both of the PCAs primarily supplied via the basilar. Additional multifocal severe segmental stenoses involving the proximal and mid P2 segments seen bilaterally. PCAs are perfused to their distal aspects. MRA NECK FINDINGS Examination technically limited by lack of IV contrast. Partially visualized aortic arch within normal limits for caliber with normal 3 vessel morphology. No appreciable flow-limiting stenosis about the origin of the great vessels. Partially visualized subclavian arteries patent bilaterally. Right CCA patent from its  origin to the bifurcation without obvious flow-limiting stenosis. Atheromatous plaque about the right bifurcation with associated stenosis of approximately 50-60% by NASCET criteria. Right ICA otherwise patent distally. Left CCA tortuous proximally but grossly patent to the bifurcation without appreciable flow-limiting stenosis. No significant atheromatous narrowing about the left bifurcation. Left ICA mildly tortuous but otherwise patent. Both of the vertebral arteries arise from the subclavian arteries. Left vertebral artery dominant and widely patent within the neck. Right vertebral artery diminutive with scant irregular flow within the  right V2 segment. Right vertebral otherwise occluded within the neck. IMPRESSION: MRI HEAD IMPRESSION: 1. 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 2. Pronounced degenerative thickening at the tectorial membrane with resultant severe spinal stenosis at the cranial cervical junction and impingement upon the upper cervical spinal cord. No definite cord signal changes evident on this exam. 3. Underlying age-related cerebral atrophy. No other acute intracranial abnormality identified. MRA HEAD IMPRESSION: 1. 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 Dominant left vertebral artery widely patent to the vertebrobasilar junction. 2. 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 3. Severe near occlusive stenosis involving the proximal-mid left M1 segment, with additional downstream proximal left M2 stenosis. 4. Moderate diffuse atheromatous narrowing involving the supraclinoid ICAs bilaterally. MRA NECK IMPRESSION: 1. 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 Dominant left vertebral artery widely patent with antegrade flow. 2. 50-60% atheromatous stenosis at the right carotid bifurcation/origin of the right ICA. Right carotid artery system otherwise patent. 3. No hemodynamically significant stenosis within the left carotid artery system within the neck. Electronically Signed   By: Jeannine Boga M.D.   On: 12/31/2018 18:39   Mr Jodene Nam Neck Wo Contrast  Result Date: 12/31/2018 CLINICAL DATA:  Initial evaluation for acute intermittent dizziness for 3 days. EXAM: MRI HEAD WITHOUT CONTRAST MRA HEAD WITHOUT CONTRAST MRA NECK WITHOUT CONTRAST TECHNIQUE: Multiplanar, multiecho pulse sequences of the brain and surrounding structures were obtained without intravenous contrast. Angiographic images of the Circle of Willis were obtained using MRA technique without  intravenous contrast. Angiographic images of the neck were obtained using MRA technique without intravenous contrast. Carotid stenosis measurements (when applicable) are obtained utilizing NASCET criteria, using the distal internal carotid diameter as the denominator. COMPARISON:  None. FINDINGS: MRI HEAD FINDINGS Brain: Advanced age-related cerebral atrophy. Minimal T2/FLAIR hyperintensity within the periventricular white matter, most likely related to chronic microvascular ischemic disease, felt to be within normal limits for age. 6 mm focus of diffusion abnormality at the left occipital lobe compatible with a small acute ischemic infarct (series 3, image 18). No associated hemorrhage or mass effect. No other evidence for acute or subacute ischemia. Gray-white matter differentiation otherwise maintained. No encephalomalacia to suggest chronic cortical infarction. No foci of susceptibility artifact to suggest acute or chronic intracranial hemorrhage. No mass lesion, midline shift or mass effect. Diffuse ventricular prominence related to global parenchymal volume loss without hydrocephalus. No extra-axial fluid collection. Pituitary gland normal. Vascular: Major intracranial vascular flow voids maintained. Skull and upper cervical spine: Prominent degenerative thickening at the tectorial membrane with resultant severe stenosis at the craniocervical junction and flattening of the right greater than left upper cervical spinal cord (series 13, image 6). No visible cord signal changes on this exam. Remainder the visualized cervical spine demonstrates no acute finding. Bone marrow signal intensity within normal limits. No scalp soft tissue abnormality. Sinuses/Orbits: Patient status post bilateral ocular  lens replacement. Mild scattered mucoperiosteal thickening throughout the paranasal sinuses without evidence for acute sinusitis. No mastoid effusion. Inner ear structures normal. Other: None. MRA HEAD FINDINGS ANTERIOR  CIRCULATION: Distal cervical segments of the internal carotid arteries are patent with symmetric antegrade flow. Petrous segments widely patent bilaterally. Cavernous segments widely patent bilaterally. Moderate narrowing at the supraclinoid segments bilaterally, right slightly greater than left. ICA termini perfused. A1 segment patent. Right A1 not visualized, which could be either hypoplastic and/or absent. Grossly normal anterior communicating artery. Anterior cerebral arteries mildly irregular but patent to their distal aspects without significant stenosis. Mild atheromatous irregularity within the right M1 segment without high-grade stenosis. Normal right MCA bifurcation. Distal right MCA branches well perfused. On the left, there is a severe near occlusive stenosis involving the proximal-mid left M1 segment, measuring approximately 7 mm in length (series 404, image 11). Left M1 narrowed but patent distally. Severe stenosis at the proximal of the left M2 inferior division. Left MCA branches otherwise perfused distally. POSTERIOR CIRCULATION: Left vertebral artery mildly irregular but patent to the vertebrobasilar junction without high-grade stenosis. Patent left PICA. Right vertebral artery occluded at the skull base. Some retrograde filling into the distal right V4 segment across the vertebrobasilar junction. Right PICA not visualized. Moderate to severe segmental stenoses involving the proximal and distal basilar artery (series 406, image 11). Superior cerebral arteries grossly patent bilaterally. Both of the PCAs primarily supplied via the basilar. Additional multifocal severe segmental stenoses involving the proximal and mid P2 segments seen bilaterally. PCAs are perfused to their distal aspects. MRA NECK FINDINGS Examination technically limited by lack of IV contrast. Partially visualized aortic arch within normal limits for caliber with normal 3 vessel morphology. No appreciable flow-limiting stenosis  about the origin of the great vessels. Partially visualized subclavian arteries patent bilaterally. Right CCA patent from its origin to the bifurcation without obvious flow-limiting stenosis. Atheromatous plaque about the right bifurcation with associated stenosis of approximately 50-60% by NASCET criteria. Right ICA otherwise patent distally. Left CCA tortuous proximally but grossly patent to the bifurcation without appreciable flow-limiting stenosis. No significant atheromatous narrowing about the left bifurcation. Left ICA mildly tortuous but otherwise patent. Both of the vertebral arteries arise from the subclavian arteries. Left vertebral artery dominant and widely patent within the neck. Right vertebral artery diminutive with scant irregular flow within the right V2 segment. Right vertebral otherwise occluded within the neck. IMPRESSION: MRI HEAD IMPRESSION: 1. 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 2. Pronounced degenerative thickening at the tectorial membrane with resultant severe spinal stenosis at the cranial cervical junction and impingement upon the upper cervical spinal cord. No definite cord signal changes evident on this exam. 3. Underlying age-related cerebral atrophy. No other acute intracranial abnormality identified. MRA HEAD IMPRESSION: 1. 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 Dominant left vertebral artery widely patent to the vertebrobasilar junction. 2. 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 3. Severe near occlusive stenosis involving the proximal-mid left M1 segment, with additional downstream proximal left M2 stenosis. 4. Moderate diffuse atheromatous narrowing involving the supraclinoid ICAs bilaterally. MRA NECK IMPRESSION: 1. 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 Dominant left vertebral artery widely  patent with antegrade flow. 2. 50-60% atheromatous stenosis at the right carotid bifurcation/origin of the right ICA. Right carotid artery system otherwise patent. 3. No hemodynamically significant stenosis within the left carotid artery system within  the neck. Electronically Signed   By: Jeannine Boga M.D.   On: 12/31/2018 18:39   Mr Brain Wo Contrast  Result Date: 12/31/2018 CLINICAL DATA:  Initial evaluation for acute intermittent dizziness for 3 days. EXAM: MRI HEAD WITHOUT CONTRAST MRA HEAD WITHOUT CONTRAST MRA NECK WITHOUT CONTRAST TECHNIQUE: Multiplanar, multiecho pulse sequences of the brain and surrounding structures were obtained without intravenous contrast. Angiographic images of the Circle of Willis were obtained using MRA technique without intravenous contrast. Angiographic images of the neck were obtained using MRA technique without intravenous contrast. Carotid stenosis measurements (when applicable) are obtained utilizing NASCET criteria, using the distal internal carotid diameter as the denominator. COMPARISON:  None. FINDINGS: MRI HEAD FINDINGS Brain: Advanced age-related cerebral atrophy. Minimal T2/FLAIR hyperintensity within the periventricular white matter, most likely related to chronic microvascular ischemic disease, felt to be within normal limits for age. 6 mm focus of diffusion abnormality at the left occipital lobe compatible with a small acute ischemic infarct (series 3, image 18). No associated hemorrhage or mass effect. No other evidence for acute or subacute ischemia. Gray-white matter differentiation otherwise maintained. No encephalomalacia to suggest chronic cortical infarction. No foci of susceptibility artifact to suggest acute or chronic intracranial hemorrhage. No mass lesion, midline shift or mass effect. Diffuse ventricular prominence related to global parenchymal volume loss without hydrocephalus. No extra-axial fluid collection. Pituitary gland normal. Vascular:  Major intracranial vascular flow voids maintained. Skull and upper cervical spine: Prominent degenerative thickening at the tectorial membrane with resultant severe stenosis at the craniocervical junction and flattening of the right greater than left upper cervical spinal cord (series 13, image 6). No visible cord signal changes on this exam. Remainder the visualized cervical spine demonstrates no acute finding. Bone marrow signal intensity within normal limits. No scalp soft tissue abnormality. Sinuses/Orbits: Patient status post bilateral ocular lens replacement. Mild scattered mucoperiosteal thickening throughout the paranasal sinuses without evidence for acute sinusitis. No mastoid effusion. Inner ear structures normal. Other: None. MRA HEAD FINDINGS ANTERIOR CIRCULATION: Distal cervical segments of the internal carotid arteries are patent with symmetric antegrade flow. Petrous segments widely patent bilaterally. Cavernous segments widely patent bilaterally. Moderate narrowing at the supraclinoid segments bilaterally, right slightly greater than left. ICA termini perfused. A1 segment patent. Right A1 not visualized, which could be either hypoplastic and/or absent. Grossly normal anterior communicating artery. Anterior cerebral arteries mildly irregular but patent to their distal aspects without significant stenosis. Mild atheromatous irregularity within the right M1 segment without high-grade stenosis. Normal right MCA bifurcation. Distal right MCA branches well perfused. On the left, there is a severe near occlusive stenosis involving the proximal-mid left M1 segment, measuring approximately 7 mm in length (series 404, image 11). Left M1 narrowed but patent distally. Severe stenosis at the proximal of the left M2 inferior division. Left MCA branches otherwise perfused distally. POSTERIOR CIRCULATION: Left vertebral artery mildly irregular but patent to the vertebrobasilar junction without high-grade stenosis.  Patent left PICA. Right vertebral artery occluded at the skull base. Some retrograde filling into the distal right V4 segment across the vertebrobasilar junction. Right PICA not visualized. Moderate to severe segmental stenoses involving the proximal and distal basilar artery (series 406, image 11). Superior cerebral arteries grossly patent bilaterally. Both of the PCAs primarily supplied via the basilar. Additional multifocal severe segmental stenoses involving the proximal and mid P2 segments seen bilaterally. PCAs are perfused to their distal aspects. MRA NECK FINDINGS Examination technically limited by lack of IV contrast. Partially visualized aortic arch within normal  limits for caliber with normal 3 vessel morphology. No appreciable flow-limiting stenosis about the origin of the great vessels. Partially visualized subclavian arteries patent bilaterally. Right CCA patent from its origin to the bifurcation without obvious flow-limiting stenosis. Atheromatous plaque about the right bifurcation with associated stenosis of approximately 50-60% by NASCET criteria. Right ICA otherwise patent distally. Left CCA tortuous proximally but grossly patent to the bifurcation without appreciable flow-limiting stenosis. No significant atheromatous narrowing about the left bifurcation. Left ICA mildly tortuous but otherwise patent. Both of the vertebral arteries arise from the subclavian arteries. Left vertebral artery dominant and widely patent within the neck. Right vertebral artery diminutive with scant irregular flow within the right V2 segment. Right vertebral otherwise occluded within the neck. IMPRESSION: MRI HEAD IMPRESSION: 1. 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 2. Pronounced degenerative thickening at the tectorial membrane with resultant severe spinal stenosis at the cranial cervical junction and impingement upon the upper cervical spinal cord. No definite cord signal changes  evident on this exam. 3. Underlying age-related cerebral atrophy. No other acute intracranial abnormality identified. MRA HEAD IMPRESSION: 1. 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 Dominant left vertebral artery widely patent to the vertebrobasilar junction. 2. 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 3. Severe near occlusive stenosis involving the proximal-mid left M1 segment, with additional downstream proximal left M2 stenosis. 4. Moderate diffuse atheromatous narrowing involving the supraclinoid ICAs bilaterally. MRA NECK IMPRESSION: 1. 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 Dominant left vertebral artery widely patent with antegrade flow. 2. 50-60% atheromatous stenosis at the right carotid bifurcation/origin of the right ICA. Right carotid artery system otherwise patent. 3. No hemodynamically significant stenosis within the left carotid artery system within the neck. Electronically Signed   By: Jeannine Boga M.D.   On: 12/31/2018 18:39   Nm Hepatobiliary Liver Func  Result Date: 01/13/2019 CLINICAL DATA:  Patient admitted 01/12/2019 with abdominal pain. EXAM: NUCLEAR MEDICINE HEPATOBILIARY IMAGING TECHNIQUE: Sequential images of the abdomen were obtained out to 60 minutes following intravenous administration of radiopharmaceutical. RADIOPHARMACEUTICALS:  5.41 mCi Tc-60m  Choletec IV COMPARISON:  None. FINDINGS: Prompt, homogeneous radiotracer uptake in the liver and flow of radiotracer into bowel. The gallbladder was not visualized 90 minutes. Therefore, 2.7 mg of morphine rib minister diabetes the patient. The patient was imaged for an additional 30 minutes. The gallbladder again was not visualized. IMPRESSION: Nonvisualization of the gallbladder including after administration of morphine consistent with occlusion of the cystic duct. Electronically Signed    By: Inge Rise M.D.   On: 01/13/2019 13:44   Ct Abdomen Pelvis W Contrast  Result Date: 01/12/2019 CLINICAL DATA:  Right upper quadrant pain EXAM: CT ABDOMEN AND PELVIS WITH CONTRAST TECHNIQUE: Multidetector CT imaging of the abdomen and pelvis was performed using the standard protocol following bolus administration of intravenous contrast. CONTRAST:  76mL OMNIPAQUE IOHEXOL 300 MG/ML  SOLN COMPARISON:  Ultrasound 05/14/2018 FINDINGS: Lower chest: Lung bases demonstrate no acute consolidation or effusion. Atelectasis at the left base. Heart size within normal limits. Large hiatal hernia. Distal esophageal thickening. Hepatobiliary: No focal liver abnormality is seen. No gallstones, gallbladder wall thickening, or biliary dilatation. Pancreas: Unremarkable. No pancreatic ductal dilatation or surrounding inflammatory changes. Spleen: Normal in size without focal abnormality. Adrenals/Urinary Tract: Adrenal glands are normal. No hydronephrosis. Cysts within the bilateral kidneys. Bladder is normal Stomach/Bowel: The stomach is nonenlarged. No  dilated small bowel. Descending and sigmoid colon diverticular disease without acute inflammatory change. Vascular/Lymphatic: Ectatic distal infrarenal abdominal aorta up to 2.9 cm. Moderate aortic atherosclerosis. No significantly enlarged lymph nodes. Reproductive: Post TURP changes of enlarged prostate. Other: Negative for free air or free fluid. Fat and small calcification in the umbilical region with tiny fluid. Musculoskeletal: Degenerative changes. No acute or suspicious osseous abnormality. IMPRESSION: 1. No CT evidence for acute intra-abdominal or pelvic abnormality. 2. Large hiatal hernia. Distal esophageal thickening, possible esophagitis or reflux 3. Extensive left colon diverticular disease without acute inflammatory change 4. Ectatic distal infrarenal abdominal aorta up to 2.9 cm. Ectatic abdominal aorta at risk for aneurysm development. Recommend followup  by ultrasound in 5 years. This recommendation follows ACR consensus guidelines: White Paper of the ACR Incidental Findings Committee II on Vascular Findings. J Am Coll Radiol 2013; 10:789-794. Electronically Signed   By: Donavan Foil M.D.   On: 01/12/2019 23:39   US Abdomen Limited  Result Date: 01/13/2019 CLINICAL DATA:  Epigastric pain EXAM: ULTRASOUND ABDOMEN LIMITED RIGHT UPPER QUADRANT COMPARISON:  CT 01/12/2019 FINDINGS: Gallbladder: No shadowing stones or sludge. Increased wall thickness at 4.1 mm. Negative sonographic Murphy. Common bile duct: Diameter: Poorly visualized, about 2.2 mm Liver: No focal lesion identified. Within normal limits in parenchymal echogenicity. Portal vein is patent on color Doppler imaging with normal direction of blood flow towards the liver. IMPRESSION: No shadowing stones. Slightly thickened gallbladder wall, nonspecific finding, could be secondary to acute or chronic cholecystitis, liver disease, or edema forming states. If acute gallbladder disease remains a concern, further evaluation with hepatobiliary nuclear medicine study could be obtained. Electronically Signed   By: Donavan Foil M.D.   On: 01/13/2019 01:43      Subjective: No complaints.  Feeling tired is all.  No fever, vomiting.  No abdominal pain, confusion, cough, sputum production.  Son notes wheezing, but without DOE, edema, orthopnea.  Discharge Exam: Vitals:   01/17/19 0621 01/17/19 1321  BP: 124/63 139/85  Pulse: 91 89  Resp: 18   Temp: 97.9 F (36.6 C)   SpO2: 92% (!) 88%   Vitals:   01/16/19 1528 01/16/19 2137 01/17/19 0621 01/17/19 1321  BP: 125/84 116/68 124/63 139/85  Pulse: 89 88 91 89  Resp: 18 16 18    Temp: (!) 97.5 F (36.4 C) 97.7 F (36.5 C) 97.9 F (36.6 C)   TempSrc: Oral Oral Oral   SpO2: 96% 97% 92% (!) 88%  Weight:   74.8 kg   Height:        General: Pt is alert, awake, not in acute distress, sitting in recliner, working with OT Cardiovascular: RRR, nl  S1-S2, no murmurs appreciated.   No LE edema.   Respiratory: Normal respiratory rate and rhythm.  CTAB without rales or wheezes. Abdominal: Abdomen soft and non-tender.  No distension or HSM.   Neuro/Psych: Strength symmetric in upper and lower extremities.  Judgment and insight appear normal.   The results of significant diagnostics from this hospitalization (including imaging, microbiology, ancillary and laboratory) are listed below for reference.     Microbiology: No results found for this or any previous visit (from the past 240 hour(s)).   Labs: BNP (last 3 results) No results for input(s): BNP in the last 8760 hours. Basic Metabolic Panel: Recent Labs  Lab 01/12/19 2205 01/13/19 0552 01/14/19 0941 01/15/19 0431 01/17/19 0327  NA 139 140 142 141 137  K 4.0 3.7 3.6 3.1* 3.8  CL 106 104 110  108 107  CO2 24 21* 24 20* 20*  GLUCOSE 155* 154* 145* 126* 115*  BUN 18 15 14 15 14   CREATININE 1.55* 1.40* 1.59* 1.57* 1.46*  CALCIUM 8.7* 8.6* 8.7* 8.4* 8.3*  MG  --   --   --  1.7  --    Liver Function Tests: Recent Labs  Lab 01/12/19 2205 01/13/19 0552 01/14/19 0941 01/15/19 0431  AST 17 19 25 19   ALT 14 15 19 16   ALKPHOS 78 77 66 58  BILITOT 0.6 0.6 1.0 1.1  PROT 6.2* 6.4* 6.0* 5.4*  ALBUMIN 3.4* 3.4* 3.1* 2.6*   Recent Labs  Lab 01/12/19 2205  LIPASE 42   No results for input(s): AMMONIA in the last 168 hours. CBC: Recent Labs  Lab 01/13/19 0552 01/14/19 0941 01/15/19 0431 01/16/19 0741 01/17/19 0327  WBC 11.5* 22.0* 17.9* 14.7* 11.4*  NEUTROABS 9.2*  --   --   --   --   HGB 13.5 13.9 12.4* 12.9* 12.2*  HCT 40.9 42.8 38.9* 40.1 37.0*  MCV 94.7 96.8 96.3 95.5 95.6  PLT 256 242 222 242 239   Cardiac Enzymes: Recent Labs  Lab 01/12/19 2205 01/13/19 0303  TROPONINI <0.03 <0.03   BNP: Invalid input(s): POCBNP CBG: Recent Labs  Lab 01/13/19 0810 01/14/19 0814 01/15/19 0753 01/17/19 0754  GLUCAP 139* 138* 118* 129*   D-Dimer No results for  input(s): DDIMER in the last 72 hours. Hgb A1c No results for input(s): HGBA1C in the last 72 hours. Lipid Profile No results for input(s): CHOL, HDL, LDLCALC, TRIG, CHOLHDL, LDLDIRECT in the last 72 hours. Thyroid function studies No results for input(s): TSH, T4TOTAL, T3FREE, THYROIDAB in the last 72 hours.  Invalid input(s): FREET3 Anemia work up No results for input(s): VITAMINB12, FOLATE, FERRITIN, TIBC, IRON, RETICCTPCT in the last 72 hours. Urinalysis    Component Value Date/Time   COLORURINE YELLOW 01/13/2019 0027   APPEARANCEUR CLEAR 01/13/2019 0027   LABSPEC 1.025 01/13/2019 0027   PHURINE 6.0 01/13/2019 0027   GLUCOSEU NEGATIVE 01/13/2019 0027   HGBUR NEGATIVE 01/13/2019 0027   BILIRUBINUR NEGATIVE 01/13/2019 0027   KETONESUR NEGATIVE 01/13/2019 0027   PROTEINUR NEGATIVE 01/13/2019 0027   UROBILINOGEN 0.2 08/27/2015 1930   NITRITE NEGATIVE 01/13/2019 0027   LEUKOCYTESUR NEGATIVE 01/13/2019 0027   Sepsis Labs Invalid input(s): PROCALCITONIN,  WBC,  LACTICIDVEN Microbiology No results found for this or any previous visit (from the past 240 hour(s)).   Time coordinating discharge: 45 minutes       SIGNED:   Edwin Dada, MD  [facility] 01/17/2019, 1:49 PM

## 2019-01-17 NOTE — Social Work (Signed)
Clinical Social Worker facilitated patient discharge including contacting patient family and facility to confirm patient discharge plans.  Clinical information faxed to facility and family agreeable with plan.  CSW arranged ambulance transport via personal car to [facility] and [place] to call [phone]  with report prior to discharge.  Clinical Social Worker will sign off for now as social work intervention is no longer needed. Please consult [place] again if new need arises.  Westley Hummer, MSW, [place] Social Worker [phone]

## 2019-01-17 NOTE — Evaluation (Signed)
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  Found to have acute cholecystitis. Discharged from [facility] 01/10/2019 where he was rehabbing from possible CVA and fall.    Clinical Impression   Pt PTA: living with elderly spouse and independent prior to CIR and hospital admission earlier this year. Pt currently limited by decreased strength, mobility and inability to care for self. Pt would benefit from continued OT skilled services for ADL, mobility and safety in SNF setting. Pt fatigues easily requiring seated rest break. OT to follow acutely.    Follow Up Recommendations  SNF;Supervision - Intermittent    Equipment Recommendations  None recommended by OT    Recommendations for Other Services       Precautions / Restrictions Precautions Precautions: Fall Precaution Comments: no dizziness today per pt Restrictions Weight Bearing Restrictions: No      Mobility Bed Mobility Overal bed mobility: Needs Assistance Bed Mobility: Supine to Sit     Supine to sit: Supervision     General bed mobility comments: Pt in chair on arrival.   Transfers Overall transfer level: Needs assistance Equipment used: Rolling walker (2 wheeled) Transfers: Sit to/from Stand Sit to Stand: Min guard Stand pivot transfers: Min guard       General transfer comment: min guard for safety and min vc for hand placement for powerup. Overall pt more steady today.     Balance Overall balance assessment: Needs assistance Sitting-balance support: Feet supported;No upper extremity  supported Sitting balance-Leahy Scale: Fair Sitting balance - Comments: pt able to sit EOB with min guard    Standing balance support: Bilateral upper extremity supported Standing balance-Leahy Scale: Fair Standing balance comment: requires UE support for balance                           ADL either performed or assessed with clinical judgement   ADL Overall ADL's : Needs assistance/impaired Eating/Feeding: Set up   Grooming: Set up   Upper Body Bathing: Set up   Lower Body Bathing: Minimal assistance;Sitting/lateral leans;Sit to/from stand   Upper Body Dressing : Set up;Sitting;Standing   Lower Body Dressing: Minimal assistance;Sitting/lateral leans;Sit to/from stand   Toilet Transfer: Designer, fashion/clothing and Hygiene: Minimal assistance;Sitting/lateral lean;Sit to/from stand       Functional mobility during ADLs: Min guard;Rolling walker General ADL Comments: Requires assist with all ADL and requires seated rest break or else pt is SOB requiring  >84mins to recover.     Vision Baseline Vision/History: Wears glasses Wears Glasses: Reading only Patient Visual Report: No change from baseline Vision Assessment?: No apparent visual deficits     Perception     Praxis      Pertinent Vitals/Pain Pain Assessment: No/denies pain     Hand Dominance Right   Extremity/Trunk Assessment Upper Extremity Assessment Upper Extremity Assessment: Generalized weakness   Lower Extremity Assessment Lower Extremity Assessment: Generalized weakness   Cervical / Trunk Assessment Cervical / Trunk Assessment: Kyphotic   Communication Communication Communication: HOH   Cognition Arousal/Alertness: Awake/alert Behavior During Therapy: WFL for tasks assessed/performed Overall Cognitive Status: History of  cognitive impairments - at baseline Area of Impairment: Memory                     Memory: Decreased short-term memory              General Comments  No further vestibular treatment necessary.     Exercises General Exercises - Lower Extremity Ankle Circles/Pumps: AROM;Both;10 reps;Seated Long Arc Quad: AROM;Strengthening;Both;Seated;20 reps Hip Flexion/Marching: AROM;Strengthening;Both;Seated;10 reps;Standing Mini-Sqauts: AROM;Both;5 reps;Standing Other Exercises Other Exercises: pt performed 5x sit<>stand with min guard and cueing for hand placement; progressing from 2 UE supports on arm rests of chair to hands in lap   Shoulder Instructions      Home Living Family/patient expects to be discharged to:: Skilled nursing facility Living Arrangements: Spouse/significant other Available Help at Discharge: Family;Available 24 hours/day Type of Home: Apartment Home Access: Stairs to enter Entrance Stairs-Number of Steps: 6 Entrance Stairs-Rails: Right;Left Home Layout: Two level;Bed/bath upstairs;1/2 bath on main level Alternate Level Stairs-Number of Steps: flight w/ chair lift   Bathroom Shower/Tub: Teacher, early years/pre: Standard Bathroom Accessibility: Yes   Home Equipment: Environmental consultant - 2 wheels      Lives With: Spouse    Prior Functioning/Environment Level of Independence: Needs assistance  Gait / Transfers Assistance Needed: occasional use of RW for ambulation  ADL's / Homemaking Assistance Needed: wife assists with some iADLs.             OT Problem List: Decreased strength;Decreased activity tolerance;Decreased range of motion;Impaired balance (sitting and/or standing);Decreased knowledge of use of DME or AE;Decreased knowledge of precautions;Decreased cognition      OT Treatment/Interventions: Self-care/ADL training;Therapeutic exercise;Energy conservation;DME and/or AE instruction;Therapeutic activities;Patient/family education    OT Goals(Current goals can be found in the care plan section) Acute Rehab OT Goals Patient Stated Goal: return home OT Goal Formulation: With  patient Time For Goal Achievement: 01/31/19 Potential to Achieve Goals: Good  OT Frequency: Min 2X/week   Barriers to D/C: Decreased caregiver support          Co-evaluation              AM-PAC OT "6 Clicks" Daily Activity     Outcome Measure Help from another person eating meals?: None Help from another person taking care of personal grooming?: A Little Help from another person toileting, which includes using toliet, bedpan, or urinal?: A Little Help from another person bathing (including washing, rinsing, drying)?: A Little Help from another person to put on and taking off regular upper body clothing?: A Little Help from another person to put on and taking off regular lower body clothing?: A Little 6 Click Score: 19   End of Session Equipment Utilized During Treatment: Rolling walker;Gait belt Nurse Communication: Mobility status  Activity Tolerance: Patient tolerated treatment well Patient left: in chair;with call bell/phone within reach;with chair alarm set  OT Visit Diagnosis: Unsteadiness on feet (R26.81);Other abnormalities of gait and mobility (R26.89);Muscle weakness (generalized) (M62.81);Other symptoms and signs involving cognitive function                Time: 5170-0174 OT Time Calculation (min): 36 min Charges:  OT General Charges $OT Visit: 1 Visit OT Evaluation $OT Eval Moderate Complexity: 1 Mod OT Treatments $Self Care/Home Management : 8-22 mins  Ebony Hail Harold Hedge) Marsa Aris OTR/L Acute Rehabilitation Services Pager: [phone] Office: [phone]   Fredda Hammed 01/17/2019, 2:47 PM

## 2019-01-17 NOTE — Progress Notes (Signed)
Report called to [place], Therapist, sports at [place].

## 2019-01-17 NOTE — Patient Instructions (Signed)
See assessment and plan under each diagnosis in the problem list and acutely for this visit 

## 2019-01-17 NOTE — Assessment & Plan Note (Signed)
Acute left occipital CVA Plavix and aspirin Neurology follow-up with Dr. Delice Lesch

## 2019-01-17 NOTE — Assessment & Plan Note (Signed)
Continue Augmentin at [facility]

## 2019-01-17 NOTE — Clinical Social Work Placement (Addendum)
   CLINICAL SOCIAL WORK PLACEMENT  NOTE [facility]  Date:  01/17/2019  Patient Details  Name: Robert Rowe MRN: [id number] Date of Birth: [birth year]  Clinical Social Work is seeking post-discharge placement for this patient at the [place] level of care (*CSW will initial, date and re-position this form in  chart as items are completed):  Yes   Patient/family provided with [facility] Work Department's list of facilities offering this level of care within the geographic area requested by the patient (or if unable, by the patient's family).  Yes   Patient/family informed of their freedom to choose among providers that offer the needed level of care, that participate in Medicare, Medicaid or managed care program needed by the patient, have an available bed and are willing to accept the patient.  Yes   Patient/family informed of [facility]'s ownership interest in [facility] and [facility], as well as of the fact that they are under no obligation to receive care at these facilities.  PASRR submitted to EDS on       PASRR number received on 01/17/19     Existing PASRR number confirmed on       FL2 transmitted to all facilities in geographic area requested by pt/family on 01/17/19     FL2 transmitted to all facilities within larger geographic area on       Patient informed that his/her managed care company has contracts with or will negotiate with certain facilities, including the following:        Yes   Patient/family informed of bed offers received.  Patient chooses bed at [place] recommends and patient chooses bed at      Patient to be transferred to [facility] on 01[facility]17/[facility].  Patient to be transferred to facility by personal car     Patient family notified on 01/17/19 of transfer.  Name of family member notified:  pt son [name]     PHYSICIAN Please prepare priority  discharge summary, including medications, Please prepare prescriptions     Additional Comment:    _______________________________________________ Alexander Mt, LCSWA 01/17/2019, 12:41 PM

## 2019-01-21 ENCOUNTER — Encounter: Payer: Self-pay | Admitting: Internal Medicine

## 2019-01-21 NOTE — Progress Notes (Signed)
Location:    [facility] Room Number: 950/D Place of Service:  SNF [phone]) Provider:  Ellwood Handler, MD  Patient Care Team: [name], MD as PCP - General (Family Medicine) [name] Nadean Corwin, MD as PCP - Cardiology (Cardiology)  Extended Emergency Contact Information Primary Emergency Contact: [facility] Address: [address]          [place], [place] [address] [place] Phone: [phone] Mobile Phone: [phone] Relation: Spouse Secondary Emergency Contact: Jahlon, Baines Mobile Phone: [phone] Relation: Son  Code Status:  Full Code Goals of care: Advanced Directive information Advanced Directives 01/21/2019  Does Patient Have a Medical Advance Directive? Yes  Type of Advance Directive (No Data)  Does patient want to make changes to medical advance directive? No - Patient declined  Copy of [place] in Chart? No - copy requested  Would patient like information on creating a medical advance directive? -  Pre-existing out of facility DNR order (yellow form or pink MOST form) -     Chief Complaint  Patient presents with  . Acute Visit    F/U Rash and Diarrhea    HPI:  Pt is a 85 y.o. male seen today for an acute visit for    Past Medical History:  Diagnosis Date  . Acute cholecystitis 12/2018  . BENIGN PROSTATIC HYPERTROPHY 04/05/2007  . 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 CHEST DISCOMFORT 04/25/2009  . Chronic diastolic CHF (congestive heart failure) ([place])    a. Echo 12/16: mild LVH, EF 55-60%, no RWMA, Gr 1 DD, mild AI, MAC, mild LAE, normal RVF, PASP 37 mmHg  . Chronic kidney disease    chronic   . DJD (degenerative joint disease)   . Dysrhythmia    pvc's   . GERD 12/03/2007  . Hiatal hernia   . History of nuclear stress test    Myoview 1/17: EF 53%, normal perfusion; Low Risk  . HYPERLIPIDEMIA 04/05/2007  .  Hypothyroidism   . Osteoarth NOS-Unspec 04/05/2007  . Palpitations 02/12/2010  . PEDAL EDEMA 05/01/2008  . Rheumatoid arthritis(714.0) 04/05/2007  . Shortness of breath dyspnea   . TRANSIENT ISCHEMIC ATTACK, HX OF 04/05/2007   ?    Past Surgical History:  Procedure Laterality Date  . BALLOON DILATION N/A 03/12/2013   Procedure: BALLOON DILATION;  Surgeon: Inda Castle, MD;  Location: [facility];  Service: Endoscopy;  Laterality: N/A;  . CARPAL TUNNEL RELEASE  11/28/2018   Left hand  . CATARACT EXTRACTION  2001   bilateral  . CYSTOSCOPY WITH URETHRAL DILATATION N/A 09/24/2015   Procedure: CYSTOSCOPY WITH COOK BALLOON DILATATION OF URETHRAL STRICTURE ;  Surgeon: Carolan Clines, MD;  Location: [facility];  Service: Urology;  Laterality: N/A;  . CYSTOSCOPY WITH URETHRAL DILATATION N/A 07/20/2016   Procedure: CYSTOSCOPY WITH URETHRAL DILATATION;  Surgeon: Alexis Frock, MD;  Location: [facility];  Service: Urology;  Laterality: N/A;  1 HOUR  . ESOPHAGOGASTRODUODENOSCOPY N/A 03/12/2013   Procedure: ESOPHAGOGASTRODUODENOSCOPY (EGD);  Surgeon: Inda Castle, MD;  Location: [facility];  Service: Endoscopy;  Laterality: N/A;  . HEMORRHOID SURGERY    . HERNIA REPAIR     inguinal x5  . KNEE SURGERY  arthroscopic   left  . PROSTATE CRYOABLATION     2000  . SHOULDER SURGERY  few years ago   left  . TRANSURETHRAL RESECTION OF PROSTATE N/A 03/19/2015  Procedure: TRANSURETHRAL RESECTION OF THE PROSTATE (TURP);  Surgeon: Carolan Clines, MD;  Location: [facility];  Service: Urology;  Laterality: N/A;    Allergies  Allergen Reactions  . Codeine Other (See Comments)    Patient was "all over the place"- undesired side effect  . Sulfasalazine Other (See Comments)    Reaction not recalled  . Sulfonamide Derivatives Other (See Comments)    Reaction not recalled    Outpatient Encounter Medications as of 01/21/2019  Medication Sig  . acetaminophen (TYLENOL) 325 MG tablet Take 2 tablets (650 mg total)  by mouth every 6 (six) hours as needed for mild pain (or Fever >/= 101).  Marland Kitchen albuterol (PROVENTIL HFA;VENTOLIN HFA) 108 (90 Base) MCG/ACT inhaler Inhale 2 puffs into the lungs every 4 (four) hours as needed for wheezing or shortness of breath. (Patient not taking: Reported on 12/31/2018)  . clopidogrel (PLAVIX) 75 MG tablet Take 1 tablet (75 mg total) by mouth daily.  . furosemide (LASIX) 20 MG tablet TAKE 1 TABLET BY MOUTH  DAILY AS NEEDED FOR FLUID  OR EDEMA. (Patient taking differently: Take 20-40 mg by mouth daily as needed (for fluid retention in the legs). )  . latanoprost (XALATAN) 0.005 % ophthalmic solution Place 1 drop into both eyes at bedtime.   . meclizine (ANTIVERT) 12.5 MG tablet Take 1 tablet (12.5 mg total) by mouth 3 (three) times daily as needed for dizziness.  . metoprolol succinate (TOPROL-XL) 25 MG 24 hr tablet Take 1 tablet (25 mg total) by mouth at bedtime.  . Multiple Vitamin (MULTIVITAMIN WITH MINERALS) TABS tablet Take 1 tablet by mouth 2 (two) times a week.   . nitroGLYCERIN (NITROSTAT) 0.4 MG SL tablet Place 0.4 mg under the tongue every 5 (five) minutes as needed (for Esophageal spasms).   . pantoprazole (PROTONIX) 40 MG tablet Take 1 tablet (40 mg total) by mouth daily.  . [DISCONTINUED] amoxicillin-clavulanate (AUGMENTIN) 875-125 MG tablet Take 1 tablet by mouth 2 (two) times daily for 7 days.  . [DISCONTINUED] aspirin EC 81 MG EC tablet Take 1 tablet (81 mg total) by mouth daily for 30 days.  . [DISCONTINUED] atorvastatin (LIPITOR) 80 MG tablet Take 1 tablet (80 mg total) by mouth daily at 6 PM for 30 days.  . [DISCONTINUED] levothyroxine (SYNTHROID, LEVOTHROID) 50 MCG tablet TAKE 1 TABLET BY MOUTH  DAILY (Patient taking differently: Take 50 mcg by mouth daily before breakfast. )   No facility-administered encounter medications on file as of 01/21/2019.     Review of Systems  Immunization History  Administered Date(s) Administered  . H1N1 11/04/2008  . Influenza  Split 09/22/2011  . Influenza, High Dose Seasonal PF 09/20/2017, 08/10/2018  . Influenza-Unspecified 08/21/2014  . Pneumococcal Conjugate-13 06/22/2015  . Pneumococcal Polysaccharide-23 12/30/2009  . Tdap 06/07/2016   Pertinent  Health Maintenance Due  Topic Date Due  . INFLUENZA VACCINE  Completed  . PNA vac Low Risk Adult  Completed   Fall Risk  05/21/2018 05/08/2018 11/17/2017 11/22/2016 08/16/2016  Falls in the past year? No No Yes Yes Yes  Number falls in past yr: - - _0 or more  Injury with Fall? - - Yes Yes Yes  Risk Factor Category  - - - High Fall Risk -  Risk for fall due to : - - - - -   Functional Status Survey:    Vitals:   01/21/19 1040  BP: 99/67  Pulse: 96  Resp: 19  Temp: (!) 97.2 F (36.2 C)  TempSrc: Oral   There is no height or weight on file to calculate BMI. Physical Exam  Labs reviewed: Recent Labs    01/01/19 0421  01/14/19 0941 01/15/19 0431 01/17/19 0327  NA 143   < > 142 141 137  K 4.0   < > 3.6 3.1* 3.8  CL 109   < > 110 108 107  CO2 25   < > 24 20* 20*  GLUCOSE 122*   < > 145* 126* 115*  BUN 16   < > _0 CREATININE 1.58*   < > 1.59* 1.57* 1.46*  CALCIUM 9.3   < > 8.7* 8.4* 8.3*  MG 2.0  --   --  1.7  --   PHOS 3.9  --   --   --   --    < > = values in this interval not displayed.   Recent Labs    01/13/19 0552 01/14/19 0941 01/15/19 0431  AST _1 ALT _2 ALKPHOS 77 66 58  BILITOT 0.6 1.0 1.1  PROT 6.4* 6.0* 5.4*  ALBUMIN 3.4* 3.1* 2.6*   Recent Labs    01/04/19 0516  01/13/19 0552  01/15/19 0431 01/16/19 0741 01/17/19 0327  WBC 5.9   < > 11.5*   < > 17.9* 14.7* 11.4*  NEUTROABS 3.3  --  9.2*  --   --   --   --   HGB 13.5   < > 13.5   < > 12.4* 12.9* 12.2*  HCT 42.5   < > 40.9   < > 38.9* 40.1 37.0*  MCV 97.0   < > 94.7   < > 96.3 95.5 95.6  PLT 225   < > 256   < > 222 242 239   < > = values in this interval not displayed.   Lab Results  Component Value Date   TSH 2.124 12/31/2018   Lab  Results  Component Value Date   HGBA1C 6.2 (H) 01/01/2019   Lab Results  Component Value Date   CHOL 252 (H) 01/01/2019   HDL 34 (L) 01/01/2019   LDLCALC 177 (H) 01/01/2019   LDLDIRECT 175.0 03/24/2016   TRIG 204 (H) 01/01/2019   CHOLHDL 7.4 01/01/2019    Significant Diagnostic Results in last 30 days:  Mr [name] GQ Contrast  Result Date: 12/31/2018 CLINICAL DATA:  Initial evaluation for acute intermittent dizziness for 3 days. EXAM: MRI HEAD WITHOUT CONTRAST MRA HEAD WITHOUT CONTRAST MRA NECK WITHOUT CONTRAST TECHNIQUE: Multiplanar, multiecho pulse sequences of the brain and surrounding structures were obtained without intravenous contrast. Angiographic images of the Circle of Willis were obtained using MRA technique without intravenous contrast. Angiographic images of the neck were obtained using MRA technique without intravenous contrast. Carotid stenosis measurements (when applicable) are obtained utilizing NASCET criteria, using the distal internal carotid diameter as the denominator. COMPARISON:  None. FINDINGS: MRI HEAD FINDINGS Brain: Advanced age-related cerebral atrophy. Minimal T2/FLAIR hyperintensity within the periventricular white matter, most likely related to chronic microvascular ischemic disease, felt to be within normal limits for age. 6 mm focus of diffusion abnormality at the left occipital lobe compatible with a small acute ischemic infarct (series 3, image 18). No associated hemorrhage or mass effect. No other evidence for acute or subacute ischemia. Gray-white matter differentiation otherwise maintained. No encephalomalacia to suggest chronic cortical infarction. No foci of susceptibility artifact to suggest acute or chronic intracranial hemorrhage. No mass lesion, midline shift or  mass effect. Diffuse ventricular prominence related to global parenchymal volume loss without hydrocephalus. No extra-axial fluid collection. Pituitary gland normal. Vascular: Major intracranial  vascular flow voids maintained. Skull and upper cervical spine: Prominent degenerative thickening at the tectorial membrane with resultant severe stenosis at the craniocervical junction and flattening of the right greater than left upper cervical spinal cord (series 13, image 6). No visible cord signal changes on this exam. Remainder the visualized cervical spine demonstrates no acute finding. Bone marrow signal intensity within normal limits. No scalp soft tissue abnormality. Sinuses/Orbits: Patient status post bilateral ocular lens replacement. Mild scattered mucoperiosteal thickening throughout the paranasal sinuses without evidence for acute sinusitis. No mastoid effusion. Inner ear structures normal. Other: None. MRA HEAD FINDINGS ANTERIOR CIRCULATION: Distal cervical segments of the internal carotid arteries are patent with symmetric antegrade flow. Petrous segments widely patent bilaterally. Cavernous segments widely patent bilaterally. Moderate narrowing at the supraclinoid segments bilaterally, right slightly greater than left. ICA termini perfused. A1 segment patent. Right A1 not visualized, which could be either hypoplastic and/or absent. Grossly normal anterior communicating artery. Anterior cerebral arteries mildly irregular but patent to their distal aspects without significant stenosis. Mild atheromatous irregularity within the right M1 segment without high-grade stenosis. Normal right MCA bifurcation. Distal right MCA branches well perfused. On the left, there is a severe near occlusive stenosis involving the proximal-mid left M1 segment, measuring approximately 7 mm in length (series 404, image 11). Left M1 narrowed but patent distally. Severe stenosis at the proximal of the left M2 inferior division. Left MCA branches otherwise perfused distally. POSTERIOR CIRCULATION: Left vertebral artery mildly irregular but patent to the vertebrobasilar junction without high-grade stenosis. Patent left PICA.  Right vertebral artery occluded at the skull base. Some retrograde filling into the distal right V4 segment across the vertebrobasilar junction. Right PICA not visualized. Moderate to severe segmental stenoses involving the proximal and distal basilar artery (series 406, image 11). Superior cerebral arteries grossly patent bilaterally. Both of the PCAs primarily supplied via the basilar. Additional multifocal severe segmental stenoses involving the proximal and mid P2 segments seen bilaterally. PCAs are perfused to their distal aspects. MRA NECK FINDINGS Examination technically limited by lack of IV contrast. Partially visualized aortic arch within normal limits for caliber with normal 3 vessel morphology. No appreciable flow-limiting stenosis about the origin of the great vessels. Partially visualized subclavian arteries patent bilaterally. Right CCA patent from its origin to the bifurcation without obvious flow-limiting stenosis. Atheromatous plaque about the right bifurcation with associated stenosis of approximately 50-60% by NASCET criteria. Right ICA otherwise patent distally. Left CCA tortuous proximally but grossly patent to the bifurcation without appreciable flow-limiting stenosis. No significant atheromatous narrowing about the left bifurcation. Left ICA mildly tortuous but otherwise patent. Both of the vertebral arteries arise from the subclavian arteries. Left vertebral artery dominant and widely patent within the neck. Right vertebral artery diminutive with scant irregular flow within the right V2 segment. Right vertebral otherwise occluded within the neck. IMPRESSION: MRI HEAD IMPRESSION: 1. 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 2. Pronounced degenerative thickening at the tectorial membrane with resultant severe spinal stenosis at the cranial cervical junction and impingement upon the upper cervical spinal cord. No definite cord signal changes evident on this  exam. 3. Underlying age-related cerebral atrophy. No other acute intracranial abnormality identified. MRA HEAD IMPRESSION: 1. 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 Dominant left vertebral artery widely patent to the vertebrobasilar  junction. 2. 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 3. Severe near occlusive stenosis involving the proximal-mid left M1 segment, with additional downstream proximal left M2 stenosis. 4. Moderate diffuse atheromatous narrowing involving the supraclinoid ICAs bilaterally. MRA NECK IMPRESSION: 1. 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 Dominant left vertebral artery widely patent with antegrade flow. 2. 50-60% atheromatous stenosis at the right carotid bifurcation/origin of the right ICA. Right carotid artery system otherwise patent. 3. No hemodynamically significant stenosis within the left carotid artery system within the neck. Electronically Signed   By: Jeannine Boga M.D.   On: 12/31/2018 18:39   Mr Jodene Nam Neck Wo Contrast  Result Date: 12/31/2018 CLINICAL DATA:  Initial evaluation for acute intermittent dizziness for 3 days. EXAM: MRI HEAD WITHOUT CONTRAST MRA HEAD WITHOUT CONTRAST MRA NECK WITHOUT CONTRAST TECHNIQUE: Multiplanar, multiecho pulse sequences of the brain and surrounding structures were obtained without intravenous contrast. Angiographic images of the Circle of Willis were obtained using MRA technique without intravenous contrast. Angiographic images of the neck were obtained using MRA technique without intravenous contrast. Carotid stenosis measurements (when applicable) are obtained utilizing NASCET criteria, using the distal internal carotid diameter as the denominator. COMPARISON:  None. FINDINGS: MRI HEAD FINDINGS Brain: Advanced age-related cerebral atrophy. Minimal T2/FLAIR hyperintensity within the periventricular  white matter, most likely related to chronic microvascular ischemic disease, felt to be within normal limits for age. 6 mm focus of diffusion abnormality at the left occipital lobe compatible with a small acute ischemic infarct (series 3, image 18). No associated hemorrhage or mass effect. No other evidence for acute or subacute ischemia. Gray-white matter differentiation otherwise maintained. No encephalomalacia to suggest chronic cortical infarction. No foci of susceptibility artifact to suggest acute or chronic intracranial hemorrhage. No mass lesion, midline shift or mass effect. Diffuse ventricular prominence related to global parenchymal volume loss without hydrocephalus. No extra-axial fluid collection. Pituitary gland normal. Vascular: Major intracranial vascular flow voids maintained. Skull and upper cervical spine: Prominent degenerative thickening at the tectorial membrane with resultant severe stenosis at the craniocervical junction and flattening of the right greater than left upper cervical spinal cord (series 13, image 6). No visible cord signal changes on this exam. Remainder the visualized cervical spine demonstrates no acute finding. Bone marrow signal intensity within normal limits. No scalp soft tissue abnormality. Sinuses/Orbits: Patient status post bilateral ocular lens replacement. Mild scattered mucoperiosteal thickening throughout the paranasal sinuses without evidence for acute sinusitis. No mastoid effusion. Inner ear structures normal. Other: None. MRA HEAD FINDINGS ANTERIOR CIRCULATION: Distal cervical segments of the internal carotid arteries are patent with symmetric antegrade flow. Petrous segments widely patent bilaterally. Cavernous segments widely patent bilaterally. Moderate narrowing at the supraclinoid segments bilaterally, right slightly greater than left. ICA termini perfused. A1 segment patent. Right A1 not visualized, which could be either hypoplastic and/or absent. Grossly  normal anterior communicating artery. Anterior cerebral arteries mildly irregular but patent to their distal aspects without significant stenosis. Mild atheromatous irregularity within the right M1 segment without high-grade stenosis. Normal right MCA bifurcation. Distal right MCA branches well perfused. On the left, there is a severe near occlusive stenosis involving the proximal-mid left M1 segment, measuring approximately 7 mm in length (series 404, image 11). Left M1 narrowed but patent distally. Severe stenosis at the proximal of the left M2 inferior division. Left MCA branches otherwise perfused distally. POSTERIOR CIRCULATION: Left vertebral artery mildly irregular but patent to the vertebrobasilar junction without  high-grade stenosis. Patent left PICA. Right vertebral artery occluded at the skull base. Some retrograde filling into the distal right V4 segment across the vertebrobasilar junction. Right PICA not visualized. Moderate to severe segmental stenoses involving the proximal and distal basilar artery (series 406, image 11). Superior cerebral arteries grossly patent bilaterally. Both of the PCAs primarily supplied via the basilar. Additional multifocal severe segmental stenoses involving the proximal and mid P2 segments seen bilaterally. PCAs are perfused to their distal aspects. MRA NECK FINDINGS Examination technically limited by lack of IV contrast. Partially visualized aortic arch within normal limits for caliber with normal 3 vessel morphology. No appreciable flow-limiting stenosis about the origin of the great vessels. Partially visualized subclavian arteries patent bilaterally. Right CCA patent from its origin to the bifurcation without obvious flow-limiting stenosis. Atheromatous plaque about the right bifurcation with associated stenosis of approximately 50-60% by NASCET criteria. Right ICA otherwise patent distally. Left CCA tortuous proximally but grossly patent to the bifurcation without  appreciable flow-limiting stenosis. No significant atheromatous narrowing about the left bifurcation. Left ICA mildly tortuous but otherwise patent. Both of the vertebral arteries arise from the subclavian arteries. Left vertebral artery dominant and widely patent within the neck. Right vertebral artery diminutive with scant irregular flow within the right V2 segment. Right vertebral otherwise occluded within the neck. IMPRESSION: MRI HEAD IMPRESSION: 1. 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 2. Pronounced degenerative thickening at the tectorial membrane with resultant severe spinal stenosis at the cranial cervical junction and impingement upon the upper cervical spinal cord. No definite cord signal changes evident on this exam. 3. Underlying age-related cerebral atrophy. No other acute intracranial abnormality identified. MRA HEAD IMPRESSION: 1. 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 Dominant left vertebral artery widely patent to the vertebrobasilar junction. 2. 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 3. Severe near occlusive stenosis involving the proximal-mid left M1 segment, with additional downstream proximal left M2 stenosis. 4. Moderate diffuse atheromatous narrowing involving the supraclinoid ICAs bilaterally. MRA NECK IMPRESSION: 1. 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 Dominant left vertebral artery widely patent with antegrade flow. 2. 50-60% atheromatous stenosis at the right carotid bifurcation/origin of the right ICA. Right carotid artery system otherwise patent. 3. No hemodynamically significant stenosis within the left carotid artery system within the neck. Electronically Signed   By: Jeannine Boga M.D.   On: 12/31/2018 18:39   Mr Brain Wo Contrast  Result Date: 12/31/2018 CLINICAL DATA:  Initial evaluation  for acute intermittent dizziness for 3 days. EXAM: MRI HEAD WITHOUT CONTRAST MRA HEAD WITHOUT CONTRAST MRA NECK WITHOUT CONTRAST TECHNIQUE: Multiplanar, multiecho pulse sequences of the brain and surrounding structures were obtained without intravenous contrast. Angiographic images of the Circle of Willis were obtained using MRA technique without intravenous contrast. Angiographic images of the neck were obtained using MRA technique without intravenous contrast. Carotid stenosis measurements (when applicable) are obtained utilizing NASCET criteria, using the distal internal carotid diameter as the denominator. COMPARISON:  None. FINDINGS: MRI HEAD FINDINGS Brain: Advanced age-related cerebral atrophy. Minimal T2/FLAIR hyperintensity within the periventricular white matter, most likely related to chronic microvascular ischemic disease, felt to be within normal limits for age. 6 mm focus of diffusion abnormality at the left occipital lobe compatible with a small acute ischemic infarct (series 3, image 18). No associated hemorrhage or mass effect. No other evidence for acute or subacute ischemia.  Gray-white matter differentiation otherwise maintained. No encephalomalacia to suggest chronic cortical infarction. No foci of susceptibility artifact to suggest acute or chronic intracranial hemorrhage. No mass lesion, midline shift or mass effect. Diffuse ventricular prominence related to global parenchymal volume loss without hydrocephalus. No extra-axial fluid collection. Pituitary gland normal. Vascular: Major intracranial vascular flow voids maintained. Skull and upper cervical spine: Prominent degenerative thickening at the tectorial membrane with resultant severe stenosis at the craniocervical junction and flattening of the right greater than left upper cervical spinal cord (series 13, image 6). No visible cord signal changes on this exam. Remainder the visualized cervical spine demonstrates no acute finding. Bone  marrow signal intensity within normal limits. No scalp soft tissue abnormality. Sinuses/Orbits: Patient status post bilateral ocular lens replacement. Mild scattered mucoperiosteal thickening throughout the paranasal sinuses without evidence for acute sinusitis. No mastoid effusion. Inner ear structures normal. Other: None. MRA HEAD FINDINGS ANTERIOR CIRCULATION: Distal cervical segments of the internal carotid arteries are patent with symmetric antegrade flow. Petrous segments widely patent bilaterally. Cavernous segments widely patent bilaterally. Moderate narrowing at the supraclinoid segments bilaterally, right slightly greater than left. ICA termini perfused. A1 segment patent. Right A1 not visualized, which could be either hypoplastic and/or absent. Grossly normal anterior communicating artery. Anterior cerebral arteries mildly irregular but patent to their distal aspects without significant stenosis. Mild atheromatous irregularity within the right M1 segment without high-grade stenosis. Normal right MCA bifurcation. Distal right MCA branches well perfused. On the left, there is a severe near occlusive stenosis involving the proximal-mid left M1 segment, measuring approximately 7 mm in length (series 404, image 11). Left M1 narrowed but patent distally. Severe stenosis at the proximal of the left M2 inferior division. Left MCA branches otherwise perfused distally. POSTERIOR CIRCULATION: Left vertebral artery mildly irregular but patent to the vertebrobasilar junction without high-grade stenosis. Patent left PICA. Right vertebral artery occluded at the skull base. Some retrograde filling into the distal right V4 segment across the vertebrobasilar junction. Right PICA not visualized. Moderate to severe segmental stenoses involving the proximal and distal basilar artery (series 406, image 11). Superior cerebral arteries grossly patent bilaterally. Both of the PCAs primarily supplied via the basilar. Additional  multifocal severe segmental stenoses involving the proximal and mid P2 segments seen bilaterally. PCAs are perfused to their distal aspects. MRA NECK FINDINGS Examination technically limited by lack of IV contrast. Partially visualized aortic arch within normal limits for caliber with normal 3 vessel morphology. No appreciable flow-limiting stenosis about the origin of the great vessels. Partially visualized subclavian arteries patent bilaterally. Right CCA patent from its origin to the bifurcation without obvious flow-limiting stenosis. Atheromatous plaque about the right bifurcation with associated stenosis of approximately 50-60% by NASCET criteria. Right ICA otherwise patent distally. Left CCA tortuous proximally but grossly patent to the bifurcation without appreciable flow-limiting stenosis. No significant atheromatous narrowing about the left bifurcation. Left ICA mildly tortuous but otherwise patent. Both of the vertebral arteries arise from the subclavian arteries. Left vertebral artery dominant and widely patent within the neck. Right vertebral artery diminutive with scant irregular flow within the right V2 segment. Right vertebral otherwise occluded within the neck. IMPRESSION: MRI HEAD IMPRESSION: 1. 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 2. Pronounced degenerative thickening at the tectorial membrane with resultant severe spinal stenosis at the cranial cervical junction and impingement upon the upper cervical spinal cord. No definite cord signal changes evident on this exam. 3. Underlying age-related cerebral atrophy. No other acute  intracranial abnormality identified. MRA HEAD IMPRESSION: 1. 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 Dominant left vertebral artery widely patent to the vertebrobasilar junction. 2. Multifocal moderate to severe segmental stenoses involving the basilar artery and bilateral P2 segments  as above. 3. Severe near occlusive stenosis involving the proximal-mid left M1 segment, with additional downstream proximal left M2 stenosis. 4. Moderate diffuse atheromatous narrowing involving the supraclinoid ICAs bilaterally. MRA NECK IMPRESSION: 1. 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 Dominant left vertebral artery widely patent with antegrade flow. 2. 50-60% atheromatous stenosis at the right carotid bifurcation/origin of the right ICA. Right carotid artery system otherwise patent. 3. No hemodynamically significant stenosis within the left carotid artery system within the neck. Electronically Signed   By: Jeannine Boga M.D.   On: 12/31/2018 18:39   Nm Hepatobiliary Liver Func  Result Date: 01/13/2019 CLINICAL DATA:  Patient admitted 01/12/2019 with abdominal pain. EXAM: NUCLEAR MEDICINE HEPATOBILIARY IMAGING TECHNIQUE: Sequential images of the abdomen were obtained out to 60 minutes following intravenous administration of radiopharmaceutical. RADIOPHARMACEUTICALS:  5.41 mCi Tc-23m Choletec IV COMPARISON:  None. FINDINGS: Prompt, homogeneous radiotracer uptake in the liver and flow of radiotracer into bowel. The gallbladder was not visualized 90 minutes. Therefore, 2.7 mg of morphine rib minister diabetes the patient. The patient was imaged for an additional 30 minutes. The gallbladder again was not visualized. IMPRESSION: Nonvisualization of the gallbladder including after administration of morphine consistent with occlusion of the cystic duct. Electronically Signed   By: TInge RiseM.D.   On: 01/13/2019 13:44   Ct Abdomen Pelvis W Contrast  Result Date: 01/12/2019 CLINICAL DATA:  Right upper quadrant pain EXAM: CT ABDOMEN AND PELVIS WITH CONTRAST TECHNIQUE: Multidetector CT imaging of the abdomen and pelvis was performed using the standard protocol following bolus administration of intravenous contrast. CONTRAST:  819mOMNIPAQUE IOHEXOL 300 MG/ML  SOLN COMPARISON:   Ultrasound 05/14/2018 FINDINGS: Lower chest: Lung bases demonstrate no acute consolidation or effusion. Atelectasis at the left base. Heart size within normal limits. Large hiatal hernia. Distal esophageal thickening. Hepatobiliary: No focal liver abnormality is seen. No gallstones, gallbladder wall thickening, or biliary dilatation. Pancreas: Unremarkable. No pancreatic ductal dilatation or surrounding inflammatory changes. Spleen: Normal in size without focal abnormality. Adrenals/Urinary Tract: Adrenal glands are normal. No hydronephrosis. Cysts within the bilateral kidneys. Bladder is normal Stomach/Bowel: The stomach is nonenlarged. No dilated small bowel. Descending and sigmoid colon diverticular disease without acute inflammatory change. Vascular/Lymphatic: Ectatic distal infrarenal abdominal aorta up to 2.9 cm. Moderate aortic atherosclerosis. No significantly enlarged lymph nodes. Reproductive: Post TURP changes of enlarged prostate. Other: Negative for free air or free fluid. Fat and small calcification in the umbilical region with tiny fluid. Musculoskeletal: Degenerative changes. No acute or suspicious osseous abnormality. IMPRESSION: 1. No CT evidence for acute intra-abdominal or pelvic abnormality. 2. Large hiatal hernia. Distal esophageal thickening, possible esophagitis or reflux 3. Extensive left colon diverticular disease without acute inflammatory change 4. Ectatic distal infrarenal abdominal aorta up to 2.9 cm. Ectatic abdominal aorta at risk for aneurysm development. Recommend followup by ultrasound in 5 years. This recommendation follows ACR consensus guidelines: White Paper of the ACR Incidental Findings Committee II on Vascular Findings. J Am Coll Radiol 2013; 10:789-794. Electronically Signed   By: KiDonavan Foil.D.   On: 01/12/2019 23:39   UsKoreabdomen Limited  Result Date: 01/13/2019 CLINICAL DATA:  Epigastric pain EXAM: ULTRASOUND ABDOMEN LIMITED RIGHT UPPER QUADRANT COMPARISON:  CT  01/12/2019 FINDINGS: Gallbladder: No shadowing stones or sludge. Increased wall thickness at 4.1 mm. Negative sonographic Murphy. Common bile duct: Diameter: Poorly visualized, about 2.2 mm Liver: No focal lesion identified. Within normal limits in parenchymal echogenicity. Portal vein is patent on color Doppler imaging with normal direction of blood flow towards the liver. IMPRESSION: No shadowing stones. Slightly thickened gallbladder wall, nonspecific finding, could be secondary to acute or chronic cholecystitis, liver disease, or edema forming states. If acute gallbladder disease remains a concern, further evaluation with hepatobiliary nuclear medicine study could be obtained. Electronically Signed   By: Donavan Foil M.D.   On: 01/13/2019 01:43    Assessment/Plan There are no diagnoses linked to this encounter.      Oralia Manis, [place] [phone]  This encounter was created in error - please disregard.

## 2019-01-22 ENCOUNTER — Encounter: Payer: Medicare Other | Admitting: Registered Nurse

## 2019-01-25 DIAGNOSIS — R52 Pain, unspecified: Secondary | ICD-10-CM | POA: Diagnosis not present

## 2019-01-25 DIAGNOSIS — Z8673 Personal history of transient ischemic attack (TIA), and cerebral infarction without residual deficits: Secondary | ICD-10-CM | POA: Diagnosis not present

## 2019-01-25 DIAGNOSIS — K810 Acute cholecystitis: Secondary | ICD-10-CM | POA: Diagnosis not present

## 2019-01-25 DIAGNOSIS — R062 Wheezing: Secondary | ICD-10-CM | POA: Diagnosis not present

## 2019-01-25 DIAGNOSIS — M6281 Muscle weakness (generalized): Secondary | ICD-10-CM | POA: Diagnosis not present

## 2019-01-29 DIAGNOSIS — M6281 Muscle weakness (generalized): Secondary | ICD-10-CM | POA: Diagnosis not present

## 2019-01-29 DIAGNOSIS — R52 Pain, unspecified: Secondary | ICD-10-CM | POA: Diagnosis not present

## 2019-01-29 DIAGNOSIS — M19032 Primary osteoarthritis, left wrist: Secondary | ICD-10-CM | POA: Diagnosis not present

## 2019-01-29 DIAGNOSIS — K810 Acute cholecystitis: Secondary | ICD-10-CM | POA: Diagnosis not present

## 2019-01-29 DIAGNOSIS — J4520 Mild intermittent asthma, uncomplicated: Secondary | ICD-10-CM | POA: Diagnosis not present

## 2019-01-29 DIAGNOSIS — M47814 Spondylosis without myelopathy or radiculopathy, thoracic region: Secondary | ICD-10-CM | POA: Diagnosis not present

## 2019-01-29 DIAGNOSIS — I69398 Other sequelae of cerebral infarction: Secondary | ICD-10-CM | POA: Diagnosis not present

## 2019-01-29 DIAGNOSIS — M47816 Spondylosis without myelopathy or radiculopathy, lumbar region: Secondary | ICD-10-CM | POA: Diagnosis not present

## 2019-01-30 DIAGNOSIS — M6281 Muscle weakness (generalized): Secondary | ICD-10-CM | POA: Diagnosis not present

## 2019-01-30 DIAGNOSIS — K810 Acute cholecystitis: Secondary | ICD-10-CM | POA: Diagnosis not present

## 2019-01-30 DIAGNOSIS — L539 Erythematous condition, unspecified: Secondary | ICD-10-CM | POA: Diagnosis not present

## 2019-01-30 DIAGNOSIS — R062 Wheezing: Secondary | ICD-10-CM | POA: Diagnosis not present

## 2019-01-30 DIAGNOSIS — R52 Pain, unspecified: Secondary | ICD-10-CM | POA: Diagnosis not present

## 2019-01-31 ENCOUNTER — Telehealth: Payer: Self-pay | Admitting: Family Medicine

## 2019-01-31 DIAGNOSIS — I130 Hypertensive heart and chronic kidney disease with heart failure and stage 1 through stage 4 chronic kidney disease, or unspecified chronic kidney disease: Secondary | ICD-10-CM | POA: Diagnosis not present

## 2019-01-31 DIAGNOSIS — M069 Rheumatoid arthritis, unspecified: Secondary | ICD-10-CM | POA: Diagnosis not present

## 2019-01-31 DIAGNOSIS — N183 Chronic kidney disease, stage 3 (moderate): Secondary | ICD-10-CM | POA: Diagnosis not present

## 2019-01-31 DIAGNOSIS — M6281 Muscle weakness (generalized): Secondary | ICD-10-CM | POA: Diagnosis not present

## 2019-01-31 DIAGNOSIS — Z87891 Personal history of nicotine dependence: Secondary | ICD-10-CM | POA: Diagnosis not present

## 2019-01-31 DIAGNOSIS — I77811 Abdominal aortic ectasia: Secondary | ICD-10-CM | POA: Diagnosis not present

## 2019-01-31 DIAGNOSIS — I69398 Other sequelae of cerebral infarction: Secondary | ICD-10-CM | POA: Diagnosis not present

## 2019-01-31 DIAGNOSIS — E785 Hyperlipidemia, unspecified: Secondary | ICD-10-CM | POA: Diagnosis not present

## 2019-01-31 DIAGNOSIS — Z7902 Long term (current) use of antithrombotics/antiplatelets: Secondary | ICD-10-CM | POA: Diagnosis not present

## 2019-01-31 DIAGNOSIS — H5462 Unqualified visual loss, left eye, normal vision right eye: Secondary | ICD-10-CM | POA: Diagnosis not present

## 2019-01-31 DIAGNOSIS — M1990 Unspecified osteoarthritis, unspecified site: Secondary | ICD-10-CM | POA: Diagnosis not present

## 2019-01-31 DIAGNOSIS — I69328 Other speech and language deficits following cerebral infarction: Secondary | ICD-10-CM | POA: Diagnosis not present

## 2019-01-31 DIAGNOSIS — Z7982 Long term (current) use of aspirin: Secondary | ICD-10-CM | POA: Diagnosis not present

## 2019-01-31 DIAGNOSIS — I5032 Chronic diastolic (congestive) heart failure: Secondary | ICD-10-CM | POA: Diagnosis not present

## 2019-01-31 DIAGNOSIS — K819 Cholecystitis, unspecified: Secondary | ICD-10-CM | POA: Diagnosis not present

## 2019-01-31 DIAGNOSIS — H814 Vertigo of central origin: Secondary | ICD-10-CM | POA: Diagnosis not present

## 2019-01-31 DIAGNOSIS — K219 Gastro-esophageal reflux disease without esophagitis: Secondary | ICD-10-CM | POA: Diagnosis not present

## 2019-01-31 NOTE — Telephone Encounter (Signed)
Robert Rowe dropped off [place] forms  Please call Robert Rowe to pick up forms [phone]  Disposition: Dr's folder

## 2019-02-01 ENCOUNTER — Telehealth: Payer: Self-pay | Admitting: Family Medicine

## 2019-02-01 ENCOUNTER — Telehealth: Payer: Self-pay

## 2019-02-01 DIAGNOSIS — N183 Chronic kidney disease, stage 3 (moderate): Secondary | ICD-10-CM | POA: Diagnosis not present

## 2019-02-01 DIAGNOSIS — Z87891 Personal history of nicotine dependence: Secondary | ICD-10-CM | POA: Diagnosis not present

## 2019-02-01 DIAGNOSIS — Z7902 Long term (current) use of antithrombotics/antiplatelets: Secondary | ICD-10-CM | POA: Diagnosis not present

## 2019-02-01 DIAGNOSIS — E785 Hyperlipidemia, unspecified: Secondary | ICD-10-CM | POA: Diagnosis not present

## 2019-02-01 DIAGNOSIS — I130 Hypertensive heart and chronic kidney disease with heart failure and stage 1 through stage 4 chronic kidney disease, or unspecified chronic kidney disease: Secondary | ICD-10-CM | POA: Diagnosis not present

## 2019-02-01 DIAGNOSIS — M069 Rheumatoid arthritis, unspecified: Secondary | ICD-10-CM | POA: Diagnosis not present

## 2019-02-01 DIAGNOSIS — I77811 Abdominal aortic ectasia: Secondary | ICD-10-CM | POA: Diagnosis not present

## 2019-02-01 DIAGNOSIS — M6281 Muscle weakness (generalized): Secondary | ICD-10-CM | POA: Diagnosis not present

## 2019-02-01 DIAGNOSIS — K819 Cholecystitis, unspecified: Secondary | ICD-10-CM | POA: Diagnosis not present

## 2019-02-01 DIAGNOSIS — Z7982 Long term (current) use of aspirin: Secondary | ICD-10-CM | POA: Diagnosis not present

## 2019-02-01 DIAGNOSIS — H814 Vertigo of central origin: Secondary | ICD-10-CM | POA: Diagnosis not present

## 2019-02-01 DIAGNOSIS — M1990 Unspecified osteoarthritis, unspecified site: Secondary | ICD-10-CM | POA: Diagnosis not present

## 2019-02-01 DIAGNOSIS — I5032 Chronic diastolic (congestive) heart failure: Secondary | ICD-10-CM | POA: Diagnosis not present

## 2019-02-01 DIAGNOSIS — I69328 Other speech and language deficits following cerebral infarction: Secondary | ICD-10-CM | POA: Diagnosis not present

## 2019-02-01 DIAGNOSIS — I69398 Other sequelae of cerebral infarction: Secondary | ICD-10-CM | POA: Diagnosis not present

## 2019-02-01 DIAGNOSIS — H5462 Unqualified visual loss, left eye, normal vision right eye: Secondary | ICD-10-CM | POA: Diagnosis not present

## 2019-02-01 DIAGNOSIS — K219 Gastro-esophageal reflux disease without esophagitis: Secondary | ICD-10-CM | POA: Diagnosis not present

## 2019-02-01 NOTE — Telephone Encounter (Signed)
ok 

## 2019-02-01 NOTE — Telephone Encounter (Signed)
Please advise 

## 2019-02-01 NOTE — Telephone Encounter (Signed)
Copied from [place] [id number]. Topic: General - Inquiry >> Feb 01, 2019  8:57 AM Alanda Slim E wrote: Reason for CRM: Pt just came home from [facility] rehab. Pt was found laying in a bathroom from a fall while he was there. Xrays were taken and nothing was broken but wife believes arthritis seems to be setting in down the middle of his back. Patient is in severe pain and wanted to see if Dr. Elease Hashimoto will call in something for his pain and discomfort. / please advise

## 2019-02-01 NOTE — Telephone Encounter (Signed)
Make sure he has tried OTC Tylenol first.  If pain severe enough for additional pain meds will need to be seen.  At 61, I do not feel comfortable sending in opioid without evaluating first.  May need some back x-rays.

## 2019-02-01 NOTE — Telephone Encounter (Signed)
Copied from [place] [phone]. Topic: Quick Communication - Home Health Verbal Orders >> Feb 01, 2019  3:18 PM Leward Quan A wrote: Caller/Agency: Gerald Stabs [place] Number: [phone] [place] [facility] Requesting OT/PT/Skilled Nursing/Social Work/Speech Therapy: PT  Frequency: 1x wk 1 wk,  2xs wk 1 wk,  1x wk 2 wk   Requesting speech therapy evaluation

## 2019-02-01 NOTE — Telephone Encounter (Signed)
Called patient and spoke to his wife and she stated that he fell this past Saturday and I gave her the message to have him take Tylenol first and if no improvement over the weekend to call us Monday and schedule an appointment if she can bring him with her. Wife verbalized an understanding.

## 2019-02-01 NOTE — Telephone Encounter (Signed)
Please see message. °

## 2019-02-01 NOTE — Telephone Encounter (Signed)
Mickie Bail and gave him the verbal OK per Dr. Elease Hashimoto for orders. Erlene Quan verbalized an understanding.

## 2019-02-01 NOTE — Telephone Encounter (Signed)
Copied from [place] [phone]. Topic: Quick Communication - Home Health Verbal Orders >> Feb 01, 2019  8:53 AM Lennox Solders wrote: Caller/Agency: [facility] callback Number: [phone] Requesting OT 1x1, 2x3 , adls, transfers and energy conservation

## 2019-02-01 NOTE — Telephone Encounter (Signed)
Paperwork placed in red folder on your desk. Please return when complete. Thanks!

## 2019-02-03 NOTE — Telephone Encounter (Signed)
OK to approve requested services.

## 2019-02-05 DIAGNOSIS — E785 Hyperlipidemia, unspecified: Secondary | ICD-10-CM | POA: Diagnosis not present

## 2019-02-05 DIAGNOSIS — I69398 Other sequelae of cerebral infarction: Secondary | ICD-10-CM | POA: Diagnosis not present

## 2019-02-05 DIAGNOSIS — K819 Cholecystitis, unspecified: Secondary | ICD-10-CM | POA: Diagnosis not present

## 2019-02-05 DIAGNOSIS — I130 Hypertensive heart and chronic kidney disease with heart failure and stage 1 through stage 4 chronic kidney disease, or unspecified chronic kidney disease: Secondary | ICD-10-CM | POA: Diagnosis not present

## 2019-02-05 DIAGNOSIS — I77811 Abdominal aortic ectasia: Secondary | ICD-10-CM | POA: Diagnosis not present

## 2019-02-05 DIAGNOSIS — M6281 Muscle weakness (generalized): Secondary | ICD-10-CM | POA: Diagnosis not present

## 2019-02-05 DIAGNOSIS — Z7982 Long term (current) use of aspirin: Secondary | ICD-10-CM | POA: Diagnosis not present

## 2019-02-05 DIAGNOSIS — M069 Rheumatoid arthritis, unspecified: Secondary | ICD-10-CM | POA: Diagnosis not present

## 2019-02-05 DIAGNOSIS — I69328 Other speech and language deficits following cerebral infarction: Secondary | ICD-10-CM | POA: Diagnosis not present

## 2019-02-05 DIAGNOSIS — Z7902 Long term (current) use of antithrombotics/antiplatelets: Secondary | ICD-10-CM | POA: Diagnosis not present

## 2019-02-05 DIAGNOSIS — N183 Chronic kidney disease, stage 3 (moderate): Secondary | ICD-10-CM | POA: Diagnosis not present

## 2019-02-05 DIAGNOSIS — Z87891 Personal history of nicotine dependence: Secondary | ICD-10-CM | POA: Diagnosis not present

## 2019-02-05 DIAGNOSIS — H5462 Unqualified visual loss, left eye, normal vision right eye: Secondary | ICD-10-CM | POA: Diagnosis not present

## 2019-02-05 DIAGNOSIS — M1990 Unspecified osteoarthritis, unspecified site: Secondary | ICD-10-CM | POA: Diagnosis not present

## 2019-02-05 DIAGNOSIS — H814 Vertigo of central origin: Secondary | ICD-10-CM | POA: Diagnosis not present

## 2019-02-05 DIAGNOSIS — I5032 Chronic diastolic (congestive) heart failure: Secondary | ICD-10-CM | POA: Diagnosis not present

## 2019-02-05 DIAGNOSIS — K219 Gastro-esophageal reflux disease without esophagitis: Secondary | ICD-10-CM | POA: Diagnosis not present

## 2019-02-05 NOTE — Telephone Encounter (Signed)
Author phoned Gerald Stabs from [facility] and relayed approval of all services per Dr. Elease Hashimoto.

## 2019-02-05 NOTE — Telephone Encounter (Signed)
Patient's son, [name], calling to check status of paperwork. He stated it is fairly time sensitive and inquired if he could pick up from office asap. Please advise.

## 2019-02-05 NOTE — Telephone Encounter (Signed)
Patient is aware. Form has been placed up front for pick up.

## 2019-02-05 NOTE — Telephone Encounter (Signed)
I have completed to best of my ability.  He did not have hospital follow up.  There are some sections that need to be completed by family.

## 2019-02-06 ENCOUNTER — Ambulatory Visit: Payer: Medicare Other | Admitting: Family Medicine

## 2019-02-06 DIAGNOSIS — Z7982 Long term (current) use of aspirin: Secondary | ICD-10-CM | POA: Diagnosis not present

## 2019-02-06 DIAGNOSIS — I69328 Other speech and language deficits following cerebral infarction: Secondary | ICD-10-CM | POA: Diagnosis not present

## 2019-02-06 DIAGNOSIS — K219 Gastro-esophageal reflux disease without esophagitis: Secondary | ICD-10-CM | POA: Diagnosis not present

## 2019-02-06 DIAGNOSIS — I77811 Abdominal aortic ectasia: Secondary | ICD-10-CM | POA: Diagnosis not present

## 2019-02-06 DIAGNOSIS — I5032 Chronic diastolic (congestive) heart failure: Secondary | ICD-10-CM | POA: Diagnosis not present

## 2019-02-06 DIAGNOSIS — N183 Chronic kidney disease, stage 3 (moderate): Secondary | ICD-10-CM | POA: Diagnosis not present

## 2019-02-06 DIAGNOSIS — M069 Rheumatoid arthritis, unspecified: Secondary | ICD-10-CM | POA: Diagnosis not present

## 2019-02-06 DIAGNOSIS — M1990 Unspecified osteoarthritis, unspecified site: Secondary | ICD-10-CM | POA: Diagnosis not present

## 2019-02-06 DIAGNOSIS — H5462 Unqualified visual loss, left eye, normal vision right eye: Secondary | ICD-10-CM | POA: Diagnosis not present

## 2019-02-06 DIAGNOSIS — Z7902 Long term (current) use of antithrombotics/antiplatelets: Secondary | ICD-10-CM | POA: Diagnosis not present

## 2019-02-06 DIAGNOSIS — K819 Cholecystitis, unspecified: Secondary | ICD-10-CM | POA: Diagnosis not present

## 2019-02-06 DIAGNOSIS — I130 Hypertensive heart and chronic kidney disease with heart failure and stage 1 through stage 4 chronic kidney disease, or unspecified chronic kidney disease: Secondary | ICD-10-CM | POA: Diagnosis not present

## 2019-02-06 DIAGNOSIS — M6281 Muscle weakness (generalized): Secondary | ICD-10-CM | POA: Diagnosis not present

## 2019-02-06 DIAGNOSIS — E785 Hyperlipidemia, unspecified: Secondary | ICD-10-CM | POA: Diagnosis not present

## 2019-02-06 DIAGNOSIS — I69398 Other sequelae of cerebral infarction: Secondary | ICD-10-CM | POA: Diagnosis not present

## 2019-02-06 DIAGNOSIS — H814 Vertigo of central origin: Secondary | ICD-10-CM | POA: Diagnosis not present

## 2019-02-06 DIAGNOSIS — Z87891 Personal history of nicotine dependence: Secondary | ICD-10-CM | POA: Diagnosis not present

## 2019-02-07 ENCOUNTER — Telehealth: Payer: Self-pay | Admitting: *Deleted

## 2019-02-07 DIAGNOSIS — Z7982 Long term (current) use of aspirin: Secondary | ICD-10-CM | POA: Diagnosis not present

## 2019-02-07 DIAGNOSIS — I77811 Abdominal aortic ectasia: Secondary | ICD-10-CM | POA: Diagnosis not present

## 2019-02-07 DIAGNOSIS — M069 Rheumatoid arthritis, unspecified: Secondary | ICD-10-CM | POA: Diagnosis not present

## 2019-02-07 DIAGNOSIS — K819 Cholecystitis, unspecified: Secondary | ICD-10-CM | POA: Diagnosis not present

## 2019-02-07 DIAGNOSIS — I69328 Other speech and language deficits following cerebral infarction: Secondary | ICD-10-CM | POA: Diagnosis not present

## 2019-02-07 DIAGNOSIS — R0602 Shortness of breath: Secondary | ICD-10-CM

## 2019-02-07 DIAGNOSIS — H5462 Unqualified visual loss, left eye, normal vision right eye: Secondary | ICD-10-CM | POA: Diagnosis not present

## 2019-02-07 DIAGNOSIS — I69398 Other sequelae of cerebral infarction: Secondary | ICD-10-CM | POA: Diagnosis not present

## 2019-02-07 DIAGNOSIS — K219 Gastro-esophageal reflux disease without esophagitis: Secondary | ICD-10-CM | POA: Diagnosis not present

## 2019-02-07 DIAGNOSIS — H814 Vertigo of central origin: Secondary | ICD-10-CM | POA: Diagnosis not present

## 2019-02-07 DIAGNOSIS — E785 Hyperlipidemia, unspecified: Secondary | ICD-10-CM | POA: Diagnosis not present

## 2019-02-07 DIAGNOSIS — N183 Chronic kidney disease, stage 3 (moderate): Secondary | ICD-10-CM | POA: Diagnosis not present

## 2019-02-07 DIAGNOSIS — I130 Hypertensive heart and chronic kidney disease with heart failure and stage 1 through stage 4 chronic kidney disease, or unspecified chronic kidney disease: Secondary | ICD-10-CM | POA: Diagnosis not present

## 2019-02-07 DIAGNOSIS — M1990 Unspecified osteoarthritis, unspecified site: Secondary | ICD-10-CM | POA: Diagnosis not present

## 2019-02-07 DIAGNOSIS — Z7902 Long term (current) use of antithrombotics/antiplatelets: Secondary | ICD-10-CM | POA: Diagnosis not present

## 2019-02-07 DIAGNOSIS — Z87891 Personal history of nicotine dependence: Secondary | ICD-10-CM | POA: Diagnosis not present

## 2019-02-07 DIAGNOSIS — M6281 Muscle weakness (generalized): Secondary | ICD-10-CM | POA: Diagnosis not present

## 2019-02-07 DIAGNOSIS — I5032 Chronic diastolic (congestive) heart failure: Secondary | ICD-10-CM | POA: Diagnosis not present

## 2019-02-07 NOTE — Telephone Encounter (Signed)
Let's see if we can set up home health care nursing evaluation- to evaluate his edema and lung issue.

## 2019-02-07 NOTE — Telephone Encounter (Signed)
Referral placed.

## 2019-02-07 NOTE — Telephone Encounter (Signed)
Copied from [place] [phone]. Topic: General - Other >> Feb 07, 2019 10:53 AM Oneta Rack wrote: Caller name: Mallory speech pathologist from [facility]  Call back number: [phone]    Reason for call:  Patient was evaluated today, patient experiencing "wet cough, nasal drainage" patient was discharged from the hospital dx CHF as per speech pathologist patient denies he was dx with CHF. Patient may benefit from nursing care and wanted to know PCP thoughts. Patient experiencing edema in both legs and patient stated PCP is aware

## 2019-02-08 DIAGNOSIS — E785 Hyperlipidemia, unspecified: Secondary | ICD-10-CM | POA: Diagnosis not present

## 2019-02-08 DIAGNOSIS — M069 Rheumatoid arthritis, unspecified: Secondary | ICD-10-CM | POA: Diagnosis not present

## 2019-02-08 DIAGNOSIS — I77811 Abdominal aortic ectasia: Secondary | ICD-10-CM | POA: Diagnosis not present

## 2019-02-08 DIAGNOSIS — I130 Hypertensive heart and chronic kidney disease with heart failure and stage 1 through stage 4 chronic kidney disease, or unspecified chronic kidney disease: Secondary | ICD-10-CM | POA: Diagnosis not present

## 2019-02-08 DIAGNOSIS — H814 Vertigo of central origin: Secondary | ICD-10-CM | POA: Diagnosis not present

## 2019-02-08 DIAGNOSIS — Z7902 Long term (current) use of antithrombotics/antiplatelets: Secondary | ICD-10-CM | POA: Diagnosis not present

## 2019-02-08 DIAGNOSIS — I69398 Other sequelae of cerebral infarction: Secondary | ICD-10-CM | POA: Diagnosis not present

## 2019-02-08 DIAGNOSIS — Z7982 Long term (current) use of aspirin: Secondary | ICD-10-CM | POA: Diagnosis not present

## 2019-02-08 DIAGNOSIS — K819 Cholecystitis, unspecified: Secondary | ICD-10-CM | POA: Diagnosis not present

## 2019-02-08 DIAGNOSIS — N183 Chronic kidney disease, stage 3 (moderate): Secondary | ICD-10-CM | POA: Diagnosis not present

## 2019-02-08 DIAGNOSIS — I5032 Chronic diastolic (congestive) heart failure: Secondary | ICD-10-CM | POA: Diagnosis not present

## 2019-02-08 DIAGNOSIS — I69328 Other speech and language deficits following cerebral infarction: Secondary | ICD-10-CM | POA: Diagnosis not present

## 2019-02-08 DIAGNOSIS — K219 Gastro-esophageal reflux disease without esophagitis: Secondary | ICD-10-CM | POA: Diagnosis not present

## 2019-02-08 DIAGNOSIS — M6281 Muscle weakness (generalized): Secondary | ICD-10-CM | POA: Diagnosis not present

## 2019-02-08 DIAGNOSIS — H5462 Unqualified visual loss, left eye, normal vision right eye: Secondary | ICD-10-CM | POA: Diagnosis not present

## 2019-02-08 DIAGNOSIS — M1990 Unspecified osteoarthritis, unspecified site: Secondary | ICD-10-CM | POA: Diagnosis not present

## 2019-02-08 DIAGNOSIS — Z87891 Personal history of nicotine dependence: Secondary | ICD-10-CM | POA: Diagnosis not present

## 2019-02-09 DIAGNOSIS — I77811 Abdominal aortic ectasia: Secondary | ICD-10-CM | POA: Diagnosis not present

## 2019-02-09 DIAGNOSIS — Z87891 Personal history of nicotine dependence: Secondary | ICD-10-CM | POA: Diagnosis not present

## 2019-02-09 DIAGNOSIS — I5032 Chronic diastolic (congestive) heart failure: Secondary | ICD-10-CM | POA: Diagnosis not present

## 2019-02-09 DIAGNOSIS — I69398 Other sequelae of cerebral infarction: Secondary | ICD-10-CM | POA: Diagnosis not present

## 2019-02-09 DIAGNOSIS — Z7982 Long term (current) use of aspirin: Secondary | ICD-10-CM | POA: Diagnosis not present

## 2019-02-09 DIAGNOSIS — M069 Rheumatoid arthritis, unspecified: Secondary | ICD-10-CM | POA: Diagnosis not present

## 2019-02-09 DIAGNOSIS — N183 Chronic kidney disease, stage 3 (moderate): Secondary | ICD-10-CM | POA: Diagnosis not present

## 2019-02-09 DIAGNOSIS — K819 Cholecystitis, unspecified: Secondary | ICD-10-CM | POA: Diagnosis not present

## 2019-02-09 DIAGNOSIS — M6281 Muscle weakness (generalized): Secondary | ICD-10-CM | POA: Diagnosis not present

## 2019-02-09 DIAGNOSIS — K219 Gastro-esophageal reflux disease without esophagitis: Secondary | ICD-10-CM | POA: Diagnosis not present

## 2019-02-09 DIAGNOSIS — Z7902 Long term (current) use of antithrombotics/antiplatelets: Secondary | ICD-10-CM | POA: Diagnosis not present

## 2019-02-09 DIAGNOSIS — H814 Vertigo of central origin: Secondary | ICD-10-CM | POA: Diagnosis not present

## 2019-02-09 DIAGNOSIS — E785 Hyperlipidemia, unspecified: Secondary | ICD-10-CM | POA: Diagnosis not present

## 2019-02-09 DIAGNOSIS — H5462 Unqualified visual loss, left eye, normal vision right eye: Secondary | ICD-10-CM | POA: Diagnosis not present

## 2019-02-09 DIAGNOSIS — I130 Hypertensive heart and chronic kidney disease with heart failure and stage 1 through stage 4 chronic kidney disease, or unspecified chronic kidney disease: Secondary | ICD-10-CM | POA: Diagnosis not present

## 2019-02-09 DIAGNOSIS — M1990 Unspecified osteoarthritis, unspecified site: Secondary | ICD-10-CM | POA: Diagnosis not present

## 2019-02-09 DIAGNOSIS — I69328 Other speech and language deficits following cerebral infarction: Secondary | ICD-10-CM | POA: Diagnosis not present

## 2019-02-11 DIAGNOSIS — H814 Vertigo of central origin: Secondary | ICD-10-CM | POA: Diagnosis not present

## 2019-02-11 DIAGNOSIS — I69328 Other speech and language deficits following cerebral infarction: Secondary | ICD-10-CM | POA: Diagnosis not present

## 2019-02-11 DIAGNOSIS — Z7902 Long term (current) use of antithrombotics/antiplatelets: Secondary | ICD-10-CM | POA: Diagnosis not present

## 2019-02-11 DIAGNOSIS — K819 Cholecystitis, unspecified: Secondary | ICD-10-CM | POA: Diagnosis not present

## 2019-02-11 DIAGNOSIS — M6281 Muscle weakness (generalized): Secondary | ICD-10-CM | POA: Diagnosis not present

## 2019-02-11 DIAGNOSIS — N183 Chronic kidney disease, stage 3 (moderate): Secondary | ICD-10-CM | POA: Diagnosis not present

## 2019-02-11 DIAGNOSIS — Z7982 Long term (current) use of aspirin: Secondary | ICD-10-CM | POA: Diagnosis not present

## 2019-02-11 DIAGNOSIS — M1990 Unspecified osteoarthritis, unspecified site: Secondary | ICD-10-CM | POA: Diagnosis not present

## 2019-02-11 DIAGNOSIS — I130 Hypertensive heart and chronic kidney disease with heart failure and stage 1 through stage 4 chronic kidney disease, or unspecified chronic kidney disease: Secondary | ICD-10-CM | POA: Diagnosis not present

## 2019-02-11 DIAGNOSIS — K219 Gastro-esophageal reflux disease without esophagitis: Secondary | ICD-10-CM | POA: Diagnosis not present

## 2019-02-11 DIAGNOSIS — E785 Hyperlipidemia, unspecified: Secondary | ICD-10-CM | POA: Diagnosis not present

## 2019-02-11 DIAGNOSIS — H5462 Unqualified visual loss, left eye, normal vision right eye: Secondary | ICD-10-CM | POA: Diagnosis not present

## 2019-02-11 DIAGNOSIS — M069 Rheumatoid arthritis, unspecified: Secondary | ICD-10-CM | POA: Diagnosis not present

## 2019-02-11 DIAGNOSIS — I5032 Chronic diastolic (congestive) heart failure: Secondary | ICD-10-CM | POA: Diagnosis not present

## 2019-02-11 DIAGNOSIS — I77811 Abdominal aortic ectasia: Secondary | ICD-10-CM | POA: Diagnosis not present

## 2019-02-11 DIAGNOSIS — I69398 Other sequelae of cerebral infarction: Secondary | ICD-10-CM | POA: Diagnosis not present

## 2019-02-11 DIAGNOSIS — Z87891 Personal history of nicotine dependence: Secondary | ICD-10-CM | POA: Diagnosis not present

## 2019-02-12 ENCOUNTER — Telehealth: Payer: Self-pay

## 2019-02-12 NOTE — Telephone Encounter (Signed)
I don't know if telephone visit will be sufficient.  Given current circumstances I would think so- but I don't know if Medicare has made any rulings on this.  I would think home health companies would know if there has been any change of rules for current pandemic.  There is no way we can bring 85 year old in office at this time.

## 2019-02-12 NOTE — Telephone Encounter (Signed)
Copied from [place] [phone]. Topic: Quick Communication - Home Health Verbal Orders >> Feb 11, 2019  4:08 PM Ivar Drape wrote: Caller/Agency:  Lowella Bandy w/[facility] Number: [phone]  [facility] was ordered on the referral but the patient declined it because they have a home aid themselves.

## 2019-02-12 NOTE — Telephone Encounter (Signed)
Please see message.   Also Robert Rowe has been trying to set up the new [place] service and none of the care services will see patient due to patient needing to be seen due to a recent fall.   Do you want to do a telephone visit? Will this be acceptable for home health?

## 2019-02-12 NOTE — Telephone Encounter (Signed)
Robert Rowe has been advised and will advise [name].

## 2019-02-14 DIAGNOSIS — N183 Chronic kidney disease, stage 3 (moderate): Secondary | ICD-10-CM | POA: Diagnosis not present

## 2019-02-14 DIAGNOSIS — Z7982 Long term (current) use of aspirin: Secondary | ICD-10-CM | POA: Diagnosis not present

## 2019-02-14 DIAGNOSIS — Z87891 Personal history of nicotine dependence: Secondary | ICD-10-CM | POA: Diagnosis not present

## 2019-02-14 DIAGNOSIS — K819 Cholecystitis, unspecified: Secondary | ICD-10-CM | POA: Diagnosis not present

## 2019-02-14 DIAGNOSIS — I5032 Chronic diastolic (congestive) heart failure: Secondary | ICD-10-CM | POA: Diagnosis not present

## 2019-02-14 DIAGNOSIS — E785 Hyperlipidemia, unspecified: Secondary | ICD-10-CM | POA: Diagnosis not present

## 2019-02-14 DIAGNOSIS — I77811 Abdominal aortic ectasia: Secondary | ICD-10-CM | POA: Diagnosis not present

## 2019-02-14 DIAGNOSIS — K219 Gastro-esophageal reflux disease without esophagitis: Secondary | ICD-10-CM | POA: Diagnosis not present

## 2019-02-14 DIAGNOSIS — H5462 Unqualified visual loss, left eye, normal vision right eye: Secondary | ICD-10-CM | POA: Diagnosis not present

## 2019-02-14 DIAGNOSIS — M1990 Unspecified osteoarthritis, unspecified site: Secondary | ICD-10-CM | POA: Diagnosis not present

## 2019-02-14 DIAGNOSIS — M6281 Muscle weakness (generalized): Secondary | ICD-10-CM | POA: Diagnosis not present

## 2019-02-14 DIAGNOSIS — M069 Rheumatoid arthritis, unspecified: Secondary | ICD-10-CM | POA: Diagnosis not present

## 2019-02-14 DIAGNOSIS — Z7902 Long term (current) use of antithrombotics/antiplatelets: Secondary | ICD-10-CM | POA: Diagnosis not present

## 2019-02-14 DIAGNOSIS — I69328 Other speech and language deficits following cerebral infarction: Secondary | ICD-10-CM | POA: Diagnosis not present

## 2019-02-14 DIAGNOSIS — I69398 Other sequelae of cerebral infarction: Secondary | ICD-10-CM | POA: Diagnosis not present

## 2019-02-14 DIAGNOSIS — I130 Hypertensive heart and chronic kidney disease with heart failure and stage 1 through stage 4 chronic kidney disease, or unspecified chronic kidney disease: Secondary | ICD-10-CM | POA: Diagnosis not present

## 2019-02-14 DIAGNOSIS — H814 Vertigo of central origin: Secondary | ICD-10-CM | POA: Diagnosis not present

## 2019-02-15 ENCOUNTER — Telehealth: Payer: Self-pay | Admitting: Family Medicine

## 2019-02-15 DIAGNOSIS — K819 Cholecystitis, unspecified: Secondary | ICD-10-CM | POA: Diagnosis not present

## 2019-02-15 DIAGNOSIS — N183 Chronic kidney disease, stage 3 (moderate): Secondary | ICD-10-CM | POA: Diagnosis not present

## 2019-02-15 DIAGNOSIS — M069 Rheumatoid arthritis, unspecified: Secondary | ICD-10-CM | POA: Diagnosis not present

## 2019-02-15 DIAGNOSIS — I5032 Chronic diastolic (congestive) heart failure: Secondary | ICD-10-CM | POA: Diagnosis not present

## 2019-02-15 DIAGNOSIS — H5462 Unqualified visual loss, left eye, normal vision right eye: Secondary | ICD-10-CM | POA: Diagnosis not present

## 2019-02-15 DIAGNOSIS — I69398 Other sequelae of cerebral infarction: Secondary | ICD-10-CM | POA: Diagnosis not present

## 2019-02-15 DIAGNOSIS — M1990 Unspecified osteoarthritis, unspecified site: Secondary | ICD-10-CM | POA: Diagnosis not present

## 2019-02-15 DIAGNOSIS — I77811 Abdominal aortic ectasia: Secondary | ICD-10-CM | POA: Diagnosis not present

## 2019-02-15 DIAGNOSIS — Z7902 Long term (current) use of antithrombotics/antiplatelets: Secondary | ICD-10-CM | POA: Diagnosis not present

## 2019-02-15 DIAGNOSIS — I69328 Other speech and language deficits following cerebral infarction: Secondary | ICD-10-CM | POA: Diagnosis not present

## 2019-02-15 DIAGNOSIS — Z87891 Personal history of nicotine dependence: Secondary | ICD-10-CM | POA: Diagnosis not present

## 2019-02-15 DIAGNOSIS — K219 Gastro-esophageal reflux disease without esophagitis: Secondary | ICD-10-CM | POA: Diagnosis not present

## 2019-02-15 DIAGNOSIS — Z7982 Long term (current) use of aspirin: Secondary | ICD-10-CM | POA: Diagnosis not present

## 2019-02-15 DIAGNOSIS — H814 Vertigo of central origin: Secondary | ICD-10-CM | POA: Diagnosis not present

## 2019-02-15 DIAGNOSIS — I130 Hypertensive heart and chronic kidney disease with heart failure and stage 1 through stage 4 chronic kidney disease, or unspecified chronic kidney disease: Secondary | ICD-10-CM | POA: Diagnosis not present

## 2019-02-15 DIAGNOSIS — M6281 Muscle weakness (generalized): Secondary | ICD-10-CM | POA: Diagnosis not present

## 2019-02-15 DIAGNOSIS — E785 Hyperlipidemia, unspecified: Secondary | ICD-10-CM | POA: Diagnosis not present

## 2019-02-15 MED ORDER — TRAMADOL HCL 50 MG PO TABS: 50.0000 mg | ORAL_TABLET | Freq: Four times a day (QID) | ORAL | 0 refills | Status: DC | PRN

## 2019-02-15 NOTE — Telephone Encounter (Signed)
Please see message. °

## 2019-02-15 NOTE — Telephone Encounter (Signed)
Sent in limited Tramadol for as needed use.

## 2019-02-15 NOTE — Telephone Encounter (Signed)
Patient states that he is taking this and he is asking for something stronger. Patient states that this is not helping him at all.   Please advise.

## 2019-02-15 NOTE — Telephone Encounter (Signed)
I feel that Tylenol 500 mg one to two every 6 hours is safest option- especially at his age.

## 2019-02-15 NOTE — Telephone Encounter (Signed)
Copied from [place] [phone]. Topic: Quick Communication - See Telephone Encounter >> Feb 15, 2019  2:40 PM Rayann Heman wrote: Pt called and left a voicemail that stated that he fell at the nursing home and hurt his back. Pt would like something called in for the pain. Please advise

## 2019-02-21 ENCOUNTER — Telehealth: Payer: Self-pay | Admitting: Family Medicine

## 2019-02-21 NOTE — Telephone Encounter (Signed)
Copied from [place] [phone]. Topic: Quick Communication - Rx Refill/Question >> Feb 21, 2019  9:56 AM Sheran Luz wrote: Medication: medrol dose pack  Patient is requesting medrol dose pack sent into pharmacy. Patient states that he is still having back problems and knows this will help.   Preferred Pharmacy (with phone number or street name):[id number] - [place], [place] [phone] (Phone) [phone] (Fax)

## 2019-02-21 NOTE — Telephone Encounter (Signed)
Pt called again about this med.  He wants someone to call him today about this med.  He was advised that Dr Elease Hashimoto was no in the office today and it was sent to him for approval. He doesn't care, he still wants to talk with with someone and sent in today  FYI    [phone]

## 2019-02-21 NOTE — Telephone Encounter (Signed)
Spoke with patient the patient, he states that he already got this filled by another provider. Not needed at this time.  Nothing further needed.

## 2019-02-21 NOTE — Telephone Encounter (Signed)
Please see message. °

## 2019-02-22 NOTE — Telephone Encounter (Signed)
FYI

## 2019-03-15 ENCOUNTER — Telehealth: Payer: Self-pay

## 2019-03-15 ENCOUNTER — Ambulatory Visit (INDEPENDENT_AMBULATORY_CARE_PROVIDER_SITE_OTHER): Payer: Medicare Other | Admitting: Family Medicine

## 2019-03-15 ENCOUNTER — Other Ambulatory Visit: Payer: Self-pay

## 2019-03-15 DIAGNOSIS — L299 Pruritus, unspecified: Secondary | ICD-10-CM | POA: Diagnosis not present

## 2019-03-15 MED ORDER — TRIAMCINOLONE ACETONIDE 0.1 % EX CREA: TOPICAL_CREAM | CUTANEOUS | 1 refills | Status: DC

## 2019-03-15 NOTE — Progress Notes (Signed)
This visit type was conducted due to national recommendations for restrictions regarding the COVID-19 pandemic in an effort to limit this patient's exposure and mitigate transmission in our community.   Virtual Visit via Telephone Note  I connected with Robert Rowe on 03/15/19 at 11:45 AM EDT by telephone and verified that I am speaking with the correct person using two identifiers.   I discussed the limitations, risks, security and privacy concerns of performing an evaluation and management service by telephone and the availability of in person appointments. I also discussed with the patient that there may be a patient responsible charge related to this service. The patient expressed understanding and agreed to proceed.  Location patient: home Location provider: work or home office Participants present for the call: patient, provider Patient did not have a visit in the prior 7 days to address this/these issue(s).   History of Present Illness: Patient called with concern for some itching on his scalp.  He states this is an area about the size of an inch diameter.  He is not aware of any definite scaling.  No blistering.  No pain.  No drainage.  This has been present for several days.  He tried some over-the-counter hydrocortisone cream with minimal relief.  He states his wife has not actually seen any visible rash   Observations/Objective: Patient sounds cheerful and well on the phone. I do not appreciate any SOB. Speech and thought processing are grossly intact. Patient reported vitals:  Assessment and Plan:  Localized pruritus involving the scalp.  He was unable to conduct video visit.  -Given current circumstances we elected to empirically go ahead and try treatment with triamcinolone 0.1% cream twice daily -Touch base by next week if not improving  Follow Up Instructions:  -As above.  Follow-up as needed for persistent rash or pruritus    I did not refer this patient for an OV  in the next 24 hours for this/these issue(s).  I discussed the assessment and treatment plan with the patient. The patient was provided an opportunity to ask questions and all were answered. The patient agreed with the plan and demonstrated an understanding of the instructions.   The patient was advised to call back or seek an in-person evaluation if the symptoms worsen or if the condition fails to improve as anticipated.  I provided 10 minutes of non-face-to-face time during this encounter.   Carolann Littler, MD

## 2019-03-15 NOTE — Telephone Encounter (Signed)
Called patient and he stated that he has ordered pulse oximeter. Patient also stated that he would like a dermatology referral for his scalp. He did not go into detail but wanted to speak to Dr. Elease Hashimoto to see if he could get him something for it now until he could see or speak with dermatology.  Patient has a telephone visit at 11:45am.  Copied from [place] [phone]. Topic: General - Other >> Mar 14, 2019  1:01 PM Carolyn Stare wrote:  Pt ask if the office has  pulse oximeter that he can buy or that can be given to him

## 2019-03-21 ENCOUNTER — Other Ambulatory Visit: Payer: Self-pay | Admitting: Family Medicine

## 2019-03-22 NOTE — Telephone Encounter (Signed)
yes

## 2019-03-22 NOTE — Telephone Encounter (Signed)
This has been filled by Lavon Paganini. [place], PA-C  Does this doctor need to continue filling?

## 2019-04-15 ENCOUNTER — Other Ambulatory Visit: Payer: Self-pay | Admitting: Family Medicine

## 2019-04-29 ENCOUNTER — Ambulatory Visit (INDEPENDENT_AMBULATORY_CARE_PROVIDER_SITE_OTHER): Payer: Medicare Other | Admitting: Family Medicine

## 2019-04-29 ENCOUNTER — Other Ambulatory Visit: Payer: Self-pay

## 2019-04-29 DIAGNOSIS — E039 Hypothyroidism, unspecified: Secondary | ICD-10-CM | POA: Diagnosis not present

## 2019-04-29 DIAGNOSIS — N183 Chronic kidney disease, stage 3 unspecified: Secondary | ICD-10-CM

## 2019-04-29 DIAGNOSIS — I10 Essential (primary) hypertension: Secondary | ICD-10-CM | POA: Diagnosis not present

## 2019-04-29 DIAGNOSIS — I5032 Chronic diastolic (congestive) heart failure: Secondary | ICD-10-CM | POA: Diagnosis not present

## 2019-04-29 NOTE — Progress Notes (Signed)
Patient ID: Robert Rowe, male   DOB: March 17, 1918, 85 y.o.   MRN: [id number]  This visit type was conducted due to national recommendations for restrictions regarding the COVID-19 pandemic in an effort to limit this patient's exposure and mitigate transmission in our community.   Virtual Visit via Telephone Note  I connected with Robert Rowe on 04/29/19 at  1:15 PM EDT by telephone and verified that I am speaking with the correct person using two identifiers.   I discussed the limitations, risks, security and privacy concerns of performing an evaluation and management service by telephone and the availability of in person appointments. I also discussed with the patient that there may be a patient responsible charge related to this service. The patient expressed understanding and agreed to proceed.  Location patient: home Location provider: work or home office Participants present for the call: patient, provider Patient did not have a visit in the prior 7 days to address this/these issue(s).   History of Present Illness: Medical follow-up.  Patient will turn 66 later this year.  History was somewhat difficult.  He does have some cognitive impairment and did repeat himself some.  He has multiple medical problems including history of cerebrovascular disease, hypothyroidism, hypertension, chronic kidney disease, chronic diastolic heart failure, GERD, dyslipidemia, osteoarthritis.  Ambulates with assistance with a walker.  Wife is primary caregiver.  He had a hospitalization last year earlier followed by rehab stay and has been back home for several weeks now.  Overall fairly stable.  Denies any recent fever, chills, cough, chest pains.  He did not report any recent falls.  Appetite and weight stable.  We tried to review his medications.  He states he is compliant with all.   Observations/Objective: Patient sounds cheerful and well on the phone. I do not appreciate any SOB. Speech and thought  processing are grossly intact. Patient reported vitals:  Assessment and Plan:  #1 hypertension-stable  #2 history of chronic kidney disease stage III -Recheck basic metabolic panel at next office follow-up  #3 hypothyroidism.  TSH was normal back in February  #4 dyslipidemia.  #5 GERD controlled with Protonix  Follow Up Instructions:  -3 months  99441 5-10 99442 11-20 99443 21-30 I did not refer this patient for an OV in the next 24 hours for this/these issue(s).  I discussed the assessment and treatment plan with the patient. The patient was provided an opportunity to ask questions and all were answered. The patient agreed with the plan and demonstrated an understanding of the instructions.   The patient was advised to call back or seek an in-person evaluation if the symptoms worsen or if the condition fails to improve as anticipated.  I provided 22 minutes of non-face-to-face time during this encounter.   Carolann Littler, MD

## 2019-05-01 ENCOUNTER — Other Ambulatory Visit: Payer: Self-pay | Admitting: Family Medicine

## 2019-05-10 ENCOUNTER — Telehealth: Payer: Self-pay

## 2019-05-10 NOTE — Telephone Encounter (Signed)
Please see message.  Please advise. 

## 2019-05-10 NOTE — Telephone Encounter (Signed)
The physician who took over his practice is who I would start with (I can't remember his name)- but they would have all of his records.

## 2019-05-10 NOTE — Telephone Encounter (Signed)
Copied from [place] [phone]. Topic: Referral - Request for Referral >> May 10, 2019  9:43 AM Reyne Dumas L wrote: Has patient seen PCP for this complaint? yes *If NO, is insurance requiring patient see PCP for this issue before PCP can refer them? Referral for which specialty: dermatology Preferred provider/office: no preference Reason for referral: Pt states his dermatologist, Dr. Allyson Sabal, is no longer in practice and he would like to know who Dr. Elease Hashimoto recommends.

## 2019-05-12 ENCOUNTER — Encounter (HOSPITAL_COMMUNITY): Payer: Self-pay | Admitting: Radiology

## 2019-05-12 ENCOUNTER — Inpatient Hospital Stay (HOSPITAL_COMMUNITY)
Admission: EM | Admit: 2019-05-12 | Discharge: 2019-05-20 | DRG: 065 | Disposition: A | Discharge status: Home Health | Payer: Medicare Other | Attending: Internal Medicine | Admitting: Internal Medicine

## 2019-05-12 ENCOUNTER — Other Ambulatory Visit: Payer: Self-pay

## 2019-05-12 ENCOUNTER — Emergency Department (HOSPITAL_COMMUNITY): Payer: Medicare Other

## 2019-05-12 DIAGNOSIS — I69351 Hemiplegia and hemiparesis following cerebral infarction affecting right dominant side: Secondary | ICD-10-CM | POA: Diagnosis not present

## 2019-05-12 DIAGNOSIS — E039 Hypothyroidism, unspecified: Secondary | ICD-10-CM

## 2019-05-12 DIAGNOSIS — I5032 Chronic diastolic (congestive) heart failure: Secondary | ICD-10-CM | POA: Diagnosis present

## 2019-05-12 DIAGNOSIS — N179 Acute kidney failure, unspecified: Secondary | ICD-10-CM | POA: Diagnosis not present

## 2019-05-12 DIAGNOSIS — R54 Age-related physical debility: Secondary | ICD-10-CM | POA: Diagnosis present

## 2019-05-12 DIAGNOSIS — Z8249 Family history of ischemic heart disease and other diseases of the circulatory system: Secondary | ICD-10-CM

## 2019-05-12 DIAGNOSIS — R918 Other nonspecific abnormal finding of lung field: Secondary | ICD-10-CM | POA: Diagnosis not present

## 2019-05-12 DIAGNOSIS — G459 Transient cerebral ischemic attack, unspecified: Secondary | ICD-10-CM

## 2019-05-12 DIAGNOSIS — Z515 Encounter for palliative care: Secondary | ICD-10-CM | POA: Diagnosis present

## 2019-05-12 DIAGNOSIS — I6602 Occlusion and stenosis of left middle cerebral artery: Secondary | ICD-10-CM | POA: Diagnosis not present

## 2019-05-12 DIAGNOSIS — Z7401 Bed confinement status: Secondary | ICD-10-CM | POA: Diagnosis not present

## 2019-05-12 DIAGNOSIS — R531 Weakness: Secondary | ICD-10-CM | POA: Diagnosis not present

## 2019-05-12 DIAGNOSIS — M069 Rheumatoid arthritis, unspecified: Secondary | ICD-10-CM | POA: Diagnosis not present

## 2019-05-12 DIAGNOSIS — R1312 Dysphagia, oropharyngeal phase: Secondary | ICD-10-CM | POA: Diagnosis not present

## 2019-05-12 DIAGNOSIS — M5382 Other specified dorsopathies, cervical region: Secondary | ICD-10-CM | POA: Diagnosis not present

## 2019-05-12 DIAGNOSIS — Z87891 Personal history of nicotine dependence: Secondary | ICD-10-CM

## 2019-05-12 DIAGNOSIS — K219 Gastro-esophageal reflux disease without esophagitis: Secondary | ICD-10-CM | POA: Diagnosis present

## 2019-05-12 DIAGNOSIS — R29712 NIHSS score 12: Secondary | ICD-10-CM | POA: Diagnosis not present

## 2019-05-12 DIAGNOSIS — I130 Hypertensive heart and chronic kidney disease with heart failure and stage 1 through stage 4 chronic kidney disease, or unspecified chronic kidney disease: Secondary | ICD-10-CM | POA: Diagnosis present

## 2019-05-12 DIAGNOSIS — R29702 NIHSS score 2: Secondary | ICD-10-CM | POA: Diagnosis not present

## 2019-05-12 DIAGNOSIS — I6381 Other cerebral infarction due to occlusion or stenosis of small artery: Principal | ICD-10-CM | POA: Diagnosis present

## 2019-05-12 DIAGNOSIS — E785 Hyperlipidemia, unspecified: Secondary | ICD-10-CM | POA: Diagnosis present

## 2019-05-12 DIAGNOSIS — R4781 Slurred speech: Secondary | ICD-10-CM | POA: Diagnosis not present

## 2019-05-12 DIAGNOSIS — Z1159 Encounter for screening for other viral diseases: Secondary | ICD-10-CM | POA: Diagnosis not present

## 2019-05-12 DIAGNOSIS — R402252 Coma scale, best verbal response, oriented, at arrival to emergency department: Secondary | ICD-10-CM | POA: Diagnosis present

## 2019-05-12 DIAGNOSIS — R482 Apraxia: Secondary | ICD-10-CM | POA: Diagnosis not present

## 2019-05-12 DIAGNOSIS — H5462 Unqualified visual loss, left eye, normal vision right eye: Secondary | ICD-10-CM | POA: Diagnosis present

## 2019-05-12 DIAGNOSIS — N400 Enlarged prostate without lower urinary tract symptoms: Secondary | ICD-10-CM | POA: Diagnosis present

## 2019-05-12 DIAGNOSIS — R262 Difficulty in walking, not elsewhere classified: Secondary | ICD-10-CM | POA: Diagnosis not present

## 2019-05-12 DIAGNOSIS — I651 Occlusion and stenosis of basilar artery: Secondary | ICD-10-CM | POA: Diagnosis present

## 2019-05-12 DIAGNOSIS — R5381 Other malaise: Secondary | ICD-10-CM | POA: Diagnosis not present

## 2019-05-12 DIAGNOSIS — I63512 Cerebral infarction due to unspecified occlusion or stenosis of left middle cerebral artery: Secondary | ICD-10-CM | POA: Diagnosis not present

## 2019-05-12 DIAGNOSIS — R29810 Facial weakness: Secondary | ICD-10-CM | POA: Diagnosis not present

## 2019-05-12 DIAGNOSIS — N183 Chronic kidney disease, stage 3 unspecified: Secondary | ICD-10-CM | POA: Diagnosis present

## 2019-05-12 DIAGNOSIS — I639 Cerebral infarction, unspecified: Secondary | ICD-10-CM

## 2019-05-12 DIAGNOSIS — R1310 Dysphagia, unspecified: Secondary | ICD-10-CM

## 2019-05-12 DIAGNOSIS — R0902 Hypoxemia: Secondary | ICD-10-CM | POA: Diagnosis not present

## 2019-05-12 DIAGNOSIS — R5383 Other fatigue: Secondary | ICD-10-CM | POA: Diagnosis not present

## 2019-05-12 DIAGNOSIS — Z7902 Long term (current) use of antithrombotics/antiplatelets: Secondary | ICD-10-CM

## 2019-05-12 DIAGNOSIS — Z882 Allergy status to sulfonamides status: Secondary | ICD-10-CM

## 2019-05-12 DIAGNOSIS — Z79890 Hormone replacement therapy (postmenopausal): Secondary | ICD-10-CM

## 2019-05-12 DIAGNOSIS — S199XXA Unspecified injury of neck, initial encounter: Secondary | ICD-10-CM | POA: Diagnosis not present

## 2019-05-12 DIAGNOSIS — M2550 Pain in unspecified joint: Secondary | ICD-10-CM | POA: Diagnosis not present

## 2019-05-12 DIAGNOSIS — G959 Disease of spinal cord, unspecified: Secondary | ICD-10-CM | POA: Diagnosis not present

## 2019-05-12 DIAGNOSIS — R0602 Shortness of breath: Secondary | ICD-10-CM

## 2019-05-12 DIAGNOSIS — R1311 Dysphagia, oral phase: Secondary | ICD-10-CM | POA: Diagnosis not present

## 2019-05-12 DIAGNOSIS — I672 Cerebral atherosclerosis: Secondary | ICD-10-CM | POA: Diagnosis not present

## 2019-05-12 DIAGNOSIS — R402142 Coma scale, eyes open, spontaneous, at arrival to emergency department: Secondary | ICD-10-CM | POA: Diagnosis present

## 2019-05-12 DIAGNOSIS — G9520 Unspecified cord compression: Secondary | ICD-10-CM | POA: Diagnosis present

## 2019-05-12 DIAGNOSIS — Z79899 Other long term (current) drug therapy: Secondary | ICD-10-CM

## 2019-05-12 DIAGNOSIS — R471 Dysarthria and anarthria: Secondary | ICD-10-CM | POA: Diagnosis not present

## 2019-05-12 DIAGNOSIS — R402362 Coma scale, best motor response, obeys commands, at arrival to emergency department: Secondary | ICD-10-CM | POA: Diagnosis present

## 2019-05-12 DIAGNOSIS — Z03818 Encounter for observation for suspected exposure to other biological agents ruled out: Secondary | ICD-10-CM | POA: Diagnosis not present

## 2019-05-12 DIAGNOSIS — I6300 Cerebral infarction due to thrombosis of unspecified precerebral artery: Secondary | ICD-10-CM | POA: Diagnosis not present

## 2019-05-12 DIAGNOSIS — J9811 Atelectasis: Secondary | ICD-10-CM | POA: Diagnosis not present

## 2019-05-12 DIAGNOSIS — I10 Essential (primary) hypertension: Secondary | ICD-10-CM

## 2019-05-12 DIAGNOSIS — I340 Nonrheumatic mitral (valve) insufficiency: Secondary | ICD-10-CM | POA: Diagnosis not present

## 2019-05-12 DIAGNOSIS — Z885 Allergy status to narcotic agent status: Secondary | ICD-10-CM

## 2019-05-12 DIAGNOSIS — I6389 Other cerebral infarction: Secondary | ICD-10-CM | POA: Diagnosis not present

## 2019-05-12 DIAGNOSIS — Z7189 Other specified counseling: Secondary | ICD-10-CM

## 2019-05-12 DIAGNOSIS — I6521 Occlusion and stenosis of right carotid artery: Secondary | ICD-10-CM | POA: Diagnosis not present

## 2019-05-12 LAB — I-STAT CHEM 8, ED
BUN: 20 mg/dL (ref 8–23)
Calcium, Ion: 1.2 mmol/L (ref 1.15–1.40)
Chloride: 105 mmol/L (ref 98–111)
Creatinine, Ser: 1.6 mg/dL — ABNORMAL HIGH (ref 0.61–1.24)
Glucose, Bld: 124 mg/dL — ABNORMAL HIGH (ref 70–99)
HCT: 43 % (ref 39.0–52.0)
Hemoglobin: 14.6 g/dL (ref 13.0–17.0)
Potassium: 4.3 mmol/L (ref 3.5–5.1)
Sodium: 139 mmol/L (ref 135–145)
TCO2: 27 mmol/L (ref 22–32)

## 2019-05-12 LAB — COMPREHENSIVE METABOLIC PANEL
ALT: 11 U/L (ref 0–44)
AST: 15 U/L (ref 15–41)
Albumin: 3.6 g/dL (ref 3.5–5.0)
Alkaline Phosphatase: 79 U/L (ref 38–126)
Anion gap: 10 (ref 5–15)
BUN: 19 mg/dL (ref 8–23)
CO2: 24 mmol/L (ref 22–32)
Calcium: 9.6 mg/dL (ref 8.9–10.3)
Chloride: 104 mmol/L (ref 98–111)
Creatinine, Ser: 1.57 mg/dL — ABNORMAL HIGH (ref 0.61–1.24)
GFR calc Af Amer: 42 mL/min — ABNORMAL LOW (ref >60)
GFR calc non Af Amer: 36 mL/min — ABNORMAL LOW (ref >60)
Glucose, Bld: 132 mg/dL — ABNORMAL HIGH (ref 70–99)
Potassium: 4.3 mmol/L (ref 3.5–5.1)
Sodium: 138 mmol/L (ref 135–145)
Total Bilirubin: 0.7 mg/dL (ref 0.3–1.2)
Total Protein: 6.1 g/dL — ABNORMAL LOW (ref 6.5–8.1)

## 2019-05-12 LAB — CBG MONITORING, ED: Glucose-Capillary: 114 mg/dL — ABNORMAL HIGH (ref 70–99)

## 2019-05-12 LAB — CBC
HCT: 44 % (ref 39.0–52.0)
Hemoglobin: 14.1 g/dL (ref 13.0–17.0)
MCH: 31.6 pg (ref 26.0–34.0)
MCHC: 32 g/dL (ref 30.0–36.0)
MCV: 98.7 fL (ref 80.0–100.0)
Platelets: 232 10*3/uL (ref 150–400)
RBC: 4.46 MIL/uL (ref 4.22–5.81)
RDW: 13.3 % (ref 11.5–15.5)
WBC: 7.7 10*3/uL (ref 4.0–10.5)
nRBC: 0 % (ref 0.0–0.2)

## 2019-05-12 LAB — DIFFERENTIAL
Abs Immature Granulocytes: 0.02 10*3/uL (ref 0.00–0.07)
Basophils Absolute: 0.1 10*3/uL (ref 0.0–0.1)
Basophils Relative: 1 %
Eosinophils Absolute: 0.2 10*3/uL (ref 0.0–0.5)
Eosinophils Relative: 3 %
Immature Granulocytes: 0 %
Lymphocytes Relative: 25 %
Lymphs Abs: 1.9 10*3/uL (ref 0.7–4.0)
Monocytes Absolute: 0.8 10*3/uL (ref 0.1–1.0)
Monocytes Relative: 11 %
Neutro Abs: 4.7 10*3/uL (ref 1.7–7.7)
Neutrophils Relative %: 60 %

## 2019-05-12 LAB — PROTIME-INR
INR: 1.1 (ref 0.8–1.2)
Prothrombin Time: 13.7 seconds (ref 11.4–15.2)

## 2019-05-12 LAB — SARS CORONAVIRUS 2 BY RT PCR (HOSPITAL ORDER, PERFORMED IN ~~LOC~~ HOSPITAL LAB): SARS Coronavirus 2: NEGATIVE

## 2019-05-12 LAB — APTT: aPTT: 32 seconds (ref 24–36)

## 2019-05-12 MED ORDER — ALBUTEROL SULFATE (2.5 MG/3ML) 0.083% IN NEBU
2.5000 mg | INHALATION_SOLUTION | RESPIRATORY_TRACT | Status: DC | PRN
  Administered 2019-05-15 – 2019-05-17 (×4): 2.5 mg via RESPIRATORY_TRACT
  Filled 2019-05-12 (×4): qty 3

## 2019-05-12 MED ORDER — NITROGLYCERIN 0.4 MG SL SUBL: 0.4000 mg | SUBLINGUAL_TABLET | SUBLINGUAL | Status: DC | PRN

## 2019-05-12 MED ORDER — CLOPIDOGREL BISULFATE 75 MG PO TABS
75.0000 mg | ORAL_TABLET | Freq: Every day | ORAL | Status: DC
  Administered 2019-05-13 – 2019-05-20 (×8): 75 mg via ORAL
  Filled 2019-05-12 (×8): qty 1

## 2019-05-12 MED ORDER — LEVOTHYROXINE SODIUM 50 MCG PO TABS
50.0000 ug | ORAL_TABLET | Freq: Every day | ORAL | Status: DC
  Administered 2019-05-14 – 2019-05-20 (×7): 50 ug via ORAL
  Filled 2019-05-12 (×7): qty 1

## 2019-05-12 MED ORDER — ASPIRIN EC 81 MG PO TBEC
81.0000 mg | DELAYED_RELEASE_TABLET | Freq: Every day | ORAL | Status: DC
  Administered 2019-05-13 – 2019-05-20 (×8): 81 mg via ORAL
  Filled 2019-05-12 (×8): qty 1

## 2019-05-12 MED ORDER — SODIUM CHLORIDE 0.9% FLUSH
3.0000 mL | Freq: Once | INTRAVENOUS | Status: DC
Start: 2019-05-12 — End: 2019-05-20

## 2019-05-12 MED ORDER — METOPROLOL SUCCINATE ER 25 MG PO TB24: 25.0000 mg | ORAL_TABLET | Freq: Every day | ORAL | Status: DC

## 2019-05-12 MED ORDER — TRAMADOL HCL 50 MG PO TABS
50.0000 mg | ORAL_TABLET | Freq: Four times a day (QID) | ORAL | Status: DC | PRN
  Administered 2019-05-15 – 2019-05-20 (×5): 50 mg via ORAL
  Filled 2019-05-12 (×6): qty 1

## 2019-05-12 MED ORDER — HYDRALAZINE HCL 20 MG/ML IJ SOLN: 10.0000 mg | Freq: Four times a day (QID) | INTRAMUSCULAR | Status: DC | PRN

## 2019-05-12 MED ORDER — SENNOSIDES-DOCUSATE SODIUM 8.6-50 MG PO TABS
1.0000 | ORAL_TABLET | Freq: Every evening | ORAL | Status: DC | PRN
  Administered 2019-05-15 – 2019-05-19 (×2): 1 via ORAL
  Filled 2019-05-12 (×2): qty 1

## 2019-05-12 MED ORDER — PANTOPRAZOLE SODIUM 40 MG PO TBEC
40.0000 mg | DELAYED_RELEASE_TABLET | Freq: Every day | ORAL | Status: DC
  Administered 2019-05-13 – 2019-05-20 (×8): 40 mg via ORAL
  Filled 2019-05-12 (×8): qty 1

## 2019-05-12 MED ORDER — MECLIZINE HCL 25 MG PO TABS
12.5000 mg | ORAL_TABLET | Freq: Three times a day (TID) | ORAL | Status: DC | PRN
  Filled 2019-05-12: qty 0.5

## 2019-05-12 MED ORDER — FUROSEMIDE 20 MG PO TABS: 20.0000 mg | ORAL_TABLET | Freq: Every day | ORAL | Status: DC

## 2019-05-12 MED ORDER — ADULT MULTIVITAMIN W/MINERALS CH
1.0000 | ORAL_TABLET | Freq: Every day | ORAL | Status: DC
  Administered 2019-05-13 – 2019-05-19 (×7): 1 via ORAL
  Filled 2019-05-12 (×6): qty 1

## 2019-05-12 MED ORDER — LATANOPROST 0.005 % OP SOLN
1.0000 [drp] | Freq: Every day | OPHTHALMIC | Status: DC
  Administered 2019-05-12 – 2019-05-19 (×8): 1 [drp] via OPHTHALMIC
  Filled 2019-05-12: qty 2.5

## 2019-05-12 MED ORDER — ENOXAPARIN SODIUM 30 MG/0.3ML ~~LOC~~ SOLN
30.0000 mg | SUBCUTANEOUS | Status: DC
  Administered 2019-05-12 – 2019-05-19 (×8): 30 mg via SUBCUTANEOUS
  Filled 2019-05-12 (×8): qty 0.3

## 2019-05-12 MED ORDER — STROKE: EARLY STAGES OF RECOVERY BOOK
Freq: Once | Status: DC
  Filled 2019-05-12: qty 1

## 2019-05-12 MED ORDER — ATORVASTATIN CALCIUM 40 MG PO TABS
40.0000 mg | ORAL_TABLET | Freq: Every day | ORAL | Status: DC
  Administered 2019-05-13 – 2019-05-19 (×7): 40 mg via ORAL
  Filled 2019-05-12 (×8): qty 1

## 2019-05-12 MED ORDER — ACETAMINOPHEN 325 MG PO TABS
650.0000 mg | ORAL_TABLET | Freq: Four times a day (QID) | ORAL | Status: DC | PRN
  Administered 2019-05-19: 22:00:00 650 mg via ORAL
  Filled 2019-05-12: qty 2

## 2019-05-12 NOTE — ED Triage Notes (Signed)
Pt arrives to ED from home as a Code Stroke with [facility]. Pt has complaints of right sided arm weakness and dysarthria with a last known normal at 0230 when he awoke from bed.

## 2019-05-12 NOTE — ED Triage Notes (Signed)
In by EMS with reports of bilateral weakness, worse to the R.

## 2019-05-12 NOTE — Consult Note (Signed)
Neurological consultation  Referring Physician: Dr. Vallery Ridge    Reason for Consult: Code stroke  HPI: Robert Rowe is an 85 y.o. male who is a retired internal medicine doctor and a founding [employer], gives a very good account of symptoms and history. He says he got up at 0215 to go to bathroom feeling normal. Then when he awoke for the day ~10am he felt like he had right side weakness and family noted slurred speech. EMS was called and he was brought to ER as a Code Stroke. His speech had resolved by the time he arrived and there was only slight right arm weakness noted. Emergent CTH done, showed no acute changes. Due to CKD, no CTA/P done. He will be admitted for stroke/TIA wk up.   Date last known well: 05/12/19 Time last known well: 0215  tPA Given: outside of time window. No LVO and resolving symptoms  Past Medical History Past Medical History:  Diagnosis Date  . Acute cholecystitis 12/2018  . BENIGN PROSTATIC HYPERTROPHY 04/05/2007  . 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 CHEST DISCOMFORT 04/25/2009  . Chronic diastolic CHF (congestive heart failure) ([place])    a. Echo 12/16: mild LVH, EF 55-60%, no RWMA, Gr 1 DD, mild AI, MAC, mild LAE, normal RVF, PASP 37 mmHg  . Chronic kidney disease    chronic   . DJD (degenerative joint disease)   . Dysrhythmia    pvc's   . GERD 12/03/2007  . Hiatal hernia   . History of nuclear stress test    Myoview 1/17: EF 53%, normal perfusion; Low Risk  . HYPERLIPIDEMIA 04/05/2007  . Hypothyroidism   . Osteoarth NOS-Unspec 04/05/2007  . Palpitations 02/12/2010  . PEDAL EDEMA 05/01/2008  . Rheumatoid arthritis(714.0) 04/05/2007  . Shortness of breath dyspnea   . TRANSIENT ISCHEMIC ATTACK, HX OF 04/05/2007   ?     Surgical History Past Surgical History:  Procedure Laterality Date  . BALLOON DILATION N/A 03/12/2013   Procedure: BALLOON DILATION;  Surgeon: Inda Castle, MD;  Location: [facility];  Service: Endoscopy;   Laterality: N/A;  . CARPAL TUNNEL RELEASE  11/28/2018   Left hand  . CATARACT EXTRACTION  2001   bilateral  . CYSTOSCOPY WITH URETHRAL DILATATION N/A 09/24/2015   Procedure: CYSTOSCOPY WITH COOK BALLOON DILATATION OF URETHRAL STRICTURE ;  Surgeon: Carolan Clines, MD;  Location: [facility];  Service: Urology;  Laterality: N/A;  . CYSTOSCOPY WITH URETHRAL DILATATION N/A 07/20/2016   Procedure: CYSTOSCOPY WITH URETHRAL DILATATION;  Surgeon: Alexis Frock, MD;  Location: [facility];  Service: Urology;  Laterality: N/A;  1 HOUR  . ESOPHAGOGASTRODUODENOSCOPY N/A 03/12/2013   Procedure: ESOPHAGOGASTRODUODENOSCOPY (EGD);  Surgeon: Inda Castle, MD;  Location: [facility];  Service: Endoscopy;  Laterality: N/A;  . HEMORRHOID SURGERY    . HERNIA REPAIR     inguinal x5  . KNEE SURGERY  arthroscopic   left  . PROSTATE CRYOABLATION     2000  . SHOULDER SURGERY  few years ago   left  . TRANSURETHRAL RESECTION OF PROSTATE N/A 03/19/2015   Procedure: TRANSURETHRAL RESECTION OF THE PROSTATE (TURP);  Surgeon: Carolan Clines, MD;  Location: [facility];  Service: Urology;  Laterality: N/A;    Family History  Family History  Problem Relation Age of Onset  . Breast cancer Mother   . Heart disease Father   . Heart attack Father   . Lymphoma Brother   . Stroke Neg Hx   .  Hypertension Neg Hx     Social History:   reports that he quit smoking about 65 years ago. His smoking use included cigarettes. He has never used smokeless tobacco. He reports current alcohol use. He reports that he does not use drugs.  Allergies:  Allergies  Allergen Reactions  . Codeine Other (See Comments)    Patient was "all over the place"- undesired side effect  . Sulfasalazine Other (See Comments)    Reaction not recalled  . Sulfonamide Derivatives Other (See Comments)    Reaction not recalled    Home Medications:  (Not in a hospital admission)   Hospital Medications . sodium chloride flush  3 mL Intravenous Once     ROS:  History obtained from pt  General ROS: negative for - chills, fatigue, fever, night sweats, weight gain or weight loss Psychological ROS: negative for - behavioral disorder, hallucinations, memory difficulties, mood swings or suicidal ideation Ophthalmic ROS: negative for - blurry vision, double vision, eye pain or loss of vision ENT ROS: negative for - epistaxis, nasal discharge, oral lesions, sore throat, tinnitus or vertigo Allergy and Immunology ROS: negative for - hives or itchy/watery eyes Hematological and Lymphatic ROS: negative for - bleeding problems, bruising or swollen lymph nodes Endocrine ROS: negative for - galactorrhea, hair pattern changes, polydipsia/polyuria or temperature intolerance Respiratory ROS: negative for - cough, hemoptysis, shortness of breath or wheezing Cardiovascular ROS: negative for - chest pain, dyspnea on exertion, edema or irregular heartbeat Gastrointestinal ROS: negative for - abdominal pain, diarrhea, hematemesis, nausea/vomiting or stool incontinence Genito-Urinary ROS: negative for - dysuria, hematuria, incontinence or urinary frequency/urgency Musculoskeletal ROS: negative for - joint swelling or muscular weakness Neurological ROS: as noted in HPI Dermatological ROS: negative for rash and skin lesion changes   Physical Examination:  There were no vitals filed for this visit.  General - no acute distress. Well nourished and younger appearing than stated age Heart - Regular rate and rhythm - no murmer appreciated Lungs - Clear to auscultation  Abdomen - Soft - non tender Extremities - Distal pulses intact - no edema Skin - Warm and dry   Neurologic Examination:  Mental Status: Alert, oriented, thought content appropriate.  Speech without evidence of aphasia. There was some subtle dysarthria initially, but this cleared by time CT completed. Able to follow 3 step commands without difficulty. Cranial Nerves: II: Discs not  visualized; Visual fields grossly normal, pupils equal, round, reactive to light III,IV, VI: ptosis not present, extra-ocular motions intact bilaterally V,VII: smile symmetric, facial light touch sensation normal bilaterally VIII: hearing normal bilaterally IX,X: gag reflex present XI: bilateral shoulder shrug XII: midline tongue extension Motor: RUE - 4/5, however no drift when arms outstretched LUE - 5/5   RLE - 5/5    LLE - 5/5 Tone and bulk:normal tone throughout; no atrophy noted Sensory: Light touch intact throughout, bilaterally Deep Tendon Reflexes: 2+ and symmetric throughout Plantars: Right: downgoing   Left: downgoing Cerebellar: normal finger-to-nose and normal heel-to-shin test Gait: deferred at this time.   NIHSS 1a Level of Conscious:0 1b LOC Questions: 0 1c LOC Commands: 0 2 Best Gaze: 0 3 Visual: 0 4 Facial Palsy: 0 5a Motor Arm - left: 0 5b Motor Arm - Right: no drift, but weakness noted on full motor exam 6a Motor Leg - Left: 0 6b Motor Leg - Right: 0 7 Limb Ataxia: 0 8 Sensory: 0 9 Best Language: 1 10 Dysarthria:0 11 Extinct. and Inattention: TOTAL: 2  LABORATORY STUDIES:  Basic  Metabolic Panel: Recent Labs  Lab 05/12/19 1100  NA 139  K 4.3  CL 105  GLUCOSE 124*  BUN 20  CREATININE 1.60*    Liver Function Tests: No results for input(s): AST, ALT, ALKPHOS, BILITOT, PROT, ALBUMIN in the last 168 hours. No results for input(s): LIPASE, AMYLASE in the last 168 hours. No results for input(s): AMMONIA in the last 168 hours.  CBC: Recent Labs  Lab 05/12/19 1032 05/12/19 1100  WBC 7.7  --   NEUTROABS 4.7  --   HGB 14.1 14.6  HCT 44.0 43.0  MCV 98.7  --   PLT 232  --     Cardiac Enzymes: No results for input(s): CKTOTAL, CKMB, CKMBINDEX, TROPONINI in the last 168 hours.  BNP: Invalid input(s): POCBNP  CBG: Recent Labs  Lab 05/12/19 1049  GLUCAP 114*    Microbiology:   Coagulation Studies: Recent Labs    05/12/19 1032   LABPROT 13.7  INR 1.1    Urinalysis: No results for input(s): COLORURINE, LABSPEC, PHURINE, GLUCOSEU, HGBUR, BILIRUBINUR, KETONESUR, PROTEINUR, UROBILINOGEN, NITRITE, LEUKOCYTESUR in the last 168 hours.  Invalid input(s): APPERANCEUR  Lipid Panel:     Component Value Date/Time   CHOL 252 (H) 01/01/2019 0421   TRIG 204 (H) 01/01/2019 0421   HDL 34 (L) 01/01/2019 0421   CHOLHDL 7.4 01/01/2019 0421   VLDL 41 (H) 01/01/2019 0421   LDLCALC 177 (H) 01/01/2019 0421    HgbA1C:  Lab Results  Component Value Date   HGBA1C 6.2 (H) 01/01/2019    IMAGING: Ct Head Code Stroke Wo Contrast  Result Date: 05/12/2019 CLINICAL DATA:  Code stroke. Left-sided weakness slurred speech. LV0 positive EXAM: CT HEAD WITHOUT CONTRAST TECHNIQUE: Contiguous axial images were obtained from the base of the skull through the vertex without intravenous contrast. COMPARISON:  MRI head 12/31/2018 FINDINGS: Brain: Moderate atrophy with ventricular enlargement which is stable from prior studies. Negative for acute infarct.  Negative for acute hemorrhage or mass. Vascular: Negative for hyperdense vessel Skull: No skull lesions. Extensive calcified pannus posterior to the dens causing spinal stenosis as noted on prior MRI. Sinuses/Orbits: Mild mucosal edema paranasal sinuses. Bilateral cataract surgery. Other: None ASPECTS ([place] Stroke Program Early CT Score) - Ganglionic level infarction (caudate, lentiform nuclei, internal capsule, insula, M1-M3 cortex): 7 - Supraganglionic infarction (M4-M6 cortex): 3 Total score (0-10 with 10 being normal): 10 IMPRESSION: 1. No acute intracranial abnormality 2. Atrophy and mild chronic microvascular ischemia 3. ASPECTS is 10 4. Severe pannus posterior to the dens causing spinal stenosis and cord compression on the right as noted on prior MRI 5. These results were called by telephone at the time of interpretation on 05/12/2019 at 11:10 am to Dr. Rory Percy, who verbally acknowledged these  results. Electronically Signed   By: Franchot Gallo M.D.   On: 05/12/2019 11:13     Transthoracic Echocardiogram - pending   Bilateral Carotid Dopplers - pending   Assessment: 85 y.o. male  Who awakened with slurred speech and right arm weakness. Not tPA candidate as he is outside tx window and symptoms are mild and resolving. No LVO symptoms. Unable to do CTA/P d/t CKD. Stat CT showed no acute brain changes, but there is some posterior pannus to the dens and spinal stenosis. Will ck MRI brain and Cspine as well as stroke wk up at this time.   # Acute stroke symptoms, right side weakness, dysarthria- CTH neg for bleed or acute findings. MRI to better evaluate for stroke, ddx small  lacunar infarct. However, given the severe pannus causing the spinal stenosis, there may be cord compression on the right, causing the right side weakness, but this would not explain the dysarthria.  # HTN- goals below. Con't BP mgt # Advanced age- still sharp mind and lives independent. Retired Engineer, drilling. # CKD3- monitor labs, gentle IVF. Unable to do CTA  Plan:  HgbA1c, fasting lipid panel in am  MRI, MRA  of the brain without contrast  MR C-spine   PT consult, OT consult, Speech consult  Echocardiogram  Carotid dopplers   Prophylactic therapy - Antiplatelet medication  Allow for permissive hypertension for the first 24-48h - only treat PRN if SBP >220 mmHg. Blood pressures can be gradually normalized to SBP<140 upon discharge  Statin Therapy - LDL goal < 70  Risk factor modification  Telemetry monitoring  Frequent neuro checks  Fall Precautions  DVT prophelaxis  NPO until swallowing evaluation has been passed    Attending Neurologist's note to follow  Desiree Metzger-Cihelka, ARNP-C, ANVP-BC Pager: [phone]   Attending Neurohospitalist Addendum Patient seen and examined with APP/Resident. Agree with the history and physical as documented above. Agree with the plan as  documented, which I helped formulate. I have independently reviewed the chart, obtained history, review of systems and examined the patient.I have personally reviewed pertinent head/neck/spine imaging (CT/MRI). Please feel free to call with any questions. --- Amie Portland, MD [facility] Pager: [phone]  If 7pm to 7am, please call on call as listed on AMION.

## 2019-05-12 NOTE — ED Notes (Signed)
ED TO INPATIENT HANDOFF REPORT  ED Nurse Name and Phone #:  [phone]  S Name/Age/Gender Robert Rowe 85 y.o. male Room/Bed: 035C/035C  Code Status   Code Status: Full Code  Home/SNF/Other Home Patient oriented to: self, place, time and situation Is this baseline? Yes   Triage Complete: Triage complete  Chief Complaint code stroke  Triage Note In by EMS with reports of bilateral weakness, worse to the R.    Pt arrives to ED from home as a Code Stroke with [facility]. Pt has complaints of right sided arm weakness and dysarthria with a last known normal at 0230 when he awoke from bed.    Allergies Allergies  Allergen Reactions  . Codeine Other (See Comments)    Patient was "all over the place"- undesired side effect  . Sulfasalazine Other (See Comments)    Reaction not recalled  . Sulfonamide Derivatives Other (See Comments)    Reaction not recalled    Level of Care/Admitting Diagnosis ED Disposition    ED Disposition Condition Comment   Admit  Hospital Area: [facility] [[id number]]  Level of Care: Telemetry Medical [104]  Covid Evaluation: Screening Protocol (No Symptoms)  Diagnosis: CVA (cerebral vascular accident) [facility]) [[id number]]  Admitting Physician: Guilford Shi [[id number]]  Attending Physician: Guilford Shi [[id number]]  Estimated length of stay: past midnight tomorrow  Certification:: I certify this patient will need inpatient services for at least 2 midnights  PT Class (Do Not Modify): Inpatient [101]  PT Acc Code (Do Not Modify): Private [1]       B Medical/Surgery History Past Medical History:  Diagnosis Date  . Acute cholecystitis 12/2018  . BENIGN PROSTATIC HYPERTROPHY 04/05/2007  . 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 CHEST DISCOMFORT 04/25/2009  . Chronic diastolic CHF (congestive heart failure) ([place])    a. Echo 12/16: mild LVH, EF 55-60%, no RWMA, Gr 1 DD, mild AI, MAC, mild LAE, normal RVF, PASP 37 mmHg   . Chronic kidney disease    chronic   . DJD (degenerative joint disease)   . Dysrhythmia    pvc's   . GERD 12/03/2007  . Hiatal hernia   . History of nuclear stress test    Myoview 1/17: EF 53%, normal perfusion; Low Risk  . HYPERLIPIDEMIA 04/05/2007  . Hypothyroidism   . Osteoarth NOS-Unspec 04/05/2007  . Palpitations 02/12/2010  . PEDAL EDEMA 05/01/2008  . Rheumatoid arthritis(714.0) 04/05/2007  . Shortness of breath dyspnea   . TRANSIENT ISCHEMIC ATTACK, HX OF 04/05/2007   ?    Past Surgical History:  Procedure Laterality Date  . BALLOON DILATION N/A 03/12/2013   Procedure: BALLOON DILATION;  Surgeon: Inda Castle, MD;  Location: [facility];  Service: Endoscopy;  Laterality: N/A;  . CARPAL TUNNEL RELEASE  11/28/2018   Left hand  . CATARACT EXTRACTION  2001   bilateral  . CYSTOSCOPY WITH URETHRAL DILATATION N/A 09/24/2015   Procedure: CYSTOSCOPY WITH COOK BALLOON DILATATION OF URETHRAL STRICTURE ;  Surgeon: Carolan Clines, MD;  Location: [facility];  Service: Urology;  Laterality: N/A;  . CYSTOSCOPY WITH URETHRAL DILATATION N/A 07/20/2016   Procedure: CYSTOSCOPY WITH URETHRAL DILATATION;  Surgeon: Alexis Frock, MD;  Location: [facility];  Service: Urology;  Laterality: N/A;  1 HOUR  . ESOPHAGOGASTRODUODENOSCOPY N/A 03/12/2013   Procedure: ESOPHAGOGASTRODUODENOSCOPY (EGD);  Surgeon: Inda Castle, MD;  Location: [facility];  Service: Endoscopy;  Laterality: N/A;  . HEMORRHOID SURGERY    . HERNIA  REPAIR     inguinal x5  . KNEE SURGERY  arthroscopic   left  . PROSTATE CRYOABLATION     2000  . SHOULDER SURGERY  few years ago   left  . TRANSURETHRAL RESECTION OF PROSTATE N/A 03/19/2015   Procedure: TRANSURETHRAL RESECTION OF THE PROSTATE (TURP);  Surgeon: Carolan Clines, MD;  Location: [facility];  Service: Urology;  Laterality: N/A;     A IV Location/Drains/Wounds Patient Lines/Drains/Airways Status   Active Line/Drains/Airways    Name:   Placement date:   Placement time:    Site:   Days:   Peripheral IV 01/15/19 Right;Posterior;Lateral Forearm   01/15/19    0954    Forearm   117   Peripheral IV 05/12/19 Left Antecubital   05/12/19    1155    Antecubital   less than 1          Intake/Output Last 24 hours No intake or output data in the 24 hours ending 05/12/19 1708  Labs/Imaging Results for orders placed or performed during the hospital encounter of 05/12/19 (from the past 48 hour(s))  Protime-INR     Status: None   Collection Time: 05/12/19 10:32 AM  Result Value Ref Range   Prothrombin Time 13.7 11.4 - 15.2 seconds   INR 1.1 0.8 - 1.2    Comment: (NOTE) INR goal varies based on device and disease states. Performed at [facility], [place] [address] [place], [place] [address]   APTT     Status: None   Collection Time: 05/12/19 10:32 AM  Result Value Ref Range   aPTT 32 24 - 36 seconds    Comment: Performed at [facility] [address] [place] [address]  CBC     Status: None   Collection Time: 05/12/19 10:32 AM  Result Value Ref Range   WBC 7.7 4.0 - 10.5 K/uL   RBC 4.46 4.22 - 5.81 MIL/uL   Hemoglobin 14.1 13.0 - 17.0 g/dL   HCT 44.0 39.0 - 52.0 %   MCV 98.7 80.0 - 100.0 fL   MCH 31.6 26.0 - 34.0 pg   MCHC 32.0 30.0 - 36.0 g/dL   RDW 13.3 11.5 - 15.5 %   Platelets 232 150 - 400 K/uL   nRBC 0.0 0.0 - 0.2 %    Comment: Performed at [facility], [place] [address], [place], [place] [address]  Differential     Status: None   Collection Time: 05/12/19 10:32 AM  Result Value Ref Range   Neutrophils Relative % 60 %   Neutro Abs 4.7 1.7 - 7.7 K/uL   Lymphocytes Relative 25 %   Lymphs Abs 1.9 0.7 - 4.0 K/uL   Monocytes Relative 11 %   Monocytes Absolute 0.8 0.1 - 1.0 K/uL   Eosinophils Relative 3 %   Eosinophils Absolute 0.2 0.0 - 0.5 K/uL   Basophils Relative 1 %   Basophils Absolute 0.1 0.0 - 0.1 K/uL   Immature Granulocytes 0 %   Abs Immature Granulocytes 0.02 0.00 - 0.07 K/uL    Comment: Performed at [facility] [address] [place], [place] [address]  Comprehensive metabolic panel     Status: Abnormal   Collection Time: 05/12/19 10:32 AM  Result Value Ref Range   Sodium 138 135 - 145 mmol/L   Potassium 4.3 3.5 - 5.1 mmol/L   Chloride 104 98 - 111 mmol/L   CO2 24 22 - 32 mmol/L  Glucose, Bld 132 (H) 70 - 99 mg/dL   BUN 19 8 - 23 mg/dL   Creatinine, Ser 1.57 (H) 0.61 - 1.24 mg/dL   Calcium 9.6 8.9 - 10.3 mg/dL   Total Protein 6.1 (L) 6.5 - 8.1 g/dL   Albumin 3.6 3.5 - 5.0 g/dL   AST 15 15 - 41 U/L   ALT 11 0 - 44 U/L   Alkaline Phosphatase 79 38 - 126 U/L   Total Bilirubin 0.7 0.3 - 1.2 mg/dL   GFR calc non Af Amer 36 (L) >60 mL/min   GFR calc Af Amer 42 (L) >60 mL/min   Anion gap 10 5 - 15    Comment: Performed at [facility] [address], [place], [place] [address]  CBG monitoring, ED     Status: Abnormal   Collection Time: 05/12/19 10:49 AM  Result Value Ref Range   Glucose-Capillary 114 (H) 70 - 99 mg/dL  I-stat chem 8, ED     Status: Abnormal   Collection Time: 05/12/19 11:00 AM  Result Value Ref Range   Sodium 139 135 - 145 mmol/L   Potassium 4.3 3.5 - 5.1 mmol/L   Chloride 105 98 - 111 mmol/L   BUN 20 8 - 23 mg/dL   Creatinine, Ser 1.60 (H) 0.61 - 1.24 mg/dL   Glucose, Bld 124 (H) 70 - 99 mg/dL   Calcium, Ion 1.20 1.15 - 1.40 mmol/L   TCO2 27 22 - 32 mmol/L   Hemoglobin 14.6 13.0 - 17.0 g/dL   HCT 43.0 39.0 - 52.0 %  SARS Coronavirus 2 (CEPHEID - Performed in [facility] hospital lab), Hosp Order     Status: None   Collection Time: 05/12/19  3:36 PM   Specimen: Nasopharyngeal Swab  Result Value Ref Range   SARS Coronavirus 2 NEGATIVE NEGATIVE    Comment: (NOTE) If result is NEGATIVE SARS-CoV-2 target nucleic acids are NOT DETECTED. The SARS-CoV-2 RNA is generally detectable in upper and lower  respiratory specimens during the acute phase of infection. The lowest  concentration of SARS-CoV-2 viral copies this assay can detect is 250  copies /  mL. A negative result does not preclude SARS-CoV-2 infection  and should not be used as the sole basis for treatment or other  patient management decisions.  A negative result may occur with  improper specimen collection / handling, submission of specimen other  than nasopharyngeal swab, presence of viral mutation(s) within the  areas targeted by this assay, and inadequate number of viral copies  (<250 copies / mL). A negative result must be combined with clinical  observations, patient history, and epidemiological information. If result is POSITIVE SARS-CoV-2 target nucleic acids are DETECTED. The SARS-CoV-2 RNA is generally detectable in upper and lower  respiratory specimens dur ing the acute phase of infection.  Positive  results are indicative of active infection with SARS-CoV-2.  Clinical  correlation with patient history and other diagnostic information is  necessary to determine patient infection status.  Positive results do  not rule out bacterial infection or co-infection with other viruses. If result is PRESUMPTIVE POSTIVE SARS-CoV-2 nucleic acids MAY BE PRESENT.   A presumptive positive result was obtained on the submitted specimen  and confirmed on repeat testing.  While 2019 novel coronavirus  (SARS-CoV-2) nucleic acids may be present in the submitted sample  additional confirmatory testing may be necessary for epidemiological  and / or clinical management purposes  to differentiate between  SARS-CoV-2 and other Sarbecovirus  currently known to infect humans.  If clinically indicated additional testing with an alternate test  methodology [phone]) is advised. The SARS-CoV-2 RNA is generally  detectable in upper and lower respiratory sp ecimens during the acute  phase of infection. The expected result is Negative. Fact Sheet for Patients:  StrictlyIdeas.no Fact Sheet for Healthcare Providers: BankingDealers.co.za This test is  not yet approved or cleared by the [place] FDA and has been authorized for detection and/or diagnosis of SARS-CoV-2 by FDA under an Emergency Use Authorization (EUA).  This EUA will remain in effect (meaning this test can be used) for the duration of the COVID-19 declaration under Section 564(b)(1) of the Act, 21 U.S.C. section 360bbb-3(b)(1), unless the authorization is terminated or revoked sooner. Performed at [facility], [place] [address] [place], [place] [address]    Mr Angio Head Wo Contrast  Result Date: 05/12/2019 CLINICAL DATA:  RIGHT-sided weakness and slurred speech. EXAM: MRI HEAD WITHOUT CONTRAST MRA HEAD WITHOUT CONTRAST TECHNIQUE: Multiplanar, multiecho pulse sequences of the brain and surrounding structures were obtained without intravenous contrast. Angiographic images of the head were obtained using MRA technique without contrast. COMPARISON:  CT head earlier today. MR head 12/31/2018. MRI cervical spine described separately. FINDINGS: MRI HEAD FINDINGS Brain: There is a cluster of punctate foci, restricted diffusion, LEFT paramedian pons, corresponding low ADC, consistent with acute infarction. No hemorrhage, mass lesion, or extra-axial fluid. Advanced atrophy, not unexpected for age, with hydrocephalus ex vacuo. Mild T2 and FLAIR hyperintensities in the white matter, likely small vessel disease. Vascular: Reported separately. Skull and upper cervical spine: Normal marrow signal. There is extreme pannus surrounding the odontoid, with severe cervicomedullary compression. Sinuses/Orbits: No acute findings.  BILATERAL cataract extraction. Other: No mastoid fluid. MRA HEAD FINDINGS RIGHT ICA: Tandem 50% stenoses in the cavernous segment. Cervical and petrous segments widely patent. Mild narrowing of the supraclinoid ICA, with widely patent terminus. LEFT ICA: 50% stenosis of the cavernous/supraclinoid ICA junction. No flow reducing stenoses more distally. ICA terminus widely  patent. RIGHT MCA: 50-75% stenosis in its mid M1 segment. The MCA bifurcation demonstrates mild to moderate disease affecting the M2 segments and beyond. LEFT MCA: Critical stenosis proximal M1 segment, 5 mm segment of diminished to absent flow related enhancement. Poor flow related enhancement of MCA vessels distally particularly the LEFT M2 inferior division. LEFT ACA: Dominant segment. Disc is rise to both distal anterior cerebral arteries RIGHT ACA: Atretic or occluded. Basilar artery: Multiple segmental stenoses, at least two of which are significantly flow reducing. 75% stenosis in the proximal basilar. 75-90% stenosis in the distal basilar. 50-75% stenosis in the mid basilar. LEFT vertebral: Widely patent. RIGHT vertebral: Severely diseased, occluded at skull base. High-grade stenosis distal V4 segment, likely retrograde filling. Posterior cerebral arteries: Severe BILATERAL flow-limiting stenoses, 75-90% in the P1-P2 junctions. Beyond this, BILATERAL segmental PCA stenoses likely flow reducing, greater on the RIGHT. Cerebellar branches: Poorly seen. IMPRESSION: Acute nonhemorrhagic infarction affecting the LEFT paramedian pons. Advanced atrophy and mild small vessel disease. Widespread intracranial atherosclerotic disease, with moderate to severe flow-limiting tandem stenoses, most notable in the basilar artery. High-grade stenosis of the LEFT M1 MCA is also of concern. See discussion above. Electronically Signed   By: Staci Righter M.D.   On: 05/12/2019 15:36   Mr Brain Wo Contrast  Result Date: 05/12/2019 CLINICAL DATA:  RIGHT-sided weakness and slurred speech. EXAM: MRI HEAD WITHOUT CONTRAST MRA HEAD WITHOUT CONTRAST TECHNIQUE: Multiplanar, multiecho pulse sequences of the brain and surrounding structures  were obtained without intravenous contrast. Angiographic images of the head were obtained using MRA technique without contrast. COMPARISON:  CT head earlier today. MR head 12/31/2018. MRI cervical  spine described separately. FINDINGS: MRI HEAD FINDINGS Brain: There is a cluster of punctate foci, restricted diffusion, LEFT paramedian pons, corresponding low ADC, consistent with acute infarction. No hemorrhage, mass lesion, or extra-axial fluid. Advanced atrophy, not unexpected for age, with hydrocephalus ex vacuo. Mild T2 and FLAIR hyperintensities in the white matter, likely small vessel disease. Vascular: Reported separately. Skull and upper cervical spine: Normal marrow signal. There is extreme pannus surrounding the odontoid, with severe cervicomedullary compression. Sinuses/Orbits: No acute findings.  BILATERAL cataract extraction. Other: No mastoid fluid. MRA HEAD FINDINGS RIGHT ICA: Tandem 50% stenoses in the cavernous segment. Cervical and petrous segments widely patent. Mild narrowing of the supraclinoid ICA, with widely patent terminus. LEFT ICA: 50% stenosis of the cavernous/supraclinoid ICA junction. No flow reducing stenoses more distally. ICA terminus widely patent. RIGHT MCA: 50-75% stenosis in its mid M1 segment. The MCA bifurcation demonstrates mild to moderate disease affecting the M2 segments and beyond. LEFT MCA: Critical stenosis proximal M1 segment, 5 mm segment of diminished to absent flow related enhancement. Poor flow related enhancement of MCA vessels distally particularly the LEFT M2 inferior division. LEFT ACA: Dominant segment. Disc is rise to both distal anterior cerebral arteries RIGHT ACA: Atretic or occluded. Basilar artery: Multiple segmental stenoses, at least two of which are significantly flow reducing. 75% stenosis in the proximal basilar. 75-90% stenosis in the distal basilar. 50-75% stenosis in the mid basilar. LEFT vertebral: Widely patent. RIGHT vertebral: Severely diseased, occluded at skull base. High-grade stenosis distal V4 segment, likely retrograde filling. Posterior cerebral arteries: Severe BILATERAL flow-limiting stenoses, 75-90% in the P1-P2 junctions. Beyond  this, BILATERAL segmental PCA stenoses likely flow reducing, greater on the RIGHT. Cerebellar branches: Poorly seen. IMPRESSION: Acute nonhemorrhagic infarction affecting the LEFT paramedian pons. Advanced atrophy and mild small vessel disease. Widespread intracranial atherosclerotic disease, with moderate to severe flow-limiting tandem stenoses, most notable in the basilar artery. High-grade stenosis of the LEFT M1 MCA is also of concern. See discussion above. Electronically Signed   By: Staci Righter M.D.   On: 05/12/2019 15:36   Mr Cervical Spine Wo Contrast  Result Date: 05/12/2019 CLINICAL DATA:  C-spine trauma, myelopathy. EXAM: MRI CERVICAL SPINE WITHOUT CONTRAST TECHNIQUE: Multiplanar, multisequence MR imaging of the cervical spine was performed. No intravenous contrast was administered. COMPARISON:  Partial visualization of cervicomedullary junction on prior MR brain studies. FINDINGS: Alignment: 6 mm anterolisthesis C7 on T1. There is 3 mm degenerative anterolisthesis C2-C3. Otherwise anatomic alignment. Vertebrae: Significant erosion of C2 due to pannus. Cord: Severe cord compression opposite C2, related to pannus. Abnormal cord signal is identified along the lower margin of the cord compression, see series 6, image 9. Posterior Fossa, vertebral arteries, paraspinal tissues: No tonsillar herniation. LEFT vertebral artery is dominant. RIGHT vertebral is occluded. Disc levels: C1-C2: Severe pannus, projects posteriorly resulting in cord compression as well as erodes the odontoid. Pannus measures up to 13 mm in anterior-posterior extent. Canal diameter measures 4 mm at its most narrow point. C2-3: 3 mm facet mediated anterolisthesis. No stenosis. BILATERAL C3 foraminal narrowing. C3-4: Central protrusion with osseous spurring. Facet arthropathy. BILATERAL C4 foraminal narrowing. C4-5: Central protrusion with osseous spurring. Facet arthropathy. BILATERAL C5 foraminal narrowing. C5-6: Central protrusion  with osseous spurring. Facet arthropathy. BILATERAL C6 foraminal narrowing. C6-7: Central protrusion with osseous spurring. RIGHT greater than LEFT C7  foraminal narrowing. C7-T1: 6 mm anterolisthesis. Advanced disc space narrowing. Facet arthropathy. BILATERAL C8 foraminal narrowing. 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 Abnormal cord signal is identified. Surgical consultation is warranted. Multilevel spondylosis resulting in foraminal narrowing from C2-3 through C7-T1. See discussion above. Electronically Signed   By: Staci Righter M.D.   On: 05/12/2019 15:46   Ct Head Code Stroke Wo Contrast  Result Date: 05/12/2019 CLINICAL DATA:  Code stroke. Left-sided weakness slurred speech. LV0 positive EXAM: CT HEAD WITHOUT CONTRAST TECHNIQUE: Contiguous axial images were obtained from the base of the skull through the vertex without intravenous contrast. COMPARISON:  MRI head 12/31/2018 FINDINGS: Brain: Moderate atrophy with ventricular enlargement which is stable from prior studies. Negative for acute infarct.  Negative for acute hemorrhage or mass. Vascular: Negative for hyperdense vessel Skull: No skull lesions. Extensive calcified pannus posterior to the dens causing spinal stenosis as noted on prior MRI. Sinuses/Orbits: Mild mucosal edema paranasal sinuses. Bilateral cataract surgery. Other: None ASPECTS ([place] Stroke Program Early CT Score) - Ganglionic level infarction (caudate, lentiform nuclei, internal capsule, insula, M1-M3 cortex): 7 - Supraganglionic infarction (M4-M6 cortex): 3 Total score (0-10 with 10 being normal): 10 IMPRESSION: 1. No acute intracranial abnormality 2. Atrophy and mild chronic microvascular ischemia 3. ASPECTS is 10 4. Severe pannus posterior to the dens causing spinal stenosis and cord compression on the right as noted on prior MRI 5. These results were called by telephone at the time of  interpretation on 05/12/2019 at 11:10 am to Dr. Rory Percy, who verbally acknowledged these results. Electronically Signed   By: Franchot Gallo M.D.   On: 05/12/2019 11:13    Pending Labs Unresulted Labs (From admission, onward)    Start     Ordered   05/19/19 0500  Creatinine, serum  (enoxaparin (LOVENOX)    CrCl >/= 30 ml/min)  Weekly,   R    Comments: while on enoxaparin therapy    05/12/19 1437   05/13/19 0500  Lipid panel  Tomorrow morning,   R     05/12/19 1118   05/13/19 0500  Hemoglobin A1c  Tomorrow morning,   R     05/12/19 1118          Vitals/Pain Today's Vitals   05/12/19 1158 05/12/19 1200 05/12/19 1400 05/12/19 1641  BP:  (!) 161/99    Pulse:   88 89  Resp:   20   Temp: 97.7 F (36.5 C)     TempSrc: Oral     SpO2:   95% 100%  PainSc:        Isolation Precautions No active isolations  Medications Medications  sodium chloride flush (NS) 0.9 % injection 3 mL (has no administration in time range)  acetaminophen (TYLENOL) tablet 650 mg (has no administration in time range)  aspirin EC tablet 81 mg (has no administration in time range)  traMADol (ULTRAM) tablet 50 mg (has no administration in time range)  nitroGLYCERIN (NITROSTAT) SL tablet 0.4 mg (has no administration in time range)  levothyroxine (SYNTHROID) tablet 50 mcg (has no administration in time range)  meclizine (ANTIVERT) tablet 12.5 mg (has no administration in time range)  pantoprazole (PROTONIX) EC tablet 40 mg (has no administration in time range)  clopidogrel (PLAVIX) tablet 75 mg (has no administration in time range)  multivitamin with minerals tablet 1 tablet (has no administration in time range)  albuterol (VENTOLIN HFA) 108 (90 Base) MCG/ACT inhaler  2 puff (has no administration in time range)  latanoprost (XALATAN) 0.005 % ophthalmic solution 1 drop (has no administration in time range)   stroke: mapping our early stages of recovery book (has no administration in time range)  senna-docusate  (Senokot-S) tablet 1 tablet (has no administration in time range)  enoxaparin (LOVENOX) injection 40 mg (has no administration in time range)  atorvastatin (LIPITOR) tablet 40 mg (has no administration in time range)  hydrALAZINE (APRESOLINE) injection 10 mg (has no administration in time range)    Mobility walks with device High fall risk   Focused Assessments Neuro Assessment Handoff:  Swallow screen pass? Yes    NIH Stroke Scale ( + Modified Stroke Scale Criteria)  Interval: Initial Level of Consciousness (1a.)   : Alert, keenly responsive LOC Questions (1b. )   +: Answers both questions correctly LOC Commands (1c. )   + : Performs both tasks correctly Best Gaze (2. )  +: Normal Visual (3. )  +: No visual loss Facial Palsy (4. )    : Normal symmetrical movements Motor Arm, Left (5a. )   +: No drift Motor Arm, Right (5b. )   +: No drift Motor Leg, Left (6a. )   +: No drift Motor Leg, Right (6b. )   +: No drift Limb Ataxia (7. ): Absent Sensory (8. )   +: Normal, no sensory loss Best Language (9. )   +: No aphasia Dysarthria (10. ): Mild-to-moderate dysarthria, patient slurs at least some words and, at worst, can be understood with some difficulty Extinction/Inattention (11.)   +: No Abnormality Modified SS Total  +: 0 Complete NIHSS TOTAL: 2 Last date known well: 05/12/19 Last time known well: 0230 Neuro Assessment: Within Defined Limits Neuro Checks:   Initial (05/12/19 1044)  Last Documented NIHSS Modified Score: 0 (05/12/19 1546) Has TPA been given? No If patient is a Neuro Trauma and patient is going to OR before floor call report to [facility] nurse: [phone] or [phone]     R Recommendations: See Admitting Provider Note  Report given to:   Additional Notes:

## 2019-05-12 NOTE — H&P (Addendum)
History and Physical    DOA: 05/12/2019  PCP: [name], MD  Patient coming from: Home  Chief Complaint: Right-sided weakness that started last night  HPI: Robert Rowe is a very interesting 85 y.o. 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  Patient states he went to bed feeling okay but woke up in the middle of night with restlessness, wanted to use the bathroom and noticed that he could not move his right side well.  He also reports feeling numb in the right side of his face.  He states his family noted slurred speech this morning and he was brought into the ED as code stroke.  He still feels somewhat weak in his right side especially the right upper extremity.  He states his speech is better but not back to his baseline. He denies any tingling or numbness.  Patient states at baseline he is on Plavix daily but he started taking a baby aspirin about 2 weeks back (does not recollect the indication).  He noticed ecchymosis/bruising along the right knee/thigh area and got concerned.  He tells me he stopped taking both aspirin and Plavix 3 days back.  He quit smoking many years back, drinks whiskey occasionally and lives with his 51 year old wife.  Work-up in the ED revealed unremarkable findings on CT head.  Patient was evaluated by neurology, patient not felt to be a candidate for TPA and MRI/MRA of the head were ordered.  Patient is requested to be admitted for further evaluation and management.  Review of Systems: As per HPI otherwise 10 point review of systems negative.    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 Echo 12/16: mild LVH, EF 55-60%, no RWMA, Gr 1 DD, mild AI, MAC, mild LAE, normal RVF, PASP 37 mmHg   Chronic kidney disease    chronic    DJD (degenerative joint disease)    Dysrhythmia    pvc's    GERD 12/03/2007   Hiatal hernia    History of nuclear stress test    Myoview 1/17: EF 53%, normal perfusion; Low Risk   HYPERLIPIDEMIA 04/05/2007   Hypothyroidism    Osteoarth NOS-Unspec 04/05/2007   Palpitations 02/12/2010   PEDAL EDEMA 05/01/2008   Rheumatoid arthritis(714.0) 04/05/2007   Shortness of breath dyspnea    TRANSIENT ISCHEMIC ATTACK, HX OF 04/05/2007   ?     Past Surgical History:  Procedure Laterality Date   BALLOON DILATION N/A 03/12/2013   Procedure: BALLOON DILATION;  Surgeon: Inda Castle, MD;  Location: [facility];  Service: Endoscopy;  Laterality: N/A;   CARPAL TUNNEL RELEASE  11/28/2018   Left hand   CATARACT EXTRACTION  2001   bilateral   CYSTOSCOPY WITH URETHRAL DILATATION N/A 09/24/2015   Procedure: CYSTOSCOPY WITH COOK BALLOON DILATATION OF URETHRAL STRICTURE ;  Surgeon: Carolan Clines, MD;  Location: [facility];  Service: Urology;  Laterality: N/A;   CYSTOSCOPY WITH URETHRAL DILATATION N/A 07/20/2016   Procedure: CYSTOSCOPY WITH URETHRAL DILATATION;  Surgeon: Alexis Frock, MD;  Location: [facility];  Service: Urology;  Laterality: N/A;  1 HOUR   ESOPHAGOGASTRODUODENOSCOPY N/A 03/12/2013   Procedure: ESOPHAGOGASTRODUODENOSCOPY (EGD);  Surgeon: Inda Castle, MD;  Location: [facility];  Service: Endoscopy;  Laterality: N/A;   HEMORRHOID SURGERY     HERNIA REPAIR     inguinal x5   KNEE SURGERY  arthroscopic   left   PROSTATE CRYOABLATION     2000   SHOULDER SURGERY  few years ago   left   TRANSURETHRAL RESECTION OF PROSTATE N/A 03/19/2015   Procedure: TRANSURETHRAL RESECTION OF THE PROSTATE (TURP);  Surgeon:  Carolan Clines, MD;  Location: [facility];  Service: Urology;  Laterality: N/A;    Social history:  reports that he quit smoking about 65 years ago. His smoking use included cigarettes. He has never used smokeless tobacco. He reports current alcohol use. He reports that he does not use drugs.   Allergies  Allergen Reactions   Codeine Other (See Comments)    Patient was "all over the place"- undesired side effect   Sulfasalazine Other (See Comments)    Reaction not recalled   Sulfonamide Derivatives Other (See Comments)    Reaction not recalled    Family History  Problem Relation Age of Onset   Breast cancer Mother    Heart disease Father    Heart attack Father    Lymphoma Brother    Stroke Neg Hx    Hypertension Neg Hx       Prior to Admission medications   Medication Sig Start Date End Date Taking? Authorizing Provider  acetaminophen (TYLENOL) 325 MG tablet Take 2 tablets (650 mg total) by mouth every 6 (six) hours as needed for mild pain (or Fever >/= 101). 01/10/19  Yes Angiulli, Lavon Paganini, PA-C  aspirin EC 81 MG tablet Take 81 mg by mouth daily.   Yes [provider]  levothyroxine (SYNTHROID, LEVOTHROID) 50 MCG tablet Take 50 mcg by mouth daily before breakfast. For Hypothyroidism   Yes [provider]  albuterol (PROVENTIL HFA;VENTOLIN HFA) 108 (90 Base) MCG/ACT inhaler Inhale 2 puffs into the lungs every 4 (four) hours as needed for wheezing or shortness of breath. 11/02/18   Laurey Morale, MD  clopidogrel (PLAVIX) 75 MG tablet Take 1 tablet (75 mg total) by mouth daily. 01/10/19   Angiulli, Lavon Paganini, PA-C  furosemide (LASIX) 20 MG tablet TAKE 1 TABLET BY MOUTH  DAILY AS NEEDED FOR FLUID  OR EDEMA. 07/02/18   Hilty, Nadean Corwin, MD  latanoprost (XALATAN) 0.005 % ophthalmic solution Place 1 drop into both eyes at bedtime. For Glaucoma 02/08/18   [provider]  magnesium hydroxide (MILK OF MAGNESIA) 400 MG/5ML suspension Constipation (1 of 4  ) If no BM in 3 days, give 30 cc Milk of Magnesium p.o. x 1 dose in 24 hours as needed ( Do  Not use standing constipation orders for residents with renal failure CFR less than 30. Contact MD for orders)    [provider]  meclizine (ANTIVERT) 12.5 MG tablet Take 1 tablet (12.5 mg total) by mouth 3 (three) times daily as needed for dizziness. 01/10/19   Angiulli, Lavon Paganini, PA-C  metoprolol succinate (TOPROL-XL) 25 MG 24 hr tablet Take 1 tablet (25 mg total) by mouth at bedtime. 01/10/19   Angiulli, Lavon Paganini, PA-C  Multiple Vitamin (MULTIVITAMIN WITH MINERALS) TABS tablet Take 1 tablet by mouth 2 (two) times a week.     [provider]  nitroGLYCERIN (NITROSTAT) 0.4 MG  SL tablet Place 0.4 mg under the tongue every 5 (five) minutes as needed (for Esophageal spasms).     [provider]  pantoprazole (PROTONIX) 40 MG tablet Take 1 tablet (40 mg total) by mouth daily. 01/10/19   Angiulli, Lavon Paganini, PA-C  traMADol (ULTRAM) 50 MG tablet Take 1 tablet (50 mg total) by mouth every 6 (six) hours as needed. 02/15/19   Burchette, Alinda Sierras, MD  triamcinolone cream (KENALOG) 0.1 % Apply to affected rash bid prn. 03/15/19   [name], MD    Physical Exam: Vitals:   05/12/19 1130 05/12/19 1158 05/12/19 1200 05/12/19 1400  BP: (!) 159/98  (!) 161/99   Pulse: 83   88  Resp: 18   20  Temp:  97.7 F (36.5 C)    TempSrc:  Oral    SpO2: 95%   95%    Constitutional: NAD, calm, comfortable Eyes: No ptosis, chronic left eye vision impairment ENMT: Mucous membranes are moist. Posterior pharynx clear of any exudate or lesions.Normal dentition.  Neck: normal, supple, no masses, no thyromegaly Respiratory: clear to auscultation bilaterally, no wheezing, no crackles. Normal respiratory effort. No accessory muscle use.  Cardiovascular: Regular rate and rhythm, no murmurs / rubs / gallops. No extremity edema. 2+ pedal pulses. No carotid bruits.  Abdomen: no tenderness, no masses palpated.  No hepatosplenomegaly. Bowel sounds positive.  Musculoskeletal: Old surgical scar along the right lower leg from old trauma, also has mild atrophy in this extremity compared to left. Good ROM, no contractures.  No clubbing / cyanosis.  Neurologic: Mild facial droop along the right side, no tongue deviation.  No ptosis.  Able to frown. Sensations intact, strength 4/5 in right upper extremity and right lower extremity compared to 5/5 on the left side Psychiatric: Normal judgment and insight. Alert and oriented x 3. Normal mood.  SKIN/catheters:  Ecchymosis noted in bilateral lower extremities right greater than left.   Labs on Admission: I have personally reviewed following labs and imaging studies  CBC: Recent Labs  Lab 05/12/19 1032 05/12/19 1100  WBC 7.7  --   NEUTROABS 4.7  --   HGB 14.1 14.6  HCT 44.0 43.0  MCV 98.7  --   PLT 232  --    Basic Metabolic Panel: Recent Labs  Lab 05/12/19 1032 05/12/19 1100  NA 138 139  K 4.3 4.3  CL 104 105  CO2 24  --   GLUCOSE 132* 124*  BUN 19 20  CREATININE 1.57* 1.60*  CALCIUM 9.6  --    GFR: CrCl cannot be calculated (Unknown ideal weight.). Liver Function Tests: Recent Labs  Lab 05/12/19 1032  AST 15  ALT 11  ALKPHOS 79  BILITOT 0.7  PROT 6.1*  ALBUMIN 3.6   No results for input(s): LIPASE, AMYLASE in the last 168 hours. No results for input(s): AMMONIA in the last 168 hours. Coagulation Profile: Recent Labs  Lab 05/12/19 1032  INR 1.1   Cardiac Enzymes: No results for input(s): CKTOTAL, CKMB, CKMBINDEX, TROPONINI in the last 168 hours. BNP (last 3 results) No results for input(s): PROBNP in the last 8760 hours. HbA1C: No results for input(s): HGBA1C in the last 72 hours. CBG: Recent Labs  Lab 05/12/19 1049  GLUCAP 114*   Lipid Profile: No results for input(s): CHOL, HDL, LDLCALC, TRIG, CHOLHDL, LDLDIRECT in the last 72 hours. Thyroid Function Tests: No results for input(s): TSH, T4TOTAL, FREET4, T3FREE,  THYROIDAB in the last 72 hours. Anemia Panel: No results  for input(s): VITAMINB12, FOLATE, FERRITIN, TIBC, IRON, RETICCTPCT in the last 72 hours. Urine analysis:    Component Value Date/Time   COLORURINE YELLOW 01/13/2019 0027   APPEARANCEUR CLEAR 01/13/2019 0027   LABSPEC 1.025 01/13/2019 0027   PHURINE 6.0 01/13/2019 0027   GLUCOSEU NEGATIVE 01/13/2019 0027   HGBUR NEGATIVE 01/13/2019 0027   BILIRUBINUR NEGATIVE 01/13/2019 0027   KETONESUR NEGATIVE 01/13/2019 0027   PROTEINUR NEGATIVE 01/13/2019 0027   UROBILINOGEN 0.2 08/27/2015 1930   NITRITE NEGATIVE 01/13/2019 0027   LEUKOCYTESUR NEGATIVE 01/13/2019 0027    Radiological Exams on Admission: Mr Angio Head Wo Contrast  Result Date: 05/12/2019 CLINICAL DATA:  RIGHT-sided weakness and slurred speech. EXAM: MRI HEAD WITHOUT CONTRAST MRA HEAD WITHOUT CONTRAST TECHNIQUE: Multiplanar, multiecho pulse sequences of the brain and surrounding structures were obtained without intravenous contrast. Angiographic images of the head were obtained using MRA technique without contrast. COMPARISON:  CT head earlier today. MR head 12/31/2018. MRI cervical spine described separately. FINDINGS: MRI HEAD FINDINGS Brain: There is a cluster of punctate foci, restricted diffusion, LEFT paramedian pons, corresponding low ADC, consistent with acute infarction. No hemorrhage, mass lesion, or extra-axial fluid. Advanced atrophy, not unexpected for age, with hydrocephalus ex vacuo. Mild T2 and FLAIR hyperintensities in the white matter, likely small vessel disease. Vascular: Reported separately. Skull and upper cervical spine: Normal marrow signal. There is extreme pannus surrounding the odontoid, with severe cervicomedullary compression. Sinuses/Orbits: No acute findings.  BILATERAL cataract extraction. Other: No mastoid fluid. MRA HEAD FINDINGS RIGHT ICA: Tandem 50% stenoses in the cavernous segment. Cervical and petrous segments widely patent. Mild narrowing of the  supraclinoid ICA, with widely patent terminus. LEFT ICA: 50% stenosis of the cavernous/supraclinoid ICA junction. No flow reducing stenoses more distally. ICA terminus widely patent. RIGHT MCA: 50-75% stenosis in its mid M1 segment. The MCA bifurcation demonstrates mild to moderate disease affecting the M2 segments and beyond. LEFT MCA: Critical stenosis proximal M1 segment, 5 mm segment of diminished to absent flow related enhancement. Poor flow related enhancement of MCA vessels distally particularly the LEFT M2 inferior division. LEFT ACA: Dominant segment. Disc is rise to both distal anterior cerebral arteries RIGHT ACA: Atretic or occluded. Basilar artery: Multiple segmental stenoses, at least two of which are significantly flow reducing. 75% stenosis in the proximal basilar. 75-90% stenosis in the distal basilar. 50-75% stenosis in the mid basilar. LEFT vertebral: Widely patent. RIGHT vertebral: Severely diseased, occluded at skull base. High-grade stenosis distal V4 segment, likely retrograde filling. Posterior cerebral arteries: Severe BILATERAL flow-limiting stenoses, 75-90% in the P1-P2 junctions. Beyond this, BILATERAL segmental PCA stenoses likely flow reducing, greater on the RIGHT. Cerebellar branches: Poorly seen. IMPRESSION: Acute nonhemorrhagic infarction affecting the LEFT paramedian pons. Advanced atrophy and mild small vessel disease. Widespread intracranial atherosclerotic disease, with moderate to severe flow-limiting tandem stenoses, most notable in the basilar artery. High-grade stenosis of the LEFT M1 MCA is also of concern. See discussion above. Electronically Signed   By: Staci Righter M.D.   On: 05/12/2019 15:36   Mr Brain Wo Contrast  Result Date: 05/12/2019 CLINICAL DATA:  RIGHT-sided weakness and slurred speech. EXAM: MRI HEAD WITHOUT CONTRAST MRA HEAD WITHOUT CONTRAST TECHNIQUE: Multiplanar, multiecho pulse sequences of the brain and surrounding structures were obtained without  intravenous contrast. Angiographic images of the head were obtained using MRA technique without contrast. COMPARISON:  CT head earlier today. MR head 12/31/2018. MRI cervical spine described separately. FINDINGS: MRI HEAD FINDINGS Brain: There is a cluster of punctate foci,  restricted diffusion, LEFT paramedian pons, corresponding low ADC, consistent with acute infarction. No hemorrhage, mass lesion, or extra-axial fluid. Advanced atrophy, not unexpected for age, with hydrocephalus ex vacuo. Mild T2 and FLAIR hyperintensities in the white matter, likely small vessel disease. Vascular: Reported separately. Skull and upper cervical spine: Normal marrow signal. There is extreme pannus surrounding the odontoid, with severe cervicomedullary compression. Sinuses/Orbits: No acute findings.  BILATERAL cataract extraction. Other: No mastoid fluid. MRA HEAD FINDINGS RIGHT ICA: Tandem 50% stenoses in the cavernous segment. Cervical and petrous segments widely patent. Mild narrowing of the supraclinoid ICA, with widely patent terminus. LEFT ICA: 50% stenosis of the cavernous/supraclinoid ICA junction. No flow reducing stenoses more distally. ICA terminus widely patent. RIGHT MCA: 50-75% stenosis in its mid M1 segment. The MCA bifurcation demonstrates mild to moderate disease affecting the M2 segments and beyond. LEFT MCA: Critical stenosis proximal M1 segment, 5 mm segment of diminished to absent flow related enhancement. Poor flow related enhancement of MCA vessels distally particularly the LEFT M2 inferior division. LEFT ACA: Dominant segment. Disc is rise to both distal anterior cerebral arteries RIGHT ACA: Atretic or occluded. Basilar artery: Multiple segmental stenoses, at least two of which are significantly flow reducing. 75% stenosis in the proximal basilar. 75-90% stenosis in the distal basilar. 50-75% stenosis in the mid basilar. LEFT vertebral: Widely patent. RIGHT vertebral: Severely diseased, occluded at skull  base. High-grade stenosis distal V4 segment, likely retrograde filling. Posterior cerebral arteries: Severe BILATERAL flow-limiting stenoses, 75-90% in the P1-P2 junctions. Beyond this, BILATERAL segmental PCA stenoses likely flow reducing, greater on the RIGHT. Cerebellar branches: Poorly seen. IMPRESSION: Acute nonhemorrhagic infarction affecting the LEFT paramedian pons. Advanced atrophy and mild small vessel disease. Widespread intracranial atherosclerotic disease, with moderate to severe flow-limiting tandem stenoses, most notable in the basilar artery. High-grade stenosis of the LEFT M1 MCA is also of concern. See discussion above. Electronically Signed   By: Staci Righter M.D.   On: 05/12/2019 15:36   Mr Cervical Spine Wo Contrast  Result Date: 05/12/2019 CLINICAL DATA:  C-spine trauma, myelopathy. EXAM: MRI CERVICAL SPINE WITHOUT CONTRAST TECHNIQUE: Multiplanar, multisequence MR imaging of the cervical spine was performed. No intravenous contrast was administered. COMPARISON:  Partial visualization of cervicomedullary junction on prior MR brain studies. FINDINGS: Alignment: 6 mm anterolisthesis C7 on T1. There is 3 mm degenerative anterolisthesis C2-C3. Otherwise anatomic alignment. Vertebrae: Significant erosion of C2 due to pannus. Cord: Severe cord compression opposite C2, related to pannus. Abnormal cord signal is identified along the lower margin of the cord compression, see series 6, image 9. Posterior Fossa, vertebral arteries, paraspinal tissues: No tonsillar herniation. LEFT vertebral artery is dominant. RIGHT vertebral is occluded. Disc levels: C1-C2: Severe pannus, projects posteriorly resulting in cord compression as well as erodes the odontoid. Pannus measures up to 13 mm in anterior-posterior extent. Canal diameter measures 4 mm at its most narrow point. C2-3: 3 mm facet mediated anterolisthesis. No stenosis. BILATERAL C3 foraminal narrowing. C3-4: Central protrusion with osseous spurring.  Facet arthropathy. BILATERAL C4 foraminal narrowing. C4-5: Central protrusion with osseous spurring. Facet arthropathy. BILATERAL C5 foraminal narrowing. C5-6: Central protrusion with osseous spurring. Facet arthropathy. BILATERAL C6 foraminal narrowing. C6-7: Central protrusion with osseous spurring. RIGHT greater than LEFT C7 foraminal narrowing. C7-T1: 6 mm anterolisthesis. Advanced disc space narrowing. Facet arthropathy. BILATERAL C8 foraminal narrowing. 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 Abnormal cord signal is identified. Surgical consultation is warranted.  Multilevel spondylosis resulting in foraminal narrowing from C2-3 through C7-T1. See discussion above. Electronically Signed   By: Staci Righter M.D.   On: 05/12/2019 15:46   Ct Head Code Stroke Wo Contrast  Result Date: 05/12/2019 CLINICAL DATA:  Code stroke. Left-sided weakness slurred speech. LV0 positive EXAM: CT HEAD WITHOUT CONTRAST TECHNIQUE: Contiguous axial images were obtained from the base of the skull through the vertex without intravenous contrast. COMPARISON:  MRI head 12/31/2018 FINDINGS: Brain: Moderate atrophy with ventricular enlargement which is stable from prior studies. Negative for acute infarct.  Negative for acute hemorrhage or mass. Vascular: Negative for hyperdense vessel Skull: No skull lesions. Extensive calcified pannus posterior to the dens causing spinal stenosis as noted on prior MRI. Sinuses/Orbits: Mild mucosal edema paranasal sinuses. Bilateral cataract surgery. Other: None ASPECTS ([place] Stroke Program Early CT Score) - Ganglionic level infarction (caudate, lentiform nuclei, internal capsule, insula, M1-M3 cortex): 7 - Supraganglionic infarction (M4-M6 cortex): 3 Total score (0-10 with 10 being normal): 10 IMPRESSION: 1. No acute intracranial abnormality 2. Atrophy and mild chronic microvascular ischemia 3. ASPECTS is 10 4.  Severe pannus posterior to the dens causing spinal stenosis and cord compression on the right as noted on prior MRI 5. These results were called by telephone at the time of interpretation on 05/12/2019 at 11:10 am to Dr. Rory Percy, who verbally acknowledged these results. Electronically Signed   By: Franchot Gallo M.D.   On: 05/12/2019 11:13    EKG: Independently reviewed.  Normal sinus rhythm, artifact.  No acute ST-T changes     Assessment and Plan:   1.  Acute ischemic left pontine stroke: MRI brain reveals acute stroke in the left paramedian pons.  MRA shows widespread atherosclerotic intracranial disease as described above with high-grade stenosis of left MCA as well as moderate to severe basilar stenosis. Patient stopped taking aspirin and Plavix 3 days back.  Will resume.  Will add statins.Hold metoprolol for permissive hypertension given high-grade stenosis. Lipid profile/ hemoglobin A1c in a.m. Neurology evaluated patient in the ED.Echocardiogram ordered.  Patient did have an echo in April showing preserved EF.  Continue close monitoring overnight with neuro checks.  PT/OT/speech therapy consulted.    2.  Chronic diastolic CHF: Appears compensated.  Hold metoprolol for permissive hypertension.  Takes Lasix as needed at home.  Can be ordered if needed as inpatient.  3.  CKD stage III: Creatinine stable at 1.6.  4.  Hypothyroidism: Resume Synthroid  5.  Rheumatoid arthritis: ?Currently not on any home medications.  DVT prophylaxis: Lovenox  Code Status: Full code  Family Communication: Discussed with patient. Health care proxy would be his wife Consults called: Neurology Admission status: I certify that at the point of admission it is my clinical judgment that the patient will require inpatient hospital care spanning beyond 2 midnights from the point of admission due to high intensity of service and high frequency of surveillance required.Inpatient status is judged to be reasonable and  necessary in order to provide the required intensity of service to ensure the patient's safety. The patient's presenting symptoms, physical exam findings, and initial radiographic and laboratory data in the context of their chronic comorbidities is felt to place them at high risk for further clinical deterioration. The following factors support the patient status of inpatient.   Acute CVA with residual right-sided weakness  Guilford Shi MD [facility] Pager [phone]  If 7PM-7AM, please contact night-coverage [email or web address] Password [email or web address]  05/12/2019, 3:56 PM

## 2019-05-12 NOTE — ED Provider Notes (Signed)
[facility] EMERGENCY DEPARTMENT Provider Note   CSN: [id number] Arrival date & time: 05/12/19  1043  An emergency department physician performed an initial assessment on this suspected stroke patient at 1047.  History   Chief Complaint Chief Complaint  Patient presents with   Code Stroke    HPI Robert Rowe is a 85 y.o. male.     HPI  Robert Rowe is an 85 y.o. 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 He says he got up at 0215 to go to bathroom feeling normal. Then when he awoke for the day ~10am he felt like he had right side weakness and family noted slurred speech. EMS was called and he was brought to ER as a Code Stroke. His speech had resolved by the time he arrived and there was only slight right arm weakness noted. 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 Echo 12/16: mild LVH, EF 55-60%, no RWMA, Gr 1 DD, mild AI, MAC, mild LAE, normal RVF, PASP 37 mmHg   Chronic kidney disease    chronic    DJD (degenerative joint disease)    Dysrhythmia    pvc's    GERD 12/03/2007   Hiatal hernia    History of nuclear stress test    Myoview 1/17: EF 53%, normal perfusion; Low Risk   HYPERLIPIDEMIA 04/05/2007   Hypothyroidism    Osteoarth NOS-Unspec 04/05/2007   Palpitations 02/12/2010   PEDAL EDEMA 05/01/2008   Rheumatoid arthritis(714.0) 04/05/2007   Shortness of breath dyspnea    TRANSIENT ISCHEMIC ATTACK, HX OF 04/05/2007   ?     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   Gait disturbance, post-stroke    BPV (benign positional  vertigo) 01/02/2019   History of stroke 01/01/2019   TIA (transient ischemic attack) 12/31/2018   CVA (cerebral vascular accident) ([place]) 12/31/2018   Chest pain 05/15/2018   CKD (chronic kidney disease) stage 3, GFR 30-59 ml/min (HCC) 05/08/2018   Other fatigue 06/13/2017   Abnormal diffusion capacity determined by pulmonary function test 06/13/2017   Chronic diastolic CHF (congestive heart failure) ([place]) 11/01/2016   Essential hypertension 11/01/2016   Intracranial vascular stenosis 11/09/2015   Mild cognitive impairment 11/09/2015   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   Benign prostatic hyperplasia with urinary obstruction 03/19/2015   Awareness alteration, transient 02/26/2015   PVC's (premature ventricular contractions) 08/22/2013   Hypothyroidism 05/09/2013   Stricture and stenosis of esophagus 03/12/2013   Dysphagia, unspecified(787.20) 02/22/2013   DOE (dyspnea on exertion) 03/23/2011   PALPITATIONS 02/12/2010   CHEST DISCOMFORT 04/25/2009   PEDAL EDEMA 05/01/2008   GERD 12/03/2007   Dyslipidemia 04/05/2007   BENIGN PROSTATIC HYPERTROPHY 04/05/2007   Rheumatoid arthritis ([place]) 04/05/2007   Osteoarthritis 04/05/2007   History of cardiovascular disorder 04/05/2007    Past Surgical History:  Procedure Laterality Date   BALLOON DILATION N/A 03/12/2013   Procedure: BALLOON DILATION;  Surgeon: Inda Castle, MD;  Location: [facility];  Service: Endoscopy;  Laterality: N/A;   CARPAL TUNNEL RELEASE  11/28/2018   Left hand   CATARACT EXTRACTION  2001   bilateral   CYSTOSCOPY WITH URETHRAL DILATATION N/A 09/24/2015   Procedure: CYSTOSCOPY WITH COOK BALLOON DILATATION OF URETHRAL STRICTURE ;  Surgeon: Carolan Clines, MD;  Location: [facility];  Service: Urology;  Laterality: N/A;   CYSTOSCOPY WITH URETHRAL DILATATION N/A 07/20/2016   Procedure: CYSTOSCOPY WITH URETHRAL DILATATION;  Surgeon: Alexis Frock, MD;  Location: [facility];  Service:  Urology;  Laterality: N/A;  1 HOUR   ESOPHAGOGASTRODUODENOSCOPY N/A 03/12/2013   Procedure: ESOPHAGOGASTRODUODENOSCOPY (EGD);  Surgeon: Inda Castle, MD;  Location: [facility];  Service: Endoscopy;  Laterality: N/A;   HEMORRHOID SURGERY     HERNIA REPAIR     inguinal x5   KNEE SURGERY  arthroscopic   left   PROSTATE CRYOABLATION     2000   SHOULDER SURGERY  few years ago   left   TRANSURETHRAL RESECTION OF PROSTATE N/A 03/19/2015   Procedure: TRANSURETHRAL RESECTION OF THE PROSTATE (TURP);  Surgeon: Carolan Clines, MD;  Location: [facility];  Service: Urology;  Laterality: N/A;        Home Medications    Prior to Admission medications   Medication Sig Start Date End Date Taking? Authorizing Provider  acetaminophen (TYLENOL) 325 MG tablet Take 2 tablets (650 mg total) by mouth every 6 (six) hours as needed for mild pain (or Fever >/= 101). 01/10/19   Angiulli, Lavon Paganini, PA-C  albuterol (PROVENTIL HFA;VENTOLIN HFA) 108 (90 Base) MCG/ACT inhaler Inhale 2 puffs into the lungs every 4 (four) hours as needed for wheezing or shortness of breath. 11/02/18   Laurey Morale, MD  clopidogrel (PLAVIX) 75 MG tablet Take 1 tablet (75 mg total) by mouth daily. 01/10/19   Angiulli, Lavon Paganini, PA-C  furosemide (LASIX) 20 MG tablet TAKE 1 TABLET BY MOUTH  DAILY AS NEEDED FOR FLUID  OR EDEMA. 07/02/18   Hilty, Nadean Corwin, MD  latanoprost (XALATAN) 0.005 % ophthalmic solution Place 1 drop into both eyes at bedtime. For Glaucoma 02/08/18   [provider]  levothyroxine (SYNTHROID, LEVOTHROID) 50 MCG tablet Take 50 mcg by mouth daily before breakfast. For Hypothyroidism    [provider]  magnesium hydroxide (MILK OF MAGNESIA) 400 MG/5ML suspension Constipation (1 of 4 ) If no BM in 3 days, give 30 cc Milk of Magnesium p.o. x 1 dose in 24 hours as needed ( Do  Not use standing constipation orders for residents with renal failure CFR less than 30. Contact MD for orders)    [provider]  meclizine (ANTIVERT) 12.5 MG tablet Take 1 tablet (12.5 mg total) by mouth 3 (three) times daily as needed for dizziness. 01/10/19   Angiulli, Lavon Paganini, PA-C  metoprolol succinate (TOPROL-XL) 25 MG 24 hr tablet Take 1 tablet (25 mg total) by mouth at bedtime. 01/10/19   Angiulli, Lavon Paganini, PA-C  Multiple Vitamin (MULTIVITAMIN WITH MINERALS) TABS tablet Take 1 tablet by mouth 2 (two) times a week.     [provider]  nitroGLYCERIN (NITROSTAT) 0.4 MG SL tablet Place 0.4 mg under the tongue every 5 (five) minutes as needed (for Esophageal spasms).     [provider]  pantoprazole (PROTONIX) 40 MG tablet Take 1 tablet (40 mg total) by mouth daily. 01/10/19   Angiulli, Lavon Paganini, PA-C  traMADol (ULTRAM) 50 MG tablet Take 1 tablet (50 mg total) by mouth every 6 (six) hours as needed. 02/15/19   Burchette, Alinda Sierras, MD  triamcinolone cream (KENALOG) 0.1 % Apply to affected rash bid prn.  03/15/19   Burchette, Alinda Sierras, MD    Family History Family History  Problem Relation Age of Onset   Breast cancer Mother    Heart disease Father    Heart attack Father    Lymphoma Brother    Stroke Neg Hx    Hypertension Neg Hx     Social History Social History   Tobacco Use   Smoking status: Former Smoker    Types: Cigarettes    Quit date: 08/13/1953    Years since quitting: 65.7   Smokeless tobacco: Never Used   Tobacco comment: 1942-1954 up to 1 pack/day  Substance Use Topics   Alcohol use: Yes    Comment: occ vodka   Drug use: No     Allergies   Codeine, Sulfasalazine, and Sulfonamide derivatives   Review of Systems Review of Systems 10 Systems reviewed and are negative for acute change except as noted in the HPI.   Physical Exam Updated Vital Signs BP (!) 159/98    Pulse 83    Temp 97.7 F (36.5 C) (Oral)    Resp 18    SpO2 95%   Physical Exam Constitutional:      Appearance: He is well-developed.     Comments: Patient is in excellent  condition for age.  Alert and interactive.  No respiratory distress.  HENT:     Head: Normocephalic and atraumatic.     Mouth/Throat:     Mouth: Mucous membranes are moist.     Pharynx: Oropharynx is clear.  Eyes:     Extraocular Movements: Extraocular movements intact.     Pupils: Pupils are equal, round, and reactive to light.  Neck:     Musculoskeletal: Neck supple.  Cardiovascular:     Rate and Rhythm: Normal rate and regular rhythm.     Heart sounds: Murmur present.  Pulmonary:     Effort: Pulmonary effort is normal.     Breath sounds: Normal breath sounds.  Abdominal:     General: Bowel sounds are normal. There is no distension.     Palpations: Abdomen is soft.     Tenderness: There is no abdominal tenderness.  Musculoskeletal: Normal range of motion.     Comments: 1+ bilateral edema.  Skin:    General: Skin is warm and dry.  Neurological:     General: No focal deficit present.     Mental Status: He is alert and oriented to person, place, and time.     GCS: GCS eye subscore is 4. GCS verbal subscore is 5. GCS motor subscore is 6.     Cranial Nerves: No cranial nerve deficit.     Coordination: Coordination normal.  Psychiatric:        Mood and Affect: Mood normal.      ED Treatments / Results  Labs (all labs ordered are listed, but only abnormal results are displayed) Labs Reviewed  COMPREHENSIVE METABOLIC PANEL - Abnormal; Notable for the following components:      Result Value   Glucose, Bld 132 (*)    Creatinine, Ser 1.57 (*)    Total Protein 6.1 (*)    GFR calc non Af Amer 36 (*)    GFR calc Af Amer 42 (*)    All other components within normal limits  I-STAT CHEM 8, ED - Abnormal; Notable for the following components:   Creatinine, Ser 1.60 (*)    Glucose, Bld 124 (*)    All other components within normal limits  CBG MONITORING, ED -  Abnormal; Notable for the following components:   Glucose-Capillary 114 (*)    All other components within normal limits   SARS CORONAVIRUS 2 (HOSPITAL ORDER, [facility])  PROTIME-INR  APTT  CBC  DIFFERENTIAL    EKG EKG Interpretation  Date/Time:  Sunday May 12 2019 11:07:50 EDT Ventricular Rate:  86 PR Interval:    QRS Duration: 77 QT Interval:  391 QTC Calculation: 468 R Axis:   45 Text Interpretation:  Sinus rhythm normal except artifact, no change Confirmed by Charlesetta Shanks [phone]) on 05/12/2019 12:14:56 PM   Radiology Ct Head Code Stroke Wo Contrast  Result Date: 05/12/2019 CLINICAL DATA:  Code stroke. Left-sided weakness slurred speech. LV0 positive EXAM: CT HEAD WITHOUT CONTRAST TECHNIQUE: Contiguous axial images were obtained from the base of the skull through the vertex without intravenous contrast. COMPARISON:  MRI head 12/31/2018 FINDINGS: Brain: Moderate atrophy with ventricular enlargement which is stable from prior studies. Negative for acute infarct.  Negative for acute hemorrhage or mass. Vascular: Negative for hyperdense vessel Skull: No skull lesions. Extensive calcified pannus posterior to the dens causing spinal stenosis as noted on prior MRI. Sinuses/Orbits: Mild mucosal edema paranasal sinuses. Bilateral cataract surgery. Other: None ASPECTS ([place] Stroke Program Early CT Score) - Ganglionic level infarction (caudate, lentiform nuclei, internal capsule, insula, M1-M3 cortex): 7 - Supraganglionic infarction (M4-M6 cortex): 3 Total score (0-10 with 10 being normal): 10 IMPRESSION: 1. No acute intracranial abnormality 2. Atrophy and mild chronic microvascular ischemia 3. ASPECTS is 10 4. Severe pannus posterior to the dens causing spinal stenosis and cord compression on the right as noted on prior MRI 5. These results were called by telephone at the time of interpretation on 05/12/2019 at 11:10 am to Dr. Rory Percy, who verbally acknowledged these results. Electronically Signed   By: Franchot Gallo M.D.   On: 05/12/2019 11:13    Procedures Procedures (including  critical care time)  Medications Ordered in ED Medications  sodium chloride flush (NS) 0.9 % injection 3 mL (has no administration in time range)     Initial Impression / Assessment and Plan / ED Course  I have reviewed the triage vital signs and the nursing notes.  Pertinent labs & imaging results that were available during my care of the patient were reviewed by me and considered in my medical decision making (see chart for details).  Clinical Course as of May 11 1304  Sun May 12, 2019  1302 Have updated and had conversations with both the patient's wife, Reggy Eye and his son [name].  Patient is now agreeable to an observation.  And further diagnostic evaluation.  Under no circumstances would he go to a rehab facility.  Plan when completion of diagnostic evaluation is for discharge to home.   [MP]    Clinical Course User Index [MP] Charlesetta Shanks, MD      Patient symptoms have resolved.  Neurology has assessed and recommend MRI and observation for stroke\TIA.  I have reviewed this with the patient's family and the patient.  Initially patient wished to go home however is now agreeable to proceed with observation and further diagnostic evaluation.  At this time patient is stable with clear mental status and intact neurologic exam.  Final Clinical Impressions(s) / ED Diagnoses   Final diagnoses:  TIA (transient ischemic attack)    ED Discharge Orders    None       Charlesetta Shanks, MD 05/12/19 1540

## 2019-05-12 NOTE — ED Notes (Signed)
ED Provider at bedside. 

## 2019-05-13 ENCOUNTER — Encounter (HOSPITAL_COMMUNITY): Payer: Self-pay | Admitting: Radiology

## 2019-05-13 ENCOUNTER — Inpatient Hospital Stay (HOSPITAL_COMMUNITY): Payer: Medicare Other

## 2019-05-13 DIAGNOSIS — E785 Hyperlipidemia, unspecified: Secondary | ICD-10-CM

## 2019-05-13 DIAGNOSIS — I340 Nonrheumatic mitral (valve) insufficiency: Secondary | ICD-10-CM

## 2019-05-13 DIAGNOSIS — I6300 Cerebral infarction due to thrombosis of unspecified precerebral artery: Secondary | ICD-10-CM

## 2019-05-13 DIAGNOSIS — I63512 Cerebral infarction due to unspecified occlusion or stenosis of left middle cerebral artery: Secondary | ICD-10-CM

## 2019-05-13 DIAGNOSIS — I5032 Chronic diastolic (congestive) heart failure: Secondary | ICD-10-CM

## 2019-05-13 DIAGNOSIS — R1311 Dysphagia, oral phase: Secondary | ICD-10-CM

## 2019-05-13 LAB — LIPID PANEL
Cholesterol: 203 mg/dL — ABNORMAL HIGH (ref 0–200)
HDL: 29 mg/dL — ABNORMAL LOW (ref >40)
LDL Cholesterol: 129 mg/dL — ABNORMAL HIGH (ref 0–99)
Total CHOL/HDL Ratio: 7 RATIO
Triglycerides: 226 mg/dL — ABNORMAL HIGH (ref <150)
VLDL: 45 mg/dL — ABNORMAL HIGH (ref 0–40)

## 2019-05-13 LAB — GLUCOSE, CAPILLARY
Glucose-Capillary: 97 mg/dL (ref 70–99)
Glucose-Capillary: 90 mg/dL (ref 70–99)

## 2019-05-13 LAB — ECHOCARDIOGRAM COMPLETE
Height: 62 in
Weight: 2359.8 oz

## 2019-05-13 MED ORDER — SODIUM CHLORIDE 0.9 % IV SOLN
INTRAVENOUS | Status: DC
  Administered 2019-05-13 – 2019-05-14 (×6): via INTRAVENOUS

## 2019-05-13 MED ORDER — HYDROCORTISONE 1 % EX CREA
TOPICAL_CREAM | Freq: Two times a day (BID) | CUTANEOUS | Status: AC
  Administered 2019-05-13 – 2019-05-16 (×6): via TOPICAL
  Filled 2019-05-13: qty 28

## 2019-05-13 MED ORDER — DIPHENHYDRAMINE HCL 50 MG/ML IJ SOLN
25.0000 mg | Freq: Once | INTRAMUSCULAR | Status: AC
  Administered 2019-05-13: 18:00:00 25 mg via INTRAVENOUS
  Filled 2019-05-13: qty 1

## 2019-05-13 MED ORDER — SODIUM CHLORIDE 0.9 % IV BOLUS
1000.0000 mL | Freq: Once | INTRAVENOUS | Status: AC
  Administered 2019-05-13: 1000 mL via INTRAVENOUS

## 2019-05-13 MED ORDER — IOHEXOL 350 MG/ML SOLN
75.0000 mL | Freq: Once | INTRAVENOUS | Status: AC | PRN
  Administered 2019-05-13: 75 mL via INTRAVENOUS

## 2019-05-13 NOTE — Progress Notes (Signed)
STROKE TEAM PROGRESS NOTE   INTERVAL HISTORY I have personally reviewed history of presenting illness with the patient as well as imaging films in PACS.  He presented with slurred speech and right-sided weakness which persist.  MRI scan shows left paramedian pontine lacunar infarct.  CT angiogram showed multifocal diffuse large vessel intracranial atherosclerosis.  LDL cholesterol is elevated at 129 mg percent.  Echocardiogram is pending.  Vitals:   05/13/19 0816 05/13/19 1040 05/13/19 1139 05/13/19 1200  BP: (!) 153/93 (!) 155/87 (!) 142/90 (!) 161/95  Pulse: 91 88 89 85  Resp: 18     Temp: (!) 97.5 F (36.4 C) 98.6 F (37 C) (!) 97.5 F (36.4 C) 98.3 F (36.8 C)  TempSrc: Oral Oral Oral Axillary  SpO2: 94% 94% 96% 96%  Weight:      Height:        CBC:  Recent Labs  Lab 05/12/19 1032 05/12/19 1100  WBC 7.7  --   NEUTROABS 4.7  --   HGB 14.1 14.6  HCT 44.0 43.0  MCV 98.7  --   PLT 232  --     Basic Metabolic Panel:  Recent Labs  Lab 05/12/19 1032 05/12/19 1100  NA 138 139  K 4.3 4.3  CL 104 105  CO2 24  --   GLUCOSE 132* 124*  BUN 19 20  CREATININE 1.57* 1.60*  CALCIUM 9.6  --    Lipid Panel:     Component Value Date/Time   CHOL 203 (H) 05/13/2019 0632   TRIG 226 (H) 05/13/2019 0632   HDL 29 (L) 05/13/2019 0632   CHOLHDL 7.0 05/13/2019 0632   VLDL 45 (H) 05/13/2019 0632   LDLCALC 129 (H) 05/13/2019 6160   HgbA1c:  Lab Results  Component Value Date   HGBA1C 6.2 (H) 01/01/2019   Urine Drug Screen: No results found for: LABOPIA, COCAINSCRNUR, LABBENZ, AMPHETMU, THCU, LABBARB  Alcohol Level No results found for: ETH  IMAGING Ct Angio Head W Or Wo Contrast  Result Date: 05/13/2019 CLINICAL DATA:  Right-sided weakness EXAM: CT ANGIOGRAPHY HEAD AND NECK TECHNIQUE: Multidetector CT imaging of the head and neck was performed using the standard protocol during bolus administration of intravenous contrast. Multiplanar CT image reconstructions and MIPs were  obtained to evaluate the vascular anatomy. Carotid stenosis measurements (when applicable) are obtained utilizing NASCET criteria, using the distal internal carotid diameter as the denominator. CONTRAST:  79mL OMNIPAQUE IOHEXOL 350 MG/ML SOLN COMPARISON:  Head CT 05/12/2019 FINDINGS: CTA NECK FINDINGS SKELETON: Large pannus at C2 that causes spinal canal stenosis and compression of the spinal cord as demonstrated on previous MRI. OTHER NECK: Normal pharynx, larynx and major salivary glands. No cervical lymphadenopathy. Unremarkable thyroid gland. UPPER CHEST: No pneumothorax or pleural effusion. No nodules or masses. AORTIC ARCH: There is mild calcific atherosclerosis of the aortic arch. There is no aneurysm, dissection or hemodynamically significant stenosis of the visualized ascending aorta and aortic arch. Conventional 3 vessel aortic branching pattern. The visualized proximal subclavian arteries are widely patent. RIGHT CAROTID SYSTEM: --Common carotid artery: There is noncalcified plaque within the right common carotid artery with less than 50% stenosis. --Internal carotid artery: No dissection, occlusion or aneurysm. There is mixed density atherosclerosis extending into the proximal ICA, resulting in 50% stenosis. --External carotid artery: No acute abnormality. LEFT CAROTID SYSTEM: --Common carotid artery: Widely patent origin without common carotid artery dissection or aneurysm. --Internal carotid artery: No dissection, occlusion or aneurysm. Mild atherosclerotic calcification at the carotid bifurcation without hemodynamically  significant stenosis. --External carotid artery: No acute abnormality. VERTEBRAL ARTERIES: Left dominant configuration. Right vertebral artery origin is occluded, but most of the V1 segment is opacified. There is moderate stenosis of the left vertebral artery origin. The left V1-V3 segments are normal. The right vertebral artery is diffusely diminutive with long segment loss of  opacification beginning at the C5 level. CTA HEAD FINDINGS POSTERIOR CIRCULATION: --Vertebral arteries: Severe stenosis of the left V4 segment due to predominantly calcified plaque. The right V4 segment is diminutive and only intermittently opacified. --Posterior inferior cerebellar arteries (PICA): Patent origins from the vertebral arteries. The right PICA may be supplied via collateral flow from the left side. --Anterior inferior cerebellar arteries (AICA): Not clearly visualize --Basilar artery: Severe stenosis of the midportion of the basilar artery (12:24) --Superior cerebellar arteries: Patent proximally --Posterior cerebral arteries (PCA): Posterior communicating arteries are diminutive or absent. There is multifocal severe stenosis of the right PCA, which remains patent. Stenosis of the left PCA are greatest at the proximal P2 segment. ANTERIOR CIRCULATION: --Intracranial internal carotid arteries: Atherosclerotic calcification of the internal carotid arteries at the skull base without hemodynamically significant stenosis. --Anterior cerebral arteries (ACA): Normal. Absent right A1 segment, normal variant --Middle cerebral arteries (MCA): There is focal hypoattenuation of the proximal left M1 segment, likely indicating nonocclusive thrombus (series 8, image 107, series 11, image 20). The inferior M2 division of the left MCA is occluded beginning at the MCA bifurcation (series 10, images 140 3-147), extending for a length of approximately 7 mm. The distal branches of the left MCA are patent. VENOUS SINUSES: As permitted by contrast timing, patent. ANATOMIC VARIANTS: None Review of the MIP images confirms the above findings. IMPRESSION: 1. Nonocclusive thrombus within the proximal M1 segment of the left middle cerebral artery, with occlusion of the inferior division left M2 segment beginning at the MCA bifurcation and extending over length of approximately 7 mm. Compared to the MRI/MRA of 12/31/2018, these  findings are unchanged. These results were called by telephone at the time of interpretation on 05/13/2019 at 2:40 am to Dr. Kerney Elbe , who verbally acknowledged these results. 2. 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 3. Severe stenosis of the basilar artery and multifocal bilateral posterior cerebral artery severe stenoses, also unchanged. 4. Approximately 50% stenosis of the proximal right internal carotid artery which is otherwise normal. 5. Large amount of pannus dorsal to the odontoid process with spinal canal stenosis, better characterized on recent MRI. Electronically Signed   By: Ulyses Jarred M.D.   On: 05/13/2019 02:43   Ct Angio Neck W Or Wo Contrast  Result Date: 05/13/2019 CLINICAL DATA:  Right-sided weakness EXAM: CT ANGIOGRAPHY HEAD AND NECK TECHNIQUE: Multidetector CT imaging of the head and neck was performed using the standard protocol during bolus administration of intravenous contrast. Multiplanar CT image reconstructions and MIPs were obtained to evaluate the vascular anatomy. Carotid stenosis measurements (when applicable) are obtained utilizing NASCET criteria, using the distal internal carotid diameter as the denominator. CONTRAST:  46mL OMNIPAQUE IOHEXOL 350 MG/ML SOLN COMPARISON:  Head CT 05/12/2019 FINDINGS: CTA NECK FINDINGS SKELETON: Large pannus at C2 that causes spinal canal stenosis and compression of the spinal cord as demonstrated on previous MRI. OTHER NECK: Normal pharynx, larynx and major salivary glands. No cervical lymphadenopathy. Unremarkable thyroid gland. UPPER CHEST: No pneumothorax or pleural effusion. No nodules or masses. AORTIC ARCH: There is mild calcific atherosclerosis of the aortic  arch. There is no aneurysm, dissection or hemodynamically significant stenosis of the visualized ascending aorta and aortic arch. Conventional 3 vessel aortic branching pattern. The visualized  proximal subclavian arteries are widely patent. RIGHT CAROTID SYSTEM: --Common carotid artery: There is noncalcified plaque within the right common carotid artery with less than 50% stenosis. --Internal carotid artery: No dissection, occlusion or aneurysm. There is mixed density atherosclerosis extending into the proximal ICA, resulting in 50% stenosis. --External carotid artery: No acute abnormality. LEFT CAROTID SYSTEM: --Common carotid artery: Widely patent origin without common carotid artery dissection or aneurysm. --Internal carotid artery: No dissection, occlusion or aneurysm. Mild atherosclerotic calcification at the carotid bifurcation without hemodynamically significant stenosis. --External carotid artery: No acute abnormality. VERTEBRAL ARTERIES: Left dominant configuration. Right vertebral artery origin is occluded, but most of the V1 segment is opacified. There is moderate stenosis of the left vertebral artery origin. The left V1-V3 segments are normal. The right vertebral artery is diffusely diminutive with long segment loss of opacification beginning at the C5 level. CTA HEAD FINDINGS POSTERIOR CIRCULATION: --Vertebral arteries: Severe stenosis of the left V4 segment due to predominantly calcified plaque. The right V4 segment is diminutive and only intermittently opacified. --Posterior inferior cerebellar arteries (PICA): Patent origins from the vertebral arteries. The right PICA may be supplied via collateral flow from the left side. --Anterior inferior cerebellar arteries (AICA): Not clearly visualize --Basilar artery: Severe stenosis of the midportion of the basilar artery (12:24) --Superior cerebellar arteries: Patent proximally --Posterior cerebral arteries (PCA): Posterior communicating arteries are diminutive or absent. There is multifocal severe stenosis of the right PCA, which remains patent. Stenosis of the left PCA are greatest at the proximal P2 segment. ANTERIOR CIRCULATION:  --Intracranial internal carotid arteries: Atherosclerotic calcification of the internal carotid arteries at the skull base without hemodynamically significant stenosis. --Anterior cerebral arteries (ACA): Normal. Absent right A1 segment, normal variant --Middle cerebral arteries (MCA): There is focal hypoattenuation of the proximal left M1 segment, likely indicating nonocclusive thrombus (series 8, image 107, series 11, image 20). The inferior M2 division of the left MCA is occluded beginning at the MCA bifurcation (series 10, images 140 3-147), extending for a length of approximately 7 mm. The distal branches of the left MCA are patent. VENOUS SINUSES: As permitted by contrast timing, patent. ANATOMIC VARIANTS: None Review of the MIP images confirms the above findings. IMPRESSION: 1. Nonocclusive thrombus within the proximal M1 segment of the left middle cerebral artery, with occlusion of the inferior division left M2 segment beginning at the MCA bifurcation and extending over length of approximately 7 mm. Compared to the MRI/MRA of 12/31/2018, these findings are unchanged. These results were called by telephone at the time of interpretation on 05/13/2019 at 2:40 am to Dr. Kerney Elbe , who verbally acknowledged these results. 2. 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 3. Severe stenosis of the basilar artery and multifocal bilateral posterior cerebral artery severe stenoses, also unchanged. 4. Approximately 50% stenosis of the proximal right internal carotid artery which is otherwise normal. 5. Large amount of pannus dorsal to the odontoid process with spinal canal stenosis, better characterized on recent MRI. Electronically Signed   By: Ulyses Jarred M.D.   On: 05/13/2019 02:43   Mr Angio Head Wo Contrast  Result Date: 05/12/2019 CLINICAL DATA:  RIGHT-sided weakness and slurred speech. EXAM: MRI HEAD WITHOUT CONTRAST MRA HEAD  WITHOUT CONTRAST TECHNIQUE: Multiplanar, multiecho pulse sequences of the brain  and surrounding structures were obtained without intravenous contrast. Angiographic images of the head were obtained using MRA technique without contrast. COMPARISON:  CT head earlier today. MR head 12/31/2018. MRI cervical spine described separately. FINDINGS: MRI HEAD FINDINGS Brain: There is a cluster of punctate foci, restricted diffusion, LEFT paramedian pons, corresponding low ADC, consistent with acute infarction. No hemorrhage, mass lesion, or extra-axial fluid. Advanced atrophy, not unexpected for age, with hydrocephalus ex vacuo. Mild T2 and FLAIR hyperintensities in the white matter, likely small vessel disease. Vascular: Reported separately. Skull and upper cervical spine: Normal marrow signal. There is extreme pannus surrounding the odontoid, with severe cervicomedullary compression. Sinuses/Orbits: No acute findings.  BILATERAL cataract extraction. Other: No mastoid fluid. MRA HEAD FINDINGS RIGHT ICA: Tandem 50% stenoses in the cavernous segment. Cervical and petrous segments widely patent. Mild narrowing of the supraclinoid ICA, with widely patent terminus. LEFT ICA: 50% stenosis of the cavernous/supraclinoid ICA junction. No flow reducing stenoses more distally. ICA terminus widely patent. RIGHT MCA: 50-75% stenosis in its mid M1 segment. The MCA bifurcation demonstrates mild to moderate disease affecting the M2 segments and beyond. LEFT MCA: Critical stenosis proximal M1 segment, 5 mm segment of diminished to absent flow related enhancement. Poor flow related enhancement of MCA vessels distally particularly the LEFT M2 inferior division. LEFT ACA: Dominant segment. Disc is rise to both distal anterior cerebral arteries RIGHT ACA: Atretic or occluded. Basilar artery: Multiple segmental stenoses, at least two of which are significantly flow reducing. 75% stenosis in the proximal basilar. 75-90% stenosis in the distal  basilar. 50-75% stenosis in the mid basilar. LEFT vertebral: Widely patent. RIGHT vertebral: Severely diseased, occluded at skull base. High-grade stenosis distal V4 segment, likely retrograde filling. Posterior cerebral arteries: Severe BILATERAL flow-limiting stenoses, 75-90% in the P1-P2 junctions. Beyond this, BILATERAL segmental PCA stenoses likely flow reducing, greater on the RIGHT. Cerebellar branches: Poorly seen. IMPRESSION: Acute nonhemorrhagic infarction affecting the LEFT paramedian pons. Advanced atrophy and mild small vessel disease. Widespread intracranial atherosclerotic disease, with moderate to severe flow-limiting tandem stenoses, most notable in the basilar artery. High-grade stenosis of the LEFT M1 MCA is also of concern. See discussion above. Electronically Signed   By: Staci Righter M.D.   On: 05/12/2019 15:36   Mr Brain Wo Contrast  Result Date: 05/12/2019 CLINICAL DATA:  RIGHT-sided weakness and slurred speech. EXAM: MRI HEAD WITHOUT CONTRAST MRA HEAD WITHOUT CONTRAST TECHNIQUE: Multiplanar, multiecho pulse sequences of the brain and surrounding structures were obtained without intravenous contrast. Angiographic images of the head were obtained using MRA technique without contrast. COMPARISON:  CT head earlier today. MR head 12/31/2018. MRI cervical spine described separately. FINDINGS: MRI HEAD FINDINGS Brain: There is a cluster of punctate foci, restricted diffusion, LEFT paramedian pons, corresponding low ADC, consistent with acute infarction. No hemorrhage, mass lesion, or extra-axial fluid. Advanced atrophy, not unexpected for age, with hydrocephalus ex vacuo. Mild T2 and FLAIR hyperintensities in the white matter, likely small vessel disease. Vascular: Reported separately. Skull and upper cervical spine: Normal marrow signal. There is extreme pannus surrounding the odontoid, with severe cervicomedullary compression. Sinuses/Orbits: No acute findings.  BILATERAL cataract  extraction. Other: No mastoid fluid. MRA HEAD FINDINGS RIGHT ICA: Tandem 50% stenoses in the cavernous segment. Cervical and petrous segments widely patent. Mild narrowing of the supraclinoid ICA, with widely patent terminus. LEFT ICA: 50% stenosis of the cavernous/supraclinoid ICA junction. No flow reducing stenoses more distally. ICA terminus widely patent. RIGHT MCA: 50-75% stenosis in its mid M1 segment. The MCA  bifurcation demonstrates mild to moderate disease affecting the M2 segments and beyond. LEFT MCA: Critical stenosis proximal M1 segment, 5 mm segment of diminished to absent flow related enhancement. Poor flow related enhancement of MCA vessels distally particularly the LEFT M2 inferior division. LEFT ACA: Dominant segment. Disc is rise to both distal anterior cerebral arteries RIGHT ACA: Atretic or occluded. Basilar artery: Multiple segmental stenoses, at least two of which are significantly flow reducing. 75% stenosis in the proximal basilar. 75-90% stenosis in the distal basilar. 50-75% stenosis in the mid basilar. LEFT vertebral: Widely patent. RIGHT vertebral: Severely diseased, occluded at skull base. High-grade stenosis distal V4 segment, likely retrograde filling. Posterior cerebral arteries: Severe BILATERAL flow-limiting stenoses, 75-90% in the P1-P2 junctions. Beyond this, BILATERAL segmental PCA stenoses likely flow reducing, greater on the RIGHT. Cerebellar branches: Poorly seen. IMPRESSION: Acute nonhemorrhagic infarction affecting the LEFT paramedian pons. Advanced atrophy and mild small vessel disease. Widespread intracranial atherosclerotic disease, with moderate to severe flow-limiting tandem stenoses, most notable in the basilar artery. High-grade stenosis of the LEFT M1 MCA is also of concern. See discussion above. Electronically Signed   By: Staci Righter M.D.   On: 05/12/2019 15:36   Mr Cervical Spine Wo Contrast  Result Date: 05/12/2019 CLINICAL DATA:  C-spine trauma,  myelopathy. EXAM: MRI CERVICAL SPINE WITHOUT CONTRAST TECHNIQUE: Multiplanar, multisequence MR imaging of the cervical spine was performed. No intravenous contrast was administered. COMPARISON:  Partial visualization of cervicomedullary junction on prior MR brain studies. FINDINGS: Alignment: 6 mm anterolisthesis C7 on T1. There is 3 mm degenerative anterolisthesis C2-C3. Otherwise anatomic alignment. Vertebrae: Significant erosion of C2 due to pannus. Cord: Severe cord compression opposite C2, related to pannus. Abnormal cord signal is identified along the lower margin of the cord compression, see series 6, image 9. Posterior Fossa, vertebral arteries, paraspinal tissues: No tonsillar herniation. LEFT vertebral artery is dominant. RIGHT vertebral is occluded. Disc levels: C1-C2: Severe pannus, projects posteriorly resulting in cord compression as well as erodes the odontoid. Pannus measures up to 13 mm in anterior-posterior extent. Canal diameter measures 4 mm at its most narrow point. C2-3: 3 mm facet mediated anterolisthesis. No stenosis. BILATERAL C3 foraminal narrowing. C3-4: Central protrusion with osseous spurring. Facet arthropathy. BILATERAL C4 foraminal narrowing. C4-5: Central protrusion with osseous spurring. Facet arthropathy. BILATERAL C5 foraminal narrowing. C5-6: Central protrusion with osseous spurring. Facet arthropathy. BILATERAL C6 foraminal narrowing. C6-7: Central protrusion with osseous spurring. RIGHT greater than LEFT C7 foraminal narrowing. C7-T1: 6 mm anterolisthesis. Advanced disc space narrowing. Facet arthropathy. BILATERAL C8 foraminal narrowing. 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 Abnormal cord signal is identified. Surgical consultation is warranted. Multilevel spondylosis resulting in foraminal narrowing from C2-3 through C7-T1. See discussion above. Electronically Signed   By: Staci Righter M.D.    On: 05/12/2019 15:46   Ct Head Code Stroke Wo Contrast  Result Date: 05/12/2019 CLINICAL DATA:  Code stroke. Left-sided weakness slurred speech. LV0 positive EXAM: CT HEAD WITHOUT CONTRAST TECHNIQUE: Contiguous axial images were obtained from the base of the skull through the vertex without intravenous contrast. COMPARISON:  MRI head 12/31/2018 FINDINGS: Brain: Moderate atrophy with ventricular enlargement which is stable from prior studies. Negative for acute infarct.  Negative for acute hemorrhage or mass. Vascular: Negative for hyperdense vessel Skull: No skull lesions. Extensive calcified pannus posterior to the dens causing spinal stenosis as noted on prior MRI. Sinuses/Orbits: Mild mucosal edema paranasal sinuses. Bilateral cataract  surgery. Other: None ASPECTS ([place] Stroke Program Early CT Score) - Ganglionic level infarction (caudate, lentiform nuclei, internal capsule, insula, M1-M3 cortex): 7 - Supraganglionic infarction (M4-M6 cortex): 3 Total score (0-10 with 10 being normal): 10 IMPRESSION: 1. No acute intracranial abnormality 2. Atrophy and mild chronic microvascular ischemia 3. ASPECTS is 10 4. Severe pannus posterior to the dens causing spinal stenosis and cord compression on the right as noted on prior MRI 5. These results were called by telephone at the time of interpretation on 05/12/2019 at 11:10 am to Dr. Rory Percy, who verbally acknowledged these results. Electronically Signed   By: Franchot Gallo M.D.   On: 05/12/2019 11:13    PHYSICAL EXAM Pleasant frail elderly Caucasian male not in distress. . Afebrile. Head is nontraumatic. Neck is supple without bruit.    Cardiac exam no murmur or gallop. Lungs are clear to auscultation. Distal pulses are well felt. Neurological Exam ;  Awake  Alert oriented x 3.  Slightly dysarthric speech .mild decreased hearing bilaterally.  Eye movements full without nystagmus.fundi were not visualized. Vision acuity and fields appear normal.Palatal movements  are normal.  Mild right lower facial weakness.. Tongue midline. Normal strength, tone, reflexes and coordination except right upper extremity strength is 4/5 with weakness of right grip and intrinsic hand muscles..  Slightly impaired right finger-to-nose coordination.  Normal sensation. Gait deferred.  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 Following admission, had neuro worsening with minimal movement of RUE and new R facial weakness, improved with fluid bolus.   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 Small vessel disease. Atrophy. ASPECTS 10.   CT CS severe pannus posterior to the dens causing spinal stenosis and R cord compression  MRI  L paramedian pontine. Advanced small vessel disease. Atrophy.   MRA  Widespread intracranial atherosclerosis w/ mod to severe flow-limiting tandem stenoses, esp in the BA and L MCA M1  CTA head nonocclusive thrombus in L MCA M1 with occlusion of inferior division L M2 extending over 7 mm.  Essentially unchanged from previous MRI/MRA.  Occlusion of R V2 and V3, also unchanged.  Severe stenosis of BA & B PCAs.  Large amount of pannus dorsal to the odontoid process with spinal canal stenosis.  2D Echo normal EF  LDL 129  HgbA1c pending   Lovenox 30 mg sq daily for VTE prophylaxis  aspirin 81 mg daily and clopidogrel 75 mg daily prior to admission but stopped 3 days prior to admission given bruising along R knee/thigh, now on aspirin 81 mg daily and clopidogrel 75 mg daily. X 3 months and then aspirin alone  Therapy recommendations:  pending   Disposition:  pending   Follow up with Dr. Delice Lesch  Hypertension  Stable . Permissive hypertension (OK if < 220/120) but gradually normalize in  5-7 days . Long-term BP goal normotensive  Hyperlipidemia  Home meds:  No statin  Now on Lipitor 40  LDL 129, goal < 70  Has refused statins in the past, including during last admission in February  Continue statin at discharge  Dysphagia   Secondary to stroke   N.p.o.   Speech therapy on board    other Stroke Risk Factors  Advanced age  Former  Cigarette smoker, quit 65 yrs ago  ETOH use, advised to drink no more than 2 drink(s) a day  Hx stroke/TIA - followed by Dr. Delice Lesch ? 2013 neg MRI and EEG ? 01/2015 slumped to R. EEG normal. MRI punctate cortical infarct posterior L parietal love ? 09/2015 B leg weakness slurred speech. MRI sign IC ant and post circ ? 06/26/2016 loss of consciousness VB TIA ? 05/2018 memory loss, difficulty completing complex tasks c/w mild dementia ? 12/2018 - Incidental left occipital infarct secondary to large vessel disease source. Official dx BPPV as cause of dizziness  Chronic diastolic Congestive heart failure  Other Active Problems  Chronic OS blindness  RA  Myelopathy with acute C-spine abnormality, compression of cervical cord C2 -discussed during previous admission.  Can consider outpatient neurosurgical follow-up however patient is likely not a surgical candidate-age and refusal  Mild AKI on CKD stage III  GERD  Hypothyroidism  Hospital day # 1 I have personally obtained history,examined this patient, reviewed notes, independently viewed imaging studies, participated in medical decision making and plan of care.ROS completed by me personally and pertinent positives fully documented  I have made any additions or clarifications directly to the above note.  He presented with slurred speech and right arm weakness due to left pontine infarct from small vessel disease however he does have prior history of TIAs and multifocal severe large vessel intracranial atherosclerosis.  Recommend aspirin and Plavix for 3 months followed by aspirin  alone.  Aggressive risk factor modification.  Discussed with patient and Dr. Tana Coast.  Greater than 50% time during this 35-minute visit was spent on counseling and coordination of care about his lacunar stroke and intracranial atherosclerosis and discussion about evaluation and treatment plan and answering questions.  Antony Contras, MD Medical Director [facility] Pager: [phone] 05/13/2019 4:59 PM   To contact Stroke Continuity provider, please refer to [email or web address] After hours, contact General Neurology

## 2019-05-13 NOTE — Telephone Encounter (Signed)
Called patient and LMOVM to return call  [place] for [facility] to Discuss results / PCP / recommendations / Schedule patient  Per Dr. Elease Hashimoto: The physician who took over his practice is who I would start with (I can't remember his name)- but they would have all of his records.  CRM Created.

## 2019-05-13 NOTE — Evaluation (Signed)
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 He presented 6/21 with complaints of right-sided weakness and slurred speech beginning the night before.  MRI revealed acute nonhemorrhagic infarct of L paramedian pons. Pt with acute decompensation early morning 6/22 with increased R sided weakness. Follow-up MRI ordered.    Clinical Impression   Pt admitted with above and presents to OT with deficits impacting ability to complete ADLs at [facility].  Pt currently requires mod-max +2 for bed mobility and sit > stand.  Pt with decreased standing tolerance as needed for LB clothing management.  Pt demonstrating decreased functional use of RUE with impaired strength and motor control.  Pt could complete bathing/dressing at supervision level PTA with hired caregiver 3x/week to assist with shower.  Pt will benefit from OT acutely to address ADLs and functional mobility to prepare for d/c to venue of care below.      Follow Up Recommendations  SNF(vs CIR if pt shows improvement/family able to provide level of care)    Equipment Recommendations    TBD      Precautions / Restrictions Precautions Precautions: Fall Restrictions Weight Bearing Restrictions: No      Mobility Bed Mobility Overal bed mobility: Needs Assistance Bed Mobility: Rolling;Sidelying to Sit;Sit to Supine Rolling: Max assist Sidelying to sit: +2 for physical assistance;Max assist   Sit to supine: +2 for physical assistance;Total assist   General bed mobility comments: Max assist to +2 assist required for all aspects of bed mobility. Hand-over-hand assist to reach for the railing with LUE.   Transfers Overall  transfer level: Needs assistance Equipment used: 2 person hand held assist Transfers: Sit to/from Stand Sit to Stand: Max assist;+2 physical assistance         General transfer comment: Attempted sit<> stand x3. Pt required +2 assist and bed pad under hips to facilitate full stand. On second and third attempt, therapists scooted hips towards [place] in preparation for returning to supine. Pt not able to advance feet.     Balance Overall balance assessment: Needs assistance Sitting-balance support: Feet supported;Single extremity supported Sitting balance-Leahy Scale: Poor Sitting balance - Comments: assist required at EOB ranging from supervision to mod assist.  Postural control: Posterior lean Standing balance support: Bilateral upper extremity supported;During functional activity Standing balance-Leahy Scale: Zero Standing balance comment: +2 assist required.                            ADL either performed or assessed with clinical judgement   ADL Overall ADL's : Needs assistance/impaired     Grooming: Minimal assistance;Sitting   Upper Body Bathing: Minimal assistance;Sitting   Lower Body Bathing: +2 for physical assistance;Maximal assistance;Sitting/lateral leans;Sit to/from stand   Upper Body Dressing : Moderate assistance   Lower Body Dressing: +2 for physical assistance;Maximal assistance;Sitting/lateral leans;Sit to/from stand   Toilet Transfer: +2 for physical assistance;Stand-pivot;Maximal assistance           Functional mobility during ADLs: +2 for physical assistance                    Pertinent Vitals/Pain Pain Assessment: Faces Faces Pain Scale: No hurt  Hand Dominance Right   Extremity/Trunk Assessment Upper Extremity Assessment Upper Extremity Assessment: RUE deficits/detail RUE Deficits / Details: no active shoulder movement, able to complete elbow flexion to 90* extension to neutral, minimal wrist flexion and loose gross  grasp RUE Coordination: decreased fine motor;decreased gross motor   Lower Extremity Assessment Lower Extremity Assessment: RLE deficits/detail RLE Deficits / Details: Generalized weakness bilaterally with increased weakness and active movement noted in the RLE.    Cervical / Trunk Assessment Cervical / Trunk Assessment: Other exceptions Cervical / Trunk Exceptions: Forward head posture with rounded shoulders   Communication Communication Communication: HOH   Cognition Arousal/Alertness: Awake/alert Behavior During Therapy: WFL for tasks assessed/performed Overall Cognitive Status: No family/caregiver present to determine baseline cognitive functioning                                                Home Living Family/patient expects to be discharged to:: Private residence Living Arrangements: Spouse/significant other Available Help at Discharge: Family;Available 24 hours/day Type of Home: House Home Access: Stairs to enter [employer] of Steps: 8 Entrance Stairs-Rails: Right;Left Home Layout: Two level Alternate Level Stairs-Number of Steps: flight w/ chair lift   Bathroom Shower/Tub: Occupational psychologist: Standard Bathroom Accessibility: Yes   Home Equipment: Environmental consultant - 2 wheels;Tub bench          Prior Functioning/Environment Level of Independence: Needs assistance  Gait / Transfers Assistance Needed: RW ADL's / Homemaking Assistance Needed: Has an aide coming in 1 hour a day 3 days a week            OT Problem List: Decreased strength;Decreased range of motion;Decreased activity tolerance;Impaired balance (sitting and/or standing);Decreased coordination;Impaired sensation;Impaired UE functional use      OT Treatment/Interventions: Self-care/ADL training;Neuromuscular education;Patient/family education;Balance training    OT Goals(Current goals can be found in the care plan section) Acute Rehab OT Goals Patient Stated  Goal: to use Rt hand OT Goal Formulation: With patient Time For Goal Achievement: 05/27/19 Potential to Achieve Goals: Fair  OT Frequency: Min 3X/week           Co-evaluation PT/OT/SLP Co-Evaluation/Treatment: Yes Reason for Co-Treatment: Complexity of the patient's impairments (multi-system involvement);For patient/therapist safety;To address functional/ADL transfers PT goals addressed during session: Mobility/safety with mobility;Balance;Strengthening/ROM OT goals addressed during session: ADL's and self-care      AM-PAC OT "6 Clicks" Daily Activity     Outcome Measure Help from another person eating meals?: A Lot Help from another person taking care of personal grooming?: A Lot Help from another person toileting, which includes using toliet, bedpan, or urinal?: Total Help from another person bathing (including washing, rinsing, drying)?: Total Help from another person to put on and taking off regular upper body clothing?: A Lot Help from another person to put on and taking off regular lower body clothing?: Total 6 Click Score: 9   End of Session Equipment Utilized During Treatment: Gait belt Nurse Communication: Mobility status  Activity Tolerance:   Patient left: in bed;with call bell/phone within reach;with bed alarm set  OT Visit Diagnosis: Unsteadiness on feet (R26.81);Hemiplegia and hemiparesis Hemiplegia - Right/Left: Right Hemiplegia - dominant/non-dominant: Dominant                Time: 0940-1007 OT Time Calculation (min): 27 min Charges:  OT General Charges $OT Visit: 1 Visit OT Evaluation $OT  Eval Moderate Complexity: [place], [place], [phone] 05/13/2019, 1:25 PM

## 2019-05-13 NOTE — Consult Note (Signed)
Reason for Consult: C1/C2 pannus, cervical myelopathy Referring Physician: Dr. Dierdre Searles Robert Rowe is an 85 y.o. male.  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  Work-up included a brain MRI which demonstrated a pontine stroke with significant cerebrovascular disease and C1-2 pannus stenosis and spinal cord signal change.  A neurosurgical consultation was requested.  Presently the patient is alert and pleasant and has no complaints other than his right-sided weakness.  Past Medical History:  Diagnosis Date  . Acute cholecystitis 12/2018  . BENIGN PROSTATIC HYPERTROPHY 04/05/2007  . 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 CHEST DISCOMFORT 04/25/2009  . Chronic diastolic CHF (congestive heart failure) ([facility] [place])    a. Echo 12/16: mild LVH, EF 55-60%, no RWMA, Gr 1 DD, mild AI, MAC, mild LAE, normal RVF, PASP 37 mmHg  . Chronic kidney disease    chronic   . DJD (degenerative joint disease)   . Dysrhythmia    pvc's   . GERD 12/03/2007  . Hiatal hernia   . History of nuclear stress test    Myoview 1/17: EF 53%, normal perfusion; Low Risk  . HYPERLIPIDEMIA 04/05/2007  . Hypothyroidism   . Osteoarth NOS-Unspec 04/05/2007  . Palpitations 02/12/2010  . PEDAL EDEMA 05/01/2008  . Rheumatoid arthritis(714.0) 04/05/2007  . Shortness of breath dyspnea   . TRANSIENT ISCHEMIC ATTACK, HX OF 04/05/2007   ?     Past Surgical History:  Procedure Laterality Date  . BALLOON DILATION N/A 03/12/2013   Procedure: BALLOON DILATION;  Surgeon: Inda Castle, MD;  Location: [facility];  Service: Endoscopy;  Laterality: N/A;  . CARPAL TUNNEL RELEASE  11/28/2018   Left hand  . CATARACT EXTRACTION  2001   bilateral  . CYSTOSCOPY WITH URETHRAL DILATATION N/A 09/24/2015   Procedure: CYSTOSCOPY WITH COOK BALLOON DILATATION OF URETHRAL STRICTURE ;  Surgeon: Carolan Clines, MD;  Location: [facility];  Service: Urology;   Laterality: N/A;  . CYSTOSCOPY WITH URETHRAL DILATATION N/A 07/20/2016   Procedure: CYSTOSCOPY WITH URETHRAL DILATATION;  Surgeon: Alexis Frock, MD;  Location: [facility];  Service: Urology;  Laterality: N/A;  1 HOUR  . ESOPHAGOGASTRODUODENOSCOPY N/A 03/12/2013   Procedure: ESOPHAGOGASTRODUODENOSCOPY (EGD);  Surgeon: Inda Castle, MD;  Location: [facility];  Service: Endoscopy;  Laterality: N/A;  . HEMORRHOID SURGERY    . HERNIA REPAIR     inguinal x5  . KNEE SURGERY  arthroscopic   left  . PROSTATE CRYOABLATION     2000  . SHOULDER SURGERY  few years ago   left  . TRANSURETHRAL RESECTION OF PROSTATE N/A 03/19/2015   Procedure: TRANSURETHRAL RESECTION OF THE PROSTATE (TURP);  Surgeon: Carolan Clines, MD;  Location: [facility];  Service: Urology;  Laterality: N/A;    Family History  Problem Relation Age of Onset  . Breast cancer Mother   . Heart disease Father   . Heart attack Father   . Lymphoma Brother   . Stroke Neg Hx   . Hypertension Neg Hx     Social History:  reports that he quit smoking about 65 years ago. His smoking use included cigarettes. He has never used smokeless tobacco. He reports current alcohol use. He reports that he does not use drugs.  Allergies:  Allergies  Allergen Reactions  . Codeine Other (See Comments)    Patient was "all over the place"- undesired side effect  . Sulfasalazine Other (See Comments)  Reaction not recalled  . Sulfonamide Derivatives Other (See Comments)    Reaction not recalled    Medications:  I have reviewed the patient's current medications. Prior to Admission:  Medications Prior to Admission  Medication Sig Dispense Refill Last Dose  . acetaminophen (TYLENOL) 500 MG tablet Take 1,000 mg by mouth every morning.   05/12/2019 at am  . aspirin EC 81 MG tablet Take 81 mg by mouth daily.   05/12/2019 at 0800  . clopidogrel (PLAVIX) 75 MG tablet Take 1 tablet (75 mg total) by mouth daily. 90 tablet 1 05/12/2019 at 0800  .  furosemide (LASIX) 20 MG tablet TAKE 1 TABLET BY MOUTH  DAILY AS NEEDED FOR FLUID  OR EDEMA. (Patient taking differently: Take 40 mg by mouth daily as needed for fluid or edema. ) 90 tablet 3 05/09/2019 at Unknown time  . latanoprost (XALATAN) 0.005 % ophthalmic solution Place 1 drop into both eyes at bedtime. For Glaucoma  99 05/11/2019 at pm  . levothyroxine (SYNTHROID, LEVOTHROID) 50 MCG tablet Take 50 mcg by mouth daily before breakfast. For Hypothyroidism   05/12/2019 at am  . meclizine (ANTIVERT) 12.5 MG tablet Take 1 tablet (12.5 mg total) by mouth 3 (three) times daily as needed for dizziness. 30 tablet 0 05/12/2019 at Unknown time  . metoprolol succinate (TOPROL-XL) 25 MG 24 hr tablet Take 1 tablet (25 mg total) by mouth at bedtime. 90 tablet 1 05/11/2019 at 2100  . Multiple Vitamin (MULTIVITAMIN WITH MINERALS) TABS tablet Take 1 tablet by mouth daily with breakfast.    05/12/2019 at Unknown time  . nitroGLYCERIN (NITROSTAT) 0.4 MG SL tablet Place 0.4 mg under the tongue every 5 (five) minutes as needed (for Esophageal spasms).    unk at [employer]  . pantoprazole (PROTONIX) 40 MG tablet Take 1 tablet (40 mg total) by mouth daily. 90 tablet 1 05/12/2019 at Unknown time  . triamcinolone cream (KENALOG) 0.1 % Apply to affected rash bid prn. (Patient taking differently: Apply 1 application topically 2 (two) times daily as needed (to rash on head/scalp). ) 15 g 1 unk at unk  . albuterol (PROVENTIL HFA;VENTOLIN HFA) 108 (90 Base) MCG/ACT inhaler Inhale 2 puffs into the lungs every 4 (four) hours as needed for wheezing or shortness of breath. (Patient not taking: Reported on 05/12/2019) 1 Inhaler 0 Not Taking at Unknown time  . magnesium hydroxide (MILK OF MAGNESIA) 400 MG/5ML suspension Constipation (1 of 4 ) If no BM in 3 days, give 30 cc Milk of Magnesium p.o. x 1 dose in 24 hours as needed ( Do  Not use standing constipation orders for residents with renal failure CFR less than 30. Contact MD for orders)   Not  Taking at Unknown time  . traMADol (ULTRAM) 50 MG tablet Take 1 tablet (50 mg total) by mouth every 6 (six) hours as needed. (Patient not taking: Reported on 05/12/2019) 30 tablet 0 Not Taking at Unknown time   Scheduled: .  stroke: mapping our early stages of recovery book   Does not apply Once  . aspirin EC  81 mg Oral Daily  . atorvastatin  40 mg Oral q1800  . clopidogrel  75 mg Oral Daily  . enoxaparin (LOVENOX) injection  30 mg Subcutaneous Q24H  . latanoprost  1 drop Both Eyes QHS  . levothyroxine  50 mcg Oral QAC breakfast  . multivitamin with minerals  1 tablet Oral Daily  . pantoprazole  40 mg Oral Daily  . sodium chloride flush  3 mL Intravenous Once   Continuous: . sodium chloride 150 mL/hr at 05/13/19 1234   NOB:[id number], albuterol, hydrALAZINE, meclizine, nitroGLYCERIN, senna-docusate, traMADol Anti-infectives (From admission, onward)   None       Results for orders placed or performed during the hospital encounter of 05/12/19 (from the past 48 hour(s))  Protime-INR     Status: None   Collection Time: 05/12/19 10:32 AM  Result Value Ref Range   Prothrombin Time 13.7 11.4 - 15.2 seconds   INR 1.1 0.8 - 1.2    Comment: (NOTE) INR goal varies based on device and disease states. Performed at [facility], [place] [address], [place], [place] [address]   APTT     Status: None   Collection Time: 05/12/19 10:32 AM  Result Value Ref Range   aPTT 32 24 - 36 seconds    Comment: Performed at [facility] [address], [place] [address]  CBC     Status: None   Collection Time: 05/12/19 10:32 AM  Result Value Ref Range   WBC 7.7 4.0 - 10.5 K/uL   RBC 4.46 4.22 - 5.81 MIL/uL   Hemoglobin 14.1 13.0 - 17.0 g/dL   HCT 44.0 39.0 - 52.0 %   MCV 98.7 80.0 - 100.0 fL   MCH 31.6 26.0 - 34.0 pg   MCHC 32.0 30.0 - 36.0 g/dL   RDW 13.3 11.5 - 15.5 %   Platelets 232 150 - 400 K/uL   nRBC 0.0 0.0 - 0.2 %    Comment: Performed at [facility],  [place] [address], [place], [place] [address]  Differential     Status: None   Collection Time: 05/12/19 10:32 AM  Result Value Ref Range   Neutrophils Relative % 60 %   Neutro Abs 4.7 1.7 - 7.7 K/uL   Lymphocytes Relative 25 %   Lymphs Abs 1.9 0.7 - 4.0 K/uL   Monocytes Relative 11 %   Monocytes Absolute 0.8 0.1 - 1.0 K/uL   Eosinophils Relative 3 %   Eosinophils Absolute 0.2 0.0 - 0.5 K/uL   Basophils Relative 1 %   Basophils Absolute 0.1 0.0 - 0.1 K/uL   Immature Granulocytes 0 %   Abs Immature Granulocytes 0.02 0.00 - 0.07 K/uL    Comment: Performed at [facility] [address], [place], [place] [address]  Comprehensive metabolic panel     Status: Abnormal   Collection Time: 05/12/19 10:32 AM  Result Value Ref Range   Sodium 138 135 - 145 mmol/L   Potassium 4.3 3.5 - 5.1 mmol/L   Chloride 104 98 - 111 mmol/L   CO2 24 22 - 32 mmol/L   Glucose, Bld 132 (H) 70 - 99 mg/dL   BUN 19 8 - 23 mg/dL   Creatinine, Ser 1.57 (H) 0.61 - 1.24 mg/dL   Calcium 9.6 8.9 - 10.3 mg/dL   Total Protein 6.1 (L) 6.5 - 8.1 g/dL   Albumin 3.6 3.5 - 5.0 g/dL   AST 15 15 - 41 U/L   ALT 11 0 - 44 U/L   Alkaline Phosphatase 79 38 - 126 U/L   Total Bilirubin 0.7 0.3 - 1.2 mg/dL   GFR calc non Af Amer 36 (L) >60 mL/min   GFR calc Af Amer 42 (L) >60 mL/min   Anion gap 10 5 - 15    Comment: Performed at [facility] [address] [place], [place] [address]  CBG  monitoring, ED     Status: Abnormal   Collection Time: 05/12/19 10:49 AM  Result Value Ref Range   Glucose-Capillary 114 (H) 70 - 99 mg/dL  I-stat chem 8, ED     Status: Abnormal   Collection Time: 05/12/19 11:00 AM  Result Value Ref Range   Sodium 139 135 - 145 mmol/L   Potassium 4.3 3.5 - 5.1 mmol/L   Chloride 105 98 - 111 mmol/L   BUN 20 8 - 23 mg/dL   Creatinine, Ser 1.60 (H) 0.61 - 1.24 mg/dL   Glucose, Bld 124 (H) 70 - 99 mg/dL   Calcium, Ion 1.20 1.15 - 1.40 mmol/L   TCO2 27 22 - 32 mmol/L   Hemoglobin 14.6 13.0 - 17.0  g/dL   HCT 43.0 39.0 - 52.0 %  SARS Coronavirus 2 (CEPHEID - Performed in [facility] hospital lab), Hosp Order     Status: None   Collection Time: 05/12/19  3:36 PM   Specimen: Nasopharyngeal Swab  Result Value Ref Range   SARS Coronavirus 2 NEGATIVE NEGATIVE    Comment: (NOTE) If result is NEGATIVE SARS-CoV-2 target nucleic acids are NOT DETECTED. The SARS-CoV-2 RNA is generally detectable in upper and lower  respiratory specimens during the acute phase of infection. The lowest  concentration of SARS-CoV-2 viral copies this assay can detect is 250  copies / mL. A negative result does not preclude SARS-CoV-2 infection  and should not be used as the sole basis for treatment or other  patient management decisions.  A negative result may occur with  improper specimen collection / handling, submission of specimen other  than nasopharyngeal swab, presence of viral mutation(s) within the  areas targeted by this assay, and inadequate number of viral copies  (<250 copies / mL). A negative result must be combined with clinical  observations, patient history, and epidemiological information. If result is POSITIVE SARS-CoV-2 target nucleic acids are DETECTED. The SARS-CoV-2 RNA is generally detectable in upper and lower  respiratory specimens dur ing the acute phase of infection.  Positive  results are indicative of active infection with SARS-CoV-2.  Clinical  correlation with patient history and other diagnostic information is  necessary to determine patient infection status.  Positive results do  not rule out bacterial infection or co-infection with other viruses. If result is PRESUMPTIVE POSTIVE SARS-CoV-2 nucleic acids MAY BE PRESENT.   A presumptive positive result was obtained on the submitted specimen  and confirmed on repeat testing.  While 2019 novel coronavirus  (SARS-CoV-2) nucleic acids may be present in the submitted sample  additional confirmatory testing may be necessary for  epidemiological  and / or clinical management purposes  to differentiate between  SARS-CoV-2 and other Sarbecovirus currently known to infect humans.  If clinically indicated additional testing with an alternate test  methodology [id number]) is advised. The SARS-CoV-2 RNA is generally  detectable in upper and lower respiratory sp ecimens during the acute  phase of infection. The expected result is Negative. Fact Sheet for Patients:  StrictlyIdeas.no Fact Sheet for Healthcare Providers: BankingDealers.co.za This test is not yet approved or cleared by the [place] FDA and has been authorized for detection and/or diagnosis of SARS-CoV-2 by FDA under an Emergency Use Authorization (EUA).  This EUA will remain in effect (meaning this test can be used) for the duration of the COVID-19 declaration under Section 564(b)(1) of the Act, 21 U.S.C. section 360bbb-3(b)(1), unless the authorization is terminated or revoked sooner. Performed at [facility],  [address] [place], [place] [address]   Glucose, capillary     Status: None   Collection Time: 05/13/19  1:45 AM  Result Value Ref Range   Glucose-Capillary 97 70 - 99 mg/dL   Comment 1 Notify RN    Comment 2 Document in Chart   Lipid panel     Status: Abnormal   Collection Time: 05/13/19  6:32 AM  Result Value Ref Range   Cholesterol 203 (H) 0 - 200 mg/dL   Triglycerides 226 (H) <150 mg/dL   HDL 29 (L) >40 mg/dL   Total CHOL/HDL Ratio 7.0 RATIO   VLDL 45 (H) 0 - 40 mg/dL   LDL Cholesterol 129 (H) 0 - 99 mg/dL    Comment:        Total Cholesterol/HDL:CHD Risk Coronary Heart Disease Risk Table                     Men   Women  1/2 Average Risk   3.4   3.3  Average Risk       5.0   4.4  2 X Average Risk   9.6   7.1  3 X Average Risk  23.4   11.0        Use the calculated Patient Ratio above and the CHD Risk Table to determine the patient's CHD Risk.        ATP III  CLASSIFICATION (LDL):  <100     mg/dL   Optimal  100-129  mg/dL   Near or Above                    Optimal  130-159  mg/dL   Borderline  160-189  mg/dL   High  >190     mg/dL   Very High Performed at [facility] [address] [place], [place] [address]     Ct Angio Head W Or Wo Contrast  Result Date: 05/13/2019 CLINICAL DATA:  Right-sided weakness EXAM: CT ANGIOGRAPHY HEAD AND NECK TECHNIQUE: Multidetector CT imaging of the head and neck was performed using the standard protocol during bolus administration of intravenous contrast. Multiplanar CT image reconstructions and MIPs were obtained to evaluate the vascular anatomy. Carotid stenosis measurements (when applicable) are obtained utilizing NASCET criteria, using the distal internal carotid diameter as the denominator. CONTRAST:  74m OMNIPAQUE IOHEXOL 350 MG/ML SOLN COMPARISON:  Head CT 05/12/2019 FINDINGS: CTA NECK FINDINGS SKELETON: Large pannus at C2 that causes spinal canal stenosis and compression of the spinal cord as demonstrated on previous MRI. OTHER NECK: Normal pharynx, larynx and major salivary glands. No cervical lymphadenopathy. Unremarkable thyroid gland. UPPER CHEST: No pneumothorax or pleural effusion. No nodules or masses. AORTIC ARCH: There is mild calcific atherosclerosis of the aortic arch. There is no aneurysm, dissection or hemodynamically significant stenosis of the visualized ascending aorta and aortic arch. Conventional 3 vessel aortic branching pattern. The visualized proximal subclavian arteries are widely patent. RIGHT CAROTID SYSTEM: --Common carotid artery: There is noncalcified plaque within the right common carotid artery with less than 50% stenosis. --Internal carotid artery: No dissection, occlusion or aneurysm. There is mixed density atherosclerosis extending into the proximal ICA, resulting in 50% stenosis. --External carotid artery: No acute abnormality. LEFT CAROTID SYSTEM: --Common carotid artery:  Widely patent origin without common carotid artery dissection or aneurysm. --Internal carotid artery: No dissection, occlusion or aneurysm. Mild atherosclerotic calcification at the carotid bifurcation without hemodynamically significant stenosis. --External carotid artery: No acute abnormality.  VERTEBRAL ARTERIES: Left dominant configuration. Right vertebral artery origin is occluded, but most of the V1 segment is opacified. There is moderate stenosis of the left vertebral artery origin. The left V1-V3 segments are normal. The right vertebral artery is diffusely diminutive with long segment loss of opacification beginning at the C5 level. CTA HEAD FINDINGS POSTERIOR CIRCULATION: --Vertebral arteries: Severe stenosis of the left V4 segment due to predominantly calcified plaque. The right V4 segment is diminutive and only intermittently opacified. --Posterior inferior cerebellar arteries (PICA): Patent origins from the vertebral arteries. The right PICA may be supplied via collateral flow from the left side. --Anterior inferior cerebellar arteries (AICA): Not clearly visualize --Basilar artery: Severe stenosis of the midportion of the basilar artery (12:24) --Superior cerebellar arteries: Patent proximally --Posterior cerebral arteries (PCA): Posterior communicating arteries are diminutive or absent. There is multifocal severe stenosis of the right PCA, which remains patent. Stenosis of the left PCA are greatest at the proximal P2 segment. ANTERIOR CIRCULATION: --Intracranial internal carotid arteries: Atherosclerotic calcification of the internal carotid arteries at the skull base without hemodynamically significant stenosis. --Anterior cerebral arteries (ACA): Normal. Absent right A1 segment, normal variant --Middle cerebral arteries (MCA): There is focal hypoattenuation of the proximal left M1 segment, likely indicating nonocclusive thrombus (series 8, image 107, series 11, image 20). The inferior M2 division of  the left MCA is occluded beginning at the MCA bifurcation (series 10, images 140 3-147), extending for a length of approximately 7 mm. The distal branches of the left MCA are patent. VENOUS SINUSES: As permitted by contrast timing, patent. ANATOMIC VARIANTS: None Review of the MIP images confirms the above findings. IMPRESSION: 1. Nonocclusive thrombus within the proximal M1 segment of the left middle cerebral artery, with occlusion of the inferior division left M2 segment beginning at the MCA bifurcation and extending over length of approximately 7 mm. Compared to the MRI/MRA of 12/31/2018, these findings are unchanged. These results were called by telephone at the time of interpretation on 05/13/2019 at 2:40 am to Dr. Kerney Elbe , who verbally acknowledged these results. 2. 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 3. Severe stenosis of the basilar artery and multifocal bilateral posterior cerebral artery severe stenoses, also unchanged. 4. Approximately 50% stenosis of the proximal right internal carotid artery which is otherwise normal. 5. Large amount of pannus dorsal to the odontoid process with spinal canal stenosis, better characterized on recent MRI. Electronically Signed   By: Ulyses Jarred M.D.   On: 05/13/2019 02:43   Ct Angio Neck W Or Wo Contrast  Result Date: 05/13/2019 CLINICAL DATA:  Right-sided weakness EXAM: CT ANGIOGRAPHY HEAD AND NECK TECHNIQUE: Multidetector CT imaging of the head and neck was performed using the standard protocol during bolus administration of intravenous contrast. Multiplanar CT image reconstructions and MIPs were obtained to evaluate the vascular anatomy. Carotid stenosis measurements (when applicable) are obtained utilizing NASCET criteria, using the distal internal carotid diameter as the denominator. CONTRAST:  79m OMNIPAQUE IOHEXOL 350 MG/ML SOLN COMPARISON:  Head CT 05/12/2019 FINDINGS:  CTA NECK FINDINGS SKELETON: Large pannus at C2 that causes spinal canal stenosis and compression of the spinal cord as demonstrated on previous MRI. OTHER NECK: Normal pharynx, larynx and major salivary glands. No cervical lymphadenopathy. Unremarkable thyroid gland. UPPER CHEST: No pneumothorax or pleural effusion. No nodules or masses. AORTIC ARCH: There is mild calcific atherosclerosis of the aortic arch. There is no aneurysm, dissection or hemodynamically  significant stenosis of the visualized ascending aorta and aortic arch. Conventional 3 vessel aortic branching pattern. The visualized proximal subclavian arteries are widely patent. RIGHT CAROTID SYSTEM: --Common carotid artery: There is noncalcified plaque within the right common carotid artery with less than 50% stenosis. --Internal carotid artery: No dissection, occlusion or aneurysm. There is mixed density atherosclerosis extending into the proximal ICA, resulting in 50% stenosis. --External carotid artery: No acute abnormality. LEFT CAROTID SYSTEM: --Common carotid artery: Widely patent origin without common carotid artery dissection or aneurysm. --Internal carotid artery: No dissection, occlusion or aneurysm. Mild atherosclerotic calcification at the carotid bifurcation without hemodynamically significant stenosis. --External carotid artery: No acute abnormality. VERTEBRAL ARTERIES: Left dominant configuration. Right vertebral artery origin is occluded, but most of the V1 segment is opacified. There is moderate stenosis of the left vertebral artery origin. The left V1-V3 segments are normal. The right vertebral artery is diffusely diminutive with long segment loss of opacification beginning at the C5 level. CTA HEAD FINDINGS POSTERIOR CIRCULATION: --Vertebral arteries: Severe stenosis of the left V4 segment due to predominantly calcified plaque. The right V4 segment is diminutive and only intermittently opacified. --Posterior inferior cerebellar arteries  (PICA): Patent origins from the vertebral arteries. The right PICA may be supplied via collateral flow from the left side. --Anterior inferior cerebellar arteries (AICA): Not clearly visualize --Basilar artery: Severe stenosis of the midportion of the basilar artery (12:24) --Superior cerebellar arteries: Patent proximally --Posterior cerebral arteries (PCA): Posterior communicating arteries are diminutive or absent. There is multifocal severe stenosis of the right PCA, which remains patent. Stenosis of the left PCA are greatest at the proximal P2 segment. ANTERIOR CIRCULATION: --Intracranial internal carotid arteries: Atherosclerotic calcification of the internal carotid arteries at the skull base without hemodynamically significant stenosis. --Anterior cerebral arteries (ACA): Normal. Absent right A1 segment, normal variant --Middle cerebral arteries (MCA): There is focal hypoattenuation of the proximal left M1 segment, likely indicating nonocclusive thrombus (series 8, image 107, series 11, image 20). The inferior M2 division of the left MCA is occluded beginning at the MCA bifurcation (series 10, images 140 3-147), extending for a length of approximately 7 mm. The distal branches of the left MCA are patent. VENOUS SINUSES: As permitted by contrast timing, patent. ANATOMIC VARIANTS: None Review of the MIP images confirms the above findings. IMPRESSION: 1. Nonocclusive thrombus within the proximal M1 segment of the left middle cerebral artery, with occlusion of the inferior division left M2 segment beginning at the MCA bifurcation and extending over length of approximately 7 mm. Compared to the MRI/MRA of 12/31/2018, these findings are unchanged. These results were called by telephone at the time of interpretation on 05/13/2019 at 2:40 am to Dr. Kerney Elbe , who verbally acknowledged these results. 2. 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 3. Severe stenosis of the basilar artery and multifocal bilateral posterior cerebral artery severe stenoses, also unchanged. 4. Approximately 50% stenosis of the proximal right internal carotid artery which is otherwise normal. 5. Large amount of pannus dorsal to the odontoid process with spinal canal stenosis, better characterized on recent MRI. Electronically Signed   By: Ulyses Jarred M.D.   On: 05/13/2019 02:43   Mr Angio Head Wo Contrast  Result Date: 05/12/2019 CLINICAL DATA:  RIGHT-sided weakness and slurred speech. EXAM: MRI HEAD WITHOUT CONTRAST MRA HEAD WITHOUT CONTRAST TECHNIQUE: Multiplanar, multiecho pulse sequences of the brain and surrounding structures were obtained without intravenous contrast. Angiographic  images of the head were obtained using MRA technique without contrast. COMPARISON:  CT head earlier today. MR head 12/31/2018. MRI cervical spine described separately. FINDINGS: MRI HEAD FINDINGS Brain: There is a cluster of punctate foci, restricted diffusion, LEFT paramedian pons, corresponding low ADC, consistent with acute infarction. No hemorrhage, mass lesion, or extra-axial fluid. Advanced atrophy, not unexpected for age, with hydrocephalus ex vacuo. Mild T2 and FLAIR hyperintensities in the white matter, likely small vessel disease. Vascular: Reported separately. Skull and upper cervical spine: Normal marrow signal. There is extreme pannus surrounding the odontoid, with severe cervicomedullary compression. Sinuses/Orbits: No acute findings.  BILATERAL cataract extraction. Other: No mastoid fluid. MRA HEAD FINDINGS RIGHT ICA: Tandem 50% stenoses in the cavernous segment. Cervical and petrous segments widely patent. Mild narrowing of the supraclinoid ICA, with widely patent terminus. LEFT ICA: 50% stenosis of the cavernous/supraclinoid ICA junction. No flow reducing stenoses more distally. ICA terminus widely patent. RIGHT MCA: 50-75% stenosis in its mid M1 segment. The MCA  bifurcation demonstrates mild to moderate disease affecting the M2 segments and beyond. LEFT MCA: Critical stenosis proximal M1 segment, 5 mm segment of diminished to absent flow related enhancement. Poor flow related enhancement of MCA vessels distally particularly the LEFT M2 inferior division. LEFT ACA: Dominant segment. Disc is rise to both distal anterior cerebral arteries RIGHT ACA: Atretic or occluded. Basilar artery: Multiple segmental stenoses, at least two of which are significantly flow reducing. 75% stenosis in the proximal basilar. 75-90% stenosis in the distal basilar. 50-75% stenosis in the mid basilar. LEFT vertebral: Widely patent. RIGHT vertebral: Severely diseased, occluded at skull base. High-grade stenosis distal V4 segment, likely retrograde filling. Posterior cerebral arteries: Severe BILATERAL flow-limiting stenoses, 75-90% in the P1-P2 junctions. Beyond this, BILATERAL segmental PCA stenoses likely flow reducing, greater on the RIGHT. Cerebellar branches: Poorly seen. IMPRESSION: Acute nonhemorrhagic infarction affecting the LEFT paramedian pons. Advanced atrophy and mild small vessel disease. Widespread intracranial atherosclerotic disease, with moderate to severe flow-limiting tandem stenoses, most notable in the basilar artery. High-grade stenosis of the LEFT M1 MCA is also of concern. See discussion above. Electronically Signed   By: Staci Righter M.D.   On: 05/12/2019 15:36   Mr Brain Wo Contrast  Result Date: 05/12/2019 CLINICAL DATA:  RIGHT-sided weakness and slurred speech. EXAM: MRI HEAD WITHOUT CONTRAST MRA HEAD WITHOUT CONTRAST TECHNIQUE: Multiplanar, multiecho pulse sequences of the brain and surrounding structures were obtained without intravenous contrast. Angiographic images of the head were obtained using MRA technique without contrast. COMPARISON:  CT head earlier today. MR head 12/31/2018. MRI cervical spine described separately. FINDINGS: MRI HEAD FINDINGS Brain: There  is a cluster of punctate foci, restricted diffusion, LEFT paramedian pons, corresponding low ADC, consistent with acute infarction. No hemorrhage, mass lesion, or extra-axial fluid. Advanced atrophy, not unexpected for age, with hydrocephalus ex vacuo. Mild T2 and FLAIR hyperintensities in the white matter, likely small vessel disease. Vascular: Reported separately. Skull and upper cervical spine: Normal marrow signal. There is extreme pannus surrounding the odontoid, with severe cervicomedullary compression. Sinuses/Orbits: No acute findings.  BILATERAL cataract extraction. Other: No mastoid fluid. MRA HEAD FINDINGS RIGHT ICA: Tandem 50% stenoses in the cavernous segment. Cervical and petrous segments widely patent. Mild narrowing of the supraclinoid ICA, with widely patent terminus. LEFT ICA: 50% stenosis of the cavernous/supraclinoid ICA junction. No flow reducing stenoses more distally. ICA terminus widely patent. RIGHT MCA: 50-75% stenosis in its mid M1 segment. The MCA bifurcation demonstrates mild to moderate disease affecting the M2  segments and beyond. LEFT MCA: Critical stenosis proximal M1 segment, 5 mm segment of diminished to absent flow related enhancement. Poor flow related enhancement of MCA vessels distally particularly the LEFT M2 inferior division. LEFT ACA: Dominant segment. Disc is rise to both distal anterior cerebral arteries RIGHT ACA: Atretic or occluded. Basilar artery: Multiple segmental stenoses, at least two of which are significantly flow reducing. 75% stenosis in the proximal basilar. 75-90% stenosis in the distal basilar. 50-75% stenosis in the mid basilar. LEFT vertebral: Widely patent. RIGHT vertebral: Severely diseased, occluded at skull base. High-grade stenosis distal V4 segment, likely retrograde filling. Posterior cerebral arteries: Severe BILATERAL flow-limiting stenoses, 75-90% in the P1-P2 junctions. Beyond this, BILATERAL segmental PCA stenoses likely flow reducing, greater  on the RIGHT. Cerebellar branches: Poorly seen. IMPRESSION: Acute nonhemorrhagic infarction affecting the LEFT paramedian pons. Advanced atrophy and mild small vessel disease. Widespread intracranial atherosclerotic disease, with moderate to severe flow-limiting tandem stenoses, most notable in the basilar artery. High-grade stenosis of the LEFT M1 MCA is also of concern. See discussion above. Electronically Signed   By: Staci Righter M.D.   On: 05/12/2019 15:36   Mr Cervical Spine Wo Contrast  Result Date: 05/12/2019 CLINICAL DATA:  C-spine trauma, myelopathy. EXAM: MRI CERVICAL SPINE WITHOUT CONTRAST TECHNIQUE: Multiplanar, multisequence MR imaging of the cervical spine was performed. No intravenous contrast was administered. COMPARISON:  Partial visualization of cervicomedullary junction on prior MR brain studies. FINDINGS: Alignment: 6 mm anterolisthesis C7 on T1. There is 3 mm degenerative anterolisthesis C2-C3. Otherwise anatomic alignment. Vertebrae: Significant erosion of C2 due to pannus. Cord: Severe cord compression opposite C2, related to pannus. Abnormal cord signal is identified along the lower margin of the cord compression, see series 6, image 9. Posterior Fossa, vertebral arteries, paraspinal tissues: No tonsillar herniation. LEFT vertebral artery is dominant. RIGHT vertebral is occluded. Disc levels: C1-C2: Severe pannus, projects posteriorly resulting in cord compression as well as erodes the odontoid. Pannus measures up to 13 mm in anterior-posterior extent. Canal diameter measures 4 mm at its most narrow point. C2-3: 3 mm facet mediated anterolisthesis. No stenosis. BILATERAL C3 foraminal narrowing. C3-4: Central protrusion with osseous spurring. Facet arthropathy. BILATERAL C4 foraminal narrowing. C4-5: Central protrusion with osseous spurring. Facet arthropathy. BILATERAL C5 foraminal narrowing. C5-6: Central protrusion with osseous spurring. Facet arthropathy. BILATERAL C6 foraminal  narrowing. C6-7: Central protrusion with osseous spurring. RIGHT greater than LEFT C7 foraminal narrowing. C7-T1: 6 mm anterolisthesis. Advanced disc space narrowing. Facet arthropathy. BILATERAL C8 foraminal narrowing. 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 Abnormal cord signal is identified. Surgical consultation is warranted. Multilevel spondylosis resulting in foraminal narrowing from C2-3 through C7-T1. See discussion above. Electronically Signed   By: Staci Righter M.D.   On: 05/12/2019 15:46   Ct Head Code Stroke Wo Contrast  Result Date: 05/12/2019 CLINICAL DATA:  Code stroke. Left-sided weakness slurred speech. LV0 positive EXAM: CT HEAD WITHOUT CONTRAST TECHNIQUE: Contiguous axial images were obtained from the base of the skull through the vertex without intravenous contrast. COMPARISON:  MRI head 12/31/2018 FINDINGS: Brain: Moderate atrophy with ventricular enlargement which is stable from prior studies. Negative for acute infarct.  Negative for acute hemorrhage or mass. Vascular: Negative for hyperdense vessel Skull: No skull lesions. Extensive calcified pannus posterior to the dens causing spinal stenosis as noted on prior MRI. Sinuses/Orbits: Mild mucosal edema paranasal sinuses. Bilateral cataract surgery. Other: None ASPECTS [facility] Stroke Program Early CT  Score) - Ganglionic level infarction (caudate, lentiform nuclei, internal capsule, insula, M1-M3 cortex): 7 - Supraganglionic infarction (M4-M6 cortex): 3 Total score (0-10 with 10 being normal): 10 IMPRESSION: 1. No acute intracranial abnormality 2. Atrophy and mild chronic microvascular ischemia 3. ASPECTS is 10 4. Severe pannus posterior to the dens causing spinal stenosis and cord compression on the right as noted on prior MRI 5. These results were called by telephone at the time of interpretation on 05/12/2019 at 11:10 am to Dr. Rory Percy, who verbally  acknowledged these results. Electronically Signed   By: Franchot Gallo M.D.   On: 05/12/2019 11:13    ROS Blood pressure (!) 161/95, pulse 85, temperature 98.3 F (36.8 C), temperature source Axillary, resp. rate 18, height _0  (1.575 m), weight 66.9 kg, SpO2 96 %. Estimated body mass index is 26.98 kg/m as calculated from the following:   Height as of this encounter: _1  (1.575 m).   Weight as of this encounter: 66.9 kg.  Physical Exam  General: An alert and pleasant 85 year old white male with a right facial droop.  HEENT: Normocephalic, extraocular muscles are intact.:  Unremarkable  Thorax: Symmetric  Abdomen: Soft  Extremities: Unremarkable  Neurologic exam: The patient is alert and oriented x3, Glasgow Coma Scale 15.  Cranial nerve exam demonstrates the patient is blind in his left eye and has a right facial droop.  The patient is right hemiparetic approximately 4/5 strength in his right upper and lower extremity.  He is dysarthric.  Imaging studies I reviewed the patient's cervical MRI performed yesterday at [facility].  He has a C1-2 pannus with stenosis and spinal cord signal change.  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  There is really nothing we can do about this as the patient is not an appropriate surgical candidate for an occipital cervical, decompression instrumentation, and fusion.  Appropriately, the patient is not interested in surgery.  Pontine stroke-cerebrovascular disease: I will leave this to the expertise of neurology.  It like he will need rehab.  I will sign off.  Please call if I can be of further assistance.  Ophelia Charter 05/13/2019, 1:02 PM

## 2019-05-13 NOTE — Progress Notes (Signed)
Subjective: Called to the floor for acute change in neurological status. Went from NIHSS of 2 to 12. Exam earlier per nursing was with ataxia and chronic left eye blindness, now unable to move RUE with minimal movement of RLE. Also with new right facial droop. LKN was 11:45 PM. Patient called RN with new symptoms at 1:15 AM.   He was admitted for evaluation of acute right sided weakness beginning on Saturday night.   MRI brain on initial evaluation revealed an acute nonhemorrhagic infarction affecting the LEFT paramedian pons. Also noted was advanced atrophy and mild small vessel disease.  MRA head on initial evaluation revealed widespread intracranial atherosclerotic disease, with moderate to severe flow-limiting tandem stenoses, most notable in the basilar artery. High-grade stenosis of the LEFT M1 MCA was also of concern.  NS 1000 cc bolus has been started for possible left MCA/ACA watershed TIA.   Objective: Current vital signs: BP 105/60 (BP Location: Right Arm)   Pulse 96   Temp (!) 97.5 F (36.4 C) (Oral)   Resp 16   Ht 5\' 2"  (1.575 m)   Wt 66.9 kg   SpO2 93%   BMI 26.98 kg/m  Vital signs in last 24 hours: Temp:  [97.5 F (36.4 C)-97.8 F (36.6 C)] 97.5 F (36.4 C) (06/22 0118) Pulse Rate:  [83-96] 96 (06/22 0118) Resp:  [16-20] 16 (06/21 2315) BP: (105-163)/(60-99) 105/60 (06/22 0118) SpO2:  [93 %-100 %] 93 % (06/22 0118) Weight:  [66.9 kg] 66.9 kg (06/21 1931)  Intake/Output from previous day: No intake/output data recorded. Intake/Output this shift: No intake/output data recorded. Nutritional status:  Diet Order            Diet Heart Room service appropriate? Yes; Fluid consistency: Thin  Diet effective now             HEENT: [place]/AT Lungs: Respirations unlabored Ext: Mild edema distal BLE  Neurologic Exam: Performed after approximately 100 cc of NS bolus has been administered.  Ment: Awake and alert. Able to answer all questions and follow all commands.  Speech dysarthric in the context of being edentulous. In this context speech output is fluent. No neglect.  CN: EOMI. Blind in left eye. Visual fields intact in right eye. Prominent right facial droop. Temp sensation equal.  Motor:  RUE flaccid with 0/5 strength RLE: Able to slightly internally and externally rotate. Unable to wiggle toes.  LUE and LLE: 5/5 Sensory: Intact to FT BUE and BLE. Patient states there is no asymmetry.  Reflexes: 1+ bilateral biceps and brachioradialis. 0 patellae and achilles bilaterally.   Repeat exam after 200 cc of NS bolus administered: SBP increased to 120's. Now able to lift RUE off the bed antigravity and weakly squeeze examiner's hand, but still weaker than on the left. Able to transiently left RLE off bed, but still weak. Right facial droop present but improving. Dysarthria improving.    Lab Results: Results for orders placed or performed during the hospital encounter of 05/12/19 (from the past 48 hour(s))  Protime-INR     Status: None   Collection Time: 05/12/19 10:32 AM  Result Value Ref Range   Prothrombin Time 13.7 11.4 - 15.2 seconds   INR 1.1 0.8 - 1.2    Comment: (NOTE) INR goal varies based on device and disease states. Performed at [facility], [place] [address], [place], [place] [address]   APTT     Status: None   Collection Time: 05/12/19 10:32 AM  Result Value Ref  Range   aPTT 32 24 - 36 seconds    Comment: Performed at [facility] [address], [place] [address]  CBC     Status: None   Collection Time: 05/12/19 10:32 AM  Result Value Ref Range   WBC 7.7 4.0 - 10.5 K/uL   RBC 4.46 4.22 - 5.81 MIL/uL   Hemoglobin 14.1 13.0 - 17.0 g/dL   HCT 44.0 39.0 - 52.0 %   MCV 98.7 80.0 - 100.0 fL   MCH 31.6 26.0 - 34.0 pg   MCHC 32.0 30.0 - 36.0 g/dL   RDW 13.3 11.5 - 15.5 %   Platelets 232 150 - 400 K/uL   nRBC 0.0 0.0 - 0.2 %    Comment: Performed at [facility], [place] [address], [place], [place] [address]   Differential     Status: None   Collection Time: 05/12/19 10:32 AM  Result Value Ref Range   Neutrophils Relative % 60 %   Neutro Abs 4.7 1.7 - 7.7 K/uL   Lymphocytes Relative 25 %   Lymphs Abs 1.9 0.7 - 4.0 K/uL   Monocytes Relative 11 %   Monocytes Absolute 0.8 0.1 - 1.0 K/uL   Eosinophils Relative 3 %   Eosinophils Absolute 0.2 0.0 - 0.5 K/uL   Basophils Relative 1 %   Basophils Absolute 0.1 0.0 - 0.1 K/uL   Immature Granulocytes 0 %   Abs Immature Granulocytes 0.02 0.00 - 0.07 K/uL    Comment: Performed at [facility] [address] [place], [place] [address]  Comprehensive metabolic panel     Status: Abnormal   Collection Time: 05/12/19 10:32 AM  Result Value Ref Range   Sodium 138 135 - 145 mmol/L   Potassium 4.3 3.5 - 5.1 mmol/L   Chloride 104 98 - 111 mmol/L   CO2 24 22 - 32 mmol/L   Glucose, Bld 132 (H) 70 - 99 mg/dL   BUN 19 8 - 23 mg/dL   Creatinine, Ser 1.57 (H) 0.61 - 1.24 mg/dL   Calcium 9.6 8.9 - 10.3 mg/dL   Total Protein 6.1 (L) 6.5 - 8.1 g/dL   Albumin 3.6 3.5 - 5.0 g/dL   AST 15 15 - 41 U/L   ALT 11 0 - 44 U/L   Alkaline Phosphatase 79 38 - 126 U/L   Total Bilirubin 0.7 0.3 - 1.2 mg/dL   GFR calc non Af Amer 36 (L) >60 mL/min   GFR calc Af Amer 42 (L) >60 mL/min   Anion gap 10 5 - 15    Comment: Performed at [facility] [address] [place], [place] [address]  CBG monitoring, ED     Status: Abnormal   Collection Time: 05/12/19 10:49 AM  Result Value Ref Range   Glucose-Capillary 114 (H) 70 - 99 mg/dL  I-stat chem 8, ED     Status: Abnormal   Collection Time: 05/12/19 11:00 AM  Result Value Ref Range   Sodium 139 135 - 145 mmol/L   Potassium 4.3 3.5 - 5.1 mmol/L   Chloride 105 98 - 111 mmol/L   BUN 20 8 - 23 mg/dL   Creatinine, Ser 1.60 (H) 0.61 - 1.24 mg/dL   Glucose, Bld 124 (H) 70 - 99 mg/dL   Calcium, Ion 1.20 1.15 - 1.40 mmol/L   TCO2 27 22 - 32 mmol/L   Hemoglobin 14.6 13.0 - 17.0 g/dL   HCT 43.0 39.0 - 52.0 %  SARS  Coronavirus 2 (CEPHEID - Performed in [facility] hospital lab), Hosp Order     Status: None   Collection Time: 05/12/19  3:36 PM   Specimen: Nasopharyngeal Swab  Result Value Ref Range   SARS Coronavirus 2 NEGATIVE NEGATIVE    Comment: (NOTE) If result is NEGATIVE SARS-CoV-2 target nucleic acids are NOT DETECTED. The SARS-CoV-2 RNA is generally detectable in upper and lower  respiratory specimens during the acute phase of infection. The lowest  concentration of SARS-CoV-2 viral copies this assay can detect is 250  copies / mL. A negative result does not preclude SARS-CoV-2 infection  and should not be used as the sole basis for treatment or other  patient management decisions.  A negative result may occur with  improper specimen collection / handling, submission of specimen other  than nasopharyngeal swab, presence of viral mutation(s) within the  areas targeted by this assay, and inadequate number of viral copies  (<250 copies / mL). A negative result must be combined with clinical  observations, patient history, and epidemiological information. If result is POSITIVE SARS-CoV-2 target nucleic acids are DETECTED. The SARS-CoV-2 RNA is generally detectable in upper and lower  respiratory specimens dur ing the acute phase of infection.  Positive  results are indicative of active infection with SARS-CoV-2.  Clinical  correlation with patient history and other diagnostic information is  necessary to determine patient infection status.  Positive results do  not rule out bacterial infection or co-infection with other viruses. If result is PRESUMPTIVE POSTIVE SARS-CoV-2 nucleic acids MAY BE PRESENT.   A presumptive positive result was obtained on the submitted specimen  and confirmed on repeat testing.  While 2019 novel coronavirus  (SARS-CoV-2) nucleic acids may be present in the submitted sample  additional confirmatory testing may be necessary for epidemiological  and / or clinical  management purposes  to differentiate between  SARS-CoV-2 and other Sarbecovirus currently known to infect humans.  If clinically indicated additional testing with an alternate test  methodology [phone]) is advised. The SARS-CoV-2 RNA is generally  detectable in upper and lower respiratory sp ecimens during the acute  phase of infection. The expected result is Negative. Fact Sheet for Patients:  StrictlyIdeas.no Fact Sheet for Healthcare Providers: BankingDealers.co.za This test is not yet approved or cleared by the [place] FDA and has been authorized for detection and/or diagnosis of SARS-CoV-2 by FDA under an Emergency Use Authorization (EUA).  This EUA will remain in effect (meaning this test can be used) for the duration of the COVID-19 declaration under Section 564(b)(1) of the Act, 21 U.S.C. section 360bbb-3(b)(1), unless the authorization is terminated or revoked sooner. Performed at [facility], [place] [address] [place], [place] [address]     Recent Results (from the past 240 hour(s))  SARS Coronavirus 2 (CEPHEID - Performed in [facility] hospital lab), Hosp Order     Status: None   Collection Time: 05/12/19  3:36 PM   Specimen: Nasopharyngeal Swab  Result Value Ref Range Status   SARS Coronavirus 2 NEGATIVE NEGATIVE Final    Comment: (NOTE) If result is NEGATIVE SARS-CoV-2 target nucleic acids are NOT DETECTED. The SARS-CoV-2 RNA is generally detectable in upper and lower  respiratory specimens during the acute phase of infection. The lowest  concentration of SARS-CoV-2 viral copies this assay can detect is 250  copies / mL. A negative result does not preclude SARS-CoV-2 infection  and should not be used as the sole basis for treatment  or other  patient management decisions.  A negative result may occur with  improper specimen collection / handling, submission of specimen other  than nasopharyngeal swab,  presence of viral mutation(s) within the  areas targeted by this assay, and inadequate number of viral copies  (<250 copies / mL). A negative result must be combined with clinical  observations, patient history, and epidemiological information. If result is POSITIVE SARS-CoV-2 target nucleic acids are DETECTED. The SARS-CoV-2 RNA is generally detectable in upper and lower  respiratory specimens dur ing the acute phase of infection.  Positive  results are indicative of active infection with SARS-CoV-2.  Clinical  correlation with patient history and other diagnostic information is  necessary to determine patient infection status.  Positive results do  not rule out bacterial infection or co-infection with other viruses. If result is PRESUMPTIVE POSTIVE SARS-CoV-2 nucleic acids MAY BE PRESENT.   A presumptive positive result was obtained on the submitted specimen  and confirmed on repeat testing.  While 2019 novel coronavirus  (SARS-CoV-2) nucleic acids may be present in the submitted sample  additional confirmatory testing may be necessary for epidemiological  and / or clinical management purposes  to differentiate between  SARS-CoV-2 and other Sarbecovirus currently known to infect humans.  If clinically indicated additional testing with an alternate test  methodology [phone]) is advised. The SARS-CoV-2 RNA is generally  detectable in upper and lower respiratory sp ecimens during the acute  phase of infection. The expected result is Negative. Fact Sheet for Patients:  StrictlyIdeas.no Fact Sheet for Healthcare Providers: BankingDealers.co.za This test is not yet approved or cleared by the [place] FDA and has been authorized for detection and/or diagnosis of SARS-CoV-2 by FDA under an Emergency Use Authorization (EUA).  This EUA will remain in effect (meaning this test can be used) for the duration of the COVID-19 declaration under  Section 564(b)(1) of the Act, 21 U.S.C. section 360bbb-3(b)(1), unless the authorization is terminated or revoked sooner. Performed at [facility], [place] [address], [place], [place] [address]     Lipid Panel No results for input(s): CHOL, TRIG, HDL, CHOLHDL, VLDL, LDLCALC in the last 72 hours.  Studies/Results: Mr Angio Head Wo Contrast  Result Date: 05/12/2019 CLINICAL DATA:  RIGHT-sided weakness and slurred speech. EXAM: MRI HEAD WITHOUT CONTRAST MRA HEAD WITHOUT CONTRAST TECHNIQUE: Multiplanar, multiecho pulse sequences of the brain and surrounding structures were obtained without intravenous contrast. Angiographic images of the head were obtained using MRA technique without contrast. COMPARISON:  CT head earlier today. MR head 12/31/2018. MRI cervical spine described separately. FINDINGS: MRI HEAD FINDINGS Brain: There is a cluster of punctate foci, restricted diffusion, LEFT paramedian pons, corresponding low ADC, consistent with acute infarction. No hemorrhage, mass lesion, or extra-axial fluid. Advanced atrophy, not unexpected for age, with hydrocephalus ex vacuo. Mild T2 and FLAIR hyperintensities in the white matter, likely small vessel disease. Vascular: Reported separately. Skull and upper cervical spine: Normal marrow signal. There is extreme pannus surrounding the odontoid, with severe cervicomedullary compression. Sinuses/Orbits: No acute findings.  BILATERAL cataract extraction. Other: No mastoid fluid. MRA HEAD FINDINGS RIGHT ICA: Tandem 50% stenoses in the cavernous segment. Cervical and petrous segments widely patent. Mild narrowing of the supraclinoid ICA, with widely patent terminus. LEFT ICA: 50% stenosis of the cavernous/supraclinoid ICA junction. No flow reducing stenoses more distally. ICA terminus widely patent. RIGHT MCA: 50-75% stenosis in its mid M1 segment. The MCA bifurcation demonstrates mild to moderate disease affecting the M2 segments and  beyond. LEFT MCA:  Critical stenosis proximal M1 segment, 5 mm segment of diminished to absent flow related enhancement. Poor flow related enhancement of MCA vessels distally particularly the LEFT M2 inferior division. LEFT ACA: Dominant segment. Disc is rise to both distal anterior cerebral arteries RIGHT ACA: Atretic or occluded. Basilar artery: Multiple segmental stenoses, at least two of which are significantly flow reducing. 75% stenosis in the proximal basilar. 75-90% stenosis in the distal basilar. 50-75% stenosis in the mid basilar. LEFT vertebral: Widely patent. RIGHT vertebral: Severely diseased, occluded at skull base. High-grade stenosis distal V4 segment, likely retrograde filling. Posterior cerebral arteries: Severe BILATERAL flow-limiting stenoses, 75-90% in the P1-P2 junctions. Beyond this, BILATERAL segmental PCA stenoses likely flow reducing, greater on the RIGHT. Cerebellar branches: Poorly seen. IMPRESSION: Acute nonhemorrhagic infarction affecting the LEFT paramedian pons. Advanced atrophy and mild small vessel disease. Widespread intracranial atherosclerotic disease, with moderate to severe flow-limiting tandem stenoses, most notable in the basilar artery. High-grade stenosis of the LEFT M1 MCA is also of concern. See discussion above. Electronically Signed   By: Staci Righter M.D.   On: 05/12/2019 15:36   Mr Brain Wo Contrast  Result Date: 05/12/2019 CLINICAL DATA:  RIGHT-sided weakness and slurred speech. EXAM: MRI HEAD WITHOUT CONTRAST MRA HEAD WITHOUT CONTRAST TECHNIQUE: Multiplanar, multiecho pulse sequences of the brain and surrounding structures were obtained without intravenous contrast. Angiographic images of the head were obtained using MRA technique without contrast. COMPARISON:  CT head earlier today. MR head 12/31/2018. MRI cervical spine described separately. FINDINGS: MRI HEAD FINDINGS Brain: There is a cluster of punctate foci, restricted diffusion, LEFT paramedian pons, corresponding low ADC,  consistent with acute infarction. No hemorrhage, mass lesion, or extra-axial fluid. Advanced atrophy, not unexpected for age, with hydrocephalus ex vacuo. Mild T2 and FLAIR hyperintensities in the white matter, likely small vessel disease. Vascular: Reported separately. Skull and upper cervical spine: Normal marrow signal. There is extreme pannus surrounding the odontoid, with severe cervicomedullary compression. Sinuses/Orbits: No acute findings.  BILATERAL cataract extraction. Other: No mastoid fluid. MRA HEAD FINDINGS RIGHT ICA: Tandem 50% stenoses in the cavernous segment. Cervical and petrous segments widely patent. Mild narrowing of the supraclinoid ICA, with widely patent terminus. LEFT ICA: 50% stenosis of the cavernous/supraclinoid ICA junction. No flow reducing stenoses more distally. ICA terminus widely patent. RIGHT MCA: 50-75% stenosis in its mid M1 segment. The MCA bifurcation demonstrates mild to moderate disease affecting the M2 segments and beyond. LEFT MCA: Critical stenosis proximal M1 segment, 5 mm segment of diminished to absent flow related enhancement. Poor flow related enhancement of MCA vessels distally particularly the LEFT M2 inferior division. LEFT ACA: Dominant segment. Disc is rise to both distal anterior cerebral arteries RIGHT ACA: Atretic or occluded. Basilar artery: Multiple segmental stenoses, at least two of which are significantly flow reducing. 75% stenosis in the proximal basilar. 75-90% stenosis in the distal basilar. 50-75% stenosis in the mid basilar. LEFT vertebral: Widely patent. RIGHT vertebral: Severely diseased, occluded at skull base. High-grade stenosis distal V4 segment, likely retrograde filling. Posterior cerebral arteries: Severe BILATERAL flow-limiting stenoses, 75-90% in the P1-P2 junctions. Beyond this, BILATERAL segmental PCA stenoses likely flow reducing, greater on the RIGHT. Cerebellar branches: Poorly seen. IMPRESSION: Acute nonhemorrhagic infarction  affecting the LEFT paramedian pons. Advanced atrophy and mild small vessel disease. Widespread intracranial atherosclerotic disease, with moderate to severe flow-limiting tandem stenoses, most notable in the basilar artery. High-grade stenosis of the LEFT M1 MCA is also of concern. See discussion above. Electronically  Signed   By: Staci Righter M.D.   On: 05/12/2019 15:36   Mr Cervical Spine Wo Contrast  Result Date: 05/12/2019 CLINICAL DATA:  C-spine trauma, myelopathy. EXAM: MRI CERVICAL SPINE WITHOUT CONTRAST TECHNIQUE: Multiplanar, multisequence MR imaging of the cervical spine was performed. No intravenous contrast was administered. COMPARISON:  Partial visualization of cervicomedullary junction on prior MR brain studies. FINDINGS: Alignment: 6 mm anterolisthesis C7 on T1. There is 3 mm degenerative anterolisthesis C2-C3. Otherwise anatomic alignment. Vertebrae: Significant erosion of C2 due to pannus. Cord: Severe cord compression opposite C2, related to pannus. Abnormal cord signal is identified along the lower margin of the cord compression, see series 6, image 9. Posterior Fossa, vertebral arteries, paraspinal tissues: No tonsillar herniation. LEFT vertebral artery is dominant. RIGHT vertebral is occluded. Disc levels: C1-C2: Severe pannus, projects posteriorly resulting in cord compression as well as erodes the odontoid. Pannus measures up to 13 mm in anterior-posterior extent. Canal diameter measures 4 mm at its most narrow point. C2-3: 3 mm facet mediated anterolisthesis. No stenosis. BILATERAL C3 foraminal narrowing. C3-4: Central protrusion with osseous spurring. Facet arthropathy. BILATERAL C4 foraminal narrowing. C4-5: Central protrusion with osseous spurring. Facet arthropathy. BILATERAL C5 foraminal narrowing. C5-6: Central protrusion with osseous spurring. Facet arthropathy. BILATERAL C6 foraminal narrowing. C6-7: Central protrusion with osseous spurring. RIGHT greater than LEFT C7 foraminal  narrowing. C7-T1: 6 mm anterolisthesis. Advanced disc space narrowing. Facet arthropathy. BILATERAL C8 foraminal narrowing. 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 Abnormal cord signal is identified. Surgical consultation is warranted. Multilevel spondylosis resulting in foraminal narrowing from C2-3 through C7-T1. See discussion above. Electronically Signed   By: Staci Righter M.D.   On: 05/12/2019 15:46   Ct Head Code Stroke Wo Contrast  Result Date: 05/12/2019 CLINICAL DATA:  Code stroke. Left-sided weakness slurred speech. LV0 positive EXAM: CT HEAD WITHOUT CONTRAST TECHNIQUE: Contiguous axial images were obtained from the base of the skull through the vertex without intravenous contrast. COMPARISON:  MRI head 12/31/2018 FINDINGS: Brain: Moderate atrophy with ventricular enlargement which is stable from prior studies. Negative for acute infarct.  Negative for acute hemorrhage or mass. Vascular: Negative for hyperdense vessel Skull: No skull lesions. Extensive calcified pannus posterior to the dens causing spinal stenosis as noted on prior MRI. Sinuses/Orbits: Mild mucosal edema paranasal sinuses. Bilateral cataract surgery. Other: None ASPECTS ([place] Stroke Program Early CT Score) - Ganglionic level infarction (caudate, lentiform nuclei, internal capsule, insula, M1-M3 cortex): 7 - Supraganglionic infarction (M4-M6 cortex): 3 Total score (0-10 with 10 being normal): 10 IMPRESSION: 1. No acute intracranial abnormality 2. Atrophy and mild chronic microvascular ischemia 3. ASPECTS is 10 4. Severe pannus posterior to the dens causing spinal stenosis and cord compression on the right as noted on prior MRI 5. These results were called by telephone at the time of interpretation on 05/12/2019 at 11:10 am to Dr. Rory Percy, who verbally acknowledged these results. Electronically Signed   By: Franchot Gallo M.D.   On: 05/12/2019 11:13     Medications:  Scheduled: .  stroke: mapping our early stages of recovery book   Does not apply Once  . aspirin EC  81 mg Oral Daily  . atorvastatin  40 mg Oral q1800  . clopidogrel  75 mg Oral Daily  . enoxaparin (LOVENOX) injection  30 mg Subcutaneous Q24H  . latanoprost  1 drop Both Eyes QHS  . levothyroxine  50 mcg Oral QAC breakfast  .  multivitamin with minerals  1 tablet Oral Daily  . pantoprazole  40 mg Oral Daily  . sodium chloride flush  3 mL Intravenous Once   Assessment: 85 y.o. 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 On assessment by Neurology in the ED there was only slight RUE weakness. Emergent CT head showed no acute changes. Due to CKD no CTA/P was done. Had acute decompensation noted at 1:15 AM today in the context of low SBP of 105 from baseline of 120's earlier this shift.  1. Right sided weakness, right facial droop and dysarthria are improving with initial 200 cc of NS bolus and concomitant increase in SBP to 120's 2. Overall presentation most consistent with watershed TIA involving the left MCA/ACA territory. Has severe intracranial atherosclerotic disease on MRA with moderate to severe flow-limiting tandem stenoses, most notable in the basilar artery. High-grade stenosis of the LEFT M1 MCA was also of concern. 3. Acute thrombosis also possible, and partial improvement may be due to improved collateral flow with IV fluid bolus rather than improved direct perfusion via left MCA/ACA. Discussed risks/benefits of CTA head/neck with the patient. I expressed my concern that thrombosis is of high enough likelihood to warrant nephrotoxic risk of IV contrast if thrombectomy may be an option in the case of an acute occlusion. The patient expressed understanding an agreement with the plan.  4. STAT CTA of head  and neck has been ordered. Continuing IV fluid bolus.    Addendum: CTA shows chronic multifocal stenoses and is unchanged relative to prior MRA performed in February. Will hold off on VIR. No indication for heparin. Continue ASA and Plavix. After bolus is complete, continue IVNS at 150 cc/hr for 12 hours. Frequent neuro checks.   60 minutes spent in the emergent neurological evaluation and management of this critically ill patient with abrupt, severe neurological change. Will need MRI in AM to evaluate for possible new watershed foci of infarction. Repeat swallow evaluation.    LOS: 1 day   @Electronically  signed: Dr. Kerney Elbe 05/13/2019  1:39 AM

## 2019-05-13 NOTE — Progress Notes (Addendum)
[facility]                                                                              Patient Demographics  Robert Rowe, is a 85 y.o. male, DOB - 1918-08-24, VEH:[id number]  Admit date - 05/12/2019   Admitting Physician Guilford Shi, MD  Outpatient Primary MD for the patient is Burchette, Alinda Sierras, MD  Outpatient specialists:   LOS - 1  days   Medical records reviewed and are as summarized below:    Chief Complaint  Patient presents with   Code Stroke       Brief summary   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  Patient reported he went to bed feeling okay but woke up in the middle of the night with restlessness, noticed that he could not move his right side well.  Patient also reported numbness in the right side of his face.  Family noted slurred speech next morning and patient was brought to ED with code stroke. Patient had stopped taking both aspirin and Plavix 3 days prior to admission as he noticed bruising along the right knee/thigh area. MRI brain revealed acute CVA in the left paramedian pons, patient was admitted for further work-up.  Neurology was consulted.   Assessment & Plan    Principal Problem: Acute CVA (cerebral vascular accident) ([facility]) -Presented with right-sided weakness, dysarthria, numbness in his right side of the face -MRI of the brain showed acute nonhemorrhagic infarction affecting the left paramedian pons, advanced atrophy and mild small vessel disease. -MRA moderate to severe flow-limiting stenosis in basilar artery, high-grade stenosis of the left M1 MCA. - overnight, patient had rapid response with code stroke, developed right-sided weakness, slurred speech, fascia,  right arm drooping.  Patient was evaluated by stroke team possible left MCA/ACA watershed TIA.  CT angiogram showed nonocclusive thrombus in proximal M1 segment of left MCA, occlusion of inferior division left M2 segment at MCA bifurcation, these findings are unchanged from previous imaging. -Patient was placed on IV fluid hydration, serial neuro checks every 2 hours, aspirin and Plavix.  - LDL 129, on statin, slp evaluation -Significant dysarthria, will repeat SLP evaluation given overnight worsening of neurological deficits. -Follow 2D echo and further recommendations from stroke team  Myelopathy with acute C-spine abnormality, compression of cervical cord C2 -Concerns for myelopathy, patient underwent MRI of the C-spine, which showed severe pannus resulting in compression of the cervical cord, right greater than left opposite the mid C2 level -Neurosurgery consulted, d/w Dr Arnoldo Morale, not likely candidate for any surgery given advanced age, acute CVA, multiple morbidities.    Dysphagia -Significant dysarthria, will repeat SLP evaluation prior to initiating diet   mild AKI on CKD (chronic kidney disease) stage 3, GFR 30-59 ml/min (HCC) -Baseline creatinine 1.4-1.5 -Continue IV fluid hydration    Dyslipidemia -LDL 129, on statins, awaiting SLP evaluation, n.p.o.    GERD Currently on PPI  Chronic diastolic CHF (congestive heart failure) (HCC) -Currently euvolemic, compensated, follow volume status closely with IV fluids    Essential hypertension  -Permissive hypertension due to acute stroke and worsening  Hypothyroidism -will change to IV Synthroid if unable to tolerate diet  Code Status: Full CODE STATUS DVT Prophylaxis: Lovenox Family Communication: Discussed in detail with the patient, all imaging results, lab results explained to the patient and patient's son on the phone.   Disposition Plan: Continue inpatient, overnight decompensated, stroke team to follow.  If no  significant improvement, will address goals of care  Time Spent in minutes 35 minutes  Procedures:  MRI/MRA  Consultants:   Neurology Neurosurgery, Dr. Arnoldo Morale  Antimicrobials:   Anti-infectives (From admission, onward)   None         Medications  Scheduled Meds:   stroke: mapping our early stages of recovery book   Does not apply Once   aspirin EC  81 mg Oral Daily   atorvastatin  40 mg Oral q1800   clopidogrel  75 mg Oral Daily   enoxaparin (LOVENOX) injection  30 mg Subcutaneous Q24H   latanoprost  1 drop Both Eyes QHS   levothyroxine  50 mcg Oral QAC breakfast   multivitamin with minerals  1 tablet Oral Daily   pantoprazole  40 mg Oral Daily   sodium chloride flush  3 mL Intravenous Once   Continuous Infusions:  sodium chloride 150 mL/hr at 05/13/19 0300   PRN Meds:.acetaminophen, albuterol, hydrALAZINE, meclizine, nitroGLYCERIN, senna-docusate, traMADol      Subjective:   [name] was seen and examined today.  Overnight events noted, now unable to move right upper extremity, minimal movement of right lower extremity.  Significant dysarthria.  Difficult to obtain review of system from the patient due to dysarthria.  No vomiting, no chest pain, fevers.   Objective:   Vitals:   05/13/19 0118 05/13/19 0406 05/13/19 0600 05/13/19 0816  BP: 105/60 122/70 (!) 143/81 (!) 153/93  Pulse: 96 91 92 91  Resp:  17 18 18   Temp: (!) 97.5 F (36.4 C) 98.8 F (37.1 C) 98.7 F (37.1 C) (!) 97.5 F (36.4 C)  TempSrc: Oral Oral Oral Oral  SpO2: 93% 93% 94% 94%  Weight:      Height:        Intake/Output Summary (Last 24 hours) at 05/13/2019 1003 Last data filed at 05/13/2019 0448 Gross per 24 hour  Intake 1420 ml  Output 100 ml  Net 1320 ml     Wt Readings from Last 3 Encounters:  05/12/19 66.9 kg  01/17/19 74.8 kg  01/10/19 65.2 kg     Exam  General: Alert and oriented significant dysarthria, follows commands  Eyes: PERRLA,  EOMI  HEENT:    Cardiovascular: S1 S2 auscultated, no rubs, murmurs or gallops. Regular rate and rhythm.  Respiratory: Clear to auscultation bilaterally, no wheezing, rales or rhonchi  Gastrointestinal: Soft, nontender, nondistended, + bowel sounds  Ext: no pedal edema bilaterally  Neuro: Left upper and lower extremity 5/5, right upper extremity flaccid, 0/5, RLE 1/5  musculoskeletal: No digital cyanosis, clubbing  Skin: No rashes  Psych:    Data Reviewed:  I have personally reviewed following labs and imaging studies  Micro Results Recent Results (from the past 240 hour(s))  SARS Coronavirus 2 (CEPHEID - Performed in [place] hospital lab), Hosp Order     Status: None   Collection Time: 05/12/19  3:36 PM   Specimen: Nasopharyngeal Swab  Result Value Ref  Range Status   SARS Coronavirus 2 NEGATIVE NEGATIVE Final    Comment: (NOTE) If result is NEGATIVE SARS-CoV-2 target nucleic acids are NOT DETECTED. The SARS-CoV-2 RNA is generally detectable in upper and lower  respiratory specimens during the acute phase of infection. The lowest  concentration of SARS-CoV-2 viral copies this assay can detect is 250  copies / mL. A negative result does not preclude SARS-CoV-2 infection  and should not be used as the sole basis for treatment or other  patient management decisions.  A negative result may occur with  improper specimen collection / handling, submission of specimen other  than nasopharyngeal swab, presence of viral mutation(s) within the  areas targeted by this assay, and inadequate number of viral copies  (<250 copies / mL). A negative result must be combined with clinical  observations, patient history, and epidemiological information. If result is POSITIVE SARS-CoV-2 target nucleic acids are DETECTED. The SARS-CoV-2 RNA is generally detectable in upper and lower  respiratory specimens dur ing the acute phase of infection.  Positive  results are indicative of active  infection with SARS-CoV-2.  Clinical  correlation with patient history and other diagnostic information is  necessary to determine patient infection status.  Positive results do  not rule out bacterial infection or co-infection with other viruses. If result is PRESUMPTIVE POSTIVE SARS-CoV-2 nucleic acids MAY BE PRESENT.   A presumptive positive result was obtained on the submitted specimen  and confirmed on repeat testing.  While 2019 novel coronavirus  (SARS-CoV-2) nucleic acids may be present in the submitted sample  additional confirmatory testing may be necessary for epidemiological  and / or clinical management purposes  to differentiate between  SARS-CoV-2 and other Sarbecovirus currently known to infect humans.  If clinically indicated additional testing with an alternate test  methodology [phone]) is advised. The SARS-CoV-2 RNA is generally  detectable in upper and lower respiratory sp ecimens during the acute  phase of infection. The expected result is Negative. Fact Sheet for Patients:  StrictlyIdeas.no Fact Sheet for Healthcare Providers: BankingDealers.co.za This test is not yet approved or cleared by the [place] FDA and has been authorized for detection and/or diagnosis of SARS-CoV-2 by FDA under an Emergency Use Authorization (EUA).  This EUA will remain in effect (meaning this test can be used) for the duration of the COVID-19 declaration under Section 564(b)(1) of the Act, 21 U.S.C. section 360bbb-3(b)(1), unless the authorization is terminated or revoked sooner. Performed at [facility], [place] [address], [place], [place] [address]     Radiology Reports Ct Angio Head W Or Wo Contrast  Result Date: 05/13/2019 CLINICAL DATA:  Right-sided weakness EXAM: CT ANGIOGRAPHY HEAD AND NECK TECHNIQUE: Multidetector CT imaging of the head and neck was performed using the standard protocol during bolus administration of  intravenous contrast. Multiplanar CT image reconstructions and MIPs were obtained to evaluate the vascular anatomy. Carotid stenosis measurements (when applicable) are obtained utilizing NASCET criteria, using the distal internal carotid diameter as the denominator. CONTRAST:  70mL OMNIPAQUE IOHEXOL 350 MG/ML SOLN COMPARISON:  Head CT 05/12/2019 FINDINGS: CTA NECK FINDINGS SKELETON: Large pannus at C2 that causes spinal canal stenosis and compression of the spinal cord as demonstrated on previous MRI. OTHER NECK: Normal pharynx, larynx and major salivary glands. No cervical lymphadenopathy. Unremarkable thyroid gland. UPPER CHEST: No pneumothorax or pleural effusion. No nodules or masses. AORTIC ARCH: There is mild calcific atherosclerosis of the aortic arch. There is no aneurysm, dissection or hemodynamically significant stenosis  of the visualized ascending aorta and aortic arch. Conventional 3 vessel aortic branching pattern. The visualized proximal subclavian arteries are widely patent. RIGHT CAROTID SYSTEM: --Common carotid artery: There is noncalcified plaque within the right common carotid artery with less than 50% stenosis. --Internal carotid artery: No dissection, occlusion or aneurysm. There is mixed density atherosclerosis extending into the proximal ICA, resulting in 50% stenosis. --External carotid artery: No acute abnormality. LEFT CAROTID SYSTEM: --Common carotid artery: Widely patent origin without common carotid artery dissection or aneurysm. --Internal carotid artery: No dissection, occlusion or aneurysm. Mild atherosclerotic calcification at the carotid bifurcation without hemodynamically significant stenosis. --External carotid artery: No acute abnormality. VERTEBRAL ARTERIES: Left dominant configuration. Right vertebral artery origin is occluded, but most of the V1 segment is opacified. There is moderate stenosis of the left vertebral artery origin. The left V1-V3 segments are normal. The right  vertebral artery is diffusely diminutive with long segment loss of opacification beginning at the C5 level. CTA HEAD FINDINGS POSTERIOR CIRCULATION: --Vertebral arteries: Severe stenosis of the left V4 segment due to predominantly calcified plaque. The right V4 segment is diminutive and only intermittently opacified. --Posterior inferior cerebellar arteries (PICA): Patent origins from the vertebral arteries. The right PICA may be supplied via collateral flow from the left side. --Anterior inferior cerebellar arteries (AICA): Not clearly visualize --Basilar artery: Severe stenosis of the midportion of the basilar artery (12:24) --Superior cerebellar arteries: Patent proximally --Posterior cerebral arteries (PCA): Posterior communicating arteries are diminutive or absent. There is multifocal severe stenosis of the right PCA, which remains patent. Stenosis of the left PCA are greatest at the proximal P2 segment. ANTERIOR CIRCULATION: --Intracranial internal carotid arteries: Atherosclerotic calcification of the internal carotid arteries at the skull base without hemodynamically significant stenosis. --Anterior cerebral arteries (ACA): Normal. Absent right A1 segment, normal variant --Middle cerebral arteries (MCA): There is focal hypoattenuation of the proximal left M1 segment, likely indicating nonocclusive thrombus (series 8, image 107, series 11, image 20). The inferior M2 division of the left MCA is occluded beginning at the MCA bifurcation (series 10, images 140 3-147), extending for a length of approximately 7 mm. The distal branches of the left MCA are patent. VENOUS SINUSES: As permitted by contrast timing, patent. ANATOMIC VARIANTS: None Review of the MIP images confirms the above findings. IMPRESSION: 1. Nonocclusive thrombus within the proximal M1 segment of the left middle cerebral artery, with occlusion of the inferior division left M2 segment beginning at the MCA bifurcation and extending over length of  approximately 7 mm. Compared to the MRI/MRA of 12/31/2018, these findings are unchanged. These results were called by telephone at the time of interpretation on 05/13/2019 at 2:40 am to Dr. Kerney Elbe , who verbally acknowledged these results. 2. 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 3. Severe stenosis of the basilar artery and multifocal bilateral posterior cerebral artery severe stenoses, also unchanged. 4. Approximately 50% stenosis of the proximal right internal carotid artery which is otherwise normal. 5. Large amount of pannus dorsal to the odontoid process with spinal canal stenosis, better characterized on recent MRI. Electronically Signed   By: Ulyses Jarred M.D.   On: 05/13/2019 02:43   Ct Angio Neck W Or Wo Contrast  Result Date: 05/13/2019 CLINICAL DATA:  Right-sided weakness EXAM: CT ANGIOGRAPHY HEAD AND NECK TECHNIQUE: Multidetector CT imaging of the head and neck was performed using the standard protocol during bolus administration of intravenous contrast. Multiplanar CT  image reconstructions and MIPs were obtained to evaluate the vascular anatomy. Carotid stenosis measurements (when applicable) are obtained utilizing NASCET criteria, using the distal internal carotid diameter as the denominator. CONTRAST:  43mL OMNIPAQUE IOHEXOL 350 MG/ML SOLN COMPARISON:  Head CT 05/12/2019 FINDINGS: CTA NECK FINDINGS SKELETON: Large pannus at C2 that causes spinal canal stenosis and compression of the spinal cord as demonstrated on previous MRI. OTHER NECK: Normal pharynx, larynx and major salivary glands. No cervical lymphadenopathy. Unremarkable thyroid gland. UPPER CHEST: No pneumothorax or pleural effusion. No nodules or masses. AORTIC ARCH: There is mild calcific atherosclerosis of the aortic arch. There is no aneurysm, dissection or hemodynamically significant stenosis of the visualized ascending aorta and aortic arch.  Conventional 3 vessel aortic branching pattern. The visualized proximal subclavian arteries are widely patent. RIGHT CAROTID SYSTEM: --Common carotid artery: There is noncalcified plaque within the right common carotid artery with less than 50% stenosis. --Internal carotid artery: No dissection, occlusion or aneurysm. There is mixed density atherosclerosis extending into the proximal ICA, resulting in 50% stenosis. --External carotid artery: No acute abnormality. LEFT CAROTID SYSTEM: --Common carotid artery: Widely patent origin without common carotid artery dissection or aneurysm. --Internal carotid artery: No dissection, occlusion or aneurysm. Mild atherosclerotic calcification at the carotid bifurcation without hemodynamically significant stenosis. --External carotid artery: No acute abnormality. VERTEBRAL ARTERIES: Left dominant configuration. Right vertebral artery origin is occluded, but most of the V1 segment is opacified. There is moderate stenosis of the left vertebral artery origin. The left V1-V3 segments are normal. The right vertebral artery is diffusely diminutive with long segment loss of opacification beginning at the C5 level. CTA HEAD FINDINGS POSTERIOR CIRCULATION: --Vertebral arteries: Severe stenosis of the left V4 segment due to predominantly calcified plaque. The right V4 segment is diminutive and only intermittently opacified. --Posterior inferior cerebellar arteries (PICA): Patent origins from the vertebral arteries. The right PICA may be supplied via collateral flow from the left side. --Anterior inferior cerebellar arteries (AICA): Not clearly visualize --Basilar artery: Severe stenosis of the midportion of the basilar artery (12:24) --Superior cerebellar arteries: Patent proximally --Posterior cerebral arteries (PCA): Posterior communicating arteries are diminutive or absent. There is multifocal severe stenosis of the right PCA, which remains patent. Stenosis of the left PCA are greatest  at the proximal P2 segment. ANTERIOR CIRCULATION: --Intracranial internal carotid arteries: Atherosclerotic calcification of the internal carotid arteries at the skull base without hemodynamically significant stenosis. --Anterior cerebral arteries (ACA): Normal. Absent right A1 segment, normal variant --Middle cerebral arteries (MCA): There is focal hypoattenuation of the proximal left M1 segment, likely indicating nonocclusive thrombus (series 8, image 107, series 11, image 20). The inferior M2 division of the left MCA is occluded beginning at the MCA bifurcation (series 10, images 140 3-147), extending for a length of approximately 7 mm. The distal branches of the left MCA are patent. VENOUS SINUSES: As permitted by contrast timing, patent. ANATOMIC VARIANTS: None Review of the MIP images confirms the above findings. IMPRESSION: 1. Nonocclusive thrombus within the proximal M1 segment of the left middle cerebral artery, with occlusion of the inferior division left M2 segment beginning at the MCA bifurcation and extending over length of approximately 7 mm. Compared to the MRI/MRA of 12/31/2018, these findings are unchanged. These results were called by telephone at the time of interpretation on 05/13/2019 at 2:40 am to Dr. Kerney Elbe , who verbally acknowledged these results. 2. 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 3. Severe stenosis of the basilar artery and multifocal bilateral posterior cerebral artery severe stenoses, also unchanged. 4. Approximately 50% stenosis of the proximal right internal carotid artery which is otherwise normal. 5. Large amount of pannus dorsal to the odontoid process with spinal canal stenosis, better characterized on recent MRI. Electronically Signed   By: Ulyses Jarred M.D.   On: 05/13/2019 02:43   Mr Angio Head Wo Contrast  Result Date: 05/12/2019 CLINICAL DATA:  RIGHT-sided weakness and slurred  speech. EXAM: MRI HEAD WITHOUT CONTRAST MRA HEAD WITHOUT CONTRAST TECHNIQUE: Multiplanar, multiecho pulse sequences of the brain and surrounding structures were obtained without intravenous contrast. Angiographic images of the head were obtained using MRA technique without contrast. COMPARISON:  CT head earlier today. MR head 12/31/2018. MRI cervical spine described separately. FINDINGS: MRI HEAD FINDINGS Brain: There is a cluster of punctate foci, restricted diffusion, LEFT paramedian pons, corresponding low ADC, consistent with acute infarction. No hemorrhage, mass lesion, or extra-axial fluid. Advanced atrophy, not unexpected for age, with hydrocephalus ex vacuo. Mild T2 and FLAIR hyperintensities in the white matter, likely small vessel disease. Vascular: Reported separately. Skull and upper cervical spine: Normal marrow signal. There is extreme pannus surrounding the odontoid, with severe cervicomedullary compression. Sinuses/Orbits: No acute findings.  BILATERAL cataract extraction. Other: No mastoid fluid. MRA HEAD FINDINGS RIGHT ICA: Tandem 50% stenoses in the cavernous segment. Cervical and petrous segments widely patent. Mild narrowing of the supraclinoid ICA, with widely patent terminus. LEFT ICA: 50% stenosis of the cavernous/supraclinoid ICA junction. No flow reducing stenoses more distally. ICA terminus widely patent. RIGHT MCA: 50-75% stenosis in its mid M1 segment. The MCA bifurcation demonstrates mild to moderate disease affecting the M2 segments and beyond. LEFT MCA: Critical stenosis proximal M1 segment, 5 mm segment of diminished to absent flow related enhancement. Poor flow related enhancement of MCA vessels distally particularly the LEFT M2 inferior division. LEFT ACA: Dominant segment. Disc is rise to both distal anterior cerebral arteries RIGHT ACA: Atretic or occluded. Basilar artery: Multiple segmental stenoses, at least two of which are significantly flow reducing. 75% stenosis in the  proximal basilar. 75-90% stenosis in the distal basilar. 50-75% stenosis in the mid basilar. LEFT vertebral: Widely patent. RIGHT vertebral: Severely diseased, occluded at skull base. High-grade stenosis distal V4 segment, likely retrograde filling. Posterior cerebral arteries: Severe BILATERAL flow-limiting stenoses, 75-90% in the P1-P2 junctions. Beyond this, BILATERAL segmental PCA stenoses likely flow reducing, greater on the RIGHT. Cerebellar branches: Poorly seen. IMPRESSION: Acute nonhemorrhagic infarction affecting the LEFT paramedian pons. Advanced atrophy and mild small vessel disease. Widespread intracranial atherosclerotic disease, with moderate to severe flow-limiting tandem stenoses, most notable in the basilar artery. High-grade stenosis of the LEFT M1 MCA is also of concern. See discussion above. Electronically Signed   By: Staci Righter M.D.   On: 05/12/2019 15:36   Mr Brain Wo Contrast  Result Date: 05/12/2019 CLINICAL DATA:  RIGHT-sided weakness and slurred speech. EXAM: MRI HEAD WITHOUT CONTRAST MRA HEAD WITHOUT CONTRAST TECHNIQUE: Multiplanar, multiecho pulse sequences of the brain and surrounding structures were obtained without intravenous contrast. Angiographic images of the head were obtained using MRA technique without contrast. COMPARISON:  CT head earlier today. MR head 12/31/2018. MRI cervical spine described separately. FINDINGS: MRI HEAD FINDINGS Brain: There is a cluster of punctate foci, restricted diffusion, LEFT paramedian pons, corresponding low ADC, consistent with acute infarction. No hemorrhage, mass lesion, or extra-axial fluid. Advanced atrophy, not unexpected for age, with hydrocephalus ex vacuo. Mild T2  and FLAIR hyperintensities in the white matter, likely small vessel disease. Vascular: Reported separately. Skull and upper cervical spine: Normal marrow signal. There is extreme pannus surrounding the odontoid, with severe cervicomedullary compression. Sinuses/Orbits:  No acute findings.  BILATERAL cataract extraction. Other: No mastoid fluid. MRA HEAD FINDINGS RIGHT ICA: Tandem 50% stenoses in the cavernous segment. Cervical and petrous segments widely patent. Mild narrowing of the supraclinoid ICA, with widely patent terminus. LEFT ICA: 50% stenosis of the cavernous/supraclinoid ICA junction. No flow reducing stenoses more distally. ICA terminus widely patent. RIGHT MCA: 50-75% stenosis in its mid M1 segment. The MCA bifurcation demonstrates mild to moderate disease affecting the M2 segments and beyond. LEFT MCA: Critical stenosis proximal M1 segment, 5 mm segment of diminished to absent flow related enhancement. Poor flow related enhancement of MCA vessels distally particularly the LEFT M2 inferior division. LEFT ACA: Dominant segment. Disc is rise to both distal anterior cerebral arteries RIGHT ACA: Atretic or occluded. Basilar artery: Multiple segmental stenoses, at least two of which are significantly flow reducing. 75% stenosis in the proximal basilar. 75-90% stenosis in the distal basilar. 50-75% stenosis in the mid basilar. LEFT vertebral: Widely patent. RIGHT vertebral: Severely diseased, occluded at skull base. High-grade stenosis distal V4 segment, likely retrograde filling. Posterior cerebral arteries: Severe BILATERAL flow-limiting stenoses, 75-90% in the P1-P2 junctions. Beyond this, BILATERAL segmental PCA stenoses likely flow reducing, greater on the RIGHT. Cerebellar branches: Poorly seen. IMPRESSION: Acute nonhemorrhagic infarction affecting the LEFT paramedian pons. Advanced atrophy and mild small vessel disease. Widespread intracranial atherosclerotic disease, with moderate to severe flow-limiting tandem stenoses, most notable in the basilar artery. High-grade stenosis of the LEFT M1 MCA is also of concern. See discussion above. Electronically Signed   By: Staci Righter M.D.   On: 05/12/2019 15:36   Mr Cervical Spine Wo Contrast  Result Date:  05/12/2019 CLINICAL DATA:  C-spine trauma, myelopathy. EXAM: MRI CERVICAL SPINE WITHOUT CONTRAST TECHNIQUE: Multiplanar, multisequence MR imaging of the cervical spine was performed. No intravenous contrast was administered. COMPARISON:  Partial visualization of cervicomedullary junction on prior MR brain studies. FINDINGS: Alignment: 6 mm anterolisthesis C7 on T1. There is 3 mm degenerative anterolisthesis C2-C3. Otherwise anatomic alignment. Vertebrae: Significant erosion of C2 due to pannus. Cord: Severe cord compression opposite C2, related to pannus. Abnormal cord signal is identified along the lower margin of the cord compression, see series 6, image 9. Posterior Fossa, vertebral arteries, paraspinal tissues: No tonsillar herniation. LEFT vertebral artery is dominant. RIGHT vertebral is occluded. Disc levels: C1-C2: Severe pannus, projects posteriorly resulting in cord compression as well as erodes the odontoid. Pannus measures up to 13 mm in anterior-posterior extent. Canal diameter measures 4 mm at its most narrow point. C2-3: 3 mm facet mediated anterolisthesis. No stenosis. BILATERAL C3 foraminal narrowing. C3-4: Central protrusion with osseous spurring. Facet arthropathy. BILATERAL C4 foraminal narrowing. C4-5: Central protrusion with osseous spurring. Facet arthropathy. BILATERAL C5 foraminal narrowing. C5-6: Central protrusion with osseous spurring. Facet arthropathy. BILATERAL C6 foraminal narrowing. C6-7: Central protrusion with osseous spurring. RIGHT greater than LEFT C7 foraminal narrowing. C7-T1: 6 mm anterolisthesis. Advanced disc space narrowing. Facet arthropathy. BILATERAL C8 foraminal narrowing. 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 Abnormal cord signal is identified. Surgical consultation is warranted. Multilevel spondylosis resulting in foraminal narrowing from C2-3 through C7-T1. See discussion above.  Electronically Signed   By: Staci Righter M.D.   On: 05/12/2019 15:46   Ct  Head Code Stroke Wo Contrast  Result Date: 05/12/2019 CLINICAL DATA:  Code stroke. Left-sided weakness slurred speech. LV0 positive EXAM: CT HEAD WITHOUT CONTRAST TECHNIQUE: Contiguous axial images were obtained from the base of the skull through the vertex without intravenous contrast. COMPARISON:  MRI head 12/31/2018 FINDINGS: Brain: Moderate atrophy with ventricular enlargement which is stable from prior studies. Negative for acute infarct.  Negative for acute hemorrhage or mass. Vascular: Negative for hyperdense vessel Skull: No skull lesions. Extensive calcified pannus posterior to the dens causing spinal stenosis as noted on prior MRI. Sinuses/Orbits: Mild mucosal edema paranasal sinuses. Bilateral cataract surgery. Other: None ASPECTS ([place] Stroke Program Early CT Score) - Ganglionic level infarction (caudate, lentiform nuclei, internal capsule, insula, M1-M3 cortex): 7 - Supraganglionic infarction (M4-M6 cortex): 3 Total score (0-10 with 10 being normal): 10 IMPRESSION: 1. No acute intracranial abnormality 2. Atrophy and mild chronic microvascular ischemia 3. ASPECTS is 10 4. Severe pannus posterior to the dens causing spinal stenosis and cord compression on the right as noted on prior MRI 5. These results were called by telephone at the time of interpretation on 05/12/2019 at 11:10 am to Dr. Rory Percy, who verbally acknowledged these results. Electronically Signed   By: Franchot Gallo M.D.   On: 05/12/2019 11:13    Lab Data:  CBC: Recent Labs  Lab 05/12/19 1032 05/12/19 1100  WBC 7.7  --   NEUTROABS 4.7  --   HGB 14.1 14.6  HCT 44.0 43.0  MCV 98.7  --   PLT 232  --    Basic Metabolic Panel: Recent Labs  Lab 05/12/19 1032 05/12/19 1100  NA 138 139  K 4.3 4.3  CL 104 105  CO2 24  --   GLUCOSE 132* 124*  BUN 19 20  CREATININE 1.57* 1.60*  CALCIUM 9.6  --    GFR: Estimated Creatinine Clearance: 21.7  mL/min (A) (by C-G formula based on SCr of 1.6 mg/dL (H)). Liver Function Tests: Recent Labs  Lab 05/12/19 1032  AST 15  ALT 11  ALKPHOS 79  BILITOT 0.7  PROT 6.1*  ALBUMIN 3.6   No results for input(s): LIPASE, AMYLASE in the last 168 hours. No results for input(s): AMMONIA in the last 168 hours. Coagulation Profile: Recent Labs  Lab 05/12/19 1032  INR 1.1   Cardiac Enzymes: No results for input(s): CKTOTAL, CKMB, CKMBINDEX, TROPONINI in the last 168 hours. BNP (last 3 results) No results for input(s): PROBNP in the last 8760 hours. HbA1C: No results for input(s): HGBA1C in the last 72 hours. CBG: Recent Labs  Lab 05/12/19 1049 05/13/19 0145  GLUCAP 114* 97   Lipid Profile: Recent Labs    05/13/19 0632  CHOL 203*  HDL 29*  LDLCALC 129*  TRIG 226*  CHOLHDL 7.0   Thyroid Function Tests: No results for input(s): TSH, T4TOTAL, FREET4, T3FREE, THYROIDAB in the last 72 hours. Anemia Panel: No results for input(s): VITAMINB12, FOLATE, FERRITIN, TIBC, IRON, RETICCTPCT in the last 72 hours. Urine analysis:    Component Value Date/Time   COLORURINE YELLOW 01/13/2019 0027   APPEARANCEUR CLEAR 01/13/2019 0027   LABSPEC 1.025 01/13/2019 0027   PHURINE 6.0 01/13/2019 0027   GLUCOSEU NEGATIVE 01/13/2019 0027   HGBUR NEGATIVE 01/13/2019 0027   BILIRUBINUR NEGATIVE 01/13/2019 0027   KETONESUR NEGATIVE 01/13/2019 0027   PROTEINUR NEGATIVE 01/13/2019 0027   UROBILINOGEN 0.2 08/27/2015 1930   NITRITE NEGATIVE 01/13/2019 0027   LEUKOCYTESUR NEGATIVE 01/13/2019 0027     Shabana Armentrout M.D. [facility] 05/13/2019, 10:03 AM  Pager: [phone] Between 7am to 7pm - call Pager - [phone]  After 7pm go to [email or web address] - password [email or web address]  Call night coverage person covering after 7pm

## 2019-05-13 NOTE — Evaluation (Signed)
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 He presented 6/21 with complaints of right-sided weakness and slurred speech beginning the night before.  MRI revealed acute nonhemorrhagic infarct of L paramedian pons. Pt with acute decompensation early morning 6/22 with increased R sided weakness. Follow-up MRI ordered.   Clinical Impression  Pt admitted with above diagnosis. Pt currently with functional limitations due to the deficits listed below (see PT Problem List). At the time of PT eval pt was able to perform transfers with up to +2 max assist for balance support and safety without an AD. Bed pad utilized for assist with scooting, and power-up to full stand. Pt voicing frustration with current level of function, and desire to get back to PLOF to be at home with his wife. At this time, I do not feel pt is appropriate for CIR level therapies, however if he shows improvement in tolerance for functional activity, would like to have a Rehab Admissions Coordinator screen him for appropriateness. Session somewhat limited by ECHO but will continue to follow and progress as able. Acutely, pt will benefit from skilled PT to increase their independence and safety with mobility to allow discharge to the venue listed below.       Follow Up Recommendations SNF;Supervision/Assistance - 24 hour    Equipment Recommendations  None recommended by PT(TBD by next venue of care)    Recommendations for Other Services       Precautions / Restrictions Precautions Precautions: Fall Restrictions Weight Bearing Restrictions: No      Mobility  Bed Mobility Overal bed mobility: Needs  Assistance Bed Mobility: Rolling;Sidelying to Sit;Sit to Supine Rolling: Max assist Sidelying to sit: +2 for physical assistance;Max assist   Sit to supine: +2 for physical assistance;Total assist   General bed mobility comments: Max assist to +2 assist required for all aspects of bed mobility. Hand-over-hand assist to reach for the railing with LUE.   Transfers Overall transfer level: Needs assistance Equipment used: 2 person hand held assist Transfers: Sit to/from Stand Sit to Stand: Max assist;+2 physical assistance         General transfer comment: Attempted sit<> stand x3. Pt required +2 assist and bed pad under hips to facilitate full stand. On second and third attempt, therapists scooted hips towards [place] in preparation for returning to supine. Pt not able to advance feet.   Ambulation/Gait             General Gait Details: Unable to progress to gait training this session.   Stairs            Wheelchair Mobility    Modified Rankin (Stroke Patients Only) Modified Rankin (Stroke Patients Only) Pre-Morbid Rankin Score: Moderate disability Modified Rankin: Severe disability     Balance Overall balance assessment: Needs assistance Sitting-balance support: Feet supported;Single extremity supported Sitting balance-Leahy Scale: Poor Sitting balance - Comments: assist required at EOB ranging from supervision to mod assist.  Postural control: Posterior lean Standing balance support: Bilateral upper extremity supported;During functional activity Standing balance-Leahy Scale: Zero Standing balance comment: +2 assist required.  Pertinent Vitals/Pain Pain Assessment: Faces Faces Pain Scale: No hurt    Home Living Family/patient expects to be discharged to:: Private residence Living Arrangements: Spouse/significant other Available Help at Discharge: Family;Available 24 hours/day Type of Home: House Home Access: Stairs to  enter Entrance Stairs-Rails: Psychiatric nurse of Steps: 8 Home Layout: Two level Home Equipment: Walker - 2 wheels;Tub bench      Prior Function Level of Independence: Needs assistance   Gait / Transfers Assistance Needed: RW  ADL's / Homemaking Assistance Needed: Has an aide coming in 1 hour a day 3 days a week        Hand Dominance   Dominant Hand: Right    Extremity/Trunk Assessment   Upper Extremity Assessment Upper Extremity Assessment: RUE deficits/detail RUE Deficits / Details: no active shoulder movement, able to complete elbow flexion to 90* extension to neutral, minimal wrist flexion and loose gross grasp RUE Coordination: decreased fine motor;decreased gross motor    Lower Extremity Assessment Lower Extremity Assessment: RLE deficits/detail RLE Deficits / Details: Generalized weakness bilaterally with increased weakness and active movement noted in the RLE.     Cervical / Trunk Assessment Cervical / Trunk Assessment: Other exceptions Cervical / Trunk Exceptions: Forward head posture with rounded shoulders  Communication   Communication: HOH  Cognition Arousal/Alertness: Awake/alert Behavior During Therapy: WFL for tasks assessed/performed Overall Cognitive Status: No family/caregiver present to determine baseline cognitive functioning                                        General Comments      Exercises     Assessment/Plan    PT Assessment Patient needs continued PT services  PT Problem List Decreased strength;Decreased activity tolerance;Decreased balance;Decreased mobility;Decreased range of motion;Decreased coordination;Decreased knowledge of use of DME;Decreased safety awareness;Decreased cognition;Decreased knowledge of precautions       PT Treatment Interventions DME instruction;Gait training;Functional mobility training;Therapeutic activities;Therapeutic exercise;Neuromuscular re-education;Patient/family  education;Cognitive remediation    PT Goals (Current goals can be found in the Care Plan section)  Acute Rehab PT Goals Patient Stated Goal: to use Rt hand PT Goal Formulation: With patient Time For Goal Achievement: 05/27/19 Potential to Achieve Goals: Fair    Frequency Min 3X/week   Barriers to discharge Decreased caregiver support Requiring +2 assist at this time.     Co-evaluation PT/OT/SLP Co-Evaluation/Treatment: Yes Reason for Co-Treatment: Complexity of the patient's impairments (multi-system involvement);For patient/therapist safety;To address functional/ADL transfers PT goals addressed during session: Mobility/safety with mobility;Balance;Strengthening/ROM OT goals addressed during session: ADL's and self-care       AM-PAC PT "6 Clicks" Mobility  Outcome Measure Help needed turning from your back to your side while in a flat bed without using bedrails?: A Lot Help needed moving from lying on your back to sitting on the side of a flat bed without using bedrails?: A Lot Help needed moving to and from a bed to a chair (including a wheelchair)?: Total Help needed standing up from a chair using your arms (e.g., wheelchair or bedside chair)?: A Lot Help needed to walk in hospital room?: Total Help needed climbing 3-5 steps with a railing? : Total 6 Click Score: 9    End of Session Equipment Utilized During Treatment: Gait belt Activity Tolerance: Patient limited by fatigue Patient left: in bed;with call bell/phone within reach;with bed alarm set;Other (comment)(ECHO tech in room) Nurse Communication: Mobility status PT Visit Diagnosis:  Unsteadiness on feet (R26.81);Muscle weakness (generalized) (M62.81);Other symptoms and signs involving the nervous system (R29.898);Hemiplegia and hemiparesis Hemiplegia - Right/Left: Right Hemiplegia - dominant/non-dominant: Dominant Hemiplegia - caused by: Cerebral infarction    Time: 0940-1007 PT Time Calculation (min) (ACUTE ONLY):  27 min   Charges:   PT Evaluation $PT Eval Moderate Complexity: 1 Mod          Rolinda Roan, PT, DPT Acute Rehabilitation Services Pager: [phone] Office: [phone]   Thelma Comp 05/13/2019, 1:26 PM

## 2019-05-13 NOTE — Evaluation (Signed)
Clinical/Bedside Swallow Evaluation Patient Details  Name: Robert Rowe MRN: [id number] Date of Birth: 06/22/1918  Today's Date: 05/13/2019 Time: SLP Start Time (ACUTE ONLY): 1135 SLP Stop Time (ACUTE ONLY): 1212 SLP Time Calculation (min) (ACUTE ONLY): 37 min  Past Medical History:  Past Medical History:  Diagnosis Date  . Acute cholecystitis 12/2018  . BENIGN PROSTATIC HYPERTROPHY 04/05/2007  . 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 CHEST DISCOMFORT 04/25/2009  . Chronic diastolic CHF (congestive heart failure) ([place])    a. Echo 12/16: mild LVH, EF 55-60%, no RWMA, Gr 1 DD, mild AI, MAC, mild LAE, normal RVF, PASP 37 mmHg  . Chronic kidney disease    chronic   . DJD (degenerative joint disease)   . Dysrhythmia    pvc's   . GERD 12/03/2007  . Hiatal hernia   . History of nuclear stress test    Myoview 1/17: EF 53%, normal perfusion; Low Risk  . HYPERLIPIDEMIA 04/05/2007  . Hypothyroidism   . Osteoarth NOS-Unspec 04/05/2007  . Palpitations 02/12/2010  . PEDAL EDEMA 05/01/2008  . Rheumatoid arthritis(714.0) 04/05/2007  . Shortness of breath dyspnea   . TRANSIENT ISCHEMIC ATTACK, HX OF 04/05/2007   ?    Past Surgical History:  Past Surgical History:  Procedure Laterality Date  . BALLOON DILATION N/A 03/12/2013   Procedure: BALLOON DILATION;  Surgeon: Inda Castle, MD;  Location: [facility];  Service: Endoscopy;  Laterality: N/A;  . CARPAL TUNNEL RELEASE  11/28/2018   Left hand  . CATARACT EXTRACTION  2001   bilateral  . CYSTOSCOPY WITH URETHRAL DILATATION N/A 09/24/2015   Procedure: CYSTOSCOPY WITH COOK BALLOON DILATATION OF URETHRAL STRICTURE ;  Surgeon: Carolan Clines, MD;  Location: [facility];  Service: Urology;  Laterality: N/A;  . CYSTOSCOPY WITH URETHRAL DILATATION N/A 07/20/2016   Procedure: CYSTOSCOPY WITH URETHRAL DILATATION;  Surgeon: Alexis Frock, MD;  Location: [facility];  Service: Urology;  Laterality: N/A;  1 HOUR  . ESOPHAGOGASTRODUODENOSCOPY  N/A 03/12/2013   Procedure: ESOPHAGOGASTRODUODENOSCOPY (EGD);  Surgeon: Inda Castle, MD;  Location: [facility];  Service: Endoscopy;  Laterality: N/A;  . HEMORRHOID SURGERY    . HERNIA REPAIR     inguinal x5  . KNEE SURGERY  arthroscopic   left  . PROSTATE CRYOABLATION     2000  . SHOULDER SURGERY  few years ago   left  . 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  Patient reported he went to bed feeling okay but woke up in the middle of the night with restlessness, noticed that he could not move his right side well.  Patient also reported numbness in the right side of his face.  Family noted slurred speech next morning and patient was brought to ED with code stroke. MRI brain revealed acute CVA in the left paramedian pons, patient was admitted for further work-up. Pt had worsening deficits overnight.   Assessment / Plan / Recommendation Clinical Impression  Pt tolerated all consistencies trialed with no clinical s/s of aspiration and exhibited good oral clearance  of solids.  Oral phase was significantly more efficient with dentures in place.  Dentures improved dysarthria as well.  Pt's voice is mildly gravelly/harsh at baseline, and vocal quality remained consistent with PO trials. Pt is managing secretions without difficulty. There is no chest imaging available at this time.   Recommend regular texture diet with thin liquid.  If pt has any difficulty with po intake, or there is concern for silent aspiration, please make pt NPO  and reconsult speech therapy.     Pt endorses changes to speech and difficulty with word finding, although he was able to engage in conversation during evaluation.  Pt was very active prior this (he only stopped golfing at 8) and is aware of and frustrated by changes following stroke. SLP Visit Diagnosis: Dysphagia, oropharyngeal phase (R13.12)    Aspiration Risk  Mild aspiration risk    Diet Recommendation Regular   Liquid Administration via: Cup;Straw Medication Administration: Whole meds with liquid Supervision: Staff to assist with self feeding Compensations: Slow rate;Small sips/bites Postural Changes: Seated upright at 90 degrees    Other  Recommendations Oral Care Recommendations: Oral care BID   Follow up Recommendations   TBD     Frequency and Duration min 2x/week  1 week       Prognosis   Good with continued recovery    Swallow Study   General Date of Onset: 05/12/19 HPI: 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  Patient reported he went to bed feeling okay but woke up in the middle of the night with restlessness, noticed that he could not move his right side well.  Patient also reported numbness in the right side of his face.  Family noted slurred speech next morning and patient was brought to ED with code stroke. MRI brain revealed acute CVA in the left paramedian pons, patient was admitted for further work-up. Pt had worsening deficits overnight. Type of Study: Bedside Swallow Evaluation Diet Prior to this Study: NPO Temperature Spikes Noted: No Behavior/Cognition: Alert;Cooperative;Pleasant mood Oral Cavity Assessment: Within Functional Limits Oral Cavity - Dentition: Dentures, top Vision: Functional for  self-feeding Self-Feeding Abilities: Needs assist Patient Positioning: Upright in bed Baseline Vocal Quality: (gravelly) Volitional Cough: Strong Volitional Swallow: Able to elicit    Oral/Motor/Sensory Function Overall Oral Motor/Sensory Function: Mild impairment Facial ROM: Reduced right Facial Symmetry: Abnormal symmetry right Facial Strength: Reduced right Lingual ROM: Reduced left Lingual Strength: Reduced Velum: Within Functional Limits Mandible: Within Functional Limits   Ice Chips Ice chips: Not tested   Thin Liquid Thin Liquid: Within functional limits    Nectar Thick Nectar Thick Liquid: Not tested   Honey Thick Honey Thick Liquid: Not tested   Puree Puree: Within functional limits   Solid     Solid: Within functional limits      Celedonio Savage, [place], [place] Office: [phone]; Pager (6/22): [phone] 05/13/2019,12:53 PM

## 2019-05-13 NOTE — Code Documentation (Addendum)
Responded to code stroke called at [facility]. Pt developed R sided weakness/droop, slurred speech, and mild aphasia.  Pt [id number], CBG-97. NIH-10. [facility] called to bedside PTA RRT and called a code stroke. CT head done previously when pt came in as a code stroke at 1057 6/21 so CTA ordered by [facility] (previous NIH-2 for dysarthria and R arm droop). CTA-chronic findings consistent with previous MRI/MRA in Feburary. Plan: ASA/Plavix, IVF, neuro checks.

## 2019-05-14 DIAGNOSIS — R1312 Dysphagia, oropharyngeal phase: Secondary | ICD-10-CM

## 2019-05-14 LAB — BASIC METABOLIC PANEL
Anion gap: 7 (ref 5–15)
BUN: 14 mg/dL (ref 8–23)
CO2: 22 mmol/L (ref 22–32)
Calcium: 8.5 mg/dL — ABNORMAL LOW (ref 8.9–10.3)
Chloride: 116 mmol/L — ABNORMAL HIGH (ref 98–111)
Creatinine, Ser: 1.4 mg/dL — ABNORMAL HIGH (ref 0.61–1.24)
GFR calc Af Amer: 48 mL/min — ABNORMAL LOW (ref >60)
GFR calc non Af Amer: 42 mL/min — ABNORMAL LOW (ref >60)
Glucose, Bld: 96 mg/dL (ref 70–99)
Potassium: 3.8 mmol/L (ref 3.5–5.1)
Sodium: 145 mmol/L (ref 135–145)

## 2019-05-14 LAB — HEMOGLOBIN A1C
Hgb A1c MFr Bld: 6 % — ABNORMAL HIGH (ref 4.8–5.6)
Mean Plasma Glucose: 126 mg/dL

## 2019-05-14 LAB — CBC
HCT: 39.7 % (ref 39.0–52.0)
Hemoglobin: 12.9 g/dL — ABNORMAL LOW (ref 13.0–17.0)
MCH: 31.8 pg (ref 26.0–34.0)
MCHC: 32.5 g/dL (ref 30.0–36.0)
MCV: 97.8 fL (ref 80.0–100.0)
Platelets: 192 10*3/uL (ref 150–400)
RBC: 4.06 MIL/uL — ABNORMAL LOW (ref 4.22–5.81)
RDW: 13.1 % (ref 11.5–15.5)
WBC: 6.2 10*3/uL (ref 4.0–10.5)
nRBC: 0 % (ref 0.0–0.2)

## 2019-05-14 MED ORDER — SODIUM CHLORIDE 0.9 % IV SOLN
INTRAVENOUS | Status: AC
  Administered 2019-05-14 (×2): via INTRAVENOUS

## 2019-05-14 MED ORDER — WHITE PETROLATUM EX OINT
TOPICAL_OINTMENT | CUTANEOUS | Status: AC
  Administered 2019-05-14: 0.2
  Filled 2019-05-14: qty 28.35

## 2019-05-14 NOTE — Progress Notes (Signed)
[facility]                                                                              Patient Demographics  Robert Rowe, is a 85 y.o. male, DOB - [birth year], DGU:[id number]  Admit date - 05/12/2019   Admitting Physician Robert Shi, MD  Outpatient Primary MD for the patient is Robert Post, MD  Outpatient specialists:   LOS - 2  days   Medical records reviewed and are as summarized below:    Chief Complaint  Patient presents with   Code Stroke       Brief summary   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  Patient reported he went to bed feeling okay but woke up in the middle of the night with restlessness, noticed that he could not move his right side well.  Patient also reported numbness in the right side of his face.  Family noted slurred speech next morning and patient was brought to ED with code stroke. Patient had stopped taking both aspirin and Plavix 3 days prior to admission as he noticed bruising along the right knee/thigh area. MRI brain revealed acute CVA in the left paramedian pons, patient was admitted for further work-up.  Neurology was consulted. COVID 19 test negative  Assessment & Plan    Principal Problem: Acute CVA (cerebral vascular accident) ([facility]) -Presented with right-sided weakness, dysarthria, numbness in his right side of the face -MRI of the brain->acute nonhemorrhagic infarction affecting the left paramedian pons, advanced atrophy and mild small vessel disease. -MRA moderate to severe flow-limiting stenosis in basilar artery, high-grade stenosis of the left M1 MCA. -On 6/22 night, patient had rapid response with code stroke, developed right-sided weakness,  slurred speech, fascia, right arm drooping.  Patient was evaluated by stroke team possible left MCA/ACA watershed TIA.  CT angiogram showed nonocclusive thrombus in proximal M1 segment of left MCA, occlusion of inferior division left M2 segment at MCA bifurcation, these findings are unchanged from previous imaging. -Continue IV fluid hydration, serial neuro checks, aspirin and Plavix.  - LDL 129, continue statin -2D echo showed EF of >65%, impaired relaxation, right ventricular systolic function normal mildly elevated pulmonary pressure -PT OT evaluation recommended SNF versus CIR.  CIR consulted -SLP evaluation done, placed on regular diet  Myelopathy with acute C-spine abnormality, compression of cervical cord C2 -Concerns for myelopathy, patient underwent MRI of the C-spine, which showed severe pannus resulting in compression of the cervical cord, right greater than left opposite the mid C2 level -Neurosurgery consulted, d/w Robert Rowe, not likely candidate for any surgery given advanced age, acute CVA, multiple morbidities.  Dysarthria -Continue SLP for speech and cognitive function   mild AKI on CKD (chronic kidney disease) stage 3, GFR 30-59 ml/min (HCC) -Baseline creatinine 1.4-1.5 -Continue IV fluid hydration, creatinine improving, at baseline    Dyslipidemia -LDL 129, continue statin    GERD Currently  on PPI    Chronic diastolic CHF (congestive heart failure) (HCC) -Currently euvolemic, compensated, follow volume status closely with IV fluids    Essential hypertension  -Permissive hypertension due to acute stroke, graduated antihypertensives  Hypothyroidism -Continue Synthroid  Code Status: Full CODE STATUS DVT Prophylaxis: Lovenox Family Communication: Discussed in detail with the patient, all imaging results, lab results explained to the patient and patient's son on the phone on 6/22.   Disposition Plan: Awaiting CIR  Time Spent in minutes 25 minutes  Procedures:   MRI/MRA 2D echo  Consultants:   Neurology Neurosurgery, Robert. Arnoldo Rowe  Antimicrobials:   Anti-infectives (From admission, onward)   None         Medications  Scheduled Meds:   stroke: mapping our early stages of recovery book   Does not apply Once   aspirin EC  81 mg Oral Daily   atorvastatin  40 mg Oral q1800   clopidogrel  75 mg Oral Daily   enoxaparin (LOVENOX) injection  30 mg Subcutaneous Q24H   hydrocortisone cream   Topical BID   latanoprost  1 drop Both Eyes QHS   levothyroxine  50 mcg Oral QAC breakfast   multivitamin with minerals  1 tablet Oral Daily   pantoprazole  40 mg Oral Daily   sodium chloride flush  3 mL Intravenous Once   Continuous Infusions:  sodium chloride     PRN Meds:.acetaminophen, albuterol, hydrALAZINE, meclizine, nitroGLYCERIN, senna-docusate, traMADol      Subjective:   Robert Rowe was seen and examined today.  Still significant dysarthria, right upper extremity weakness persisting.  No acute issues overnight.  Difficult to obtain review of system from the patient due to dysarthria.  No nausea vomiting abdominal pain or diarrhea.  No chest pain or shortness of breath.  No fevers   Objective:   Vitals:   05/13/19 2346 05/14/19 0340 05/14/19 0700 05/14/19 1100  BP: 137/76 132/78 (!) 157/90 (!) 170/102  Pulse: 93 95 92 92  Resp: 16 16 16 16   Temp: 98.4 F (36.9 C) 97.9 F (36.6 C) 98.2 F (36.8 C) 97.8 F (36.6 C)  TempSrc: Oral Oral Axillary Axillary  SpO2: 93% 94% 95% 96%  Weight:      Height:        Intake/Output Summary (Last 24 hours) at 05/14/2019 1231 Last data filed at 05/14/2019 1100 Gross per 24 hour  Intake 2506.9 ml  Output 1670 ml  Net 836.9 ml     Wt Readings from Last 3 Encounters:  05/12/19 66.9 kg  01/17/19 74.8 kg  01/10/19 65.2 kg    Physical Exam  General: Alert and oriented x 3, NAD, dysarthria, follows commands   eyes:   HEENT:  Atraumatic, normocephalic  Cardiovascular:  S1 S2 clear, RRR. No pedal edema b/l  Respiratory: CTAB  Gastrointestinal: Soft, nontender, nondistended, NBS  Ext: no pedal edema bilaterally  Neuro: RUE 3/5, flaccid, right lower extremity 2/5, left upper and lower extremity 5/5  Musculoskeletal: No cyanosis, clubbing  Skin: No rashes  Psych: Normal affect and demeanor, alert and oriented x3    Data Reviewed:  I have personally reviewed following labs and imaging studies  Micro Results Recent Results (from the past 240 hour(s))  SARS Coronavirus 2 (CEPHEID - Performed in [place] hospital lab), Hosp Order     Status: None   Collection Time: 05/12/19  3:36 PM   Specimen: Nasopharyngeal Swab  Result Value Ref Range Status   SARS Coronavirus 2 NEGATIVE NEGATIVE Final  Comment: (NOTE) If result is NEGATIVE SARS-CoV-2 target nucleic acids are NOT DETECTED. The SARS-CoV-2 RNA is generally detectable in upper and lower  respiratory specimens during the acute phase of infection. The lowest  concentration of SARS-CoV-2 viral copies this assay can detect is 250  copies / mL. A negative result does not preclude SARS-CoV-2 infection  and should not be used as the sole basis for treatment or other  patient management decisions.  A negative result may occur with  improper specimen collection / handling, submission of specimen other  than nasopharyngeal swab, presence of viral mutation(s) within the  areas targeted by this assay, and inadequate number of viral copies  (<250 copies / mL). A negative result must be combined with clinical  observations, patient history, and epidemiological information. If result is POSITIVE SARS-CoV-2 target nucleic acids are DETECTED. The SARS-CoV-2 RNA is generally detectable in upper and lower  respiratory specimens dur ing the acute phase of infection.  Positive  results are indicative of active infection with SARS-CoV-2.  Clinical  correlation with patient history and other diagnostic  information is  necessary to determine patient infection status.  Positive results do  not rule out bacterial infection or co-infection with other viruses. If result is PRESUMPTIVE POSTIVE SARS-CoV-2 nucleic acids MAY BE PRESENT.   A presumptive positive result was obtained on the submitted specimen  and confirmed on repeat testing.  While 2019 novel coronavirus  (SARS-CoV-2) nucleic acids may be present in the submitted sample  additional confirmatory testing may be necessary for epidemiological  and / or clinical management purposes  to differentiate between  SARS-CoV-2 and other Sarbecovirus currently known to infect humans.  If clinically indicated additional testing with an alternate test  methodology [phone]) is advised. The SARS-CoV-2 RNA is generally  detectable in upper and lower respiratory sp ecimens during the acute  phase of infection. The expected result is Negative. Fact Sheet for Patients:  StrictlyIdeas.no Fact Sheet for Healthcare Providers: BankingDealers.co.za This test is not yet approved or cleared by the [place] FDA and has been authorized for detection and/or diagnosis of SARS-CoV-2 by FDA under an Emergency Use Authorization (EUA).  This EUA will remain in effect (meaning this test can be used) for the duration of the COVID-19 declaration under Section 564(b)(1) of the Act, 21 U.S.C. section 360bbb-3(b)(1), unless the authorization is terminated or revoked sooner. Performed at [facility], [place] [address] [place], [place] [address]     Radiology Reports Ct Angio Head W Or Wo Contrast  Result Date: 05/13/2019 CLINICAL DATA:  Right-sided weakness EXAM: CT ANGIOGRAPHY HEAD AND NECK TECHNIQUE: Multidetector CT imaging of the head and neck was performed using the standard protocol during bolus administration of intravenous contrast. Multiplanar CT image reconstructions and MIPs were obtained to evaluate  the vascular anatomy. Carotid stenosis measurements (when applicable) are obtained utilizing NASCET criteria, using the distal internal carotid diameter as the denominator. CONTRAST:  85mL OMNIPAQUE IOHEXOL 350 MG/ML SOLN COMPARISON:  Head CT 05/12/2019 FINDINGS: CTA NECK FINDINGS SKELETON: Large pannus at C2 that causes spinal canal stenosis and compression of the spinal cord as demonstrated on previous MRI. OTHER NECK: Normal pharynx, larynx and major salivary glands. No cervical lymphadenopathy. Unremarkable thyroid gland. UPPER CHEST: No pneumothorax or pleural effusion. No nodules or masses. AORTIC ARCH: There is mild calcific atherosclerosis of the aortic arch. There is no aneurysm, dissection or hemodynamically significant stenosis of the visualized ascending aorta and aortic arch. Conventional 3 vessel aortic branching  pattern. The visualized proximal subclavian arteries are widely patent. RIGHT CAROTID SYSTEM: --Common carotid artery: There is noncalcified plaque within the right common carotid artery with less than 50% stenosis. --Internal carotid artery: No dissection, occlusion or aneurysm. There is mixed density atherosclerosis extending into the proximal ICA, resulting in 50% stenosis. --External carotid artery: No acute abnormality. LEFT CAROTID SYSTEM: --Common carotid artery: Widely patent origin without common carotid artery dissection or aneurysm. --Internal carotid artery: No dissection, occlusion or aneurysm. Mild atherosclerotic calcification at the carotid bifurcation without hemodynamically significant stenosis. --External carotid artery: No acute abnormality. VERTEBRAL ARTERIES: Left dominant configuration. Right vertebral artery origin is occluded, but most of the V1 segment is opacified. There is moderate stenosis of the left vertebral artery origin. The left V1-V3 segments are normal. The right vertebral artery is diffusely diminutive with long segment loss of opacification beginning at  the C5 level. CTA HEAD FINDINGS POSTERIOR CIRCULATION: --Vertebral arteries: Severe stenosis of the left V4 segment due to predominantly calcified plaque. The right V4 segment is diminutive and only intermittently opacified. --Posterior inferior cerebellar arteries (PICA): Patent origins from the vertebral arteries. The right PICA may be supplied via collateral flow from the left side. --Anterior inferior cerebellar arteries (AICA): Not clearly visualize --Basilar artery: Severe stenosis of the midportion of the basilar artery (12:24) --Superior cerebellar arteries: Patent proximally --Posterior cerebral arteries (PCA): Posterior communicating arteries are diminutive or absent. There is multifocal severe stenosis of the right PCA, which remains patent. Stenosis of the left PCA are greatest at the proximal P2 segment. ANTERIOR CIRCULATION: --Intracranial internal carotid arteries: Atherosclerotic calcification of the internal carotid arteries at the skull base without hemodynamically significant stenosis. --Anterior cerebral arteries (ACA): Normal. Absent right A1 segment, normal variant --Middle cerebral arteries (MCA): There is focal hypoattenuation of the proximal left M1 segment, likely indicating nonocclusive thrombus (series 8, image 107, series 11, image 20). The inferior M2 division of the left MCA is occluded beginning at the MCA bifurcation (series 10, images 140 3-147), extending for a length of approximately 7 mm. The distal branches of the left MCA are patent. VENOUS SINUSES: As permitted by contrast timing, patent. ANATOMIC VARIANTS: None Review of the MIP images confirms the above findings. IMPRESSION: 1. Nonocclusive thrombus within the proximal M1 segment of the left middle cerebral artery, with occlusion of the inferior division left M2 segment beginning at the MCA bifurcation and extending over length of approximately 7 mm. Compared to the MRI/MRA of 12/31/2018, these findings are unchanged. These  results were called by telephone at the time of interpretation on 05/13/2019 at 2:40 am to Robert. Kerney Elbe , who verbally acknowledged these results. 2. 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 3. Severe stenosis of the basilar artery and multifocal bilateral posterior cerebral artery severe stenoses, also unchanged. 4. Approximately 50% stenosis of the proximal right internal carotid artery which is otherwise normal. 5. Large amount of pannus dorsal to the odontoid process with spinal canal stenosis, better characterized on recent MRI. Electronically Signed   By: Ulyses Jarred M.D.   On: 05/13/2019 02:43   Ct Angio Neck W Or Wo Contrast  Result Date: 05/13/2019 CLINICAL DATA:  Right-sided weakness EXAM: CT ANGIOGRAPHY HEAD AND NECK TECHNIQUE: Multidetector CT imaging of the head and neck was performed using the standard protocol during bolus administration of intravenous contrast. Multiplanar CT image reconstructions and MIPs were obtained to evaluate the vascular anatomy. Carotid stenosis  measurements (when applicable) are obtained utilizing NASCET criteria, using the distal internal carotid diameter as the denominator. CONTRAST:  76mL OMNIPAQUE IOHEXOL 350 MG/ML SOLN COMPARISON:  Head CT 05/12/2019 FINDINGS: CTA NECK FINDINGS SKELETON: Large pannus at C2 that causes spinal canal stenosis and compression of the spinal cord as demonstrated on previous MRI. OTHER NECK: Normal pharynx, larynx and major salivary glands. No cervical lymphadenopathy. Unremarkable thyroid gland. UPPER CHEST: No pneumothorax or pleural effusion. No nodules or masses. AORTIC ARCH: There is mild calcific atherosclerosis of the aortic arch. There is no aneurysm, dissection or hemodynamically significant stenosis of the visualized ascending aorta and aortic arch. Conventional 3 vessel aortic branching pattern. The visualized proximal subclavian arteries are  widely patent. RIGHT CAROTID SYSTEM: --Common carotid artery: There is noncalcified plaque within the right common carotid artery with less than 50% stenosis. --Internal carotid artery: No dissection, occlusion or aneurysm. There is mixed density atherosclerosis extending into the proximal ICA, resulting in 50% stenosis. --External carotid artery: No acute abnormality. LEFT CAROTID SYSTEM: --Common carotid artery: Widely patent origin without common carotid artery dissection or aneurysm. --Internal carotid artery: No dissection, occlusion or aneurysm. Mild atherosclerotic calcification at the carotid bifurcation without hemodynamically significant stenosis. --External carotid artery: No acute abnormality. VERTEBRAL ARTERIES: Left dominant configuration. Right vertebral artery origin is occluded, but most of the V1 segment is opacified. There is moderate stenosis of the left vertebral artery origin. The left V1-V3 segments are normal. The right vertebral artery is diffusely diminutive with long segment loss of opacification beginning at the C5 level. CTA HEAD FINDINGS POSTERIOR CIRCULATION: --Vertebral arteries: Severe stenosis of the left V4 segment due to predominantly calcified plaque. The right V4 segment is diminutive and only intermittently opacified. --Posterior inferior cerebellar arteries (PICA): Patent origins from the vertebral arteries. The right PICA may be supplied via collateral flow from the left side. --Anterior inferior cerebellar arteries (AICA): Not clearly visualize --Basilar artery: Severe stenosis of the midportion of the basilar artery (12:24) --Superior cerebellar arteries: Patent proximally --Posterior cerebral arteries (PCA): Posterior communicating arteries are diminutive or absent. There is multifocal severe stenosis of the right PCA, which remains patent. Stenosis of the left PCA are greatest at the proximal P2 segment. ANTERIOR CIRCULATION: --Intracranial internal carotid arteries:  Atherosclerotic calcification of the internal carotid arteries at the skull base without hemodynamically significant stenosis. --Anterior cerebral arteries (ACA): Normal. Absent right A1 segment, normal variant --Middle cerebral arteries (MCA): There is focal hypoattenuation of the proximal left M1 segment, likely indicating nonocclusive thrombus (series 8, image 107, series 11, image 20). The inferior M2 division of the left MCA is occluded beginning at the MCA bifurcation (series 10, images 140 3-147), extending for a length of approximately 7 mm. The distal branches of the left MCA are patent. VENOUS SINUSES: As permitted by contrast timing, patent. ANATOMIC VARIANTS: None Review of the MIP images confirms the above findings. IMPRESSION: 1. Nonocclusive thrombus within the proximal M1 segment of the left middle cerebral artery, with occlusion of the inferior division left M2 segment beginning at the MCA bifurcation and extending over length of approximately 7 mm. Compared to the MRI/MRA of 12/31/2018, these findings are unchanged. These results were called by telephone at the time of interpretation on 05/13/2019 at 2:40 am to Robert. Kerney Elbe , who verbally acknowledged these results. 2. 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 3. Severe stenosis of the basilar artery and multifocal bilateral posterior  cerebral artery severe stenoses, also unchanged. 4. Approximately 50% stenosis of the proximal right internal carotid artery which is otherwise normal. 5. Large amount of pannus dorsal to the odontoid process with spinal canal stenosis, better characterized on recent MRI. Electronically Signed   By: Ulyses Jarred M.D.   On: 05/13/2019 02:43   Mr Angio Head Wo Contrast  Result Date: 05/12/2019 CLINICAL DATA:  RIGHT-sided weakness and slurred speech. EXAM: MRI HEAD WITHOUT CONTRAST MRA HEAD WITHOUT CONTRAST TECHNIQUE: Multiplanar,  multiecho pulse sequences of the brain and surrounding structures were obtained without intravenous contrast. Angiographic images of the head were obtained using MRA technique without contrast. COMPARISON:  CT head earlier today. MR head 12/31/2018. MRI cervical spine described separately. FINDINGS: MRI HEAD FINDINGS Brain: There is a cluster of punctate foci, restricted diffusion, LEFT paramedian pons, corresponding low ADC, consistent with acute infarction. No hemorrhage, mass lesion, or extra-axial fluid. Advanced atrophy, not unexpected for age, with hydrocephalus ex vacuo. Mild T2 and FLAIR hyperintensities in the white matter, likely small vessel disease. Vascular: Reported separately. Skull and upper cervical spine: Normal marrow signal. There is extreme pannus surrounding the odontoid, with severe cervicomedullary compression. Sinuses/Orbits: No acute findings.  BILATERAL cataract extraction. Other: No mastoid fluid. MRA HEAD FINDINGS RIGHT ICA: Tandem 50% stenoses in the cavernous segment. Cervical and petrous segments widely patent. Mild narrowing of the supraclinoid ICA, with widely patent terminus. LEFT ICA: 50% stenosis of the cavernous/supraclinoid ICA junction. No flow reducing stenoses more distally. ICA terminus widely patent. RIGHT MCA: 50-75% stenosis in its mid M1 segment. The MCA bifurcation demonstrates mild to moderate disease affecting the M2 segments and beyond. LEFT MCA: Critical stenosis proximal M1 segment, 5 mm segment of diminished to absent flow related enhancement. Poor flow related enhancement of MCA vessels distally particularly the LEFT M2 inferior division. LEFT ACA: Dominant segment. Disc is rise to both distal anterior cerebral arteries RIGHT ACA: Atretic or occluded. Basilar artery: Multiple segmental stenoses, at least two of which are significantly flow reducing. 75% stenosis in the proximal basilar. 75-90% stenosis in the distal basilar. 50-75% stenosis in the mid basilar.  LEFT vertebral: Widely patent. RIGHT vertebral: Severely diseased, occluded at skull base. High-grade stenosis distal V4 segment, likely retrograde filling. Posterior cerebral arteries: Severe BILATERAL flow-limiting stenoses, 75-90% in the P1-P2 junctions. Beyond this, BILATERAL segmental PCA stenoses likely flow reducing, greater on the RIGHT. Cerebellar branches: Poorly seen. IMPRESSION: Acute nonhemorrhagic infarction affecting the LEFT paramedian pons. Advanced atrophy and mild small vessel disease. Widespread intracranial atherosclerotic disease, with moderate to severe flow-limiting tandem stenoses, most notable in the basilar artery. High-grade stenosis of the LEFT M1 MCA is also of concern. See discussion above. Electronically Signed   By: Staci Righter M.D.   On: 05/12/2019 15:36   Mr Brain Wo Contrast  Result Date: 05/12/2019 CLINICAL DATA:  RIGHT-sided weakness and slurred speech. EXAM: MRI HEAD WITHOUT CONTRAST MRA HEAD WITHOUT CONTRAST TECHNIQUE: Multiplanar, multiecho pulse sequences of the brain and surrounding structures were obtained without intravenous contrast. Angiographic images of the head were obtained using MRA technique without contrast. COMPARISON:  CT head earlier today. MR head 12/31/2018. MRI cervical spine described separately. FINDINGS: MRI HEAD FINDINGS Brain: There is a cluster of punctate foci, restricted diffusion, LEFT paramedian pons, corresponding low ADC, consistent with acute infarction. No hemorrhage, mass lesion, or extra-axial fluid. Advanced atrophy, not unexpected for age, with hydrocephalus ex vacuo. Mild T2 and FLAIR hyperintensities in the white matter, likely small vessel disease. Vascular: Reported  separately. Skull and upper cervical spine: Normal marrow signal. There is extreme pannus surrounding the odontoid, with severe cervicomedullary compression. Sinuses/Orbits: No acute findings.  BILATERAL cataract extraction. Other: No mastoid fluid. MRA HEAD FINDINGS  RIGHT ICA: Tandem 50% stenoses in the cavernous segment. Cervical and petrous segments widely patent. Mild narrowing of the supraclinoid ICA, with widely patent terminus. LEFT ICA: 50% stenosis of the cavernous/supraclinoid ICA junction. No flow reducing stenoses more distally. ICA terminus widely patent. RIGHT MCA: 50-75% stenosis in its mid M1 segment. The MCA bifurcation demonstrates mild to moderate disease affecting the M2 segments and beyond. LEFT MCA: Critical stenosis proximal M1 segment, 5 mm segment of diminished to absent flow related enhancement. Poor flow related enhancement of MCA vessels distally particularly the LEFT M2 inferior division. LEFT ACA: Dominant segment. Disc is rise to both distal anterior cerebral arteries RIGHT ACA: Atretic or occluded. Basilar artery: Multiple segmental stenoses, at least two of which are significantly flow reducing. 75% stenosis in the proximal basilar. 75-90% stenosis in the distal basilar. 50-75% stenosis in the mid basilar. LEFT vertebral: Widely patent. RIGHT vertebral: Severely diseased, occluded at skull base. High-grade stenosis distal V4 segment, likely retrograde filling. Posterior cerebral arteries: Severe BILATERAL flow-limiting stenoses, 75-90% in the P1-P2 junctions. Beyond this, BILATERAL segmental PCA stenoses likely flow reducing, greater on the RIGHT. Cerebellar branches: Poorly seen. IMPRESSION: Acute nonhemorrhagic infarction affecting the LEFT paramedian pons. Advanced atrophy and mild small vessel disease. Widespread intracranial atherosclerotic disease, with moderate to severe flow-limiting tandem stenoses, most notable in the basilar artery. High-grade stenosis of the LEFT M1 MCA is also of concern. See discussion above. Electronically Signed   By: Staci Righter M.D.   On: 05/12/2019 15:36   Mr Cervical Spine Wo Contrast  Result Date: 05/12/2019 CLINICAL DATA:  C-spine trauma, myelopathy. EXAM: MRI CERVICAL SPINE WITHOUT CONTRAST TECHNIQUE:  Multiplanar, multisequence MR imaging of the cervical spine was performed. No intravenous contrast was administered. COMPARISON:  Partial visualization of cervicomedullary junction on prior MR brain studies. FINDINGS: Alignment: 6 mm anterolisthesis C7 on T1. There is 3 mm degenerative anterolisthesis C2-C3. Otherwise anatomic alignment. Vertebrae: Significant erosion of C2 due to pannus. Cord: Severe cord compression opposite C2, related to pannus. Abnormal cord signal is identified along the lower margin of the cord compression, see series 6, image 9. Posterior Fossa, vertebral arteries, paraspinal tissues: No tonsillar herniation. LEFT vertebral artery is dominant. RIGHT vertebral is occluded. Disc levels: C1-C2: Severe pannus, projects posteriorly resulting in cord compression as well as erodes the odontoid. Pannus measures up to 13 mm in anterior-posterior extent. Canal diameter measures 4 mm at its most narrow point. C2-3: 3 mm facet mediated anterolisthesis. No stenosis. BILATERAL C3 foraminal narrowing. C3-4: Central protrusion with osseous spurring. Facet arthropathy. BILATERAL C4 foraminal narrowing. C4-5: Central protrusion with osseous spurring. Facet arthropathy. BILATERAL C5 foraminal narrowing. C5-6: Central protrusion with osseous spurring. Facet arthropathy. BILATERAL C6 foraminal narrowing. C6-7: Central protrusion with osseous spurring. RIGHT greater than LEFT C7 foraminal narrowing. C7-T1: 6 mm anterolisthesis. Advanced disc space narrowing. Facet arthropathy. BILATERAL C8 foraminal narrowing. 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 Abnormal cord signal is identified. Surgical consultation is warranted. Multilevel spondylosis resulting in foraminal narrowing from C2-3 through C7-T1. See discussion above. Electronically Signed   By: Staci Righter M.D.   On: 05/12/2019 15:46   Ct Head Code Stroke Wo  Contrast  Result Date: 05/12/2019 CLINICAL DATA:  Code  stroke. Left-sided weakness slurred speech. LV0 positive EXAM: CT HEAD WITHOUT CONTRAST TECHNIQUE: Contiguous axial images were obtained from the base of the skull through the vertex without intravenous contrast. COMPARISON:  MRI head 12/31/2018 FINDINGS: Brain: Moderate atrophy with ventricular enlargement which is stable from prior studies. Negative for acute infarct.  Negative for acute hemorrhage or mass. Vascular: Negative for hyperdense vessel Skull: No skull lesions. Extensive calcified pannus posterior to the dens causing spinal stenosis as noted on prior MRI. Sinuses/Orbits: Mild mucosal edema paranasal sinuses. Bilateral cataract surgery. Other: None ASPECTS ([place] Stroke Program Early CT Score) - Ganglionic level infarction (caudate, lentiform nuclei, internal capsule, insula, M1-M3 cortex): 7 - Supraganglionic infarction (M4-M6 cortex): 3 Total score (0-10 with 10 being normal): 10 IMPRESSION: 1. No acute intracranial abnormality 2. Atrophy and mild chronic microvascular ischemia 3. ASPECTS is 10 4. Severe pannus posterior to the dens causing spinal stenosis and cord compression on the right as noted on prior MRI 5. These results were called by telephone at the time of interpretation on 05/12/2019 at 11:10 am to Robert. Rory Percy, who verbally acknowledged these results. Electronically Signed   By: Franchot Gallo M.D.   On: 05/12/2019 11:13    Lab Data:  CBC: Recent Labs  Lab 05/12/19 1032 05/12/19 1100 05/14/19 0752  WBC 7.7  --  6.2  NEUTROABS 4.7  --   --   HGB 14.1 14.6 12.9*  HCT 44.0 43.0 39.7  MCV 98.7  --  97.8  PLT 232  --  371   Basic Metabolic Panel: Recent Labs  Lab 05/12/19 1032 05/12/19 1100 05/14/19 0752  NA 138 139 145  K 4.3 4.3 3.8  CL 104 105 116*  CO2 24  --  22  GLUCOSE 132* 124* 96  BUN 19 20 14   CREATININE 1.57* 1.60* 1.40*  CALCIUM 9.6  --  8.5*   GFR: Estimated Creatinine Clearance: 24.8 mL/min  (A) (by C-G formula based on SCr of 1.4 mg/dL (H)). Liver Function Tests: Recent Labs  Lab 05/12/19 1032  AST 15  ALT 11  ALKPHOS 79  BILITOT 0.7  PROT 6.1*  ALBUMIN 3.6   No results for input(s): LIPASE, AMYLASE in the last 168 hours. No results for input(s): AMMONIA in the last 168 hours. Coagulation Profile: Recent Labs  Lab 05/12/19 1032  INR 1.1   Cardiac Enzymes: No results for input(s): CKTOTAL, CKMB, CKMBINDEX, TROPONINI in the last 168 hours. BNP (last 3 results) No results for input(s): PROBNP in the last 8760 hours. HbA1C: Recent Labs    05/13/19 0632  HGBA1C 6.0*   CBG: Recent Labs  Lab 05/12/19 1049 05/13/19 0145 05/13/19 1718  GLUCAP 114* 97 90   Lipid Profile: Recent Labs    05/13/19 0632  CHOL 203*  HDL 29*  LDLCALC 129*  TRIG 226*  CHOLHDL 7.0   Thyroid Function Tests: No results for input(s): TSH, T4TOTAL, FREET4, T3FREE, THYROIDAB in the last 72 hours. Anemia Panel: No results for input(s): VITAMINB12, FOLATE, FERRITIN, TIBC, IRON, RETICCTPCT in the last 72 hours. Urine analysis:    Component Value Date/Time   COLORURINE YELLOW 01/13/2019 0027   APPEARANCEUR CLEAR 01/13/2019 0027   LABSPEC 1.025 01/13/2019 0027   PHURINE 6.0 01/13/2019 0027   GLUCOSEU NEGATIVE 01/13/2019 0027   HGBUR NEGATIVE 01/13/2019 0027   BILIRUBINUR NEGATIVE 01/13/2019 0027   KETONESUR NEGATIVE 01/13/2019 0027   PROTEINUR NEGATIVE 01/13/2019 0027   UROBILINOGEN 0.2 08/27/2015 1930   NITRITE NEGATIVE 01/13/2019 0027  LEUKOCYTESUR NEGATIVE 01/13/2019 0027     Estill Cotta M.D. [facility] 05/14/2019, 12:31 PM  Pager: [phone] Between 7am to 7pm - call Pager - [phone]  After 7pm go to [email or web address] - password [email or web address]  Call night coverage person covering after 7pm

## 2019-05-14 NOTE — Progress Notes (Signed)
  Speech Language Pathology Treatment: Dysphagia  Patient Details Name: Robert Rowe MRN: [id number] DOB: [birth year] Today's Date: 05/14/2019 Time: 4665-9935 SLP Time Calculation (min) (ACUTE ONLY): 9 min  Assessment / Plan / Recommendation Clinical Impression  Skilled treatment session focused on dysphagia goals. SLP facilitated session by providing skilled observation of pt consuming regular breakfast tray with thin liquids. Pt consumed at slow rate but good mastication and appropriate oral clearing. Additionally, pt consumed thin liquids via cup without any overt s/s of aspiration. Continue per current diet recommendation.    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  Patient reported he went to bed feeling okay but woke up in the middle of the night with restlessness, noticed that he could not move his right side well.  Patient also reported numbness in the right side of his face.  Family noted slurred speech next morning and patient was brought to ED with code stroke. MRI brain revealed acute CVA in the left paramedian pons, patient was admitted for further work-up. Pt had worsening deficits overnight.      SLP Plan  Continue with current plan of care  Patient needs continued Speech Lanaguage Pathology Services    Recommendations  Diet recommendations: Regular;Thin liquid Liquids provided via: Cup;Straw Medication Administration: Whole meds with liquid Supervision: Patient able to self feed Compensations: Slow rate;Small sips/bites Postural Changes and/or Swallow Maneuvers: Seated upright 90 degrees                Oral Care Recommendations: Oral care BID Follow up Recommendations: Skilled Nursing facility SLP  Visit Diagnosis: Dysphagia, oropharyngeal phase (R13.12);Cognitive communication deficit (R41.841) Plan: Continue with current plan of care       [place] 05/14/2019, 11:40 AM

## 2019-05-14 NOTE — Progress Notes (Signed)
STROKE TEAM PROGRESS NOTE   INTERVAL HISTORY Patient continues to have some slurred speech and right-sided weakness which however appears to be improving.  2D echo is unremarkable.  He has been seen by therapy who recommended inpatient rehab stay.  Vitals:   05/13/19 2346 05/14/19 0340 05/14/19 0700 05/14/19 1100  BP: 137/76 132/78 (!) 157/90 (!) 170/102  Pulse: 93 95 92 92  Resp: 16 16 16 16   Temp: 98.4 F (36.9 C) 97.9 F (36.6 C) 98.2 F (36.8 C) 97.8 F (36.6 C)  TempSrc: Oral Oral Axillary Axillary  SpO2: 93% 94% 95% 96%  Weight:      Height:        CBC:  Recent Labs  Lab 05/12/19 1032 05/12/19 1100 05/14/19 0752  WBC 7.7  --  6.2  NEUTROABS 4.7  --   --   HGB 14.1 14.6 12.9*  HCT 44.0 43.0 39.7  MCV 98.7  --  97.8  PLT 232  --  893    Basic Metabolic Panel:  Recent Labs  Lab 05/12/19 1032 05/12/19 1100 05/14/19 0752  NA 138 139 145  K 4.3 4.3 3.8  CL 104 105 116*  CO2 24  --  22  GLUCOSE 132* 124* 96  BUN 19 20 14   CREATININE 1.57* 1.60* 1.40*  CALCIUM 9.6  --  8.5*   Lipid Panel:     Component Value Date/Time   CHOL 203 (H) 05/13/2019 0632   TRIG 226 (H) 05/13/2019 0632   HDL 29 (L) 05/13/2019 0632   CHOLHDL 7.0 05/13/2019 0632   VLDL 45 (H) 05/13/2019 0632   LDLCALC 129 (H) 05/13/2019 8101   HgbA1c:  Lab Results  Component Value Date   HGBA1C 6.0 (H) 05/13/2019   Urine Drug Screen: No results found for: LABOPIA, COCAINSCRNUR, LABBENZ, AMPHETMU, THCU, LABBARB  Alcohol Level No results found for: ETH  IMAGING Ct Angio Head W Or Wo Contrast  Result Date: 05/13/2019 CLINICAL DATA:  Right-sided weakness EXAM: CT ANGIOGRAPHY HEAD AND NECK TECHNIQUE: Multidetector CT imaging of the head and neck was performed using the standard protocol during bolus administration of intravenous contrast. Multiplanar CT image reconstructions and MIPs were obtained to evaluate the vascular anatomy. Carotid stenosis measurements (when applicable) are obtained  utilizing NASCET criteria, using the distal internal carotid diameter as the denominator. CONTRAST:  48mL OMNIPAQUE IOHEXOL 350 MG/ML SOLN COMPARISON:  Head CT 05/12/2019 FINDINGS: CTA NECK FINDINGS SKELETON: Large pannus at C2 that causes spinal canal stenosis and compression of the spinal cord as demonstrated on previous MRI. OTHER NECK: Normal pharynx, larynx and major salivary glands. No cervical lymphadenopathy. Unremarkable thyroid gland. UPPER CHEST: No pneumothorax or pleural effusion. No nodules or masses. AORTIC ARCH: There is mild calcific atherosclerosis of the aortic arch. There is no aneurysm, dissection or hemodynamically significant stenosis of the visualized ascending aorta and aortic arch. Conventional 3 vessel aortic branching pattern. The visualized proximal subclavian arteries are widely patent. RIGHT CAROTID SYSTEM: --Common carotid artery: There is noncalcified plaque within the right common carotid artery with less than 50% stenosis. --Internal carotid artery: No dissection, occlusion or aneurysm. There is mixed density atherosclerosis extending into the proximal ICA, resulting in 50% stenosis. --External carotid artery: No acute abnormality. LEFT CAROTID SYSTEM: --Common carotid artery: Widely patent origin without common carotid artery dissection or aneurysm. --Internal carotid artery: No dissection, occlusion or aneurysm. Mild atherosclerotic calcification at the carotid bifurcation without hemodynamically significant stenosis. --External carotid artery: No acute abnormality. VERTEBRAL ARTERIES: Left dominant  configuration. Right vertebral artery origin is occluded, but most of the V1 segment is opacified. There is moderate stenosis of the left vertebral artery origin. The left V1-V3 segments are normal. The right vertebral artery is diffusely diminutive with long segment loss of opacification beginning at the C5 level. CTA HEAD FINDINGS POSTERIOR CIRCULATION: --Vertebral arteries: Severe  stenosis of the left V4 segment due to predominantly calcified plaque. The right V4 segment is diminutive and only intermittently opacified. --Posterior inferior cerebellar arteries (PICA): Patent origins from the vertebral arteries. The right PICA may be supplied via collateral flow from the left side. --Anterior inferior cerebellar arteries (AICA): Not clearly visualize --Basilar artery: Severe stenosis of the midportion of the basilar artery (12:24) --Superior cerebellar arteries: Patent proximally --Posterior cerebral arteries (PCA): Posterior communicating arteries are diminutive or absent. There is multifocal severe stenosis of the right PCA, which remains patent. Stenosis of the left PCA are greatest at the proximal P2 segment. ANTERIOR CIRCULATION: --Intracranial internal carotid arteries: Atherosclerotic calcification of the internal carotid arteries at the skull base without hemodynamically significant stenosis. --Anterior cerebral arteries (ACA): Normal. Absent right A1 segment, normal variant --Middle cerebral arteries (MCA): There is focal hypoattenuation of the proximal left M1 segment, likely indicating nonocclusive thrombus (series 8, image 107, series 11, image 20). The inferior M2 division of the left MCA is occluded beginning at the MCA bifurcation (series 10, images 140 3-147), extending for a length of approximately 7 mm. The distal branches of the left MCA are patent. VENOUS SINUSES: As permitted by contrast timing, patent. ANATOMIC VARIANTS: None Review of the MIP images confirms the above findings. IMPRESSION: 1. Nonocclusive thrombus within the proximal M1 segment of the left middle cerebral artery, with occlusion of the inferior division left M2 segment beginning at the MCA bifurcation and extending over length of approximately 7 mm. Compared to the MRI/MRA of 12/31/2018, these findings are unchanged. These results were called by telephone at the time of interpretation on 05/13/2019 at 2:40  am to Dr. Kerney Elbe , who verbally acknowledged these results. 2. 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 3. Severe stenosis of the basilar artery and multifocal bilateral posterior cerebral artery severe stenoses, also unchanged. 4. Approximately 50% stenosis of the proximal right internal carotid artery which is otherwise normal. 5. Large amount of pannus dorsal to the odontoid process with spinal canal stenosis, better characterized on recent MRI. Electronically Signed   By: Ulyses Jarred M.D.   On: 05/13/2019 02:43   Ct Angio Neck W Or Wo Contrast  Result Date: 05/13/2019 CLINICAL DATA:  Right-sided weakness EXAM: CT ANGIOGRAPHY HEAD AND NECK TECHNIQUE: Multidetector CT imaging of the head and neck was performed using the standard protocol during bolus administration of intravenous contrast. Multiplanar CT image reconstructions and MIPs were obtained to evaluate the vascular anatomy. Carotid stenosis measurements (when applicable) are obtained utilizing NASCET criteria, using the distal internal carotid diameter as the denominator. CONTRAST:  20mL OMNIPAQUE IOHEXOL 350 MG/ML SOLN COMPARISON:  Head CT 05/12/2019 FINDINGS: CTA NECK FINDINGS SKELETON: Large pannus at C2 that causes spinal canal stenosis and compression of the spinal cord as demonstrated on previous MRI. OTHER NECK: Normal pharynx, larynx and major salivary glands. No cervical lymphadenopathy. Unremarkable thyroid gland. UPPER CHEST: No pneumothorax or pleural effusion. No nodules or masses. AORTIC ARCH: There is mild calcific atherosclerosis of the aortic arch. There is no aneurysm, dissection or hemodynamically significant stenosis of the  visualized ascending aorta and aortic arch. Conventional 3 vessel aortic branching pattern. The visualized proximal subclavian arteries are widely patent. RIGHT CAROTID SYSTEM: --Common carotid artery: There is noncalcified  plaque within the right common carotid artery with less than 50% stenosis. --Internal carotid artery: No dissection, occlusion or aneurysm. There is mixed density atherosclerosis extending into the proximal ICA, resulting in 50% stenosis. --External carotid artery: No acute abnormality. LEFT CAROTID SYSTEM: --Common carotid artery: Widely patent origin without common carotid artery dissection or aneurysm. --Internal carotid artery: No dissection, occlusion or aneurysm. Mild atherosclerotic calcification at the carotid bifurcation without hemodynamically significant stenosis. --External carotid artery: No acute abnormality. VERTEBRAL ARTERIES: Left dominant configuration. Right vertebral artery origin is occluded, but most of the V1 segment is opacified. There is moderate stenosis of the left vertebral artery origin. The left V1-V3 segments are normal. The right vertebral artery is diffusely diminutive with long segment loss of opacification beginning at the C5 level. CTA HEAD FINDINGS POSTERIOR CIRCULATION: --Vertebral arteries: Severe stenosis of the left V4 segment due to predominantly calcified plaque. The right V4 segment is diminutive and only intermittently opacified. --Posterior inferior cerebellar arteries (PICA): Patent origins from the vertebral arteries. The right PICA may be supplied via collateral flow from the left side. --Anterior inferior cerebellar arteries (AICA): Not clearly visualize --Basilar artery: Severe stenosis of the midportion of the basilar artery (12:24) --Superior cerebellar arteries: Patent proximally --Posterior cerebral arteries (PCA): Posterior communicating arteries are diminutive or absent. There is multifocal severe stenosis of the right PCA, which remains patent. Stenosis of the left PCA are greatest at the proximal P2 segment. ANTERIOR CIRCULATION: --Intracranial internal carotid arteries: Atherosclerotic calcification of the internal carotid arteries at the skull base without  hemodynamically significant stenosis. --Anterior cerebral arteries (ACA): Normal. Absent right A1 segment, normal variant --Middle cerebral arteries (MCA): There is focal hypoattenuation of the proximal left M1 segment, likely indicating nonocclusive thrombus (series 8, image 107, series 11, image 20). The inferior M2 division of the left MCA is occluded beginning at the MCA bifurcation (series 10, images 140 3-147), extending for a length of approximately 7 mm. The distal branches of the left MCA are patent. VENOUS SINUSES: As permitted by contrast timing, patent. ANATOMIC VARIANTS: None Review of the MIP images confirms the above findings. IMPRESSION: 1. Nonocclusive thrombus within the proximal M1 segment of the left middle cerebral artery, with occlusion of the inferior division left M2 segment beginning at the MCA bifurcation and extending over length of approximately 7 mm. Compared to the MRI/MRA of 12/31/2018, these findings are unchanged. These results were called by telephone at the time of interpretation on 05/13/2019 at 2:40 am to Dr. Kerney Elbe , who verbally acknowledged these results. 2. 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 3. Severe stenosis of the basilar artery and multifocal bilateral posterior cerebral artery severe stenoses, also unchanged. 4. Approximately 50% stenosis of the proximal right internal carotid artery which is otherwise normal. 5. Large amount of pannus dorsal to the odontoid process with spinal canal stenosis, better characterized on recent MRI. Electronically Signed   By: Ulyses Jarred M.D.   On: 05/13/2019 02:43   Mr Angio Head Wo Contrast  Result Date: 05/12/2019 CLINICAL DATA:  RIGHT-sided weakness and slurred speech. EXAM: MRI HEAD WITHOUT CONTRAST MRA HEAD WITHOUT CONTRAST TECHNIQUE: Multiplanar, multiecho pulse sequences of the brain and surrounding structures were obtained without  intravenous contrast. Angiographic images of the  head were obtained using MRA technique without contrast. COMPARISON:  CT head earlier today. MR head 12/31/2018. MRI cervical spine described separately. FINDINGS: MRI HEAD FINDINGS Brain: There is a cluster of punctate foci, restricted diffusion, LEFT paramedian pons, corresponding low ADC, consistent with acute infarction. No hemorrhage, mass lesion, or extra-axial fluid. Advanced atrophy, not unexpected for age, with hydrocephalus ex vacuo. Mild T2 and FLAIR hyperintensities in the white matter, likely small vessel disease. Vascular: Reported separately. Skull and upper cervical spine: Normal marrow signal. There is extreme pannus surrounding the odontoid, with severe cervicomedullary compression. Sinuses/Orbits: No acute findings.  BILATERAL cataract extraction. Other: No mastoid fluid. MRA HEAD FINDINGS RIGHT ICA: Tandem 50% stenoses in the cavernous segment. Cervical and petrous segments widely patent. Mild narrowing of the supraclinoid ICA, with widely patent terminus. LEFT ICA: 50% stenosis of the cavernous/supraclinoid ICA junction. No flow reducing stenoses more distally. ICA terminus widely patent. RIGHT MCA: 50-75% stenosis in its mid M1 segment. The MCA bifurcation demonstrates mild to moderate disease affecting the M2 segments and beyond. LEFT MCA: Critical stenosis proximal M1 segment, 5 mm segment of diminished to absent flow related enhancement. Poor flow related enhancement of MCA vessels distally particularly the LEFT M2 inferior division. LEFT ACA: Dominant segment. Disc is rise to both distal anterior cerebral arteries RIGHT ACA: Atretic or occluded. Basilar artery: Multiple segmental stenoses, at least two of which are significantly flow reducing. 75% stenosis in the proximal basilar. 75-90% stenosis in the distal basilar. 50-75% stenosis in the mid basilar. LEFT vertebral: Widely patent. RIGHT vertebral: Severely diseased, occluded at skull  base. High-grade stenosis distal V4 segment, likely retrograde filling. Posterior cerebral arteries: Severe BILATERAL flow-limiting stenoses, 75-90% in the P1-P2 junctions. Beyond this, BILATERAL segmental PCA stenoses likely flow reducing, greater on the RIGHT. Cerebellar branches: Poorly seen. IMPRESSION: Acute nonhemorrhagic infarction affecting the LEFT paramedian pons. Advanced atrophy and mild small vessel disease. Widespread intracranial atherosclerotic disease, with moderate to severe flow-limiting tandem stenoses, most notable in the basilar artery. High-grade stenosis of the LEFT M1 MCA is also of concern. See discussion above. Electronically Signed   By: Staci Righter M.D.   On: 05/12/2019 15:36   Mr Brain Wo Contrast  Result Date: 05/12/2019 CLINICAL DATA:  RIGHT-sided weakness and slurred speech. EXAM: MRI HEAD WITHOUT CONTRAST MRA HEAD WITHOUT CONTRAST TECHNIQUE: Multiplanar, multiecho pulse sequences of the brain and surrounding structures were obtained without intravenous contrast. Angiographic images of the head were obtained using MRA technique without contrast. COMPARISON:  CT head earlier today. MR head 12/31/2018. MRI cervical spine described separately. FINDINGS: MRI HEAD FINDINGS Brain: There is a cluster of punctate foci, restricted diffusion, LEFT paramedian pons, corresponding low ADC, consistent with acute infarction. No hemorrhage, mass lesion, or extra-axial fluid. Advanced atrophy, not unexpected for age, with hydrocephalus ex vacuo. Mild T2 and FLAIR hyperintensities in the white matter, likely small vessel disease. Vascular: Reported separately. Skull and upper cervical spine: Normal marrow signal. There is extreme pannus surrounding the odontoid, with severe cervicomedullary compression. Sinuses/Orbits: No acute findings.  BILATERAL cataract extraction. Other: No mastoid fluid. MRA HEAD FINDINGS RIGHT ICA: Tandem 50% stenoses in the cavernous segment. Cervical and petrous  segments widely patent. Mild narrowing of the supraclinoid ICA, with widely patent terminus. LEFT ICA: 50% stenosis of the cavernous/supraclinoid ICA junction. No flow reducing stenoses more distally. ICA terminus widely patent. RIGHT MCA: 50-75% stenosis in its mid M1 segment. The MCA bifurcation demonstrates mild to moderate disease affecting the M2 segments and beyond.  LEFT MCA: Critical stenosis proximal M1 segment, 5 mm segment of diminished to absent flow related enhancement. Poor flow related enhancement of MCA vessels distally particularly the LEFT M2 inferior division. LEFT ACA: Dominant segment. Disc is rise to both distal anterior cerebral arteries RIGHT ACA: Atretic or occluded. Basilar artery: Multiple segmental stenoses, at least two of which are significantly flow reducing. 75% stenosis in the proximal basilar. 75-90% stenosis in the distal basilar. 50-75% stenosis in the mid basilar. LEFT vertebral: Widely patent. RIGHT vertebral: Severely diseased, occluded at skull base. High-grade stenosis distal V4 segment, likely retrograde filling. Posterior cerebral arteries: Severe BILATERAL flow-limiting stenoses, 75-90% in the P1-P2 junctions. Beyond this, BILATERAL segmental PCA stenoses likely flow reducing, greater on the RIGHT. Cerebellar branches: Poorly seen. IMPRESSION: Acute nonhemorrhagic infarction affecting the LEFT paramedian pons. Advanced atrophy and mild small vessel disease. Widespread intracranial atherosclerotic disease, with moderate to severe flow-limiting tandem stenoses, most notable in the basilar artery. High-grade stenosis of the LEFT M1 MCA is also of concern. See discussion above. Electronically Signed   By: Staci Righter M.D.   On: 05/12/2019 15:36   Mr Cervical Spine Wo Contrast  Result Date: 05/12/2019 CLINICAL DATA:  C-spine trauma, myelopathy. EXAM: MRI CERVICAL SPINE WITHOUT CONTRAST TECHNIQUE: Multiplanar, multisequence MR imaging of the cervical spine was performed. No  intravenous contrast was administered. COMPARISON:  Partial visualization of cervicomedullary junction on prior MR brain studies. FINDINGS: Alignment: 6 mm anterolisthesis C7 on T1. There is 3 mm degenerative anterolisthesis C2-C3. Otherwise anatomic alignment. Vertebrae: Significant erosion of C2 due to pannus. Cord: Severe cord compression opposite C2, related to pannus. Abnormal cord signal is identified along the lower margin of the cord compression, see series 6, image 9. Posterior Fossa, vertebral arteries, paraspinal tissues: No tonsillar herniation. LEFT vertebral artery is dominant. RIGHT vertebral is occluded. Disc levels: C1-C2: Severe pannus, projects posteriorly resulting in cord compression as well as erodes the odontoid. Pannus measures up to 13 mm in anterior-posterior extent. Canal diameter measures 4 mm at its most narrow point. C2-3: 3 mm facet mediated anterolisthesis. No stenosis. BILATERAL C3 foraminal narrowing. C3-4: Central protrusion with osseous spurring. Facet arthropathy. BILATERAL C4 foraminal narrowing. C4-5: Central protrusion with osseous spurring. Facet arthropathy. BILATERAL C5 foraminal narrowing. C5-6: Central protrusion with osseous spurring. Facet arthropathy. BILATERAL C6 foraminal narrowing. C6-7: Central protrusion with osseous spurring. RIGHT greater than LEFT C7 foraminal narrowing. C7-T1: 6 mm anterolisthesis. Advanced disc space narrowing. Facet arthropathy. BILATERAL C8 foraminal narrowing. 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 Abnormal cord signal is identified. Surgical consultation is warranted. Multilevel spondylosis resulting in foraminal narrowing from C2-3 through C7-T1. See discussion above. Electronically Signed   By: Staci Righter M.D.   On: 05/12/2019 15:46    PHYSICAL EXAM Pleasant frail elderly Caucasian male not in distress. . Afebrile. Head is nontraumatic. Neck is  supple without bruit.    Cardiac exam no murmur or gallop. Lungs are clear to auscultation. Distal pulses are well felt. Neurological Exam ;  Awake  Alert oriented x 3.  Slightly dysarthric speech .mild decreased hearing bilaterally.  Eye movements full without nystagmus.fundi were not visualized. Vision acuity and fields appear normal.Palatal movements are normal.  Mild right lower facial weakness.. Tongue midline. Normal strength, tone, reflexes and coordination except right upper extremity strength is 4/5 with weakness of right grip and intrinsic hand muscles..  Slightly impaired right finger-to-nose coordination.  Normal sensation. Gait deferred.  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 Following admission, had neuro worsening with minimal movement of RUE and new R facial weakness, improved with fluid bolus.   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 Small vessel disease. Atrophy. ASPECTS 10.   CT CS severe pannus posterior to the dens causing spinal stenosis and R cord compression  MRI  L paramedian pontine. Advanced small vessel disease. Atrophy.   MRA  Widespread intracranial atherosclerosis w/ mod to severe flow-limiting tandem stenoses, esp in the BA and L MCA M1  CTA head nonocclusive thrombus in L MCA M1 with occlusion of inferior division L M2 extending over 7 mm.  Essentially unchanged from previous MRI/MRA.  Occlusion of R V2 and V3, also unchanged.  Severe stenosis of BA & B PCAs.  Large amount of pannus dorsal to the odontoid process with spinal canal stenosis.  2D Echo normal EF  LDL 129  HgbA1c 6.0.  Lovenox 30 mg sq daily for VTE prophylaxis  aspirin 81 mg daily and clopidogrel 75 mg daily prior to admission  but stopped 3 days prior to admission given bruising along R knee/thigh, now on aspirin 81 mg daily and clopidogrel 75 mg daily. X 3 months and then aspirin alone  Therapy recommendations: CLR  Disposition: CLR   Follow up with Dr. Delice Lesch  Hypertension  Stable . Permissive hypertension (OK if < 220/120) but gradually normalize in 5-7 days . Long-term BP goal normotensive  Hyperlipidemia  Home meds:  No statin  Now on Lipitor 40  LDL 129, goal < 70  Has refused statins in the past, including during last admission in February  Continue statin at discharge  Dysphagia   Secondary to stroke   N.p.o.   Speech therapy on board    other Stroke Risk Factors  Advanced age  Former Cigarette smoker, quit 77 yrs ago  ETOH use, advised to drink no more than 2 drink(s) a day  Hx stroke/TIA - followed by Dr. Delice Lesch ? 2013 neg MRI and EEG ? 01/2015 slumped to R. EEG normal. MRI punctate cortical infarct posterior L parietal love ? 09/2015 B leg weakness slurred speech. MRI sign IC ant and post circ ? 06/26/2016 loss of consciousness VB TIA ? 05/2018 memory loss, difficulty completing complex tasks c/w mild dementia ? 12/2018 - Incidental left occipital infarct secondary to large vessel disease source. Official dx BPPV as cause of dizziness  Chronic diastolic Congestive heart failure  Other Active Problems  Chronic OS blindness  RA  Myelopathy with acute C-spine abnormality, compression of cervical cord C2 -discussed during previous admission.  Can consider outpatient neurosurgical follow-up however patient is likely not a surgical candidate-age and refusal  Mild AKI on CKD stage III  GERD  Hypothyroidism  Hospital day # 2  He presented with slurred speech and right arm weakness due to left pontine infarct from small vessel disease however he does have prior history of TIAs and multifocal severe large vessel intracranial atherosclerosis.  Recommend aspirin and Plavix for  3 months followed by aspirin alone.  Aggressive risk factor modification.  Follow-up as an outpatient with Dr. Delice Lesch patient's primary neurologist.  Stroke team will sign off.  Kindly call for questions. Antony Contras,  MD Medical Director [facility] Pager: [phone] 05/14/2019 3:01 PM   To contact Stroke Continuity provider, please refer to [email or web address] After hours, contact General Neurology

## 2019-05-14 NOTE — Progress Notes (Signed)
Spoke with wife to update her of his care and condition thus far today.  Very thankful for the update and the care given.

## 2019-05-14 NOTE — Progress Notes (Signed)
Pt stable during shift but during the night; pt kept on howling and call out for his wife multiple times. Reported off to oncoming RN. Delia Heady RN

## 2019-05-14 NOTE — Evaluation (Signed)
Speech Language Pathology Evaluation Patient Details Name: Robert Rowe MRN: [id number] DOB: [birth year] Today's Date: 05/14/2019 Time: 0867-6195 SLP Time Calculation (min) (ACUTE ONLY): 15 min  Problem List:  Patient Active Problem List   Diagnosis Date Noted  . Acute cholecystitis 01/14/2019  . Cholecystitis   . 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 Gait disturbance, post-stroke   . BPV (benign positional vertigo) 01/02/2019  . History of stroke 01/01/2019  . TIA (transient ischemic attack) 12/31/2018  . CVA (cerebral vascular accident) ([place]) 12/31/2018  . Chest pain 05/15/2018  . CKD (chronic kidney disease) stage 3, GFR 30-59 ml/min (HCC) 05/08/2018  . Other fatigue 06/13/2017  . Abnormal diffusion capacity determined by pulmonary function test 06/13/2017  . Chronic diastolic CHF (congestive heart failure) ([place]) 11/01/2016  . Essential hypertension 11/01/2016  . Intracranial vascular stenosis 11/09/2015  . Mild cognitive impairment 11/09/2015  . 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 Benign prostatic hyperplasia with urinary obstruction 03/19/2015  . Awareness alteration, transient 02/26/2015  . PVC's (premature ventricular contractions) 08/22/2013  . Hypothyroidism 05/09/2013  . Stricture and stenosis of esophagus 03/12/2013  . Dysphagia 02/22/2013  . DOE (dyspnea on exertion) 03/23/2011  . PALPITATIONS 02/12/2010  . CHEST DISCOMFORT 04/25/2009  . PEDAL EDEMA 05/01/2008  . GERD 12/03/2007  . Dyslipidemia 04/05/2007  . BENIGN PROSTATIC HYPERTROPHY 04/05/2007  . Rheumatoid arthritis ([place]) 04/05/2007  . Osteoarthritis 04/05/2007  . History of cardiovascular disorder 04/05/2007   Past Medical History:  Past Medical History:  Diagnosis Date  . Acute cholecystitis 12/2018  . BENIGN PROSTATIC HYPERTROPHY 04/05/2007  . 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 CHEST DISCOMFORT 04/25/2009  . Chronic diastolic CHF (congestive heart failure)  ([place])    a. Echo 12/16: mild LVH, EF 55-60%, no RWMA, Gr 1 DD, mild AI, MAC, mild LAE, normal RVF, PASP 37 mmHg  . Chronic kidney disease    chronic   . DJD (degenerative joint disease)   . Dysrhythmia    pvc's   . GERD 12/03/2007  . Hiatal hernia   . History of nuclear stress test    Myoview 1/17: EF 53%, normal perfusion; Low Risk  . HYPERLIPIDEMIA 04/05/2007  . Hypothyroidism   . Osteoarth NOS-Unspec 04/05/2007  . Palpitations 02/12/2010  . PEDAL EDEMA 05/01/2008  . Rheumatoid arthritis(714.0) 04/05/2007  . Shortness of breath dyspnea   . TRANSIENT ISCHEMIC ATTACK, HX OF 04/05/2007   ?    Past Surgical History:  Past Surgical History:  Procedure Laterality Date  . BALLOON DILATION N/A 03/12/2013   Procedure: BALLOON DILATION;  Surgeon: Inda Castle, MD;  Location: [facility];  Service: Endoscopy;  Laterality: N/A;  . CARPAL TUNNEL RELEASE  11/28/2018   Left hand  . CATARACT EXTRACTION  2001   bilateral  . CYSTOSCOPY WITH URETHRAL DILATATION N/A 09/24/2015   Procedure: CYSTOSCOPY WITH COOK BALLOON DILATATION OF URETHRAL STRICTURE ;  Surgeon: Carolan Clines, MD;  Location: [facility];  Service: Urology;  Laterality: N/A;  . CYSTOSCOPY WITH URETHRAL DILATATION N/A 07/20/2016   Procedure: CYSTOSCOPY WITH URETHRAL DILATATION;  Surgeon: Alexis Frock, MD;  Location: [facility];  Service: Urology;  Laterality: N/A;  1 HOUR  . ESOPHAGOGASTRODUODENOSCOPY N/A 03/12/2013   Procedure: ESOPHAGOGASTRODUODENOSCOPY (EGD);  Surgeon: Inda Castle, MD;  Location: [facility];  Service: Endoscopy;  Laterality: N/A;  . HEMORRHOID SURGERY    . HERNIA REPAIR     inguinal x5  . KNEE SURGERY  arthroscopic   left  . PROSTATE  CRYOABLATION     2000  . SHOULDER SURGERY  few years ago   left  . TRANSURETHRAL RESECTION OF PROSTATE N/A 03/19/2015   Procedure: TRANSURETHRAL RESECTION OF THE PROSTATE (TURP);  Surgeon: Carolan Clines, MD;  Location: [facility];  Service: Urology;  Laterality: N/A;   HPI:   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  Patient reported he went to bed feeling okay but woke up in the middle of the night with restlessness, noticed that he could not move his right side well.  Patient also reported numbness in the right side of his face.  Family noted slurred speech next morning and patient was brought to ED with code stroke. MRI brain revealed acute CVA in the left paramedian pons, patient was admitted for further work-up. Pt had worsening deficits overnight.   Assessment / Plan / Recommendation Clinical Impression  Pt appears with worsening cognitive function as compared to SLE on 01/01/19. During this evaluation, pt with decreased orientation to time and situation, difficulty sustaining attention and difficulty problem solving basic tasks. Pt with deficits in speech intelligibility as well as verbal output limited by overall confused state. Pt's speech intelligibility is ~ 75% at sentence level with decreased attempts to verbally interact with SLP. Given nature of deficits and recurrent CVAs as well as decreased task endurance, recommend SNF placement for further support.     SLP Assessment  SLP Recommendation/Assessment: Patient needs continued Speech Lanaguage Pathology Services SLP Visit Diagnosis: Dysphagia, oropharyngeal phase (R13.12);Cognitive communication deficit (R41.841)    Follow Up Recommendations  Skilled Nursing facility    Frequency and Duration min 2x/week  2 weeks      SLP Evaluation Cognition  Overall Cognitive Status: No family/caregiver present to determine baseline cognitive functioning Arousal/Alertness: Awake/alert Orientation Level: Oriented to person;Oriented to place;Disoriented to  time;Disoriented to situation Attention: Sustained Sustained Attention: Impaired Sustained Attention Impairment: Verbal complex;Functional complex Memory: Impaired Memory Impairment: Decreased recall of new information Problem Solving: Impaired Problem Solving Impairment: Verbal basic;Functional basic Behaviors: Poor frustration tolerance;Perseveration       Comprehension  Auditory Comprehension Overall Auditory Comprehension: Impaired(d/t overall confusion)    Expression Expression Primary Mode of Expression: Verbal Verbal Expression Overall Verbal Expression: Impaired Initiation: Impaired Automatic Speech: Name Level of Generative/Spontaneous Verbalization: Sentence   Oral / Motor  Motor Speech Overall Motor Speech: Impaired Respiration: Within functional limits Phonation: Normal Resonance: Within functional limits Articulation: Impaired Level of Impairment: Sentence Intelligibility: Intelligibility reduced Word: 75-100% accurate Phrase: 75-100% accurate Sentence: 50-74% accurate Conversation: Not tested Motor Planning: Witnin functional limits Motor Speech Errors: Not applicable   GO                    Torrian Canion 05/14/2019, 11:38 AM

## 2019-05-14 NOTE — Progress Notes (Signed)
Spoke with son [name] to update him of events during the night and so far this am.  Was very thankful and said will call back later for further updates.

## 2019-05-14 NOTE — Progress Notes (Addendum)
Inpatient Rehabilitation Admissions Coordinator  Inpatient Rehab Consult received. I met with patient at the bedside for rehabilitation assessment. We discussed goals and expectations of an inpatient rehab admission.   He was previously at [facility] 12/2018 and went home. Since that time he has also been to [facility] before returning home with his wife. I contacted his son, [name], with his permission. Son will provide whatever assistance he needs in the home that is recommended but prefers an inpt rehab admit . I explained that I will have to obtain [facility] insurance approval. I await further progress with therapy before proceeding with authorization. I will follow up tomorrow.  Danne Baxter, RN, MSN Rehab Admissions Coordinator [phone] 05/14/2019 12:21 PM

## 2019-05-15 NOTE — Progress Notes (Signed)
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 He presented 6/21 with complaints of right-sided weakness and slurred speech beginning the night before.  MRI revealed acute nonhemorrhagic infarct of L paramedian pons. Pt with acute decompensation early morning 6/22 with increased R sided weakness. Follow-up MRI ordered.     PT Comments    Pt progressing slowly towards PT POC. Noted RUE appeared more swollen today with darker complexion from hand to elbow. RN present to assess. Pt's watch removed as it was very tight, however unsure if this was the reason for changes. Pt somewhat self-limiting, but willing to try. At this time I do not feel this patient is appropriate for CIR level therapies, and would be more appropriate for SNF vs home with 24 hour caregivers (will need +2 assist). Will continue to follow and progress as able per POC.     Follow Up Recommendations  SNF;Supervision/Assistance - 24 hour     Equipment Recommendations  Other (comment)(TBD by next venue of care)    Recommendations for Other Services       Precautions / Restrictions Precautions Precautions: None Restrictions Weight Bearing Restrictions: No    Mobility  Bed Mobility               General bed mobility comments: Pt received upright in recliner, returned to recliner  Transfers Overall transfer level: Needs assistance Equipment used: 2 person hand held assist Transfers: Sit to/from Stand Sit to Stand: Max assist;+2 physical assistance;Total assist         General transfer comment: Completed sit > stand x2 (recliner to [place], Stedy back to recliner) with max assist +2 to facilitate  weight shift to achieve full stand.  Pt unable to tolerate partial stand from support flaps from [facility], requesting to return to sitting in chair.  Ambulation/Gait             General Gait Details: Unable to progress to gait training this session.    Stairs             Wheelchair Mobility    Modified Rankin (Stroke Patients Only) Modified Rankin (Stroke Patients Only) Pre-Morbid Rankin Score: Moderate disability Modified Rankin: Severe disability     Balance Overall balance assessment: Needs assistance Sitting-balance support: Feet supported;Single extremity supported Sitting balance-Leahy Scale: Poor Sitting balance - Comments: assist required at EOB ranging from supervision to mod assist.  Postural control: Posterior lean Standing balance support: Bilateral upper extremity supported;During functional activity Standing balance-Leahy Scale: Zero Standing balance comment: +2 assist required.                             Cognition Arousal/Alertness: Awake/alert Behavior During Therapy: WFL for tasks assessed/performed Overall Cognitive Status: No family/caregiver present to determine baseline cognitive functioning                                        Exercises      General Comments        Pertinent Vitals/Pain Pain Assessment: Faces Faces Pain Scale: Hurts a little  bit Pain Location: Rt arm Pain Descriptors / Indicators: Grimacing;Sharp Pain Intervention(s): Monitored during session    Home Living                      Prior Function            PT Goals (current goals can now be found in the care plan section) Acute Rehab PT Goals Patient Stated Goal: to use Rt hand PT Goal Formulation: With patient Time For Goal Achievement: 05/27/19 Potential to Achieve Goals: Fair Progress towards PT goals: Progressing toward goals    Frequency    Min 3X/week      PT Plan Current plan remains appropriate     Co-evaluation PT/OT/SLP Co-Evaluation/Treatment: Yes Reason for Co-Treatment: Complexity of the patient's impairments (multi-system involvement);For patient/therapist safety;To address functional/ADL transfers PT goals addressed during session: Mobility/safety with mobility;Balance;Proper use of DME OT goals addressed during session: ADL's and self-care      AM-PAC PT "6 Clicks" Mobility   Outcome Measure  Help needed turning from your back to your side while in a flat bed without using bedrails?: A Lot Help needed moving from lying on your back to sitting on the side of a flat bed without using bedrails?: A Lot Help needed moving to and from a bed to a chair (including a wheelchair)?: Total Help needed standing up from a chair using your arms (e.g., wheelchair or bedside chair)?: A Lot Help needed to walk in hospital room?: Total Help needed climbing 3-5 steps with a railing? : Total 6 Click Score: 9    End of Session Equipment Utilized During Treatment: Gait belt Activity Tolerance: Patient limited by fatigue Patient left: in chair;with call bell/phone within reach;with chair alarm set Nurse Communication: Mobility status PT Visit Diagnosis: Unsteadiness on feet (R26.81);Muscle weakness (generalized) (M62.81);Other symptoms and signs involving the nervous system (R29.898);Hemiplegia and hemiparesis Hemiplegia - Right/Left: Right Hemiplegia - dominant/non-dominant: Dominant Hemiplegia - caused by: Cerebral infarction     Time: 3536-1443 PT Time Calculation (min) (ACUTE ONLY): 28 min  Charges:  $Therapeutic Activity: 8-22 mins                     Robert Rowe, PT, DPT Acute Rehabilitation Services Pager: [phone] Office: [phone]    Thelma Comp 05/15/2019, 1:32 PM

## 2019-05-15 NOTE — Care Management Important Message (Signed)
Important Message  Patient Details  Name: Robert Rowe MRN: [id number] Date of Birth: 08/29/1918   Medicare Important Message Given:  Yes     [name] 05/15/2019, 2:14 PM

## 2019-05-15 NOTE — Consult Note (Signed)
   [facility] Inpatient Consult   05/15/2019  Robert Rowe Apr 19, 1918 [id number]  Patient was assessed for [facility] for community services. Patient was previously active with [facility].  Patient with [employer].  Chart was reviewed for 4 admissions in the past 6 months and review of MD notes as below as follows:  Robert Rowe is a very interesting 85 y.o. 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  Patient states he went to bed feeling okay but woke up in the middle of night with restlessness, wanted to use the bathroom and noticed that he could not move his right side well.  He also reports feeling numb in the right side of his face.  He states his family noted slurred speech this morning and he was brought into the ED as code stroke.  He still feels somewhat weak in his right side especially the right upper extremity.  He states his speech is better but not back to his baseline. He denies any tingling or numbness.  Patient states at baseline he is on Plavix daily but he started taking a baby aspirin about 2 weeks back (does not recollect the indication).  He noticed ecchymosis/bruising along the right knee/thigh area and got concerned.  He tells me he stopped taking both aspirin and Plavix 3 days back.  He quit smoking many years back, drinks whiskey occasionally and lives with his 35 year old wife.  Also patient has been recommended for inpatient rehab as well as possible SNF. Plan: Will follow for disposition of needs for returning home. If for SNF, [facility] will sign off.    Of note, [facility] Care Management services does not replace or interfere with any services that are arranged by inpatient case  management or social work.   For additional questions or referrals please contact:  Natividad Brood, RN BSN [facility] Liaison  [phone] business mobile phone Toll free office [phone]  Fax number: [phone] [email or web address]

## 2019-05-15 NOTE — Progress Notes (Signed)
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 He presented 6/21 with complaints of right-sided weakness and slurred speech beginning the night before.  MRI revealed acute nonhemorrhagic infarct of L paramedian pons. Pt with acute decompensation early morning 6/22 with increased R sided weakness. Follow-up MRI ordered.    OT comments  Treatment session with focus on functional grasp and ROM in RUE and sit > stand. Noted change in color in RUE, RN present in room and assessed.  Max assist +2 for sit > stand requiring facilitation for anterior weight shift.  Pt required increased assistance and encouragement this session with pt verbalizing frustration with lack of progress.  Pt reporting pain in RUE when therapist facilitating reach forward to grasp Stedy bar.  Support provided at elbow and hand to maintain position and grasp.  Pt with decreased tolerance in partial stand, therefore returned to sitting.  Pt continues to require +2 for sit > stand, functional transfers, and any LB self-care tasks.  At this time, this therapist does not feel pt is appropriate for CIR level therapies, had hoped for more improvement in activity tolerance.  Pt will continue to benefit from OT acutely to decrease burden of care.  Follow Up Recommendations  SNF          Precautions / Restrictions Precautions Precautions: None       Mobility Bed Mobility               General bed mobility comments: Pt received upright in recliner, returned to recliner  Transfers Overall transfer level: Needs assistance Equipment used: 2 person hand held assist Transfers: Sit to/from Stand Sit to Stand: Max  assist;+2 physical assistance         General transfer comment: Completed sit > stand x2 (recliner to [place], Stedy back to recliner) with max assist +2 to facilitate weight shift to achieve full stand.  Pt unable to tolerate partial stand, requesting to return to sitting.        ADL either performed or assessed with clinical judgement   ADL Overall ADL's : Needs assistance/impaired         Upper Body Bathing: Sitting;Moderate assistance   Lower Body Bathing: +2 for physical assistance;Maximal assistance;Sitting/lateral leans;Sit to/from stand       Lower Body Dressing: +2 for physical assistance;Sitting/lateral leans;Sit to/from stand;Total assistance   Toilet Transfer: +2 for physical assistance;Stand-pivot;Maximal assistance Toilet Transfer Details (indicate cue type and reason): simulated with Stedy.  Pt required max assist +2 for sit > stand in [facility] session.  Pt demonstrating decreased initiation and weight shift         Functional mobility during ADLs: +2 for physical assistance General ADL Comments: Michaelyn Barter for sit > stand.  Pt with increased pain and decreased activity tolerance this session.               Cognition Arousal/Alertness: Awake/alert Behavior During Therapy: WFL for tasks assessed/performed Overall Cognitive Status: No family/caregiver present to determine baseline cognitive functioning  Pertinent Vitals/ Pain       Pain Assessment: Faces Faces Pain Scale: Hurts a little bit Pain Location: Rt arm Pain Descriptors / Indicators: Grimacing;Sharp Pain Intervention(s): Limited activity within patient's tolerance;Monitored during session;Repositioned         Frequency  Min 3X/week        Progress Toward Goals  OT Goals(current goals can now be found in the care plan section)  Progress towards OT goals: Progressing toward goals  Acute Rehab OT  Goals Patient Stated Goal: to use Rt hand OT Goal Formulation: With patient Time For Goal Achievement: 05/27/19 Potential to Achieve Goals: [place] Discharge plan remains appropriate    Co-evaluation    PT/OT/SLP Co-Evaluation/Treatment: Yes Reason for Co-Treatment: Complexity of the patient's impairments (multi-system involvement);For patient/therapist safety;To address functional/ADL transfers   OT goals addressed during session: ADL's and self-care      AM-PAC OT "6 Clicks" Daily Activity     Outcome Measure   Help from another person eating meals?: A Lot Help from another person taking care of personal grooming?: A Lot Help from another person toileting, which includes using toliet, bedpan, or urinal?: Total Help from another person bathing (including washing, rinsing, drying)?: Total Help from another person to put on and taking off regular upper body clothing?: A Lot Help from another person to put on and taking off regular lower body clothing?: Total 6 Click Score: 9    End of Session Equipment Utilized During Treatment: Gait belt  OT Visit Diagnosis: Unsteadiness on feet (R26.81);Hemiplegia and hemiparesis Hemiplegia - Right/Left: Right Hemiplegia - dominant/non-dominant: Dominant   Activity Tolerance Patient tolerated treatment well   Patient Left in chair;with call bell/phone within reach;with chair alarm set   Nurse Communication Mobility status(redness in arm and middle of back)        Time: 1314-3888 OT Time Calculation (min): 28 min  Charges: OT General Charges $OT Visit: 1 Visit OT Treatments $Self Care/Home Management : 8-22 mins    Simonne Come, [phone] 05/15/2019, 1:21 PM

## 2019-05-15 NOTE — Progress Notes (Signed)
SLP Cancellation Note  Patient Details Name: Robert Rowe MRN: [id number] DOB: November 13, 1918   Cancelled treatment:       Reason Eval/Treat Not Completed: Fatigue/lethargy limiting ability to participate(Pt's nurse, Robert Rowe, reported that the pt has been notably more lethargic and less communicative this afternoon. SLP will re-attempt on a subsequent date)  Sante Biedermann I. Hardin Negus, [place], [place] Office number [phone] Pager [phone]  Horton Marshall 05/15/2019, 5:33 PM

## 2019-05-15 NOTE — Progress Notes (Addendum)
PROGRESS NOTE    Robert Rowe  AOZ:[id number] DOB: 08/12/1918 DOA: 05/12/2019 PCP: [name], MD   Brief Narrative: 85 year old male, World War II veteran, retired physician with history of hyperlipidemia, hypothyroidism, GERD, CKD stage III, diastolic CHF, RA, history of prior TIAs on aspirin and Plavix at baseline presented to ED with right-sided weakness which he noted a night before the admission.  Patient reported he went to bed feeling okay but woke up in the middle of the night with restlessness, noticed that he could not move his right side well.  Patient also reported numbness in the right side of his face.  Family noted slurred speech next morning and patient was brought to ED with code stroke. Patient had stopped taking both aspirin and Plavix 3 days prior to admission as he noticed bruising along the right knee/thigh area. MRI brain revealed acute CVA in the left paramedian pons, patient was admitted for further work-up.  Neurology was consulted. COVID 19 test negative  Subjective: Seen this morning. No new complaints, resting comfortably.Mildly lethargic, difficult comprehend his voice. Elderly, not in acute distress.  Assessment & Plan:   Acute CVA with right-sided weakness dysarthria numbness on the right face and MRI showing acute infarction of left paramedian pons with advanced atrophy and mild SVD: Seen by neurology underwent extensive work-up with MRA brain, blood work-up with LDL 129, hba1c 6.0 HBA1C echo with EF more than 65% w/ impaired relaxation.  Seen by neurology appreciate input. He was on ASA 81 mg daily and clopidogrel 75 mg daily but WAS stopped 3 days prior to admission given bruising along R knee/thigh. Neuro recommended to cont ASA/PLAVIX X 3 months then ASA 81 alone, cont lipitor ( he had Refused discharge in the past including last admission in February). Cont PT/OT/SLP- awaiting CIR.Continue diet, monitor for aspiration precaution.  Code strove on 6/22  with rapid response  for right-sided weakness and slurred speech and right arm drop and had CT angiogram that showed nonocclusive thrombus in the proximal M1 segment of left MCA, occlusion of inferior division of left M2 segment at MCA bifurcation, unchanged from previous imaging. Cont plan as per #1.  Somewhat lethargic this evening per nursing, in the morning was also somewhat lethargic but perked up in the afternoon. Came back to see him- resting on chair. More or less same like this am.Monitor neuro-check.  Myelopathy with acute C-spine abnormality, compression of cervical cord C2: S/P MRI of the C-spine, which showed severe pannus resulting in compression of the cervical cord, right greater than left opposite the mid C2 level. SEEN BY Neurosurgery Dr Arnoldo Morale, not likely candidate for any surgery given advanced age, acute CVA, multiple morbidities. Cont supportive care pain control PT OT.  Dysarthria in the setting of stroke continue  SLP follow up  Dyslipidemia ; on statins  GERD- on ppi  Chronic diastolic CHF: Currently euvolemic.  Essential hypertension: Allow permissive hypertension with history.  Blood pressure stable  CKD stage 3 w mild AKI- Creat stable, baseline 1.4-1.6 Recent Labs  Lab 05/12/19 1032 05/12/19 1100 05/14/19 0752  BUN 19 20 14   CREATININE 1.57* 1.60* 1.40*   Hypothyroidism:Continue Synthroid.   Code Status: full code DVT Prophylaxis: SQ Lovenox Family Communication: Plan of care discussed with the patient, son has been updated by the care team is aware about pending inpatient rehab placement   Disposition Plan: Awaiting on CIR placement.  Procedures:  MRI/MRA: -MRI of the brain->acute nonhemorrhagic infarction affecting the left paramedian pons,  advanced atrophy and mild small vessel disease. -MRA moderate to severe flow-limiting stenosis in basilar artery, high-grade stenosis of the left M1 MCA.  Consultants:   Neurology Neurosurgery, Dr. Arnoldo Morale   Antimicrobials: Anti-infectives (From admission, onward)   None       Objective: Vitals:   05/14/19 1936 05/14/19 2345 05/15/19 0342 05/15/19 0748  BP: (!) 158/90 121/90 127/90 137/73  Pulse: 98 92 100 100  Resp: 16 20 20  (!) 25  Temp: 97.6 F (36.4 C) 98.4 F (36.9 C) 98.7 F (37.1 C) 99.5 F (37.5 C)  TempSrc: Oral Oral Axillary Oral  SpO2: 95% 96% 96% 95%  Weight:      Height:        Intake/Output Summary (Last 24 hours) at 05/15/2019 1142 Last data filed at 05/15/2019 0611 Gross per 24 hour  Intake 2863.44 ml  Output 1350 ml  Net 1513.44 ml   Filed Weights   05/12/19 1931  Weight: 66.9 kg   Weight change:   Body mass index is 26.98 kg/m.  Intake/Output from previous day: 06/23 0701 - 06/24 0700 In: 2963.4 [P.O.:320; I.V.:2643.4] Out: 1770 [Urine:1770] Intake/Output this shift: No intake/output data recorded.  Examination:  General exam: Appears calm and comfortable, sleepy, weak, frail  HEENT:PERRL,Oral mucosa moist, Ear/Nose normal on gross exam Respiratory system: Bilateral equal air entry, normal vesicular breath sounds, no wheezes or crackles  Cardiovascular system: S1 & S2 heard,No JVD, murmurs. Gastrointestinal system: Abdomen is  soft, non tender, non distended, BS +  Nervous System:follows commands, weak, mildly dysarthric, weakness noted on the right UE 4/5.Sensation intact. Extremities: No edema, no clubbing, distal peripheral pulses palpable. Skin: No rashes, lesions, no icterus MSK: Normal muscle bulk,tone ,power  Medications:  Scheduled Meds: .  stroke: mapping our early stages of recovery book   Does not apply Once  . aspirin EC  81 mg Oral Daily  . atorvastatin  40 mg Oral q1800  . clopidogrel  75 mg Oral Daily  . enoxaparin (LOVENOX) injection  30 mg Subcutaneous Q24H  . hydrocortisone cream   Topical BID  . latanoprost  1 drop Both Eyes QHS  . levothyroxine  50 mcg Oral QAC breakfast  . multivitamin with minerals  1 tablet  Oral Daily  . pantoprazole  40 mg Oral Daily  . sodium chloride flush  3 mL Intravenous Once   Continuous Infusions: . sodium chloride 100 mL/hr at 05/15/19 3244    Data Reviewed: I have personally reviewed following labs and imaging studies  CBC: Recent Labs  Lab 05/12/19 1032 05/12/19 1100 05/14/19 0752  WBC 7.7  --  6.2  NEUTROABS 4.7  --   --   HGB 14.1 14.6 12.9*  HCT 44.0 43.0 39.7  MCV 98.7  --  97.8  PLT 232  --  010   Basic Metabolic Panel: Recent Labs  Lab 05/12/19 1032 05/12/19 1100 05/14/19 0752  NA 138 139 145  K 4.3 4.3 3.8  CL 104 105 116*  CO2 24  --  22  GLUCOSE 132* 124* 96  BUN 19 20 14   CREATININE 1.57* 1.60* 1.40*  CALCIUM 9.6  --  8.5*   GFR: Estimated Creatinine Clearance: 24.8 mL/min (A) (by C-G formula based on SCr of 1.4 mg/dL (H)). Liver Function Tests: Recent Labs  Lab 05/12/19 1032  AST 15  ALT 11  ALKPHOS 79  BILITOT 0.7  PROT 6.1*  ALBUMIN 3.6   No results for input(s): LIPASE, AMYLASE in the last 168 hours.  No results for input(s): AMMONIA in the last 168 hours. Coagulation Profile: Recent Labs  Lab 05/12/19 1032  INR 1.1   Cardiac Enzymes: No results for input(s): CKTOTAL, CKMB, CKMBINDEX, TROPONINI in the last 168 hours. BNP (last 3 results) No results for input(s): PROBNP in the last 8760 hours. HbA1C: Recent Labs    05/13/19 0632  HGBA1C 6.0*   CBG: Recent Labs  Lab 05/12/19 1049 05/13/19 0145 05/13/19 1718  GLUCAP 114* 97 90   Lipid Profile: Recent Labs    05/13/19 0632  CHOL 203*  HDL 29*  LDLCALC 129*  TRIG 226*  CHOLHDL 7.0   Thyroid Function Tests: No results for input(s): TSH, T4TOTAL, FREET4, T3FREE, THYROIDAB in the last 72 hours. Anemia Panel: No results for input(s): VITAMINB12, FOLATE, FERRITIN, TIBC, IRON, RETICCTPCT in the last 72 hours. Sepsis Labs: No results for input(s): PROCALCITON, LATICACIDVEN in the last 168 hours.  Recent Results (from the past 240 hour(s))  SARS  Coronavirus 2 (CEPHEID - Performed in [place] hospital lab), Hosp Order     Status: None   Collection Time: 05/12/19  3:36 PM   Specimen: Nasopharyngeal Swab  Result Value Ref Range Status   SARS Coronavirus 2 NEGATIVE NEGATIVE Final    Comment: (NOTE) If result is NEGATIVE SARS-CoV-2 target nucleic acids are NOT DETECTED. The SARS-CoV-2 RNA is generally detectable in upper and lower  respiratory specimens during the acute phase of infection. The lowest  concentration of SARS-CoV-2 viral copies this assay can detect is 250  copies / mL. A negative result does not preclude SARS-CoV-2 infection  and should not be used as the sole basis for treatment or other  patient management decisions.  A negative result may occur with  improper specimen collection / handling, submission of specimen other  than nasopharyngeal swab, presence of viral mutation(s) within the  areas targeted by this assay, and inadequate number of viral copies  (<250 copies / mL). A negative result must be combined with clinical  observations, patient history, and epidemiological information. If result is POSITIVE SARS-CoV-2 target nucleic acids are DETECTED. The SARS-CoV-2 RNA is generally detectable in upper and lower  respiratory specimens dur ing the acute phase of infection.  Positive  results are indicative of active infection with SARS-CoV-2.  Clinical  correlation with patient history and other diagnostic information is  necessary to determine patient infection status.  Positive results do  not rule out bacterial infection or co-infection with other viruses. If result is PRESUMPTIVE POSTIVE SARS-CoV-2 nucleic acids MAY BE PRESENT.   A presumptive positive result was obtained on the submitted specimen  and confirmed on repeat testing.  While 2019 novel coronavirus  (SARS-CoV-2) nucleic acids may be present in the submitted sample  additional confirmatory testing may be necessary for epidemiological  and / or  clinical management purposes  to differentiate between  SARS-CoV-2 and other Sarbecovirus currently known to infect humans.  If clinically indicated additional testing with an alternate test  methodology [phone]) is advised. The SARS-CoV-2 RNA is generally  detectable in upper and lower respiratory sp ecimens during the acute  phase of infection. The expected result is Negative. Fact Sheet for Patients:  StrictlyIdeas.no Fact Sheet for Healthcare Providers: BankingDealers.co.za This test is not yet approved or cleared by the [place] FDA and has been authorized for detection and/or diagnosis of SARS-CoV-2 by FDA under an Emergency Use Authorization (EUA).  This EUA will remain in effect (meaning this test can be used) for the  duration of the COVID-19 declaration under Section 564(b)(1) of the Act, 21 U.S.C. section 360bbb-3(b)(1), unless the authorization is terminated or revoked sooner. Performed at [facility], [place] [address] [place], [place] [address]       Radiology Studies: No results found.    LOS: 3 days   Time spent: More than 50% of that time was spent in counseling and/or coordination of care.  Antonieta Pert, MD [facility]  05/15/2019, 11:42 AM

## 2019-05-15 NOTE — Progress Notes (Signed)
Inpatient Rehabilitation Admissions Coordinator  I contacted pt's son, [name] by phone to update him that his Dad did not have a good day today. Currently I am recommending SNF but will follow along for a few days to see if he progresses. Son is realistic that going home with [facility] at this level is not an option. Son would like to see how he progresses and make a venue choice over the next 24 to 48 hrs but understands that SNF is currnelty recommended.  Danne Baxter, RN, MSN Rehab Admissions Coordinator [phone] 05/15/2019 4:23 PM

## 2019-05-16 ENCOUNTER — Inpatient Hospital Stay (HOSPITAL_COMMUNITY): Payer: Medicare Other

## 2019-05-16 DIAGNOSIS — R5383 Other fatigue: Secondary | ICD-10-CM

## 2019-05-16 DIAGNOSIS — R531 Weakness: Secondary | ICD-10-CM

## 2019-05-16 DIAGNOSIS — Z515 Encounter for palliative care: Secondary | ICD-10-CM

## 2019-05-16 DIAGNOSIS — Z7189 Other specified counseling: Secondary | ICD-10-CM

## 2019-05-16 DIAGNOSIS — R1310 Dysphagia, unspecified: Secondary | ICD-10-CM

## 2019-05-16 LAB — COMPREHENSIVE METABOLIC PANEL
ALT: 12 U/L (ref 0–44)
AST: 16 U/L (ref 15–41)
Albumin: 2.9 g/dL — ABNORMAL LOW (ref 3.5–5.0)
Alkaline Phosphatase: 61 U/L (ref 38–126)
Anion gap: 10 (ref 5–15)
BUN: 15 mg/dL (ref 8–23)
CO2: 23 mmol/L (ref 22–32)
Calcium: 8.9 mg/dL (ref 8.9–10.3)
Chloride: 110 mmol/L (ref 98–111)
Creatinine, Ser: 1.61 mg/dL — ABNORMAL HIGH (ref 0.61–1.24)
GFR calc Af Amer: 41 mL/min — ABNORMAL LOW (ref >60)
GFR calc non Af Amer: 35 mL/min — ABNORMAL LOW (ref >60)
Glucose, Bld: 124 mg/dL — ABNORMAL HIGH (ref 70–99)
Potassium: 4.1 mmol/L (ref 3.5–5.1)
Sodium: 143 mmol/L (ref 135–145)
Total Bilirubin: 1.3 mg/dL — ABNORMAL HIGH (ref 0.3–1.2)
Total Protein: 5.4 g/dL — ABNORMAL LOW (ref 6.5–8.1)

## 2019-05-16 LAB — CBC WITH DIFFERENTIAL/PLATELET
Abs Immature Granulocytes: 0.03 10*3/uL (ref 0.00–0.07)
Basophils Absolute: 0 10*3/uL (ref 0.0–0.1)
Basophils Relative: 0 %
Eosinophils Absolute: 0.1 10*3/uL (ref 0.0–0.5)
Eosinophils Relative: 2 %
HCT: 41.1 % (ref 39.0–52.0)
Hemoglobin: 13.2 g/dL (ref 13.0–17.0)
Immature Granulocytes: 0 %
Lymphocytes Relative: 15 %
Lymphs Abs: 1.3 10*3/uL (ref 0.7–4.0)
MCH: 31.4 pg (ref 26.0–34.0)
MCHC: 32.1 g/dL (ref 30.0–36.0)
MCV: 97.6 fL (ref 80.0–100.0)
Monocytes Absolute: 1.1 10*3/uL — ABNORMAL HIGH (ref 0.1–1.0)
Monocytes Relative: 13 %
Neutro Abs: 6.3 10*3/uL (ref 1.7–7.7)
Neutrophils Relative %: 70 %
Platelets: 181 10*3/uL (ref 150–400)
RBC: 4.21 MIL/uL — ABNORMAL LOW (ref 4.22–5.81)
RDW: 12.6 % (ref 11.5–15.5)
WBC: 8.9 10*3/uL (ref 4.0–10.5)
nRBC: 0 % (ref 0.0–0.2)

## 2019-05-16 LAB — GLUCOSE, CAPILLARY: Glucose-Capillary: 117 mg/dL — ABNORMAL HIGH (ref 70–99)

## 2019-05-16 MED ORDER — DEXTROSE-NACL 5-0.9 % IV SOLN
INTRAVENOUS | Status: AC
  Administered 2019-05-16: 14:00:00 via INTRAVENOUS

## 2019-05-16 NOTE — Progress Notes (Signed)
STROKE TEAM PROGRESS NOTE   INTERVAL HISTORY Stroke team was asked to reevaluate patient due to neurological decline with depressed sensorium and increased right hemiparesis.  A stat CT scan of the head was obtained this morning which shows no acute abnormality.  As per patient's RN he has not been eating well and ate only 50% of his meals last 2 days.  He has not been febrile and basic lab work has been unremarkable Vitals:   05/15/19 2323 05/16/19 0435 05/16/19 0816 05/16/19 1100  BP: (!) 146/69 119/75 130/76 (P) 134/76  Pulse: 99 96 93   Resp: 20 18 16  (P) 16  Temp: 98.3 F (36.8 C) 98.3 F (36.8 C) 98.2 F (36.8 C) (P) 98.2 F (36.8 C)  TempSrc: Oral  Axillary (P) Axillary  SpO2: 96% 91% 91% (P) 90%  Weight:  66.2 kg    Height:        CBC:  Recent Labs  Lab 05/12/19 1032 05/12/19 1100 05/14/19 0752  WBC 7.7  --  6.2  NEUTROABS 4.7  --   --   HGB 14.1 14.6 12.9*  HCT 44.0 43.0 39.7  MCV 98.7  --  97.8  PLT 232  --  161    Basic Metabolic Panel:  Recent Labs  Lab 05/12/19 1032 05/12/19 1100 05/14/19 0752  NA 138 139 145  K 4.3 4.3 3.8  CL 104 105 116*  CO2 24  --  22  GLUCOSE 132* 124* 96  BUN 19 20 14   CREATININE 1.57* 1.60* 1.40*  CALCIUM 9.6  --  8.5*   Lipid Panel:     Component Value Date/Time   CHOL 203 (H) 05/13/2019 0632   TRIG 226 (H) 05/13/2019 0632   HDL 29 (L) 05/13/2019 0632   CHOLHDL 7.0 05/13/2019 0632   VLDL 45 (H) 05/13/2019 0632   LDLCALC 129 (H) 05/13/2019 0960   HgbA1c:  Lab Results  Component Value Date   HGBA1C 6.0 (H) 05/13/2019   Urine Drug Screen: No results found for: LABOPIA, COCAINSCRNUR, LABBENZ, AMPHETMU, THCU, LABBARB  Alcohol Level No results found for: ETH  IMAGING Ct Head Wo Contrast  Result Date: 05/16/2019 CLINICAL DATA:  Altered level of consciousness, follow-up stroke EXAM: CT HEAD WITHOUT CONTRAST TECHNIQUE: Contiguous axial images were obtained from the base of the skull through the vertex without  intravenous contrast. COMPARISON:  05/12/2019 FINDINGS: Brain: No evidence of acute infarction, hemorrhage, hydrocephalus, extra-axial collection or mass lesion/mass effect. Mild periventricular white matter hypodensity and global volume loss. Vascular: No hyperdense vessel or unexpected calcification. Skull: Normal. Negative for fracture or focal lesion. Sinuses/Orbits: No acute finding.  Bilateral scleral banding. Other: Large pannus of the transverse ligament of atlantoaxial articulation with compression of the upper cervical spinal cord, as better demonstrated by prior MRI. IMPRESSION: 1. No acute intracranial pathology. No non-contrast CT evidence of involving infarction or hemorrhage. Mild small-vessel white matter disease and global volume loss in keeping with advanced patient age. 2. Large pannus of the transverse ligament of atlantoaxial articulation with compression of the upper cervical spinal cord, as better demonstrated by prior MRI. Electronically Signed   By: Eddie Candle M.D.   On: 05/16/2019 09:53    PHYSICAL EXAM Pleasant frail elderly Caucasian male not in distress. . Afebrile. Head is nontraumatic. Neck is supple without bruit.    Cardiac exam no murmur or gallop. Lungs are clear to auscultation. Distal pulses are well felt.  Redness at the right elbow at the previous IV site  with tenderness and pain on movement Neurological Exam ;  Drowsy but can be easily aroused..  Slightly dysarthric speech .mild decreased hearing bilaterally.  Eye movements full without nystagmus.fundi were not visualized. Vision acuity and fields appear normal.Palatal movements are normal.  Mild right lower facial weakness.. Tongue midline. Marked right hemiplegia with right upper extreme movements are limited due to severe pain in the right elbow and wrist.  Has 2/5 right lower extremity strength but effort is variable  Normal sensation. Gait deferred.  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 Following admission, had neuro worsening with minimal movement of RUE and new R facial weakness, improved with fluid bolus.   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 Small vessel disease. Atrophy. ASPECTS 10.   CT CS severe pannus posterior to the dens causing spinal stenosis and R cord compression  MRI  L paramedian pontine. Advanced small vessel disease. Atrophy.   MRA  Widespread intracranial atherosclerosis w/ mod to severe flow-limiting tandem stenoses, esp in the BA and L MCA M1  CTA head nonocclusive thrombus in L MCA M1 with occlusion of inferior division L M2 extending over 7 mm.  Essentially unchanged from previous MRI/MRA.  Occlusion of R V2 and V3, also unchanged.  Severe stenosis of BA & B PCAs.  Large amount of pannus dorsal to the odontoid process with spinal canal stenosis.  2D Echo normal EF  LDL 129  HgbA1c 6.0.  Lovenox 30 mg sq daily for VTE prophylaxis  aspirin 81 mg daily and clopidogrel 75 mg daily prior to admission but stopped 3 days prior to admission given bruising along R knee/thigh, now on aspirin 81 mg daily and clopidogrel 75 mg daily. X 3 months and then aspirin alone  Therapy recommendations: CLR  Disposition: CLR   Follow up with Dr. Delice Lesch  Hypertension  Stable . Permissive hypertension (OK if < 220/120) but gradually normalize in 5-7 days . Long-term BP goal normotensive  Hyperlipidemia  Home meds:  No statin  Now on Lipitor 40  LDL 129, goal < 70  Has refused statins in the past, including during last admission in February  Continue statin at discharge  Dysphagia   Secondary to stroke   N.p.o.   Speech therapy on board    other Stroke Risk Factors  Advanced age  Former  Cigarette smoker, quit 39 yrs ago  ETOH use, advised to drink no more than 2 drink(s) a day  Hx stroke/TIA - followed by Dr. Delice Lesch ? 2013 neg MRI and EEG ? 01/2015 slumped to R. EEG normal. MRI punctate cortical infarct posterior L parietal love ? 09/2015 B leg weakness slurred speech. MRI sign IC ant and post circ ? 06/26/2016 loss of consciousness VB TIA ? 05/2018 memory loss, difficulty completing complex tasks c/w mild dementia ? 12/2018 - Incidental left occipital infarct secondary to large vessel disease source. Official dx BPPV as cause of dizziness  Chronic diastolic Congestive heart failure  Other Active Problems  Chronic OS blindness  RA  Myelopathy with acute C-spine abnormality, compression of cervical cord C2 -discussed during previous admission.  Can consider outpatient neurosurgical follow-up however patient is likely not a surgical candidate-age and refusal  Mild AKI on  CKD stage III  GERD  Hypothyroidism  Hospital day # 4 Patient has had worsening of right hemiparesis but exam seems limited by patient cooperation due to pain CT scan shows no acute abnormality.  Recommend cautious IV hydration and reconsider goals of care.  Palliative care team has been consulted.  Continue aspirin and Plavix for 3 months followed by aspirin alone.  Aggressive risk factor modification.  Discussed with Dr. Maren Beach.  Greater than 50% time during this 25-minute visit was spent on counseling and coordination of care about stroke and neurological worsening and answering questions Antony Contras, MD Medical Director [place] Pager: [phone] 05/16/2019 1:48 PM   To contact Stroke Continuity provider, please refer to [email or web address] After hours, contact General Neurology

## 2019-05-16 NOTE — Progress Notes (Signed)
PROGRESS NOTE    Robert Rowe  HBZ:[id number] DOB: Oct 24, 1918 DOA: 05/12/2019 PCP: [name], MD   Brief Narrative: 85 year old male, World War II veteran, retired physician with history of hyperlipidemia, hypothyroidism, GERD, CKD stage III, diastolic CHF, RA, history of prior TIAs on aspirin and Plavix at baseline presented to ED with right-sided weakness which he noted a night before the admission.  Patient reported he went to bed feeling okay but woke up in the middle of the night with restlessness, noticed that he could not move his right side well.  Patient also reported numbness in the right side of his face.  Family noted slurred speech next morning and patient was brought to ED with code stroke. Patient had stopped taking both aspirin and Plavix 3 days prior to admission as he noticed bruising along the right knee/thigh area. MRI brain revealed acute CVA in the left paramedian pons, patient was admitted for further work-up. Neurology was consulted. Patient found to have acute infarction of left paramedian pons with advanced atrophy Followed by PT OT SLP.  Subjective:  Seen and examined this morning.  Nursing reports patient appears sleepy but was able to answer all questions appropriately in complete NIH scale.  Patient was able to tell me his name able to move his left extremity but appears very weak and frail. On Room Air, no fever. Last wbc count stable- today labs pending.   Assessment & Plan:   Acute CVA with right-sided weakness dysarthria numbness on the right face and MRI showing acute infarction of left paramedian pons with advanced atrophy and mild SVD: Seen by neurology underwent extensive work-up with MRA brain-has widespread intracranial atherosclerosis with moderate to severe flow-limiting tandem stenosis especially in the BA and L MCA M1, blood work-up with LDL 129, hba1c 6.0 HBA1C echo with EF more than 65% w/ impaired relaxation.  Seen by neurology and signed  off . He was on ASA 81 mg daily and clopidogrel 75 mg daily but WAS stopped 3 days prior to admission given bruising along R knee/thigh. Neuro recommended to cont ASA/PLAVIX X 3 months then ASA 81 alone, cont lipitor ( he had Refused statin in the past including last admission in February). Cont PT/OT/SLP- awaiting CIR but appears very weak to go to CIR and will likely need a skilled nursing facility.  [place] has been drowsy and groggy, cont PT/OTSLP.  Code stroke on 6/22 with rapid response  for right-sided weakness and slurred speech and right arm drop and had CT angiogram that showed nonocclusive thrombus in the proximal M1 segment of left MCA, occlusion of inferior division of left M2 segment at MCA bifurcation, unchanged from previous imaging. Cont plan as per #1.  Monitor patient closely.  Intermittent lethargy/weakness with debility/deconditioning:In the setting of acute stroke as above. Monitor  the patient closely, patient is high risk for decompensation.I have requested palliative care to discuss about goals of care/CODE STATUS given his advanced age and significant stroke as above. Stat CT obtained by neuro and no acute findings 6/25-neuro suspecting deconditioning. I have ordered CXR to r/o pneumonia, added gentle ivf as his intake is not good. CXR retrocardiac opacity?pneumomia vs atelectasis vs form hiatal hernia- will obtain ct chest.  Myelopathy with acute C-spine abnormality, compression of cervical cord C2: S/P MRI of the C-spine, which showed severe pannus resulting in compression of the cervical cord, right greater than left opposite the mid C2 level. SEEN BY Neurosurgery Dr Arnoldo Morale, not likely candidate for any surgery  given advanced age, acute CVA, multiple morbidities. Cont supportive care pain control PT OT.  Dysarthria in the setting of stroke continue  SLP follow up. Cont aspiration precautions.  Dyslipidemia: on statins  GERD-on ppi  Chronic diastolic CHF: Currently  euvolemic.  Essential hypertension: Allow permissive hypertension with history.  Blood pressure stable  CKD stage 3 w mild AKI- Creat stable, baseline 1.4-1.6 Recent Labs  Lab 05/12/19 1032 05/12/19 1100 05/14/19 0752  BUN 19 20 14   CREATININE 1.57* 1.60* 1.40*   Hypothyroidism:Continue Synthroid.  COVID 19 test negative 6/21  Code Status:Full code. DVT Prophylaxis:SQ Lovenox Family Communication: Requested palliative care as patient not progressing well doing intermittent deconditioning/lethargy.  He was however able to work-up follow commands and move his extremities with increased weakness on the right side.  Palliative care discussed with family and they are going to have family meeting today. I did talk to his son and wife after meeting. Discussed about overall situation. Patient seems to have gotten more alert,awke and ate after family visited. They want to actively pursue CIR at this time.  Disposition Plan: Awaiting on CIR placement. Likely not a candidate may need to skilled nursing facility.  Procedures: MRI/MRA:MRI of the brain->acute nonhemorrhagic infarction affecting the left paramedian pons, advanced atrophy and mild small vessel disease. -MRA moderate to severe flow-limiting stenosis in basilar artery, high-grade stenosis of the left M1 MCA.  Consultants:   Neurology Neurosurgery, Dr. Arnoldo Morale  Antimicrobials: Anti-infectives (From admission, onward)   None       Objective: Vitals:   05/15/19 1916 05/15/19 2323 05/16/19 0435 05/16/19 0816  BP: 119/65 (!) 146/69 119/75 130/76  Pulse: (!) 102 99 96 93  Resp: 19 20 18 16   Temp: 98.3 F (36.8 C) 98.3 F (36.8 C) 98.3 F (36.8 C) 98.2 F (36.8 C)  TempSrc: Oral Oral  Axillary  SpO2: 92% 96% 91% 91%  Weight:   66.2 kg   Height:        Intake/Output Summary (Last 24 hours) at 05/16/2019 1127 Last data filed at 05/16/2019 0900 Gross per 24 hour  Intake 30 ml  Output 800 ml  Net -770 ml   Filed  Weights   05/12/19 1931 05/16/19 0435  Weight: 66.9 kg 66.2 kg   Weight change:   Body mass index is 26.69 kg/m.  Intake/Output from previous day: 06/24 0701 - 06/25 0700 In: -  Out: 800 [Urine:800] Intake/Output this shift: Total I/O In: 30 [P.O.:30] Out: -   Examination:  General exam: Patient's somewhat sleepy, wakes up able to tell me his name, is elderly and frail. HEENT:PERRL,Oral mucosa moist, Ear/Nose normal on gross exam Respiratory system: Bilateral equal air entry, normal vesicular breath sounds, no wheezes or crackles  Cardiovascular system: S1 & S2 heard,No JVD, murmurs. Gastrointestinal system: Abdomen is  soft, non tender, non distended, BS +  Nervous System:follows commands/weak frail, weakness noted on the right side mostly and worse from prior. Extremities: No edema, no clubbing, distal peripheral pulses palpable. Skin: No rashes, lesions, no icterus MSK: Normal muscle bulk,tone ,power  Medications:  Scheduled Meds:   stroke: mapping our early stages of recovery book   Does not apply Once   aspirin EC  81 mg Oral Daily   atorvastatin  40 mg Oral q1800   clopidogrel  75 mg Oral Daily   enoxaparin (LOVENOX) injection  30 mg Subcutaneous Q24H   latanoprost  1 drop Both Eyes QHS   levothyroxine  50 mcg Oral QAC breakfast  multivitamin with minerals  1 tablet Oral Daily   pantoprazole  40 mg Oral Daily   sodium chloride flush  3 mL Intravenous Once   Continuous Infusions:   Data Reviewed: I have personally reviewed following labs and imaging studies  CBC: Recent Labs  Lab 05/12/19 1032 05/12/19 1100 05/14/19 0752  WBC 7.7  --  6.2  NEUTROABS 4.7  --   --   HGB 14.1 14.6 12.9*  HCT 44.0 43.0 39.7  MCV 98.7  --  97.8  PLT 232  --  053   Basic Metabolic Panel: Recent Labs  Lab 05/12/19 1032 05/12/19 1100 05/14/19 0752  NA 138 139 145  K 4.3 4.3 3.8  CL 104 105 116*  CO2 24  --  22  GLUCOSE 132* 124* 96  BUN 19 20 14    CREATININE 1.57* 1.60* 1.40*  CALCIUM 9.6  --  8.5*   GFR: Estimated Creatinine Clearance: 24.7 mL/min (A) (by C-G formula based on SCr of 1.4 mg/dL (H)). Liver Function Tests: Recent Labs  Lab 05/12/19 1032  AST 15  ALT 11  ALKPHOS 79  BILITOT 0.7  PROT 6.1*  ALBUMIN 3.6   No results for input(s): LIPASE, AMYLASE in the last 168 hours. No results for input(s): AMMONIA in the last 168 hours. Coagulation Profile: Recent Labs  Lab 05/12/19 1032  INR 1.1   Cardiac Enzymes: No results for input(s): CKTOTAL, CKMB, CKMBINDEX, TROPONINI in the last 168 hours. BNP (last 3 results) No results for input(s): PROBNP in the last 8760 hours. HbA1C: No results for input(s): HGBA1C in the last 72 hours. CBG: Recent Labs  Lab 05/12/19 1049 05/13/19 0145 05/13/19 1718  GLUCAP 114* 97 90   Lipid Profile: No results for input(s): CHOL, HDL, LDLCALC, TRIG, CHOLHDL, LDLDIRECT in the last 72 hours. Thyroid Function Tests: No results for input(s): TSH, T4TOTAL, FREET4, T3FREE, THYROIDAB in the last 72 hours. Anemia Panel: No results for input(s): VITAMINB12, FOLATE, FERRITIN, TIBC, IRON, RETICCTPCT in the last 72 hours. Sepsis Labs: No results for input(s): PROCALCITON, LATICACIDVEN in the last 168 hours.  Recent Results (from the past 240 hour(s))  SARS Coronavirus 2 (CEPHEID - Performed in [place] hospital lab), Hosp Order     Status: None   Collection Time: 05/12/19  3:36 PM   Specimen: Nasopharyngeal Swab  Result Value Ref Range Status   SARS Coronavirus 2 NEGATIVE NEGATIVE Final    Comment: (NOTE) If result is NEGATIVE SARS-CoV-2 target nucleic acids are NOT DETECTED. The SARS-CoV-2 RNA is generally detectable in upper and lower  respiratory specimens during the acute phase of infection. The lowest  concentration of SARS-CoV-2 viral copies this assay can detect is 250  copies / mL. A negative result does not preclude SARS-CoV-2 infection  and should not be used as the  sole basis for treatment or other  patient management decisions.  A negative result may occur with  improper specimen collection / handling, submission of specimen other  than nasopharyngeal swab, presence of viral mutation(s) within the  areas targeted by this assay, and inadequate number of viral copies  (<250 copies / mL). A negative result must be combined with clinical  observations, patient history, and epidemiological information. If result is POSITIVE SARS-CoV-2 target nucleic acids are DETECTED. The SARS-CoV-2 RNA is generally detectable in upper and lower  respiratory specimens dur ing the acute phase of infection.  Positive  results are indicative of active infection with SARS-CoV-2.  Clinical  correlation with patient  history and other diagnostic information is  necessary to determine patient infection status.  Positive results do  not rule out bacterial infection or co-infection with other viruses. If result is PRESUMPTIVE POSTIVE SARS-CoV-2 nucleic acids MAY BE PRESENT.   A presumptive positive result was obtained on the submitted specimen  and confirmed on repeat testing.  While 2019 novel coronavirus  (SARS-CoV-2) nucleic acids may be present in the submitted sample  additional confirmatory testing may be necessary for epidemiological  and / or clinical management purposes  to differentiate between  SARS-CoV-2 and other Sarbecovirus currently known to infect humans.  If clinically indicated additional testing with an alternate test  methodology [phone]) is advised. The SARS-CoV-2 RNA is generally  detectable in upper and lower respiratory sp ecimens during the acute  phase of infection. The expected result is Negative. Fact Sheet for Patients:  StrictlyIdeas.no Fact Sheet for Healthcare Providers: BankingDealers.co.za This test is not yet approved or cleared by the [place] FDA and has been authorized for detection  and/or diagnosis of SARS-CoV-2 by FDA under an Emergency Use Authorization (EUA).  This EUA will remain in effect (meaning this test can be used) for the duration of the COVID-19 declaration under Section 564(b)(1) of the Act, 21 U.S.C. section 360bbb-3(b)(1), unless the authorization is terminated or revoked sooner. Performed at [facility], [place] [address] [place], [place] [address]       Radiology Studies: Ct Head Wo Contrast  Result Date: 05/16/2019 CLINICAL DATA:  Altered level of consciousness, follow-up stroke EXAM: CT HEAD WITHOUT CONTRAST TECHNIQUE: Contiguous axial images were obtained from the base of the skull through the vertex without intravenous contrast. COMPARISON:  05/12/2019 FINDINGS: Brain: No evidence of acute infarction, hemorrhage, hydrocephalus, extra-axial collection or mass lesion/mass effect. Mild periventricular white matter hypodensity and global volume loss. Vascular: No hyperdense vessel or unexpected calcification. Skull: Normal. Negative for fracture or focal lesion. Sinuses/Orbits: No acute finding.  Bilateral scleral banding. Other: Large pannus of the transverse ligament of atlantoaxial articulation with compression of the upper cervical spinal cord, as better demonstrated by prior MRI. IMPRESSION: 1. No acute intracranial pathology. No non-contrast CT evidence of involving infarction or hemorrhage. Mild small-vessel white matter disease and global volume loss in keeping with advanced patient age. 2. Large pannus of the transverse ligament of atlantoaxial articulation with compression of the upper cervical spinal cord, as better demonstrated by prior MRI. Electronically Signed   By: Eddie Candle M.D.   On: 05/16/2019 09:53      LOS: 4 days   Time spent: More than 50% of that time was spent in counseling and/or coordination of care.  Antonieta Pert, MD [facility]  05/16/2019, 11:27 AM

## 2019-05-16 NOTE — Progress Notes (Signed)
  Speech Language Pathology Treatment: Dysphagia  Patient Details Name: Robert Rowe MRN: [id number] DOB: 1918-07-05 Today's Date: 05/16/2019 Time: 1535-1600 SLP Time Calculation (min) (ACUTE ONLY): 25 min  Assessment / Plan / Recommendation Clinical Impression  Patient was seen to address dysphagia goals as well as dysarthria goals. His wife and son came to visit this afternoon and this really helped to perk him up. Prior to SLP arriving, patient had eaten a cup of applesauce and drank 4 ounces of orange juice. When SLP present, he drank approximately 2 ounces of orange juice. He did exhibit mild intensity of delayed coughing, but per RN, he had a breathing treatment just prior to PO intake. Also per RN, patient is requesting puree solids, as he does not want to have to chew much.  SLP worked with patient on speech/dysarthria goals after his wife and son left. He was starting to get tired, but was able to participate in brief session. SLP instructed patient on how to use incentive spirometer and then demonstrated and instructed him on speaking louder and slower and opening mouth wider to improve overall intelligibility. In addition, patient's speech is impacted by his decreased respiratory support for phonation. He was able to improve speech intelligibility minimally with mod-maximal cues to reinforce strategies, as patient was also getting fatigued and starting to fall asleep.    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  Patient reported he went to bed feeling okay but woke up in the middle of the night with restlessness, noticed that he could not move his right side well.  Patient also reported numbness in the right side of his face.   Family noted slurred speech next morning and patient was brought to ED with code stroke. MRI brain revealed acute CVA in the left paramedian pons, patient was admitted for further work-up. Pt had worsening deficits overnight.      SLP Plan          Recommendations  Diet recommendations: Regular;Thin liquid Liquids provided via: Cup;Straw Medication Administration: Whole meds with liquid Supervision: Patient able to self feed;Staff to assist with self feeding;Intermittent supervision to cue for compensatory strategies Compensations: Slow rate;Small sips/bites Postural Changes and/or Swallow Maneuvers: Seated upright 90 degrees                        GO                Robert Rowe 05/16/2019, 4:58 PM    Robert Baller, MA, CCC-SLP Speech Therapy [facility] Acute Rehab Pager: [phone]

## 2019-05-16 NOTE — Progress Notes (Signed)
SLP Cancellation Note  Patient Details Name: Robert Rowe MRN: [id number] DOB: November 06, 1918   Cancelled treatment:       Reason Eval/Treat Not Completed: Patient declined, no reason specified;Fatigue/lethargy limiting ability to participate. Patient is lethargic and when awakened, tells SLP that he feels tired and "worn out". Per recent notes from MD and therapy, patient has been exhibiting a decline in alertness since yesterday. Will continue to follow for readiness.    Nadara Mode Tarrell 05/16/2019, 2:53 PM   Sonia Baller, MA, CCC-SLP Speech Therapy [facility] Acute Rehab Pager: [phone]

## 2019-05-16 NOTE — Consult Note (Signed)
Consultation Note Date: 05/16/2019   Patient Name: Robert Rowe  DOB: [birth year]  MRN: [id number]  Age / Sex: 85 y.o., male  PCP: [name], MD Referring Physician: Antonieta Pert, MD  Reason for Consultation: Establishing goals of care and Psychosocial/spiritual support  HPI/Patient Profile: 85 y.o. male   admitted on 05/12/2019 is a   World War II veteran, retired physician with past medical history of hyperlipidemia, hypothyroidism, GERD, CKD stage III, diastolic CHF, RA, history of prior TIAs on aspirin and Plavix at baseline presented to ED with right-sided weakness which he noted a night before the admission. Code stroke called.  Patient had stopped taking both aspirin and Plavix 3 days prior to admission as he noticed bruising along the right knee/thigh area.  MRI brain revealed acute CVA in the left paramedian pons, patient was admitted for further work-up. Neurology involved.   COVID 19 test negative.  Patient remains intermittently confused and with poor p.o. intake.  Family face treatment option decisions, advanced directive decisions and anticipatory care needs.   Clinical Assessment and Goals of Care:  This NP Wadie Lessen reviewed medical records, received report from team, assessed the patient and then spoke to son by telephone  to discuss diagnosis, prognosis, GOC, EOL wishes disposition and options.  Was able to have son and wife visit at bedside for continued conversation.  Concept of Hospice and Palliative Care were discussed  A detailed discussion was had today regarding advanced directives.  Concepts specific to code status, artifical feeding and hydration, continued IV antibiotics and rehospitalization was had.  The difference between a aggressive medical intervention path  and a palliative comfort care path for this patient at this time was had.  Values and goals of care  important to patient and family were attempted to be elicited.  MOST form introduced and Hard Choices left with family to review   Questions and concerns addressed.   Family encouraged to call with questions or concerns.    PMT will continue to support holistically.    HCPOA/wife with support from son Jashawn Floyd    SUMMARY OF RECOMMENDATIONS    Code Status/Advance Care Planning:  Full code   Encouraged family to consider DNR/DNI status understanding poor outcomes in similar patients.  Wife tells me she believes that they have a documented living will and she will search for it today to help with decision-making.   Palliative Prophylaxis:   Aspiration, Bowel Regimen, Delirium Protocol and Oral Care  Additional Recommendations (Limitations, Scope, Preferences):  Full Scope Treatment--family is open to all offered and available medical interventions to prolong life  Psycho-social/Spiritual:   Desire for further Chaplaincy support:no  Additional Recommendations: Created space and opportunity for family to share their thoughts and feelings about current medical situation and the reality of human mortality and the limitations of medical interventions when the body begins to fail to thrive.   Prognosis:   Unable to determine  Discharge Planning: Family is very hopeful for inpatient CIR rehabilitation opportunity.  If however  he does not meet eligibility they are prepared to take him home with his long-term care policy and home health.   They will augment care with 24-hour caregivers    To Be Determined      Primary Diagnoses: Present on Admission:  CVA (cerebral vascular accident) ([facility])  Chronic diastolic CHF (congestive heart failure) (HCC)  CKD (chronic kidney disease) stage 3, GFR 30-59 ml/min (HCC)  Dyslipidemia  Essential hypertension  GERD   I have reviewed the medical record, interviewed the patient and family, and examined the patient. The following  aspects are pertinent.  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 Echo 12/16: mild LVH, EF 55-60%, no RWMA, Gr 1 DD, mild AI, MAC, mild LAE, normal RVF, PASP 37 mmHg   Chronic kidney disease    chronic    DJD (degenerative joint disease)    Dysrhythmia    pvc's    GERD 12/03/2007   Hiatal hernia    History of nuclear stress test    Myoview 1/17: EF 53%, normal perfusion; Low Risk   HYPERLIPIDEMIA 04/05/2007   Hypothyroidism    Osteoarth NOS-Unspec 04/05/2007   Palpitations 02/12/2010   PEDAL EDEMA 05/01/2008   Rheumatoid arthritis(714.0) 04/05/2007   Shortness of breath dyspnea    TRANSIENT ISCHEMIC ATTACK, HX OF 04/05/2007   ?    Social History   Socioeconomic History   Marital status: Married    Spouse name: Not on file   Number of children: Not on file   Years of education: Not on file   Highest education level: Not on file  Occupational History   Not on file  Social Needs   Financial resource strain: Not on file   Food insecurity    Worry: Not on file    Inability: Not on file   Transportation needs    Medical: Not on file    Non-medical: Not on file  Tobacco Use   Smoking status: Former Smoker    Types: Cigarettes    Quit date: 08/13/1953    Years since quitting: 65.8   Smokeless tobacco: Never Used   Tobacco comment: 1942-1954 up to 1 pack/day  Substance and Sexual Activity   Alcohol use: Yes    Comment: occ vodka   Drug use: No   Sexual activity: Not on file  Lifestyle   Physical activity    Days per week: Not on file    Minutes per session: Not on file   Stress: Not on file  Relationships   Social connections    Talks on phone: Not on file    Gets together: Not on file    Attends religious service:  Not on file    Active member of club or organization: Not on file    Attends meetings of clubs or organizations: Not on file    Relationship status: Not on file  Other Topics Concern   Not on file  Social History Narrative   Not on file   Family History  Problem Relation Age of Onset   Breast cancer Mother    Heart disease Father    Heart attack Father    Lymphoma Brother    Stroke Neg Hx    Hypertension Neg Hx    Scheduled Meds:  stroke: mapping our early stages of recovery book   Does not apply Once   aspirin EC  81 mg Oral Daily   atorvastatin  40 mg Oral q1800   clopidogrel  75 mg Oral Daily   enoxaparin (LOVENOX) injection  30 mg Subcutaneous Q24H   latanoprost  1 drop Both Eyes QHS   levothyroxine  50 mcg Oral QAC breakfast   multivitamin with minerals  1 tablet Oral Daily   pantoprazole  40 mg Oral Daily   sodium chloride flush  3 mL Intravenous Once   Continuous Infusions: PRN Meds:.acetaminophen, albuterol, hydrALAZINE, meclizine, nitroGLYCERIN, senna-docusate, traMADol Medications Prior to Admission:  Prior to Admission medications   Medication Sig Start Date End Date Taking? Authorizing Provider  acetaminophen (TYLENOL) 500 MG tablet Take 1,000 mg by mouth every morning.   Yes [provider]  aspirin EC 81 MG tablet Take 81 mg by mouth daily.   Yes [provider]  clopidogrel (PLAVIX) 75 MG tablet Take 1 tablet (75 mg total) by mouth daily. 01/10/19  Yes Angiulli, Lavon Paganini, PA-C  furosemide (LASIX) 20 MG tablet TAKE 1 TABLET BY MOUTH  DAILY AS NEEDED FOR FLUID  OR EDEMA. Patient taking differently: Take 40 mg by mouth daily as needed for fluid or edema.  07/02/18  Yes Hilty, Nadean Corwin, MD  latanoprost (XALATAN) 0.005 % ophthalmic solution Place 1 drop into both eyes at bedtime. For Glaucoma 02/08/18  Yes [provider]  levothyroxine (SYNTHROID, LEVOTHROID) 50 MCG tablet Take 50 mcg by mouth daily before breakfast. For  Hypothyroidism   Yes [provider]  meclizine (ANTIVERT) 12.5 MG tablet Take 1 tablet (12.5 mg total) by mouth 3 (three) times daily as needed for dizziness. 01/10/19  Yes Angiulli, Lavon Paganini, PA-C  metoprolol succinate (TOPROL-XL) 25 MG 24 hr tablet Take 1 tablet (25 mg total) by mouth at bedtime. 01/10/19  Yes Angiulli, Lavon Paganini, PA-C  Multiple Vitamin (MULTIVITAMIN WITH MINERALS) TABS tablet Take 1 tablet by mouth daily with breakfast.    Yes [provider]  nitroGLYCERIN (NITROSTAT) 0.4 MG SL tablet Place 0.4 mg under the tongue every 5 (five) minutes as needed (for Esophageal spasms).    Yes [provider]  pantoprazole (PROTONIX) 40 MG tablet Take 1 tablet (40 mg total) by mouth daily. 01/10/19  Yes Angiulli, Lavon Paganini, PA-C  triamcinolone cream (KENALOG) 0.1 % Apply to affected rash bid prn. Patient taking differently: Apply 1 application topically 2 (two) times daily as needed (to rash on head/scalp).  03/15/19  Yes Burchette, Alinda Sierras, MD  albuterol (PROVENTIL HFA;VENTOLIN HFA) 108 (90 Base) MCG/ACT inhaler Inhale 2 puffs into the lungs every 4 (four) hours as needed for wheezing or shortness of breath. Patient not taking: Reported on 05/12/2019 11/02/18   Laurey Morale, MD  magnesium hydroxide (MILK OF MAGNESIA) 400 MG/5ML suspension Constipation (1 of 4 ) If no BM in 3 days, give 30 cc Milk of Magnesium p.o. x 1 dose in 24 hours as needed ( Do  Not use standing constipation orders for residents with renal failure CFR less than 30. Contact MD for orders)    [provider]  traMADol (ULTRAM) 50 MG tablet Take 1 tablet (50 mg total) by mouth every 6 (six) hours as needed. Patient not taking: Reported on 05/12/2019 02/15/19   [name], MD   Allergies  Allergen Reactions   Codeine Other (See Comments)    Patient was "all over the place"- undesired  side effect   Sulfasalazine Other (See Comments)    Reaction not recalled   Sulfonamide Derivatives  Other (See Comments)    Reaction not recalled   Review of Systems  Unable to perform ROS: Mental status change    Physical Exam Constitutional:      Appearance: He is normal weight.     Interventions: Nasal cannula in place.  Cardiovascular:     Rate and Rhythm: Normal rate and regular rhythm.     Heart sounds: Normal heart sounds.  Skin:    General: Skin is warm and dry.  Neurological:     Mental Status: He is lethargic.     Motor: Weakness present.     Vital Signs: BP 130/76    Pulse 93    Temp 98.2 F (36.8 C) (Axillary)    Resp 16    Ht _0  (1.575 m)    Wt 66.2 kg    SpO2 91%    BMI 26.69 kg/m  Pain Scale: 0-10   Pain Score: 0-No pain   SpO2: SpO2: 91 % O2 Device:SpO2: 91 % O2 Flow Rate: .   IO: Intake/output summary:   Intake/Output Summary (Last 24 hours) at 05/16/2019 1206 Last data filed at 05/16/2019 0900 Gross per 24 hour  Intake 30 ml  Output 800 ml  Net -770 ml    LBM: Last BM Date: 05/11/19 Baseline Weight: Weight: 66.9 kg Most recent weight: Weight: 66.2 kg     Palliative Assessment/Data: 30 %   Discussed with Dr Lupita Leash and Pamala Hurry RN/CIR  Made the family aware that this nurse practitioner will not be in the hospital again until Monday however if they have questions or concerns they can call the team phone for support and assistance  Time In: 1400 Time Out: 1515 Time Total: 75 minutes Greater than 50%  of this time was spent counseling and coordinating care related to the above assessment and plan.  Signed by: Wadie Lessen, NP   Please contact Palliative Medicine Team phone at [phone] for questions and concerns.  For individual provider: See Shea Evans

## 2019-05-17 NOTE — Care Management Important Message (Signed)
Important Message  Patient Details  Name: Robert Rowe MRN: [id number] Date of Birth: [birth year]   Medicare Important Message Given:  Yes     Andrina Locken 05/17/2019, 3:01 PM

## 2019-05-17 NOTE — Progress Notes (Signed)
Pt stable during shift; slept through the night; decreased appetite and hand only few bites of diner; IV intact and transfusing; reported off to oncoming RN. Delia Heady RN

## 2019-05-17 NOTE — Progress Notes (Signed)
Inpatient Rehabilitation Admissions Coordinator  I met with patient at bedside. He is lethargic and unable to hold cup to drink his coffee and OJ. I assisted him once more alert. I contacted his son, [name], by phone to reiterate that pt continues to lack the ability for more intense therapies at this time. I continue to recommend SNF rehab as I discussed on the 24 th. He refuses SNF and states he will make arrangements to take his Dad home. RN CM, Vida Roller, made aware. We will sign off at this time.   Danne Baxter, RN, MSN Rehab Admissions Coordinator [phone] 05/17/2019 10:59 AM

## 2019-05-17 NOTE — Progress Notes (Signed)
  Speech Language Pathology Treatment: Dysphagia  Patient Details Name: Robert Rowe MRN: [id number] DOB: 04-18-1918 Today's Date: 05/17/2019 Time: 3254-9826 SLP Time Calculation (min) (ACUTE ONLY): 45 min  Assessment / Plan / Recommendation Clinical Impression  Patient seen to address dysphagia and speech goals. He was lethargic but arousable and able to maintain adequate alertness for majority of session with mod-maximal frequency of cues to redirect. Patient told SLP that his speech was not good today. SLP provided oral care and assisted with placement of upper dentures. Patient was able to achieve adequate vocal intensity and speech intelligibility was approximately 75% to trained listener. He required mod-maximal cues to speak louder and open mouth more, but as he fatigues quickly with minimal effort, this level of cueing was only provided for a short duration.  Patient also seen to address dysphagia goals with thin liquids via straw sips and cup sips and puree solids.via spoon bites. Patient was not able to feed himself or even hold cup without maximal assistance. He continues to exhibit oral holding of liquid and puree solid boluses prior to initiating a swallow and exhibits multiple swallows intermittently. No immediate overt s/s of aspiration or penetration but patient did exhibit two instances of delayed cough and throat clear but no expectoration. Chest xray from yesterday 6/25 did not reveal any indications of PNA. Of note, patient requested downgrade to puree solids because he didn't want to do much chewing. SLP in agreement as patient becomes easily and quickly fatigued during both PO intake and when participating in speech tx/conversations with SLP.    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  Patient reported he went to bed feeling okay but woke up in the middle of the night with restlessness, noticed that he could not move his right side well.  Patient also reported numbness in the right side of his face.  Family noted slurred speech next morning and patient was brought to ED with code stroke. MRI brain revealed acute CVA in the left paramedian pons, patient was admitted for further work-up. Pt had worsening deficits overnight.      SLP Plan  Continue with current plan of care       Recommendations  Diet recommendations: Dysphagia 1 (puree);Thin liquid Liquids provided via: Cup;Straw Medication Administration: Whole meds with puree Supervision: Full supervision/cueing for compensatory strategies;Trained caregiver to feed patient;Staff to assist with self feeding Compensations: Minimize environmental distractions;Slow rate;Small sips/bites Postural Changes and/or Swallow Maneuvers: Seated upright 90 degrees                Oral Care Recommendations: Oral care BID Follow up Recommendations: Skilled Nursing facility;Home health SLP SLP Visit Diagnosis: Dysphagia, unspecified (R13.10);Dysarthria and anarthria (R47.1) Plan: Continue with current plan of care       GO               Sonia Baller, MA, [facility] Pager: [phone]

## 2019-05-17 NOTE — Care Management (Addendum)
    Durable Medical Equipment  (From admission, onward)         Start     Ordered   05/17/19 1343  For home use only DME Hospital bed  Once    Question Answer Comment  Length of Need 6 Months   The above medical condition requires: Patient requires the ability to reposition frequently   Head must be elevated greater than: 45 degrees   Bed type Semi-electric      05/17/19 1342   05/17/19 1203  For home use only DME Other see comment  Once    Comments: Small Condom caths with foley bags  Question:  Length of Need  Answer:  Lifetime   05/17/19 1204         The patient suffers from pain in back which is caused by myelopathy with compression of cervical cord at C2. Hospital bed will alleviate pain by allowing his back, neck to be positioned in ways not feasible with a normal bed. Pain episodes frequently require changes in body position which can not be achieved with a normal bed.  Pt is also at risk for aspiration and needs the bed elevation to prevent choking.

## 2019-05-17 NOTE — Progress Notes (Signed)
STROKE TEAM PROGRESS NOTE   INTERVAL HISTORY  Patient continues to have dysarthria and right hemi-plegia which appears unchanged from yesterday.. No acute distress or complaints. Palliative team consulted and are planning for inpatient rehab and home health thereafter.  Vitals:   05/16/19 1738 05/16/19 1938 05/16/19 2319 05/17/19 0748  BP: 134/87 (!) 122/92 124/64 126/76  Pulse: 99 99 (!) 105 87  Resp: 17 17 17 17   Temp: (!) 97.3 F (36.3 C) 98 F (36.7 C) 99.5 F (37.5 C) 98.9 F (37.2 C)  TempSrc: Axillary Oral Oral Axillary  SpO2: 97% 100% 93% 92%  Weight:      Height:        CBC:  Recent Labs  Lab 05/12/19 1032  05/14/19 0752 05/16/19 1336  WBC 7.7  --  6.2 8.9  NEUTROABS 4.7  --   --  6.3  HGB 14.1   < > 12.9* 13.2  HCT 44.0   < > 39.7 41.1  MCV 98.7  --  97.8 97.6  PLT 232  --  192 181   < > = values in this interval not displayed.    Basic Metabolic Panel:  Recent Labs  Lab 05/14/19 0752 05/16/19 1336  NA 145 143  K 3.8 4.1  CL 116* 110  CO2 22 23  GLUCOSE 96 124*  BUN 14 15  CREATININE 1.40* 1.61*  CALCIUM 8.5* 8.9   Lipid Panel:     Component Value Date/Time   CHOL 203 (H) 05/13/2019 0632   TRIG 226 (H) 05/13/2019 0632   HDL 29 (L) 05/13/2019 0632   CHOLHDL 7.0 05/13/2019 0632   VLDL 45 (H) 05/13/2019 0632   LDLCALC 129 (H) 05/13/2019 1610   HgbA1c:  Lab Results  Component Value Date   HGBA1C 6.0 (H) 05/13/2019   Urine Drug Screen: No results found for: LABOPIA, COCAINSCRNUR, LABBENZ, AMPHETMU, THCU, LABBARB  Alcohol Level No results found for: ETH  IMAGING Ct Head Wo Contrast  Result Date: 05/16/2019 CLINICAL DATA:  Altered level of consciousness, follow-up stroke EXAM: CT HEAD WITHOUT CONTRAST TECHNIQUE: Contiguous axial images were obtained from the base of the skull through the vertex without intravenous contrast. COMPARISON:  05/12/2019 FINDINGS: Brain: No evidence of acute infarction, hemorrhage, hydrocephalus, extra-axial  collection or mass lesion/mass effect. Mild periventricular white matter hypodensity and global volume loss. Vascular: No hyperdense vessel or unexpected calcification. Skull: Normal. Negative for fracture or focal lesion. Sinuses/Orbits: No acute finding.  Bilateral scleral banding. Other: Large pannus of the transverse ligament of atlantoaxial articulation with compression of the upper cervical spinal cord, as better demonstrated by prior MRI. IMPRESSION: 1. No acute intracranial pathology. No non-contrast CT evidence of involving infarction or hemorrhage. Mild small-vessel white matter disease and global volume loss in keeping with advanced patient age. 2. Large pannus of the transverse ligament of atlantoaxial articulation with compression of the upper cervical spinal cord, as better demonstrated by prior MRI. Electronically Signed   By: Eddie Candle M.D.   On: 05/16/2019 09:53   Ct Chest Wo Contrast  Result Date: 05/16/2019 CLINICAL DATA:  Evaluate for pneumonia. EXAM: CT CHEST WITHOUT CONTRAST TECHNIQUE: Multidetector CT imaging of the chest was performed following the standard protocol without IV contrast. COMPARISON:  Chest x-ray from same day. FINDINGS: Cardiovascular: Normal heart size. No pericardial effusion. No thoracic aortic aneurysm. Coronary, aortic arch, and branch vessel atherosclerotic vascular disease. Normal caliber pulmonary arteries. Mediastinum/Nodes: No enlarged mediastinal or axillary lymph nodes. Thyroid gland, trachea, and esophagus demonstrate  no significant findings. Unchanged large hiatal hernia. Lungs/Pleura: Subsegmental atelectasis in the left lower lobe adjacent to the hiatal hernia and in the right middle lobe adjacent to the right hemidiaphragm eventration. No focal consolidation, pleural effusion, or pneumothorax. No suspicious pulmonary nodule. Upper Abdomen: No acute abnormality. Musculoskeletal: Bilateral gynecomastia. New mild L1 compression fracture with 3 mm  retropulsion. Mild adjacent paravertebral stranding suggests the fracture is acute to subacute. Bilateral glenohumeral osteoarthritis. Severe atrophy of the bilateral rotator cuff muscles. IMPRESSION: 1. No pneumonia. Subsegmental atelectasis in the left lower lobe adjacent to a large hiatal hernia. 2. Acute to subacute mild L1 compression fracture with 3 mm retropulsion. 3.  Aortic atherosclerosis (ICD10-I70.0). Electronically Signed   By: Titus Dubin M.D.   On: 05/16/2019 20:04   Dg Chest Port 1 View  Result Date: 05/16/2019 CLINICAL DATA:  Lethargy EXAM: PORTABLE CHEST 1 VIEW COMPARISON:  10/30/2018 FINDINGS: Low lung volumes. Streaky atelectasis or scar at the right base. Airspace opacity at the left base. Aortic atherosclerosis. No pneumothorax. IMPRESSION: 1. Low lung volumes. Airspace opacity in the left retrocardiac region, possible atelectasis or pneumonia. Potentially could reflect collapsed hiatal hernia given appearance on 10/30/2018 comparison radiograph. Electronically Signed   By: Donavan Foil M.D.   On: 05/16/2019 15:02    PHYSICAL EXAM Pleasant frail elderly Caucasian male not in distress. . Afebrile. Head is nontraumatic. Neck is supple without bruit.    Cardiac exam no murmur or gallop. Lungs are clear to auscultation. Distal pulses are well felt.  Redness at the right elbow at the previous IV site with tenderness and pain on movement  Neurological Exam ;  Awake.  Moderately dysarthric speech .mild decreased hearing bilaterally.  Eye movements full without nystagmus.fundi were not visualized. Vision acuity and fields appear normal.Palatal movements are normal.  Mild right lower facial weakness.. Tongue midline. Marked right hemiplegia with right upper extreme movements weak but  limited due to severe pain in the right elbow and wrist.  Has 2/5 right lower extremity strength but effort is variable  Normal sensation. Gait deferred.  ASSESSMENT/PLAN 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 Following admission, had neuro worsening with minimal movement of RUE and new R facial weakness, improved with fluid bolus.   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 Small vessel disease. Atrophy. ASPECTS 10.   CT CS severe pannus posterior to the dens causing spinal stenosis and R cord compression  MRI  L paramedian pontine. Advanced small vessel disease. Atrophy.   MRA  Widespread intracranial atherosclerosis w/ mod to severe flow-limiting tandem stenoses, esp in the BA and L MCA M1  CTA head nonocclusive thrombus in L MCA M1 with occlusion of inferior division L M2 extending over 7 mm.  Essentially unchanged from previous MRI/MRA.  Occlusion of R V2 and V3, also unchanged.  Severe stenosis of BA & B PCAs.  Large amount of pannus dorsal to the odontoid process with spinal canal stenosis.  2D Echo normal EF  LDL 129  HgbA1c 6.0.  Lovenox 30 mg sq daily for VTE prophylaxis  aspirin 81 mg daily and clopidogrel 75 mg daily prior to admission but stopped 3 days prior to admission given bruising along R knee/thigh, now on  aspirin 81 mg daily and clopidogrel 75 mg daily. X 3 months and then aspirin alone  Therapy recommendations: CIR  Disposition:  CIR  Follow up with Dr. Delice Lesch  Hypertension  Stable . Permissive hypertension (OK if < 220/120) but gradually normalize in 5-7 days . Long-term BP goal normotensive  Hyperlipidemia  Home meds:  No statin  Now on Lipitor 40  LDL 129, goal < 70  Has refused statins in the past, including during last admission in February  Continue statin at discharge  Dysphagia   Secondary to stroke   Speech therapy on board  Recommend puree diet and thin liquids   other Stroke Risk  Factors  Advanced age  Former Cigarette smoker, quit 33 yrs ago  ETOH use, advised to drink no more than 2 drink(s) a day  Hx stroke/TIA - followed by Dr. Delice Lesch ? 2013 neg MRI and EEG ? 01/2015 slumped to R. EEG normal. MRI punctate cortical infarct posterior L parietal love ? 09/2015 B leg weakness slurred speech. MRI sign IC ant and post circ ? 06/26/2016 loss of consciousness VB TIA ? 05/2018 memory loss, difficulty completing complex tasks c/w mild dementia ? 12/2018 - Incidental left occipital infarct secondary to large vessel disease source. Official dx BPPV as cause of dizziness  Chronic diastolic Congestive heart failure  Other Active Problems  Chronic OS blindness  RA  Myelopathy with acute C-spine abnormality, compression of cervical cord C2 -discussed during previous admission.  Can consider outpatient neurosurgical follow-up however patient is likely not a surgical candidate-age and refusal  Mild AKI on CKD stage III  GERD  Hypothyroidism  Hospital day # 5 Patient has moderately disabling right hemiplegia from his pontine stroke.  His prognosis is guarded but family wants to continue aggressive care.  Continue aspirin and Plavix for 3 months followed by aspirin alone.  Aggressive risk factor modification.  Discussed with Dr. Delphia Grates.  Stroke team will sign off.  Kindly call for questions.  Follow-up as an outpatient stroke clinic in 6 weeks. Antony Contras, MD Medical Director [facility] Pager: [phone] 05/17/2019 1:25 PM   To contact Stroke Continuity provider, please refer to [email or web address] After hours, contact General Neurology

## 2019-05-17 NOTE — Progress Notes (Addendum)
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  He does have significant past medical history for dyslipidemia, chronic kidney disease, diastolic heart failure, rheumatoid arthritis, hypothyroidism, GERD and history of TIA.  He reported about 12 hours of right-sided weakness, associated with paresthesias including his face , along with slurred speech.  Initial physical examination blood pressure 159/98, heart rate 83, respirate 18, temperature 97.7, oxygen saturation 95%.  He had moist mucous membranes, his lungs were clear to auscultation bilaterally, heart S1-S2 present with me, the abdomen was soft.  He had mild right facial droop, strength 4/5 right upper-lower extremity, left side 5/ 5 strength.  Brain MRI showed acute nonhemorrhagic infarct affecting the left paramedian pons.  Advanced atrophy and small vessel disease.  Widespread intracranial atherosclerosis disease, with moderate to severe flow-limiting tandem stenosis, most notable in the basilar artery.  High-grade stenosis of the left M1 MCA.  EKG, 86 bpm, normal axis, normal intervals, sinus rhythm, no ST segment or T wave changes.  Patient was admitted to the hospital with diagnosis of acute left pontine ischemic stroke.   Assessment & Plan:   Principal Problem:   CVA (cerebral vascular accident) ([place]) Active Problems:   Dyslipidemia   GERD   Dysphagia   Chronic diastolic CHF (congestive heart failure) ([facility])   Essential hypertension   Weakness generalized   CKD (chronic kidney disease) stage 3, GFR 30-59 ml/min ([place])   Palliative care by specialist   DNR (do not resuscitate) discussion   1.  Acute ischemic left pontine stroke. Patient continue to have right sided hemiparesis and right facial droop. Intracerebral atherosclerosis with stenosis.  Echocardiogram with preserved LV systolic function. LDL 129. Case discussed with Dr. Leonie Man, patient will continue aspirin and clopidogrel for 3 months and then continue aspirin alone. Continue statin therapy. Will arrange for home health services, patient does not qualify for inpatient rehab.   2.  Chronic diastolic heart failure. Clinically stable with no signs of exacerbation, continue telemetry monitoring.   3.  Chronic kidney disease stage III. Stable renal function, with serum cr at 1,4 to 1,6, will continue close monitoring of renal function and electrolytes.   4.  Hypothyroidism. Continue with levothyroxine.   5. Swallow dysfunction. Related to CVA, will continue aspiration precautions with dysphagia one diet, chest film and chest Ct personally reviewed no pneumonic infiltrate.   DVT prophylaxis: enoxparin    Code Status:  full Family Communication: no family at the bedside  Disposition Plan/ discharge barriers: pending clinical improvement, plan to discharge home with home health on Monday June 29.  Body mass index is 26.69 kg/m. Malnutrition Type:      Malnutrition Characteristics:      Nutrition Interventions:     RN Pressure Injury Documentation:     Consultants:   Neurology   Procedures:     Antimicrobials:       Subjective: Patient is feeling well, continue to have significant weakness on the right side, no nausea or vomiting, no chest pain or dyspnea, willing to go home.   Objective: Vitals:   05/16/19 1738 05/16/19 1938 05/16/19 2319 05/17/19 0748  BP: 134/87 (!) 122/92 124/64 126/76  Pulse: 99 99 (!) 105 87  Resp: 17 17 17 17   Temp: (!) 97.3 F (36.3 C) 98 F (36.7 C) 99.5 F (37.5  C) 98.9 F (37.2 C)  TempSrc: Axillary Oral Oral Axillary  SpO2: 97% 100% 93% 92%  Weight:      Height:        Intake/Output Summary (Last 24 hours) at 05/17/2019 1209 Last data filed at 05/17/2019 0746 Gross per 24 hour  Intake 831.62 ml  Output 920 ml   Net -88.38 ml   Filed Weights   05/12/19 1931 05/16/19 0435  Weight: 66.9 kg 66.2 kg    Examination:   General: deconditioned  Neurology: Awake and alert, right facial droop, positive right upper and lower extremities, 3/5.  E ENT: mild  pallor, no icterus, oral mucosa moist Cardiovascular: No JVD. S1-S2 present, rhythmic, no gallops, rubs, or murmurs. Trace pitting bilateral lower extremity edema. Pulmonary: positive breath sounds bilaterally, adequate air movement, no wheezing, rhonchi or rales. Gastrointestinal. Abdomen with, no organomegaly, non tender, no rebound or guarding Skin. No rashes Musculoskeletal: no joint deformities     Data Reviewed: I have personally reviewed following labs and imaging studies  CBC: Recent Labs  Lab 05/12/19 1032 05/12/19 1100 05/14/19 0752 05/16/19 1336  WBC 7.7  --  6.2 8.9  NEUTROABS 4.7  --   --  6.3  HGB 14.1 14.6 12.9* 13.2  HCT 44.0 43.0 39.7 41.1  MCV 98.7  --  97.8 97.6  PLT 232  --  192 494   Basic Metabolic Panel: Recent Labs  Lab 05/12/19 1032 05/12/19 1100 05/14/19 0752 05/16/19 1336  NA 138 139 145 143  K 4.3 4.3 3.8 4.1  CL 104 105 116* 110  CO2 24  --  22 23  GLUCOSE 132* 124* 96 124*  BUN 19 20 14 15   CREATININE 1.57* 1.60* 1.40* 1.61*  CALCIUM 9.6  --  8.5* 8.9   GFR: Estimated Creatinine Clearance: 21.4 mL/min (A) (by C-G formula based on SCr of 1.61 mg/dL (H)). Liver Function Tests: Recent Labs  Lab 05/12/19 1032 05/16/19 1336  AST 15 16  ALT 11 12  ALKPHOS 79 61  BILITOT 0.7 1.3*  PROT 6.1* 5.4*  ALBUMIN 3.6 2.9*   No results for input(s): LIPASE, AMYLASE in the last 168 hours. No results for input(s): AMMONIA in the last 168 hours. Coagulation Profile: Recent Labs  Lab 05/12/19 1032  INR 1.1   Cardiac Enzymes: No results for input(s): CKTOTAL, CKMB, CKMBINDEX, TROPONINI in the last 168 hours. BNP (last 3 results) No results for input(s): PROBNP in the last 8760 hours. HbA1C: No  results for input(s): HGBA1C in the last 72 hours. CBG: Recent Labs  Lab 05/12/19 1049 05/13/19 0145 05/13/19 1718 05/16/19 1105  GLUCAP 114* 97 90 117*   Lipid Profile: No results for input(s): CHOL, HDL, LDLCALC, TRIG, CHOLHDL, LDLDIRECT in the last 72 hours. Thyroid Function Tests: No results for input(s): TSH, T4TOTAL, FREET4, T3FREE, THYROIDAB in the last 72 hours. Anemia Panel: No results for input(s): VITAMINB12, FOLATE, FERRITIN, TIBC, IRON, RETICCTPCT in the last 72 hours.    Radiology Studies: I have reviewed all of the imaging during this hospital visit personally     Scheduled Meds: .  stroke: mapping our early stages of recovery book   Does not apply Once  . aspirin EC  81 mg Oral Daily  . atorvastatin  40 mg Oral q1800  . clopidogrel  75 mg Oral Daily  . enoxaparin (LOVENOX) injection  30 mg Subcutaneous Q24H  . latanoprost  1 drop Both Eyes QHS  . levothyroxine  50 mcg Oral QAC breakfast  .  multivitamin with minerals  1 tablet Oral Daily  . pantoprazole  40 mg Oral Daily  . sodium chloride flush  3 mL Intravenous Once   Continuous Infusions: . dextrose 5 % and 0.9% NaCl 50 mL/hr at 05/17/19 0746     LOS: 5 days        Azaliyah Kennard Gerome Apley, MD

## 2019-05-17 NOTE — Progress Notes (Signed)
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 He presented 6/21 with complaints of right-sided weakness and slurred speech beginning the night before.  MRI revealed acute nonhemorrhagic infarct of L paramedian pons. Pt with acute decompensation early morning 6/22 with increased R sided weakness. Follow-up MRI ordered.     PT Comments    Patient seen for mobility progression. Pt agreeable to participate in therapy. Pt requires max A +2 for bed mobility and squat pivot transfer this session. Continue to progress as tolerated.    Follow Up Recommendations  SNF;Supervision/Assistance - 24 hour     Equipment Recommendations  Other (comment)(TBD by next venue of care)    Recommendations for Other Services       Precautions / Restrictions Precautions Precautions: None Restrictions Weight Bearing Restrictions: No    Mobility  Bed Mobility Overal bed mobility: Needs Assistance Bed Mobility: Supine to Sit     Supine to sit: Max assist;+2 for physical assistance     General bed mobility comments: mutlimodal cues for sequencing; assist to bring R LE and hips to EOB with use of bed pad and to elevate trunk into sitting; pt able to reach with L UE toward R side   Transfers Overall transfer level: Needs assistance Equipment used: (face to face with gait belt and bed pad) Transfers: Squat Pivot Transfers     Squat pivot transfers: +2 physical assistance;Max assist     General transfer comment: use of Stedy deferred given pt's painful bilat LE and R UE; pt requires +2 assist for squat pivot using bed pad for hip extension with cues for anterior translation of trunk and  sequencing   Ambulation/Gait                 Stairs             Wheelchair Mobility    Modified Rankin (Stroke Patients Only) Modified Rankin (Stroke Patients Only) Pre-Morbid Rankin Score: Moderate disability Modified Rankin: Severe disability     Balance Overall balance assessment: Needs assistance Sitting-balance support: Feet supported;Single extremity supported Sitting balance-Leahy Scale: Poor   Postural control: Posterior lean;Left lateral lean Standing balance support: Bilateral upper extremity supported;During functional activity Standing balance-Leahy Scale: Zero                              Cognition Arousal/Alertness: Awake/alert Behavior During Therapy: WFL for tasks assessed/performed Overall Cognitive Status: No family/caregiver present to determine baseline cognitive functioning                                 General Comments: some difficulty with communication but following single step cues       Exercises      General Comments        Pertinent Vitals/Pain Pain Assessment: Faces Faces Pain Scale: Hurts little more Pain Location: knees (L>R) and R hand; "all over" Pain Descriptors / Indicators: Grimacing;Guarding;Moaning;Sore Pain Intervention(s): Limited activity within patient's tolerance;Monitored during session;Repositioned    Home Living  Prior Function            PT Goals (current goals can now be found in the care plan section) Acute Rehab PT Goals Patient Stated Goal: to use Rt hand Progress towards PT goals: Progressing toward goals    Frequency    Min 3X/week      PT Plan Current plan remains appropriate    Co-evaluation              AM-PAC PT "6 Clicks" Mobility   Outcome Measure  Help needed turning from your back to your side while in a flat bed without using bedrails?: A Lot Help needed moving from lying on your back to sitting on the  side of a flat bed without using bedrails?: A Lot Help needed moving to and from a bed to a chair (including a wheelchair)?: Total Help needed standing up from a chair using your arms (e.g., wheelchair or bedside chair)?: A Lot Help needed to walk in hospital room?: Total Help needed climbing 3-5 steps with a railing? : Total 6 Click Score: 9    End of Session Equipment Utilized During Treatment: Gait belt Activity Tolerance: Patient limited by pain;Patient limited by fatigue Patient left: in chair;with call bell/phone within reach;with chair alarm set Nurse Communication: Mobility status PT Visit Diagnosis: Unsteadiness on feet (R26.81);Muscle weakness (generalized) (M62.81);Other symptoms and signs involving the nervous system (R29.898);Hemiplegia and hemiparesis Hemiplegia - Right/Left: Right Hemiplegia - dominant/non-dominant: Dominant Hemiplegia - caused by: Cerebral infarction     Time: 6948-5462 PT Time Calculation (min) (ACUTE ONLY): 24 min  Charges:  $Therapeutic Activity: 23-37 mins                     Earney Navy, PTA Acute Rehabilitation Services Pager: [phone] Office: [phone]     Darliss Cheney 05/17/2019, 3:49 PM

## 2019-05-17 NOTE — TOC Initial Note (Addendum)
Transition of Care [facility]) - Initial/Assessment Note    Patient Details  Name: Robert Rowe MRN: [id number] Date of Birth: [birth year]  Transition of Care [facility]) CM/SW Contact:    Pollie Friar, RN Phone Number: 05/17/2019, 1:02 PM  Clinical Narrative:                 Son requesting condom caths for home. CM notified MD and he was in agreement. Orders sent to Zack with [employer] and he states they will be mailed to the home. Son selected [facility] and Tommi Rumps accepted.  Son concerned about congestion his dad had yesterday when he visited. MD aware.  Family arranging 24 hour caregivers in the home.  Pt most likely will need PTAR home. TOC following for further d/c needs.   1400: Son has decided he wants a hospital bed for home. CM notified MD. Barnetta Chapel with [facility] [phone] notified and will have DME delivered to the home.  Expected Discharge Plan: [place] Barriers to Discharge: Continued Medical Work up   Patient Goals and CMS Choice   CMS Medicare.gov Compare Post Acute Care list provided to:: Patient Represenative (must comment) Choice offered to / list presented to : Adult Children(son)  Expected Discharge Plan and Services Expected Discharge Plan: [place]   Discharge Planning Services: CM Consult   Living arrangements for the past 2 months: Single Family Home                 DME Arranged: Other see comment(condom cath) DME Agency: [employer] Date DME Agency Contacted: 05/17/19 Time DME Agency Contacted: 3 Representative spoke with at DME Agency: [place]: PT, OT, Speech Therapy, Social Work, Nurse's Aide [place] Agency: [place] Date [place]: 05/17/19 Time [place]: 1206 Representative spoke with at [place]: [place] Arrangements/Services Living arrangements for the past 2 months: [place] Lives with:: Spouse   Do you feel safe going back to  the place where you live?: Yes      Need for Family Participation in Patient Care: Yes (Comment)(24 hour supervision) Care giver support system in place?: Yes (comment)(wife arranging 24 hour caregivers) Current home services: DME(shower seat, stair lift, wheelchair, adjustable bed) Criminal Activity/Legal Involvement Pertinent to Current Situation/Hospitalization: No - Comment as needed  Activities of Daily Living Home Assistive Devices/Equipment: Other (Comment), Walker (specify type)(chair lift) ADL Screening (condition at time of admission) Patient's cognitive ability adequate to safely complete daily activities?: Yes Is the patient deaf or have difficulty hearing?: No Does the patient have difficulty seeing, even when wearing glasses/contacts?: Yes Does the patient have difficulty concentrating, remembering, or making decisions?: No Patient able to express need for assistance with ADLs?: Yes Does the patient have difficulty dressing or bathing?: No Independently performs ADLs?: No Communication: Independent Dressing (OT): Needs assistance Is this a change from baseline?: Pre-admission baseline Grooming: Independent Feeding: Independent Bathing: Needs assistance Is this a change from baseline?: Pre-admission baseline Toileting: Needs assistance Is this a change from baseline?: Pre-admission baseline In/Out Bed: Independent with device (comment) Walks in Home: Independent with device (comment) Does the patient have difficulty walking or climbing stairs?: No Weakness of Legs: Both Weakness of Arms/Hands: Right  Permission Sought/Granted                  Emotional Assessment Appearance:: Appears stated age         Psych Involvement: No (comment)  Admission diagnosis:  TIA (transient ischemic attack) [G45.9] CVA (cerebral vascular accident) [facility]) [I63.9] Patient Active Problem List   Diagnosis Date Noted  . Palliative care by specialist   . DNR (do not resuscitate)  discussion   . Acute cholecystitis 01/14/2019  . Cholecystitis   . 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 Gait disturbance, post-stroke   . BPV (benign positional vertigo) 01/02/2019  . History of stroke 01/01/2019  . TIA (transient ischemic attack) 12/31/2018  . CVA (cerebral vascular accident) ([place]) 12/31/2018  . Chest pain 05/15/2018  . CKD (chronic kidney disease) stage 3, GFR 30-59 ml/min (HCC) 05/08/2018  . Weakness generalized 06/13/2017  . Abnormal diffusion capacity determined by pulmonary function test 06/13/2017  . Chronic diastolic CHF (congestive heart failure) ([place]) 11/01/2016  . Essential hypertension 11/01/2016  . Intracranial vascular stenosis 11/09/2015  . Mild cognitive impairment 11/09/2015  . 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 Benign prostatic hyperplasia with urinary obstruction 03/19/2015  . Awareness alteration, transient 02/26/2015  . PVC's (premature ventricular contractions) 08/22/2013  . Hypothyroidism 05/09/2013  . Stricture and stenosis of esophagus 03/12/2013  . Dysphagia 02/22/2013  . DOE (dyspnea on exertion) 03/23/2011  . PALPITATIONS 02/12/2010  . CHEST DISCOMFORT 04/25/2009  . PEDAL EDEMA 05/01/2008  . GERD 12/03/2007  . Dyslipidemia 04/05/2007  . BENIGN PROSTATIC HYPERTROPHY 04/05/2007  . Rheumatoid arthritis ([place]) 04/05/2007  . Osteoarthritis 04/05/2007  . History of cardiovascular disorder 04/05/2007   PCP:  [name], MD Pharmacy:   [place], [place] - [address] [place] [address] Phone: [phone] Fax: [phone]  [place], [place] [address] [place] [address] Phone: [phone] Fax: [phone]  [id number] [place], [place] [address] [place] [address] Phone: [phone] Fax:  [phone]     Social Determinants of Health (SDOH) Interventions    Readmission Risk Interventions No flowsheet data found.

## 2019-05-18 LAB — BASIC METABOLIC PANEL
Anion gap: 8 (ref 5–15)
BUN: 14 mg/dL (ref 8–23)
CO2: 23 mmol/L (ref 22–32)
Calcium: 8.7 mg/dL — ABNORMAL LOW (ref 8.9–10.3)
Chloride: 112 mmol/L — ABNORMAL HIGH (ref 98–111)
Creatinine, Ser: 1.39 mg/dL — ABNORMAL HIGH (ref 0.61–1.24)
GFR calc Af Amer: 49 mL/min — ABNORMAL LOW (ref >60)
GFR calc non Af Amer: 42 mL/min — ABNORMAL LOW (ref >60)
Glucose, Bld: 148 mg/dL — ABNORMAL HIGH (ref 70–99)
Potassium: 3.6 mmol/L (ref 3.5–5.1)
Sodium: 143 mmol/L (ref 135–145)

## 2019-05-18 NOTE — Progress Notes (Signed)
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  He does have significant past medical history for dyslipidemia, chronic kidney disease, diastolic heart failure, rheumatoid arthritis, hypothyroidism, GERD and history of TIA.  He reported about 12 hours of right-sided weakness, associated with paresthesias including his face , along with slurred speech.  Initial physical examination blood pressure 159/98, heart rate 83, respirate 18, temperature 97.7, oxygen saturation 95%.  He had moist mucous membranes, his lungs were clear to auscultation bilaterally, heart S1-S2 present with me, the abdomen was soft.  He had mild right facial droop, strength 4/5 right upper-lower extremity, left side 5/ 5 strength.  Brain MRI showed acute nonhemorrhagic infarct affecting the left paramedian pons.  Advanced atrophy and small vessel disease.  Widespread intracranial atherosclerosis disease, with moderate to severe flow-limiting tandem stenosis, most notable in the basilar artery.  High-grade stenosis of the left M1 MCA.  EKG, 86 bpm, normal axis, normal intervals, sinus rhythm, no ST segment or T wave changes.  Patient was admitted to the hospital with diagnosis of acute left pontine ischemic stroke.   Assessment & Plan:   Principal Problem:   CVA (cerebral vascular accident) ([place]) Active Problems:   Dyslipidemia   GERD   Dysphagia   Chronic diastolic CHF (congestive heart failure) (HCC)   Essential hypertension   Weakness generalized   CKD (chronic kidney disease) stage 3, GFR 30-59 ml/min ([place])   Palliative care by specialist   DNR (do not resuscitate) discussion  1.  Acute ischemic left pontine stroke. Patient continue to have right sided hemiparesis and right facial droop. Intracerebral atherosclerosis with stenosis.  Echocardiogram with preserved LV systolic function. LDL 129. Case discussed with Dr. Leonie Man, patient will continue aspirin and clopidogrel for 3 months and then continue aspirin alone. Continue statin therapy. Will arrange for home health services, patient does not qualify for inpatient rehab.   2.  Chronic diastolic heart failure. Clinically stable with no signs of exacerbation, continue telemetry monitoring.   3.  Chronic kidney disease stage III. Stable renal function, with serum cr at 1,4 to 1,6, will continue close monitoring of renal function and electrolytes.   4.  Hypothyroidism. Continue with levothyroxine.   5. Swallow dysfunction. Related to CVA, will continue aspiration precautions with dysphagia one diet, chest film and chest Ct personally reviewed no pneumonic infiltrate.   DVT prophylaxis: enoxparin    Code Status:  full Family Communication: no family at the bedside  Disposition Plan/ discharge barriers: pending clinical improvement, plan to discharge home with home health on Monday June 29.   Estimated body mass index is 26.69 kg/m as calculated from the following:   Height as of this encounter: 5\' 2"  (1.575 m).   Weight as of this encounter: 66.2 kg.   Subjective:  Resting in bed in no acute distress Objective: Vitals:   05/17/19 2100 05/18/19 0007 05/18/19 0403 05/18/19 0828  BP: 134/86 131/86 121/63 115/68  Pulse:  99 98 98  Resp:  18 16 16   Temp:  98.5 F (36.9 C) 98 F (36.7 C) 99.7 F (37.6 C)  TempSrc:  Oral Oral Axillary  SpO2: 94% 96% 93% 92%  Weight:      Height:        Intake/Output Summary (Last 24 hours) at 05/18/2019 0946 Last data filed at 05/18/2019 0933 Gross per  24 hour  Intake 180 ml  Output -  Net 180 ml   Filed Weights   05/12/19 1931 05/16/19 0435  Weight: 66.9 kg 66.2 kg    Examination:  General exam: Appears calm and comfortable  Respiratory system: Clear to auscultation. Respiratory effort normal. Cardiovascular  system: S1 & S2 heard, RRR. No JVD, murmurs, rubs, gallops or clicks. No pedal edema. Gastrointestinal system: Abdomen is nondistended, soft and nontender. No organomegaly or masses felt. Normal bowel sounds heard. Central nervous system: Awake right-sided weakness upper and lower extremities  extremities: Symmetric 5 x 5 power. Skin: No rashes, lesions or ulcers Psychiatry: Judgement and insight appear normal. Mood & affect appropriate.     Data Reviewed: I have personally reviewed following labs and imaging studies  CBC: Recent Labs  Lab 05/12/19 1032 05/12/19 1100 05/14/19 0752 05/16/19 1336  WBC 7.7  --  6.2 8.9  NEUTROABS 4.7  --   --  6.3  HGB 14.1 14.6 12.9* 13.2  HCT 44.0 43.0 39.7 41.1  MCV 98.7  --  97.8 97.6  PLT 232  --  192 256   Basic Metabolic Panel: Recent Labs  Lab 05/12/19 1032 05/12/19 1100 05/14/19 0752 05/16/19 1336 05/18/19 0421  NA 138 139 145 143 143  K 4.3 4.3 3.8 4.1 3.6  CL 104 105 116* 110 112*  CO2 24  --  22 23 23   GLUCOSE 132* 124* 96 124* 148*  BUN 19 20 14 15 14   CREATININE 1.57* 1.60* 1.40* 1.61* 1.39*  CALCIUM 9.6  --  8.5* 8.9 8.7*   GFR: Estimated Creatinine Clearance: 24.8 mL/min (A) (by C-G formula based on SCr of 1.39 mg/dL (H)). Liver Function Tests: Recent Labs  Lab 05/12/19 1032 05/16/19 1336  AST 15 16  ALT 11 12  ALKPHOS 79 61  BILITOT 0.7 1.3*  PROT 6.1* 5.4*  ALBUMIN 3.6 2.9*   No results for input(s): LIPASE, AMYLASE in the last 168 hours. No results for input(s): AMMONIA in the last 168 hours. Coagulation Profile: Recent Labs  Lab 05/12/19 1032  INR 1.1   Cardiac Enzymes: No results for input(s): CKTOTAL, CKMB, CKMBINDEX, TROPONINI in the last 168 hours. BNP (last 3 results) No results for input(s): PROBNP in the last 8760 hours. HbA1C: No results for input(s): HGBA1C in the last 72 hours. CBG: Recent Labs  Lab 05/12/19 1049 05/13/19 0145 05/13/19 1718 05/16/19 1105  GLUCAP 114* 97 90 117*    Lipid Profile: No results for input(s): CHOL, HDL, LDLCALC, TRIG, CHOLHDL, LDLDIRECT in the last 72 hours. Thyroid Function Tests: No results for input(s): TSH, T4TOTAL, FREET4, T3FREE, THYROIDAB in the last 72 hours. Anemia Panel: No results for input(s): VITAMINB12, FOLATE, FERRITIN, TIBC, IRON, RETICCTPCT in the last 72 hours. Sepsis Labs: No results for input(s): PROCALCITON, LATICACIDVEN in the last 168 hours.  Recent Results (from the past 240 hour(s))  SARS Coronavirus 2 (CEPHEID - Performed in [place] hospital lab), Hosp Order     Status: None   Collection Time: 05/12/19  3:36 PM   Specimen: Nasopharyngeal Swab  Result Value Ref Range Status   SARS Coronavirus 2 NEGATIVE NEGATIVE Final    Comment: (NOTE) If result is NEGATIVE SARS-CoV-2 target nucleic acids are NOT DETECTED. The SARS-CoV-2 RNA is generally detectable in upper and lower  respiratory specimens during the acute phase of infection. The lowest  concentration of SARS-CoV-2 viral copies this assay can detect is 250  copies / mL. A negative result does not  preclude SARS-CoV-2 infection  and should not be used as the sole basis for treatment or other  patient management decisions.  A negative result may occur with  improper specimen collection / handling, submission of specimen other  than nasopharyngeal swab, presence of viral mutation(s) within the  areas targeted by this assay, and inadequate number of viral copies  (<250 copies / mL). A negative result must be combined with clinical  observations, patient history, and epidemiological information. If result is POSITIVE SARS-CoV-2 target nucleic acids are DETECTED. The SARS-CoV-2 RNA is generally detectable in upper and lower  respiratory specimens dur ing the acute phase of infection.  Positive  results are indicative of active infection with SARS-CoV-2.  Clinical  correlation with patient history and other diagnostic information is  necessary to determine  patient infection status.  Positive results do  not rule out bacterial infection or co-infection with other viruses. If result is PRESUMPTIVE POSTIVE SARS-CoV-2 nucleic acids MAY BE PRESENT.   A presumptive positive result was obtained on the submitted specimen  and confirmed on repeat testing.  While 2019 novel coronavirus  (SARS-CoV-2) nucleic acids may be present in the submitted sample  additional confirmatory testing may be necessary for epidemiological  and / or clinical management purposes  to differentiate between  SARS-CoV-2 and other Sarbecovirus currently known to infect humans.  If clinically indicated additional testing with an alternate test  methodology [phone]) is advised. The SARS-CoV-2 RNA is generally  detectable in upper and lower respiratory sp ecimens during the acute  phase of infection. The expected result is Negative. Fact Sheet for Patients:  StrictlyIdeas.no Fact Sheet for Healthcare Providers: BankingDealers.co.za This test is not yet approved or cleared by the [place] FDA and has been authorized for detection and/or diagnosis of SARS-CoV-2 by FDA under an Emergency Use Authorization (EUA).  This EUA will remain in effect (meaning this test can be used) for the duration of the COVID-19 declaration under Section 564(b)(1) of the Act, 21 U.S.C. section 360bbb-3(b)(1), unless the authorization is terminated or revoked sooner. Performed at [facility], [place] [address] [place], [place] [address]          Radiology Studies: Ct Chest Wo Contrast  Result Date: 05/16/2019 CLINICAL DATA:  Evaluate for pneumonia. EXAM: CT CHEST WITHOUT CONTRAST TECHNIQUE: Multidetector CT imaging of the chest was performed following the standard protocol without IV contrast. COMPARISON:  Chest x-ray from same day. FINDINGS: Cardiovascular: Normal heart size. No pericardial effusion. No thoracic aortic aneurysm.  Coronary, aortic arch, and branch vessel atherosclerotic vascular disease. Normal caliber pulmonary arteries. Mediastinum/Nodes: No enlarged mediastinal or axillary lymph nodes. Thyroid gland, trachea, and esophagus demonstrate no significant findings. Unchanged large hiatal hernia. Lungs/Pleura: Subsegmental atelectasis in the left lower lobe adjacent to the hiatal hernia and in the right middle lobe adjacent to the right hemidiaphragm eventration. No focal consolidation, pleural effusion, or pneumothorax. No suspicious pulmonary nodule. Upper Abdomen: No acute abnormality. Musculoskeletal: Bilateral gynecomastia. New mild L1 compression fracture with 3 mm retropulsion. Mild adjacent paravertebral stranding suggests the fracture is acute to subacute. Bilateral glenohumeral osteoarthritis. Severe atrophy of the bilateral rotator cuff muscles. IMPRESSION: 1. No pneumonia. Subsegmental atelectasis in the left lower lobe adjacent to a large hiatal hernia. 2. Acute to subacute mild L1 compression fracture with 3 mm retropulsion. 3.  Aortic atherosclerosis (ICD10-I70.0). Electronically Signed   By: Titus Dubin M.D.   On: 05/16/2019 20:04   Dg Chest Port 1 View  Result Date: 05/16/2019  CLINICAL DATA:  Lethargy EXAM: PORTABLE CHEST 1 VIEW COMPARISON:  10/30/2018 FINDINGS: Low lung volumes. Streaky atelectasis or scar at the right base. Airspace opacity at the left base. Aortic atherosclerosis. No pneumothorax. IMPRESSION: 1. Low lung volumes. Airspace opacity in the left retrocardiac region, possible atelectasis or pneumonia. Potentially could reflect collapsed hiatal hernia given appearance on 10/30/2018 comparison radiograph. Electronically Signed   By: Donavan Foil M.D.   On: 05/16/2019 15:02        Scheduled Meds: .  stroke: mapping our early stages of recovery book   Does not apply Once  . aspirin EC  81 mg Oral Daily  . atorvastatin  40 mg Oral q1800  . clopidogrel  75 mg Oral Daily  . enoxaparin  (LOVENOX) injection  30 mg Subcutaneous Q24H  . latanoprost  1 drop Both Eyes QHS  . levothyroxine  50 mcg Oral QAC breakfast  . multivitamin with minerals  1 tablet Oral Daily  . pantoprazole  40 mg Oral Daily  . sodium chloride flush  3 mL Intravenous Once   Continuous Infusions:   LOS: 6 days     Georgette Shell, MD [facility]  If 7PM-7AM, please contact night-coverage [email or web address] Password [facility] 05/18/2019, 9:46 AM

## 2019-05-19 LAB — CREATININE, SERUM
Creatinine, Ser: 1.56 mg/dL — ABNORMAL HIGH (ref 0.61–1.24)
GFR calc Af Amer: 42 mL/min — ABNORMAL LOW (ref >60)
GFR calc non Af Amer: 36 mL/min — ABNORMAL LOW (ref >60)

## 2019-05-19 MED ORDER — SODIUM CHLORIDE 0.9 % IV SOLN
INTRAVENOUS | Status: DC
  Administered 2019-05-19 – 2019-05-20 (×2): via INTRAVENOUS

## 2019-05-19 NOTE — Progress Notes (Signed)
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 He reported about 12 hours of right-sided weakness, associated with paresthesias including his face , along withslurred speech.Initial physical examination blood pressure 159/98, heart rate 83, respirate 18, temperature 97.7, oxygen saturation 95%. He had moist mucous membranes, his lungs were clear to auscultation bilaterally, heart S1-S2 present with me, the abdomen was soft. He had mild right facial droop, strength 4/5 right upper-lower extremity,left side 5/5 strength.Brain MRI showed acute nonhemorrhagic infarct affecting the left paramedian pons. Advanced atrophy and small vessel disease. Widespread intracranial atherosclerosis disease,with moderate to severe flow-limiting tandem stenosis, most notable in the basilar artery. High-grade stenosis of the left M1MCA.EKG, 86 bpm, normal axis, normal intervals, sinus rhythm, no ST segment or T wave changes.  Patient was admitted to the hospital with diagnosis of acute left pontine ischemic stroke.   Assessment & Plan:   Principal Problem:   CVA (cerebral vascular accident) ([place]) Active Problems:   Dyslipidemia   GERD   Dysphagia   Chronic diastolic CHF (congestive heart failure) (HCC)   Essential hypertension   Weakness generalized   CKD (chronic kidney disease) stage 3, GFR 30-59 ml/min ([place])   Palliative care by specialist   DNR (do not resuscitate) discussion   1.Acute ischemic left pontine stroke. Patient continue to have right sided hemiparesis and right facial droop.MRA - Intracerebral atherosclerosis with stenosis.  Echocardiogram with preserved LV systolic function. LDL 129. patient will continue aspirin and clopidogrel for 3 months and then continue aspirin alone. Continue statin therapy. Will arrange for home health services, patient does not qualify for inpatient rehab.dw son [name] will plan on discharge 05/20/2019 home with home health physical therapy.  2.Chronic diastolic heart failure. Clinically stable with no signs of exacerbation, continue telemetry monitoring.  3.Chronic kidney disease stage III.  Creatinine bumped up we will give back of normal saline slowly.    4.Hypothyroidism.Continue with levothyroxine.   5. Swallow dysfunction. Related to CVA, will continue aspiration precautions with dysphagia one diet, chest film and chest Ct personally reviewed no pneumonic infiltrate  DVT prophylaxis:enoxparin Code Status:full Family Communication:no family at the bedside Disposition Plan/ discharge barriers:pending clinical improvement, plan to discharge home with home health on Monday June 29.  Estimated body mass index is 26.69 kg/m as calculated from the following:   Height as of this encounter: 5\' 2"  (1.575 m).   Weight as of this encounter: 66.2 kg.    Subjective: Awake resting in bed some apraxia  Objective: Vitals:   05/18/19 2001 05/19/19 0011 05/19/19 0431 05/19/19 0742  BP: (!) 114/52 136/75 (!) 139/97 (!) 158/94  Pulse: 99 (!) 104 (!) 101 (!) 105  Resp: 18 (!) 22 18 18   Temp: 98.2 F (36.8 C) 99.7 F (37.6 C) 98.4 F (36.9 C) 99 F (37.2 C)  TempSrc: Oral Oral Oral Oral  SpO2: 95% 90% 92% 92%  Weight:      Height:        Intake/Output Summary (Last 24 hours) at 05/19/2019 1022 Last data filed at 05/19/2019 0927 Gross per 24 hour  Intake 173 ml  Output 350 ml  Net -177 ml   Filed Weights   05/12/19 1931 05/16/19 0435  Weight: 66.9 kg 66.2  kg    Examination:  General exam: Appears calm and comfortable  Respiratory system: Clear to  auscultation. Respiratory effort normal. Cardiovascular system: S1 & S2 heard, RRR. No JVD, murmurs, rubs, gallops or clicks. No pedal edema. Gastrointestinal system: Abdomen is nondistended, soft and nontender. No organomegaly or masses felt. Normal bowel sounds heard. Central nervous system awake oriented to hospital right facial droop right upper extremity weakness noted Extremities: Right upper extremity weakness noted  skin: No rashes, lesions or ulcers     Data Reviewed: I have personally reviewed following labs and imaging studies  CBC: Recent Labs  Lab 05/12/19 1032 05/12/19 1100 05/14/19 0752 05/16/19 1336  WBC 7.7  --  6.2 8.9  NEUTROABS 4.7  --   --  6.3  HGB 14.1 14.6 12.9* 13.2  HCT 44.0 43.0 39.7 41.1  MCV 98.7  --  97.8 97.6  PLT 232  --  192 633   Basic Metabolic Panel: Recent Labs  Lab 05/12/19 1032 05/12/19 1100 05/14/19 0752 05/16/19 1336 05/18/19 0421 05/19/19 0613  NA 138 139 145 143 143  --   K 4.3 4.3 3.8 4.1 3.6  --   CL 104 105 116* 110 112*  --   CO2 24  --  22 23 23   --   GLUCOSE 132* 124* 96 124* 148*  --   BUN 19 20 14 15 14   --   CREATININE 1.57* 1.60* 1.40* 1.61* 1.39* 1.56*  CALCIUM 9.6  --  8.5* 8.9 8.7*  --    GFR: Estimated Creatinine Clearance: 22.1 mL/min (A) (by C-G formula based on SCr of 1.56 mg/dL (H)). Liver Function Tests: Recent Labs  Lab 05/12/19 1032 05/16/19 1336  AST 15 16  ALT 11 12  ALKPHOS 79 61  BILITOT 0.7 1.3*  PROT 6.1* 5.4*  ALBUMIN 3.6 2.9*   No results for input(s): LIPASE, AMYLASE in the last 168 hours. No results for input(s): AMMONIA in the last 168 hours. Coagulation Profile: Recent Labs  Lab 05/12/19 1032  INR 1.1   Cardiac Enzymes: No results for input(s): CKTOTAL, CKMB, CKMBINDEX, TROPONINI in the last 168 hours. BNP (last 3 results) No results for input(s): PROBNP in the last 8760 hours. HbA1C: No results for input(s): HGBA1C in the last 72 hours. CBG: Recent Labs  Lab 05/12/19  1049 05/13/19 0145 05/13/19 1718 05/16/19 1105  GLUCAP 114* 97 90 117*   Lipid Profile: No results for input(s): CHOL, HDL, LDLCALC, TRIG, CHOLHDL, LDLDIRECT in the last 72 hours. Thyroid Function Tests: No results for input(s): TSH, T4TOTAL, FREET4, T3FREE, THYROIDAB in the last 72 hours. Anemia Panel: No results for input(s): VITAMINB12, FOLATE, FERRITIN, TIBC, IRON, RETICCTPCT in the last 72 hours. Sepsis Labs: No results for input(s): PROCALCITON, LATICACIDVEN in the last 168 hours.  Recent Results (from the past 240 hour(s))  SARS Coronavirus 2 (CEPHEID - Performed in [place] hospital lab), Hosp Order     Status: None   Collection Time: 05/12/19  3:36 PM   Specimen: Nasopharyngeal Swab  Result Value Ref Range Status   SARS Coronavirus 2 NEGATIVE NEGATIVE Final    Comment: (NOTE) If result is NEGATIVE SARS-CoV-2 target nucleic acids are NOT DETECTED. The SARS-CoV-2 RNA is generally detectable in upper and lower  respiratory specimens during the acute phase of infection. The lowest  concentration of SARS-CoV-2 viral copies this assay can detect is 250  copies / mL. A negative result does not preclude SARS-CoV-2 infection  and should not be used  as the sole basis for treatment or other  patient management decisions.  A negative result may occur with  improper specimen collection / handling, submission of specimen other  than nasopharyngeal swab, presence of viral mutation(s) within the  areas targeted by this assay, and inadequate number of viral copies  (<250 copies / mL). A negative result must be combined with clinical  observations, patient history, and epidemiological information. If result is POSITIVE SARS-CoV-2 target nucleic acids are DETECTED. The SARS-CoV-2 RNA is generally detectable in upper and lower  respiratory specimens dur ing the acute phase of infection.  Positive  results are indicative of active infection with SARS-CoV-2.  Clinical  correlation  with patient history and other diagnostic information is  necessary to determine patient infection status.  Positive results do  not rule out bacterial infection or co-infection with other viruses. If result is PRESUMPTIVE POSTIVE SARS-CoV-2 nucleic acids MAY BE PRESENT.   A presumptive positive result was obtained on the submitted specimen  and confirmed on repeat testing.  While 2019 novel coronavirus  (SARS-CoV-2) nucleic acids may be present in the submitted sample  additional confirmatory testing may be necessary for epidemiological  and / or clinical management purposes  to differentiate between  SARS-CoV-2 and other Sarbecovirus currently known to infect humans.  If clinically indicated additional testing with an alternate test  methodology [phone]) is advised. The SARS-CoV-2 RNA is generally  detectable in upper and lower respiratory sp ecimens during the acute  phase of infection. The expected result is Negative. Fact Sheet for Patients:  StrictlyIdeas.no Fact Sheet for Healthcare Providers: BankingDealers.co.za This test is not yet approved or cleared by the [place] FDA and has been authorized for detection and/or diagnosis of SARS-CoV-2 by FDA under an Emergency Use Authorization (EUA).  This EUA will remain in effect (meaning this test can be used) for the duration of the COVID-19 declaration under Section 564(b)(1) of the Act, 21 U.S.C. section 360bbb-3(b)(1), unless the authorization is terminated or revoked sooner. Performed at [facility], [place] [address] [place], [place] [address]          Radiology Studies: No results found.      Scheduled Meds: .  stroke: mapping our early stages of recovery book   Does not apply Once  . aspirin EC  81 mg Oral Daily  . atorvastatin  40 mg Oral q1800  . clopidogrel  75 mg Oral Daily  . enoxaparin (LOVENOX) injection  30 mg Subcutaneous Q24H  . latanoprost  1  drop Both Eyes QHS  . levothyroxine  50 mcg Oral QAC breakfast  . multivitamin with minerals  1 tablet Oral Daily  . pantoprazole  40 mg Oral Daily  . sodium chloride flush  3 mL Intravenous Once   Continuous Infusions:   LOS: 7 days     Georgette Shell, MD [facility]  If 7PM-7AM, please contact night-coverage [email or web address] Password [facility] 05/19/2019, 10:22 AM

## 2019-05-20 MED ORDER — SENNOSIDES-DOCUSATE SODIUM 8.6-50 MG PO TABS: 1.0000 | ORAL_TABLET | Freq: Every evening | ORAL | Status: AC | PRN

## 2019-05-20 MED ORDER — ATORVASTATIN CALCIUM 40 MG PO TABS: 40.0000 mg | ORAL_TABLET | Freq: Every day | ORAL | 0 refills | Status: DC

## 2019-05-20 MED ORDER — CLOPIDOGREL BISULFATE 75 MG PO TABS: 75.0000 mg | ORAL_TABLET | Freq: Every day | ORAL | 0 refills | Status: DC

## 2019-05-20 NOTE — Progress Notes (Signed)
Patient ID: Robert Rowe, male   DOB: [birth year], 85 y.o.   MRN: [id number]  This NP visited patient at the bedside as a follow up for palliative medicine needs and emotional support.  Patient remains weak and intermittently confused, poor po intake and high risk for decompensation.  Spoke to wife by telephone, plan is for patient to [place] home today.  Discussed natural trajectory and expectations at EOL Again offered education regarding hospice services and referral process.  Discussed with famiy the importance of continued conversation with her  family and the  medical providers regarding overall plan of care and treatment options,  ensuring decisions are within the context of the patients values and GOCs.  Questions and concerns addressed      Total time spent on the unit was 20 minutes  Greater than 50% of the time was spent in counseling and coordination of care  Wadie Lessen NP  Palliative Medicine Team Team Phone # [phone] Pager [phone]

## 2019-05-20 NOTE — Progress Notes (Signed)
Patient being discharged home with home health. Education and instructions given to patient. IV removed. CCMD notified. Patient leaving unit via [facility].

## 2019-05-20 NOTE — Care Management Important Message (Signed)
Important Message  Patient Details  Name: Robert Rowe MRN: [id number] Date of Birth: June 08, 1918   Medicare Important Message Given:  Yes     Umaima Scholten [address] 05/20/2019, 3:53 PM

## 2019-05-20 NOTE — TOC Transition Note (Signed)
Transition of Care [facility]) - CM/SW Discharge Note   Patient Details  Name: Robert Rowe MRN: [id number] Date of Birth: 04-30-1918  [facility] CM/SW Contact:  Pollie Friar, RN Phone Number: 05/20/2019, 11:08 AM   Clinical Narrative:    Pt discharging home with [facility] services. TOC updated son: [name]. [name] states bed has been delivered to the home. He asked for PTAR home. TOC called and arranged transport after verifying home address. Transport packet at [employer]. Bedside RN updated.  Palliative care inquired about palliative at home. TOC asked [name] and he doesn't want palliative care at this time.    Final next level of care: Home w Home Health Services Barriers to Discharge: No Barriers Identified   Patient Goals and CMS Choice   CMS Medicare.gov Compare Post Acute Care list provided to:: Patient Represenative (must comment) Choice offered to / list presented to : Adult Children  Discharge Placement                Patient to be transferred to facility by: Home via [place] Name of family member notified: [name] at 11:00 am Patient and family notified of of transfer: 05/20/19  Discharge Plan and Services   Discharge Planning Services: CM Consult            DME Arranged: Hospital bed, Other see comment(condom caths) DME Agency: ([facility] for the bed/ [facility] for the condom caths) Date DME Agency Contacted: 05/17/19 Time DME Agency Contacted: 54 Representative spoke with at DME Agency: [place]: PT, OT, Nurse's Aide, Speech Therapy, Social Work [employer] Agency: [place] Date [place]: 05/17/19 Time [place]: 1206 Representative spoke with at [place]: [place] ([place]) Interventions     Readmission Risk Interventions No flowsheet data found.

## 2019-05-20 NOTE — Discharge Summary (Signed)
Physician Discharge Summary  Robert Rowe KAJ:[id number] DOB: [birth year] DOA: 05/12/2019  PCP: [name], MD  Admit date: 05/12/2019 Discharge date: 05/20/2019  Admitted From:home Disposition: home  Recommendations for Outpatient Follow-up:  1. Follow up with PCP in 1-2 weeks 2. 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 He reported about 12 hours of right-sided weakness, associated with paresthesias including his face , along withslurred speech.Initial physical examination blood pressure 159/98, heart rate 83, respirate 18, temperature 97.7, oxygen saturation 95%. He had moist mucous membranes, his lungs were clear to auscultation bilaterally, heart S1-S2 present with me, the abdomen was soft. He had mild right facial droop, strength 4/5 right upper-lower extremity,left side 5/5 strength.Brain MRI showed acute nonhemorrhagic infarct affecting the left paramedian pons. Advanced atrophy and small vessel disease. Widespread intracranial atherosclerosis disease,with moderate to severe flow-limiting tandem stenosis, most notable in the basilar artery. High-grade stenosis of the left M1MCA.EKG, 86 bpm, normal axis, normal intervals, sinus rhythm, no ST segment or T wave changes.  Patient was admitted to the hospital with diagnosis of acute left pontine ischemic stroke.   Discharge Diagnoses:  Principal Problem:   CVA (cerebral vascular accident) ([place]) Active Problems:   Dyslipidemia   GERD   Dysphagia   Chronic diastolic CHF  (congestive heart failure) ([facility])   Essential hypertension   Weakness generalized   CKD (chronic kidney disease) stage 3, GFR 30-59 ml/min ([place])   Palliative care by specialist   DNR (do not resuscitate) discussion    1.Acute ischemic left pontine stroke. Patient continue to have right sided hemiparesis and right facial droop.MRA - Intracerebral atherosclerosis with stenosis. Echocardiogram with preserved LV systolic function. LDL 129. patient will continue aspirin and clopidogrel for 3 months and then continue aspirin alone. Continue statin therapy. Will arrange for home health services, patient does not qualify for inpatient rehab.dw son [name] will discharge today  home with home health physical therapy.  2.Chronic diastolic heart failure. Clinically stable with no signs of exacerbation, continue telemetry monitoring.  3.Chronic kidney disease stage III. stable.    4.Hypothyroidism.Continue with levothyroxine.   5. Swallow dysfunction. Related to CVA, will continue aspiration precautions with dysphagia one diet, chest film and chest Ct personally reviewed no pneumonic infiltrate  Estimated body mass index is 26.69 kg/m as calculated from the following:   Height as of this encounter: 5\' 2"  (1.575 m).   Weight as of this encounter: 66.2 kg.  Discharge Instructions   Allergies as of 05/20/2019      Reactions   Codeine Other (See Comments)   Patient was "all over the place"- undesired side effect   Sulfasalazine Other (See Comments)   Reaction not recalled   Sulfonamide Derivatives Other (See Comments)   Reaction not recalled      Medication List    STOP taking these medications   acetaminophen 500 MG tablet Commonly known as: TYLENOL   albuterol 108 (90 Base) MCG/ACT inhaler Commonly known as: VENTOLIN HFA   furosemide 20 MG tablet Commonly known as: LASIX   traMADol 50 MG tablet Commonly known as: ULTRAM     TAKE these medications   aspirin EC 81 MG  tablet Take 81 mg  by mouth daily.   atorvastatin 40 MG tablet Commonly known as: LIPITOR Take 1 tablet (40 mg total) by mouth daily at 6 PM.   clopidogrel 75 MG tablet Commonly known as: PLAVIX Take 1 tablet (75 mg total) by mouth daily. Take aspirin and plavix together for 3 months then take aspirin only What changed: additional instructions   latanoprost 0.005 % ophthalmic solution Commonly known as: XALATAN Place 1 drop into both eyes at bedtime. For Glaucoma   levothyroxine 50 MCG tablet Commonly known as: SYNTHROID Take 50 mcg by mouth daily before breakfast. For Hypothyroidism   magnesium hydroxide 400 MG/5ML suspension Commonly known as: MILK OF MAGNESIA Constipation (1 of 4 ) If no BM in 3 days, give 30 cc Milk of Magnesium p.o. x 1 dose in 24 hours as needed ( Do  Not use standing constipation orders for residents with renal failure CFR less than 30. Contact MD for orders)   meclizine 12.5 MG tablet Commonly known as: ANTIVERT Take 1 tablet (12.5 mg total) by mouth 3 (three) times daily as needed for dizziness.   metoprolol succinate 25 MG 24 hr tablet Commonly known as: TOPROL-XL Take 1 tablet (25 mg total) by mouth at bedtime.   multivitamin with minerals Tabs tablet Take 1 tablet by mouth daily with breakfast.   nitroGLYCERIN 0.4 MG SL tablet Commonly known as: NITROSTAT Place 0.4 mg under the tongue every 5 (five) minutes as needed (for Esophageal spasms).   pantoprazole 40 MG tablet Commonly known as: PROTONIX Take 1 tablet (40 mg total) by mouth daily.   senna-docusate 8.6-50 MG tablet Commonly known as: Senokot-S Take 1 tablet by mouth at bedtime as needed for moderate constipation.   triamcinolone cream 0.1 % Commonly known as: KENALOG Apply to affected rash bid prn. What changed:   how much to take  how to take this  when to take this  reasons to take this  additional instructions            Durable Medical Equipment  (From  admission, onward)         Start     Ordered   05/17/19 1343  For home use only DME Hospital bed  Once    Question Answer Comment  Length of Need 6 Months   The above medical condition requires: Patient requires the ability to reposition frequently   Head must be elevated greater than: 45 degrees   Bed type Semi-electric      05/17/19 1342   05/17/19 1203  For home use only DME Other see comment  Once    Comments: Small Condom caths with foley bags  Question:  Length of Need  Answer:  Lifetime   05/17/19 1204         Follow-up Information    [name], MD Follow up.   Specialty: Family Medicine Contact information: [address] [place] [address] [phone]        Pixie Casino, MD .   Specialty: Cardiology Contact information: [place] [address] [phone]        Cameron Sprang, MD Follow up.   Specialty: Neurology Contact information: [address] [place] [address] [phone]          Allergies  Allergen Reactions  . Codeine Other (See Comments)    Patient was "all over the place"- undesired side effect  . Sulfasalazine Other (See Comments)    Reaction not  recalled  . Sulfonamide Derivatives Other (See Comments)    Reaction not recalled    Consultations: neuro  Procedures/Studies: Ct Angio Head W Or Wo Contrast  Result Date: 05/13/2019 CLINICAL DATA:  Right-sided weakness EXAM: CT ANGIOGRAPHY HEAD AND NECK TECHNIQUE: Multidetector CT imaging of the head and neck was performed using the standard protocol during bolus administration of intravenous contrast. Multiplanar CT image reconstructions and MIPs were obtained to evaluate the vascular anatomy. Carotid stenosis measurements (when applicable) are obtained utilizing NASCET criteria, using the distal internal carotid diameter as the denominator. CONTRAST:  20mL OMNIPAQUE IOHEXOL 350 MG/ML SOLN COMPARISON:  Head CT  05/12/2019 FINDINGS: CTA NECK FINDINGS SKELETON: Large pannus at C2 that causes spinal canal stenosis and compression of the spinal cord as demonstrated on previous MRI. OTHER NECK: Normal pharynx, larynx and major salivary glands. No cervical lymphadenopathy. Unremarkable thyroid gland. UPPER CHEST: No pneumothorax or pleural effusion. No nodules or masses. AORTIC ARCH: There is mild calcific atherosclerosis of the aortic arch. There is no aneurysm, dissection or hemodynamically significant stenosis of the visualized ascending aorta and aortic arch. Conventional 3 vessel aortic branching pattern. The visualized proximal subclavian arteries are widely patent. RIGHT CAROTID SYSTEM: --Common carotid artery: There is noncalcified plaque within the right common carotid artery with less than 50% stenosis. --Internal carotid artery: No dissection, occlusion or aneurysm. There is mixed density atherosclerosis extending into the proximal ICA, resulting in 50% stenosis. --External carotid artery: No acute abnormality. LEFT CAROTID SYSTEM: --Common carotid artery: Widely patent origin without common carotid artery dissection or aneurysm. --Internal carotid artery: No dissection, occlusion or aneurysm. Mild atherosclerotic calcification at the carotid bifurcation without hemodynamically significant stenosis. --External carotid artery: No acute abnormality. VERTEBRAL ARTERIES: Left dominant configuration. Right vertebral artery origin is occluded, but most of the V1 segment is opacified. There is moderate stenosis of the left vertebral artery origin. The left V1-V3 segments are normal. The right vertebral artery is diffusely diminutive with long segment loss of opacification beginning at the C5 level. CTA HEAD FINDINGS POSTERIOR CIRCULATION: --Vertebral arteries: Severe stenosis of the left V4 segment due to predominantly calcified plaque. The right V4 segment is diminutive and only intermittently opacified. --Posterior  inferior cerebellar arteries (PICA): Patent origins from the vertebral arteries. The right PICA may be supplied via collateral flow from the left side. --Anterior inferior cerebellar arteries (AICA): Not clearly visualize --Basilar artery: Severe stenosis of the midportion of the basilar artery (12:24) --Superior cerebellar arteries: Patent proximally --Posterior cerebral arteries (PCA): Posterior communicating arteries are diminutive or absent. There is multifocal severe stenosis of the right PCA, which remains patent. Stenosis of the left PCA are greatest at the proximal P2 segment. ANTERIOR CIRCULATION: --Intracranial internal carotid arteries: Atherosclerotic calcification of the internal carotid arteries at the skull base without hemodynamically significant stenosis. --Anterior cerebral arteries (ACA): Normal. Absent right A1 segment, normal variant --Middle cerebral arteries (MCA): There is focal hypoattenuation of the proximal left M1 segment, likely indicating nonocclusive thrombus (series 8, image 107, series 11, image 20). The inferior M2 division of the left MCA is occluded beginning at the MCA bifurcation (series 10, images 140 3-147), extending for a length of approximately 7 mm. The distal branches of the left MCA are patent. VENOUS SINUSES: As permitted by contrast timing, patent. ANATOMIC VARIANTS: None Review of the MIP images confirms the above findings. IMPRESSION: 1. Nonocclusive thrombus within the proximal M1 segment of the left middle cerebral artery, with occlusion of the inferior division left M2 segment beginning  at the MCA bifurcation and extending over length of approximately 7 mm. Compared to the MRI/MRA of 12/31/2018, these findings are unchanged. These results were called by telephone at the time of interpretation on 05/13/2019 at 2:40 am to Dr. Kerney Elbe , who verbally acknowledged these results. 2. 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 3. Severe stenosis of the basilar artery and multifocal bilateral posterior cerebral artery severe stenoses, also unchanged. 4. Approximately 50% stenosis of the proximal right internal carotid artery which is otherwise normal. 5. Large amount of pannus dorsal to the odontoid process with spinal canal stenosis, better characterized on recent MRI. Electronically Signed   By: Ulyses Jarred M.D.   On: 05/13/2019 02:43   Ct Head Wo Contrast  Result Date: 05/16/2019 CLINICAL DATA:  Altered level of consciousness, follow-up stroke EXAM: CT HEAD WITHOUT CONTRAST TECHNIQUE: Contiguous axial images were obtained from the base of the skull through the vertex without intravenous contrast. COMPARISON:  05/12/2019 FINDINGS: Brain: No evidence of acute infarction, hemorrhage, hydrocephalus, extra-axial collection or mass lesion/mass effect. Mild periventricular white matter hypodensity and global volume loss. Vascular: No hyperdense vessel or unexpected calcification. Skull: Normal. Negative for fracture or focal lesion. Sinuses/Orbits: No acute finding.  Bilateral scleral banding. Other: Large pannus of the transverse ligament of atlantoaxial articulation with compression of the upper cervical spinal cord, as better demonstrated by prior MRI. IMPRESSION: 1. No acute intracranial pathology. No non-contrast CT evidence of involving infarction or hemorrhage. Mild small-vessel white matter disease and global volume loss in keeping with advanced patient age. 2. Large pannus of the transverse ligament of atlantoaxial articulation with compression of the upper cervical spinal cord, as better demonstrated by prior MRI. Electronically Signed   By: Eddie Candle M.D.   On: 05/16/2019 09:53   Ct Angio Neck W Or Wo Contrast  Result Date: 05/13/2019 CLINICAL DATA:  Right-sided weakness EXAM: CT ANGIOGRAPHY HEAD AND NECK TECHNIQUE: Multidetector CT imaging of the head and neck was performed using  the standard protocol during bolus administration of intravenous contrast. Multiplanar CT image reconstructions and MIPs were obtained to evaluate the vascular anatomy. Carotid stenosis measurements (when applicable) are obtained utilizing NASCET criteria, using the distal internal carotid diameter as the denominator. CONTRAST:  47mL OMNIPAQUE IOHEXOL 350 MG/ML SOLN COMPARISON:  Head CT 05/12/2019 FINDINGS: CTA NECK FINDINGS SKELETON: Large pannus at C2 that causes spinal canal stenosis and compression of the spinal cord as demonstrated on previous MRI. OTHER NECK: Normal pharynx, larynx and major salivary glands. No cervical lymphadenopathy. Unremarkable thyroid gland. UPPER CHEST: No pneumothorax or pleural effusion. No nodules or masses. AORTIC ARCH: There is mild calcific atherosclerosis of the aortic arch. There is no aneurysm, dissection or hemodynamically significant stenosis of the visualized ascending aorta and aortic arch. Conventional 3 vessel aortic branching pattern. The visualized proximal subclavian arteries are widely patent. RIGHT CAROTID SYSTEM: --Common carotid artery: There is noncalcified plaque within the right common carotid artery with less than 50% stenosis. --Internal carotid artery: No dissection, occlusion or aneurysm. There is mixed density atherosclerosis extending into the proximal ICA, resulting in 50% stenosis. --External carotid artery: No acute abnormality. LEFT CAROTID SYSTEM: --Common carotid artery: Widely patent origin without common carotid artery dissection or aneurysm. --Internal carotid artery: No dissection, occlusion or aneurysm. Mild atherosclerotic calcification at the carotid bifurcation without hemodynamically significant stenosis. --External carotid artery: No acute abnormality. VERTEBRAL ARTERIES: Left dominant configuration. Right vertebral artery  origin is occluded, but most of the V1 segment is opacified. There is moderate stenosis of the left vertebral artery  origin. The left V1-V3 segments are normal. The right vertebral artery is diffusely diminutive with long segment loss of opacification beginning at the C5 level. CTA HEAD FINDINGS POSTERIOR CIRCULATION: --Vertebral arteries: Severe stenosis of the left V4 segment due to predominantly calcified plaque. The right V4 segment is diminutive and only intermittently opacified. --Posterior inferior cerebellar arteries (PICA): Patent origins from the vertebral arteries. The right PICA may be supplied via collateral flow from the left side. --Anterior inferior cerebellar arteries (AICA): Not clearly visualize --Basilar artery: Severe stenosis of the midportion of the basilar artery (12:24) --Superior cerebellar arteries: Patent proximally --Posterior cerebral arteries (PCA): Posterior communicating arteries are diminutive or absent. There is multifocal severe stenosis of the right PCA, which remains patent. Stenosis of the left PCA are greatest at the proximal P2 segment. ANTERIOR CIRCULATION: --Intracranial internal carotid arteries: Atherosclerotic calcification of the internal carotid arteries at the skull base without hemodynamically significant stenosis. --Anterior cerebral arteries (ACA): Normal. Absent right A1 segment, normal variant --Middle cerebral arteries (MCA): There is focal hypoattenuation of the proximal left M1 segment, likely indicating nonocclusive thrombus (series 8, image 107, series 11, image 20). The inferior M2 division of the left MCA is occluded beginning at the MCA bifurcation (series 10, images 140 3-147), extending for a length of approximately 7 mm. The distal branches of the left MCA are patent. VENOUS SINUSES: As permitted by contrast timing, patent. ANATOMIC VARIANTS: None Review of the MIP images confirms the above findings. IMPRESSION: 1. Nonocclusive thrombus within the proximal M1 segment of the left middle cerebral artery, with occlusion of the inferior division left M2 segment beginning  at the MCA bifurcation and extending over length of approximately 7 mm. Compared to the MRI/MRA of 12/31/2018, these findings are unchanged. These results were called by telephone at the time of interpretation on 05/13/2019 at 2:40 am to Dr. Kerney Elbe , who verbally acknowledged these results. 2. 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 3. Severe stenosis of the basilar artery and multifocal bilateral posterior cerebral artery severe stenoses, also unchanged. 4. Approximately 50% stenosis of the proximal right internal carotid artery which is otherwise normal. 5. Large amount of pannus dorsal to the odontoid process with spinal canal stenosis, better characterized on recent MRI. Electronically Signed   By: Ulyses Jarred M.D.   On: 05/13/2019 02:43   Ct Chest Wo Contrast  Result Date: 05/16/2019 CLINICAL DATA:  Evaluate for pneumonia. EXAM: CT CHEST WITHOUT CONTRAST TECHNIQUE: Multidetector CT imaging of the chest was performed following the standard protocol without IV contrast. COMPARISON:  Chest x-ray from same day. FINDINGS: Cardiovascular: Normal heart size. No pericardial effusion. No thoracic aortic aneurysm. Coronary, aortic arch, and branch vessel atherosclerotic vascular disease. Normal caliber pulmonary arteries. Mediastinum/Nodes: No enlarged mediastinal or axillary lymph nodes. Thyroid gland, trachea, and esophagus demonstrate no significant findings. Unchanged large hiatal hernia. Lungs/Pleura: Subsegmental atelectasis in the left lower lobe adjacent to the hiatal hernia and in the right middle lobe adjacent to the right hemidiaphragm eventration. No focal consolidation, pleural effusion, or pneumothorax. No suspicious pulmonary nodule. Upper Abdomen: No acute abnormality. Musculoskeletal: Bilateral gynecomastia. New mild L1 compression fracture with 3 mm retropulsion. Mild adjacent paravertebral stranding suggests the  fracture is acute to subacute. Bilateral glenohumeral osteoarthritis. Severe atrophy of the bilateral rotator cuff muscles. IMPRESSION:  1. No pneumonia. Subsegmental atelectasis in the left lower lobe adjacent to a large hiatal hernia. 2. Acute to subacute mild L1 compression fracture with 3 mm retropulsion. 3.  Aortic atherosclerosis (ICD10-I70.0). Electronically Signed   By: Titus Dubin M.D.   On: 05/16/2019 20:04   Mr Angio Head Wo Contrast  Result Date: 05/12/2019 CLINICAL DATA:  RIGHT-sided weakness and slurred speech. EXAM: MRI HEAD WITHOUT CONTRAST MRA HEAD WITHOUT CONTRAST TECHNIQUE: Multiplanar, multiecho pulse sequences of the brain and surrounding structures were obtained without intravenous contrast. Angiographic images of the head were obtained using MRA technique without contrast. COMPARISON:  CT head earlier today. MR head 12/31/2018. MRI cervical spine described separately. FINDINGS: MRI HEAD FINDINGS Brain: There is a cluster of punctate foci, restricted diffusion, LEFT paramedian pons, corresponding low ADC, consistent with acute infarction. No hemorrhage, mass lesion, or extra-axial fluid. Advanced atrophy, not unexpected for age, with hydrocephalus ex vacuo. Mild T2 and FLAIR hyperintensities in the white matter, likely small vessel disease. Vascular: Reported separately. Skull and upper cervical spine: Normal marrow signal. There is extreme pannus surrounding the odontoid, with severe cervicomedullary compression. Sinuses/Orbits: No acute findings.  BILATERAL cataract extraction. Other: No mastoid fluid. MRA HEAD FINDINGS RIGHT ICA: Tandem 50% stenoses in the cavernous segment. Cervical and petrous segments widely patent. Mild narrowing of the supraclinoid ICA, with widely patent terminus. LEFT ICA: 50% stenosis of the cavernous/supraclinoid ICA junction. No flow reducing stenoses more distally. ICA terminus widely patent. RIGHT MCA: 50-75% stenosis in its mid M1 segment. The MCA  bifurcation demonstrates mild to moderate disease affecting the M2 segments and beyond. LEFT MCA: Critical stenosis proximal M1 segment, 5 mm segment of diminished to absent flow related enhancement. Poor flow related enhancement of MCA vessels distally particularly the LEFT M2 inferior division. LEFT ACA: Dominant segment. Disc is rise to both distal anterior cerebral arteries RIGHT ACA: Atretic or occluded. Basilar artery: Multiple segmental stenoses, at least two of which are significantly flow reducing. 75% stenosis in the proximal basilar. 75-90% stenosis in the distal basilar. 50-75% stenosis in the mid basilar. LEFT vertebral: Widely patent. RIGHT vertebral: Severely diseased, occluded at skull base. High-grade stenosis distal V4 segment, likely retrograde filling. Posterior cerebral arteries: Severe BILATERAL flow-limiting stenoses, 75-90% in the P1-P2 junctions. Beyond this, BILATERAL segmental PCA stenoses likely flow reducing, greater on the RIGHT. Cerebellar branches: Poorly seen. IMPRESSION: Acute nonhemorrhagic infarction affecting the LEFT paramedian pons. Advanced atrophy and mild small vessel disease. Widespread intracranial atherosclerotic disease, with moderate to severe flow-limiting tandem stenoses, most notable in the basilar artery. High-grade stenosis of the LEFT M1 MCA is also of concern. See discussion above. Electronically Signed   By: Staci Righter M.D.   On: 05/12/2019 15:36   Mr Brain Wo Contrast  Result Date: 05/12/2019 CLINICAL DATA:  RIGHT-sided weakness and slurred speech. EXAM: MRI HEAD WITHOUT CONTRAST MRA HEAD WITHOUT CONTRAST TECHNIQUE: Multiplanar, multiecho pulse sequences of the brain and surrounding structures were obtained without intravenous contrast. Angiographic images of the head were obtained using MRA technique without contrast. COMPARISON:  CT head earlier today. MR head 12/31/2018. MRI cervical spine described separately. FINDINGS: MRI HEAD FINDINGS Brain: There  is a cluster of punctate foci, restricted diffusion, LEFT paramedian pons, corresponding low ADC, consistent with acute infarction. No hemorrhage, mass lesion, or extra-axial fluid. Advanced atrophy, not unexpected for age, with hydrocephalus ex vacuo. Mild T2 and FLAIR hyperintensities in the white matter, likely small vessel disease. Vascular: Reported separately. Skull and upper cervical spine: Normal  marrow signal. There is extreme pannus surrounding the odontoid, with severe cervicomedullary compression. Sinuses/Orbits: No acute findings.  BILATERAL cataract extraction. Other: No mastoid fluid. MRA HEAD FINDINGS RIGHT ICA: Tandem 50% stenoses in the cavernous segment. Cervical and petrous segments widely patent. Mild narrowing of the supraclinoid ICA, with widely patent terminus. LEFT ICA: 50% stenosis of the cavernous/supraclinoid ICA junction. No flow reducing stenoses more distally. ICA terminus widely patent. RIGHT MCA: 50-75% stenosis in its mid M1 segment. The MCA bifurcation demonstrates mild to moderate disease affecting the M2 segments and beyond. LEFT MCA: Critical stenosis proximal M1 segment, 5 mm segment of diminished to absent flow related enhancement. Poor flow related enhancement of MCA vessels distally particularly the LEFT M2 inferior division. LEFT ACA: Dominant segment. Disc is rise to both distal anterior cerebral arteries RIGHT ACA: Atretic or occluded. Basilar artery: Multiple segmental stenoses, at least two of which are significantly flow reducing. 75% stenosis in the proximal basilar. 75-90% stenosis in the distal basilar. 50-75% stenosis in the mid basilar. LEFT vertebral: Widely patent. RIGHT vertebral: Severely diseased, occluded at skull base. High-grade stenosis distal V4 segment, likely retrograde filling. Posterior cerebral arteries: Severe BILATERAL flow-limiting stenoses, 75-90% in the P1-P2 junctions. Beyond this, BILATERAL segmental PCA stenoses likely flow reducing, greater  on the RIGHT. Cerebellar branches: Poorly seen. IMPRESSION: Acute nonhemorrhagic infarction affecting the LEFT paramedian pons. Advanced atrophy and mild small vessel disease. Widespread intracranial atherosclerotic disease, with moderate to severe flow-limiting tandem stenoses, most notable in the basilar artery. High-grade stenosis of the LEFT M1 MCA is also of concern. See discussion above. Electronically Signed   By: Staci Righter M.D.   On: 05/12/2019 15:36   Mr Cervical Spine Wo Contrast  Result Date: 05/12/2019 CLINICAL DATA:  C-spine trauma, myelopathy. EXAM: MRI CERVICAL SPINE WITHOUT CONTRAST TECHNIQUE: Multiplanar, multisequence MR imaging of the cervical spine was performed. No intravenous contrast was administered. COMPARISON:  Partial visualization of cervicomedullary junction on prior MR brain studies. FINDINGS: Alignment: 6 mm anterolisthesis C7 on T1. There is 3 mm degenerative anterolisthesis C2-C3. Otherwise anatomic alignment. Vertebrae: Significant erosion of C2 due to pannus. Cord: Severe cord compression opposite C2, related to pannus. Abnormal cord signal is identified along the lower margin of the cord compression, see series 6, image 9. Posterior Fossa, vertebral arteries, paraspinal tissues: No tonsillar herniation. LEFT vertebral artery is dominant. RIGHT vertebral is occluded. Disc levels: C1-C2: Severe pannus, projects posteriorly resulting in cord compression as well as erodes the odontoid. Pannus measures up to 13 mm in anterior-posterior extent. Canal diameter measures 4 mm at its most narrow point. C2-3: 3 mm facet mediated anterolisthesis. No stenosis. BILATERAL C3 foraminal narrowing. C3-4: Central protrusion with osseous spurring. Facet arthropathy. BILATERAL C4 foraminal narrowing. C4-5: Central protrusion with osseous spurring. Facet arthropathy. BILATERAL C5 foraminal narrowing. C5-6: Central protrusion with osseous spurring. Facet arthropathy. BILATERAL C6 foraminal  narrowing. C6-7: Central protrusion with osseous spurring. RIGHT greater than LEFT C7 foraminal narrowing. C7-T1: 6 mm anterolisthesis. Advanced disc space narrowing. Facet arthropathy. BILATERAL C8 foraminal narrowing. 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 Abnormal cord signal is identified. Surgical consultation is warranted. Multilevel spondylosis resulting in foraminal narrowing from C2-3 through C7-T1. See discussion above. Electronically Signed   By: Staci Righter M.D.   On: 05/12/2019 15:46   Dg Chest Port 1 View  Result Date: 05/16/2019 CLINICAL DATA:  Lethargy EXAM: PORTABLE CHEST 1 VIEW COMPARISON:  10/30/2018 FINDINGS:  Low lung volumes. Streaky atelectasis or scar at the right base. Airspace opacity at the left base. Aortic atherosclerosis. No pneumothorax. IMPRESSION: 1. Low lung volumes. Airspace opacity in the left retrocardiac region, possible atelectasis or pneumonia. Potentially could reflect collapsed hiatal hernia given appearance on 10/30/2018 comparison radiograph. Electronically Signed   By: Donavan Foil M.D.   On: 05/16/2019 15:02   Ct Head Code Stroke Wo Contrast  Result Date: 05/12/2019 CLINICAL DATA:  Code stroke. Left-sided weakness slurred speech. LV0 positive EXAM: CT HEAD WITHOUT CONTRAST TECHNIQUE: Contiguous axial images were obtained from the base of the skull through the vertex without intravenous contrast. COMPARISON:  MRI head 12/31/2018 FINDINGS: Brain: Moderate atrophy with ventricular enlargement which is stable from prior studies. Negative for acute infarct.  Negative for acute hemorrhage or mass. Vascular: Negative for hyperdense vessel Skull: No skull lesions. Extensive calcified pannus posterior to the dens causing spinal stenosis as noted on prior MRI. Sinuses/Orbits: Mild mucosal edema paranasal sinuses. Bilateral cataract surgery. Other: None ASPECTS ([facility] Stroke Program Early  CT Score) - Ganglionic level infarction (caudate, lentiform nuclei, internal capsule, insula, M1-M3 cortex): 7 - Supraganglionic infarction (M4-M6 cortex): 3 Total score (0-10 with 10 being normal): 10 IMPRESSION: 1. No acute intracranial abnormality 2. Atrophy and mild chronic microvascular ischemia 3. ASPECTS is 10 4. Severe pannus posterior to the dens causing spinal stenosis and cord compression on the right as noted on prior MRI 5. These results were called by telephone at the time of interpretation on 05/12/2019 at 11:10 am to Dr. Rory Percy, who verbally acknowledged these results. Electronically Signed   By: Franchot Gallo M.D.   On: 05/12/2019 11:13    (Echo, Carotid, EGD, Colonoscopy, ERCP)    Subjective: Patient resting in bed anxious to go home  Discharge Exam: Vitals:   05/20/19 0423 05/20/19 0753  BP: 131/74 (!) 147/80  Pulse: 94 97  Resp: 18 18  Temp: 99 F (37.2 C) 98 F (36.7 C)  SpO2: 91% 92%   Vitals:   05/19/19 2022 05/19/19 2354 05/20/19 0423 05/20/19 0753  BP: (!) 107/45 (!) 106/59 131/74 (!) 147/80  Pulse: (!) 105 94 94 97  Resp: 18 17 18 18   Temp: 99.1 F (37.3 C) 98.5 F (36.9 C) 99 F (37.2 C) 98 F (36.7 C)  TempSrc: Oral Oral Oral Axillary  SpO2: 90% 93% 91% 92%  Weight:      Height:        General: Pt is alert, awake, not in acute distress Cardiovascular: RRR, S1/S2 +, no rubs, no gallops Respiratory: CTA bilaterally, no wheezing, no rhonchi Abdominal: Soft, NT, ND, bowel sounds + Extremities: no edema, no cyanosis Neuro patient awake oriented to hospital.  Right facial droop present.  Right upper and lower extremity weakness with 2 x 5 to the right lower extremity and 0 to 1 x 5 to the right upper extremity    The results of significant diagnostics from this hospitalization (including imaging, microbiology, ancillary and laboratory) are listed below for reference.     Microbiology: Recent Results (from the past 240 hour(s))  SARS Coronavirus 2  (CEPHEID - Performed in [place] hospital lab), Hosp Order     Status: None   Collection Time: 05/12/19  3:36 PM   Specimen: Nasopharyngeal Swab  Result Value Ref Range Status   SARS Coronavirus 2 NEGATIVE NEGATIVE Final    Comment: (NOTE) If result is NEGATIVE SARS-CoV-2 target nucleic acids are NOT DETECTED. The SARS-CoV-2 RNA is generally detectable  in upper and lower  respiratory specimens during the acute phase of infection. The lowest  concentration of SARS-CoV-2 viral copies this assay can detect is 250  copies / mL. A negative result does not preclude SARS-CoV-2 infection  and should not be used as the sole basis for treatment or other  patient management decisions.  A negative result may occur with  improper specimen collection / handling, submission of specimen other  than nasopharyngeal swab, presence of viral mutation(s) within the  areas targeted by this assay, and inadequate number of viral copies  (<250 copies / mL). A negative result must be combined with clinical  observations, patient history, and epidemiological information. If result is POSITIVE SARS-CoV-2 target nucleic acids are DETECTED. The SARS-CoV-2 RNA is generally detectable in upper and lower  respiratory specimens dur ing the acute phase of infection.  Positive  results are indicative of active infection with SARS-CoV-2.  Clinical  correlation with patient history and other diagnostic information is  necessary to determine patient infection status.  Positive results do  not rule out bacterial infection or co-infection with other viruses. If result is PRESUMPTIVE POSTIVE SARS-CoV-2 nucleic acids MAY BE PRESENT.   A presumptive positive result was obtained on the submitted specimen  and confirmed on repeat testing.  While 2019 novel coronavirus  (SARS-CoV-2) nucleic acids may be present in the submitted sample  additional confirmatory testing may be necessary for epidemiological  and / or clinical  management purposes  to differentiate between  SARS-CoV-2 and other Sarbecovirus currently known to infect humans.  If clinically indicated additional testing with an alternate test  methodology [phone]) is advised. The SARS-CoV-2 RNA is generally  detectable in upper and lower respiratory sp ecimens during the acute  phase of infection. The expected result is Negative. Fact Sheet for Patients:  StrictlyIdeas.no Fact Sheet for Healthcare Providers: BankingDealers.co.za This test is not yet approved or cleared by the [place] FDA and has been authorized for detection and/or diagnosis of SARS-CoV-2 by FDA under an Emergency Use Authorization (EUA).  This EUA will remain in effect (meaning this test can be used) for the duration of the COVID-19 declaration under Section 564(b)(1) of the Act, 21 U.S.C. section 360bbb-3(b)(1), unless the authorization is terminated or revoked sooner. Performed at [facility], [place] [address] [place], [place] [address]      Labs: BNP (last 3 results) No results for input(s): BNP in the last 8760 hours. Basic Metabolic Panel: Recent Labs  Lab 05/14/19 0752 05/16/19 1336 05/18/19 0421 05/19/19 0613  NA 145 143 143  --   K 3.8 4.1 3.6  --   CL 116* 110 112*  --   CO2 22 23 23   --   GLUCOSE 96 124* 148*  --   BUN 14 15 14   --   CREATININE 1.40* 1.61* 1.39* 1.56*  CALCIUM 8.5* 8.9 8.7*  --    Liver Function Tests: Recent Labs  Lab 05/16/19 1336  AST 16  ALT 12  ALKPHOS 61  BILITOT 1.3*  PROT 5.4*  ALBUMIN 2.9*   No results for input(s): LIPASE, AMYLASE in the last 168 hours. No results for input(s): AMMONIA in the last 168 hours. CBC: Recent Labs  Lab 05/14/19 0752 05/16/19 1336  WBC 6.2 8.9  NEUTROABS  --  6.3  HGB 12.9* 13.2  HCT 39.7 41.1  MCV 97.8 97.6  PLT 192 181   Cardiac Enzymes: No results for input(s): CKTOTAL, CKMB, CKMBINDEX, TROPONINI in the  last 168  hours. BNP: Invalid input(s): POCBNP CBG: Recent Labs  Lab 05/13/19 1718 05/16/19 1105  GLUCAP 90 117*   D-Dimer No results for input(s): DDIMER in the last 72 hours. Hgb A1c No results for input(s): HGBA1C in the last 72 hours. Lipid Profile No results for input(s): CHOL, HDL, LDLCALC, TRIG, CHOLHDL, LDLDIRECT in the last 72 hours. Thyroid function studies No results for input(s): TSH, T4TOTAL, T3FREE, THYROIDAB in the last 72 hours.  Invalid input(s): FREET3 Anemia work up No results for input(s): VITAMINB12, FOLATE, FERRITIN, TIBC, IRON, RETICCTPCT in the last 72 hours. Urinalysis    Component Value Date/Time   COLORURINE YELLOW 01/13/2019 0027   APPEARANCEUR CLEAR 01/13/2019 0027   LABSPEC 1.025 01/13/2019 0027   PHURINE 6.0 01/13/2019 0027   GLUCOSEU NEGATIVE 01/13/2019 0027   HGBUR NEGATIVE 01/13/2019 0027   BILIRUBINUR NEGATIVE 01/13/2019 0027   KETONESUR NEGATIVE 01/13/2019 0027   PROTEINUR NEGATIVE 01/13/2019 0027   UROBILINOGEN 0.2 08/27/2015 1930   NITRITE NEGATIVE 01/13/2019 0027   LEUKOCYTESUR NEGATIVE 01/13/2019 0027   Sepsis Labs Invalid input(s): PROCALCITONIN,  WBC,  LACTICIDVEN Microbiology Recent Results (from the past 240 hour(s))  SARS Coronavirus 2 (CEPHEID - Performed in [place] hospital lab), Hosp Order     Status: None   Collection Time: 05/12/19  3:36 PM   Specimen: Nasopharyngeal Swab  Result Value Ref Range Status   SARS Coronavirus 2 NEGATIVE NEGATIVE Final    Comment: (NOTE) If result is NEGATIVE SARS-CoV-2 target nucleic acids are NOT DETECTED. The SARS-CoV-2 RNA is generally detectable in upper and lower  respiratory specimens during the acute phase of infection. The lowest  concentration of SARS-CoV-2 viral copies this assay can detect is 250  copies / mL. A negative result does not preclude SARS-CoV-2 infection  and should not be used as the sole basis for treatment or other  patient management decisions.  A negative result  may occur with  improper specimen collection / handling, submission of specimen other  than nasopharyngeal swab, presence of viral mutation(s) within the  areas targeted by this assay, and inadequate number of viral copies  (<250 copies / mL). A negative result must be combined with clinical  observations, patient history, and epidemiological information. If result is POSITIVE SARS-CoV-2 target nucleic acids are DETECTED. The SARS-CoV-2 RNA is generally detectable in upper and lower  respiratory specimens dur ing the acute phase of infection.  Positive  results are indicative of active infection with SARS-CoV-2.  Clinical  correlation with patient history and other diagnostic information is  necessary to determine patient infection status.  Positive results do  not rule out bacterial infection or co-infection with other viruses. If result is PRESUMPTIVE POSTIVE SARS-CoV-2 nucleic acids MAY BE PRESENT.   A presumptive positive result was obtained on the submitted specimen  and confirmed on repeat testing.  While 2019 novel coronavirus  (SARS-CoV-2) nucleic acids may be present in the submitted sample  additional confirmatory testing may be necessary for epidemiological  and / or clinical management purposes  to differentiate between  SARS-CoV-2 and other Sarbecovirus currently known to infect humans.  If clinically indicated additional testing with an alternate test  methodology [phone]) is advised. The SARS-CoV-2 RNA is generally  detectable in upper and lower respiratory sp ecimens during the acute  phase of infection. The expected result is Negative. Fact Sheet for Patients:  StrictlyIdeas.no Fact Sheet for Healthcare Providers: BankingDealers.co.za This test is not yet approved or cleared by the [place]  FDA and has been authorized for detection and/or diagnosis of SARS-CoV-2 by FDA under an Emergency Use Authorization (EUA).   This EUA will remain in effect (meaning this test can be used) for the duration of the COVID-19 declaration under Section 564(b)(1) of the Act, 21 U.S.C. section 360bbb-3(b)(1), unless the authorization is terminated or revoked sooner. Performed at [facility], [place] [address] [place], [place] [address]      Time coordinating discharge:  34 minutes  SIGNED:   Georgette Shell, MD  [facility] 05/20/2019, 8:34 AM Pager   If 7PM-7AM, please contact night-coverage [email or web address] Password [email or web address]

## 2019-05-21 ENCOUNTER — Other Ambulatory Visit: Payer: Self-pay

## 2019-05-21 ENCOUNTER — Ambulatory Visit (INDEPENDENT_AMBULATORY_CARE_PROVIDER_SITE_OTHER): Payer: Medicare Other | Admitting: Family Medicine

## 2019-05-21 DIAGNOSIS — I10 Essential (primary) hypertension: Secondary | ICD-10-CM | POA: Diagnosis not present

## 2019-05-21 DIAGNOSIS — E785 Hyperlipidemia, unspecified: Secondary | ICD-10-CM

## 2019-05-21 DIAGNOSIS — N183 Chronic kidney disease, stage 3 (moderate): Secondary | ICD-10-CM | POA: Diagnosis not present

## 2019-05-21 DIAGNOSIS — I69351 Hemiplegia and hemiparesis following cerebral infarction affecting right dominant side: Secondary | ICD-10-CM | POA: Diagnosis not present

## 2019-05-21 DIAGNOSIS — M4313 Spondylolisthesis, cervicothoracic region: Secondary | ICD-10-CM | POA: Diagnosis not present

## 2019-05-21 DIAGNOSIS — K219 Gastro-esophageal reflux disease without esophagitis: Secondary | ICD-10-CM

## 2019-05-21 DIAGNOSIS — I69851 Hemiplegia and hemiparesis following other cerebrovascular disease affecting right dominant side: Secondary | ICD-10-CM

## 2019-05-21 DIAGNOSIS — M4803 Spinal stenosis, cervicothoracic region: Secondary | ICD-10-CM | POA: Diagnosis not present

## 2019-05-21 DIAGNOSIS — I6300 Cerebral infarction due to thrombosis of unspecified precerebral artery: Secondary | ICD-10-CM

## 2019-05-21 DIAGNOSIS — I5032 Chronic diastolic (congestive) heart failure: Secondary | ICD-10-CM | POA: Diagnosis not present

## 2019-05-21 MED ORDER — ATORVASTATIN CALCIUM 40 MG PO TABS: 40.0000 mg | ORAL_TABLET | Freq: Every day | ORAL | 3 refills | Status: DC

## 2019-05-21 MED ORDER — NITROGLYCERIN 0.4 MG SL SUBL: 0.4000 mg | SUBLINGUAL_TABLET | SUBLINGUAL | 1 refills | Status: DC | PRN

## 2019-05-21 MED ORDER — TRAMADOL HCL 50 MG PO TABS: 50.0000 mg | ORAL_TABLET | Freq: Four times a day (QID) | ORAL | 0 refills | Status: AC | PRN

## 2019-05-21 MED ORDER — PANTOPRAZOLE SODIUM 40 MG PO TBEC: 40.0000 mg | DELAYED_RELEASE_TABLET | Freq: Every day | ORAL | 3 refills | Status: DC

## 2019-05-21 NOTE — Progress Notes (Addendum)
Patient ID: Robert Rowe, male   DOB: [birth year], 85 y.o.   MRN: [id number]  This visit type was conducted due to national recommendations for restrictions regarding the COVID-19 pandemic in an effort to limit this patient's exposure and mitigate transmission in our community.   Virtual Visit via Telephone Note  I connected with [name] on 05/21/19 at  3:00 PM EDT by telephone and verified that I am speaking with the correct person using two identifiers.   I discussed the limitations, risks, security and privacy concerns of performing an evaluation and management service by telephone and the availability of in person appointments. I also discussed with the patient that there may be a patient responsible charge related to this service. The patient expressed understanding and agreed to proceed.  Location patient: home Location provider: work or home office Participants present for the call: patient, provider, patient's son [name]), and patient's wife. Patient did not have a visit in the prior 7 days to address this/these issue(s).   History of Present Illness:  Hospital follow-up.  Patient has recent stroke and has some persistent slurred speech which makes communication with him difficult.  His son [name] assisted with this visit.  Patient was admitted on 05/12/2019 with right-sided weakness and slurred speech.  His chronic problems include history of TIA, dyslipidemia, chronic kidney disease, diastolic heart failure, rheumatoid arthritis, hypothyroidism, and GERD.  He had had about 12 hours of right-sided weakness and paresthesias including the face prior to admission.  MRI of brain revealed acute non-hemorrhagic infarct affecting the left paramedian pons.  He had advanced atrophy and advanced diffuse small vessel disease.  High-grade stenosis of the left M1 MCA.  LDL cholesterol 129.  Son states he has not been taking his Lipitor consistently.  Echocardiogram revealed preserved LV systolic  function.  Patient discharged on combination of aspirin and Plavix for 3 months then to be followed by aspirin alone.  They have home health services with PT and OT and speech therapy coming out tomorrow.  He is unable to ambulate.  The need refills of several medications including Protonix, nitroglycerin, Lipitor.  They are also requesting refill of tramadol which he has taken in the past.  Has had some night pain which I think is partly related to his immobility.  He seems fairly comfortable for the most part during the day.  He was apparently seen by speech therapy and recommendation for dysphagia 1 diet.  He had no pneumonia on recent imaging.  Patient requires a wheelchair secondary to recent CVA and inability to ambulate.  He has a mobility limitation that prevents him from completing ADLs.  He is unable to safely ambulate with an assistive device such as cane or rolling walker.  He has 24/7 caregiver assistance at all times who are able to assist in propelling the wheelchair. He requires maximum assistance for all transfers due to recent CVA.    Observations/Objective: Patient sounds cheerful and well on the phone. I do not appreciate any SOB. Speech and thought processing are grossly intact. Patient reported vitals:  Assessment and Plan:  #1 acute CVA left pons with right-sided weakness and slurred speech-overall clinically stable.  They state that he did not qualify for inpatient rehab.  -Continue multiple home health services as above including OT, PT, speech therapy -We will continue Plavix and aspirin for 3 months followed by aspirin -he needs manual wheelchair with pressure relieving cushion and a hoyer lift.  Length of need for both will  be lifetime.  #2 history of GERD. -Refill Protonix -Also refilled sublingual nitroglycerin which she is taken for esophageal spasms in the past  #3 hyperlipidemia.  Inconsistent use of Lipitor -Recommend Lipitor 40 mg daily and take more  consistently  #4 arthralgias -We agreed to refill tramadol 50 mg 1 every 6 hours as needed for severe pain  Follow Up Instructions:  -He has home health services including OT, PT, speech therapy  [id number] 5-10 99442 11-20 99443 21-30 I did not refer this patient for an OV in the next 24 hours for this/these issue(s).  I discussed the assessment and treatment plan with the patient. The patient was provided an opportunity to ask questions and all were answered. The patient agreed with the plan and demonstrated an understanding of the instructions.   The patient was advised to call back or seek an in-person evaluation if the symptoms worsen or if the condition fails to improve as anticipated.  I provided 30 minutes of non-face-to-face time during this encounter.   Carolann Littler, MD

## 2019-05-22 DIAGNOSIS — M4313 Spondylolisthesis, cervicothoracic region: Secondary | ICD-10-CM | POA: Diagnosis not present

## 2019-05-22 DIAGNOSIS — N183 Chronic kidney disease, stage 3 (moderate): Secondary | ICD-10-CM | POA: Diagnosis not present

## 2019-05-22 DIAGNOSIS — M4803 Spinal stenosis, cervicothoracic region: Secondary | ICD-10-CM | POA: Diagnosis not present

## 2019-05-22 DIAGNOSIS — I69351 Hemiplegia and hemiparesis following cerebral infarction affecting right dominant side: Secondary | ICD-10-CM | POA: Diagnosis not present

## 2019-05-22 DIAGNOSIS — I5032 Chronic diastolic (congestive) heart failure: Secondary | ICD-10-CM | POA: Diagnosis not present

## 2019-05-23 DIAGNOSIS — M4313 Spondylolisthesis, cervicothoracic region: Secondary | ICD-10-CM | POA: Diagnosis not present

## 2019-05-23 DIAGNOSIS — M4803 Spinal stenosis, cervicothoracic region: Secondary | ICD-10-CM | POA: Diagnosis not present

## 2019-05-23 DIAGNOSIS — Z515 Encounter for palliative care: Secondary | ICD-10-CM | POA: Diagnosis not present

## 2019-05-23 DIAGNOSIS — I5032 Chronic diastolic (congestive) heart failure: Secondary | ICD-10-CM | POA: Diagnosis not present

## 2019-05-23 DIAGNOSIS — N183 Chronic kidney disease, stage 3 (moderate): Secondary | ICD-10-CM | POA: Diagnosis not present

## 2019-05-23 DIAGNOSIS — I69351 Hemiplegia and hemiparesis following cerebral infarction affecting right dominant side: Secondary | ICD-10-CM | POA: Diagnosis not present

## 2019-05-23 DIAGNOSIS — N138 Other obstructive and reflux uropathy: Secondary | ICD-10-CM | POA: Diagnosis not present

## 2019-05-23 DIAGNOSIS — I639 Cerebral infarction, unspecified: Secondary | ICD-10-CM | POA: Diagnosis not present

## 2019-05-23 DIAGNOSIS — R1310 Dysphagia, unspecified: Secondary | ICD-10-CM | POA: Diagnosis not present

## 2019-05-24 DIAGNOSIS — I5032 Chronic diastolic (congestive) heart failure: Secondary | ICD-10-CM | POA: Diagnosis not present

## 2019-05-24 DIAGNOSIS — N183 Chronic kidney disease, stage 3 (moderate): Secondary | ICD-10-CM | POA: Diagnosis not present

## 2019-05-24 DIAGNOSIS — I69351 Hemiplegia and hemiparesis following cerebral infarction affecting right dominant side: Secondary | ICD-10-CM | POA: Diagnosis not present

## 2019-05-24 DIAGNOSIS — M4313 Spondylolisthesis, cervicothoracic region: Secondary | ICD-10-CM | POA: Diagnosis not present

## 2019-05-24 DIAGNOSIS — M4803 Spinal stenosis, cervicothoracic region: Secondary | ICD-10-CM | POA: Diagnosis not present

## 2019-05-27 ENCOUNTER — Telehealth: Payer: Self-pay | Admitting: Family Medicine

## 2019-05-27 DIAGNOSIS — I69351 Hemiplegia and hemiparesis following cerebral infarction affecting right dominant side: Secondary | ICD-10-CM | POA: Diagnosis not present

## 2019-05-27 DIAGNOSIS — I5032 Chronic diastolic (congestive) heart failure: Secondary | ICD-10-CM | POA: Diagnosis not present

## 2019-05-27 DIAGNOSIS — N183 Chronic kidney disease, stage 3 (moderate): Secondary | ICD-10-CM | POA: Diagnosis not present

## 2019-05-27 DIAGNOSIS — M4803 Spinal stenosis, cervicothoracic region: Secondary | ICD-10-CM | POA: Diagnosis not present

## 2019-05-27 DIAGNOSIS — M4313 Spondylolisthesis, cervicothoracic region: Secondary | ICD-10-CM | POA: Diagnosis not present

## 2019-05-27 NOTE — Telephone Encounter (Signed)
Caller/Agency: [facility] Number: [phone] [place] to leave Vm Requesting OT/PT/Skilled Nursing/Social Work/Speech Therapy: Nee Evaluation to Plan of Care, Would like to add Occupational Therapy, Need to add Standard manual Wheel chair 16x16 with pressure reliving cushion, need to [employer],   Frequency: 1 week 1 on OT for this week.

## 2019-05-28 DIAGNOSIS — I5032 Chronic diastolic (congestive) heart failure: Secondary | ICD-10-CM | POA: Diagnosis not present

## 2019-05-28 DIAGNOSIS — I69351 Hemiplegia and hemiparesis following cerebral infarction affecting right dominant side: Secondary | ICD-10-CM | POA: Diagnosis not present

## 2019-05-28 DIAGNOSIS — M4313 Spondylolisthesis, cervicothoracic region: Secondary | ICD-10-CM | POA: Diagnosis not present

## 2019-05-28 DIAGNOSIS — M4803 Spinal stenosis, cervicothoracic region: Secondary | ICD-10-CM | POA: Diagnosis not present

## 2019-05-28 DIAGNOSIS — N183 Chronic kidney disease, stage 3 (moderate): Secondary | ICD-10-CM | POA: Diagnosis not present

## 2019-05-28 NOTE — Telephone Encounter (Signed)
Please see message. °

## 2019-05-28 NOTE — Telephone Encounter (Signed)
Son [name] calling back to expedite the order for a standard manual wheelchair AND Hoyer lift. Pt needs this ASAP to get out of bed.  He is hoping this can be done immediately.

## 2019-05-28 NOTE — Telephone Encounter (Signed)
I don't know what we need to do to expedite this.  I am not sure he is a candidate yet for Hospice- but they can frequently expedite.  Would check with Apolonio Schneiders to see if she has any thoughts how to expedite these orders.  I believe DME such as this can be ordered through Epic.

## 2019-05-29 NOTE — Addendum Note (Signed)
Addended by: [name] on: 05/29/2019 11:06 PM   Modules accepted: Orders

## 2019-05-29 NOTE — Telephone Encounter (Signed)
I have sent this in a community message to [facility].   OK for the OT orders?

## 2019-05-29 NOTE — Telephone Encounter (Signed)
OK to set up OT

## 2019-05-30 DIAGNOSIS — Z8673 Personal history of transient ischemic attack (TIA), and cerebral infarction without residual deficits: Secondary | ICD-10-CM | POA: Diagnosis not present

## 2019-05-30 DIAGNOSIS — G459 Transient cerebral ischemic attack, unspecified: Secondary | ICD-10-CM | POA: Diagnosis not present

## 2019-05-30 DIAGNOSIS — M4313 Spondylolisthesis, cervicothoracic region: Secondary | ICD-10-CM | POA: Diagnosis not present

## 2019-05-30 DIAGNOSIS — R269 Unspecified abnormalities of gait and mobility: Secondary | ICD-10-CM | POA: Diagnosis not present

## 2019-05-30 DIAGNOSIS — N183 Chronic kidney disease, stage 3 (moderate): Secondary | ICD-10-CM | POA: Diagnosis not present

## 2019-05-30 DIAGNOSIS — I5032 Chronic diastolic (congestive) heart failure: Secondary | ICD-10-CM | POA: Diagnosis not present

## 2019-05-30 DIAGNOSIS — I69351 Hemiplegia and hemiparesis following cerebral infarction affecting right dominant side: Secondary | ICD-10-CM | POA: Diagnosis not present

## 2019-05-30 DIAGNOSIS — I69398 Other sequelae of cerebral infarction: Secondary | ICD-10-CM | POA: Diagnosis not present

## 2019-05-30 DIAGNOSIS — I639 Cerebral infarction, unspecified: Secondary | ICD-10-CM | POA: Diagnosis not present

## 2019-05-30 DIAGNOSIS — M4803 Spinal stenosis, cervicothoracic region: Secondary | ICD-10-CM | POA: Diagnosis not present

## 2019-05-30 NOTE — Telephone Encounter (Signed)
Called and LVM for Robert Rowe to okay PT and for nursing evaluation of pressure ulcer on left heel. Please f/u on wheel chair and hoyer lift.

## 2019-05-31 DIAGNOSIS — M4313 Spondylolisthesis, cervicothoracic region: Secondary | ICD-10-CM | POA: Diagnosis not present

## 2019-05-31 DIAGNOSIS — M4803 Spinal stenosis, cervicothoracic region: Secondary | ICD-10-CM | POA: Diagnosis not present

## 2019-05-31 DIAGNOSIS — I5032 Chronic diastolic (congestive) heart failure: Secondary | ICD-10-CM | POA: Diagnosis not present

## 2019-05-31 DIAGNOSIS — I69351 Hemiplegia and hemiparesis following cerebral infarction affecting right dominant side: Secondary | ICD-10-CM | POA: Diagnosis not present

## 2019-05-31 DIAGNOSIS — N183 Chronic kidney disease, stage 3 (moderate): Secondary | ICD-10-CM | POA: Diagnosis not present

## 2019-06-01 DIAGNOSIS — M4313 Spondylolisthesis, cervicothoracic region: Secondary | ICD-10-CM | POA: Diagnosis not present

## 2019-06-01 DIAGNOSIS — I69351 Hemiplegia and hemiparesis following cerebral infarction affecting right dominant side: Secondary | ICD-10-CM | POA: Diagnosis not present

## 2019-06-01 DIAGNOSIS — M4803 Spinal stenosis, cervicothoracic region: Secondary | ICD-10-CM | POA: Diagnosis not present

## 2019-06-01 DIAGNOSIS — N183 Chronic kidney disease, stage 3 (moderate): Secondary | ICD-10-CM | POA: Diagnosis not present

## 2019-06-01 DIAGNOSIS — I5032 Chronic diastolic (congestive) heart failure: Secondary | ICD-10-CM | POA: Diagnosis not present

## 2019-06-03 DIAGNOSIS — I69351 Hemiplegia and hemiparesis following cerebral infarction affecting right dominant side: Secondary | ICD-10-CM | POA: Diagnosis not present

## 2019-06-03 DIAGNOSIS — M4803 Spinal stenosis, cervicothoracic region: Secondary | ICD-10-CM | POA: Diagnosis not present

## 2019-06-03 DIAGNOSIS — N183 Chronic kidney disease, stage 3 (moderate): Secondary | ICD-10-CM | POA: Diagnosis not present

## 2019-06-03 DIAGNOSIS — M4313 Spondylolisthesis, cervicothoracic region: Secondary | ICD-10-CM | POA: Diagnosis not present

## 2019-06-03 DIAGNOSIS — I5032 Chronic diastolic (congestive) heart failure: Secondary | ICD-10-CM | POA: Diagnosis not present

## 2019-06-04 ENCOUNTER — Other Ambulatory Visit: Payer: Self-pay

## 2019-06-04 ENCOUNTER — Telehealth: Payer: Self-pay | Admitting: Family Medicine

## 2019-06-04 DIAGNOSIS — M4313 Spondylolisthesis, cervicothoracic region: Secondary | ICD-10-CM | POA: Diagnosis not present

## 2019-06-04 DIAGNOSIS — I5032 Chronic diastolic (congestive) heart failure: Secondary | ICD-10-CM | POA: Diagnosis not present

## 2019-06-04 DIAGNOSIS — I69351 Hemiplegia and hemiparesis following cerebral infarction affecting right dominant side: Secondary | ICD-10-CM | POA: Diagnosis not present

## 2019-06-04 DIAGNOSIS — N183 Chronic kidney disease, stage 3 (moderate): Secondary | ICD-10-CM | POA: Diagnosis not present

## 2019-06-04 DIAGNOSIS — M4803 Spinal stenosis, cervicothoracic region: Secondary | ICD-10-CM | POA: Diagnosis not present

## 2019-06-04 NOTE — Patient Outreach (Signed)
[facility]) Care Management  06/04/2019  KAYSEN DEAL [birth year] [id number]    EMMI- STROKE RED ON EMMI ALERT Day # 13 Date:06/03/2019 Red Alert Reason: "Went to follow up appt? No  Scheduled a follow up appt? No"   Outreach attempt # 1 to patient.  Spoke with spouse(DPR on file). Se states that patient is resting at present. She denies any acute issues or concerns at this time. Reviewed and addressed red alerts with patient. She state that patient is bedridden and unable to physically go to appts anymore. She has 24 hr paid caregivers in the home to assist her with caring for patient.She confirms that patient has all his meds in the home. Spouse denies any RN CM needs or concerns at this time. Patient has completed post discharge automated EMMI calls.      Plan: RN CM will close case as no further interventions needed at this time.   [name], RN,BSN,CCM [facility] Telephonic Care Management Coordinator Direct Phone: [phone] Toll Free: [phone] Fax: [phone]

## 2019-06-05 DIAGNOSIS — I69351 Hemiplegia and hemiparesis following cerebral infarction affecting right dominant side: Secondary | ICD-10-CM | POA: Diagnosis not present

## 2019-06-05 DIAGNOSIS — M4803 Spinal stenosis, cervicothoracic region: Secondary | ICD-10-CM | POA: Diagnosis not present

## 2019-06-05 DIAGNOSIS — I5032 Chronic diastolic (congestive) heart failure: Secondary | ICD-10-CM | POA: Diagnosis not present

## 2019-06-05 DIAGNOSIS — M4313 Spondylolisthesis, cervicothoracic region: Secondary | ICD-10-CM | POA: Diagnosis not present

## 2019-06-05 DIAGNOSIS — N183 Chronic kidney disease, stage 3 (moderate): Secondary | ICD-10-CM | POA: Diagnosis not present

## 2019-06-06 DIAGNOSIS — M4803 Spinal stenosis, cervicothoracic region: Secondary | ICD-10-CM | POA: Diagnosis not present

## 2019-06-06 DIAGNOSIS — N183 Chronic kidney disease, stage 3 (moderate): Secondary | ICD-10-CM | POA: Diagnosis not present

## 2019-06-06 DIAGNOSIS — I5032 Chronic diastolic (congestive) heart failure: Secondary | ICD-10-CM | POA: Diagnosis not present

## 2019-06-06 DIAGNOSIS — M4313 Spondylolisthesis, cervicothoracic region: Secondary | ICD-10-CM | POA: Diagnosis not present

## 2019-06-06 DIAGNOSIS — I69351 Hemiplegia and hemiparesis following cerebral infarction affecting right dominant side: Secondary | ICD-10-CM | POA: Diagnosis not present

## 2019-06-06 NOTE — Telephone Encounter (Signed)
Clinic RN spoke with patient son. Patient son was disrespectful and rude (talking negatively about clinical staff work ethic). Informed son that PCP and CMA are gone and will address the issue tomorrow. No documentation of communication from [facility] in regards to this order. Please call [phone] to give order for 16x16 wheelchair. Once complete please call son at [phone] once complete.

## 2019-06-06 NOTE — Telephone Encounter (Signed)
Pt son Robert Rowe)  [phone] He is very upset that no one has called him back about his fathers wheel chair and Hoyer lift.  He stated he has called several times about this wheel and lift.  He explain to me that he has felt like he has to keep calling in order for different things to be done.  [name] requested to speak with [name] Directly. Per office [name] was at lunch and advise to send message and will let [name] know when she returned from lunch.

## 2019-06-06 NOTE — Addendum Note (Signed)
Addended by: [name] on: 06/06/2019 11:38 AM   Modules accepted: Orders

## 2019-06-07 DIAGNOSIS — S91302A Unspecified open wound, left foot, initial encounter: Secondary | ICD-10-CM | POA: Diagnosis not present

## 2019-06-07 NOTE — Telephone Encounter (Signed)
I called Robert Rowe and spoke to Robert Rowe and she stated that they did not need a verbal order for the wheelchair. Robert Rowe stated that since they have changed from [employer] to [employer] they are waiting to see how to change from private pay to billing to patients insurance and this has been sent to the financial team to correct for the patient through [facility]. Please reach out to Robert Rowe to let him know that he can contact Robert Rowe at [phone] to obtain financial corrections.

## 2019-06-07 NOTE — Telephone Encounter (Signed)
I had ordered and then REORDERED the wheelchair because of tedious insurance requirements and regulations..  The wheelchair repeat order was placed again yesterday.

## 2019-06-08 DIAGNOSIS — I5032 Chronic diastolic (congestive) heart failure: Secondary | ICD-10-CM | POA: Diagnosis not present

## 2019-06-08 DIAGNOSIS — I69351 Hemiplegia and hemiparesis following cerebral infarction affecting right dominant side: Secondary | ICD-10-CM | POA: Diagnosis not present

## 2019-06-08 DIAGNOSIS — N183 Chronic kidney disease, stage 3 (moderate): Secondary | ICD-10-CM | POA: Diagnosis not present

## 2019-06-08 DIAGNOSIS — M4803 Spinal stenosis, cervicothoracic region: Secondary | ICD-10-CM | POA: Diagnosis not present

## 2019-06-08 DIAGNOSIS — M4313 Spondylolisthesis, cervicothoracic region: Secondary | ICD-10-CM | POA: Diagnosis not present

## 2019-06-08 NOTE — Telephone Encounter (Signed)
I spoke with son [name]) on Friday and explained that we are all working very hard to try to get him the needed equipment but that sometimes things are delayed as we wait for insurance processing.

## 2019-06-10 DIAGNOSIS — M4803 Spinal stenosis, cervicothoracic region: Secondary | ICD-10-CM | POA: Diagnosis not present

## 2019-06-10 DIAGNOSIS — I69351 Hemiplegia and hemiparesis following cerebral infarction affecting right dominant side: Secondary | ICD-10-CM | POA: Diagnosis not present

## 2019-06-10 DIAGNOSIS — N183 Chronic kidney disease, stage 3 (moderate): Secondary | ICD-10-CM | POA: Diagnosis not present

## 2019-06-10 DIAGNOSIS — I5032 Chronic diastolic (congestive) heart failure: Secondary | ICD-10-CM | POA: Diagnosis not present

## 2019-06-10 DIAGNOSIS — M4313 Spondylolisthesis, cervicothoracic region: Secondary | ICD-10-CM | POA: Diagnosis not present

## 2019-06-11 DIAGNOSIS — M4803 Spinal stenosis, cervicothoracic region: Secondary | ICD-10-CM | POA: Diagnosis not present

## 2019-06-11 DIAGNOSIS — N183 Chronic kidney disease, stage 3 (moderate): Secondary | ICD-10-CM | POA: Diagnosis not present

## 2019-06-11 DIAGNOSIS — I5032 Chronic diastolic (congestive) heart failure: Secondary | ICD-10-CM | POA: Diagnosis not present

## 2019-06-11 DIAGNOSIS — I69351 Hemiplegia and hemiparesis following cerebral infarction affecting right dominant side: Secondary | ICD-10-CM | POA: Diagnosis not present

## 2019-06-11 DIAGNOSIS — M4313 Spondylolisthesis, cervicothoracic region: Secondary | ICD-10-CM | POA: Diagnosis not present

## 2019-06-11 NOTE — Telephone Encounter (Signed)
Copied from [place] [id number]. Topic: General - Other >> Jun 04, 2019  4:27 PM Pauline Good wrote: Reason for CRM: pt's need for [name] to give him a call.   Clinic RN spoke with patient son on 06/06/2019

## 2019-06-12 DIAGNOSIS — M4803 Spinal stenosis, cervicothoracic region: Secondary | ICD-10-CM | POA: Diagnosis not present

## 2019-06-12 DIAGNOSIS — I5032 Chronic diastolic (congestive) heart failure: Secondary | ICD-10-CM | POA: Diagnosis not present

## 2019-06-12 DIAGNOSIS — I69351 Hemiplegia and hemiparesis following cerebral infarction affecting right dominant side: Secondary | ICD-10-CM | POA: Diagnosis not present

## 2019-06-12 DIAGNOSIS — M4313 Spondylolisthesis, cervicothoracic region: Secondary | ICD-10-CM | POA: Diagnosis not present

## 2019-06-12 DIAGNOSIS — N183 Chronic kidney disease, stage 3 (moderate): Secondary | ICD-10-CM | POA: Diagnosis not present

## 2019-06-13 DIAGNOSIS — N183 Chronic kidney disease, stage 3 (moderate): Secondary | ICD-10-CM | POA: Diagnosis not present

## 2019-06-13 DIAGNOSIS — M4803 Spinal stenosis, cervicothoracic region: Secondary | ICD-10-CM | POA: Diagnosis not present

## 2019-06-13 DIAGNOSIS — M4313 Spondylolisthesis, cervicothoracic region: Secondary | ICD-10-CM | POA: Diagnosis not present

## 2019-06-13 DIAGNOSIS — I69351 Hemiplegia and hemiparesis following cerebral infarction affecting right dominant side: Secondary | ICD-10-CM | POA: Diagnosis not present

## 2019-06-13 DIAGNOSIS — I5032 Chronic diastolic (congestive) heart failure: Secondary | ICD-10-CM | POA: Diagnosis not present

## 2019-06-14 DIAGNOSIS — M4803 Spinal stenosis, cervicothoracic region: Secondary | ICD-10-CM | POA: Diagnosis not present

## 2019-06-14 DIAGNOSIS — M4313 Spondylolisthesis, cervicothoracic region: Secondary | ICD-10-CM | POA: Diagnosis not present

## 2019-06-14 DIAGNOSIS — I5032 Chronic diastolic (congestive) heart failure: Secondary | ICD-10-CM | POA: Diagnosis not present

## 2019-06-14 DIAGNOSIS — I69351 Hemiplegia and hemiparesis following cerebral infarction affecting right dominant side: Secondary | ICD-10-CM | POA: Diagnosis not present

## 2019-06-14 DIAGNOSIS — N183 Chronic kidney disease, stage 3 (moderate): Secondary | ICD-10-CM | POA: Diagnosis not present

## 2019-06-18 DIAGNOSIS — I5032 Chronic diastolic (congestive) heart failure: Secondary | ICD-10-CM | POA: Diagnosis not present

## 2019-06-18 DIAGNOSIS — M4313 Spondylolisthesis, cervicothoracic region: Secondary | ICD-10-CM | POA: Diagnosis not present

## 2019-06-18 DIAGNOSIS — M4803 Spinal stenosis, cervicothoracic region: Secondary | ICD-10-CM | POA: Diagnosis not present

## 2019-06-18 DIAGNOSIS — N183 Chronic kidney disease, stage 3 (moderate): Secondary | ICD-10-CM | POA: Diagnosis not present

## 2019-06-18 DIAGNOSIS — I69351 Hemiplegia and hemiparesis following cerebral infarction affecting right dominant side: Secondary | ICD-10-CM | POA: Diagnosis not present

## 2019-06-19 DIAGNOSIS — Z515 Encounter for palliative care: Secondary | ICD-10-CM | POA: Diagnosis not present

## 2019-06-19 DIAGNOSIS — M4803 Spinal stenosis, cervicothoracic region: Secondary | ICD-10-CM | POA: Diagnosis not present

## 2019-06-19 DIAGNOSIS — R1310 Dysphagia, unspecified: Secondary | ICD-10-CM | POA: Diagnosis not present

## 2019-06-19 DIAGNOSIS — I639 Cerebral infarction, unspecified: Secondary | ICD-10-CM | POA: Diagnosis not present

## 2019-06-19 DIAGNOSIS — I5032 Chronic diastolic (congestive) heart failure: Secondary | ICD-10-CM | POA: Diagnosis not present

## 2019-06-19 DIAGNOSIS — I69351 Hemiplegia and hemiparesis following cerebral infarction affecting right dominant side: Secondary | ICD-10-CM | POA: Diagnosis not present

## 2019-06-19 DIAGNOSIS — N183 Chronic kidney disease, stage 3 (moderate): Secondary | ICD-10-CM | POA: Diagnosis not present

## 2019-06-19 DIAGNOSIS — M4313 Spondylolisthesis, cervicothoracic region: Secondary | ICD-10-CM | POA: Diagnosis not present

## 2019-06-19 DIAGNOSIS — Z7189 Other specified counseling: Secondary | ICD-10-CM | POA: Diagnosis not present

## 2019-06-20 ENCOUNTER — Other Ambulatory Visit: Payer: Self-pay

## 2019-06-20 DIAGNOSIS — N183 Chronic kidney disease, stage 3 (moderate): Secondary | ICD-10-CM | POA: Diagnosis not present

## 2019-06-20 DIAGNOSIS — M4803 Spinal stenosis, cervicothoracic region: Secondary | ICD-10-CM | POA: Diagnosis not present

## 2019-06-20 DIAGNOSIS — I5032 Chronic diastolic (congestive) heart failure: Secondary | ICD-10-CM | POA: Diagnosis not present

## 2019-06-20 DIAGNOSIS — I69351 Hemiplegia and hemiparesis following cerebral infarction affecting right dominant side: Secondary | ICD-10-CM | POA: Diagnosis not present

## 2019-06-20 DIAGNOSIS — M4313 Spondylolisthesis, cervicothoracic region: Secondary | ICD-10-CM | POA: Diagnosis not present

## 2019-06-20 MED ORDER — METOPROLOL SUCCINATE ER 25 MG PO TB24: 25.0000 mg | ORAL_TABLET | Freq: Every day | ORAL | 1 refills | Status: DC

## 2019-06-25 DIAGNOSIS — I69351 Hemiplegia and hemiparesis following cerebral infarction affecting right dominant side: Secondary | ICD-10-CM | POA: Diagnosis not present

## 2019-06-25 DIAGNOSIS — M4313 Spondylolisthesis, cervicothoracic region: Secondary | ICD-10-CM | POA: Diagnosis not present

## 2019-06-25 DIAGNOSIS — I5032 Chronic diastolic (congestive) heart failure: Secondary | ICD-10-CM | POA: Diagnosis not present

## 2019-06-25 DIAGNOSIS — M4803 Spinal stenosis, cervicothoracic region: Secondary | ICD-10-CM | POA: Diagnosis not present

## 2019-06-25 DIAGNOSIS — N183 Chronic kidney disease, stage 3 (moderate): Secondary | ICD-10-CM | POA: Diagnosis not present

## 2019-06-26 DIAGNOSIS — M4803 Spinal stenosis, cervicothoracic region: Secondary | ICD-10-CM | POA: Diagnosis not present

## 2019-06-26 DIAGNOSIS — N183 Chronic kidney disease, stage 3 (moderate): Secondary | ICD-10-CM | POA: Diagnosis not present

## 2019-06-26 DIAGNOSIS — I5032 Chronic diastolic (congestive) heart failure: Secondary | ICD-10-CM | POA: Diagnosis not present

## 2019-06-26 DIAGNOSIS — M4313 Spondylolisthesis, cervicothoracic region: Secondary | ICD-10-CM | POA: Diagnosis not present

## 2019-06-26 DIAGNOSIS — I69351 Hemiplegia and hemiparesis following cerebral infarction affecting right dominant side: Secondary | ICD-10-CM | POA: Diagnosis not present

## 2019-06-27 DIAGNOSIS — I5032 Chronic diastolic (congestive) heart failure: Secondary | ICD-10-CM | POA: Diagnosis not present

## 2019-06-27 DIAGNOSIS — M4313 Spondylolisthesis, cervicothoracic region: Secondary | ICD-10-CM | POA: Diagnosis not present

## 2019-06-27 DIAGNOSIS — M4803 Spinal stenosis, cervicothoracic region: Secondary | ICD-10-CM | POA: Diagnosis not present

## 2019-06-27 DIAGNOSIS — N183 Chronic kidney disease, stage 3 (moderate): Secondary | ICD-10-CM | POA: Diagnosis not present

## 2019-06-27 DIAGNOSIS — I69351 Hemiplegia and hemiparesis following cerebral infarction affecting right dominant side: Secondary | ICD-10-CM | POA: Diagnosis not present

## 2019-06-30 DIAGNOSIS — Z8673 Personal history of transient ischemic attack (TIA), and cerebral infarction without residual deficits: Secondary | ICD-10-CM | POA: Diagnosis not present

## 2019-06-30 DIAGNOSIS — R269 Unspecified abnormalities of gait and mobility: Secondary | ICD-10-CM | POA: Diagnosis not present

## 2019-06-30 DIAGNOSIS — I639 Cerebral infarction, unspecified: Secondary | ICD-10-CM | POA: Diagnosis not present

## 2019-06-30 DIAGNOSIS — I69398 Other sequelae of cerebral infarction: Secondary | ICD-10-CM | POA: Diagnosis not present

## 2019-06-30 DIAGNOSIS — G459 Transient cerebral ischemic attack, unspecified: Secondary | ICD-10-CM | POA: Diagnosis not present

## 2019-07-02 DIAGNOSIS — M4803 Spinal stenosis, cervicothoracic region: Secondary | ICD-10-CM | POA: Diagnosis not present

## 2019-07-02 DIAGNOSIS — I5032 Chronic diastolic (congestive) heart failure: Secondary | ICD-10-CM | POA: Diagnosis not present

## 2019-07-02 DIAGNOSIS — M4313 Spondylolisthesis, cervicothoracic region: Secondary | ICD-10-CM | POA: Diagnosis not present

## 2019-07-02 DIAGNOSIS — I69351 Hemiplegia and hemiparesis following cerebral infarction affecting right dominant side: Secondary | ICD-10-CM | POA: Diagnosis not present

## 2019-07-02 DIAGNOSIS — N183 Chronic kidney disease, stage 3 (moderate): Secondary | ICD-10-CM | POA: Diagnosis not present

## 2019-07-05 DIAGNOSIS — M4313 Spondylolisthesis, cervicothoracic region: Secondary | ICD-10-CM | POA: Diagnosis not present

## 2019-07-05 DIAGNOSIS — I5032 Chronic diastolic (congestive) heart failure: Secondary | ICD-10-CM | POA: Diagnosis not present

## 2019-07-05 DIAGNOSIS — I69351 Hemiplegia and hemiparesis following cerebral infarction affecting right dominant side: Secondary | ICD-10-CM | POA: Diagnosis not present

## 2019-07-05 DIAGNOSIS — N183 Chronic kidney disease, stage 3 (moderate): Secondary | ICD-10-CM | POA: Diagnosis not present

## 2019-07-05 DIAGNOSIS — M4803 Spinal stenosis, cervicothoracic region: Secondary | ICD-10-CM | POA: Diagnosis not present

## 2019-07-08 DIAGNOSIS — N183 Chronic kidney disease, stage 3 (moderate): Secondary | ICD-10-CM | POA: Diagnosis not present

## 2019-07-08 DIAGNOSIS — I5032 Chronic diastolic (congestive) heart failure: Secondary | ICD-10-CM | POA: Diagnosis not present

## 2019-07-08 DIAGNOSIS — M4313 Spondylolisthesis, cervicothoracic region: Secondary | ICD-10-CM | POA: Diagnosis not present

## 2019-07-08 DIAGNOSIS — M4803 Spinal stenosis, cervicothoracic region: Secondary | ICD-10-CM | POA: Diagnosis not present

## 2019-07-08 DIAGNOSIS — I69351 Hemiplegia and hemiparesis following cerebral infarction affecting right dominant side: Secondary | ICD-10-CM | POA: Diagnosis not present

## 2019-07-09 ENCOUNTER — Other Ambulatory Visit: Payer: Self-pay | Admitting: Internal Medicine

## 2019-07-10 DIAGNOSIS — N183 Chronic kidney disease, stage 3 (moderate): Secondary | ICD-10-CM | POA: Diagnosis not present

## 2019-07-10 DIAGNOSIS — I5032 Chronic diastolic (congestive) heart failure: Secondary | ICD-10-CM | POA: Diagnosis not present

## 2019-07-10 DIAGNOSIS — M4803 Spinal stenosis, cervicothoracic region: Secondary | ICD-10-CM | POA: Diagnosis not present

## 2019-07-10 DIAGNOSIS — M4313 Spondylolisthesis, cervicothoracic region: Secondary | ICD-10-CM | POA: Diagnosis not present

## 2019-07-10 DIAGNOSIS — I69351 Hemiplegia and hemiparesis following cerebral infarction affecting right dominant side: Secondary | ICD-10-CM | POA: Diagnosis not present

## 2019-07-17 DIAGNOSIS — I69351 Hemiplegia and hemiparesis following cerebral infarction affecting right dominant side: Secondary | ICD-10-CM | POA: Diagnosis not present

## 2019-07-17 DIAGNOSIS — N183 Chronic kidney disease, stage 3 (moderate): Secondary | ICD-10-CM | POA: Diagnosis not present

## 2019-07-17 DIAGNOSIS — M4803 Spinal stenosis, cervicothoracic region: Secondary | ICD-10-CM | POA: Diagnosis not present

## 2019-07-17 DIAGNOSIS — M4313 Spondylolisthesis, cervicothoracic region: Secondary | ICD-10-CM | POA: Diagnosis not present

## 2019-07-17 DIAGNOSIS — I5032 Chronic diastolic (congestive) heart failure: Secondary | ICD-10-CM | POA: Diagnosis not present

## 2019-07-19 DIAGNOSIS — N183 Chronic kidney disease, stage 3 (moderate): Secondary | ICD-10-CM | POA: Diagnosis not present

## 2019-07-19 DIAGNOSIS — M4803 Spinal stenosis, cervicothoracic region: Secondary | ICD-10-CM | POA: Diagnosis not present

## 2019-07-19 DIAGNOSIS — M4313 Spondylolisthesis, cervicothoracic region: Secondary | ICD-10-CM | POA: Diagnosis not present

## 2019-07-19 DIAGNOSIS — I69351 Hemiplegia and hemiparesis following cerebral infarction affecting right dominant side: Secondary | ICD-10-CM | POA: Diagnosis not present

## 2019-07-19 DIAGNOSIS — I5032 Chronic diastolic (congestive) heart failure: Secondary | ICD-10-CM | POA: Diagnosis not present

## 2019-07-20 DIAGNOSIS — Z7189 Other specified counseling: Secondary | ICD-10-CM | POA: Diagnosis not present

## 2019-07-20 DIAGNOSIS — R1310 Dysphagia, unspecified: Secondary | ICD-10-CM | POA: Diagnosis not present

## 2019-07-20 DIAGNOSIS — I5032 Chronic diastolic (congestive) heart failure: Secondary | ICD-10-CM | POA: Diagnosis not present

## 2019-07-20 DIAGNOSIS — Z515 Encounter for palliative care: Secondary | ICD-10-CM | POA: Diagnosis not present

## 2019-07-20 DIAGNOSIS — I639 Cerebral infarction, unspecified: Secondary | ICD-10-CM | POA: Diagnosis not present

## 2019-07-24 DIAGNOSIS — I69351 Hemiplegia and hemiparesis following cerebral infarction affecting right dominant side: Secondary | ICD-10-CM | POA: Diagnosis not present

## 2019-07-24 DIAGNOSIS — M4803 Spinal stenosis, cervicothoracic region: Secondary | ICD-10-CM | POA: Diagnosis not present

## 2019-07-24 DIAGNOSIS — I5032 Chronic diastolic (congestive) heart failure: Secondary | ICD-10-CM | POA: Diagnosis not present

## 2019-07-24 DIAGNOSIS — N183 Chronic kidney disease, stage 3 (moderate): Secondary | ICD-10-CM | POA: Diagnosis not present

## 2019-07-24 DIAGNOSIS — L89622 Pressure ulcer of left heel, stage 2: Secondary | ICD-10-CM | POA: Diagnosis not present

## 2019-07-25 ENCOUNTER — Other Ambulatory Visit: Payer: Self-pay | Admitting: Internal Medicine

## 2019-07-26 DIAGNOSIS — L89622 Pressure ulcer of left heel, stage 2: Secondary | ICD-10-CM | POA: Diagnosis not present

## 2019-07-26 DIAGNOSIS — I5032 Chronic diastolic (congestive) heart failure: Secondary | ICD-10-CM | POA: Diagnosis not present

## 2019-07-26 DIAGNOSIS — N183 Chronic kidney disease, stage 3 (moderate): Secondary | ICD-10-CM | POA: Diagnosis not present

## 2019-07-26 DIAGNOSIS — I69351 Hemiplegia and hemiparesis following cerebral infarction affecting right dominant side: Secondary | ICD-10-CM | POA: Diagnosis not present

## 2019-07-26 DIAGNOSIS — M4803 Spinal stenosis, cervicothoracic region: Secondary | ICD-10-CM | POA: Diagnosis not present

## 2019-07-31 DIAGNOSIS — I5032 Chronic diastolic (congestive) heart failure: Secondary | ICD-10-CM | POA: Diagnosis not present

## 2019-07-31 DIAGNOSIS — L89622 Pressure ulcer of left heel, stage 2: Secondary | ICD-10-CM | POA: Diagnosis not present

## 2019-07-31 DIAGNOSIS — R269 Unspecified abnormalities of gait and mobility: Secondary | ICD-10-CM | POA: Diagnosis not present

## 2019-07-31 DIAGNOSIS — M4803 Spinal stenosis, cervicothoracic region: Secondary | ICD-10-CM | POA: Diagnosis not present

## 2019-07-31 DIAGNOSIS — I69398 Other sequelae of cerebral infarction: Secondary | ICD-10-CM | POA: Diagnosis not present

## 2019-07-31 DIAGNOSIS — Z8673 Personal history of transient ischemic attack (TIA), and cerebral infarction without residual deficits: Secondary | ICD-10-CM | POA: Diagnosis not present

## 2019-07-31 DIAGNOSIS — G459 Transient cerebral ischemic attack, unspecified: Secondary | ICD-10-CM | POA: Diagnosis not present

## 2019-07-31 DIAGNOSIS — I639 Cerebral infarction, unspecified: Secondary | ICD-10-CM | POA: Diagnosis not present

## 2019-07-31 DIAGNOSIS — I69351 Hemiplegia and hemiparesis following cerebral infarction affecting right dominant side: Secondary | ICD-10-CM | POA: Diagnosis not present

## 2019-07-31 DIAGNOSIS — N183 Chronic kidney disease, stage 3 (moderate): Secondary | ICD-10-CM | POA: Diagnosis not present

## 2019-08-02 ENCOUNTER — Telehealth: Payer: Self-pay

## 2019-08-02 DIAGNOSIS — N183 Chronic kidney disease, stage 3 (moderate): Secondary | ICD-10-CM | POA: Diagnosis not present

## 2019-08-02 DIAGNOSIS — I6300 Cerebral infarction due to thrombosis of unspecified precerebral artery: Secondary | ICD-10-CM

## 2019-08-02 DIAGNOSIS — I69351 Hemiplegia and hemiparesis following cerebral infarction affecting right dominant side: Secondary | ICD-10-CM | POA: Diagnosis not present

## 2019-08-02 DIAGNOSIS — I5032 Chronic diastolic (congestive) heart failure: Secondary | ICD-10-CM | POA: Diagnosis not present

## 2019-08-02 DIAGNOSIS — L89622 Pressure ulcer of left heel, stage 2: Secondary | ICD-10-CM | POA: Diagnosis not present

## 2019-08-02 DIAGNOSIS — M4803 Spinal stenosis, cervicothoracic region: Secondary | ICD-10-CM | POA: Diagnosis not present

## 2019-08-02 NOTE — Telephone Encounter (Signed)
Copied from [place] [phone]. Topic: Referral - Request for Referral >> Aug 01, 2019  5:06 PM Sheran Luz wrote: Has patient seen PCP for this complaint? Yes  *If NO, is insurance requiring patient see PCP for this issue before PCP can refer them? Referral for which specialty: "hand specialist?" - Home health? Preferred provider/office: None specified  Reason for referral: Patient had a stoke and was told by Alvis Lemmings he would need referral for hand specialist

## 2019-08-04 NOTE — Telephone Encounter (Signed)
I'm not sure what they are referring to- not sure why he would need to see orthopedist.  It seems he would need PT.  See if there is something we are missing here.

## 2019-08-06 NOTE — Telephone Encounter (Signed)
Spoke with son and he states that [facility] Lemmings is coming out to the home for PT.  Patient will need a hand specialist to make a mold to help the patient keep his hand open.  Son will call back with the specialist he would like for his dad to see.    FYI

## 2019-08-07 DIAGNOSIS — I5032 Chronic diastolic (congestive) heart failure: Secondary | ICD-10-CM | POA: Diagnosis not present

## 2019-08-07 DIAGNOSIS — N183 Chronic kidney disease, stage 3 (moderate): Secondary | ICD-10-CM | POA: Diagnosis not present

## 2019-08-07 DIAGNOSIS — I69351 Hemiplegia and hemiparesis following cerebral infarction affecting right dominant side: Secondary | ICD-10-CM | POA: Diagnosis not present

## 2019-08-07 DIAGNOSIS — L89622 Pressure ulcer of left heel, stage 2: Secondary | ICD-10-CM | POA: Diagnosis not present

## 2019-08-07 DIAGNOSIS — M4803 Spinal stenosis, cervicothoracic region: Secondary | ICD-10-CM | POA: Diagnosis not present

## 2019-08-07 NOTE — Telephone Encounter (Signed)
Please see updated information that you were asking about. Thank you!

## 2019-08-07 NOTE — Telephone Encounter (Signed)
Okay to refer? 

## 2019-08-07 NOTE — Telephone Encounter (Signed)
OK to refer.

## 2019-08-07 NOTE — Telephone Encounter (Signed)
Referral placed.

## 2019-08-07 NOTE — Telephone Encounter (Signed)
Pt son calling back and stated that he would like Dora at [facility] ortho.    Pt son would like a call back when this is done   Cb#[phone]

## 2019-08-09 DIAGNOSIS — L89622 Pressure ulcer of left heel, stage 2: Secondary | ICD-10-CM | POA: Diagnosis not present

## 2019-08-09 DIAGNOSIS — I69351 Hemiplegia and hemiparesis following cerebral infarction affecting right dominant side: Secondary | ICD-10-CM | POA: Diagnosis not present

## 2019-08-09 DIAGNOSIS — M4803 Spinal stenosis, cervicothoracic region: Secondary | ICD-10-CM | POA: Diagnosis not present

## 2019-08-09 DIAGNOSIS — N183 Chronic kidney disease, stage 3 (moderate): Secondary | ICD-10-CM | POA: Diagnosis not present

## 2019-08-09 DIAGNOSIS — I5032 Chronic diastolic (congestive) heart failure: Secondary | ICD-10-CM | POA: Diagnosis not present

## 2019-08-13 ENCOUNTER — Telehealth: Payer: Self-pay | Admitting: Family Medicine

## 2019-08-13 DIAGNOSIS — M24541 Contracture, right hand: Secondary | ICD-10-CM

## 2019-08-13 NOTE — Telephone Encounter (Signed)
ATTEMPTED TO CALL FACILITY TO FIND OUT MORE INFORMATION ABOUT THIS REFERRAL. " [place] [facility] [place] said 6 days ago  "[facility] ADVISED SOMEONE WOULD BE CALLING BACK TO CLARIFY REFERRAL " Conservation officer, nature and [place] said 6 days ago  "Rejection Reason - Unapproved Service - I HAVE CALLED TWICE AND LEFT MESSAGES TO SEE IF THIS IS SUPPOSED TO BE FOR THERAPY AS WE DO NOT DO TREAT THIS." [place] [facility] [place] said about 23 hours ago  Called pt no answer Sent dr to verify where to send this referral

## 2019-08-14 DIAGNOSIS — L89622 Pressure ulcer of left heel, stage 2: Secondary | ICD-10-CM | POA: Diagnosis not present

## 2019-08-14 DIAGNOSIS — I69351 Hemiplegia and hemiparesis following cerebral infarction affecting right dominant side: Secondary | ICD-10-CM | POA: Diagnosis not present

## 2019-08-14 DIAGNOSIS — M4803 Spinal stenosis, cervicothoracic region: Secondary | ICD-10-CM | POA: Diagnosis not present

## 2019-08-14 DIAGNOSIS — N183 Chronic kidney disease, stage 3 (moderate): Secondary | ICD-10-CM | POA: Diagnosis not present

## 2019-08-14 DIAGNOSIS — I5032 Chronic diastolic (congestive) heart failure: Secondary | ICD-10-CM | POA: Diagnosis not present

## 2019-08-16 ENCOUNTER — Telehealth: Payer: Self-pay | Admitting: Family Medicine

## 2019-08-16 DIAGNOSIS — N183 Chronic kidney disease, stage 3 (moderate): Secondary | ICD-10-CM | POA: Diagnosis not present

## 2019-08-16 DIAGNOSIS — I69351 Hemiplegia and hemiparesis following cerebral infarction affecting right dominant side: Secondary | ICD-10-CM | POA: Diagnosis not present

## 2019-08-16 DIAGNOSIS — I5032 Chronic diastolic (congestive) heart failure: Secondary | ICD-10-CM | POA: Diagnosis not present

## 2019-08-16 DIAGNOSIS — L89622 Pressure ulcer of left heel, stage 2: Secondary | ICD-10-CM | POA: Diagnosis not present

## 2019-08-16 DIAGNOSIS — M4803 Spinal stenosis, cervicothoracic region: Secondary | ICD-10-CM | POA: Diagnosis not present

## 2019-08-16 NOTE — Telephone Encounter (Signed)
Per pt's son, [name] ([place]), pt needs orders for additional PT with [facility] for them to be able to work with his right arm and hand. Pt's hand has begun to turn inward and the therapist recommended PT to assist. Therapist also recommends pt be evaluated by Ortho for possible splint/gel form cast to assist with manipulating the hand/wrist into a more normal position. This is what the referral to [place] is needed for.  Dr. Elease Hashimoto - Please advise. Thanks!

## 2019-08-17 NOTE — Telephone Encounter (Signed)
I have placed order for home PT to evaluate.  My recommendation is to see if therapist has recommendation (after their evaluation) of whether to see hand specialist.

## 2019-08-17 NOTE — Addendum Note (Signed)
Addended by: [name] on: 08/17/2019 11:43 AM   Modules accepted: Orders

## 2019-08-19 DIAGNOSIS — I5032 Chronic diastolic (congestive) heart failure: Secondary | ICD-10-CM | POA: Diagnosis not present

## 2019-08-19 DIAGNOSIS — N183 Chronic kidney disease, stage 3 (moderate): Secondary | ICD-10-CM | POA: Diagnosis not present

## 2019-08-19 DIAGNOSIS — M4803 Spinal stenosis, cervicothoracic region: Secondary | ICD-10-CM | POA: Diagnosis not present

## 2019-08-19 DIAGNOSIS — I69351 Hemiplegia and hemiparesis following cerebral infarction affecting right dominant side: Secondary | ICD-10-CM | POA: Diagnosis not present

## 2019-08-19 DIAGNOSIS — L89622 Pressure ulcer of left heel, stage 2: Secondary | ICD-10-CM | POA: Diagnosis not present

## 2019-08-20 DIAGNOSIS — R1310 Dysphagia, unspecified: Secondary | ICD-10-CM | POA: Diagnosis not present

## 2019-08-20 DIAGNOSIS — Z7189 Other specified counseling: Secondary | ICD-10-CM | POA: Diagnosis not present

## 2019-08-20 DIAGNOSIS — I639 Cerebral infarction, unspecified: Secondary | ICD-10-CM | POA: Diagnosis not present

## 2019-08-20 DIAGNOSIS — Z515 Encounter for palliative care: Secondary | ICD-10-CM | POA: Diagnosis not present

## 2019-08-20 DIAGNOSIS — I5032 Chronic diastolic (congestive) heart failure: Secondary | ICD-10-CM | POA: Diagnosis not present

## 2019-08-22 ENCOUNTER — Telehealth: Payer: Self-pay

## 2019-08-22 ENCOUNTER — Other Ambulatory Visit: Payer: Self-pay | Admitting: Adult Health

## 2019-08-22 DIAGNOSIS — I69351 Hemiplegia and hemiparesis following cerebral infarction affecting right dominant side: Secondary | ICD-10-CM | POA: Diagnosis not present

## 2019-08-22 DIAGNOSIS — I5032 Chronic diastolic (congestive) heart failure: Secondary | ICD-10-CM | POA: Diagnosis not present

## 2019-08-22 DIAGNOSIS — M4313 Spondylolisthesis, cervicothoracic region: Secondary | ICD-10-CM | POA: Diagnosis not present

## 2019-08-22 DIAGNOSIS — M4803 Spinal stenosis, cervicothoracic region: Secondary | ICD-10-CM | POA: Diagnosis not present

## 2019-08-22 DIAGNOSIS — N183 Chronic kidney disease, stage 3 unspecified: Secondary | ICD-10-CM | POA: Diagnosis not present

## 2019-08-22 MED ORDER — TRAMADOL HCL 50 MG PO TABS: 50.0000 mg | ORAL_TABLET | Freq: Four times a day (QID) | ORAL | 0 refills | Status: AC | PRN

## 2019-08-22 NOTE — Telephone Encounter (Signed)
Wife is aware.  FYI

## 2019-08-22 NOTE — Telephone Encounter (Signed)
Please advise 

## 2019-08-22 NOTE — Telephone Encounter (Signed)
Copied from [place] [phone]. Topic: General - Other >> Aug 22, 2019  9:43 AM Carolyn Stare wrote: Pt wife call to ask if Dr Elease Hashimoto will RX pt some PREDNISOME he is having pain in his arm and hand that is swollen

## 2019-08-22 NOTE — Telephone Encounter (Signed)
Dr. Elease Hashimoto is not in the office today. Please advise.

## 2019-08-22 NOTE — Telephone Encounter (Signed)
I have sent in a few days of Tramadol. All other request for pain medication will need to come through his PCP

## 2019-08-22 NOTE — Telephone Encounter (Signed)
Message sent to Dr. Jerilee Hoh as Dr. Elease Hashimoto is out today. See additional TE dated 08/22/19.

## 2019-08-22 NOTE — Telephone Encounter (Signed)
Copied from [place] [id number]. Topic: Quick Communication - See Telephone Encounter >> Aug 22, 2019  9:57 AM Loma [place] wrote: CRM for notification. See Telephone encounter for: 08/22/19. For Dr. Jacinto Reap nurse, pls call Ms Maraj back. Robert Rowe is in very much pain and they must have something to help him cope with the pain. She says she knows that Dr B is not in town but they must have something for him. Call back is [phone]

## 2019-08-23 ENCOUNTER — Telehealth: Payer: Self-pay | Admitting: Family Medicine

## 2019-08-23 NOTE — Telephone Encounter (Signed)
Please advise. Temporary supply of [facility]ad[facility] yesterday.

## 2019-08-23 NOTE — Telephone Encounter (Signed)
ok 

## 2019-08-23 NOTE — Telephone Encounter (Signed)
Herschel Senegal with [facility] is calling in to request vo for OT to go out to exam pt's hand to see what they could do for pt's hand. They had been trying to get a splint but has been unsuccessful.   They would like to go out to the home next week.    Please advise   CB: [phone]

## 2019-08-23 NOTE — Telephone Encounter (Signed)
Okay to give verbal order.

## 2019-08-23 NOTE — Telephone Encounter (Signed)
Without hx of gout, somewhat reluctant to prescribe prednisone without assessing first.  Would wife be OK to set up Doxy?

## 2019-08-26 ENCOUNTER — Telehealth (INDEPENDENT_AMBULATORY_CARE_PROVIDER_SITE_OTHER): Payer: Medicare Other | Admitting: Family Medicine

## 2019-08-26 ENCOUNTER — Other Ambulatory Visit: Payer: Self-pay

## 2019-08-26 DIAGNOSIS — M25531 Pain in right wrist: Secondary | ICD-10-CM

## 2019-08-26 MED ORDER — PREDNISONE 10 MG PO TABS: ORAL_TABLET | ORAL | 0 refills | Status: DC

## 2019-08-26 NOTE — Telephone Encounter (Signed)
Patient called back and spoke with Arbie Cookey for assistance with scheduling.

## 2019-08-26 NOTE — Telephone Encounter (Signed)
Left message for patient to call back. CRM created 

## 2019-08-26 NOTE — Progress Notes (Signed)
This visit type was conducted due to national recommendations for restrictions regarding the COVID-19 pandemic in an effort to limit this patient's exposure and mitigate transmission in our community.   Virtual Visit via Telephone Note  I connected with [name] on 08/26/19 at  4:00 PM EDT by telephone and verified that I am speaking with the correct person using two identifiers.   I discussed the limitations, risks, security and privacy concerns of performing an evaluation and management service by telephone and the availability of in person appointments. I also discussed with the patient that there may be a patient responsible charge related to this service. The patient expressed understanding and agreed to proceed.  Location patient: home Location provider: work or home office Participants present for the call: patient, provider, patient's wife Annmarie.  She assisted with communication because he has had previous stroke with dysarthria Patient did not have a visit in the prior 7 days to address this/these issue(s).   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  They called with onset last week of some right wrist redness and warmth .  He apparently woke up with this.  He is basically bedbound at this point not ambulating and there is no history of injury.  He does not have any known history of gout or pseudogout.  They apparently had requested last week some prednisone.  Limited supply of tramadol was called in.  That has not helped much.  They have not noted any fever.  No chills.  No red streaks, of the arm.  Wife states his right wrist is slightly warm to touch.  They requested prednisone last week. He does have right-sided hemiparesis from recent stroke   Observations/Objective: Patient sounds cheerful and well on the phone. I do not appreciate any SOB. Speech and thought processing are grossly intact. Patient reported  vitals:  Assessment and Plan: Right wrist pain.  Unfortunately, we were unable to do Doxy.  By description it sounds like there is some acute inflammation.  There concerns whether this could be gout related.  We mentioned pseudogout could also be in differential.  He has not had any fever or erythematous streaks to suggest likely cellulitis  -We agreed to prednisone taper -Touch base if this is not helping over the next couple of days  Follow Up Instructions:  -As above   99441 5-10 99442 11-20 99443 21-30 I did not refer this patient for an OV in the next 24 hours for this/these issue(s).  I discussed the assessment and treatment plan with the patient. The patient was provided an opportunity to ask questions and all were answered. The patient agreed with the plan and demonstrated an understanding of the instructions.   The patient was advised to call back or seek an in-person evaluation if the symptoms worsen or if the condition fails to improve as anticipated.  I provided 16 minutes of non-face-to-face time during this encounter.   Carolann Littler, MD

## 2019-08-26 NOTE — Telephone Encounter (Signed)
Verbal order given  

## 2019-08-27 DIAGNOSIS — M4313 Spondylolisthesis, cervicothoracic region: Secondary | ICD-10-CM | POA: Diagnosis not present

## 2019-08-27 DIAGNOSIS — M4803 Spinal stenosis, cervicothoracic region: Secondary | ICD-10-CM | POA: Diagnosis not present

## 2019-08-27 DIAGNOSIS — I5032 Chronic diastolic (congestive) heart failure: Secondary | ICD-10-CM | POA: Diagnosis not present

## 2019-08-27 DIAGNOSIS — N183 Chronic kidney disease, stage 3 unspecified: Secondary | ICD-10-CM | POA: Diagnosis not present

## 2019-08-27 DIAGNOSIS — I69351 Hemiplegia and hemiparesis following cerebral infarction affecting right dominant side: Secondary | ICD-10-CM | POA: Diagnosis not present

## 2019-08-28 DIAGNOSIS — I5032 Chronic diastolic (congestive) heart failure: Secondary | ICD-10-CM | POA: Diagnosis not present

## 2019-08-28 DIAGNOSIS — M4313 Spondylolisthesis, cervicothoracic region: Secondary | ICD-10-CM | POA: Diagnosis not present

## 2019-08-28 DIAGNOSIS — I69351 Hemiplegia and hemiparesis following cerebral infarction affecting right dominant side: Secondary | ICD-10-CM | POA: Diagnosis not present

## 2019-08-28 DIAGNOSIS — M4803 Spinal stenosis, cervicothoracic region: Secondary | ICD-10-CM | POA: Diagnosis not present

## 2019-08-28 DIAGNOSIS — N183 Chronic kidney disease, stage 3 unspecified: Secondary | ICD-10-CM | POA: Diagnosis not present

## 2019-08-29 ENCOUNTER — Other Ambulatory Visit: Payer: Self-pay | Admitting: Family Medicine

## 2019-08-30 DIAGNOSIS — Z8673 Personal history of transient ischemic attack (TIA), and cerebral infarction without residual deficits: Secondary | ICD-10-CM | POA: Diagnosis not present

## 2019-08-30 DIAGNOSIS — N183 Chronic kidney disease, stage 3 unspecified: Secondary | ICD-10-CM | POA: Diagnosis not present

## 2019-08-30 DIAGNOSIS — I69398 Other sequelae of cerebral infarction: Secondary | ICD-10-CM | POA: Diagnosis not present

## 2019-08-30 DIAGNOSIS — I639 Cerebral infarction, unspecified: Secondary | ICD-10-CM | POA: Diagnosis not present

## 2019-08-30 DIAGNOSIS — G459 Transient cerebral ischemic attack, unspecified: Secondary | ICD-10-CM | POA: Diagnosis not present

## 2019-08-30 DIAGNOSIS — R269 Unspecified abnormalities of gait and mobility: Secondary | ICD-10-CM | POA: Diagnosis not present

## 2019-08-30 DIAGNOSIS — I69351 Hemiplegia and hemiparesis following cerebral infarction affecting right dominant side: Secondary | ICD-10-CM | POA: Diagnosis not present

## 2019-08-30 DIAGNOSIS — M4803 Spinal stenosis, cervicothoracic region: Secondary | ICD-10-CM | POA: Diagnosis not present

## 2019-08-30 DIAGNOSIS — M4313 Spondylolisthesis, cervicothoracic region: Secondary | ICD-10-CM | POA: Diagnosis not present

## 2019-08-30 DIAGNOSIS — I5032 Chronic diastolic (congestive) heart failure: Secondary | ICD-10-CM | POA: Diagnosis not present

## 2019-09-04 DIAGNOSIS — I5032 Chronic diastolic (congestive) heart failure: Secondary | ICD-10-CM | POA: Diagnosis not present

## 2019-09-04 DIAGNOSIS — I69351 Hemiplegia and hemiparesis following cerebral infarction affecting right dominant side: Secondary | ICD-10-CM | POA: Diagnosis not present

## 2019-09-04 DIAGNOSIS — N183 Chronic kidney disease, stage 3 unspecified: Secondary | ICD-10-CM | POA: Diagnosis not present

## 2019-09-04 DIAGNOSIS — M4803 Spinal stenosis, cervicothoracic region: Secondary | ICD-10-CM | POA: Diagnosis not present

## 2019-09-04 DIAGNOSIS — M4313 Spondylolisthesis, cervicothoracic region: Secondary | ICD-10-CM | POA: Diagnosis not present

## 2019-09-06 DIAGNOSIS — I5032 Chronic diastolic (congestive) heart failure: Secondary | ICD-10-CM | POA: Diagnosis not present

## 2019-09-06 DIAGNOSIS — M4803 Spinal stenosis, cervicothoracic region: Secondary | ICD-10-CM | POA: Diagnosis not present

## 2019-09-06 DIAGNOSIS — M4313 Spondylolisthesis, cervicothoracic region: Secondary | ICD-10-CM | POA: Diagnosis not present

## 2019-09-06 DIAGNOSIS — N183 Chronic kidney disease, stage 3 unspecified: Secondary | ICD-10-CM | POA: Diagnosis not present

## 2019-09-06 DIAGNOSIS — I69351 Hemiplegia and hemiparesis following cerebral infarction affecting right dominant side: Secondary | ICD-10-CM | POA: Diagnosis not present

## 2019-09-10 DIAGNOSIS — M4313 Spondylolisthesis, cervicothoracic region: Secondary | ICD-10-CM | POA: Diagnosis not present

## 2019-09-10 DIAGNOSIS — I5032 Chronic diastolic (congestive) heart failure: Secondary | ICD-10-CM | POA: Diagnosis not present

## 2019-09-10 DIAGNOSIS — I69351 Hemiplegia and hemiparesis following cerebral infarction affecting right dominant side: Secondary | ICD-10-CM | POA: Diagnosis not present

## 2019-09-10 DIAGNOSIS — M4803 Spinal stenosis, cervicothoracic region: Secondary | ICD-10-CM | POA: Diagnosis not present

## 2019-09-10 DIAGNOSIS — N183 Chronic kidney disease, stage 3 unspecified: Secondary | ICD-10-CM | POA: Diagnosis not present

## 2019-09-11 DIAGNOSIS — N183 Chronic kidney disease, stage 3 unspecified: Secondary | ICD-10-CM | POA: Diagnosis not present

## 2019-09-11 DIAGNOSIS — I69351 Hemiplegia and hemiparesis following cerebral infarction affecting right dominant side: Secondary | ICD-10-CM | POA: Diagnosis not present

## 2019-09-11 DIAGNOSIS — M4313 Spondylolisthesis, cervicothoracic region: Secondary | ICD-10-CM | POA: Diagnosis not present

## 2019-09-11 DIAGNOSIS — M4803 Spinal stenosis, cervicothoracic region: Secondary | ICD-10-CM | POA: Diagnosis not present

## 2019-09-11 DIAGNOSIS — I5032 Chronic diastolic (congestive) heart failure: Secondary | ICD-10-CM | POA: Diagnosis not present

## 2019-09-13 DIAGNOSIS — I69351 Hemiplegia and hemiparesis following cerebral infarction affecting right dominant side: Secondary | ICD-10-CM | POA: Diagnosis not present

## 2019-09-13 DIAGNOSIS — M4803 Spinal stenosis, cervicothoracic region: Secondary | ICD-10-CM | POA: Diagnosis not present

## 2019-09-13 DIAGNOSIS — M4313 Spondylolisthesis, cervicothoracic region: Secondary | ICD-10-CM | POA: Diagnosis not present

## 2019-09-13 DIAGNOSIS — N183 Chronic kidney disease, stage 3 unspecified: Secondary | ICD-10-CM | POA: Diagnosis not present

## 2019-09-13 DIAGNOSIS — I5032 Chronic diastolic (congestive) heart failure: Secondary | ICD-10-CM | POA: Diagnosis not present

## 2019-09-18 DIAGNOSIS — I5032 Chronic diastolic (congestive) heart failure: Secondary | ICD-10-CM | POA: Diagnosis not present

## 2019-09-18 DIAGNOSIS — M4313 Spondylolisthesis, cervicothoracic region: Secondary | ICD-10-CM | POA: Diagnosis not present

## 2019-09-18 DIAGNOSIS — I69351 Hemiplegia and hemiparesis following cerebral infarction affecting right dominant side: Secondary | ICD-10-CM | POA: Diagnosis not present

## 2019-09-18 DIAGNOSIS — M4803 Spinal stenosis, cervicothoracic region: Secondary | ICD-10-CM | POA: Diagnosis not present

## 2019-09-18 DIAGNOSIS — N183 Chronic kidney disease, stage 3 unspecified: Secondary | ICD-10-CM | POA: Diagnosis not present

## 2019-09-19 DIAGNOSIS — I5032 Chronic diastolic (congestive) heart failure: Secondary | ICD-10-CM | POA: Diagnosis not present

## 2019-09-19 DIAGNOSIS — R1310 Dysphagia, unspecified: Secondary | ICD-10-CM | POA: Diagnosis not present

## 2019-09-19 DIAGNOSIS — Z515 Encounter for palliative care: Secondary | ICD-10-CM | POA: Diagnosis not present

## 2019-09-19 DIAGNOSIS — I639 Cerebral infarction, unspecified: Secondary | ICD-10-CM | POA: Diagnosis not present

## 2019-09-20 DIAGNOSIS — M4313 Spondylolisthesis, cervicothoracic region: Secondary | ICD-10-CM | POA: Diagnosis not present

## 2019-09-20 DIAGNOSIS — I69351 Hemiplegia and hemiparesis following cerebral infarction affecting right dominant side: Secondary | ICD-10-CM | POA: Diagnosis not present

## 2019-09-20 DIAGNOSIS — M4803 Spinal stenosis, cervicothoracic region: Secondary | ICD-10-CM | POA: Diagnosis not present

## 2019-09-20 DIAGNOSIS — I5032 Chronic diastolic (congestive) heart failure: Secondary | ICD-10-CM | POA: Diagnosis not present

## 2019-09-20 DIAGNOSIS — N183 Chronic kidney disease, stage 3 unspecified: Secondary | ICD-10-CM | POA: Diagnosis not present

## 2019-09-23 DIAGNOSIS — I5032 Chronic diastolic (congestive) heart failure: Secondary | ICD-10-CM | POA: Diagnosis not present

## 2019-09-23 DIAGNOSIS — M4313 Spondylolisthesis, cervicothoracic region: Secondary | ICD-10-CM | POA: Diagnosis not present

## 2019-09-23 DIAGNOSIS — N183 Chronic kidney disease, stage 3 unspecified: Secondary | ICD-10-CM | POA: Diagnosis not present

## 2019-09-23 DIAGNOSIS — I69351 Hemiplegia and hemiparesis following cerebral infarction affecting right dominant side: Secondary | ICD-10-CM | POA: Diagnosis not present

## 2019-09-23 DIAGNOSIS — M4803 Spinal stenosis, cervicothoracic region: Secondary | ICD-10-CM | POA: Diagnosis not present

## 2019-09-25 DIAGNOSIS — I69351 Hemiplegia and hemiparesis following cerebral infarction affecting right dominant side: Secondary | ICD-10-CM | POA: Diagnosis not present

## 2019-09-25 DIAGNOSIS — M4803 Spinal stenosis, cervicothoracic region: Secondary | ICD-10-CM | POA: Diagnosis not present

## 2019-09-25 DIAGNOSIS — N183 Chronic kidney disease, stage 3 unspecified: Secondary | ICD-10-CM | POA: Diagnosis not present

## 2019-09-25 DIAGNOSIS — I5032 Chronic diastolic (congestive) heart failure: Secondary | ICD-10-CM | POA: Diagnosis not present

## 2019-09-25 DIAGNOSIS — M4313 Spondylolisthesis, cervicothoracic region: Secondary | ICD-10-CM | POA: Diagnosis not present

## 2019-09-27 DIAGNOSIS — M4803 Spinal stenosis, cervicothoracic region: Secondary | ICD-10-CM | POA: Diagnosis not present

## 2019-09-27 DIAGNOSIS — M4313 Spondylolisthesis, cervicothoracic region: Secondary | ICD-10-CM | POA: Diagnosis not present

## 2019-09-27 DIAGNOSIS — I69351 Hemiplegia and hemiparesis following cerebral infarction affecting right dominant side: Secondary | ICD-10-CM | POA: Diagnosis not present

## 2019-09-27 DIAGNOSIS — I5032 Chronic diastolic (congestive) heart failure: Secondary | ICD-10-CM | POA: Diagnosis not present

## 2019-09-27 DIAGNOSIS — N183 Chronic kidney disease, stage 3 unspecified: Secondary | ICD-10-CM | POA: Diagnosis not present

## 2019-09-30 DIAGNOSIS — I639 Cerebral infarction, unspecified: Secondary | ICD-10-CM | POA: Diagnosis not present

## 2019-09-30 DIAGNOSIS — N183 Chronic kidney disease, stage 3 unspecified: Secondary | ICD-10-CM | POA: Diagnosis not present

## 2019-09-30 DIAGNOSIS — I5032 Chronic diastolic (congestive) heart failure: Secondary | ICD-10-CM | POA: Diagnosis not present

## 2019-09-30 DIAGNOSIS — Z8673 Personal history of transient ischemic attack (TIA), and cerebral infarction without residual deficits: Secondary | ICD-10-CM | POA: Diagnosis not present

## 2019-09-30 DIAGNOSIS — I69398 Other sequelae of cerebral infarction: Secondary | ICD-10-CM | POA: Diagnosis not present

## 2019-09-30 DIAGNOSIS — M4803 Spinal stenosis, cervicothoracic region: Secondary | ICD-10-CM | POA: Diagnosis not present

## 2019-09-30 DIAGNOSIS — G459 Transient cerebral ischemic attack, unspecified: Secondary | ICD-10-CM | POA: Diagnosis not present

## 2019-09-30 DIAGNOSIS — M4313 Spondylolisthesis, cervicothoracic region: Secondary | ICD-10-CM | POA: Diagnosis not present

## 2019-09-30 DIAGNOSIS — I69351 Hemiplegia and hemiparesis following cerebral infarction affecting right dominant side: Secondary | ICD-10-CM | POA: Diagnosis not present

## 2019-09-30 DIAGNOSIS — R269 Unspecified abnormalities of gait and mobility: Secondary | ICD-10-CM | POA: Diagnosis not present

## 2019-10-02 DIAGNOSIS — M4313 Spondylolisthesis, cervicothoracic region: Secondary | ICD-10-CM | POA: Diagnosis not present

## 2019-10-02 DIAGNOSIS — N183 Chronic kidney disease, stage 3 unspecified: Secondary | ICD-10-CM | POA: Diagnosis not present

## 2019-10-02 DIAGNOSIS — M4803 Spinal stenosis, cervicothoracic region: Secondary | ICD-10-CM | POA: Diagnosis not present

## 2019-10-02 DIAGNOSIS — I69351 Hemiplegia and hemiparesis following cerebral infarction affecting right dominant side: Secondary | ICD-10-CM | POA: Diagnosis not present

## 2019-10-02 DIAGNOSIS — I5032 Chronic diastolic (congestive) heart failure: Secondary | ICD-10-CM | POA: Diagnosis not present

## 2019-10-04 DIAGNOSIS — I69351 Hemiplegia and hemiparesis following cerebral infarction affecting right dominant side: Secondary | ICD-10-CM | POA: Diagnosis not present

## 2019-10-04 DIAGNOSIS — N183 Chronic kidney disease, stage 3 unspecified: Secondary | ICD-10-CM | POA: Diagnosis not present

## 2019-10-04 DIAGNOSIS — M4803 Spinal stenosis, cervicothoracic region: Secondary | ICD-10-CM | POA: Diagnosis not present

## 2019-10-04 DIAGNOSIS — I5032 Chronic diastolic (congestive) heart failure: Secondary | ICD-10-CM | POA: Diagnosis not present

## 2019-10-04 DIAGNOSIS — M4313 Spondylolisthesis, cervicothoracic region: Secondary | ICD-10-CM | POA: Diagnosis not present

## 2019-10-07 DIAGNOSIS — I69351 Hemiplegia and hemiparesis following cerebral infarction affecting right dominant side: Secondary | ICD-10-CM | POA: Diagnosis not present

## 2019-10-07 DIAGNOSIS — M4803 Spinal stenosis, cervicothoracic region: Secondary | ICD-10-CM | POA: Diagnosis not present

## 2019-10-07 DIAGNOSIS — N183 Chronic kidney disease, stage 3 unspecified: Secondary | ICD-10-CM | POA: Diagnosis not present

## 2019-10-07 DIAGNOSIS — I5032 Chronic diastolic (congestive) heart failure: Secondary | ICD-10-CM | POA: Diagnosis not present

## 2019-10-07 DIAGNOSIS — M4313 Spondylolisthesis, cervicothoracic region: Secondary | ICD-10-CM | POA: Diagnosis not present

## 2019-10-09 DIAGNOSIS — I5032 Chronic diastolic (congestive) heart failure: Secondary | ICD-10-CM | POA: Diagnosis not present

## 2019-10-09 DIAGNOSIS — I69351 Hemiplegia and hemiparesis following cerebral infarction affecting right dominant side: Secondary | ICD-10-CM | POA: Diagnosis not present

## 2019-10-09 DIAGNOSIS — M4313 Spondylolisthesis, cervicothoracic region: Secondary | ICD-10-CM | POA: Diagnosis not present

## 2019-10-09 DIAGNOSIS — N183 Chronic kidney disease, stage 3 unspecified: Secondary | ICD-10-CM | POA: Diagnosis not present

## 2019-10-09 DIAGNOSIS — M4803 Spinal stenosis, cervicothoracic region: Secondary | ICD-10-CM | POA: Diagnosis not present

## 2019-10-11 DIAGNOSIS — M4803 Spinal stenosis, cervicothoracic region: Secondary | ICD-10-CM | POA: Diagnosis not present

## 2019-10-11 DIAGNOSIS — I69351 Hemiplegia and hemiparesis following cerebral infarction affecting right dominant side: Secondary | ICD-10-CM | POA: Diagnosis not present

## 2019-10-11 DIAGNOSIS — N183 Chronic kidney disease, stage 3 unspecified: Secondary | ICD-10-CM | POA: Diagnosis not present

## 2019-10-11 DIAGNOSIS — I5032 Chronic diastolic (congestive) heart failure: Secondary | ICD-10-CM | POA: Diagnosis not present

## 2019-10-11 DIAGNOSIS — M4313 Spondylolisthesis, cervicothoracic region: Secondary | ICD-10-CM | POA: Diagnosis not present

## 2019-10-14 DIAGNOSIS — M4313 Spondylolisthesis, cervicothoracic region: Secondary | ICD-10-CM | POA: Diagnosis not present

## 2019-10-14 DIAGNOSIS — I5032 Chronic diastolic (congestive) heart failure: Secondary | ICD-10-CM | POA: Diagnosis not present

## 2019-10-14 DIAGNOSIS — I69351 Hemiplegia and hemiparesis following cerebral infarction affecting right dominant side: Secondary | ICD-10-CM | POA: Diagnosis not present

## 2019-10-14 DIAGNOSIS — M4803 Spinal stenosis, cervicothoracic region: Secondary | ICD-10-CM | POA: Diagnosis not present

## 2019-10-14 DIAGNOSIS — N183 Chronic kidney disease, stage 3 unspecified: Secondary | ICD-10-CM | POA: Diagnosis not present

## 2019-10-16 DIAGNOSIS — N183 Chronic kidney disease, stage 3 unspecified: Secondary | ICD-10-CM | POA: Diagnosis not present

## 2019-10-16 DIAGNOSIS — I69351 Hemiplegia and hemiparesis following cerebral infarction affecting right dominant side: Secondary | ICD-10-CM | POA: Diagnosis not present

## 2019-10-16 DIAGNOSIS — M4803 Spinal stenosis, cervicothoracic region: Secondary | ICD-10-CM | POA: Diagnosis not present

## 2019-10-16 DIAGNOSIS — I5032 Chronic diastolic (congestive) heart failure: Secondary | ICD-10-CM | POA: Diagnosis not present

## 2019-10-16 DIAGNOSIS — M4313 Spondylolisthesis, cervicothoracic region: Secondary | ICD-10-CM | POA: Diagnosis not present

## 2019-10-20 DIAGNOSIS — Z515 Encounter for palliative care: Secondary | ICD-10-CM | POA: Diagnosis not present

## 2019-10-20 DIAGNOSIS — R1310 Dysphagia, unspecified: Secondary | ICD-10-CM | POA: Diagnosis not present

## 2019-10-20 DIAGNOSIS — I639 Cerebral infarction, unspecified: Secondary | ICD-10-CM | POA: Diagnosis not present

## 2019-10-20 DIAGNOSIS — I5032 Chronic diastolic (congestive) heart failure: Secondary | ICD-10-CM | POA: Diagnosis not present

## 2019-10-21 DIAGNOSIS — M4313 Spondylolisthesis, cervicothoracic region: Secondary | ICD-10-CM | POA: Diagnosis not present

## 2019-10-21 DIAGNOSIS — I69351 Hemiplegia and hemiparesis following cerebral infarction affecting right dominant side: Secondary | ICD-10-CM | POA: Diagnosis not present

## 2019-10-21 DIAGNOSIS — N183 Chronic kidney disease, stage 3 unspecified: Secondary | ICD-10-CM | POA: Diagnosis not present

## 2019-10-21 DIAGNOSIS — M4803 Spinal stenosis, cervicothoracic region: Secondary | ICD-10-CM | POA: Diagnosis not present

## 2019-10-21 DIAGNOSIS — I5032 Chronic diastolic (congestive) heart failure: Secondary | ICD-10-CM | POA: Diagnosis not present

## 2019-10-22 ENCOUNTER — Other Ambulatory Visit: Payer: Self-pay

## 2019-10-22 MED ORDER — ATORVASTATIN CALCIUM 40 MG PO TABS: 40.0000 mg | ORAL_TABLET | Freq: Every day | ORAL | 3 refills | Status: DC

## 2019-10-22 MED ORDER — CLOPIDOGREL BISULFATE 75 MG PO TABS: 75.0000 mg | ORAL_TABLET | Freq: Every day | ORAL | 3 refills | Status: DC

## 2019-10-23 DIAGNOSIS — N183 Chronic kidney disease, stage 3 unspecified: Secondary | ICD-10-CM | POA: Diagnosis not present

## 2019-10-23 DIAGNOSIS — M4313 Spondylolisthesis, cervicothoracic region: Secondary | ICD-10-CM | POA: Diagnosis not present

## 2019-10-23 DIAGNOSIS — M4803 Spinal stenosis, cervicothoracic region: Secondary | ICD-10-CM | POA: Diagnosis not present

## 2019-10-23 DIAGNOSIS — I5032 Chronic diastolic (congestive) heart failure: Secondary | ICD-10-CM | POA: Diagnosis not present

## 2019-10-23 DIAGNOSIS — I69351 Hemiplegia and hemiparesis following cerebral infarction affecting right dominant side: Secondary | ICD-10-CM | POA: Diagnosis not present

## 2019-10-28 DIAGNOSIS — M4803 Spinal stenosis, cervicothoracic region: Secondary | ICD-10-CM | POA: Diagnosis not present

## 2019-10-28 DIAGNOSIS — M4313 Spondylolisthesis, cervicothoracic region: Secondary | ICD-10-CM | POA: Diagnosis not present

## 2019-10-28 DIAGNOSIS — I5032 Chronic diastolic (congestive) heart failure: Secondary | ICD-10-CM | POA: Diagnosis not present

## 2019-10-28 DIAGNOSIS — N183 Chronic kidney disease, stage 3 unspecified: Secondary | ICD-10-CM | POA: Diagnosis not present

## 2019-10-28 DIAGNOSIS — I69351 Hemiplegia and hemiparesis following cerebral infarction affecting right dominant side: Secondary | ICD-10-CM | POA: Diagnosis not present

## 2019-10-30 DIAGNOSIS — I5032 Chronic diastolic (congestive) heart failure: Secondary | ICD-10-CM | POA: Diagnosis not present

## 2019-10-30 DIAGNOSIS — M4803 Spinal stenosis, cervicothoracic region: Secondary | ICD-10-CM | POA: Diagnosis not present

## 2019-10-30 DIAGNOSIS — I69351 Hemiplegia and hemiparesis following cerebral infarction affecting right dominant side: Secondary | ICD-10-CM | POA: Diagnosis not present

## 2019-10-30 DIAGNOSIS — I639 Cerebral infarction, unspecified: Secondary | ICD-10-CM | POA: Diagnosis not present

## 2019-10-30 DIAGNOSIS — R269 Unspecified abnormalities of gait and mobility: Secondary | ICD-10-CM | POA: Diagnosis not present

## 2019-10-30 DIAGNOSIS — I69398 Other sequelae of cerebral infarction: Secondary | ICD-10-CM | POA: Diagnosis not present

## 2019-10-30 DIAGNOSIS — G459 Transient cerebral ischemic attack, unspecified: Secondary | ICD-10-CM | POA: Diagnosis not present

## 2019-10-30 DIAGNOSIS — Z8673 Personal history of transient ischemic attack (TIA), and cerebral infarction without residual deficits: Secondary | ICD-10-CM | POA: Diagnosis not present

## 2019-10-30 DIAGNOSIS — M4313 Spondylolisthesis, cervicothoracic region: Secondary | ICD-10-CM | POA: Diagnosis not present

## 2019-10-30 DIAGNOSIS — N183 Chronic kidney disease, stage 3 unspecified: Secondary | ICD-10-CM | POA: Diagnosis not present

## 2019-11-03 ENCOUNTER — Other Ambulatory Visit: Payer: Self-pay | Admitting: Family Medicine

## 2019-11-04 DIAGNOSIS — I5032 Chronic diastolic (congestive) heart failure: Secondary | ICD-10-CM | POA: Diagnosis not present

## 2019-11-04 DIAGNOSIS — I69351 Hemiplegia and hemiparesis following cerebral infarction affecting right dominant side: Secondary | ICD-10-CM | POA: Diagnosis not present

## 2019-11-04 DIAGNOSIS — M4313 Spondylolisthesis, cervicothoracic region: Secondary | ICD-10-CM | POA: Diagnosis not present

## 2019-11-04 DIAGNOSIS — N183 Chronic kidney disease, stage 3 unspecified: Secondary | ICD-10-CM | POA: Diagnosis not present

## 2019-11-04 DIAGNOSIS — M4803 Spinal stenosis, cervicothoracic region: Secondary | ICD-10-CM | POA: Diagnosis not present

## 2019-11-06 DIAGNOSIS — I5032 Chronic diastolic (congestive) heart failure: Secondary | ICD-10-CM | POA: Diagnosis not present

## 2019-11-06 DIAGNOSIS — I69351 Hemiplegia and hemiparesis following cerebral infarction affecting right dominant side: Secondary | ICD-10-CM | POA: Diagnosis not present

## 2019-11-06 DIAGNOSIS — M4313 Spondylolisthesis, cervicothoracic region: Secondary | ICD-10-CM | POA: Diagnosis not present

## 2019-11-06 DIAGNOSIS — N183 Chronic kidney disease, stage 3 unspecified: Secondary | ICD-10-CM | POA: Diagnosis not present

## 2019-11-06 DIAGNOSIS — M4803 Spinal stenosis, cervicothoracic region: Secondary | ICD-10-CM | POA: Diagnosis not present

## 2019-11-14 DIAGNOSIS — I5032 Chronic diastolic (congestive) heart failure: Secondary | ICD-10-CM | POA: Diagnosis not present

## 2019-11-14 DIAGNOSIS — I69351 Hemiplegia and hemiparesis following cerebral infarction affecting right dominant side: Secondary | ICD-10-CM | POA: Diagnosis not present

## 2019-11-14 DIAGNOSIS — M4803 Spinal stenosis, cervicothoracic region: Secondary | ICD-10-CM | POA: Diagnosis not present

## 2019-11-14 DIAGNOSIS — N183 Chronic kidney disease, stage 3 unspecified: Secondary | ICD-10-CM | POA: Diagnosis not present

## 2019-11-14 DIAGNOSIS — M4313 Spondylolisthesis, cervicothoracic region: Secondary | ICD-10-CM | POA: Diagnosis not present

## 2019-11-19 DIAGNOSIS — I639 Cerebral infarction, unspecified: Secondary | ICD-10-CM | POA: Diagnosis not present

## 2019-11-19 DIAGNOSIS — Z515 Encounter for palliative care: Secondary | ICD-10-CM | POA: Diagnosis not present

## 2019-11-19 DIAGNOSIS — R1310 Dysphagia, unspecified: Secondary | ICD-10-CM | POA: Diagnosis not present

## 2019-11-19 DIAGNOSIS — I5032 Chronic diastolic (congestive) heart failure: Secondary | ICD-10-CM | POA: Diagnosis not present

## 2019-11-20 DIAGNOSIS — M4803 Spinal stenosis, cervicothoracic region: Secondary | ICD-10-CM | POA: Diagnosis not present

## 2019-11-20 DIAGNOSIS — I69351 Hemiplegia and hemiparesis following cerebral infarction affecting right dominant side: Secondary | ICD-10-CM | POA: Diagnosis not present

## 2019-11-20 DIAGNOSIS — M4313 Spondylolisthesis, cervicothoracic region: Secondary | ICD-10-CM | POA: Diagnosis not present

## 2019-11-20 DIAGNOSIS — I5032 Chronic diastolic (congestive) heart failure: Secondary | ICD-10-CM | POA: Diagnosis not present

## 2019-11-20 DIAGNOSIS — M47813 Spondylosis without myelopathy or radiculopathy, cervicothoracic region: Secondary | ICD-10-CM | POA: Diagnosis not present

## 2019-11-22 DIAGNOSIS — I69351 Hemiplegia and hemiparesis following cerebral infarction affecting right dominant side: Secondary | ICD-10-CM | POA: Diagnosis not present

## 2019-11-22 DIAGNOSIS — M47813 Spondylosis without myelopathy or radiculopathy, cervicothoracic region: Secondary | ICD-10-CM | POA: Diagnosis not present

## 2019-11-22 DIAGNOSIS — M4803 Spinal stenosis, cervicothoracic region: Secondary | ICD-10-CM | POA: Diagnosis not present

## 2019-11-22 DIAGNOSIS — I5032 Chronic diastolic (congestive) heart failure: Secondary | ICD-10-CM | POA: Diagnosis not present

## 2019-11-22 DIAGNOSIS — M4313 Spondylolisthesis, cervicothoracic region: Secondary | ICD-10-CM | POA: Diagnosis not present

## 2019-11-25 DIAGNOSIS — I5032 Chronic diastolic (congestive) heart failure: Secondary | ICD-10-CM | POA: Diagnosis not present

## 2019-11-25 DIAGNOSIS — M4803 Spinal stenosis, cervicothoracic region: Secondary | ICD-10-CM | POA: Diagnosis not present

## 2019-11-25 DIAGNOSIS — M47813 Spondylosis without myelopathy or radiculopathy, cervicothoracic region: Secondary | ICD-10-CM | POA: Diagnosis not present

## 2019-11-25 DIAGNOSIS — M4313 Spondylolisthesis, cervicothoracic region: Secondary | ICD-10-CM | POA: Diagnosis not present

## 2019-11-25 DIAGNOSIS — I69351 Hemiplegia and hemiparesis following cerebral infarction affecting right dominant side: Secondary | ICD-10-CM | POA: Diagnosis not present

## 2019-11-27 DIAGNOSIS — I69351 Hemiplegia and hemiparesis following cerebral infarction affecting right dominant side: Secondary | ICD-10-CM | POA: Diagnosis not present

## 2019-11-27 DIAGNOSIS — M47813 Spondylosis without myelopathy or radiculopathy, cervicothoracic region: Secondary | ICD-10-CM | POA: Diagnosis not present

## 2019-11-27 DIAGNOSIS — M4313 Spondylolisthesis, cervicothoracic region: Secondary | ICD-10-CM | POA: Diagnosis not present

## 2019-11-27 DIAGNOSIS — I5032 Chronic diastolic (congestive) heart failure: Secondary | ICD-10-CM | POA: Diagnosis not present

## 2019-11-27 DIAGNOSIS — M4803 Spinal stenosis, cervicothoracic region: Secondary | ICD-10-CM | POA: Diagnosis not present

## 2019-11-30 DIAGNOSIS — I69398 Other sequelae of cerebral infarction: Secondary | ICD-10-CM | POA: Diagnosis not present

## 2019-11-30 DIAGNOSIS — R269 Unspecified abnormalities of gait and mobility: Secondary | ICD-10-CM | POA: Diagnosis not present

## 2019-11-30 DIAGNOSIS — I639 Cerebral infarction, unspecified: Secondary | ICD-10-CM | POA: Diagnosis not present

## 2019-11-30 DIAGNOSIS — Z8673 Personal history of transient ischemic attack (TIA), and cerebral infarction without residual deficits: Secondary | ICD-10-CM | POA: Diagnosis not present

## 2019-11-30 DIAGNOSIS — G459 Transient cerebral ischemic attack, unspecified: Secondary | ICD-10-CM | POA: Diagnosis not present

## 2019-12-02 DIAGNOSIS — M4313 Spondylolisthesis, cervicothoracic region: Secondary | ICD-10-CM | POA: Diagnosis not present

## 2019-12-02 DIAGNOSIS — I5032 Chronic diastolic (congestive) heart failure: Secondary | ICD-10-CM | POA: Diagnosis not present

## 2019-12-02 DIAGNOSIS — I69351 Hemiplegia and hemiparesis following cerebral infarction affecting right dominant side: Secondary | ICD-10-CM | POA: Diagnosis not present

## 2019-12-02 DIAGNOSIS — M47813 Spondylosis without myelopathy or radiculopathy, cervicothoracic region: Secondary | ICD-10-CM | POA: Diagnosis not present

## 2019-12-02 DIAGNOSIS — M4803 Spinal stenosis, cervicothoracic region: Secondary | ICD-10-CM | POA: Diagnosis not present

## 2019-12-04 DIAGNOSIS — M4313 Spondylolisthesis, cervicothoracic region: Secondary | ICD-10-CM | POA: Diagnosis not present

## 2019-12-04 DIAGNOSIS — I69351 Hemiplegia and hemiparesis following cerebral infarction affecting right dominant side: Secondary | ICD-10-CM | POA: Diagnosis not present

## 2019-12-04 DIAGNOSIS — M4803 Spinal stenosis, cervicothoracic region: Secondary | ICD-10-CM | POA: Diagnosis not present

## 2019-12-04 DIAGNOSIS — I5032 Chronic diastolic (congestive) heart failure: Secondary | ICD-10-CM | POA: Diagnosis not present

## 2019-12-04 DIAGNOSIS — M47813 Spondylosis without myelopathy or radiculopathy, cervicothoracic region: Secondary | ICD-10-CM | POA: Diagnosis not present

## 2019-12-06 ENCOUNTER — Ambulatory Visit: Payer: Medicare Other | Attending: Internal Medicine

## 2019-12-06 ENCOUNTER — Telehealth: Payer: Self-pay | Admitting: Family Medicine

## 2019-12-06 DIAGNOSIS — Z23 Encounter for immunization: Secondary | ICD-10-CM | POA: Insufficient documentation

## 2019-12-06 NOTE — Telephone Encounter (Signed)
Pt needs Jinny Blossom to call his son [name] [place]loff at [phone] to discuss the order to extend PT with [facility]. [facility] states they cant find it and [employer] can not find anything as well. [name] need Jinny Blossom to email him the extension request from Dec 16th  or 17th so they wont end it today / email is [email or web address] Sears Derrek Monaco call asap

## 2019-12-06 NOTE — Telephone Encounter (Signed)
Message Routed to PCP CMA 

## 2019-12-06 NOTE — Telephone Encounter (Signed)
Called son and let him know that I am working on this. Son verbalized an understanding.

## 2019-12-06 NOTE — Telephone Encounter (Signed)
Called [place] at [phone] and spoke to [place]. She stated that there is no discharge for this patient. Raquel Sarna also stated that patient still has several weeks of PT. Emily sent me to [facility] and I left a detailed voice message for her to call me back to let me know if she needs extended orders or what we need to do to keep continued care.  OK for PEC to discuss/advise/ and/or schedule patient.  CRM Created.

## 2019-12-09 DIAGNOSIS — M4803 Spinal stenosis, cervicothoracic region: Secondary | ICD-10-CM | POA: Diagnosis not present

## 2019-12-09 DIAGNOSIS — I69351 Hemiplegia and hemiparesis following cerebral infarction affecting right dominant side: Secondary | ICD-10-CM | POA: Diagnosis not present

## 2019-12-09 DIAGNOSIS — M47813 Spondylosis without myelopathy or radiculopathy, cervicothoracic region: Secondary | ICD-10-CM | POA: Diagnosis not present

## 2019-12-09 DIAGNOSIS — I5032 Chronic diastolic (congestive) heart failure: Secondary | ICD-10-CM | POA: Diagnosis not present

## 2019-12-09 DIAGNOSIS — M4313 Spondylolisthesis, cervicothoracic region: Secondary | ICD-10-CM | POA: Diagnosis not present

## 2019-12-11 DIAGNOSIS — M47813 Spondylosis without myelopathy or radiculopathy, cervicothoracic region: Secondary | ICD-10-CM | POA: Diagnosis not present

## 2019-12-11 DIAGNOSIS — M4803 Spinal stenosis, cervicothoracic region: Secondary | ICD-10-CM | POA: Diagnosis not present

## 2019-12-11 DIAGNOSIS — I69351 Hemiplegia and hemiparesis following cerebral infarction affecting right dominant side: Secondary | ICD-10-CM | POA: Diagnosis not present

## 2019-12-11 DIAGNOSIS — M4313 Spondylolisthesis, cervicothoracic region: Secondary | ICD-10-CM | POA: Diagnosis not present

## 2019-12-11 DIAGNOSIS — I5032 Chronic diastolic (congestive) heart failure: Secondary | ICD-10-CM | POA: Diagnosis not present

## 2019-12-16 DIAGNOSIS — M4313 Spondylolisthesis, cervicothoracic region: Secondary | ICD-10-CM | POA: Diagnosis not present

## 2019-12-16 DIAGNOSIS — I69351 Hemiplegia and hemiparesis following cerebral infarction affecting right dominant side: Secondary | ICD-10-CM | POA: Diagnosis not present

## 2019-12-16 DIAGNOSIS — I5032 Chronic diastolic (congestive) heart failure: Secondary | ICD-10-CM | POA: Diagnosis not present

## 2019-12-16 DIAGNOSIS — M4803 Spinal stenosis, cervicothoracic region: Secondary | ICD-10-CM | POA: Diagnosis not present

## 2019-12-16 DIAGNOSIS — M47813 Spondylosis without myelopathy or radiculopathy, cervicothoracic region: Secondary | ICD-10-CM | POA: Diagnosis not present

## 2019-12-18 DIAGNOSIS — I69351 Hemiplegia and hemiparesis following cerebral infarction affecting right dominant side: Secondary | ICD-10-CM | POA: Diagnosis not present

## 2019-12-18 DIAGNOSIS — M47813 Spondylosis without myelopathy or radiculopathy, cervicothoracic region: Secondary | ICD-10-CM | POA: Diagnosis not present

## 2019-12-18 DIAGNOSIS — M4313 Spondylolisthesis, cervicothoracic region: Secondary | ICD-10-CM | POA: Diagnosis not present

## 2019-12-18 DIAGNOSIS — M4803 Spinal stenosis, cervicothoracic region: Secondary | ICD-10-CM | POA: Diagnosis not present

## 2019-12-18 DIAGNOSIS — I5032 Chronic diastolic (congestive) heart failure: Secondary | ICD-10-CM | POA: Diagnosis not present

## 2019-12-19 ENCOUNTER — Other Ambulatory Visit: Payer: Self-pay | Admitting: Family Medicine

## 2019-12-20 DIAGNOSIS — R1310 Dysphagia, unspecified: Secondary | ICD-10-CM | POA: Diagnosis not present

## 2019-12-20 DIAGNOSIS — I5032 Chronic diastolic (congestive) heart failure: Secondary | ICD-10-CM | POA: Diagnosis not present

## 2019-12-20 DIAGNOSIS — I639 Cerebral infarction, unspecified: Secondary | ICD-10-CM | POA: Diagnosis not present

## 2019-12-20 DIAGNOSIS — Z515 Encounter for palliative care: Secondary | ICD-10-CM | POA: Diagnosis not present

## 2019-12-20 NOTE — Telephone Encounter (Signed)
He will need labs.  Go ahead and refill for 3 months.  Consider setting up virtual in the next month or 2 and we could discuss coming in for lab only after his virtual follow-up

## 2019-12-20 NOTE — Telephone Encounter (Signed)
OK to fill for a year? Last TSH was in Feb 2020. Or does he need to have lab?

## 2019-12-23 DIAGNOSIS — M4803 Spinal stenosis, cervicothoracic region: Secondary | ICD-10-CM | POA: Diagnosis not present

## 2019-12-23 DIAGNOSIS — I5032 Chronic diastolic (congestive) heart failure: Secondary | ICD-10-CM | POA: Diagnosis not present

## 2019-12-23 DIAGNOSIS — M4313 Spondylolisthesis, cervicothoracic region: Secondary | ICD-10-CM | POA: Diagnosis not present

## 2019-12-23 DIAGNOSIS — M47813 Spondylosis without myelopathy or radiculopathy, cervicothoracic region: Secondary | ICD-10-CM | POA: Diagnosis not present

## 2019-12-23 DIAGNOSIS — I69351 Hemiplegia and hemiparesis following cerebral infarction affecting right dominant side: Secondary | ICD-10-CM | POA: Diagnosis not present

## 2019-12-25 DIAGNOSIS — M4313 Spondylolisthesis, cervicothoracic region: Secondary | ICD-10-CM | POA: Diagnosis not present

## 2019-12-25 DIAGNOSIS — M4803 Spinal stenosis, cervicothoracic region: Secondary | ICD-10-CM | POA: Diagnosis not present

## 2019-12-25 DIAGNOSIS — I69351 Hemiplegia and hemiparesis following cerebral infarction affecting right dominant side: Secondary | ICD-10-CM | POA: Diagnosis not present

## 2019-12-25 DIAGNOSIS — I5032 Chronic diastolic (congestive) heart failure: Secondary | ICD-10-CM | POA: Diagnosis not present

## 2019-12-25 DIAGNOSIS — M47813 Spondylosis without myelopathy or radiculopathy, cervicothoracic region: Secondary | ICD-10-CM | POA: Diagnosis not present

## 2019-12-27 ENCOUNTER — Ambulatory Visit: Payer: Medicare Other | Attending: Internal Medicine

## 2019-12-27 DIAGNOSIS — Z23 Encounter for immunization: Secondary | ICD-10-CM | POA: Insufficient documentation

## 2019-12-27 NOTE — Progress Notes (Signed)
   Covid-19 Vaccination Clinic  Name:  Robert Rowe    MRN: [id number] DOB: January 12, 1918  12/27/2019  [name] was observed post Covid-19 immunization for 15 minutes without incidence. He was provided with Vaccine Information Sheet and instruction to access the V-Safe system.   [name] was instructed to call 911 with any severe reactions post vaccine: Marland Kitchen Difficulty breathing  . Swelling of your face and throat  . A fast heartbeat  . A bad rash all over your body  . Dizziness and weakness

## 2019-12-30 DIAGNOSIS — I69351 Hemiplegia and hemiparesis following cerebral infarction affecting right dominant side: Secondary | ICD-10-CM | POA: Diagnosis not present

## 2019-12-30 DIAGNOSIS — I5032 Chronic diastolic (congestive) heart failure: Secondary | ICD-10-CM | POA: Diagnosis not present

## 2019-12-30 DIAGNOSIS — M4313 Spondylolisthesis, cervicothoracic region: Secondary | ICD-10-CM | POA: Diagnosis not present

## 2019-12-30 DIAGNOSIS — M4803 Spinal stenosis, cervicothoracic region: Secondary | ICD-10-CM | POA: Diagnosis not present

## 2019-12-30 DIAGNOSIS — M47813 Spondylosis without myelopathy or radiculopathy, cervicothoracic region: Secondary | ICD-10-CM | POA: Diagnosis not present

## 2019-12-31 DIAGNOSIS — Z8673 Personal history of transient ischemic attack (TIA), and cerebral infarction without residual deficits: Secondary | ICD-10-CM | POA: Diagnosis not present

## 2019-12-31 DIAGNOSIS — I639 Cerebral infarction, unspecified: Secondary | ICD-10-CM | POA: Diagnosis not present

## 2019-12-31 DIAGNOSIS — I69398 Other sequelae of cerebral infarction: Secondary | ICD-10-CM | POA: Diagnosis not present

## 2019-12-31 DIAGNOSIS — G459 Transient cerebral ischemic attack, unspecified: Secondary | ICD-10-CM | POA: Diagnosis not present

## 2019-12-31 DIAGNOSIS — R269 Unspecified abnormalities of gait and mobility: Secondary | ICD-10-CM | POA: Diagnosis not present

## 2020-01-01 DIAGNOSIS — M4803 Spinal stenosis, cervicothoracic region: Secondary | ICD-10-CM | POA: Diagnosis not present

## 2020-01-01 DIAGNOSIS — M47813 Spondylosis without myelopathy or radiculopathy, cervicothoracic region: Secondary | ICD-10-CM | POA: Diagnosis not present

## 2020-01-01 DIAGNOSIS — I69351 Hemiplegia and hemiparesis following cerebral infarction affecting right dominant side: Secondary | ICD-10-CM | POA: Diagnosis not present

## 2020-01-01 DIAGNOSIS — M4313 Spondylolisthesis, cervicothoracic region: Secondary | ICD-10-CM | POA: Diagnosis not present

## 2020-01-01 DIAGNOSIS — I5032 Chronic diastolic (congestive) heart failure: Secondary | ICD-10-CM | POA: Diagnosis not present

## 2020-01-06 DIAGNOSIS — I69351 Hemiplegia and hemiparesis following cerebral infarction affecting right dominant side: Secondary | ICD-10-CM | POA: Diagnosis not present

## 2020-01-06 DIAGNOSIS — I5032 Chronic diastolic (congestive) heart failure: Secondary | ICD-10-CM | POA: Diagnosis not present

## 2020-01-06 DIAGNOSIS — M4313 Spondylolisthesis, cervicothoracic region: Secondary | ICD-10-CM | POA: Diagnosis not present

## 2020-01-06 DIAGNOSIS — M4803 Spinal stenosis, cervicothoracic region: Secondary | ICD-10-CM | POA: Diagnosis not present

## 2020-01-06 DIAGNOSIS — M47813 Spondylosis without myelopathy or radiculopathy, cervicothoracic region: Secondary | ICD-10-CM | POA: Diagnosis not present

## 2020-01-08 DIAGNOSIS — M4313 Spondylolisthesis, cervicothoracic region: Secondary | ICD-10-CM | POA: Diagnosis not present

## 2020-01-08 DIAGNOSIS — M4803 Spinal stenosis, cervicothoracic region: Secondary | ICD-10-CM | POA: Diagnosis not present

## 2020-01-08 DIAGNOSIS — M47813 Spondylosis without myelopathy or radiculopathy, cervicothoracic region: Secondary | ICD-10-CM | POA: Diagnosis not present

## 2020-01-08 DIAGNOSIS — I5032 Chronic diastolic (congestive) heart failure: Secondary | ICD-10-CM | POA: Diagnosis not present

## 2020-01-08 DIAGNOSIS — I69351 Hemiplegia and hemiparesis following cerebral infarction affecting right dominant side: Secondary | ICD-10-CM | POA: Diagnosis not present

## 2020-01-13 DIAGNOSIS — M47813 Spondylosis without myelopathy or radiculopathy, cervicothoracic region: Secondary | ICD-10-CM | POA: Diagnosis not present

## 2020-01-13 DIAGNOSIS — I69351 Hemiplegia and hemiparesis following cerebral infarction affecting right dominant side: Secondary | ICD-10-CM | POA: Diagnosis not present

## 2020-01-13 DIAGNOSIS — I5032 Chronic diastolic (congestive) heart failure: Secondary | ICD-10-CM | POA: Diagnosis not present

## 2020-01-13 DIAGNOSIS — M4313 Spondylolisthesis, cervicothoracic region: Secondary | ICD-10-CM | POA: Diagnosis not present

## 2020-01-13 DIAGNOSIS — M4803 Spinal stenosis, cervicothoracic region: Secondary | ICD-10-CM | POA: Diagnosis not present

## 2020-01-15 DIAGNOSIS — I5032 Chronic diastolic (congestive) heart failure: Secondary | ICD-10-CM | POA: Diagnosis not present

## 2020-01-15 DIAGNOSIS — M47813 Spondylosis without myelopathy or radiculopathy, cervicothoracic region: Secondary | ICD-10-CM | POA: Diagnosis not present

## 2020-01-15 DIAGNOSIS — M4803 Spinal stenosis, cervicothoracic region: Secondary | ICD-10-CM | POA: Diagnosis not present

## 2020-01-15 DIAGNOSIS — I69351 Hemiplegia and hemiparesis following cerebral infarction affecting right dominant side: Secondary | ICD-10-CM | POA: Diagnosis not present

## 2020-01-15 DIAGNOSIS — M4313 Spondylolisthesis, cervicothoracic region: Secondary | ICD-10-CM | POA: Diagnosis not present

## 2020-01-17 ENCOUNTER — Other Ambulatory Visit: Payer: Self-pay | Admitting: *Deleted

## 2020-01-17 ENCOUNTER — Telehealth: Payer: Self-pay | Admitting: Family Medicine

## 2020-01-17 DIAGNOSIS — I6300 Cerebral infarction due to thrombosis of unspecified precerebral artery: Secondary | ICD-10-CM

## 2020-01-17 NOTE — Telephone Encounter (Signed)
Orders placed as requested.

## 2020-01-17 NOTE — Telephone Encounter (Signed)
Pt's spouse, Soyla Murphy, stated they need an extension for a referral for Physical therapy. She said the referral expired and they need it resent. She would like to speak to his [place] regarding this.  If it can be sent to [facility].   Soyla Murphy can be reached at [phone] or [phone] -ok to leave detailed message at either number per pt.

## 2020-01-19 DIAGNOSIS — R1310 Dysphagia, unspecified: Secondary | ICD-10-CM | POA: Diagnosis not present

## 2020-01-19 DIAGNOSIS — Z515 Encounter for palliative care: Secondary | ICD-10-CM | POA: Diagnosis not present

## 2020-01-19 DIAGNOSIS — I639 Cerebral infarction, unspecified: Secondary | ICD-10-CM | POA: Diagnosis not present

## 2020-01-19 DIAGNOSIS — I5032 Chronic diastolic (congestive) heart failure: Secondary | ICD-10-CM | POA: Diagnosis not present

## 2020-01-20 ENCOUNTER — Telehealth: Payer: Self-pay | Admitting: Family Medicine

## 2020-01-20 MED ORDER — ALBUTEROL SULFATE HFA 108 (90 BASE) MCG/ACT IN AERS: 2.0000 | INHALATION_SPRAY | Freq: Four times a day (QID) | RESPIRATORY_TRACT | 0 refills | Status: DC | PRN

## 2020-01-20 NOTE — Telephone Encounter (Signed)
May send in Proventil inhaler 2 puffs every 6 hours as needed for cough/wheeze.  Okay to extend physical therapy

## 2020-01-20 NOTE — Telephone Encounter (Signed)
Rx done.  Left a message for the pts son to return a call to the office.

## 2020-01-20 NOTE — Telephone Encounter (Signed)
Pt' son, [name], stated that his father is coughing like he has fluid built up and wheezing. He is wondering if he needs an inhaler. I asked if he wanted to set an appt, he stated that due to pt having a stroke it is impossible rn to get him in and wanted a note sent back.   [name] also would like to see if his physical therapy can be extened with [facility]  [name] can be reached at [phone]

## 2020-01-20 NOTE — Telephone Encounter (Signed)
Patient's son called back and was informed the Rx was sent to the pharmacy.  Stated he will contact [place] regarding the physical therapy and call back to leave a message with contact info.

## 2020-01-24 ENCOUNTER — Telehealth: Payer: Self-pay | Admitting: Family Medicine

## 2020-01-24 NOTE — Telephone Encounter (Signed)
I have reviewed pt chart from previous messages and informed pt that the orders have been place but pt would still like a call from [facility]

## 2020-01-24 NOTE — Telephone Encounter (Signed)
Pt's spouse, Soyla Murphy, states that insurance is suppose to be sending over papers for physical therapy.She was calling to see if this has been completed.  AnneMarie can be reached at [phone]

## 2020-01-24 NOTE — Telephone Encounter (Signed)
Called patient and spoke to his wife and she is asking for Dr. Elease Hashimoto to order extension of PT for patient for Right hand and leg and to help him to be able to get out of bed. Patient stated that he did have improvement with PT and is requesting more. They have used [facility]. Can you send in a new order to extend?

## 2020-01-24 NOTE — Telephone Encounter (Signed)
Find out from [place] if they can accommodate him at this time.

## 2020-01-27 NOTE — Telephone Encounter (Signed)
Called [facility] Lemmings at [phone] and spoke to [place] and gave her a detailed message to continue PT and she is sending a message to the therapy person on call and they will call back.

## 2020-01-28 DIAGNOSIS — Z8673 Personal history of transient ischemic attack (TIA), and cerebral infarction without residual deficits: Secondary | ICD-10-CM | POA: Diagnosis not present

## 2020-01-28 DIAGNOSIS — G459 Transient cerebral ischemic attack, unspecified: Secondary | ICD-10-CM | POA: Diagnosis not present

## 2020-01-28 DIAGNOSIS — I69398 Other sequelae of cerebral infarction: Secondary | ICD-10-CM | POA: Diagnosis not present

## 2020-01-28 DIAGNOSIS — I639 Cerebral infarction, unspecified: Secondary | ICD-10-CM | POA: Diagnosis not present

## 2020-01-28 DIAGNOSIS — R269 Unspecified abnormalities of gait and mobility: Secondary | ICD-10-CM | POA: Diagnosis not present

## 2020-01-28 NOTE — Telephone Encounter (Signed)
Becky from [facility] returned call to clinic. Becky stated at this time they cannot accommodate Mr. Stimpson.

## 2020-01-28 NOTE — Telephone Encounter (Signed)
Please see recent message. Are you able to put in a new order to help patient get out of bed? Patients wife is very concerned and stated that he really needs help with this since it is getting worse.

## 2020-01-28 NOTE — Telephone Encounter (Signed)
Set up Doxy with his wife (and Robert Rowe , if possible)  Placing new HH order usually requires an assessment prior to placing the order.

## 2020-01-29 ENCOUNTER — Telehealth (INDEPENDENT_AMBULATORY_CARE_PROVIDER_SITE_OTHER): Payer: Medicare Other | Admitting: Family Medicine

## 2020-01-29 ENCOUNTER — Other Ambulatory Visit: Payer: Self-pay

## 2020-01-29 DIAGNOSIS — R404 Transient alteration of awareness: Secondary | ICD-10-CM | POA: Diagnosis not present

## 2020-01-29 DIAGNOSIS — R531 Weakness: Secondary | ICD-10-CM

## 2020-01-29 DIAGNOSIS — Z8673 Personal history of transient ischemic attack (TIA), and cerebral infarction without residual deficits: Secondary | ICD-10-CM

## 2020-01-29 DIAGNOSIS — Z8679 Personal history of other diseases of the circulatory system: Secondary | ICD-10-CM

## 2020-01-29 NOTE — Progress Notes (Signed)
This visit type was conducted due to national recommendations for restrictions regarding the COVID-19 pandemic in an effort to limit this patient's exposure and mitigate transmission in our community.   Virtual Visit via Video Note  I connected with Robert Rowe on 01/29/20 at  4:30 PM EST by a video enabled telemedicine application and verified that I am speaking with the correct person using two identifiers.  Location patient: home Location provider:work or home office Persons participating in the virtual visit: patient, provider, patient's wife, and son-Robert Rowe  I discussed the limitations of evaluation and management by telemedicine and the availability of in person appointments. The patient expressed understanding and agreed to proceed.   HPI:  [name] has history of cerebrovascular disease, rheumatoid arthritis, hypothyroidism, GERD, hypertension.  He had stroke last year and has been basically homebound and predominantly bedbound over the past year.  He has had extensive home physical therapy and family called recently try to get this extended.  There was question of whether he was getting additional benefit though family was convinced he was clearly benefiting.    He had significant right hemiparesis following his stroke and they had seen some strengthening of the right side both arm and leg.  He had progressed to walking with his walker about 40 feet 4-6 times per day.  They would like to extend his home physical therapy if possible.  Yesterday he had episode of decreased responsiveness.  EMS was called out.  His vital signs were stable.  Symptoms lasted about an hour and a half.  He was moving extremities well.  There was no evidence for seizure.  No facial droop.  No new weakness.  No swallowing difficulties.  No complaints of dysuria.  No fever.  No cough.  Seems back to baseline today.  He did not take any sedative hypnotic medications yesterday.   ROS: See pertinent positives and  negatives per HPI.  Past Medical History:  Diagnosis Date  . Acute cholecystitis 12/2018  . BENIGN PROSTATIC HYPERTROPHY 04/05/2007  . 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 CHEST DISCOMFORT 04/25/2009  . Chronic diastolic CHF (congestive heart failure) ([place])    a. Echo 12/16: mild LVH, EF 55-60%, no RWMA, Gr 1 DD, mild AI, MAC, mild LAE, normal RVF, PASP 37 mmHg  . Chronic kidney disease    chronic   . DJD (degenerative joint disease)   . Dysrhythmia    pvc's   . GERD 12/03/2007  . Hiatal hernia   . History of nuclear stress test    Myoview 1/17: EF 53%, normal perfusion; Low Risk  . HYPERLIPIDEMIA 04/05/2007  . Hypothyroidism   . Osteoarth NOS-Unspec 04/05/2007  . Palpitations 02/12/2010  . PEDAL EDEMA 05/01/2008  . Rheumatoid arthritis(714.0) 04/05/2007  . Shortness of breath dyspnea   . TRANSIENT ISCHEMIC ATTACK, HX OF 04/05/2007   ?     Past Surgical History:  Procedure Laterality Date  . BALLOON DILATION N/A 03/12/2013   Procedure: BALLOON DILATION;  Surgeon: Inda Castle, MD;  Location: [facility];  Service: Endoscopy;  Laterality: N/A;  . CARPAL TUNNEL RELEASE  11/28/2018   Left hand  . CATARACT EXTRACTION  2001   bilateral  . CYSTOSCOPY WITH URETHRAL DILATATION N/A 09/24/2015   Procedure: CYSTOSCOPY WITH COOK BALLOON DILATATION OF URETHRAL STRICTURE ;  Surgeon: Carolan Clines, MD;  Location: [facility];  Service: Urology;  Laterality: N/A;  . CYSTOSCOPY WITH URETHRAL DILATATION N/A 07/20/2016   Procedure: CYSTOSCOPY  WITH URETHRAL DILATATION;  Surgeon: Alexis Frock, MD;  Location: [facility];  Service: Urology;  Laterality: N/A;  1 HOUR  . ESOPHAGOGASTRODUODENOSCOPY N/A 03/12/2013   Procedure: ESOPHAGOGASTRODUODENOSCOPY (EGD);  Surgeon: Inda Castle, MD;  Location: [facility];  Service: Endoscopy;  Laterality: N/A;  . HEMORRHOID SURGERY    . HERNIA REPAIR     inguinal x5  . KNEE SURGERY  arthroscopic   left  . PROSTATE CRYOABLATION     2000  .  SHOULDER SURGERY  few years ago   left  . TRANSURETHRAL RESECTION OF PROSTATE N/A 03/19/2015   Procedure: TRANSURETHRAL RESECTION OF THE PROSTATE (TURP);  Surgeon: Carolan Clines, MD;  Location: [facility];  Service: Urology;  Laterality: N/A;    Family History  Problem Relation Age of Onset  . Breast cancer Mother   . Heart disease Father   . Heart attack Father   . Lymphoma Brother   . Stroke Neg Hx   . Hypertension Neg Hx     SOCIAL HX: lives at home with wife.  Retired Engineer, drilling.   Current Outpatient Medications:  .  albuterol (VENTOLIN HFA) 108 (90 Base) MCG/ACT inhaler, Inhale 2 puffs into the lungs every 6 (six) hours as needed for wheezing (cough)., Disp: 18 g, Rfl: 0 .  aspirin EC 81 MG tablet, Take 81 mg by mouth daily., Disp: , Rfl:  .  atorvastatin (LIPITOR) 40 MG tablet, Take 1 tablet (40 mg total) by mouth daily at 6 PM., Disp: 90 tablet, Rfl: 3 .  clopidogrel (PLAVIX) 75 MG tablet, Take 1 tablet (75 mg total) by mouth daily., Disp: 90 tablet, Rfl: 3 .  latanoprost (XALATAN) 0.005 % ophthalmic solution, Place 1 drop into both eyes at bedtime. For Glaucoma, Disp: , Rfl: 99 .  levothyroxine (SYNTHROID) 50 MCG tablet, TAKE 1 TABLET BY MOUTH  DAILY, Disp: 90 tablet, Rfl: 0 .  magnesium hydroxide (MILK OF MAGNESIA) 400 MG/5ML suspension, Constipation (1 of 4 ) If no BM in 3 days, give 30 cc Milk of Magnesium p.o. x 1 dose in 24 hours as needed ( Do  Not use standing constipation orders for residents with renal failure CFR less than 30. Contact MD for orders), Disp: , Rfl:  .  meclizine (ANTIVERT) 12.5 MG tablet, Take 1 tablet (12.5 mg total) by mouth 3 (three) times daily as needed for dizziness., Disp: 30 tablet, Rfl: 0 .  metoprolol succinate (TOPROL-XL) 25 MG 24 hr tablet, TAKE 1 TABLET BY MOUTH AT  BEDTIME, Disp: 90 tablet, Rfl: 1 .  Multiple Vitamin (MULTIVITAMIN WITH MINERALS) TABS tablet, Take 1 tablet by mouth daily with breakfast. , Disp: , Rfl:  .  nitroGLYCERIN  (NITROSTAT) 0.4 MG SL tablet, Place 1 tablet (0.4 mg total) under the tongue every 5 (five) minutes as needed (for Esophageal spasms)., Disp: 30 tablet, Rfl: 1 .  pantoprazole (PROTONIX) 40 MG tablet, Take 1 tablet (40 mg total) by mouth daily., Disp: 90 tablet, Rfl: 3 .  predniSONE (DELTASONE) 10 MG tablet, Taper as  Follows: 4-4-4-3-3-2-2, Disp: 22 tablet, Rfl: 0 .  senna-docusate (SENOKOT-S) 8.6-50 MG tablet, Take 1 tablet by mouth at bedtime as needed for moderate constipation., Disp:  , Rfl:  .  triamcinolone cream (KENALOG) 0.1 %, APPLY TO AFFECTED RASH TWICE A DAY AS NEEDED, Disp: 15 g, Rfl: 1  EXAM:  VITALS per patient if applicable:  GENERAL: alert, oriented, appears well and in no acute distress  HEENT: atraumatic, conjunttiva clear, no obvious abnormalities on  inspection of external nose and ears  NECK: normal movements of the head and neck  LUNGS: on inspection no signs of respiratory distress, breathing rate appears normal, no obvious gross SOB, gasping or wheezing  CV: no obvious cyanosis  MS: moves all visible extremities without noticeable abnormality  PSYCH/NEURO: pleasant and cooperative, no obvious depression or anxiety, speech and thought processing grossly intact  ASSESSMENT AND PLAN:  Discussed the following assessment and plan:  #1 cerebrovascular disease with prior CVA and right hemiparesis.  Neurologically his symptoms are stable  -Family requesting extension of physical therapy.  We will look into seeing if we can get insurance to cover  #2 episode of decreased responsiveness yesterday.  Etiology not clear.  He has not had any fever or other symptoms to suggest active infection.  Questionable TIA.  No evidence for obvious seizure     I discussed the assessment and treatment plan with the patient. The patient was provided an opportunity to ask questions and all were answered. The patient agreed with the plan and demonstrated an understanding of the  instructions.   The patient was advised to call back or seek an in-person evaluation if the symptoms worsen or if the condition fails to improve as anticipated.    Carolann Littler, MD

## 2020-01-29 NOTE — Telephone Encounter (Signed)
Called patient and spoke to his wife. I have set up a Doxy at 4:30pm today on the sons telephone. I will call patient back to confirm that this time will work for the son to be there as wife will check.

## 2020-01-31 ENCOUNTER — Other Ambulatory Visit: Payer: Self-pay | Admitting: Family Medicine

## 2020-01-31 NOTE — Telephone Encounter (Signed)
Refill okay?  

## 2020-01-31 NOTE — Telephone Encounter (Signed)
Rx done. 

## 2020-02-17 DIAGNOSIS — I639 Cerebral infarction, unspecified: Secondary | ICD-10-CM | POA: Diagnosis not present

## 2020-02-17 DIAGNOSIS — Z515 Encounter for palliative care: Secondary | ICD-10-CM | POA: Diagnosis not present

## 2020-02-17 DIAGNOSIS — R1310 Dysphagia, unspecified: Secondary | ICD-10-CM | POA: Diagnosis not present

## 2020-02-17 DIAGNOSIS — I5032 Chronic diastolic (congestive) heart failure: Secondary | ICD-10-CM | POA: Diagnosis not present

## 2020-02-24 ENCOUNTER — Telehealth: Payer: Self-pay | Admitting: Family Medicine

## 2020-02-24 NOTE — Telephone Encounter (Signed)
Pt's spouse, Soyla Murphy, would like a call back from [facility] CMA. She states she needs to get information about some paperwork regarding his Physical Therapy from [facility]. She is not sure if this paperwork has been sent to them or not and would like clarity on it.   [place] can be reached at [phone]

## 2020-02-24 NOTE — Telephone Encounter (Signed)
Spoke with pts wife and stated we do not have paperwork from [place]

## 2020-02-24 NOTE — Telephone Encounter (Signed)
Tried to call pt back got hung up on before talking do not see paperwork

## 2020-02-24 NOTE — Telephone Encounter (Signed)
Pt's son, Ladarius Seubert, stated that his health insurance has not received the PT forms he left with [facility] at his father's last visit. He is needing those filled out and sent in so he can continue PT.   Informed pt that he is not on the DPR and that they will have to follow up with who is listed or the pt. He understood.

## 2020-02-24 NOTE — Telephone Encounter (Signed)
No  I do not remember receiving any forms.

## 2020-02-24 NOTE — Telephone Encounter (Signed)
Pt wife Annmarie called back. She said something happened with phone before. She would like a call back.

## 2020-02-24 NOTE — Telephone Encounter (Signed)
Please advise do you have forms?

## 2020-02-25 NOTE — Telephone Encounter (Signed)
Spoke with pts wife and told her we never received forms going to have pts son print them off and drop them off

## 2020-02-28 DIAGNOSIS — R269 Unspecified abnormalities of gait and mobility: Secondary | ICD-10-CM | POA: Diagnosis not present

## 2020-02-28 DIAGNOSIS — G459 Transient cerebral ischemic attack, unspecified: Secondary | ICD-10-CM | POA: Diagnosis not present

## 2020-02-28 DIAGNOSIS — I639 Cerebral infarction, unspecified: Secondary | ICD-10-CM | POA: Diagnosis not present

## 2020-02-28 DIAGNOSIS — I69398 Other sequelae of cerebral infarction: Secondary | ICD-10-CM | POA: Diagnosis not present

## 2020-02-28 DIAGNOSIS — Z8673 Personal history of transient ischemic attack (TIA), and cerebral infarction without residual deficits: Secondary | ICD-10-CM | POA: Diagnosis not present

## 2020-03-04 ENCOUNTER — Other Ambulatory Visit: Payer: Self-pay | Admitting: Family Medicine

## 2020-03-04 DIAGNOSIS — R531 Weakness: Secondary | ICD-10-CM

## 2020-03-04 NOTE — Progress Notes (Signed)
Patient's family requesting extension of physical therapy through [facility] care.  We will set up referral.  He had stroke last year and has shown progress and family would like to continue home physical therapy.  He does have difficulty getting out because of needing significant amount of assistance with transfers

## 2020-03-09 ENCOUNTER — Other Ambulatory Visit: Payer: Self-pay | Admitting: Family Medicine

## 2020-03-11 ENCOUNTER — Telehealth: Payer: Self-pay | Admitting: Family Medicine

## 2020-03-11 ENCOUNTER — Other Ambulatory Visit: Payer: Self-pay

## 2020-03-11 ENCOUNTER — Telehealth (INDEPENDENT_AMBULATORY_CARE_PROVIDER_SITE_OTHER): Payer: Medicare Other | Admitting: Family Medicine

## 2020-03-11 DIAGNOSIS — L03115 Cellulitis of right lower limb: Secondary | ICD-10-CM | POA: Diagnosis not present

## 2020-03-11 DIAGNOSIS — L97919 Non-pressure chronic ulcer of unspecified part of right lower leg with unspecified severity: Secondary | ICD-10-CM

## 2020-03-11 MED ORDER — CEPHALEXIN 500 MG PO CAPS: 500.0000 mg | ORAL_CAPSULE | Freq: Three times a day (TID) | ORAL | 0 refills | Status: DC

## 2020-03-11 NOTE — Telephone Encounter (Signed)
Appt was made unable to do virtual

## 2020-03-11 NOTE — Progress Notes (Signed)
Patient ID: Robert Rowe, male   DOB: March 20, 1918, 86 y.o.   MRN: [id number]  This visit type was conducted due to national recommendations for restrictions regarding the COVID-19 pandemic in an effort to limit this patient's exposure and mitigate transmission in our community.   Virtual Visit via Telephone Note  I connected with Robert Rowe on 03/11/20 at  5:45 PM EDT by telephone and verified that I am speaking with the correct person using two identifiers.   I discussed the limitations, risks, security and privacy concerns of performing an evaluation and management service by telephone and the availability of in person appointments. I also discussed with the patient that there may be a patient responsible charge related to this service. The patient expressed understanding and agreed to proceed.  Location patient: home Location provider: work or home office Participants present for the call: patient, provider, and patient's wife.  Patient has cognitive impairment and we discussed situation mostly with his wife. Patient did not have a visit in the prior 7 days to address this/these issue(s).   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  He is actually shown some good progress recently with increased appetite and increased strength.  They are engaged in regular physical therapy.  About 3 weeks ago they noted ulcer right posterior leg.  He rests his legs frequently on a cushion and they try to keep pressure off his heels because of heel pain.  They relate ulcer is approximately quarter of an inch in size but has about a 1 to 2 cm surrounding zone of erythema.  Family called with concerns for some infection.  He does little bit of oozing from the wound that looked a little yellow.  No odor.  No necrosis.  They have had home health nursing previously but not currently.  We had recently ordered some home physical therapy  He has some cognitive impairment at baseline but  stable.  No recent agitation.    Past Medical History:  Diagnosis Date  . Acute cholecystitis 12/2018  . BENIGN PROSTATIC HYPERTROPHY 04/05/2007  . 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 CHEST DISCOMFORT 04/25/2009  . Chronic diastolic CHF (congestive heart failure) ([place])    a. Echo 12/16: mild LVH, EF 55-60%, no RWMA, Gr 1 DD, mild AI, MAC, mild LAE, normal RVF, PASP 37 mmHg  . Chronic kidney disease    chronic   . DJD (degenerative joint disease)   . Dysrhythmia    pvc's   . GERD 12/03/2007  . Hiatal hernia   . History of nuclear stress test    Myoview 1/17: EF 53%, normal perfusion; Low Risk  . HYPERLIPIDEMIA 04/05/2007  . Hypothyroidism   . Osteoarth NOS-Unspec 04/05/2007  . Palpitations 02/12/2010  . PEDAL EDEMA 05/01/2008  . Rheumatoid arthritis(714.0) 04/05/2007  . Shortness of breath dyspnea   . TRANSIENT ISCHEMIC ATTACK, HX OF 04/05/2007   ?    Past Surgical History:  Procedure Laterality Date  . BALLOON DILATION N/A 03/12/2013   Procedure: BALLOON DILATION;  Surgeon: Inda Castle, MD;  Location: [facility];  Service: Endoscopy;  Laterality: N/A;  . CARPAL TUNNEL RELEASE  11/28/2018   Left hand  . CATARACT EXTRACTION  2001   bilateral  . CYSTOSCOPY WITH URETHRAL DILATATION N/A 09/24/2015   Procedure: CYSTOSCOPY WITH COOK BALLOON DILATATION OF URETHRAL STRICTURE ;  Surgeon: Carolan Clines, MD;  Location: [facility];  Service: Urology;  Laterality:  N/A;  . CYSTOSCOPY WITH URETHRAL DILATATION N/A 07/20/2016   Procedure: CYSTOSCOPY WITH URETHRAL DILATATION;  Surgeon: Alexis Frock, MD;  Location: [facility];  Service: Urology;  Laterality: N/A;  1 HOUR  . ESOPHAGOGASTRODUODENOSCOPY N/A 03/12/2013   Procedure: ESOPHAGOGASTRODUODENOSCOPY (EGD);  Surgeon: Inda Castle, MD;  Location: [facility];  Service: Endoscopy;  Laterality: N/A;  . HEMORRHOID SURGERY    . HERNIA REPAIR     inguinal x5  . KNEE SURGERY  arthroscopic   left  . PROSTATE CRYOABLATION      2000  . SHOULDER SURGERY  few years ago   left  . TRANSURETHRAL RESECTION OF PROSTATE N/A 03/19/2015   Procedure: TRANSURETHRAL RESECTION OF THE PROSTATE (TURP);  Surgeon: Carolan Clines, MD;  Location: [facility];  Service: Urology;  Laterality: N/A;    reports that he quit smoking about 66 years ago. His smoking use included cigarettes. He has never used smokeless tobacco. He reports current alcohol use. He reports that he does not use drugs. family history includes Breast cancer in his mother; Heart attack in his father; Heart disease in his father; Lymphoma in his brother. Allergies  Allergen Reactions  . Codeine Other (See Comments)    Patient was "all over the place"- undesired side effect  . Sulfasalazine Other (See Comments)    Reaction not recalled  . Sulfonamide Derivatives Other (See Comments)    Reaction not recalled      Observations/Objective: Patient sounds cheerful and well on the phone. I do not appreciate any SOB. Speech and thought processing are grossly intact. Patient reported vitals:  Assessment and Plan:  Reported right posterior leg ulcer with by description some surrounding cellulitis changes  -Keflex 500 mg 3 times daily for 7 days -Set up home health nursing visit for wound evaluation -continue home PT for general strengthening.   Follow Up Instructions:  - as above.   99441 5-10 99442 11-20 99443 21-30 I did not refer this patient for an OV in the next 24 hours for this/these issue(s).  I discussed the assessment and treatment plan with the patient. The patient was provided an opportunity to ask questions and all were answered. The patient agreed with the plan and demonstrated an understanding of the instructions.   The patient was advised to call back or seek an in-person evaluation if the symptoms worsen or if the condition fails to improve as anticipated.  I provided 25 minutes of non-face-to-face time during this encounter.   Carolann Littler, MD

## 2020-03-11 NOTE — Telephone Encounter (Signed)
Left message with home health lady to have Robert Rowe to call back to set up a video visit if able

## 2020-03-11 NOTE — Telephone Encounter (Signed)
Error

## 2020-03-11 NOTE — Telephone Encounter (Signed)
Pt called back, but will have wife call back to set up appt.

## 2020-03-11 NOTE — Telephone Encounter (Signed)
Pt wife states that pt sores on back of left leg. Physical therapsit has been putting neosporin on it but states they think it needs and antibiotic. Pt wife also asks if insurance [employer] medicare form has been done online for physical therapy . Please advise . Advised pt might need a visit but she says pt has hard time walking

## 2020-03-11 NOTE — Telephone Encounter (Signed)
Pt wife would like a call back from [place], pt has a sore on the back of his that is infected. She is requesting an antibiotic that he could take orally.

## 2020-03-11 NOTE — Telephone Encounter (Signed)
Is there any way we can set up virtual?  I would be able to see him after our last visit today virtually if they are able to unblock the schedule for that

## 2020-03-12 DIAGNOSIS — I10 Essential (primary) hypertension: Secondary | ICD-10-CM | POA: Diagnosis not present

## 2020-03-12 DIAGNOSIS — E7849 Other hyperlipidemia: Secondary | ICD-10-CM | POA: Diagnosis not present

## 2020-03-12 DIAGNOSIS — L089 Local infection of the skin and subcutaneous tissue, unspecified: Secondary | ICD-10-CM | POA: Diagnosis not present

## 2020-03-12 DIAGNOSIS — I639 Cerebral infarction, unspecified: Secondary | ICD-10-CM | POA: Diagnosis not present

## 2020-03-12 DIAGNOSIS — S81801S Unspecified open wound, right lower leg, sequela: Secondary | ICD-10-CM | POA: Diagnosis not present

## 2020-03-12 NOTE — Telephone Encounter (Signed)
Patient's son would like for [place] to send someone out to the house to look at the patients leg.  Doctors making house calls can out to the house to look at his leg.   Robert Rowe would like for a nurse to start coming to the house to help with the patient.  [place] (son)   Please advise

## 2020-03-13 DIAGNOSIS — I10 Essential (primary) hypertension: Secondary | ICD-10-CM | POA: Diagnosis not present

## 2020-03-13 DIAGNOSIS — I6389 Other cerebral infarction: Secondary | ICD-10-CM | POA: Diagnosis not present

## 2020-03-13 DIAGNOSIS — E7800 Pure hypercholesterolemia, unspecified: Secondary | ICD-10-CM | POA: Diagnosis not present

## 2020-03-13 DIAGNOSIS — S81801A Unspecified open wound, right lower leg, initial encounter: Secondary | ICD-10-CM | POA: Diagnosis not present

## 2020-03-13 NOTE — Telephone Encounter (Signed)
Nothing else needed

## 2020-03-13 NOTE — Telephone Encounter (Signed)
We have already made referral to have home health nurse wound care specialist come out look at his leg

## 2020-03-13 NOTE — Telephone Encounter (Signed)
Please advise 

## 2020-03-14 ENCOUNTER — Other Ambulatory Visit: Payer: Self-pay | Admitting: Family Medicine

## 2020-03-23 ENCOUNTER — Telehealth: Payer: Self-pay | Admitting: Family Medicine

## 2020-03-23 NOTE — Chronic Care Management (AMB) (Signed)
°  Chronic Care Management   Note  03/23/2020 Name: Robert Rowe MRN: [id number] DOB: 1917/12/31  Robert Rowe is a 86 y.o. year old male who is a primary care patient of Burchette, Alinda Sierras, MD. I reached out to Robert Rowe by phone today in response to a referral sent by Robert Rowe PCP, [name], MD.   [name] was given information about Chronic Care Management services today including:  1. CCM service includes personalized support from designated clinical staff supervised by his physician, including individualized plan of care and coordination with other care providers 2. 24/7 contact phone numbers for assistance for urgent and routine care needs. 3. Service will only be billed when office clinical staff spend 20 minutes or more in a month to coordinate care. 4. Only one practitioner may furnish and bill the service in a calendar month. 5. The patient may stop CCM services at any time (effective at the end of the month) by phone call to the office staff.   Patient agreed to services and verbal consent obtained.   Follow up plan:   [place]

## 2020-03-23 NOTE — Progress Notes (Signed)
  Chronic Care Management   Note  03/23/2020 Name: Robert Rowe MRN: [id number] DOB: May 22, 1918  Robert Rowe is a 86 y.o. year old male who is a primary care patient of Burchette, Alinda Sierras, MD. I reached out to [name] by phone today in response to a referral sent by Robert Rowe PCP, [name], MD.   Robert Rowe was given information about Chronic Care Management services today including:  1. CCM service includes personalized support from designated clinical staff supervised by his physician, including individualized plan of care and coordination with other care providers 2. 24/7 contact phone numbers for assistance for urgent and routine care needs. 3. Service will only be billed when office clinical staff spend 20 minutes or more in a month to coordinate care. 4. Only one practitioner may furnish and bill the service in a calendar month. 5. The patient may stop CCM services at any time (effective at the end of the month) by phone call to the office staff.   Patient agreed to services and verbal consent obtained.   Follow up plan:   [place]

## 2020-04-01 ENCOUNTER — Telehealth: Payer: Medicare Other

## 2020-04-06 ENCOUNTER — Other Ambulatory Visit: Payer: Self-pay

## 2020-04-06 DIAGNOSIS — I10 Essential (primary) hypertension: Secondary | ICD-10-CM

## 2020-04-06 DIAGNOSIS — E039 Hypothyroidism, unspecified: Secondary | ICD-10-CM

## 2020-04-06 NOTE — Chronic Care Management (AMB) (Deleted)
Chronic Care Management Pharmacy  Name: Robert Rowe  MRN: [id number] DOB: 04-30-1918  Chief Complaint/ HPI  Robert Rowe,  86 y.o. , male presents for their {Initial/Follow-up:3041532} CCM visit with the clinical pharmacist {CHL HP Upstream Pharm visit [facility]:[id number]}.  PCP : [name], MD  Their chronic conditions include: HTN, chronic diastolic CHF, CVA, GERD, dyslipidemia,  *** 5/3 consent  Office Visits: 03/11/20 - Posterior right leg ulcer. Started on keflex 500 mg TID for 7 days and home health nurse. No other changes with current meds. Patient is also recovering well from previous stroke last year.  Consult Visit: None  Medications: Outpatient Encounter Medications as of 04/09/2020  Medication Sig  . albuterol (VENTOLIN HFA) 108 (90 Base) MCG/ACT inhaler Inhale 2 puffs into the lungs every 6 (six) hours as needed for wheezing (cough).  Marland Kitchen aspirin EC 81 MG tablet Take 81 mg by mouth daily.  Marland Kitchen atorvastatin (LIPITOR) 40 MG tablet Take 1 tablet (40 mg total) by mouth daily at 6 PM.  . cephALEXin (KEFLEX) 500 MG capsule Take 1 capsule (500 mg total) by mouth 3 (three) times daily.  . clopidogrel (PLAVIX) 75 MG tablet Take 1 tablet (75 mg total) by mouth daily.  Marland Kitchen latanoprost (XALATAN) 0.005 % ophthalmic solution Place 1 drop into both eyes at bedtime. For Glaucoma  . levothyroxine (SYNTHROID) 50 MCG tablet TAKE 1 TABLET BY MOUTH  DAILY  . magnesium hydroxide (MILK OF MAGNESIA) 400 MG/5ML suspension Constipation (1 of 4 ) If no BM in 3 days, give 30 cc Milk of Magnesium p.o. x 1 dose in 24 hours as needed ( Do  Not use standing constipation orders for residents with renal failure CFR less than 30. Contact MD for orders)  . meclizine (ANTIVERT) 12.5 MG tablet Take 1 tablet (12.5 mg total) by mouth 3 (three) times daily as needed for dizziness.  . metoprolol succinate (TOPROL-XL) 25 MG 24 hr tablet TAKE 1 TABLET BY MOUTH AT  BEDTIME  . Multiple Vitamin (MULTIVITAMIN WITH  MINERALS) TABS tablet Take 1 tablet by mouth daily with breakfast.   . nitroGLYCERIN (NITROSTAT) 0.4 MG SL tablet Place 1 tablet (0.4 mg total) under the tongue every 5 (five) minutes as needed (for Esophageal spasms).  . pantoprazole (PROTONIX) 40 MG tablet Take 1 tablet (40 mg total) by mouth daily.  Marland Kitchen senna-docusate (SENOKOT-S) 8.6-50 MG tablet Take 1 tablet by mouth at bedtime as needed for moderate constipation.  . triamcinolone cream (KENALOG) 0.1 % APPLY TO AFFECTED RASH TWICE A DAY AS NEEDED   No facility-administered encounter medications on file as of 04/09/2020.     Current Diagnosis/Assessment:  Goals Addressed   None     Heart Failure   Type: {type of heart failure:30421350}  Last ejection fraction: *** NYHA Class: {CHL HP Upstream Pharm NYHA [phone]} AHA HF Stage: {CHL HP Upstream Pharm AHA HF Stage:[id number]}  Patient has failed these meds in past: *** Patient is currently {CHL Controlled/[phone]} on the following medications:   Metoprolol succinate 25 mg 1 tablet daily  We discussed {CHL HP Upstream Pharmacy [phone]}  Plan  Continue {CHL HP Upstream Pharmacy [phone]} ,  Hypertension   BP today is:  {CHL HP UPSTREAM Pharmacist BP ranges:[phone]}  Office blood pressures are  BP Readings from Last 3 Encounters:  05/20/19 (!) 147/80  01/21/19 99/67  01/17/19 (!) 141/73    Patient has failed these meds in the past: *** Patient is current controlled/uncontrolled*** on:  Metoprolol succinate 25  mg 1 tablet daily Patient checks BP at home {CHL HP BP Monitoring Frequency:[phone]}  Patient home BP readings are ranging: ***  We discussed {CHL HP Upstream Pharmacy discussion:[phone]}  Plan  Continue {CHL HP Upstream Pharmacy XBLTJ:[id number]}   Hyperlipidemia   Lipid Panel     Component Value Date/Time   CHOL 203 (H) 05/13/2019 0632   TRIG 226 (H) 05/13/2019 0632   HDL 29 (L) 05/13/2019  0632   CHOLHDL 7.0 05/13/2019 0632   VLDL 45 (H) 05/13/2019 0632   LDLCALC 129 (H) 05/13/2019 0632   LDLDIRECT 175.0 03/24/2016 1328     The ASCVD Risk score (Goff DC Jr., et al., 2013) failed to calculate for the following reasons:   The 2013 ASCVD risk score is only valid for ages 72 to 82   The patient has a prior MI or stroke diagnosis   Patient has failed these meds in past: *** Patient is currently {CHL Controlled/Uncontrolled:[phone]} on the following medications:   Atorvastatin 40 mg 1 tablet daily at 6 PM  We discussed:  {CHL HP Upstream Pharmacy discussion:[phone]}  Plan  Continue {CHL HP Upstream Pharmacy [phone]}   Hypothyroidism   TSH  Date Value Ref Range Status  12/31/2018 2.124 0.350 - 4.500 uIU/mL Final    Comment:    Performed by a 3rd Generation assay with a functional sensitivity of <=0.01 uIU/mL. Performed at [facility], [place] [address], [place], [place] [address]      Patient has failed these meds in past: *** Patient is currently {CHL Controlled/Uncontrolled:[phone]} on the following medications:   Levothyroxine 50 mcg 1 tablet daily  We discussed:  {CHL HP Upstream Pharmacy discussion:[phone]}  Plan  Continue {CHL HP Upstream Pharmacy [phone]}  GERD    Patient has failed these meds in past: *** Patient is currently {CHL Controlled/Uncontrolled:[phone]} on the following medications:   Pantoprazole 40 mg 1 tablet daily  Nitrostat 0.4 mg SL 1 tablet under the tongue every 5 mins PRN for esophageal spasms  We discussed:  {CHL HP Upstream Pharmacy discussion:[phone]}  Plan   Continue {CHL HP Upstream Pharmacy [phone]}  CVA   Patient has failed these meds in past: *** Patient is currently {CHL Controlled/Uncontrolled:[phone]} on the following medications:   Aspirin EC 81 mg 1 tablet daily  Clopidogrel 75 mg 1 tablet daily  We discussed:  ***  Plan  Continue {CHL HP  Upstream Pharmacy [phone]}  OTCs/Health Maintenance   Patient is currently {CHL Controlled/Uncontrolled:[phone]} on the following medications:   Albuterol HFA 2 puffs into the lungs every 6 hrs PRN for wheezing/cough  Cephalexin 500 mg 1 capsule TID ***  Latanoprost 0.005% opth soln 1 drop into both eyes at bedtime for glaucoma  Milk of Magnesia 400 mg/5 mL susp  If no BM for 3 days, give  30 mLs for 1 dose in 24 hrs PRN  Meclizine 12.5 mg 1 tablet TID PRN for dizziness  Multivitamins 1 tablet daily  Senna-S 1 tablet HS PRN for moderate constipation  Triamcinolone 0.1% cream to rash BID PRN  We discussed:  ***  Plan  Continue {CHL HP Upstream Pharmacy [id number]}

## 2020-04-06 NOTE — Telephone Encounter (Signed)
Referral has been placed. 

## 2020-04-08 ENCOUNTER — Telehealth (INDEPENDENT_AMBULATORY_CARE_PROVIDER_SITE_OTHER): Payer: Medicare Other | Admitting: Family Medicine

## 2020-04-08 ENCOUNTER — Other Ambulatory Visit: Payer: Self-pay

## 2020-04-08 DIAGNOSIS — E039 Hypothyroidism, unspecified: Secondary | ICD-10-CM | POA: Diagnosis not present

## 2020-04-08 DIAGNOSIS — I5032 Chronic diastolic (congestive) heart failure: Secondary | ICD-10-CM

## 2020-04-08 DIAGNOSIS — N183 Chronic kidney disease, stage 3 unspecified: Secondary | ICD-10-CM

## 2020-04-08 DIAGNOSIS — Z8673 Personal history of transient ischemic attack (TIA), and cerebral infarction without residual deficits: Secondary | ICD-10-CM

## 2020-04-08 DIAGNOSIS — S81801D Unspecified open wound, right lower leg, subsequent encounter: Secondary | ICD-10-CM

## 2020-04-08 DIAGNOSIS — K219 Gastro-esophageal reflux disease without esophagitis: Secondary | ICD-10-CM

## 2020-04-08 NOTE — Progress Notes (Signed)
Patient ID: Robert Rowe, male   DOB: [birth year], 86 y.o.   MRN: [id number]  This visit type was conducted due to national recommendations for restrictions regarding the COVID-19 pandemic in an effort to limit this patient's exposure and mitigate transmission in our community.   Virtual Visit via Video Note  I connected with Robert Rowe on 04/08/20 at  7:00 AM EDT by a video enabled telemedicine application and verified that I am speaking with the correct person using two identifiers.  Location patient: home Location provider:work or home office Persons participating in the virtual visit: patient, provider  I discussed the limitations of evaluation and management by telemedicine and the availability of in person appointments. The patient expressed understanding and agreed to proceed.   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  This call was completed with assistance of his son [name] and his wife.  He is generally doing fairly well but has developed some wounds on his right lower leg that are superficial.  Family had requested home health nurse which we thought was very reasonable.  He has difficulties getting out because of his prior stroke.  He has difficulties with ambulation.  He has right hemiparesis from the stroke which occurred last year.  Family felt he was making good progress with physical therapy and they had requested further home therapy.  We made referral for physical therapy on the 14th and for home health wound care assessment on the 21st but family has still not heard back.  His other chronic problems include hypertension, history of diastolic heart failure, GERD, chronic kidney disease, dyslipidemia.  He had lipids checked last summer.  He has not had TSH in over a year.  He also needs follow-up renal profile.  He does not have any significant leg edema.  No orthopnea.  Appetite is fair.  No recent fevers.   Current medications reviewed and include levothyroxine, Protonix, metoprolol, and Lipitor as well as Plavix.  Compliant with medications  Past Medical History:  Diagnosis Date  . Acute cholecystitis 12/2018  . BENIGN PROSTATIC HYPERTROPHY 04/05/2007  . 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 CHEST DISCOMFORT 04/25/2009  . Chronic diastolic CHF (congestive heart failure) ([place])    a. Echo 12/16: mild LVH, EF 55-60%, no RWMA, Gr 1 DD, mild AI, MAC, mild LAE, normal RVF, PASP 37 mmHg  . Chronic kidney disease    chronic   . DJD (degenerative joint disease)   . Dysrhythmia    pvc's   . GERD 12/03/2007  . Hiatal hernia   . History of nuclear stress test    Myoview 1/17: EF 53%, normal perfusion; Low Risk  . HYPERLIPIDEMIA 04/05/2007  . Hypothyroidism   . Osteoarth NOS-Unspec 04/05/2007  . Palpitations 02/12/2010  . PEDAL EDEMA 05/01/2008  . Rheumatoid arthritis(714.0) 04/05/2007  . Shortness of breath dyspnea   . TRANSIENT ISCHEMIC ATTACK, HX OF 04/05/2007   ?    Past Surgical History:  Procedure Laterality Date  . BALLOON DILATION N/A 03/12/2013   Procedure: BALLOON DILATION;  Surgeon: Inda Castle, MD;  Location: [facility];  Service: Endoscopy;  Laterality: N/A;  . CARPAL TUNNEL RELEASE  11/28/2018   Left hand  . CATARACT EXTRACTION  2001   bilateral  . CYSTOSCOPY WITH URETHRAL DILATATION N/A 09/24/2015   Procedure: CYSTOSCOPY WITH COOK BALLOON DILATATION OF URETHRAL STRICTURE ;  Surgeon: Carolan Clines, MD;  Location:  [facility];  Service: Urology;  Laterality: N/A;  . CYSTOSCOPY WITH URETHRAL DILATATION N/A 07/20/2016   Procedure: CYSTOSCOPY WITH URETHRAL DILATATION;  Surgeon: Alexis Frock, MD;  Location: [facility];  Service: Urology;  Laterality: N/A;  1 HOUR  . ESOPHAGOGASTRODUODENOSCOPY N/A 03/12/2013   Procedure: ESOPHAGOGASTRODUODENOSCOPY (EGD);  Surgeon: Inda Castle, MD;  Location: [facility];  Service: Endoscopy;  Laterality: N/A;  . HEMORRHOID SURGERY     . HERNIA REPAIR     inguinal x5  . KNEE SURGERY  arthroscopic   left  . PROSTATE CRYOABLATION     2000  . SHOULDER SURGERY  few years ago   left  . TRANSURETHRAL RESECTION OF PROSTATE N/A 03/19/2015   Procedure: TRANSURETHRAL RESECTION OF THE PROSTATE (TURP);  Surgeon: Carolan Clines, MD;  Location: [facility];  Service: Urology;  Laterality: N/A;    reports that he quit smoking about 66 years ago. His smoking use included cigarettes. He has never used smokeless tobacco. He reports current alcohol use. He reports that he does not use drugs. family history includes Breast cancer in his mother; Heart attack in his father; Heart disease in his father; Lymphoma in his brother. Allergies  Allergen Reactions  . Codeine Other (See Comments)    Patient was "all over the place"- undesired side effect  . Sulfasalazine Other (See Comments)    Reaction not recalled  . Sulfonamide Derivatives Other (See Comments)    Reaction not recalled      ROS: See pertinent positives and negatives per HPI.  Past Medical History:  Diagnosis Date  . Acute cholecystitis 12/2018  . BENIGN PROSTATIC HYPERTROPHY 04/05/2007  . 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 CHEST DISCOMFORT 04/25/2009  . Chronic diastolic CHF (congestive heart failure) ([place])    a. Echo 12/16: mild LVH, EF 55-60%, no RWMA, Gr 1 DD, mild AI, MAC, mild LAE, normal RVF, PASP 37 mmHg  . Chronic kidney disease    chronic   . DJD (degenerative joint disease)   . Dysrhythmia    pvc's   . GERD 12/03/2007  . Hiatal hernia   . History of nuclear stress test    Myoview 1/17: EF 53%, normal perfusion; Low Risk  . HYPERLIPIDEMIA 04/05/2007  . Hypothyroidism   . Osteoarth NOS-Unspec 04/05/2007  . Palpitations 02/12/2010  . PEDAL EDEMA 05/01/2008  . Rheumatoid arthritis(714.0) 04/05/2007  . Shortness of breath dyspnea   . TRANSIENT ISCHEMIC ATTACK, HX OF 04/05/2007   ?     Past Surgical History:  Procedure Laterality  Date  . BALLOON DILATION N/A 03/12/2013   Procedure: BALLOON DILATION;  Surgeon: Inda Castle, MD;  Location: [facility];  Service: Endoscopy;  Laterality: N/A;  . CARPAL TUNNEL RELEASE  11/28/2018   Left hand  . CATARACT EXTRACTION  2001   bilateral  . CYSTOSCOPY WITH URETHRAL DILATATION N/A 09/24/2015   Procedure: CYSTOSCOPY WITH COOK BALLOON DILATATION OF URETHRAL STRICTURE ;  Surgeon: Carolan Clines, MD;  Location: [facility];  Service: Urology;  Laterality: N/A;  . CYSTOSCOPY WITH URETHRAL DILATATION N/A 07/20/2016   Procedure: CYSTOSCOPY WITH URETHRAL DILATATION;  Surgeon: Alexis Frock, MD;  Location: [facility];  Service: Urology;  Laterality: N/A;  1 HOUR  . ESOPHAGOGASTRODUODENOSCOPY N/A 03/12/2013   Procedure: ESOPHAGOGASTRODUODENOSCOPY (EGD);  Surgeon: Inda Castle, MD;  Location: [facility];  Service: Endoscopy;  Laterality: N/A;  . HEMORRHOID SURGERY    . HERNIA REPAIR     inguinal x5  .  KNEE SURGERY  arthroscopic   left  . PROSTATE CRYOABLATION     2000  . SHOULDER SURGERY  few years ago   left  . TRANSURETHRAL RESECTION OF PROSTATE N/A 03/19/2015   Procedure: TRANSURETHRAL RESECTION OF THE PROSTATE (TURP);  Surgeon: Carolan Clines, MD;  Location: [facility];  Service: Urology;  Laterality: N/A;    Family History  Problem Relation Age of Onset  . Breast cancer Mother   . Heart disease Father   . Heart attack Father   . Lymphoma Brother   . Stroke Neg Hx   . Hypertension Neg Hx     SOCIAL HX: Non-smoker.  Lives with wife.  Retired physician   Current Outpatient Medications:  .  albuterol (VENTOLIN HFA) 108 (90 Base) MCG/ACT inhaler, Inhale 2 puffs into the lungs every 6 (six) hours as needed for wheezing (cough)., Disp: 18 g, Rfl: 0 .  aspirin EC 81 MG tablet, Take 81 mg by mouth daily., Disp: , Rfl:  .  atorvastatin (LIPITOR) 40 MG tablet, Take 1 tablet (40 mg total) by mouth daily at 6 PM., Disp: 90 tablet, Rfl: 3 .  cephALEXin (KEFLEX) 500 MG capsule,  Take 1 capsule (500 mg total) by mouth 3 (three) times daily., Disp: 21 capsule, Rfl: 0 .  clopidogrel (PLAVIX) 75 MG tablet, Take 1 tablet (75 mg total) by mouth daily., Disp: 90 tablet, Rfl: 3 .  latanoprost (XALATAN) 0.005 % ophthalmic solution, Place 1 drop into both eyes at bedtime. For Glaucoma, Disp: , Rfl: 99 .  levothyroxine (SYNTHROID) 50 MCG tablet, TAKE 1 TABLET BY MOUTH  DAILY, Disp: 90 tablet, Rfl: 2 .  magnesium hydroxide (MILK OF MAGNESIA) 400 MG/5ML suspension, Constipation (1 of 4 ) If no BM in 3 days, give 30 cc Milk of Magnesium p.o. x 1 dose in 24 hours as needed ( Do  Not use standing constipation orders for residents with renal failure CFR less than 30. Contact MD for orders), Disp: , Rfl:  .  meclizine (ANTIVERT) 12.5 MG tablet, Take 1 tablet (12.5 mg total) by mouth 3 (three) times daily as needed for dizziness., Disp: 30 tablet, Rfl: 0 .  metoprolol succinate (TOPROL-XL) 25 MG 24 hr tablet, TAKE 1 TABLET BY MOUTH AT  BEDTIME, Disp: 90 tablet, Rfl: 1 .  Multiple Vitamin (MULTIVITAMIN WITH MINERALS) TABS tablet, Take 1 tablet by mouth daily with breakfast. , Disp: , Rfl:  .  nitroGLYCERIN (NITROSTAT) 0.4 MG SL tablet, Place 1 tablet (0.4 mg total) under the tongue every 5 (five) minutes as needed (for Esophageal spasms)., Disp: 30 tablet, Rfl: 1 .  pantoprazole (PROTONIX) 40 MG tablet, Take 1 tablet (40 mg total) by mouth daily., Disp: 90 tablet, Rfl: 3 .  senna-docusate (SENOKOT-S) 8.6-50 MG tablet, Take 1 tablet by mouth at bedtime as needed for moderate constipation., Disp:  , Rfl:  .  triamcinolone cream (KENALOG) 0.1 %, APPLY TO AFFECTED RASH TWICE A DAY AS NEEDED, Disp: 30 g, Rfl: 0  EXAM:  VITALS per patient if applicable:  GENERAL: alert, oriented, appears well and in no acute distress  HEENT: atraumatic, conjunttiva clear, no obvious abnormalities on inspection of external nose and ears  NECK: normal movements of the head and neck  LUNGS: on inspection no signs  of respiratory distress, breathing rate appears normal, no obvious gross SOB, gasping or wheezing  CV: no obvious cyanosis  MS: moves all visible extremities without noticeable abnormality  PSYCH/NEURO: pleasant and cooperative, no obvious depression or  anxiety, speech and thought processing grossly intact  ASSESSMENT AND PLAN:  Discussed the following assessment and plan:  #1 history of cerebrovascular disease with prior CVA and right hemiparesis -Ongoing needs for home physical therapy and we are in process of trying to get this set up again  #2 hypothyroidism.  Overdue for follow-up labs -We will see about getting home health to draw labs on him including TSH and basic metabolic panel when we can get them established  #3 right leg wounds.  In need of home health assessment.  Setting up referral as above  #4 hyperlipidemia.  Patient on Lipitor.  Lipids reviewed from last summer     I discussed the assessment and treatment plan with the patient. The patient was provided an opportunity to ask questions and all were answered. The patient agreed with the plan and demonstrated an understanding of the instructions.   The patient was advised to call back or seek an in-person evaluation if the symptoms worsen or if the condition fails to improve as anticipated.    Carolann Littler, MD

## 2020-04-09 ENCOUNTER — Telehealth: Payer: Medicare Other | Admitting: Pharmacist

## 2020-04-13 ENCOUNTER — Other Ambulatory Visit: Payer: Self-pay | Admitting: Family Medicine

## 2020-04-16 ENCOUNTER — Other Ambulatory Visit: Payer: Self-pay | Admitting: *Deleted

## 2020-04-16 ENCOUNTER — Telehealth: Payer: Self-pay | Admitting: Family Medicine

## 2020-04-16 DIAGNOSIS — E039 Hypothyroidism, unspecified: Secondary | ICD-10-CM | POA: Diagnosis not present

## 2020-04-16 DIAGNOSIS — S81801D Unspecified open wound, right lower leg, subsequent encounter: Secondary | ICD-10-CM

## 2020-04-16 DIAGNOSIS — L02415 Cutaneous abscess of right lower limb: Secondary | ICD-10-CM | POA: Diagnosis not present

## 2020-04-16 DIAGNOSIS — K219 Gastro-esophageal reflux disease without esophagitis: Secondary | ICD-10-CM | POA: Diagnosis not present

## 2020-04-16 DIAGNOSIS — I509 Heart failure, unspecified: Secondary | ICD-10-CM | POA: Diagnosis not present

## 2020-04-16 NOTE — Telephone Encounter (Signed)
Transferred pt's son to Triage Nurse because he mentioned pt could have a possible blood clot.

## 2020-04-17 ENCOUNTER — Other Ambulatory Visit: Payer: Self-pay | Admitting: Family Medicine

## 2020-04-29 ENCOUNTER — Encounter: Payer: Self-pay | Admitting: General Practice

## 2020-04-30 DIAGNOSIS — K5909 Other constipation: Secondary | ICD-10-CM | POA: Diagnosis not present

## 2020-04-30 DIAGNOSIS — N189 Chronic kidney disease, unspecified: Secondary | ICD-10-CM | POA: Diagnosis not present

## 2020-04-30 DIAGNOSIS — L02415 Cutaneous abscess of right lower limb: Secondary | ICD-10-CM | POA: Diagnosis not present

## 2020-05-11 ENCOUNTER — Other Ambulatory Visit: Payer: Self-pay | Admitting: Family Medicine

## 2020-05-20 ENCOUNTER — Telehealth: Payer: Self-pay | Admitting: Family Medicine

## 2020-05-20 NOTE — Telephone Encounter (Signed)
Pts spouse if calling in to see if there is a service that would be willing to go out to the house to check his L ear not able to hear clearly out of it.  Spouse would like to be called and on either phone if you are not able to get one please try the other one for her verified #'s are correct.  Pt is paralyzed and unable to get out.

## 2020-05-20 NOTE — Telephone Encounter (Signed)
Please advise can not think of any places

## 2020-05-21 NOTE — Telephone Encounter (Signed)
I am not aware of any companies that come out..  most likely source of problem would be cerumen impaction.  If not having any pain, they could try OTC wax softener such as Cerumenex, Debrox, or mineral oil (leave in at least 30 minutes) followed by gentle irrigation with luke warm ater and rubber bulb syringe.  This might require a few days to see results.

## 2020-05-22 NOTE — Telephone Encounter (Signed)
Pt wife states that they have appt with dr.fry on Tuesday

## 2020-05-26 ENCOUNTER — Encounter: Payer: Self-pay | Admitting: Family Medicine

## 2020-05-26 ENCOUNTER — Ambulatory Visit (INDEPENDENT_AMBULATORY_CARE_PROVIDER_SITE_OTHER): Payer: Medicare Other | Admitting: Family Medicine

## 2020-05-26 ENCOUNTER — Ambulatory Visit: Payer: Medicare Other | Admitting: Adult Health

## 2020-05-26 ENCOUNTER — Other Ambulatory Visit: Payer: Self-pay

## 2020-05-26 VITALS — HR 72 | Temp 97.9°F | Ht 62.0 in

## 2020-05-26 DIAGNOSIS — L03115 Cellulitis of right lower limb: Secondary | ICD-10-CM

## 2020-05-26 DIAGNOSIS — H9192 Unspecified hearing loss, left ear: Secondary | ICD-10-CM | POA: Diagnosis not present

## 2020-05-26 DIAGNOSIS — B372 Candidiasis of skin and nail: Secondary | ICD-10-CM

## 2020-05-26 MED ORDER — KETOCONAZOLE 2 % EX CREA: 1.0000 "application " | TOPICAL_CREAM | Freq: Two times a day (BID) | CUTANEOUS | 0 refills | Status: DC | PRN

## 2020-05-26 MED ORDER — CEPHALEXIN 500 MG PO CAPS: 500.0000 mg | ORAL_CAPSULE | Freq: Three times a day (TID) | ORAL | 0 refills | Status: AC

## 2020-05-26 NOTE — Progress Notes (Signed)
   Subjective:    Patient ID: Robert Rowe, male    DOB: 05-31-1918, 86 y.o.   MRN: [id number]  HPI Here with his wife and caregiver for several issues. First he has a tender spot on the right calf that has been irritated for a week. He took Keflex for this in April and it seemed to calm down for awhile. No drainage or fever. Second he had some hearing loss in the left ear over the weekend but now it is back to normal. No ringing or pain. Third he has had some irritation in the right armpit for 2 weeks.    Review of Systems  Constitutional: Negative.   HENT: Positive for hearing loss.   Respiratory: Negative.   Cardiovascular: Negative.   Skin: Positive for rash.       Objective:   Physical Exam Constitutional:      Comments: In a wheelchair   HENT:     Right Ear: Tympanic membrane, ear canal and external ear normal.     Left Ear: Tympanic membrane, ear canal and external ear normal.  Cardiovascular:     Rate and Rhythm: Normal rate and regular rhythm.     Pulses: Normal pulses.     Heart sounds: Normal heart sounds.  Pulmonary:     Effort: Pulmonary effort is normal.     Breath sounds: Normal breath sounds.  Musculoskeletal:     Comments: The right calf has an area of erythema, warmth, and tenderness   Skin:    Comments: The right axilla has macular erythema   Neurological:     Mental Status: He is alert.           Assessment & Plan:  Brief hearing loss, likely due to eustachian tube dysfunction. This seems to be back to baseline now. For the leg cellulitis, treat with Keflex for 10 days. For the tinea in the axilla, treat with Ketoconazole cream.  Alysia Penna, MD

## 2020-06-03 ENCOUNTER — Other Ambulatory Visit: Payer: Self-pay | Admitting: Family Medicine

## 2020-06-08 ENCOUNTER — Other Ambulatory Visit: Payer: Self-pay | Admitting: Family Medicine

## 2020-06-09 ENCOUNTER — Other Ambulatory Visit: Payer: Self-pay | Admitting: Family Medicine

## 2020-06-11 ENCOUNTER — Other Ambulatory Visit: Payer: Self-pay | Admitting: Family Medicine

## 2020-06-12 ENCOUNTER — Other Ambulatory Visit: Payer: Self-pay | Admitting: Family Medicine

## 2020-06-18 DIAGNOSIS — D229 Melanocytic nevi, unspecified: Secondary | ICD-10-CM | POA: Diagnosis not present

## 2020-06-18 DIAGNOSIS — L814 Other melanin hyperpigmentation: Secondary | ICD-10-CM | POA: Diagnosis not present

## 2020-06-18 DIAGNOSIS — C4442 Squamous cell carcinoma of skin of scalp and neck: Secondary | ICD-10-CM | POA: Diagnosis not present

## 2020-06-18 DIAGNOSIS — D485 Neoplasm of uncertain behavior of skin: Secondary | ICD-10-CM | POA: Diagnosis not present

## 2020-06-18 DIAGNOSIS — L821 Other seborrheic keratosis: Secondary | ICD-10-CM | POA: Diagnosis not present

## 2020-06-18 DIAGNOSIS — L570 Actinic keratosis: Secondary | ICD-10-CM | POA: Diagnosis not present

## 2020-07-07 ENCOUNTER — Other Ambulatory Visit: Payer: Self-pay | Admitting: Family Medicine

## 2020-07-12 ENCOUNTER — Other Ambulatory Visit: Payer: Self-pay | Admitting: Family Medicine

## 2020-07-17 ENCOUNTER — Other Ambulatory Visit: Payer: Self-pay | Admitting: Family Medicine

## 2020-07-17 MED ORDER — TRIAMCINOLONE ACETONIDE 0.1 % EX CREA
TOPICAL_CREAM | CUTANEOUS | 2 refills | Status: DC
Start: 2020-07-17 — End: 2020-10-14

## 2020-07-28 ENCOUNTER — Other Ambulatory Visit: Payer: Self-pay | Admitting: Family Medicine

## 2020-08-24 ENCOUNTER — Telehealth: Payer: Self-pay

## 2020-08-24 ENCOUNTER — Other Ambulatory Visit: Payer: Self-pay

## 2020-08-24 MED ORDER — ALBUTEROL SULFATE HFA 108 (90 BASE) MCG/ACT IN AERS
INHALATION_SPRAY | RESPIRATORY_TRACT | 2 refills | Status: DC
Start: 2020-08-24 — End: 2021-07-27

## 2020-08-24 NOTE — Telephone Encounter (Signed)
  LAST APPOINTMENT DATE: 07/28/2020   NEXT APPOINTMENT DATE:@Visit  date not found  MEDICATION: PROAIR HFA 108 (90 Base) MCG/ACT inhaler  PHARMACY: [id number] - [place], [place] - [address]  COMMENTS: Wife states Robert Rowe has been wheezing a bit more often and ran out quick.

## 2020-08-24 NOTE — Telephone Encounter (Signed)
Medication has been sent to the pharmacy requested. °

## 2020-08-25 ENCOUNTER — Telehealth: Payer: Self-pay

## 2020-08-25 ENCOUNTER — Telehealth: Payer: Self-pay | Admitting: Family Medicine

## 2020-08-25 DIAGNOSIS — L814 Other melanin hyperpigmentation: Secondary | ICD-10-CM | POA: Diagnosis not present

## 2020-08-25 DIAGNOSIS — L821 Other seborrheic keratosis: Secondary | ICD-10-CM | POA: Diagnosis not present

## 2020-08-25 DIAGNOSIS — Z85828 Personal history of other malignant neoplasm of skin: Secondary | ICD-10-CM | POA: Diagnosis not present

## 2020-08-25 DIAGNOSIS — L905 Scar conditions and fibrosis of skin: Secondary | ICD-10-CM | POA: Diagnosis not present

## 2020-08-25 DIAGNOSIS — R0600 Dyspnea, unspecified: Secondary | ICD-10-CM

## 2020-08-25 NOTE — Telephone Encounter (Signed)
Please see message. °

## 2020-08-25 NOTE — Telephone Encounter (Signed)
Called [name] and was not able to leave a voice message due to no voice mail available. I wanted to let her know that Dr. Elease Hashimoto is working on the PT and nurse eval. Also wanted to see how Robert Rowe is feeling?

## 2020-08-25 NOTE — Telephone Encounter (Signed)
Called patient and spoke to [facility] and she stated that she just picked up the inhaler and gave Mr. Lipke some and he is sleeping. No fever and No edema.  Annemarie wants to know if you can put in an order for the [facility] nurse to come out and check Mr. Cerda for his breathing, etc.  She also wants PT and wanted to know if you can send again for this year?

## 2020-08-25 NOTE — Telephone Encounter (Signed)
Try the inhaler first to see if that helps.  See if he is having any increased leg edema and make sure no fever

## 2020-08-25 NOTE — Telephone Encounter (Signed)
Nurse patty states she triaged patient -- she could hear him wheezing from across the room on the phone.  Patient and wife is refusing ED.  The wife stated they would be home after lunch if someone wanted to call them. The inhaler that was called into the pharmacy has not been picked up yet also.

## 2020-08-26 NOTE — Telephone Encounter (Signed)
I have placed order for home PT.

## 2020-08-26 NOTE — Telephone Encounter (Signed)
I LMOVM that order has been placed and he should be hearing from them to schedule/thx dmf

## 2020-09-01 ENCOUNTER — Encounter: Payer: Self-pay | Admitting: Family Medicine

## 2020-09-01 ENCOUNTER — Telehealth (INDEPENDENT_AMBULATORY_CARE_PROVIDER_SITE_OTHER): Payer: Medicare Other | Admitting: Family Medicine

## 2020-09-01 DIAGNOSIS — R059 Cough, unspecified: Secondary | ICD-10-CM | POA: Diagnosis not present

## 2020-09-01 MED ORDER — DOXYCYCLINE HYCLATE 100 MG PO TABS: 100.0000 mg | ORAL_TABLET | Freq: Two times a day (BID) | ORAL | 0 refills | Status: DC

## 2020-09-01 NOTE — Telephone Encounter (Signed)
Son called states pt is coughing up yellow & not feeling well since their last call to [place]. Son request antibiotic called in. Pt uses [facility]. Contact son [phone] or An Lelan Pons [phone].

## 2020-09-01 NOTE — Progress Notes (Signed)
Virtual Visit via Video Note  I connected with Rhythm  on 09/01/20 at  4:40 PM EDT by a video enabled telemedicine application and verified that I am speaking with the correct person using two identifiers.  Location patient: home, [place] Location provider:work or home office Persons participating in the virtual visit: patient, provider, son, wife  I discussed the limitations of evaluation and management by telemedicine and the availability of in person appointments. The patient expressed understanding and agreed to proceed.   HPI:  Acute telemedicine visit for cough: -Onset: ~ 2 weeks ago -reports treated with alb inh prn 1 week ago -now son and wife worried is bacterial and want abx, patient agrees to this, patient prior internist -Symptoms include: cough, congestion, yellow sputum, some wheezing - alb helps some -Denies: fevers, sob, CP, NVD, sinus pain -Has tried: alb a few times per day - helps a little -Pertinent past medical history: denies hx of asthma or COPD -Pertinent medication allergies: sulfa, codeine -COVID-19 vaccine status:fully vaccinated with pfizer  ROS: See pertinent positives and negatives per HPI.  Past Medical History:  Diagnosis Date  . Acute cholecystitis 12/2018  . BENIGN PROSTATIC HYPERTROPHY 04/05/2007  . 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 CHEST DISCOMFORT 04/25/2009  . Chronic diastolic CHF (congestive heart failure) ([place])    a. Echo 12/16: mild LVH, EF 55-60%, no RWMA, Gr 1 DD, mild AI, MAC, mild LAE, normal RVF, PASP 37 mmHg  . Chronic kidney disease    chronic   . DJD (degenerative joint disease)   . Dysrhythmia    pvc's   . GERD 12/03/2007  . Hiatal hernia   . History of nuclear stress test    Myoview 1/17: EF 53%, normal perfusion; Low Risk  . HYPERLIPIDEMIA 04/05/2007  . Hypothyroidism   . Osteoarth NOS-Unspec 04/05/2007  . Palpitations 02/12/2010  . PEDAL EDEMA 05/01/2008  . Rheumatoid arthritis(714.0) 04/05/2007  .  Shortness of breath dyspnea   . TRANSIENT ISCHEMIC ATTACK, HX OF 04/05/2007   ?     Past Surgical History:  Procedure Laterality Date  . BALLOON DILATION N/A 03/12/2013   Procedure: BALLOON DILATION;  Surgeon: Inda Castle, MD;  Location: [facility];  Service: Endoscopy;  Laterality: N/A;  . CARPAL TUNNEL RELEASE  11/28/2018   Left hand  . CATARACT EXTRACTION  2001   bilateral  . CYSTOSCOPY WITH URETHRAL DILATATION N/A 09/24/2015   Procedure: CYSTOSCOPY WITH COOK BALLOON DILATATION OF URETHRAL STRICTURE ;  Surgeon: Carolan Clines, MD;  Location: [facility];  Service: Urology;  Laterality: N/A;  . CYSTOSCOPY WITH URETHRAL DILATATION N/A 07/20/2016   Procedure: CYSTOSCOPY WITH URETHRAL DILATATION;  Surgeon: Alexis Frock, MD;  Location[facility];  Service: Urology;  Laterality: N/A;  1 HOUR  . ESOPHAGOGASTRODUODENOSCOPY N/A 03/12/2013   Procedure: ESOPHAGOGASTRODUODENOSCOPY (EGD);  Surgeon: Inda Castle, MD;  Location: [facility];  Service: Endoscopy;  Laterality: N/A;  . HEMORRHOID SURGERY    . HERNIA REPAIR     inguinal x5  . KNEE SURGERY  arthroscopic   left  . PROSTATE CRYOABLATION     2000  . SHOULDER SURGERY  few years ago   left  . TRANSURETHRAL RESECTION OF PROSTATE N/A 03/19/2015   Procedure: TRANSURETHRAL RESECTION OF THE PROSTATE (TURP);  Surgeon: Carolan Clines, MD;  Location: [facility];  Service: Urology;  Laterality: N/A;     Current Outpatient Medications:  .  albuterol (PROAIR HFA) 108 (90 Base) MCG/ACT inhaler, INHALE 2  PUFFS INTO THE LUNGS EVERY 6 (SIX) HOURS AS NEEDED FOR WHEEZING (COUGH)., Disp: 8.5 g, Rfl: 2 .  aspirin EC 81 MG tablet, Take 81 mg by mouth daily., Disp: , Rfl:  .  atorvastatin (LIPITOR) 40 MG tablet, TAKE 1 TABLET (40 MG TOTAL) BY MOUTH DAILY AT 6 PM., Disp: 90 tablet, Rfl: 0 .  clopidogrel (PLAVIX) 75 MG tablet, TAKE 1 TABLET BY MOUTH  DAILY, Disp: 90 tablet, Rfl: 3 .  ketoconazole (NIZORAL) 2 % cream, APPLY 1 APPLICATION TOPICALLY 2 (TWO)  TIMES DAILY AS NEEDED FOR IRRITATION., Disp: 30 g, Rfl: 0 .  latanoprost (XALATAN) 0.005 % ophthalmic solution, Place 1 drop into both eyes at bedtime. For Glaucoma, Disp: , Rfl: 99 .  levothyroxine (SYNTHROID) 50 MCG tablet, TAKE 1 TABLET BY MOUTH  DAILY, Disp: 90 tablet, Rfl: 2 .  magnesium hydroxide (MILK OF MAGNESIA) 400 MG/5ML suspension, Constipation (1 of 4 ) If no BM in 3 days, give 30 cc Milk of Magnesium p.o. x 1 dose in 24 hours as needed ( Do  Not use standing constipation orders for residents with renal failure CFR less than 30. Contact MD for orders), Disp: , Rfl:  .  meclizine (ANTIVERT) 12.5 MG tablet, Take 1 tablet (12.5 mg total) by mouth 3 (three) times daily as needed for dizziness., Disp: 30 tablet, Rfl: 0 .  metoprolol succinate (TOPROL-XL) 25 MG 24 hr tablet, TAKE 1 TABLET BY MOUTH AT  BEDTIME, Disp: 90 tablet, Rfl: 3 .  Multiple Vitamin (MULTIVITAMIN WITH MINERALS) TABS tablet, Take 1 tablet by mouth daily with breakfast. , Disp: , Rfl:  .  nitroGLYCERIN (NITROSTAT) 0.4 MG SL tablet, Place 1 tablet (0.4 mg total) under the tongue every 5 (five) minutes as needed (for Esophageal spasms)., Disp: 30 tablet, Rfl: 1 .  pantoprazole (PROTONIX) 40 MG tablet, TAKE 1 TABLET BY MOUTH EVERY DAY, Disp: 90 tablet, Rfl: 3 .  senna-docusate (SENOKOT-S) 8.6-50 MG tablet, Take 1 tablet by mouth at bedtime as needed for moderate constipation., Disp:  , Rfl:  .  triamcinolone cream (KENALOG) 0.1 %, Apply to affected rash twice daily as needed., Disp: 30 g, Rfl: 2 .  doxycycline (VIBRA-TABS) 100 MG tablet, Take 1 tablet (100 mg total) by mouth 2 (two) times daily., Disp: 14 tablet, Rfl: 0  EXAM:  VITALS per patient if applicable:  GENERAL: alert, oriented, appears well and in no acute distress  HEENT: atraumatic, conjunttiva clear, no obvious abnormalities on inspection of external nose and ears  NECK: normal movements of the head and neck  LUNGS: on inspection no signs of respiratory  distress, breathing rate appears normal, no obvious gross SOB, gasping or wheezing  CV: no obvious cyanosis  MS: moves all visible extremities without noticeable abnormality  PSYCH/NEURO: pleasant and cooperative, no obvious depression or anxiety, speech and thought processing grossly intact  ASSESSMENT AND PLAN:  Discussed the following assessment and plan:  Cough  -we discussed possible serious and likely etiologies, options for evaluation and workup, limitations of telemedicine visit vs in person visit, treatment, treatment risks and precautions. Pt prefers to treat via telemedicine empirically rather than in person at this moment.  Query respiratory infection.  Given duration of symptoms and persistent discolored sputum they opted for empiric trial of doxycycline 100 mg twice daily for 7 days.  Declined prednisone.  Continue albuterol as needed per instructions.  They agree to follow-up with PCP in a few days for recheck.  declined Scheduled follow up with PCP  offered: Sent message to schedulers to assist and advised patient to contact PCP office to schedule if does not receive call back in next 24 hours. Advised to seek prompt follow up telemedicine visit or in person care if worsening, new symptoms arise, or if is not improving with treatment. Did let this patient know that I only do telemedicine on Tuesdays and Thursdays for [place]. Advised to schedule follow up visit with PCP or [facility] if any further questions or concerns to avoid delays in care.   I discussed the assessment and treatment plan with the patient. The patient was provided an opportunity to ask questions and all were answered. The patient agreed with the plan and demonstrated an understanding of the instructions.     Lucretia Kern, DO

## 2020-09-01 NOTE — Telephone Encounter (Signed)
Spoke with the pts son and scheduled a virtual visit for today with Dr Maudie Mercury at 4:40pm.

## 2020-09-09 NOTE — Progress Notes (Signed)
Patient is scheduled for a virtual visit with Dr. Elease Hashimoto on 09/11/2020 at 3:15 PM

## 2020-09-11 ENCOUNTER — Telehealth (INDEPENDENT_AMBULATORY_CARE_PROVIDER_SITE_OTHER): Payer: Medicare Other | Admitting: Family Medicine

## 2020-09-11 VITALS — Temp 92.6°F | Ht 62.0 in | Wt 140.0 lb

## 2020-09-11 DIAGNOSIS — R29898 Other symptoms and signs involving the musculoskeletal system: Secondary | ICD-10-CM

## 2020-09-11 DIAGNOSIS — R059 Cough, unspecified: Secondary | ICD-10-CM

## 2020-09-11 DIAGNOSIS — Z8673 Personal history of transient ischemic attack (TIA), and cerebral infarction without residual deficits: Secondary | ICD-10-CM

## 2020-09-11 NOTE — Progress Notes (Signed)
Patient ID: Robert Rowe, male   DOB: August 17, 1918, 86 y.o.   MRN: [id number]  This visit type was conducted due to national recommendations for restrictions regarding the COVID-19 pandemic in an effort to limit this patient's exposure and mitigate transmission in our community.   Virtual Visit via Telephone Note  I connected with Robert Rowe, his son, and his wife on 09/11/20 at  3:15 PM EDT by telephone and verified that I am speaking with the correct person using two identifiers.   I discussed the limitations, risks, security and privacy concerns of performing an evaluation and management service by telephone and the availability of in person appointments. I also discussed with the patient that there may be a patient responsible charge related to this service. The patient expressed understanding and agreed to proceed.  Location patient: home Location provider: work or home office Participants present for the call: patient, provider Patient did not have a visit in the prior 7 days to address this/these issue(s).   History of Present Illness:  Robert Rowe is 86 years old and will turn 86-week.  He had recent cough which started around 1 October.  He had a virtual visit on the 12th.  He was placed on doxycycline for 1 week.  No prednisone.  There is some question of wheezing.  They were using albuterol inhaler.  Family report that he is better at this time.  Cough is essentially resolved.  No wheezing.  No dyspnea.  No fever.  Sleeping okay and appetite is stable.  He had stroke 2020.  He had right hemiparesis.  He had extensive physical therapy and family has been interested in trying to get further therapy.  They felt he was making progress even at the time that his last therapy was going on.  He has been able to resume feeding himself with his right upper extremity.  He is able to ambulate some with a walker but they feel like if he had some further strengthening right lower extremity he would be  able to perhaps transfer better and become more active in the home.  His last therapy was about 5 months ago and they have been unsuccessful in getting approval to get further therapy at home.  Past Medical History:  Diagnosis Date  . Acute cholecystitis 12/2018  . BENIGN PROSTATIC HYPERTROPHY 04/05/2007  . 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 CHEST DISCOMFORT 04/25/2009  . Chronic diastolic CHF (congestive heart failure) ([place])    a. Echo 12/16: mild LVH, EF 55-60%, no RWMA, Gr 1 DD, mild AI, MAC, mild LAE, normal RVF, PASP 37 mmHg  . Chronic kidney disease    chronic   . DJD (degenerative joint disease)   . Dysrhythmia    pvc's   . GERD 12/03/2007  . Hiatal hernia   . History of nuclear stress test    Myoview 1/17: EF 53%, normal perfusion; Low Risk  . HYPERLIPIDEMIA 04/05/2007  . Hypothyroidism   . Osteoarth NOS-Unspec 04/05/2007  . Palpitations 02/12/2010  . PEDAL EDEMA 05/01/2008  . Rheumatoid arthritis(714.0) 04/05/2007  . Shortness of breath dyspnea   . TRANSIENT ISCHEMIC ATTACK, HX OF 04/05/2007   ?    Past Surgical History:  Procedure Laterality Date  . BALLOON DILATION N/A 03/12/2013   Procedure: BALLOON DILATION;  Surgeon: Inda Castle, MD;  Location: [facility];  Service: Endoscopy;  Laterality: N/A;  . CARPAL TUNNEL RELEASE  11/28/2018   Left hand  .  CATARACT EXTRACTION  2001   bilateral  . CYSTOSCOPY WITH URETHRAL DILATATION N/A 09/24/2015   Procedure: CYSTOSCOPY WITH COOK BALLOON DILATATION OF URETHRAL STRICTURE ;  Surgeon: Carolan Clines, MD;  Location: [facility];  Service: Urology;  Laterality: N/A;  . CYSTOSCOPY WITH URETHRAL DILATATION N/A 07/20/2016   Procedure: CYSTOSCOPY WITH URETHRAL DILATATION;  Surgeon: Alexis Frock, MD;  Location: [facility];  Service: Urology;  Laterality: N/A;  1 HOUR  . ESOPHAGOGASTRODUODENOSCOPY N/A 03/12/2013   Procedure: ESOPHAGOGASTRODUODENOSCOPY (EGD);  Surgeon: Inda Castle, MD;  Location: [facility];   Service: Endoscopy;  Laterality: N/A;  . HEMORRHOID SURGERY    . HERNIA REPAIR     inguinal x5  . KNEE SURGERY  arthroscopic   left  . PROSTATE CRYOABLATION     2000  . SHOULDER SURGERY  few years ago   left  . TRANSURETHRAL RESECTION OF PROSTATE N/A 03/19/2015   Procedure: TRANSURETHRAL RESECTION OF THE PROSTATE (TURP);  Surgeon: Carolan Clines, MD;  Location: [facility];  Service: Urology;  Laterality: N/A;    reports that he quit smoking about 67 years ago. His smoking use included cigarettes. He has never used smokeless tobacco. He reports current alcohol use. He reports that he does not use drugs. family history includes Breast cancer in his mother; Heart attack in his father; Heart disease in his father; Lymphoma in his brother. Allergies  Allergen Reactions  . Codeine Other (See Comments)    Patient was "all over the place"- undesired side effect  . Sulfasalazine Other (See Comments)    Reaction not recalled  . Sulfonamide Derivatives Other (See Comments)    Reaction not recalled      Observations/Objective: Patient sounds cheerful and well on the phone. I do not appreciate any SOB. Speech and thought processing are grossly intact. Patient reported vitals:  Assessment and Plan:  #1 recent cough-resolved -Observe for now and be in touch for any recurrent cough for any fever  #2 history of CVA with right hemiparesis.  Persistent right lower extremity weakness  -We agreed to put in referral for home physical therapy to see if we can get him to go back out.  Follow Up Instructions:  -As above   99441 5-10 [id number] 11-20 99443 21-30  I did not refer this patient for an OV in the next 24 hours for this/these issue(s).  I discussed the assessment and treatment plan with the patient. The patient was provided an opportunity to ask questions and all were answered. The patient agreed with the plan and demonstrated an understanding of the instructions.   The patient was  advised to call back or seek an in-person evaluation if the symptoms worsen or if the condition fails to improve as anticipated.  I provided 25 minutes of non-face-to-face time during this encounter.   Carolann Littler, MD

## 2020-10-14 ENCOUNTER — Other Ambulatory Visit: Payer: Self-pay | Admitting: Family Medicine

## 2020-11-05 ENCOUNTER — Other Ambulatory Visit: Payer: Self-pay | Admitting: Family Medicine

## 2020-11-11 ENCOUNTER — Other Ambulatory Visit: Payer: Self-pay | Admitting: Family Medicine

## 2020-11-30 DIAGNOSIS — L905 Scar conditions and fibrosis of skin: Secondary | ICD-10-CM | POA: Diagnosis not present

## 2020-11-30 DIAGNOSIS — Z85828 Personal history of other malignant neoplasm of skin: Secondary | ICD-10-CM | POA: Diagnosis not present

## 2021-01-25 ENCOUNTER — Telehealth: Payer: Self-pay | Admitting: Family Medicine

## 2021-01-25 DIAGNOSIS — R29898 Other symptoms and signs involving the musculoskeletal system: Secondary | ICD-10-CM

## 2021-01-25 NOTE — Telephone Encounter (Signed)
Pts son it calling in needing a referral for PT to [facility] [phone]) and would like for them to go out several times a week to do PT.  Reason for the change in service is due to the other facility not calling or going out.  Pts son would like to have a call back when the referral is placed.

## 2021-01-25 NOTE — Telephone Encounter (Signed)
OK to refer as requested for PT.

## 2021-01-26 NOTE — Telephone Encounter (Signed)
Spoke with the pts son, informed him the referral was placed and home health agencies are behind in scheduling visits at this time.

## 2021-01-26 NOTE — Addendum Note (Signed)
Addended by: Agnes Lawrence on: 01/26/2021 10:38 AM   Modules accepted: Orders

## 2021-01-29 ENCOUNTER — Telehealth: Payer: Self-pay | Admitting: Family Medicine

## 2021-01-29 DIAGNOSIS — N183 Chronic kidney disease, stage 3 unspecified: Secondary | ICD-10-CM | POA: Diagnosis not present

## 2021-01-29 DIAGNOSIS — E039 Hypothyroidism, unspecified: Secondary | ICD-10-CM | POA: Diagnosis not present

## 2021-01-29 DIAGNOSIS — R1312 Dysphagia, oropharyngeal phase: Secondary | ICD-10-CM | POA: Diagnosis not present

## 2021-01-29 DIAGNOSIS — E785 Hyperlipidemia, unspecified: Secondary | ICD-10-CM | POA: Diagnosis not present

## 2021-01-29 DIAGNOSIS — I69351 Hemiplegia and hemiparesis following cerebral infarction affecting right dominant side: Secondary | ICD-10-CM | POA: Diagnosis not present

## 2021-01-29 DIAGNOSIS — I69391 Dysphagia following cerebral infarction: Secondary | ICD-10-CM | POA: Diagnosis not present

## 2021-01-29 DIAGNOSIS — I130 Hypertensive heart and chronic kidney disease with heart failure and stage 1 through stage 4 chronic kidney disease, or unspecified chronic kidney disease: Secondary | ICD-10-CM | POA: Diagnosis not present

## 2021-01-29 DIAGNOSIS — I5032 Chronic diastolic (congestive) heart failure: Secondary | ICD-10-CM | POA: Diagnosis not present

## 2021-01-29 NOTE — Telephone Encounter (Signed)
Called and  left detail voicemail for verbal okay  home PT for patient.

## 2021-01-29 NOTE — Telephone Encounter (Signed)
per [facility] home [facility]  [place] need verbal orders for home health Physical Therapy  for 2 weeks 3 1 week 1 2week2 1 week 1 and also need speech therapy for swallowing; please call  [phone] [facility]

## 2021-02-02 DIAGNOSIS — I69391 Dysphagia following cerebral infarction: Secondary | ICD-10-CM | POA: Diagnosis not present

## 2021-02-02 DIAGNOSIS — E039 Hypothyroidism, unspecified: Secondary | ICD-10-CM | POA: Diagnosis not present

## 2021-02-02 DIAGNOSIS — E785 Hyperlipidemia, unspecified: Secondary | ICD-10-CM | POA: Diagnosis not present

## 2021-02-02 DIAGNOSIS — R1312 Dysphagia, oropharyngeal phase: Secondary | ICD-10-CM | POA: Diagnosis not present

## 2021-02-02 DIAGNOSIS — I5032 Chronic diastolic (congestive) heart failure: Secondary | ICD-10-CM | POA: Diagnosis not present

## 2021-02-02 DIAGNOSIS — N183 Chronic kidney disease, stage 3 unspecified: Secondary | ICD-10-CM | POA: Diagnosis not present

## 2021-02-02 DIAGNOSIS — I69351 Hemiplegia and hemiparesis following cerebral infarction affecting right dominant side: Secondary | ICD-10-CM | POA: Diagnosis not present

## 2021-02-02 DIAGNOSIS — I130 Hypertensive heart and chronic kidney disease with heart failure and stage 1 through stage 4 chronic kidney disease, or unspecified chronic kidney disease: Secondary | ICD-10-CM | POA: Diagnosis not present

## 2021-02-04 DIAGNOSIS — N183 Chronic kidney disease, stage 3 unspecified: Secondary | ICD-10-CM | POA: Diagnosis not present

## 2021-02-04 DIAGNOSIS — R1312 Dysphagia, oropharyngeal phase: Secondary | ICD-10-CM | POA: Diagnosis not present

## 2021-02-04 DIAGNOSIS — E039 Hypothyroidism, unspecified: Secondary | ICD-10-CM | POA: Diagnosis not present

## 2021-02-04 DIAGNOSIS — I69391 Dysphagia following cerebral infarction: Secondary | ICD-10-CM | POA: Diagnosis not present

## 2021-02-04 DIAGNOSIS — E785 Hyperlipidemia, unspecified: Secondary | ICD-10-CM | POA: Diagnosis not present

## 2021-02-04 DIAGNOSIS — I5032 Chronic diastolic (congestive) heart failure: Secondary | ICD-10-CM | POA: Diagnosis not present

## 2021-02-04 DIAGNOSIS — I69351 Hemiplegia and hemiparesis following cerebral infarction affecting right dominant side: Secondary | ICD-10-CM | POA: Diagnosis not present

## 2021-02-04 DIAGNOSIS — I130 Hypertensive heart and chronic kidney disease with heart failure and stage 1 through stage 4 chronic kidney disease, or unspecified chronic kidney disease: Secondary | ICD-10-CM | POA: Diagnosis not present

## 2021-02-08 DIAGNOSIS — I69391 Dysphagia following cerebral infarction: Secondary | ICD-10-CM | POA: Diagnosis not present

## 2021-02-08 DIAGNOSIS — E785 Hyperlipidemia, unspecified: Secondary | ICD-10-CM | POA: Diagnosis not present

## 2021-02-08 DIAGNOSIS — E039 Hypothyroidism, unspecified: Secondary | ICD-10-CM | POA: Diagnosis not present

## 2021-02-08 DIAGNOSIS — I130 Hypertensive heart and chronic kidney disease with heart failure and stage 1 through stage 4 chronic kidney disease, or unspecified chronic kidney disease: Secondary | ICD-10-CM | POA: Diagnosis not present

## 2021-02-08 DIAGNOSIS — N183 Chronic kidney disease, stage 3 unspecified: Secondary | ICD-10-CM | POA: Diagnosis not present

## 2021-02-08 DIAGNOSIS — I69351 Hemiplegia and hemiparesis following cerebral infarction affecting right dominant side: Secondary | ICD-10-CM | POA: Diagnosis not present

## 2021-02-08 DIAGNOSIS — R1312 Dysphagia, oropharyngeal phase: Secondary | ICD-10-CM | POA: Diagnosis not present

## 2021-02-08 DIAGNOSIS — I5032 Chronic diastolic (congestive) heart failure: Secondary | ICD-10-CM | POA: Diagnosis not present

## 2021-02-09 DIAGNOSIS — R1312 Dysphagia, oropharyngeal phase: Secondary | ICD-10-CM | POA: Diagnosis not present

## 2021-02-09 DIAGNOSIS — I130 Hypertensive heart and chronic kidney disease with heart failure and stage 1 through stage 4 chronic kidney disease, or unspecified chronic kidney disease: Secondary | ICD-10-CM | POA: Diagnosis not present

## 2021-02-09 DIAGNOSIS — I69391 Dysphagia following cerebral infarction: Secondary | ICD-10-CM | POA: Diagnosis not present

## 2021-02-09 DIAGNOSIS — I5032 Chronic diastolic (congestive) heart failure: Secondary | ICD-10-CM | POA: Diagnosis not present

## 2021-02-09 DIAGNOSIS — E039 Hypothyroidism, unspecified: Secondary | ICD-10-CM | POA: Diagnosis not present

## 2021-02-09 DIAGNOSIS — E785 Hyperlipidemia, unspecified: Secondary | ICD-10-CM | POA: Diagnosis not present

## 2021-02-09 DIAGNOSIS — I69351 Hemiplegia and hemiparesis following cerebral infarction affecting right dominant side: Secondary | ICD-10-CM | POA: Diagnosis not present

## 2021-02-09 DIAGNOSIS — N183 Chronic kidney disease, stage 3 unspecified: Secondary | ICD-10-CM | POA: Diagnosis not present

## 2021-02-10 DIAGNOSIS — E039 Hypothyroidism, unspecified: Secondary | ICD-10-CM | POA: Diagnosis not present

## 2021-02-10 DIAGNOSIS — I69391 Dysphagia following cerebral infarction: Secondary | ICD-10-CM | POA: Diagnosis not present

## 2021-02-10 DIAGNOSIS — I69351 Hemiplegia and hemiparesis following cerebral infarction affecting right dominant side: Secondary | ICD-10-CM | POA: Diagnosis not present

## 2021-02-10 DIAGNOSIS — R1312 Dysphagia, oropharyngeal phase: Secondary | ICD-10-CM | POA: Diagnosis not present

## 2021-02-10 DIAGNOSIS — I5032 Chronic diastolic (congestive) heart failure: Secondary | ICD-10-CM | POA: Diagnosis not present

## 2021-02-10 DIAGNOSIS — I130 Hypertensive heart and chronic kidney disease with heart failure and stage 1 through stage 4 chronic kidney disease, or unspecified chronic kidney disease: Secondary | ICD-10-CM | POA: Diagnosis not present

## 2021-02-10 DIAGNOSIS — N183 Chronic kidney disease, stage 3 unspecified: Secondary | ICD-10-CM | POA: Diagnosis not present

## 2021-02-10 DIAGNOSIS — E785 Hyperlipidemia, unspecified: Secondary | ICD-10-CM | POA: Diagnosis not present

## 2021-02-15 NOTE — Telephone Encounter (Signed)
Robert Rowe is calling from [facility] to request a verbal order for a modified barium swallow and would like a call back, please advise. CB is [phone]

## 2021-02-15 NOTE — Telephone Encounter (Signed)
OK 

## 2021-02-15 NOTE — Telephone Encounter (Signed)
Spoke with [name]. Verbal orders have been given. Nothing further needed.

## 2021-02-16 ENCOUNTER — Other Ambulatory Visit: Payer: Self-pay | Admitting: Family Medicine

## 2021-02-16 DIAGNOSIS — N183 Chronic kidney disease, stage 3 unspecified: Secondary | ICD-10-CM | POA: Diagnosis not present

## 2021-02-16 DIAGNOSIS — I69391 Dysphagia following cerebral infarction: Secondary | ICD-10-CM | POA: Diagnosis not present

## 2021-02-16 DIAGNOSIS — I69351 Hemiplegia and hemiparesis following cerebral infarction affecting right dominant side: Secondary | ICD-10-CM | POA: Diagnosis not present

## 2021-02-16 DIAGNOSIS — I130 Hypertensive heart and chronic kidney disease with heart failure and stage 1 through stage 4 chronic kidney disease, or unspecified chronic kidney disease: Secondary | ICD-10-CM | POA: Diagnosis not present

## 2021-02-16 DIAGNOSIS — I5032 Chronic diastolic (congestive) heart failure: Secondary | ICD-10-CM | POA: Diagnosis not present

## 2021-02-16 DIAGNOSIS — R1312 Dysphagia, oropharyngeal phase: Secondary | ICD-10-CM | POA: Diagnosis not present

## 2021-02-16 DIAGNOSIS — E039 Hypothyroidism, unspecified: Secondary | ICD-10-CM | POA: Diagnosis not present

## 2021-02-16 DIAGNOSIS — E785 Hyperlipidemia, unspecified: Secondary | ICD-10-CM | POA: Diagnosis not present

## 2021-02-17 DIAGNOSIS — I130 Hypertensive heart and chronic kidney disease with heart failure and stage 1 through stage 4 chronic kidney disease, or unspecified chronic kidney disease: Secondary | ICD-10-CM | POA: Diagnosis not present

## 2021-02-17 DIAGNOSIS — I69351 Hemiplegia and hemiparesis following cerebral infarction affecting right dominant side: Secondary | ICD-10-CM | POA: Diagnosis not present

## 2021-02-17 DIAGNOSIS — I69391 Dysphagia following cerebral infarction: Secondary | ICD-10-CM | POA: Diagnosis not present

## 2021-02-17 DIAGNOSIS — E039 Hypothyroidism, unspecified: Secondary | ICD-10-CM | POA: Diagnosis not present

## 2021-02-17 DIAGNOSIS — N183 Chronic kidney disease, stage 3 unspecified: Secondary | ICD-10-CM | POA: Diagnosis not present

## 2021-02-17 DIAGNOSIS — E785 Hyperlipidemia, unspecified: Secondary | ICD-10-CM | POA: Diagnosis not present

## 2021-02-17 DIAGNOSIS — R1312 Dysphagia, oropharyngeal phase: Secondary | ICD-10-CM | POA: Diagnosis not present

## 2021-02-17 DIAGNOSIS — I5032 Chronic diastolic (congestive) heart failure: Secondary | ICD-10-CM | POA: Diagnosis not present

## 2021-02-23 DIAGNOSIS — E039 Hypothyroidism, unspecified: Secondary | ICD-10-CM | POA: Diagnosis not present

## 2021-02-23 DIAGNOSIS — I5032 Chronic diastolic (congestive) heart failure: Secondary | ICD-10-CM | POA: Diagnosis not present

## 2021-02-23 DIAGNOSIS — I69351 Hemiplegia and hemiparesis following cerebral infarction affecting right dominant side: Secondary | ICD-10-CM | POA: Diagnosis not present

## 2021-02-23 DIAGNOSIS — E785 Hyperlipidemia, unspecified: Secondary | ICD-10-CM | POA: Diagnosis not present

## 2021-02-23 DIAGNOSIS — N183 Chronic kidney disease, stage 3 unspecified: Secondary | ICD-10-CM | POA: Diagnosis not present

## 2021-02-23 DIAGNOSIS — R1312 Dysphagia, oropharyngeal phase: Secondary | ICD-10-CM | POA: Diagnosis not present

## 2021-02-23 DIAGNOSIS — I69391 Dysphagia following cerebral infarction: Secondary | ICD-10-CM | POA: Diagnosis not present

## 2021-02-23 DIAGNOSIS — I130 Hypertensive heart and chronic kidney disease with heart failure and stage 1 through stage 4 chronic kidney disease, or unspecified chronic kidney disease: Secondary | ICD-10-CM | POA: Diagnosis not present

## 2021-02-25 ENCOUNTER — Telehealth: Payer: Self-pay | Admitting: Family Medicine

## 2021-02-25 DIAGNOSIS — I130 Hypertensive heart and chronic kidney disease with heart failure and stage 1 through stage 4 chronic kidney disease, or unspecified chronic kidney disease: Secondary | ICD-10-CM | POA: Diagnosis not present

## 2021-02-25 DIAGNOSIS — N183 Chronic kidney disease, stage 3 unspecified: Secondary | ICD-10-CM | POA: Diagnosis not present

## 2021-02-25 DIAGNOSIS — I69391 Dysphagia following cerebral infarction: Secondary | ICD-10-CM | POA: Diagnosis not present

## 2021-02-25 DIAGNOSIS — E785 Hyperlipidemia, unspecified: Secondary | ICD-10-CM | POA: Diagnosis not present

## 2021-02-25 DIAGNOSIS — I69351 Hemiplegia and hemiparesis following cerebral infarction affecting right dominant side: Secondary | ICD-10-CM | POA: Diagnosis not present

## 2021-02-25 DIAGNOSIS — E039 Hypothyroidism, unspecified: Secondary | ICD-10-CM | POA: Diagnosis not present

## 2021-02-25 DIAGNOSIS — R1312 Dysphagia, oropharyngeal phase: Secondary | ICD-10-CM | POA: Diagnosis not present

## 2021-02-25 DIAGNOSIS — I5032 Chronic diastolic (congestive) heart failure: Secondary | ICD-10-CM | POA: Diagnosis not present

## 2021-02-25 NOTE — Telephone Encounter (Signed)
LMVM for Robert Rowe to contact the office to reschedule his appointment with Dr. Elease Hashimoto for 02/26/2021 because Tammy from [facility] called needing his appointment to be within the 30 days of their first evaluation.  He is scheduled for 03/01/2021 but that is past the 30 day mark

## 2021-02-26 ENCOUNTER — Telehealth: Payer: Self-pay

## 2021-02-26 ENCOUNTER — Telehealth: Payer: Medicare Other | Admitting: Family Medicine

## 2021-02-26 ENCOUNTER — Other Ambulatory Visit: Payer: Self-pay

## 2021-02-26 ENCOUNTER — Telehealth (INDEPENDENT_AMBULATORY_CARE_PROVIDER_SITE_OTHER): Payer: Medicare Other | Admitting: Family Medicine

## 2021-02-26 DIAGNOSIS — E039 Hypothyroidism, unspecified: Secondary | ICD-10-CM | POA: Diagnosis not present

## 2021-02-26 DIAGNOSIS — I10 Essential (primary) hypertension: Secondary | ICD-10-CM | POA: Diagnosis not present

## 2021-02-26 DIAGNOSIS — N183 Chronic kidney disease, stage 3 unspecified: Secondary | ICD-10-CM

## 2021-02-26 DIAGNOSIS — E785 Hyperlipidemia, unspecified: Secondary | ICD-10-CM

## 2021-02-26 DIAGNOSIS — Z8673 Personal history of transient ischemic attack (TIA), and cerebral infarction without residual deficits: Secondary | ICD-10-CM

## 2021-02-26 NOTE — Telephone Encounter (Signed)
Per Dr. Elease Hashimoto  "Robert Rowe is essentially homebound. would you see if can contact someone with [facility] to go out and draw labs- he is way overdue and needs TSH, Lipid, CMP. they already use [facility] for ST and PT"  [name] from [facility] [phone]  LM for vanessa to call back so that verbal orders can be given.

## 2021-02-26 NOTE — Progress Notes (Signed)
Patient ID: [name], male   DOB: 08-11-1918, 87 y.o.   MRN: [id number]   This visit type was conducted due to national recommendations for restrictions regarding the COVID-19 pandemic in an effort to limit this patient's exposure and mitigate transmission in our community.   Virtual Visit via Video Note  I connected with Robert Rowe on 02/26/21 at 11:00 AM EDT by a video enabled telemedicine application and verified that I am speaking with the correct person using two identifiers.  Location patient: home Location provider:work or home office Persons participating in the virtual visit: patient, provider, patient's wife, and patient's son Robert Rowe)  I discussed the limitations of evaluation and management by telemedicine and the availability of in person appointments. The patient expressed understanding and agreed to proceed.   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  His stroke occurred in 2020.  He has other medical problems including history of hypertension, chronic diastolic heart failure, GERD, hypothyroidism, hyperlipidemia, chronic kidney disease stage III  He is currently getting speech therapy and physical therapy and family feels that he is benefiting from both.  He has difficulties swallowing since his stroke and family has noted that he frequently coughs after eating.  He is benefiting from current speech therapy work.  Even though he is not ambulating at this time family have concerns about loss of mobility in his extremities.  Physical therapy has been working with him on that.  He has shown progress and has actually been able to feed himself recently some.  He is able stand briefly with walker at bedside.  No recent reported bedsores.  Medications reviewed and compliant with all.  He is overdue for labs has not had any labs in over 2 years.  They are currently not monitoring blood pressures regularly.  ROS: See pertinent positives and negatives per  HPI.  Past Medical History:  Diagnosis Date  . Acute cholecystitis 12/2018  . BENIGN PROSTATIC HYPERTROPHY 04/05/2007  . 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 CHEST DISCOMFORT 04/25/2009  . Chronic diastolic CHF (congestive heart failure) ([place])    a. Echo 12/16: mild LVH, EF 55-60%, no RWMA, Gr 1 DD, mild AI, MAC, mild LAE, normal RVF, PASP 37 mmHg  . Chronic kidney disease    chronic   . DJD (degenerative joint disease)   . Dysrhythmia    pvc's   . GERD 12/03/2007  . Hiatal hernia   . History of nuclear stress test    Myoview 1/17: EF 53%, normal perfusion; Low Risk  . HYPERLIPIDEMIA 04/05/2007  . Hypothyroidism   . Osteoarth NOS-Unspec 04/05/2007  . Palpitations 02/12/2010  . PEDAL EDEMA 05/01/2008  . Rheumatoid arthritis(714.0) 04/05/2007  . Shortness of breath dyspnea   . TRANSIENT ISCHEMIC ATTACK, HX OF 04/05/2007   ?     Past Surgical History:  Procedure Laterality Date  . BALLOON DILATION N/A 03/12/2013   Procedure: BALLOON DILATION;  Surgeon: Robert Castle, Rowe;  Location: [facility];  Service: Endoscopy;  Laterality: N/A;  . CARPAL TUNNEL RELEASE  11/28/2018   Left hand  . CATARACT EXTRACTION  2001   bilateral  . CYSTOSCOPY WITH URETHRAL DILATATION N/A 09/24/2015   Procedure: CYSTOSCOPY WITH COOK BALLOON DILATATION OF URETHRAL STRICTURE ;  Surgeon: Robert Clines, Rowe;  Location[facility];  Service: Urology;  Laterality: N/A;  . CYSTOSCOPY WITH URETHRAL DILATATION N/A 07/20/2016   Procedure: CYSTOSCOPY WITH URETHRAL DILATATION;  Surgeon: Robert Rowe  Tresa Moore, Rowe;  Location: [facility];  Service: Urology;  Laterality: N/A;  1 HOUR  . ESOPHAGOGASTRODUODENOSCOPY N/A 03/12/2013   Procedure: ESOPHAGOGASTRODUODENOSCOPY (EGD);  Surgeon: Robert Castle, Rowe;  Location: [facility];  Service: Endoscopy;  Laterality: N/A;  . HEMORRHOID SURGERY    . HERNIA REPAIR     inguinal x5  . KNEE SURGERY  arthroscopic   left  . PROSTATE CRYOABLATION     2000  . SHOULDER  SURGERY  few years ago   left  . TRANSURETHRAL RESECTION OF PROSTATE N/A 03/19/2015   Procedure: TRANSURETHRAL RESECTION OF THE PROSTATE (TURP);  Surgeon: Robert Clines, Rowe;  Location: [facility];  Service: Urology;  Laterality: N/A;    Family History  Problem Relation Age of Onset  . Breast cancer Mother   . Heart disease Father   . Heart attack Father   . Lymphoma Brother   . Stroke Neg Hx   . Hypertension Neg Hx     SOCIAL HX: Non-smoker.  Retired Administrator, [employer]   Current Outpatient Medications:  .  albuterol (PROAIR HFA) 108 (90 Base) MCG/ACT inhaler, INHALE 2 PUFFS INTO THE LUNGS EVERY 6 (SIX) HOURS AS NEEDED FOR WHEEZING (COUGH)., Disp: 8.5 g, Rfl: 2 .  aspirin EC 81 MG tablet, Take 81 mg by mouth daily., Disp: , Rfl:  .  atorvastatin (LIPITOR) 40 MG tablet, TAKE 1 TABLET (40 MG TOTAL) BY MOUTH DAILY AT 6 PM., Disp: 90 tablet, Rfl: 3 .  clopidogrel (PLAVIX) 75 MG tablet, TAKE 1 TABLET BY MOUTH  DAILY, Disp: 90 tablet, Rfl: 3 .  ketoconazole (NIZORAL) 2 % cream, APPLY 1 APPLICATION TOPICALLY 2 (TWO) TIMES DAILY AS NEEDED FOR IRRITATION., Disp: 30 g, Rfl: 0 .  latanoprost (XALATAN) 0.005 % ophthalmic solution, Place 1 drop into both eyes at bedtime. For Glaucoma, Disp: , Rfl: 99 .  levothyroxine (SYNTHROID) 50 MCG tablet, TAKE 1 TABLET BY MOUTH  DAILY, Disp: 90 tablet, Rfl: 3 .  magnesium hydroxide (MILK OF MAGNESIA) 400 MG/5ML suspension, Constipation (1 of 4 ) If no BM in 3 days, give 30 cc Milk of Magnesium p.o. x 1 dose in 24 hours as needed ( Do  Not use standing constipation orders for residents with renal failure CFR less than 30. Contact Rowe for orders), Disp: , Rfl:  .  meclizine (ANTIVERT) 12.5 MG tablet, Take 1 tablet (12.5 mg total) by mouth 3 (three) times daily as needed for dizziness., Disp: 30 tablet, Rfl: 0 .  metoprolol succinate (TOPROL-XL) 25 MG 24 hr tablet, TAKE 1 TABLET BY MOUTH AT  BEDTIME, Disp: 90 tablet, Rfl: 3 .  Multiple Vitamin (MULTIVITAMIN WITH MINERALS)  TABS tablet, Take 1 tablet by mouth daily with breakfast. , Disp: , Rfl:  .  nitroGLYCERIN (NITROSTAT) 0.4 MG SL tablet, Place 1 tablet (0.4 mg total) under the tongue every 5 (five) minutes as needed (for Esophageal spasms)., Disp: 30 tablet, Rfl: 1 .  pantoprazole (PROTONIX) 40 MG tablet, TAKE 1 TABLET BY MOUTH EVERY DAY, Disp: 90 tablet, Rfl: 3 .  senna-docusate (SENOKOT-S) 8.6-50 MG tablet, Take 1 tablet by mouth at bedtime as needed for moderate constipation., Disp:  , Rfl:  .  triamcinolone (KENALOG) 0.1 %, APPLY TO AFFECTED RASH TWICE DAILY AS NEEDED., Disp: 30 g, Rfl: 2  EXAM:  VITALS per patient if applicable:  GENERAL: alert, oriented, appears well and in no acute distress  HEENT: atraumatic, conjunttiva clear, no obvious abnormalities on inspection of external nose and ears  NECK: normal  movements of the head and neck  LUNGS: on inspection no signs of respiratory distress, breathing rate appears normal, no obvious gross SOB, gasping or wheezing  CV: no obvious cyanosis  MS: moves all visible extremities without noticeable abnormality  PSYCH/NEURO: pleasant and cooperative, no obvious depression or anxiety, speech and thought processing grossly intact  ASSESSMENT AND PLAN:  Discussed the following assessment and plan:  #1 history of CVA.  Patient has history of right hemiparesis.  He had some swallowing difficulty since his stroke.  He is currently benefiting from home PT and ST and we are recommending that both of these services be continued for now.  #2 hypothyroidism.  He is on replacement with 50 mcg of levothyroxine daily.  Overdue for lab -We will see if we can get home health nurse to go out drawl TSH  #3 hyperlipidemia.  Goal LDL less than 70.  Patient on Lipitor 40 mg daily -Needs follow-up lipid and hepatic panel.  Trying to get home labs as above  #4 chronic kidney disease stage III -Check comprehensive metabolic panel with next follow-up lab     I  discussed the assessment and treatment plan with the patient. The patient was provided an opportunity to ask questions and all were answered. The patient agreed with the plan and demonstrated an understanding of the instructions.   The patient was advised to call back or seek an in-person evaluation if the symptoms worsen or if the condition fails to improve as anticipated.     Carolann Littler, Rowe

## 2021-03-01 ENCOUNTER — Telehealth: Payer: Medicare Other | Admitting: Family Medicine

## 2021-03-01 NOTE — Telephone Encounter (Signed)
Left message for [name] to call back so that verbal orders can be given.Robert Rowe

## 2021-03-02 DIAGNOSIS — I5032 Chronic diastolic (congestive) heart failure: Secondary | ICD-10-CM | POA: Diagnosis not present

## 2021-03-02 DIAGNOSIS — N183 Chronic kidney disease, stage 3 unspecified: Secondary | ICD-10-CM | POA: Diagnosis not present

## 2021-03-02 DIAGNOSIS — E785 Hyperlipidemia, unspecified: Secondary | ICD-10-CM | POA: Diagnosis not present

## 2021-03-02 DIAGNOSIS — E039 Hypothyroidism, unspecified: Secondary | ICD-10-CM | POA: Diagnosis not present

## 2021-03-02 DIAGNOSIS — I69351 Hemiplegia and hemiparesis following cerebral infarction affecting right dominant side: Secondary | ICD-10-CM | POA: Diagnosis not present

## 2021-03-02 DIAGNOSIS — R1312 Dysphagia, oropharyngeal phase: Secondary | ICD-10-CM | POA: Diagnosis not present

## 2021-03-02 DIAGNOSIS — I69391 Dysphagia following cerebral infarction: Secondary | ICD-10-CM | POA: Diagnosis not present

## 2021-03-02 DIAGNOSIS — I130 Hypertensive heart and chronic kidney disease with heart failure and stage 1 through stage 4 chronic kidney disease, or unspecified chronic kidney disease: Secondary | ICD-10-CM | POA: Diagnosis not present

## 2021-03-02 NOTE — Telephone Encounter (Signed)
Spoke with [name]. She stated she will get in touch with the patients nurse and give me a call back.

## 2021-03-02 NOTE — Telephone Encounter (Signed)
Robert Rowe called back stating that [facility] will not send a nurse out just to draw labs.

## 2021-03-02 NOTE — Telephone Encounter (Signed)
Left message for patient to call back  

## 2021-03-02 NOTE — Telephone Encounter (Signed)
Son stated that they could get him here for labs if necessary.  Should probably go ahead and try to schedule office follow-up for 30-minute follow-up at some point in the next month

## 2021-03-03 NOTE — Telephone Encounter (Signed)
Left message for patient to call back  

## 2021-03-04 DIAGNOSIS — I69391 Dysphagia following cerebral infarction: Secondary | ICD-10-CM | POA: Diagnosis not present

## 2021-03-04 DIAGNOSIS — E785 Hyperlipidemia, unspecified: Secondary | ICD-10-CM | POA: Diagnosis not present

## 2021-03-04 DIAGNOSIS — N183 Chronic kidney disease, stage 3 unspecified: Secondary | ICD-10-CM | POA: Diagnosis not present

## 2021-03-04 DIAGNOSIS — I130 Hypertensive heart and chronic kidney disease with heart failure and stage 1 through stage 4 chronic kidney disease, or unspecified chronic kidney disease: Secondary | ICD-10-CM | POA: Diagnosis not present

## 2021-03-04 DIAGNOSIS — I69351 Hemiplegia and hemiparesis following cerebral infarction affecting right dominant side: Secondary | ICD-10-CM | POA: Diagnosis not present

## 2021-03-04 DIAGNOSIS — R1312 Dysphagia, oropharyngeal phase: Secondary | ICD-10-CM | POA: Diagnosis not present

## 2021-03-04 DIAGNOSIS — E039 Hypothyroidism, unspecified: Secondary | ICD-10-CM | POA: Diagnosis not present

## 2021-03-04 DIAGNOSIS — I5032 Chronic diastolic (congestive) heart failure: Secondary | ICD-10-CM | POA: Diagnosis not present

## 2021-03-08 NOTE — Telephone Encounter (Signed)
Left message for patient to call back  

## 2021-03-09 DIAGNOSIS — R1312 Dysphagia, oropharyngeal phase: Secondary | ICD-10-CM | POA: Diagnosis not present

## 2021-03-09 DIAGNOSIS — I69391 Dysphagia following cerebral infarction: Secondary | ICD-10-CM | POA: Diagnosis not present

## 2021-03-09 DIAGNOSIS — E039 Hypothyroidism, unspecified: Secondary | ICD-10-CM | POA: Diagnosis not present

## 2021-03-09 DIAGNOSIS — I5032 Chronic diastolic (congestive) heart failure: Secondary | ICD-10-CM | POA: Diagnosis not present

## 2021-03-09 DIAGNOSIS — I69351 Hemiplegia and hemiparesis following cerebral infarction affecting right dominant side: Secondary | ICD-10-CM | POA: Diagnosis not present

## 2021-03-09 DIAGNOSIS — E785 Hyperlipidemia, unspecified: Secondary | ICD-10-CM | POA: Diagnosis not present

## 2021-03-09 DIAGNOSIS — I130 Hypertensive heart and chronic kidney disease with heart failure and stage 1 through stage 4 chronic kidney disease, or unspecified chronic kidney disease: Secondary | ICD-10-CM | POA: Diagnosis not present

## 2021-03-09 DIAGNOSIS — N183 Chronic kidney disease, stage 3 unspecified: Secondary | ICD-10-CM | POA: Diagnosis not present

## 2021-03-09 NOTE — Telephone Encounter (Signed)
Unable to reach the patient. mychart message sent as well.

## 2021-03-10 DIAGNOSIS — I69391 Dysphagia following cerebral infarction: Secondary | ICD-10-CM | POA: Diagnosis not present

## 2021-03-10 DIAGNOSIS — I5032 Chronic diastolic (congestive) heart failure: Secondary | ICD-10-CM | POA: Diagnosis not present

## 2021-03-10 DIAGNOSIS — E785 Hyperlipidemia, unspecified: Secondary | ICD-10-CM | POA: Diagnosis not present

## 2021-03-10 DIAGNOSIS — R1312 Dysphagia, oropharyngeal phase: Secondary | ICD-10-CM | POA: Diagnosis not present

## 2021-03-10 DIAGNOSIS — N183 Chronic kidney disease, stage 3 unspecified: Secondary | ICD-10-CM | POA: Diagnosis not present

## 2021-03-10 DIAGNOSIS — E039 Hypothyroidism, unspecified: Secondary | ICD-10-CM | POA: Diagnosis not present

## 2021-03-10 DIAGNOSIS — I69351 Hemiplegia and hemiparesis following cerebral infarction affecting right dominant side: Secondary | ICD-10-CM | POA: Diagnosis not present

## 2021-03-10 DIAGNOSIS — I130 Hypertensive heart and chronic kidney disease with heart failure and stage 1 through stage 4 chronic kidney disease, or unspecified chronic kidney disease: Secondary | ICD-10-CM | POA: Diagnosis not present

## 2021-03-12 NOTE — Telephone Encounter (Signed)
Unable to reach the patient. Labs will be done at his next appointment. Message will be closed.

## 2021-03-16 ENCOUNTER — Other Ambulatory Visit: Payer: Self-pay | Admitting: Family Medicine

## 2021-03-16 DIAGNOSIS — I69351 Hemiplegia and hemiparesis following cerebral infarction affecting right dominant side: Secondary | ICD-10-CM | POA: Diagnosis not present

## 2021-03-16 DIAGNOSIS — R1312 Dysphagia, oropharyngeal phase: Secondary | ICD-10-CM | POA: Diagnosis not present

## 2021-03-16 DIAGNOSIS — I130 Hypertensive heart and chronic kidney disease with heart failure and stage 1 through stage 4 chronic kidney disease, or unspecified chronic kidney disease: Secondary | ICD-10-CM | POA: Diagnosis not present

## 2021-03-16 DIAGNOSIS — I69391 Dysphagia following cerebral infarction: Secondary | ICD-10-CM | POA: Diagnosis not present

## 2021-03-16 DIAGNOSIS — I5032 Chronic diastolic (congestive) heart failure: Secondary | ICD-10-CM | POA: Diagnosis not present

## 2021-03-16 DIAGNOSIS — N183 Chronic kidney disease, stage 3 unspecified: Secondary | ICD-10-CM | POA: Diagnosis not present

## 2021-03-16 DIAGNOSIS — E785 Hyperlipidemia, unspecified: Secondary | ICD-10-CM | POA: Diagnosis not present

## 2021-03-16 DIAGNOSIS — E039 Hypothyroidism, unspecified: Secondary | ICD-10-CM | POA: Diagnosis not present

## 2021-03-17 ENCOUNTER — Telehealth: Payer: Self-pay | Admitting: Family Medicine

## 2021-03-17 MED ORDER — NITROGLYCERIN 0.4 MG SL SUBL: 0.4000 mg | SUBLINGUAL_TABLET | SUBLINGUAL | 1 refills | Status: DC | PRN

## 2021-03-17 NOTE — Telephone Encounter (Signed)
Rx has been sent in. Nothing further needed. 

## 2021-03-17 NOTE — Telephone Encounter (Signed)
  nitroGLYCERIN (NITROSTAT) 0.4 MG SL tablet  [id number] - [place], [place] - [address] AT [address] Phone:  [phone]  Fax:  [phone]

## 2021-03-18 DIAGNOSIS — I69351 Hemiplegia and hemiparesis following cerebral infarction affecting right dominant side: Secondary | ICD-10-CM | POA: Diagnosis not present

## 2021-03-18 DIAGNOSIS — I69391 Dysphagia following cerebral infarction: Secondary | ICD-10-CM | POA: Diagnosis not present

## 2021-03-18 DIAGNOSIS — E039 Hypothyroidism, unspecified: Secondary | ICD-10-CM | POA: Diagnosis not present

## 2021-03-18 DIAGNOSIS — I5032 Chronic diastolic (congestive) heart failure: Secondary | ICD-10-CM | POA: Diagnosis not present

## 2021-03-18 DIAGNOSIS — N183 Chronic kidney disease, stage 3 unspecified: Secondary | ICD-10-CM | POA: Diagnosis not present

## 2021-03-18 DIAGNOSIS — I130 Hypertensive heart and chronic kidney disease with heart failure and stage 1 through stage 4 chronic kidney disease, or unspecified chronic kidney disease: Secondary | ICD-10-CM | POA: Diagnosis not present

## 2021-03-18 DIAGNOSIS — E785 Hyperlipidemia, unspecified: Secondary | ICD-10-CM | POA: Diagnosis not present

## 2021-03-18 DIAGNOSIS — R1312 Dysphagia, oropharyngeal phase: Secondary | ICD-10-CM | POA: Diagnosis not present

## 2021-03-22 DIAGNOSIS — I5032 Chronic diastolic (congestive) heart failure: Secondary | ICD-10-CM | POA: Diagnosis not present

## 2021-03-22 DIAGNOSIS — E039 Hypothyroidism, unspecified: Secondary | ICD-10-CM | POA: Diagnosis not present

## 2021-03-22 DIAGNOSIS — R1312 Dysphagia, oropharyngeal phase: Secondary | ICD-10-CM | POA: Diagnosis not present

## 2021-03-22 DIAGNOSIS — I69391 Dysphagia following cerebral infarction: Secondary | ICD-10-CM | POA: Diagnosis not present

## 2021-03-22 DIAGNOSIS — E785 Hyperlipidemia, unspecified: Secondary | ICD-10-CM | POA: Diagnosis not present

## 2021-03-22 DIAGNOSIS — I130 Hypertensive heart and chronic kidney disease with heart failure and stage 1 through stage 4 chronic kidney disease, or unspecified chronic kidney disease: Secondary | ICD-10-CM | POA: Diagnosis not present

## 2021-03-22 DIAGNOSIS — N183 Chronic kidney disease, stage 3 unspecified: Secondary | ICD-10-CM | POA: Diagnosis not present

## 2021-03-22 DIAGNOSIS — I69351 Hemiplegia and hemiparesis following cerebral infarction affecting right dominant side: Secondary | ICD-10-CM | POA: Diagnosis not present

## 2021-03-22 MED ORDER — NITROGLYCERIN 0.4 MG SL SUBL: 0.4000 mg | SUBLINGUAL_TABLET | SUBLINGUAL | 1 refills | Status: AC | PRN

## 2021-03-22 NOTE — Telephone Encounter (Signed)
Rx has been sent in. Nothing further needed. 

## 2021-03-22 NOTE — Telephone Encounter (Signed)
Patient wife misis calling back and stated that she received medication and seems to have misplaced it and wanted to see if provider can resend medication. Pt uses   [id number] [address], [place], [place] [address]  Phone:  [phone] Fax:  [phone]  CB is [phone]

## 2021-03-22 NOTE — Addendum Note (Signed)
Addended by: Rebecca Eaton on: 03/22/2021 11:52 AM   Modules accepted: Orders

## 2021-03-26 ENCOUNTER — Telehealth: Payer: Self-pay | Admitting: Family Medicine

## 2021-03-26 DIAGNOSIS — I69391 Dysphagia following cerebral infarction: Secondary | ICD-10-CM | POA: Diagnosis not present

## 2021-03-26 DIAGNOSIS — I130 Hypertensive heart and chronic kidney disease with heart failure and stage 1 through stage 4 chronic kidney disease, or unspecified chronic kidney disease: Secondary | ICD-10-CM | POA: Diagnosis not present

## 2021-03-26 DIAGNOSIS — R1312 Dysphagia, oropharyngeal phase: Secondary | ICD-10-CM | POA: Diagnosis not present

## 2021-03-26 DIAGNOSIS — E039 Hypothyroidism, unspecified: Secondary | ICD-10-CM | POA: Diagnosis not present

## 2021-03-26 DIAGNOSIS — N183 Chronic kidney disease, stage 3 unspecified: Secondary | ICD-10-CM | POA: Diagnosis not present

## 2021-03-26 DIAGNOSIS — I69351 Hemiplegia and hemiparesis following cerebral infarction affecting right dominant side: Secondary | ICD-10-CM | POA: Diagnosis not present

## 2021-03-26 DIAGNOSIS — I5032 Chronic diastolic (congestive) heart failure: Secondary | ICD-10-CM | POA: Diagnosis not present

## 2021-03-26 DIAGNOSIS — E785 Hyperlipidemia, unspecified: Secondary | ICD-10-CM | POA: Diagnosis not present

## 2021-03-26 NOTE — Telephone Encounter (Signed)
Robert Rowe is calling in for nursing evaluation to access L heel looks likes a pressure sore.  May leave a msg on her secured voice mail.

## 2021-03-26 NOTE — Telephone Encounter (Signed)
OK 

## 2021-03-26 NOTE — Telephone Encounter (Signed)
Spoke with Robert Rowe. Order has been given nothing further is needed.

## 2021-03-30 DIAGNOSIS — I69391 Dysphagia following cerebral infarction: Secondary | ICD-10-CM | POA: Diagnosis not present

## 2021-03-30 DIAGNOSIS — I130 Hypertensive heart and chronic kidney disease with heart failure and stage 1 through stage 4 chronic kidney disease, or unspecified chronic kidney disease: Secondary | ICD-10-CM | POA: Diagnosis not present

## 2021-03-30 DIAGNOSIS — E039 Hypothyroidism, unspecified: Secondary | ICD-10-CM | POA: Diagnosis not present

## 2021-03-30 DIAGNOSIS — I69351 Hemiplegia and hemiparesis following cerebral infarction affecting right dominant side: Secondary | ICD-10-CM | POA: Diagnosis not present

## 2021-03-30 DIAGNOSIS — N183 Chronic kidney disease, stage 3 unspecified: Secondary | ICD-10-CM | POA: Diagnosis not present

## 2021-03-30 DIAGNOSIS — R1312 Dysphagia, oropharyngeal phase: Secondary | ICD-10-CM | POA: Diagnosis not present

## 2021-03-30 DIAGNOSIS — E785 Hyperlipidemia, unspecified: Secondary | ICD-10-CM | POA: Diagnosis not present

## 2021-03-30 DIAGNOSIS — I5032 Chronic diastolic (congestive) heart failure: Secondary | ICD-10-CM | POA: Diagnosis not present

## 2021-03-31 DIAGNOSIS — D229 Melanocytic nevi, unspecified: Secondary | ICD-10-CM | POA: Diagnosis not present

## 2021-03-31 DIAGNOSIS — L905 Scar conditions and fibrosis of skin: Secondary | ICD-10-CM | POA: Diagnosis not present

## 2021-03-31 DIAGNOSIS — Z85820 Personal history of malignant melanoma of skin: Secondary | ICD-10-CM | POA: Diagnosis not present

## 2021-03-31 DIAGNOSIS — Z85828 Personal history of other malignant neoplasm of skin: Secondary | ICD-10-CM | POA: Diagnosis not present

## 2021-03-31 DIAGNOSIS — L218 Other seborrheic dermatitis: Secondary | ICD-10-CM | POA: Diagnosis not present

## 2021-03-31 DIAGNOSIS — L814 Other melanin hyperpigmentation: Secondary | ICD-10-CM | POA: Diagnosis not present

## 2021-03-31 DIAGNOSIS — L821 Other seborrheic keratosis: Secondary | ICD-10-CM | POA: Diagnosis not present

## 2021-03-31 DIAGNOSIS — L570 Actinic keratosis: Secondary | ICD-10-CM | POA: Diagnosis not present

## 2021-03-31 DIAGNOSIS — D1801 Hemangioma of skin and subcutaneous tissue: Secondary | ICD-10-CM | POA: Diagnosis not present

## 2021-04-01 DIAGNOSIS — I130 Hypertensive heart and chronic kidney disease with heart failure and stage 1 through stage 4 chronic kidney disease, or unspecified chronic kidney disease: Secondary | ICD-10-CM | POA: Diagnosis not present

## 2021-04-01 DIAGNOSIS — E785 Hyperlipidemia, unspecified: Secondary | ICD-10-CM | POA: Diagnosis not present

## 2021-04-01 DIAGNOSIS — E039 Hypothyroidism, unspecified: Secondary | ICD-10-CM | POA: Diagnosis not present

## 2021-04-01 DIAGNOSIS — I69391 Dysphagia following cerebral infarction: Secondary | ICD-10-CM | POA: Diagnosis not present

## 2021-04-01 DIAGNOSIS — N183 Chronic kidney disease, stage 3 unspecified: Secondary | ICD-10-CM | POA: Diagnosis not present

## 2021-04-01 DIAGNOSIS — I69351 Hemiplegia and hemiparesis following cerebral infarction affecting right dominant side: Secondary | ICD-10-CM | POA: Diagnosis not present

## 2021-04-01 DIAGNOSIS — R1312 Dysphagia, oropharyngeal phase: Secondary | ICD-10-CM | POA: Diagnosis not present

## 2021-04-01 DIAGNOSIS — I5032 Chronic diastolic (congestive) heart failure: Secondary | ICD-10-CM | POA: Diagnosis not present

## 2021-04-05 DIAGNOSIS — E785 Hyperlipidemia, unspecified: Secondary | ICD-10-CM | POA: Diagnosis not present

## 2021-04-05 DIAGNOSIS — I5032 Chronic diastolic (congestive) heart failure: Secondary | ICD-10-CM | POA: Diagnosis not present

## 2021-04-05 DIAGNOSIS — E039 Hypothyroidism, unspecified: Secondary | ICD-10-CM | POA: Diagnosis not present

## 2021-04-05 DIAGNOSIS — I69351 Hemiplegia and hemiparesis following cerebral infarction affecting right dominant side: Secondary | ICD-10-CM | POA: Diagnosis not present

## 2021-04-05 DIAGNOSIS — I130 Hypertensive heart and chronic kidney disease with heart failure and stage 1 through stage 4 chronic kidney disease, or unspecified chronic kidney disease: Secondary | ICD-10-CM | POA: Diagnosis not present

## 2021-04-05 DIAGNOSIS — N183 Chronic kidney disease, stage 3 unspecified: Secondary | ICD-10-CM | POA: Diagnosis not present

## 2021-04-05 DIAGNOSIS — I69391 Dysphagia following cerebral infarction: Secondary | ICD-10-CM | POA: Diagnosis not present

## 2021-04-05 DIAGNOSIS — R1312 Dysphagia, oropharyngeal phase: Secondary | ICD-10-CM | POA: Diagnosis not present

## 2021-04-07 DIAGNOSIS — E039 Hypothyroidism, unspecified: Secondary | ICD-10-CM | POA: Diagnosis not present

## 2021-04-07 DIAGNOSIS — I69391 Dysphagia following cerebral infarction: Secondary | ICD-10-CM | POA: Diagnosis not present

## 2021-04-07 DIAGNOSIS — I130 Hypertensive heart and chronic kidney disease with heart failure and stage 1 through stage 4 chronic kidney disease, or unspecified chronic kidney disease: Secondary | ICD-10-CM | POA: Diagnosis not present

## 2021-04-07 DIAGNOSIS — E785 Hyperlipidemia, unspecified: Secondary | ICD-10-CM | POA: Diagnosis not present

## 2021-04-07 DIAGNOSIS — I5032 Chronic diastolic (congestive) heart failure: Secondary | ICD-10-CM | POA: Diagnosis not present

## 2021-04-07 DIAGNOSIS — N183 Chronic kidney disease, stage 3 unspecified: Secondary | ICD-10-CM | POA: Diagnosis not present

## 2021-04-07 DIAGNOSIS — R1312 Dysphagia, oropharyngeal phase: Secondary | ICD-10-CM | POA: Diagnosis not present

## 2021-04-07 DIAGNOSIS — I69351 Hemiplegia and hemiparesis following cerebral infarction affecting right dominant side: Secondary | ICD-10-CM | POA: Diagnosis not present

## 2021-04-08 ENCOUNTER — Telehealth: Payer: Self-pay | Admitting: Family Medicine

## 2021-04-08 NOTE — Telephone Encounter (Signed)
Per [facility] nurse  Lauren  [phone]  went out to the home to change patient today per wife informs that the patient had some blood in his urine.  Pt has no burning. He does not have a nursing order to check his urine. [facility] nurse would like to know what are Dr. Erick Blinks recommendations.

## 2021-04-08 NOTE — Telephone Encounter (Signed)
See previous message

## 2021-04-08 NOTE — Telephone Encounter (Signed)
Lauren w/[facility] is call to check the status of the previous msg and is aware that pts provider is not in the office today due to it being his day off and they will see the msg on tomorrow 04/09/2021.  Lauren verbalized understanding and stated that she will relate that to the pts spouse b/c she was wanting a call back to let her know what Dr. Elease Hashimoto said.

## 2021-04-08 NOTE — Telephone Encounter (Signed)
See if we can get urinalysis with reflex to culture if indicated

## 2021-04-09 NOTE — Telephone Encounter (Signed)
Left message for Robert Rowe to call back, to receive  message from Dr. Elease Hashimoto

## 2021-04-09 NOTE — Telephone Encounter (Signed)
Lauren returned the call to the office

## 2021-04-12 DIAGNOSIS — R1312 Dysphagia, oropharyngeal phase: Secondary | ICD-10-CM | POA: Diagnosis not present

## 2021-04-12 DIAGNOSIS — E785 Hyperlipidemia, unspecified: Secondary | ICD-10-CM | POA: Diagnosis not present

## 2021-04-12 DIAGNOSIS — E039 Hypothyroidism, unspecified: Secondary | ICD-10-CM | POA: Diagnosis not present

## 2021-04-12 DIAGNOSIS — I130 Hypertensive heart and chronic kidney disease with heart failure and stage 1 through stage 4 chronic kidney disease, or unspecified chronic kidney disease: Secondary | ICD-10-CM | POA: Diagnosis not present

## 2021-04-12 DIAGNOSIS — I5032 Chronic diastolic (congestive) heart failure: Secondary | ICD-10-CM | POA: Diagnosis not present

## 2021-04-12 DIAGNOSIS — I69351 Hemiplegia and hemiparesis following cerebral infarction affecting right dominant side: Secondary | ICD-10-CM | POA: Diagnosis not present

## 2021-04-12 DIAGNOSIS — I69391 Dysphagia following cerebral infarction: Secondary | ICD-10-CM | POA: Diagnosis not present

## 2021-04-12 DIAGNOSIS — N183 Chronic kidney disease, stage 3 unspecified: Secondary | ICD-10-CM | POA: Diagnosis not present

## 2021-04-12 NOTE — Telephone Encounter (Signed)
Spoke with [facility], Ander Purpura was in a meeting and will call back.

## 2021-04-12 NOTE — Telephone Encounter (Signed)
Lauren w/[facility] returned the call to office

## 2021-04-12 NOTE — Telephone Encounter (Signed)
Spoke with Ander Purpura, she is aware of Dr. Anastasio Auerbach message. Verbal orders given for a UA. Nothing further needed.

## 2021-04-13 ENCOUNTER — Inpatient Hospital Stay (HOSPITAL_COMMUNITY)
Admission: EM | Admit: 2021-04-13 | Discharge: 2021-04-15 | DRG: 291 | Disposition: A | Discharge status: Home Health | Payer: Medicare Other | Attending: Internal Medicine | Admitting: Internal Medicine

## 2021-04-13 ENCOUNTER — Other Ambulatory Visit: Payer: Self-pay

## 2021-04-13 ENCOUNTER — Observation Stay (HOSPITAL_COMMUNITY): Payer: Medicare Other

## 2021-04-13 ENCOUNTER — Encounter (HOSPITAL_COMMUNITY): Payer: Self-pay | Admitting: Internal Medicine

## 2021-04-13 ENCOUNTER — Emergency Department (HOSPITAL_COMMUNITY): Payer: Medicare Other

## 2021-04-13 ENCOUNTER — Telehealth: Payer: Self-pay

## 2021-04-13 DIAGNOSIS — R609 Edema, unspecified: Secondary | ICD-10-CM | POA: Diagnosis not present

## 2021-04-13 DIAGNOSIS — R5381 Other malaise: Secondary | ICD-10-CM | POA: Diagnosis not present

## 2021-04-13 DIAGNOSIS — I5033 Acute on chronic diastolic (congestive) heart failure: Secondary | ICD-10-CM | POA: Diagnosis not present

## 2021-04-13 DIAGNOSIS — I69391 Dysphagia following cerebral infarction: Secondary | ICD-10-CM | POA: Diagnosis not present

## 2021-04-13 DIAGNOSIS — J9801 Acute bronchospasm: Secondary | ICD-10-CM | POA: Diagnosis not present

## 2021-04-13 DIAGNOSIS — E785 Hyperlipidemia, unspecified: Secondary | ICD-10-CM | POA: Diagnosis present

## 2021-04-13 DIAGNOSIS — Z87891 Personal history of nicotine dependence: Secondary | ICD-10-CM

## 2021-04-13 DIAGNOSIS — N400 Enlarged prostate without lower urinary tract symptoms: Secondary | ICD-10-CM | POA: Diagnosis present

## 2021-04-13 DIAGNOSIS — R0689 Other abnormalities of breathing: Secondary | ICD-10-CM | POA: Diagnosis not present

## 2021-04-13 DIAGNOSIS — D72828 Other elevated white blood cell count: Secondary | ICD-10-CM | POA: Diagnosis not present

## 2021-04-13 DIAGNOSIS — R000 Tachycardia, unspecified: Secondary | ICD-10-CM | POA: Diagnosis not present

## 2021-04-13 DIAGNOSIS — Z66 Do not resuscitate: Secondary | ICD-10-CM | POA: Diagnosis present

## 2021-04-13 DIAGNOSIS — E039 Hypothyroidism, unspecified: Secondary | ICD-10-CM | POA: Diagnosis not present

## 2021-04-13 DIAGNOSIS — M069 Rheumatoid arthritis, unspecified: Secondary | ICD-10-CM | POA: Diagnosis present

## 2021-04-13 DIAGNOSIS — I509 Heart failure, unspecified: Secondary | ICD-10-CM

## 2021-04-13 DIAGNOSIS — Z79899 Other long term (current) drug therapy: Secondary | ICD-10-CM | POA: Diagnosis not present

## 2021-04-13 DIAGNOSIS — I700 Atherosclerosis of aorta: Secondary | ICD-10-CM | POA: Diagnosis not present

## 2021-04-13 DIAGNOSIS — K219 Gastro-esophageal reflux disease without esophagitis: Secondary | ICD-10-CM | POA: Diagnosis not present

## 2021-04-13 DIAGNOSIS — I69351 Hemiplegia and hemiparesis following cerebral infarction affecting right dominant side: Secondary | ICD-10-CM | POA: Diagnosis not present

## 2021-04-13 DIAGNOSIS — J9811 Atelectasis: Secondary | ICD-10-CM | POA: Diagnosis not present

## 2021-04-13 DIAGNOSIS — Z7401 Bed confinement status: Secondary | ICD-10-CM

## 2021-04-13 DIAGNOSIS — I2510 Atherosclerotic heart disease of native coronary artery without angina pectoris: Secondary | ICD-10-CM | POA: Diagnosis not present

## 2021-04-13 DIAGNOSIS — I69398 Other sequelae of cerebral infarction: Secondary | ICD-10-CM

## 2021-04-13 DIAGNOSIS — R6889 Other general symptoms and signs: Secondary | ICD-10-CM | POA: Diagnosis not present

## 2021-04-13 DIAGNOSIS — G3184 Mild cognitive impairment, so stated: Secondary | ICD-10-CM | POA: Diagnosis present

## 2021-04-13 DIAGNOSIS — I5032 Chronic diastolic (congestive) heart failure: Secondary | ICD-10-CM | POA: Diagnosis not present

## 2021-04-13 DIAGNOSIS — Z79890 Hormone replacement therapy (postmenopausal): Secondary | ICD-10-CM | POA: Diagnosis not present

## 2021-04-13 DIAGNOSIS — Z20822 Contact with and (suspected) exposure to covid-19: Secondary | ICD-10-CM | POA: Diagnosis not present

## 2021-04-13 DIAGNOSIS — N183 Chronic kidney disease, stage 3 unspecified: Secondary | ICD-10-CM | POA: Diagnosis not present

## 2021-04-13 DIAGNOSIS — Z743 Need for continuous supervision: Secondary | ICD-10-CM | POA: Diagnosis not present

## 2021-04-13 DIAGNOSIS — R0602 Shortness of breath: Secondary | ICD-10-CM | POA: Diagnosis not present

## 2021-04-13 DIAGNOSIS — Z7902 Long term (current) use of antithrombotics/antiplatelets: Secondary | ICD-10-CM | POA: Diagnosis not present

## 2021-04-13 DIAGNOSIS — N184 Chronic kidney disease, stage 4 (severe): Secondary | ICD-10-CM | POA: Diagnosis not present

## 2021-04-13 DIAGNOSIS — R1312 Dysphagia, oropharyngeal phase: Secondary | ICD-10-CM | POA: Diagnosis not present

## 2021-04-13 DIAGNOSIS — I110 Hypertensive heart disease with heart failure: Secondary | ICD-10-CM | POA: Diagnosis not present

## 2021-04-13 DIAGNOSIS — R062 Wheezing: Secondary | ICD-10-CM | POA: Diagnosis not present

## 2021-04-13 DIAGNOSIS — I130 Hypertensive heart and chronic kidney disease with heart failure and stage 1 through stage 4 chronic kidney disease, or unspecified chronic kidney disease: Principal | ICD-10-CM | POA: Diagnosis present

## 2021-04-13 DIAGNOSIS — K449 Diaphragmatic hernia without obstruction or gangrene: Secondary | ICD-10-CM | POA: Diagnosis not present

## 2021-04-13 DIAGNOSIS — M2550 Pain in unspecified joint: Secondary | ICD-10-CM | POA: Diagnosis not present

## 2021-04-13 DIAGNOSIS — R600 Localized edema: Secondary | ICD-10-CM | POA: Diagnosis present

## 2021-04-13 DIAGNOSIS — Z7982 Long term (current) use of aspirin: Secondary | ICD-10-CM

## 2021-04-13 DIAGNOSIS — I5031 Acute diastolic (congestive) heart failure: Secondary | ICD-10-CM | POA: Diagnosis present

## 2021-04-13 LAB — CBC WITH DIFFERENTIAL/PLATELET
Abs Immature Granulocytes: 0.05 10*3/uL (ref 0.00–0.07)
Basophils Absolute: 0.1 10*3/uL (ref 0.0–0.1)
Basophils Relative: 1 %
Eosinophils Absolute: 0.1 10*3/uL (ref 0.0–0.5)
Eosinophils Relative: 1 %
HCT: 45.2 % (ref 39.0–52.0)
Hemoglobin: 14.1 g/dL (ref 13.0–17.0)
Immature Granulocytes: 0 %
Lymphocytes Relative: 14 %
Lymphs Abs: 1.6 10*3/uL (ref 0.7–4.0)
MCH: 31.8 pg (ref 26.0–34.0)
MCHC: 31.2 g/dL (ref 30.0–36.0)
MCV: 101.8 fL — ABNORMAL HIGH (ref 80.0–100.0)
Monocytes Absolute: 1.3 10*3/uL — ABNORMAL HIGH (ref 0.1–1.0)
Monocytes Relative: 12 %
Neutro Abs: 8.1 10*3/uL — ABNORMAL HIGH (ref 1.7–7.7)
Neutrophils Relative %: 72 %
Platelets: UNDETERMINED 10*3/uL (ref 150–400)
RBC: 4.44 MIL/uL (ref 4.22–5.81)
RDW: 13.3 % (ref 11.5–15.5)
WBC: 11.3 10*3/uL — ABNORMAL HIGH (ref 4.0–10.5)
nRBC: 0 % (ref 0.0–0.2)

## 2021-04-13 LAB — BASIC METABOLIC PANEL
Anion gap: 10 (ref 5–15)
BUN: 23 mg/dL (ref 8–23)
CO2: 22 mmol/L (ref 22–32)
Calcium: 8.7 mg/dL — ABNORMAL LOW (ref 8.9–10.3)
Chloride: 104 mmol/L (ref 98–111)
Creatinine, Ser: 1.58 mg/dL — ABNORMAL HIGH (ref 0.61–1.24)
GFR, Estimated: 39 mL/min — ABNORMAL LOW (ref >60)
Glucose, Bld: 116 mg/dL — ABNORMAL HIGH (ref 70–99)
Potassium: 4.6 mmol/L (ref 3.5–5.1)
Sodium: 136 mmol/L (ref 135–145)

## 2021-04-13 LAB — D-DIMER, QUANTITATIVE: D-Dimer, Quant: 2.75 ug/mL-FEU — ABNORMAL HIGH (ref 0.00–0.50)

## 2021-04-13 LAB — TROPONIN I (HIGH SENSITIVITY)
Troponin I (High Sensitivity): 15 ng/L (ref <18)
Troponin I (High Sensitivity): 17 ng/L (ref <18)

## 2021-04-13 LAB — RESP PANEL BY RT-PCR (FLU A&B, COVID) ARPGX2
Influenza A by PCR: NEGATIVE
Influenza B by PCR: NEGATIVE
SARS Coronavirus 2 by RT PCR: NEGATIVE

## 2021-04-13 LAB — BRAIN NATRIURETIC PEPTIDE: B Natriuretic Peptide: 300.9 pg/mL — ABNORMAL HIGH (ref 0.0–100.0)

## 2021-04-13 MED ORDER — MAGNESIUM HYDROXIDE 400 MG/5ML PO SUSP
5.0000 mL | Freq: Every evening | ORAL | Status: DC | PRN
  Filled 2021-04-13: qty 30

## 2021-04-13 MED ORDER — SODIUM CHLORIDE 0.9 % IV SOLN: 250.0000 mL | INTRAVENOUS | Status: DC | PRN

## 2021-04-13 MED ORDER — ASPIRIN EC 81 MG PO TBEC
81.0000 mg | DELAYED_RELEASE_TABLET | Freq: Every day | ORAL | Status: DC
  Administered 2021-04-14 – 2021-04-15 (×2): 81 mg via ORAL
  Filled 2021-04-13 (×2): qty 1

## 2021-04-13 MED ORDER — ALBUTEROL SULFATE (2.5 MG/3ML) 0.083% IN NEBU: 3.0000 mL | INHALATION_SOLUTION | Freq: Four times a day (QID) | RESPIRATORY_TRACT | Status: DC | PRN

## 2021-04-13 MED ORDER — LEVOTHYROXINE SODIUM 50 MCG PO TABS
50.0000 ug | ORAL_TABLET | Freq: Every day | ORAL | Status: DC
  Administered 2021-04-14 – 2021-04-15 (×2): 50 ug via ORAL
  Filled 2021-04-13 (×2): qty 1

## 2021-04-13 MED ORDER — LATANOPROST 0.005 % OP SOLN
1.0000 [drp] | Freq: Every day | OPHTHALMIC | Status: DC
  Administered 2021-04-14: 1 [drp] via OPHTHALMIC
  Filled 2021-04-13: qty 2.5

## 2021-04-13 MED ORDER — ATORVASTATIN CALCIUM 40 MG PO TABS
40.0000 mg | ORAL_TABLET | Freq: Every day | ORAL | Status: DC
  Administered 2021-04-14 – 2021-04-15 (×2): 40 mg via ORAL
  Filled 2021-04-13 (×2): qty 1

## 2021-04-13 MED ORDER — SENNOSIDES-DOCUSATE SODIUM 8.6-50 MG PO TABS: 1.0000 | ORAL_TABLET | Freq: Every evening | ORAL | Status: DC | PRN

## 2021-04-13 MED ORDER — MECLIZINE HCL 25 MG PO TABS: 12.5000 mg | ORAL_TABLET | Freq: Three times a day (TID) | ORAL | Status: DC | PRN

## 2021-04-13 MED ORDER — TRIAMCINOLONE ACETONIDE 0.1 % EX CREA
TOPICAL_CREAM | Freq: Two times a day (BID) | CUTANEOUS | Status: DC
  Administered 2021-04-14: 1 via TOPICAL
  Filled 2021-04-13: qty 15

## 2021-04-13 MED ORDER — GUAIFENESIN ER 600 MG PO TB12
1200.0000 mg | ORAL_TABLET | Freq: Two times a day (BID) | ORAL | Status: DC
  Administered 2021-04-13 – 2021-04-15 (×4): 1200 mg via ORAL
  Filled 2021-04-13 (×4): qty 2

## 2021-04-13 MED ORDER — ALBUTEROL SULFATE (2.5 MG/3ML) 0.083% IN NEBU: 3.0000 mL | INHALATION_SOLUTION | Freq: Once | RESPIRATORY_TRACT | Status: DC

## 2021-04-13 MED ORDER — ACETAMINOPHEN 325 MG PO TABS: 650.0000 mg | ORAL_TABLET | ORAL | Status: DC | PRN

## 2021-04-13 MED ORDER — IPRATROPIUM-ALBUTEROL 0.5-2.5 (3) MG/3ML IN SOLN
3.0000 mL | Freq: Four times a day (QID) | RESPIRATORY_TRACT | Status: DC
  Administered 2021-04-13: 3 mL via RESPIRATORY_TRACT
  Filled 2021-04-13: qty 3

## 2021-04-13 MED ORDER — HEPARIN SODIUM (PORCINE) 5000 UNIT/ML IJ SOLN
5000.0000 [IU] | Freq: Two times a day (BID) | INTRAMUSCULAR | Status: DC
  Administered 2021-04-13 – 2021-04-15 (×4): 5000 [IU] via SUBCUTANEOUS
  Filled 2021-04-13 (×4): qty 1

## 2021-04-13 MED ORDER — SODIUM CHLORIDE 0.9% FLUSH: 3.0000 mL | INTRAVENOUS | Status: DC | PRN

## 2021-04-13 MED ORDER — CLOPIDOGREL BISULFATE 75 MG PO TABS
75.0000 mg | ORAL_TABLET | Freq: Every day | ORAL | Status: DC
  Administered 2021-04-14 – 2021-04-15 (×2): 75 mg via ORAL
  Filled 2021-04-13 (×2): qty 1

## 2021-04-13 MED ORDER — FUROSEMIDE 10 MG/ML IJ SOLN
20.0000 mg | Freq: Every day | INTRAMUSCULAR | Status: DC
  Administered 2021-04-13 – 2021-04-14 (×2): 20 mg via INTRAVENOUS
  Filled 2021-04-13 (×2): qty 2

## 2021-04-13 MED ORDER — METOPROLOL SUCCINATE ER 25 MG PO TB24: 25.0000 mg | ORAL_TABLET | Freq: Every day | ORAL | Status: DC

## 2021-04-13 MED ORDER — ONDANSETRON HCL 4 MG/2ML IJ SOLN: 4.0000 mg | Freq: Four times a day (QID) | INTRAMUSCULAR | Status: DC | PRN

## 2021-04-13 MED ORDER — METOPROLOL TARTRATE 25 MG PO TABS
25.0000 mg | ORAL_TABLET | Freq: Two times a day (BID) | ORAL | Status: DC
  Administered 2021-04-13 – 2021-04-15 (×4): 25 mg via ORAL
  Filled 2021-04-13 (×4): qty 1

## 2021-04-13 MED ORDER — SODIUM CHLORIDE 0.9% FLUSH
3.0000 mL | Freq: Two times a day (BID) | INTRAVENOUS | Status: DC
  Administered 2021-04-13 – 2021-04-15 (×4): 3 mL via INTRAVENOUS

## 2021-04-13 MED ORDER — IPRATROPIUM-ALBUTEROL 0.5-2.5 (3) MG/3ML IN SOLN
3.0000 mL | Freq: Once | RESPIRATORY_TRACT | Status: AC
  Administered 2021-04-13: 3 mL via RESPIRATORY_TRACT
  Filled 2021-04-13: qty 3

## 2021-04-13 MED ORDER — ADULT MULTIVITAMIN W/MINERALS CH
1.0000 | ORAL_TABLET | Freq: Every day | ORAL | Status: DC
  Administered 2021-04-14 – 2021-04-15 (×2): 1 via ORAL
  Filled 2021-04-13 (×2): qty 1

## 2021-04-13 MED ORDER — PANTOPRAZOLE SODIUM 40 MG PO TBEC
40.0000 mg | DELAYED_RELEASE_TABLET | Freq: Every day | ORAL | Status: DC
  Administered 2021-04-14 – 2021-04-15 (×2): 40 mg via ORAL
  Filled 2021-04-13 (×2): qty 1

## 2021-04-13 MED ORDER — ALBUTEROL SULFATE HFA 108 (90 BASE) MCG/ACT IN AERS
2.0000 | INHALATION_SPRAY | Freq: Once | RESPIRATORY_TRACT | Status: AC
  Administered 2021-04-13: 2 via RESPIRATORY_TRACT
  Filled 2021-04-13: qty 6.7

## 2021-04-13 MED ORDER — BUDESONIDE 0.25 MG/2ML IN SUSP
0.2500 mg | Freq: Two times a day (BID) | RESPIRATORY_TRACT | Status: DC
  Administered 2021-04-13 – 2021-04-15 (×4): 0.25 mg via RESPIRATORY_TRACT
  Filled 2021-04-13 (×5): qty 2

## 2021-04-13 NOTE — ED Notes (Signed)
Patient transported to X-ray. Will return to obtain lab work

## 2021-04-13 NOTE — ED Notes (Signed)
Requested room for [facility]. Charge RN states no room available at this time.

## 2021-04-13 NOTE — H&P (Signed)
History and Physical    Robert Rowe CXK:[id number] DOB: 11/05/1918 DOA: 04/13/2021  PCP: [name], MD (Confirm with patient/family/NH records and if not entered, this has to be entered at [facility] point of entry) Patient coming from: Home  I have personally briefly reviewed patient's old medical records in [place]  Chief Complaint: SOB  HPI: Robert Rowe is a 87 y.o. male with medical history significant of chronic diastolic CHF, HTN, CKD stage IV, large hiatal hernia, hypothyroidism, presented with increasing shortness of breath and wheezing.  Symptoms-last night, when patient started to feel wheezy and shortness of breath, also developed orthopnea and could not sleep through the night.  Denied any chest pain no cough.  This morning, wheezing and shortness of breath became worse.  Denies any peripheral edema. ED Course: Patient was found to have mild tachycardia and significant increased blood pressure by EMS.  Chest x-ray showed signs of congestion.  Kidney function wise creatinine 1.5, COVID test negative.  Review of Systems: As per HPI otherwise 14 point review of systems negative.    Past Medical History:  Diagnosis Date  . Acute cholecystitis 12/2018  . BENIGN PROSTATIC HYPERTROPHY 04/05/2007  . 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 CHEST DISCOMFORT 04/25/2009  . Chronic diastolic CHF (congestive heart failure) ([place])    a. Echo 12/16: mild LVH, EF 55-60%, no RWMA, Gr 1 DD, mild AI, MAC, mild LAE, normal RVF, PASP 37 mmHg  . Chronic kidney disease    chronic   . DJD (degenerative joint disease)   . Dysrhythmia    pvc's   . GERD 12/03/2007  . Hiatal hernia   . History of nuclear stress test    Myoview 1/17: EF 53%, normal perfusion; Low Risk  . HYPERLIPIDEMIA 04/05/2007  . Hypothyroidism   . Osteoarth NOS-Unspec 04/05/2007  . Palpitations 02/12/2010  . PEDAL EDEMA 05/01/2008  . Rheumatoid arthritis(714.0) 04/05/2007  . Shortness of breath  dyspnea   . TRANSIENT ISCHEMIC ATTACK, HX OF 04/05/2007   ?     Past Surgical History:  Procedure Laterality Date  . BALLOON DILATION N/A 03/12/2013   Procedure: BALLOON DILATION;  Surgeon: Inda Castle, MD;  Location: [facility];  Service: Endoscopy;  Laterality: N/A;  . CARPAL TUNNEL RELEASE  11/28/2018   Left hand  . CATARACT EXTRACTION  2001   bilateral  . CYSTOSCOPY WITH URETHRAL DILATATION N/A 09/24/2015   Procedure: CYSTOSCOPY WITH COOK BALLOON DILATATION OF URETHRAL STRICTURE ;  Surgeon: Carolan Clines, MD;  Location: [facility];  Service: Urology;  Laterality: N/A;  . CYSTOSCOPY WITH URETHRAL DILATATION N/A 07/20/2016   Procedure: CYSTOSCOPY WITH URETHRAL DILATATION;  Surgeon: Alexis Frock, MD;  Location: [facility];  Service: Urology;  Laterality: N/A;  1 HOUR  . ESOPHAGOGASTRODUODENOSCOPY N/A 03/12/2013   Procedure: ESOPHAGOGASTRODUODENOSCOPY (EGD);  Surgeon: Inda Castle, MD;  Location: [facility];  Service: Endoscopy;  Laterality: N/A;  . HEMORRHOID SURGERY    . HERNIA REPAIR     inguinal x5  . KNEE SURGERY  arthroscopic   left  . PROSTATE CRYOABLATION     2000  . SHOULDER SURGERY  few years ago   left  . TRANSURETHRAL RESECTION OF PROSTATE N/A 03/19/2015   Procedure: TRANSURETHRAL RESECTION OF THE PROSTATE (TURP);  Surgeon: Carolan Clines, MD;  Location: [facility];  Service: Urology;  Laterality: N/A;     reports that he quit smoking about 67 years ago. His smoking use included  cigarettes. He has never used smokeless tobacco. He reports current alcohol use. He reports that he does not use drugs.  Allergies  Allergen Reactions  . Codeine Other (See Comments)    Patient was "all over the place"- undesired side effect  . Sulfasalazine Other (See Comments)    Reaction not recalled  . Sulfonamide Derivatives Other (See Comments)    Reaction not recalled    Family History  Problem Relation Age of Onset  . Breast cancer Mother   . Heart disease Father   .  Heart attack Father   . Lymphoma Brother   . Stroke Neg Hx   . Hypertension Neg Hx      Prior to Admission medications   Medication Sig Start Date End Date Taking? Authorizing Provider  aspirin EC 81 MG tablet Take 81 mg by mouth daily.   Yes [provider]  atorvastatin (LIPITOR) 40 MG tablet TAKE 1 TABLET (40 MG TOTAL) BY MOUTH DAILY AT 6 PM. 02/16/21  Yes Burchette, Alinda Sierras, MD  clopidogrel (PLAVIX) 75 MG tablet TAKE 1 TABLET BY MOUTH  DAILY 08/01/20  Yes Burchette, Alinda Sierras, MD  levothyroxine (SYNTHROID) 50 MCG tablet TAKE 1 TABLET BY MOUTH  DAILY 11/16/20  Yes Burchette, Alinda Sierras, MD  magnesium hydroxide (MILK OF MAGNESIA) 400 MG/5ML suspension Constipation (1 of 4 ) If no BM in 3 days, give 30 cc Milk of Magnesium p.o. x 1 dose in 24 hours as needed ( Do  Not use standing constipation orders for residents with renal failure CFR less than 30. Contact MD for orders)   Yes [provider]  meclizine (ANTIVERT) 12.5 MG tablet Take 1 tablet (12.5 mg total) by mouth 3 (three) times daily as needed for dizziness. 01/10/19  Yes Angiulli, Lavon Paganini, PA-C  metoprolol succinate (TOPROL-XL) 25 MG 24 hr tablet TAKE 1 TABLET BY MOUTH AT  BEDTIME 04/14/20  Yes Burchette, Alinda Sierras, MD  nitroGLYCERIN (NITROSTAT) 0.4 MG SL tablet Place 1 tablet (0.4 mg total) under the tongue every 5 (five) minutes as needed (for Esophageal spasms). 03/22/21  Yes Burchette, Alinda Sierras, MD  pantoprazole (PROTONIX) 40 MG tablet TAKE 1 TABLET BY MOUTH EVERY DAY 07/07/20  Yes Burchette, Alinda Sierras, MD  senna-docusate (SENOKOT-S) 8.6-50 MG tablet Take 1 tablet by mouth at bedtime as needed for moderate constipation. 05/20/19  Yes Georgette Shell, MD  albuterol (PROAIR HFA) 108 (90 Base) MCG/ACT inhaler INHALE 2 PUFFS INTO THE LUNGS EVERY 6 (SIX) HOURS AS NEEDED FOR WHEEZING (COUGH). 08/24/20   Burchette, Alinda Sierras, MD  ketoconazole (NIZORAL) 2 % cream APPLY 1 APPLICATION TOPICALLY 2 (TWO) TIMES DAILY AS NEEDED FOR IRRITATION.  06/12/20   Laurey Morale, MD  latanoprost (XALATAN) 0.005 % ophthalmic solution Place 1 drop into both eyes at bedtime. For Glaucoma 02/08/18   [provider]  Multiple Vitamin (MULTIVITAMIN WITH MINERALS) TABS tablet Take 1 tablet by mouth daily with breakfast.     [provider]  triamcinolone (KENALOG) 0.1 % APPLY TO AFFECTED RASH TWICE DAILY AS NEEDED. 10/14/20   [name], MD    Physical Exam: Vitals:   04/13/21 1457 04/13/21 1728 04/13/21 1755 04/13/21 1833  BP: (!) 165/86   121/76  Pulse: 93 (!) 106 (!) 102 100  Resp: (!) 22 (!) 21  20  Temp: 98.2 F (36.8 C)     TempSrc: Oral     SpO2: 99% 95% 95% 98%  Weight: 63.5 kg  Height: _0  (1.575 m)       Constitutional: NAD, calm, comfortable Vitals:   04/13/21 1457 04/13/21 1728 04/13/21 1755 04/13/21 1833  BP: (!) 165/86   121/76  Pulse: 93 (!) 106 (!) 102 100  Resp: (!) 22 (!) 21  20  Temp: 98.2 F (36.8 C)     TempSrc: Oral     SpO2: 99% 95% 95% 98%  Weight: 63.5 kg     Height: _1  (1.575 m)      Eyes: PERRL, lids and conjunctivae normal ENMT: Mucous membranes are moist. Posterior pharynx clear of any exudate or lesions.Normal dentition.  Neck: normal, supple, no masses, no thyromegaly Respiratory: clear to auscultation bilaterally, scattered wheezing, fine crackles on bilateral bases.  Increasing respiratory effort. No accessory muscle use.  Cardiovascular: Regular rate and rhythm, no murmurs / rubs / gallops. No extremity edema. 2+ pedal pulses. No carotid bruits.  Abdomen: no tenderness, no masses palpated. No hepatosplenomegaly. Bowel sounds positive.  Musculoskeletal: no clubbing / cyanosis. No joint deformity upper and lower extremities. Good ROM, no contractures. Normal muscle tone.  Skin: no rashes, lesions, ulcers. No induration Neurologic: CN 2-12 grossly intact. Sensation intact, DTR normal. Strength 5/5 in all 4.  Psychiatric: Normal judgment and insight. Alert and oriented  x 3. Normal mood.     Labs on Admission: I have personally reviewed following labs and imaging studies  CBC: Recent Labs  Lab 04/13/21 1542  WBC 11.3*  NEUTROABS 8.1*  HGB 14.1  HCT 45.2  MCV 101.8*  PLT PLATELET CLUMPS NOTED ON SMEAR, UNABLE TO ESTIMATE   Basic Metabolic Panel: Recent Labs  Lab 04/13/21 1542  NA 136  K 4.6  CL 104  CO2 22  GLUCOSE 116*  BUN 23  CREATININE 1.58*  CALCIUM 8.7*   GFR: Estimated Creatinine Clearance: 19.2 mL/min (A) (by C-G formula based on SCr of 1.58 mg/dL (H)). Liver Function Tests: No results for input(s): AST, ALT, ALKPHOS, BILITOT, PROT, ALBUMIN in the last 168 hours. No results for input(s): LIPASE, AMYLASE in the last 168 hours. No results for input(s): AMMONIA in the last 168 hours. Coagulation Profile: No results for input(s): INR, PROTIME in the last 168 hours. Cardiac Enzymes: No results for input(s): CKTOTAL, CKMB, CKMBINDEX, TROPONINI in the last 168 hours. BNP (last 3 results) No results for input(s): PROBNP in the last 8760 hours. HbA1C: No results for input(s): HGBA1C in the last 72 hours. CBG: No results for input(s): GLUCAP in the last 168 hours. Lipid Profile: No results for input(s): CHOL, HDL, LDLCALC, TRIG, CHOLHDL, LDLDIRECT in the last 72 hours. Thyroid Function Tests: No results for input(s): TSH, T4TOTAL, FREET4, T3FREE, THYROIDAB in the last 72 hours. Anemia Panel: No results for input(s): VITAMINB12, FOLATE, FERRITIN, TIBC, IRON, RETICCTPCT in the last 72 hours. Urine analysis:    Component Value Date/Time   COLORURINE YELLOW 01/13/2019 0027   APPEARANCEUR CLEAR 01/13/2019 0027   LABSPEC 1.025 01/13/2019 0027   PHURINE 6.0 01/13/2019 0027   GLUCOSEU NEGATIVE 01/13/2019 0027   HGBUR NEGATIVE 01/13/2019 0027   BILIRUBINUR NEGATIVE 01/13/2019 0027   KETONESUR NEGATIVE 01/13/2019 0027   PROTEINUR NEGATIVE 01/13/2019 0027   UROBILINOGEN 0.2 08/27/2015 1930   NITRITE NEGATIVE 01/13/2019 0027    LEUKOCYTESUR NEGATIVE 01/13/2019 0027    Radiological Exams on Admission: DG Chest 2 View  Result Date: 04/13/2021 CLINICAL DATA:  Shortness of breath EXAM: CHEST - 2 VIEW COMPARISON:  05/16/2019 FINDINGS: The mediastinal contours are within normal limits.  No cardiomegaly. Persistent retrocardiac opacity compatible with known hiatal hernia. Bibasilar subsegmental atelectasis. The lungs are otherwise clear bilaterally without evidence of focal consolidation, pleural effusion, or pneumothorax. Multilevel degenerative changes of the thoracic spine. No acute osseous abnormality. Diffuse osteopenia. IMPRESSION: 1. Bibasilar subsegmental atelectasis. 2. Similar appearing hiatal hernia. Electronically Signed   By: Ruthann Cancer MD   On: 04/13/2021 16:16    EKG: Independently reviewed.  Sinus tachycardia  Assessment/Plan Active Problems:   Acute diastolic CHF (congestive heart failure) ([facility])   CHF (congestive heart failure) ([place])  (please populate well all problems here in Problem List. (For example, if patient is on BP meds at home and you resume or decide to hold them, it is a problem that needs to be her. Same for CAD, COPD, HLD and so on)  Acute on chronic diastolic CHF decompensation -Has signs of fluid overload, has orthopnea as well as crackles on physical exam. -1 dose of IV Lasix 20 mg given. -Increase metoprolol from 25 mg daily to 25 mg twice daily -Echocardiogram  Bronchospasm -Likely secondary from CHF decompensation however x-ray shows questionable left lower infiltrate versus consolidation, ordered a CT chest without contrast to further characterize. -Mild elevation of leukocytosis, monitor off antibiotics for now. -Breathing treatment, inhaled steroid.  CKD stage IV -Cre level stable, no in fluid overload, IV Lasix did not follow-up morning repeat BMP.  Hypothyroidism -Continue Synthroid.  Uncontrolled hypertension -As above.  CVA -ASA and Plavix  DVT prophylaxis:  Heparin subcu code Status: DNR Family Communication: Wife at bedside Disposition Plan: Expect less than 2 midnight hospital stay Consults called: None Admission status: Telemetry observation   Lequita Halt MD [facility] Pager [phone]  04/13/2021, 7:14 PM

## 2021-04-13 NOTE — Telephone Encounter (Signed)
Kathlee Nations with [facility] called and stated that the patient is using  accessory muscles to breath. He is extremely short of breath and wheezing. She stated she is also able to hear crackles very distinctly. Patient does not have transportation to come into the office. Kathlee Nations and I both agreed that the patient needs to bee seen and she will advise the patient to contact emergency services to be transported to the emergency room.   Will send to Dr. Elease Hashimoto as Juluis Rainier.

## 2021-04-13 NOTE — ED Notes (Signed)
Report attempted rn to attempt again  

## 2021-04-13 NOTE — ED Triage Notes (Signed)
Pt to ED via EMS from home, home health nurse called EMS D/T concern for fluid retention. Upper airway wheeze noted by EMS, family reports this has bene ongoing for past of couple of days. Pt denies SHOB. Pt does report increased tiredness/weakness. Right side paralysis secondary to old stroke. Last vs: 168/98, HR 96, 95%RA. No medications given by EMS.

## 2021-04-13 NOTE — Telephone Encounter (Signed)
Agree with advice as given.  Pt was seen in the ER and admitted.

## 2021-04-13 NOTE — ED Provider Notes (Signed)
[facility] Provider Note   CSN: [id number] Arrival date & time: 04/13/21  1457   History Chief Complaint  Patient presents with  . 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  History obtained from patient's wife at bedside as patient reports that he feels fine and denies any acute complaints. She reports that over the past two months, patient has been experiencing increasing shortness of breath and wheezing. She states that the symptoms worsen with lying flat and have not improved despite the use of an albuterol inhaler which was recently prescribed. Patient has no prior history of pulmonary disease although he has been diagnosed with chronic diastolic heart failure and is not taking a diuretic. Patient has 100 hours a week of home health care. Patient's home health aide and physical therapist have stated that patient has appeared more weak, tired and significantly dyspneic particularly over the past two days. Due to concern of his symptoms, they recommend that he present to the ED for evaluation.    Past Medical History:  Diagnosis Date  . Acute cholecystitis 12/2018  . BENIGN PROSTATIC HYPERTROPHY 04/05/2007  . 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 CHEST DISCOMFORT 04/25/2009  . Chronic diastolic CHF (congestive heart failure) ([place])    a. Echo 12/16: mild LVH, EF 55-60%, no RWMA, Gr 1 DD, mild AI, MAC, mild LAE, normal RVF, PASP 37 mmHg  . Chronic kidney disease    chronic   . DJD (degenerative joint disease)   . Dysrhythmia    pvc's   . GERD 12/03/2007  . Hiatal hernia   . History of nuclear stress test    Myoview 1/17: EF 53%, normal perfusion; Low Risk   . HYPERLIPIDEMIA 04/05/2007  . Hypothyroidism   . Osteoarth NOS-Unspec 04/05/2007  . Palpitations 02/12/2010  . PEDAL EDEMA 05/01/2008  . Rheumatoid arthritis(714.0) 04/05/2007  . Shortness of breath dyspnea   . TRANSIENT ISCHEMIC ATTACK, HX OF 04/05/2007   ?    Patient Active Problem List   Diagnosis Date Noted  . Palliative care by specialist   . DNR (do not resuscitate) discussion   . Acute cholecystitis 01/14/2019  . Cholecystitis   . 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 Gait disturbance, post-stroke   . BPV (benign positional vertigo) 01/02/2019  . History of stroke 01/01/2019  . TIA (transient ischemic attack) 12/31/2018  . CVA (cerebral vascular accident) ([place]) 12/31/2018  . Chest pain 05/15/2018  . CKD (chronic kidney disease) stage 3, GFR 30-59 ml/min (HCC) 05/08/2018  . Weakness generalized 06/13/2017  . Abnormal diffusion capacity determined by pulmonary function test 06/13/2017  . Chronic diastolic CHF (congestive heart failure) ([place]) 11/01/2016  . Essential hypertension 11/01/2016  . Intracranial vascular stenosis 11/09/2015  . Mild cognitive impairment 11/09/2015  . 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 Benign prostatic hyperplasia with urinary obstruction 03/19/2015  . Awareness alteration, transient 02/26/2015  . PVC's (premature ventricular contractions) 08/22/2013  . Hypothyroidism 05/09/2013  . Stricture and stenosis of esophagus 03/12/2013  . Dysphagia 02/22/2013  . DOE (dyspnea on exertion) 03/23/2011  . PALPITATIONS 02/12/2010  . CHEST DISCOMFORT 04/25/2009  . PEDAL EDEMA 05/01/2008  . GERD 12/03/2007  . Dyslipidemia 04/05/2007  . BENIGN  PROSTATIC HYPERTROPHY 04/05/2007  . Rheumatoid arthritis ([place]) 04/05/2007  . Osteoarthritis 04/05/2007  . History of cardiovascular disorder 04/05/2007   Past Surgical History:  Procedure Laterality Date  . BALLOON DILATION N/A 03/12/2013   Procedure: BALLOON DILATION;  Surgeon: Inda Castle, MD;   Location: [facility];  Service: Endoscopy;  Laterality: N/A;  . CARPAL TUNNEL RELEASE  11/28/2018   Left hand  . CATARACT EXTRACTION  2001   bilateral  . CYSTOSCOPY WITH URETHRAL DILATATION N/A 09/24/2015   Procedure: CYSTOSCOPY WITH COOK BALLOON DILATATION OF URETHRAL STRICTURE ;  Surgeon: Carolan Clines, MD;  Location: [facility];  Service: Urology;  Laterality: N/A;  . CYSTOSCOPY WITH URETHRAL DILATATION N/A 07/20/2016   Procedure: CYSTOSCOPY WITH URETHRAL DILATATION;  Surgeon: Alexis Frock, MD;  Location: [facility];  Service: Urology;  Laterality: N/A;  1 HOUR  . ESOPHAGOGASTRODUODENOSCOPY N/A 03/12/2013   Procedure: ESOPHAGOGASTRODUODENOSCOPY (EGD);  Surgeon: Inda Castle, MD;  Location: [facility];  Service: Endoscopy;  Laterality: N/A;  . HEMORRHOID SURGERY    . HERNIA REPAIR     inguinal x5  . KNEE SURGERY  arthroscopic   left  . PROSTATE CRYOABLATION     2000  . SHOULDER SURGERY  few years ago   left  . TRANSURETHRAL RESECTION OF PROSTATE N/A 03/19/2015   Procedure: TRANSURETHRAL RESECTION OF THE PROSTATE (TURP);  Surgeon: Carolan Clines, MD;  Location: [facility];  Service: Urology;  Laterality: N/A;    Family History  Problem Relation Age of Onset  . Breast cancer Mother   . Heart disease Father   . Heart attack Father   . Lymphoma Brother   . Stroke Neg Hx   . Hypertension Neg Hx    Social History   Tobacco Use  . Smoking status: Former Smoker    Types: Cigarettes    Quit date: 08/13/1953    Years since quitting: 67.7  . Smokeless tobacco: Never Used  . Tobacco comment: [id number] up to 1 pack/day  Vaping Use  . Vaping Use: Never used  Substance Use Topics  . Alcohol use: Yes    Comment: occ vodka  . Drug use: No   Home Medications Prior to Admission medications   Medication Sig Start Date End Date Taking? Authorizing Provider  albuterol (PROAIR HFA) 108 (90 Base) MCG/ACT inhaler INHALE 2 PUFFS INTO THE LUNGS EVERY 6 (SIX) HOURS AS NEEDED FOR WHEEZING  (COUGH). 08/24/20   Burchette, Alinda Sierras, MD  aspirin EC 81 MG tablet Take 81 mg by mouth daily.    [provider]  atorvastatin (LIPITOR) 40 MG tablet TAKE 1 TABLET (40 MG TOTAL) BY MOUTH DAILY AT 6 PM. 02/16/21   Burchette, Alinda Sierras, MD  clopidogrel (PLAVIX) 75 MG tablet TAKE 1 TABLET BY MOUTH  DAILY 08/01/20   Burchette, Alinda Sierras, MD  ketoconazole (NIZORAL) 2 % cream APPLY 1 APPLICATION TOPICALLY 2 (TWO) TIMES DAILY AS NEEDED FOR IRRITATION. 06/12/20   Laurey Morale, MD  latanoprost (XALATAN) 0.005 % ophthalmic solution Place 1 drop into both eyes at bedtime. For Glaucoma 02/08/18   [provider]  levothyroxine (SYNTHROID) 50 MCG tablet TAKE 1 TABLET BY MOUTH  DAILY 11/16/20   Burchette, Alinda Sierras, MD  magnesium hydroxide (MILK OF MAGNESIA) 400 MG/5ML suspension Constipation (1 of 4 ) If no BM in 3 days, give 30 cc Milk of Magnesium p.o. x 1 dose in 24 hours as needed ( Do  Not use standing constipation orders for residents with renal failure  CFR less than 30. Contact MD for orders)    [provider]  meclizine (ANTIVERT) 12.5 MG tablet Take 1 tablet (12.5 mg total) by mouth 3 (three) times daily as needed for dizziness. 01/10/19   Angiulli, Lavon Paganini, PA-C  metoprolol succinate (TOPROL-XL) 25 MG 24 hr tablet TAKE 1 TABLET BY MOUTH AT  BEDTIME 04/14/20   Burchette, Alinda Sierras, MD  Multiple Vitamin (MULTIVITAMIN WITH MINERALS) TABS tablet Take 1 tablet by mouth daily with breakfast.     [provider]  nitroGLYCERIN (NITROSTAT) 0.4 MG SL tablet Place 1 tablet (0.4 mg total) under the tongue every 5 (five) minutes as needed (for Esophageal spasms). 03/22/21   Burchette, Alinda Sierras, MD  pantoprazole (PROTONIX) 40 MG tablet TAKE 1 TABLET BY MOUTH EVERY DAY 07/07/20   Burchette, Alinda Sierras, MD  senna-docusate (SENOKOT-S) 8.6-50 MG tablet Take 1 tablet by mouth at bedtime as needed for moderate constipation. 05/20/19   Georgette Shell, MD  triamcinolone (KENALOG) 0.1 % APPLY TO  AFFECTED RASH TWICE DAILY AS NEEDED. 10/14/20   Burchette, Alinda Sierras, MD   Allergies    Codeine, Sulfasalazine, and Sulfonamide derivatives  Review of Systems   Review of Systems  Constitutional: Negative for chills and fever.  HENT: Negative for ear pain and sore throat.   Eyes: Negative for pain and visual disturbance.  Respiratory: Positive for shortness of breath and wheezing. Negative for cough.   Cardiovascular: Negative for chest pain and palpitations.  Gastrointestinal: Negative for abdominal pain and vomiting.  Genitourinary: Negative for dysuria and hematuria.  Musculoskeletal: Negative for arthralgias and back pain.  Skin: Negative for color change and rash.  Neurological: Negative for seizures and syncope.  All other systems reviewed and are negative.   Physical Exam Updated Vital Signs BP 121/76 (BP Location: Left Arm)   Pulse 100   Temp 98.2 F (36.8 C) (Oral)   Resp 20   Ht _0  (1.575 m)   Wt 63.5 kg   SpO2 98%   BMI 25.61 kg/m   Physical Exam Vitals and nursing note reviewed.  Constitutional:      Appearance: He is well-developed.     Comments: Pleasant, elderly man supine in ED bed with increased work of breathing  HENT:     Head: Normocephalic and atraumatic.  Eyes:     Extraocular Movements: Extraocular movements intact.     Conjunctiva/sclera: Conjunctivae normal.  Cardiovascular:     Rate and Rhythm: Normal rate and regular rhythm.     Pulses: Normal pulses.     Heart sounds: Normal heart sounds. No murmur heard.   Pulmonary:     Breath sounds: Wheezing and rales present.     Comments: Increased work of breathing, end expiratory wheezing and rales in bilateral lower lung fields Abdominal:     Palpations: Abdomen is soft.     Tenderness: There is no abdominal tenderness.  Musculoskeletal:     Cervical back: Neck supple.     Right lower leg: No edema.     Left lower leg: No edema.     Comments: No pitting edema of bilateral lower  extremities  Skin:    General: Skin is warm and dry.     Capillary Refill: Capillary refill takes less than 2 seconds.  Neurological:     General: No focal deficit present.     Mental Status: He is alert. Mental status is at baseline.  Psychiatric:        Mood and  Affect: Mood normal.        Behavior: Behavior normal.    ED Results / Procedures / Treatments   Labs (all labs ordered are listed, but only abnormal results are displayed) Labs Reviewed  BASIC METABOLIC PANEL - Abnormal; Notable for the following components:      Result Value   Glucose, Bld 116 (*)    Creatinine, Ser 1.58 (*)    Calcium 8.7 (*)    GFR, Estimated 39 (*)    All other components within normal limits  CBC WITH DIFFERENTIAL/PLATELET - Abnormal; Notable for the following components:   WBC 11.3 (*)    MCV 101.8 (*)    Neutro Abs 8.1 (*)    Monocytes Absolute 1.3 (*)    All other components within normal limits  BRAIN NATRIURETIC PEPTIDE - Abnormal; Notable for the following components:   B Natriuretic Peptide 300.9 (*)    All other components within normal limits  RESP PANEL BY RT-PCR (FLU A&B, COVID) ARPGX2  D-DIMER, QUANTITATIVE (NOT AT [facility])  TROPONIN I (HIGH SENSITIVITY)  TROPONIN I (HIGH SENSITIVITY)   EKG None  Radiology DG Chest 2 View  Result Date: 04/13/2021 CLINICAL DATA:  Shortness of breath EXAM: CHEST - 2 VIEW COMPARISON:  05/16/2019 FINDINGS: The mediastinal contours are within normal limits. No cardiomegaly. Persistent retrocardiac opacity compatible with known hiatal hernia. Bibasilar subsegmental atelectasis. The lungs are otherwise clear bilaterally without evidence of focal consolidation, pleural effusion, or pneumothorax. Multilevel degenerative changes of the thoracic spine. No acute osseous abnormality. Diffuse osteopenia. IMPRESSION: 1. Bibasilar subsegmental atelectasis. 2. Similar appearing hiatal hernia. Electronically Signed   By: Ruthann Cancer MD   On: 04/13/2021 16:16     Medications Ordered in ED Medications  ipratropium-albuterol (DUONEB) 0.5-2.5 (3) MG/3ML nebulizer solution 3 mL (has no administration in time range)  albuterol (VENTOLIN HFA) 108 (90 Base) MCG/ACT inhaler 2 puff (2 puffs Inhalation Given 04/13/21 1619)    ED Course  I have reviewed the triage vital signs and the nursing notes.  Pertinent labs & imaging results that were available during my care of the patient were reviewed by me and considered in my medical decision making (see chart for details).    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 Patient is tachypneic but otherwise saturating well on room air. On physical examination, he has increased work of breathing with end expiratory wheezing and rales in lower lung fields, however no evidence of significant volume overload. Differential for patient's symptoms includes acute on chronic heart failure, infectious etiology, pulmonary embolism, and acute coronary syndrome. Will obtain BNP, CBC, D-dimer, troponin, respiratory panel and BMP as well as chest x-ray. Will provide patient with 2 puffs of albuterol for his dyspnea.  Patient's workup most remarkable for elevated BNP to 300.9 and bibasilar atelectasis. Patient's presentation most likely secondary to acute on chronic heart failure. Patient's last echocardiogram from 2020. Patient would benefit from admission for cardiac monitoring, repeat echocardiogram, initiation of diuresis with careful monitoring of renal function. Consult placed for unassigned admission.  Discussed case with Dr. Roosevelt Locks who will admit patient.  Final Clinical Impression(s) / ED Diagnoses Final diagnoses:  Acute on chronic heart failure, unspecified  heart failure type [facility]    Rx / DC Orders ED  Discharge Orders    None       Cato Mulligan, MD 04/13/21 1837    Tegeler, Gwenyth Allegra, MD 04/14/21 [phone]

## 2021-04-14 ENCOUNTER — Observation Stay (HOSPITAL_COMMUNITY): Payer: Medicare Other

## 2021-04-14 DIAGNOSIS — G3184 Mild cognitive impairment, so stated: Secondary | ICD-10-CM | POA: Diagnosis present

## 2021-04-14 DIAGNOSIS — I509 Heart failure, unspecified: Secondary | ICD-10-CM

## 2021-04-14 DIAGNOSIS — I5031 Acute diastolic (congestive) heart failure: Secondary | ICD-10-CM

## 2021-04-14 DIAGNOSIS — J9801 Acute bronchospasm: Secondary | ICD-10-CM | POA: Diagnosis present

## 2021-04-14 DIAGNOSIS — I5033 Acute on chronic diastolic (congestive) heart failure: Secondary | ICD-10-CM | POA: Diagnosis present

## 2021-04-14 DIAGNOSIS — I69351 Hemiplegia and hemiparesis following cerebral infarction affecting right dominant side: Secondary | ICD-10-CM | POA: Diagnosis not present

## 2021-04-14 DIAGNOSIS — R5381 Other malaise: Secondary | ICD-10-CM | POA: Diagnosis present

## 2021-04-14 DIAGNOSIS — Z79890 Hormone replacement therapy (postmenopausal): Secondary | ICD-10-CM | POA: Diagnosis not present

## 2021-04-14 DIAGNOSIS — Z20822 Contact with and (suspected) exposure to covid-19: Secondary | ICD-10-CM | POA: Diagnosis present

## 2021-04-14 DIAGNOSIS — K219 Gastro-esophageal reflux disease without esophagitis: Secondary | ICD-10-CM | POA: Diagnosis present

## 2021-04-14 DIAGNOSIS — Z79899 Other long term (current) drug therapy: Secondary | ICD-10-CM | POA: Diagnosis not present

## 2021-04-14 DIAGNOSIS — M069 Rheumatoid arthritis, unspecified: Secondary | ICD-10-CM | POA: Diagnosis present

## 2021-04-14 DIAGNOSIS — E785 Hyperlipidemia, unspecified: Secondary | ICD-10-CM | POA: Diagnosis present

## 2021-04-14 DIAGNOSIS — D72828 Other elevated white blood cell count: Secondary | ICD-10-CM | POA: Diagnosis present

## 2021-04-14 DIAGNOSIS — Z7982 Long term (current) use of aspirin: Secondary | ICD-10-CM | POA: Diagnosis not present

## 2021-04-14 DIAGNOSIS — Z7902 Long term (current) use of antithrombotics/antiplatelets: Secondary | ICD-10-CM | POA: Diagnosis not present

## 2021-04-14 DIAGNOSIS — R600 Localized edema: Secondary | ICD-10-CM | POA: Diagnosis present

## 2021-04-14 DIAGNOSIS — Z87891 Personal history of nicotine dependence: Secondary | ICD-10-CM | POA: Diagnosis not present

## 2021-04-14 DIAGNOSIS — Z7401 Bed confinement status: Secondary | ICD-10-CM | POA: Diagnosis not present

## 2021-04-14 DIAGNOSIS — E039 Hypothyroidism, unspecified: Secondary | ICD-10-CM | POA: Diagnosis present

## 2021-04-14 DIAGNOSIS — I130 Hypertensive heart and chronic kidney disease with heart failure and stage 1 through stage 4 chronic kidney disease, or unspecified chronic kidney disease: Secondary | ICD-10-CM | POA: Diagnosis present

## 2021-04-14 DIAGNOSIS — Z66 Do not resuscitate: Secondary | ICD-10-CM | POA: Diagnosis present

## 2021-04-14 DIAGNOSIS — N184 Chronic kidney disease, stage 4 (severe): Secondary | ICD-10-CM | POA: Diagnosis present

## 2021-04-14 LAB — BASIC METABOLIC PANEL
Anion gap: 9 (ref 5–15)
BUN: 24 mg/dL — ABNORMAL HIGH (ref 8–23)
CO2: 27 mmol/L (ref 22–32)
Calcium: 8.8 mg/dL — ABNORMAL LOW (ref 8.9–10.3)
Chloride: 103 mmol/L (ref 98–111)
Creatinine, Ser: 1.79 mg/dL — ABNORMAL HIGH (ref 0.61–1.24)
GFR, Estimated: 33 mL/min — ABNORMAL LOW (ref >60)
Glucose, Bld: 153 mg/dL — ABNORMAL HIGH (ref 70–99)
Potassium: 4.1 mmol/L (ref 3.5–5.1)
Sodium: 139 mmol/L (ref 135–145)

## 2021-04-14 LAB — ECHOCARDIOGRAM COMPLETE
Area-P 1/2: 3.31 cm2
Height: 62 in
Weight: 2356.28 oz

## 2021-04-14 MED ORDER — PERFLUTREN LIPID MICROSPHERE
1.0000 mL | INTRAVENOUS | Status: AC | PRN
Start: 2021-04-14 — End: 2021-04-14
  Administered 2021-04-14: 2 mL via INTRAVENOUS
  Filled 2021-04-14: qty 10

## 2021-04-14 NOTE — Progress Notes (Signed)
Pt's Son called RN this morning for update and plan of care multiple times this morning. This RN spoke with him regarding plan of care with patient's permission.   Pt's spouse also called this morning regarding plan of care. This nurse spoke with pt's spouse with patient permission regarding plan of care.   Pt's spouse arrived to unit during lunch time.  Updated plan of care was discussed with pt's spouse.  Pt's spouse was tearful and expressing and stated : " I was to be updated first before anyone else, I have been and am the person that looks after him"  Also states that : " I am very tired, so as soon as the doctor comes an talks to me I will go home and rest"  [place] MD was made aware of pt's wife request.   Pt's wife  Is currently standing at the side of pt's bed.  Pt in bed with call bell within reach.

## 2021-04-14 NOTE — Progress Notes (Signed)
Physical Therapy Treatment Patient Details Name: Robert Rowe MRN: [id number] DOB: Nov 20, 1918 Today's Date: 04/14/2021    History of Present Illness Pt is a 87 y.o. male admitted 04/13/21 with acute onset SOB and wheezing. Workup for acute on chronic diastolic CHF decompensation, bronchospasm. Chest CT negative for acute injury; large hiatal hernia containing majority of stomach. PMH includes blind L eye, CKD, TIA, CHF, DJD, OA, RA, hernia.   PT Comments    Pt seen for additional session with wife present to observe current functional mobility status. Pt with improved mobility and movement initiation this session. Able to stand and take a few steps with RW and mod-maxA for stability. Increased time discussing d/c recommendations with wife; wife and pt prefer for pt to return home with continued assist from family and caregivers; pt not interested in SNF. Wife reports they are able to provide necessary assist level for mobility and ADL tasks. D/c recommendation updates. If pt to remain admitted, will continue to follow acutely.   Follow Up Recommendations  Home health PT;Supervision for mobility/OOB     Equipment Recommendations  None recommended by PT    Recommendations for Other Services       Precautions / Restrictions Precautions Precautions: Fall;Other (comment) Precaution Comments: H/o R-side hemiparesis Restrictions Weight Bearing Restrictions: No    Mobility  Bed Mobility Overal bed mobility: Needs Assistance Bed Mobility: Supine to Sit     Supine to sit: Mod assist     General bed mobility comments: Improved initiation this session with use of BUEs on bed rail to assist; modA for trunk elevation and scooting hips to EOB    Transfers Overall transfer level: Needs assistance Equipment used: Rolling walker (2 wheeled) Transfers: Sit to/from Stand Sit to Stand: Mod assist         General transfer comment: 2x sit<>stands from EOB to RW with heavy modA to elevate  trunk and maintain stability, mod-maxA to stabilize RW; additional stand from recliner with cues for hand placement, modA to elevate trunk  Ambulation/Gait Ambulation/Gait assistance: Mod assist;Max assist Gait Distance (Feet): 1 Feet Assistive device: Rolling walker (2 wheeled) Gait Pattern/deviations: Step-to pattern;Shuffle;Trunk flexed;Leaning posteriorly Gait velocity: Decreased   General Gait Details: Slow, shuffling steps from bed to recliner with RW and mod-maxA to prevent posterior LOB   Stairs             Wheelchair Mobility    Modified Rankin (Stroke Patients Only)       Balance Overall balance assessment: Needs assistance   Sitting balance-Leahy Scale: Fair Sitting balance - Comments: Able to maintain static sitting without UE support     Standing balance-Leahy Scale: Poor Standing balance comment: Reliant on UE support and external assist; dependent for posterior pericare                            Cognition Arousal/Alertness: Awake/alert Behavior During Therapy: WFL for tasks assessed/performed Overall Cognitive Status: History of cognitive impairments - at baseline Area of Impairment: Attention;Memory;Following commands;Safety/judgement;Awareness;Problem solving                   Current Attention Level: Sustained;Selective Memory: Decreased short-term memory Following Commands: Follows one step commands with increased time;Follows one step commands inconsistently Safety/Judgement: Decreased awareness of deficits Awareness: Emergent Problem Solving: Slow processing;Decreased initiation;Difficulty sequencing;Requires verbal cues        Exercises      General Comments General comments (skin integrity, edema, etc.): Pt's  wife present and supportive; increased time discussing routine at home, care available, pt's PLOF and current assist needs. Wife reports necessary assist from herself and caregivers, as well as owning necessary  DME; agreeable to have pt return home; pt declines SNF      Pertinent Vitals/Pain Pain Assessment: No/denies pain    Home Living                      Prior Function            PT Goals (current goals can now be found in the care plan section) Acute Rehab PT Goals Patient Stated Goal: Return home to wife PT Goal Formulation: With patient/family Time For Goal Achievement: 04/28/21 Potential to Achieve Goals: Good Progress towards PT goals: Progressing toward goals    Frequency    Min 3X/week      PT Plan Current plan remains appropriate    Co-evaluation              AM-PAC PT "6 Clicks" Mobility   Outcome Measure  Help needed turning from your back to your side while in a flat bed without using bedrails?: A Lot Help needed moving from lying on your back to sitting on the side of a flat bed without using bedrails?: A Lot Help needed moving to and from a bed to a chair (including a wheelchair)?: A Lot Help needed standing up from a chair using your arms (e.g., wheelchair or bedside chair)?: A Lot Help needed to walk in hospital room?: A Lot Help needed climbing 3-5 steps with a railing? : Total 6 Click Score: 11    End of Session Equipment Utilized During Treatment: Gait belt Activity Tolerance: Patient tolerated treatment well Patient left: in chair;with call bell/phone within reach;with chair alarm set;with family/visitor present Nurse Communication: Mobility status PT Visit Diagnosis: Other abnormalities of gait and mobility (R26.89);Muscle weakness (generalized) (M62.81)     Time: 5329-9242 PT Time Calculation (min) (ACUTE ONLY): 29 min  Charges:  $Therapeutic Activity: 8-22 mins $Self Care/Home Management: [place], PT, DPT Acute Rehabilitation Services  Pager [phone] Office [place] 04/14/2021, 1:16 PM

## 2021-04-14 NOTE — Evaluation (Signed)
Physical Therapy Evaluation Patient Details Name: RAVON MORTELLARO MRN: [id number] DOB: 03-05-1918 Today's Date: 04/14/2021   History of Present Illness  Pt is a 87 y.o. male admitted 04/13/21 with acute onset SOB and wheezing. Workup for acute on chronic diastolic CHF decompensation, bronchospasm. Chest CT negative for acute injury; large hiatal hernia containing majority of stomach. PMH includes blind L eye, CKD, TIA, CHF, DJD, OA, RA, hernia.    Clinical Impression  Pt presents with an overall decrease in functional mobility secondary to above. PTA, pt from home with wife; wife reports pt has caregiver support from 9:30p-3:00p who assist pt with stand pivot transfers to w/c and ADL tasks. Today, pt required maxA to stand with RW, as well as maxA for stand pivot to recliner; assist for breakfast set-up, then able to feed self. DOE 2/4 noted with activity; SpO2 96% on RA, HR 95. Feel pt would benefit from return home to familiar environment if necessary caregiver assist continues to be available; if not, may require SNF-level therapies to maximize functional mobility and independence. Will follow acutely to address established goals.     Follow Up Recommendations Home health PT;Supervision for mobility/OOB (vs. SNF pending necessary assist available)    Equipment Recommendations  None recommended by PT    Recommendations for Other Services       Precautions / Restrictions Precautions Precautions: Fall;Other (comment) Precaution Comments: H/o R-side hemiparesis Restrictions Weight Bearing Restrictions: No      Mobility  Bed Mobility Overal bed mobility: Needs Assistance Bed Mobility: Supine to Sit     Supine to sit: Max assist     General bed mobility comments: Max verbal cues for movement initiation and sequencing, pt able to minimally move BLEs to EOB, assist with BUE support on rail; maxA for trunk elevation and scooting hips to EOB    Transfers Overall transfer level: Needs  assistance Equipment used: Rolling walker (2 wheeled);1 person hand held assist Transfers: Sit to/from [facility] Sit to Stand: Max assist Stand pivot transfers: Max assist       General transfer comment: Initial standing trial from EOB to RW, pt requiring maxA to elevate trunk and stabilize RW, pt bracing BLEs against bed and difficulty translating weight anteriorly; additional stand with BUE support, maxA for pivot to recliner  Ambulation/Gait                Stairs            Wheelchair Mobility    Modified Rankin (Stroke Patients Only)       Balance Overall balance assessment: Needs assistance   Sitting balance-Leahy Scale: Poor Sitting balance - Comments: Reliant on UE support, min guard to minA for balance     Standing balance-Leahy Scale: Zero Standing balance comment: Reliant on UE support and maxA                             Pertinent Vitals/Pain Pain Assessment: No/denies pain    Home Living Family/patient expects to be discharged to:: Private residence Living Arrangements: Spouse/significant other Available Help at Discharge: Family;Personal care attendant Type of Home: House Home Access: Stairs to enter   Technical brewer of Steps: 5   Home Equipment: Environmental consultant - 2 wheels;Wheelchair - manual Additional Comments: Lives at home with wife; has caregivers from 9:30p-3:00p    Prior Function Level of Independence: Needs assistance   Gait / Transfers Assistance Needed: Wife reports assist from caregivers  for transfers to/from wheelchair with RW; requires assist up/down stairs in w/c. Wife drives occasionally ("we don't get out much anymore")  ADL's / Homemaking Assistance Needed: Assist for ADLs; typically wears diapers; assist for meal set-up, able to feed self        Hand Dominance   Dominant Hand: Right    Extremity/Trunk Assessment   Upper Extremity Assessment Upper Extremity Assessment: Generalized  weakness;RUE deficits/detail RUE Deficits / Details: Pt reports "my R side is paralyzed" - noted RUE hemiparesis, suspect prior stroke; R finger/wrist flexion contractures, able to use minimally to grip, as well as noted R elbow flex and shoulder flex/IR positioning, able to partially abduct/flex shoulder    Lower Extremity Assessment Lower Extremity Assessment: Generalized weakness;RLE deficits/detail RLE Deficits / Details: R-side weakness suspect prior stroke; functionally 2-3/5 throughout    Cervical / Trunk Assessment Cervical / Trunk Assessment: Kyphotic  Communication   Communication: HOH  Cognition Arousal/Alertness: Awake/alert Behavior During Therapy: WFL for tasks assessed/performed Overall Cognitive Status: No family/caregiver present to determine baseline cognitive functioning Area of Impairment: Orientation;Attention;Memory;Following commands;Safety/judgement;Awareness;Problem solving                   Current Attention Level: Sustained Memory: Decreased short-term memory Following Commands: Follows one step commands with increased time;Follows one step commands inconsistently Safety/Judgement: Decreased awareness of deficits Awareness: Intellectual Problem Solving: Slow processing;Decreased initiation;Difficulty sequencing;Requires verbal cues General Comments: Suspect likely some baseline cognitive impairment; difficult to determine extend versus exacerbated by [facility]. Pt inconsistent historian regarding PLOF      General Comments General comments (skin integrity, edema, etc.): SpO2 96% on RA, HR 95. Pt required assist for breakfast set-up, then able to feed self. Spoke to wife on phone regarding PLOF, current functional mobilit status, d/c recommendations - wife says TBD on SNF vs. return home with caregiver/family support    Exercises     Assessment/Plan    PT Assessment Patient needs continued PT services  PT Problem List Decreased strength;Decreased range  of motion;Decreased activity tolerance;Decreased balance;Decreased mobility;Decreased cognition;Cardiopulmonary status limiting activity       PT Treatment Interventions DME instruction;Gait training;Functional mobility training;Therapeutic activities;Therapeutic exercise;Balance training;Patient/family education;Wheelchair mobility training    PT Goals (Current goals can be found in the Care Plan section)  Acute Rehab PT Goals Patient Stated Goal: Return home to wife PT Goal Formulation: With patient Time For Goal Achievement: 04/28/21 Potential to Achieve Goals: Good    Frequency Min 3X/week   Barriers to discharge        Co-evaluation               AM-PAC PT "6 Clicks" Mobility  Outcome Measure Help needed turning from your back to your side while in a flat bed without using bedrails?: A Lot Help needed moving from lying on your back to sitting on the side of a flat bed without using bedrails?: A Lot Help needed moving to and from a bed to a chair (including a wheelchair)?: A Lot Help needed standing up from a chair using your arms (e.g., wheelchair or bedside chair)?: A Lot Help needed to walk in hospital room?: Total Help needed climbing 3-5 steps with a railing? : Total 6 Click Score: 10    End of Session Equipment Utilized During Treatment: Gait belt Activity Tolerance: Patient tolerated treatment well Patient left: in chair;with call bell/phone within reach;with chair alarm set Nurse Communication: Mobility status PT Visit Diagnosis: Other abnormalities of gait and mobility (R26.89);Muscle weakness (generalized) (M62.81)  Time: 0820-0856 PT Time Calculation (min) (ACUTE ONLY): 36 min   Charges:   PT Evaluation $PT Eval Moderate Complexity: 1 Mod PT Treatments $Therapeutic Activity: 8-22 mins   Mabeline Caras, PT, DPT Acute Rehabilitation Services  Pager [phone] Office [place] 04/14/2021, 10:24 AM

## 2021-04-14 NOTE — TOC Progression Note (Signed)
Transition of Care [facility]) - Progression Note    Patient Details  Name: Robert Rowe MRN: [id number] Date of Birth: September 22, 1918  Transition [facility] CM/SW [place], [place] Phone Number: 04/14/2021, 10:10 AM  Clinical Narrative:    1008: CSW spoke with pt wife, she stated she wants pt to go home at DC unless MD tells her he needs a SNF. CSW will follow up.        Expected Discharge Plan and Services                                                 Social Determinants of Health (SDOH) Interventions    Readmission Risk Interventions No flowsheet data found.

## 2021-04-14 NOTE — Evaluation (Signed)
Occupational Therapy Evaluation Patient Details Name: Robert Rowe MRN: [id number] DOB: [birth year] Today's Date: 04/14/2021    History of Present Illness Pt is a 87 y.o. male admitted 04/13/21 with acute onset SOB and wheezing. Workup for acute on chronic diastolic CHF decompensation, bronchospasm. Chest CT negative for acute injury; large hiatal hernia containing majority of stomach. PMH includes blind L eye, CKD, TIA, CHF, DJD, OA, RA, hernia.   Clinical Impression   PTA, pt from home with wife; wife reports pt has caregiver support from 9:30p-3:00p who assist pt with stand pivot transfers to w/c and ADL tasks. Pt presents with diagnoses above and deficits in standing balance, endurance and strength. On entry, pt sliding down in chair with reports of sore bottom and request for assist back to bed. Pt overall Mod A for sit to stand and max A for pivot back to bed with RW. Hands on assist needed throughout to maintain standing balance/prevent fall. At this time, pt requires Mod A for UB ADLs and up to Total A for LB ADLs. On return to bed, pt reporting need for BM - assisted onto bedpan with NT/RN aware. Pt would likely need +2 support for toileting tasks currently (one to assist with hygiene and another to maintain balance). Pt/wife set on plan to return home with [facility] services and caregiver support, rather than SNF. If increased physical assist unable to be provided in home environment, pt would benefit from short term rehab at [facility].     Follow Up Recommendations  Home health OT;Supervision/Assistance - 24 hour (SNF rehab if family/caregivers unable to provide the physical assist required for pt)    Equipment Recommendations  None recommended by OT (appears well equipped)    Recommendations for Other Services       Precautions / Restrictions Precautions Precautions: Fall;Other (comment) Precaution Comments: H/o R-side hemiparesis Restrictions Weight Bearing Restrictions: No       Mobility Bed Mobility Overal bed mobility: Needs Assistance Bed Mobility: Sit to Supine     Supine to sit: Mod assist Sit to supine: Mod assist   General bed mobility comments: Mod A to guide hips/LEs back to bed, pt able to advance trunk to bed    Transfers Overall transfer level: Needs assistance Equipment used: Rolling walker (2 wheeled) Transfers: Sit to/from [facility] Sit to Stand: Mod assist Stand pivot transfers: Max assist       General transfer comment: Mod A for power up fully, Max A for pivot from chair back to bed with hands on assist required throughout due to poor balance    Balance Overall balance assessment: Needs assistance   Sitting balance-Leahy Scale: Fair Sitting balance - Comments: Able to maintain static sitting without UE support     Standing balance-Leahy Scale: Poor Standing balance comment: Reliant on UE support and external assist.                           ADL either performed or assessed with clinical judgement   ADL Overall ADL's : Needs assistance/impaired Eating/Feeding: Set up;Sitting   Grooming: Set up;Sitting   Upper Body Bathing: Moderate assistance;Sitting   Lower Body Bathing: Maximal assistance;+2 for safety/equipment;Sit to/from stand   Upper Body Dressing : Moderate assistance;Sitting   Lower Body Dressing: Maximal assistance;+2 for safety/equipment;Sit to/from stand   Toilet Transfer: Maximal assistance;Stand-pivot;RW Toilet Transfer Details (indicate cue type and reason): simulated from chair to bed Toileting- Clothing Manipulation and Hygiene: Total assistance;Bed  level;Sit to/from stand Toileting - Clothing Manipulation Details (indicate cue type and reason): On return to bed, pt reporting having BM. Prepped washcloths to assist with cleaning and noted small amount on pad - pt reports "holding it". Located and provided bedpan in place. Educated pt/wife for pt to avoid sitting on bedpan  for prolonged time for skin integrity -RN/NT aware       General ADL Comments: Pt with significant standing balance deficits, as well as strength and endurance requiring increased assist for transfers/LB ADLs     Vision Baseline Vision/History: Wears glasses Wears Glasses: At all times Patient Visual Report: No change from baseline Vision Assessment?: No apparent visual deficits     Perception     Praxis      Pertinent Vitals/Pain Pain Assessment: Faces Faces Pain Scale: Hurts little more Pain Location: bottom Pain Descriptors / Indicators: Discomfort;Grimacing;Sore Pain Intervention(s): Monitored during session;Repositioned     Hand Dominance Right   Extremity/Trunk Assessment Upper Extremity Assessment Upper Extremity Assessment: Generalized weakness;RUE deficits/detail RUE Deficits / Details: Pt reports "my R side is paralyzed" - noted RUE hemiparesis, suspect prior stroke; R finger/wrist flexion contractures, able to use minimally to grip, as well as noted R elbow flex and shoulder flex/IR positioning, able to partially abduct/flex shoulder RUE Coordination: decreased gross motor;decreased fine motor   Lower Extremity Assessment Lower Extremity Assessment: Defer to PT evaluation   Cervical / Trunk Assessment Cervical / Trunk Assessment: Kyphotic   Communication Communication Communication: HOH   Cognition Arousal/Alertness: Awake/alert Behavior During Therapy: WFL for tasks assessed/performed Overall Cognitive Status: History of cognitive impairments - at baseline Area of Impairment: Attention;Memory;Following commands;Safety/judgement;Awareness;Problem solving                   Current Attention Level: Sustained;Selective Memory: Decreased short-term memory Following Commands: Follows one step commands with increased time;Follows one step commands inconsistently Safety/Judgement: Decreased awareness of deficits Awareness: Emergent Problem Solving: Slow  processing;Decreased initiation;Difficulty sequencing;Requires verbal cues General Comments: Suspect likely some baseline cognitive impairment; difficult to determine extend versus exacerbated by [facility]. Pt inconsistent historian regarding PLOF   General Comments  Pt's wife present during session and supportive. Both wife/pt declining SNF and report desire to return home    Exercises     Shoulder Instructions      Home Living Family/patient expects to be discharged to:: Private residence Living Arrangements: Spouse/significant other Available Help at Discharge: Family;Personal care attendant Type of Home: House Home Access: Stairs to enter [employer] of Steps: [place]: [employer] - 2 wheels;Wheelchair - [employer];Other (comment) (bedpans)   Additional Comments: Lives at home with wife; has caregivers from 9:30p-3:00p      Prior Functioning/Environment Level of Independence: Needs assistance  Gait / Transfers Assistance Needed: Wife reports assist from caregivers for transfers to/from wheelchair with RW; requires assist up/down stairs in w/c. Wife drives occasionally ("we don't get out much anymore") ADL's / Homemaking Assistance Needed: Assist for ADLs; typically wears diapers; assist for meal set-up, able to feed self            OT Problem List: Decreased strength;Decreased activity tolerance;Impaired balance (sitting and/or standing);Decreased safety awareness;Decreased knowledge of use of DME or AE;Decreased cognition      OT Treatment/Interventions: Self-care/ADL training;Therapeutic exercise;DME and/or AE instruction;Therapeutic activities;Patient/family education;Balance training;Energy conservation    OT Goals(Current goals can be found in the care plan section) Acute  Rehab OT Goals Patient Stated Goal: return home OT Goal Formulation: With patient Time For Goal Achievement: 04/28/21 Potential to Achieve Goals:  Good ADL Goals Additional ADL Goal #1: Pt to demonstrate functional transfers during ADLs at [place] A to reduce caregiver burden Additional ADL Goal #2: Pt to increase standing tolerance > 3-5 min to maximize safety during ADLs with caregiver assist.  OT Frequency: Min 2X/week   Barriers to D/C:            Co-evaluation              AM-PAC OT "6 Clicks" Daily Activity     Outcome Measure Help from another person eating meals?: A Little Help from another person taking care of personal grooming?: A Little Help from another person toileting, which includes using toliet, bedpan, or urinal?: Total Help from another person bathing (including washing, rinsing, drying)?: A Lot Help from another person to put on and taking off regular upper body clothing?: A Lot Help from another person to put on and taking off regular lower body clothing?: A Lot 6 Click Score: 13   End of Session Equipment Utilized During Treatment: Gait belt;Rolling walker Nurse Communication: Mobility status;Other (comment) (notified NT/RN pt on bedpan)  Activity Tolerance: Patient tolerated treatment well;Patient limited by fatigue Patient left: in bed;with call bell/phone within reach;with bed alarm set;Other (comment) (on bedpan)  OT Visit Diagnosis: Muscle weakness (generalized) (M62.81)                Time: 0092-3300 OT Time Calculation (min): 23 min Charges:  OT General Charges $OT Visit: 1 Visit OT Evaluation $OT Eval Moderate Complexity: 1 Mod OT Treatments $Self Care/Home Management : 8-22 mins  Malachy Chamber, OTR/L Acute Rehab Services Office: [phone]  Layla Maw 04/14/2021, 2:24 PM

## 2021-04-14 NOTE — Progress Notes (Signed)
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  Patient also reports onset of orthopnea unable to sleep during the night.  Patient denied any chest pain and no cough.  No peripheral edema.  In the ED, temperature 98.2 F, HR 106, RR 22, BP 165/86, SPO2 99% on room air. Sodium 136, potassium 4.6, chloride 104, CO2 22, glucose 116, BUN 23, creatinine 1.58.  WBC 11.3, hemoglobin 14.1.  BNP 300.9.  Troponin 15>17.  COVID-19 PCR negative.  Influenza A/B PCR negative.  Chest x-ray with bibasilar segmental atelectasis with similar appearing hiatal hernia.  EKG with sinus tachycardia, no concerning dynamic changes.  EDP consulted [facility] for further evaluation and management of progressive dyspnea secondary to acute decompensated diastolic congestive heart failure exacerbation.   Assessment & Plan:   Active Problems:   Acute diastolic CHF (congestive heart failure) (HCC)   Acute on chronic heart failure (HCC)   Acute on chronic diastolic congestive heart failure Patient presenting to ED with progressive shortness of breath, wheezing and orthopnea.  Noted trace lower extremity edema.  Noted to have an elevated BNP of 300.9.  Cova-19/influenza A/B PCR negative.  Mildly elevated D-dimer 2.75, but unlikely PE given age adjustment.  Troponins within normal limits.  CT chest with no acute findings. --Furosemide 20 mg IV every 24 hours --net negative 441mL past 24h --wt 68.3>66.8kg --Strict I's and O's and daily weights --TTE: Pending --home O2 evaluation --BMP daily  Essential hypertension --Metoprolol tartrate 25 mg  p.o. twice daily --Continue aspirin and statin  CKD stage IV --Cr 1.58>1.79 (baseline 1.5-1.6) --BMP daily  Hypothyroidism: Levothyroxine 50 mcg p.o. daily  HLD: Atorvastatin 40 mg p.o. daily  GERD: Continue PPI  Weakness/debility/deconditioning: Per son's report, patient lives at home with spouse and caregivers to include RN, PT, and aide.  At baseline is able to utilize a walker with minimal mobilization; otherwise uses a wheelchair and stays mostly a day in the bed versus chair. --PT recommends home health versus SNF --OT: Pending --Continue therapy efforts while inpatient  DVT prophylaxis: Heparin   Code Status: DNR Family Communication: Updated patient's son via telephone this afternoon  Disposition Plan:  Level of care: Telemetry Medical Status is: Observation  The patient remains OBS appropriate and will d/c before 2 midnights.  Dispo: The patient is from: Home              Anticipated d/c is to: Home              Patient currently is not medically stable to d/c.   Difficult to place patient No   Consultants:   none  Procedures:   TTE: Pending  Antimicrobials:   None   Subjective: Patient seen examined bedside, resting comfortably.  Eating breakfast.  No specific complaints this morning.  Dyspnea improved.  No family present at bedside this morning.  Updated patient's son via telephone this afternoon.  Denies headache, no dizziness, no fever/chills/night sweats, no nausea/vomiting/diarrhea, no chest pain, no abdominal pain.  No acute events overnight per nurse staff.  Objective: Vitals:   04/14/21 0500 04/14/21 0549 04/14/21 0922 04/14/21 0935  BP:  118/88  106/74  Pulse:  88  88  Resp:  18  18  Temp:  98 F (36.7 C)    TempSrc:      SpO2:  91% 92%   Weight: 66.8 kg     Height:        Intake/Output Summary (Last 24 hours) at 04/14/2021 1249 Last data filed at 04/14/2021 1006 Gross per 24 hour  Intake 240 ml  Output 400 ml  Net -160 ml    Filed Weights   04/13/21 1457 04/13/21 1910 04/14/21 0500  Weight: 63.5 kg 68.3 kg 66.8 kg    Examination:  General exam: Appears calm and comfortable, elderly in appearance Respiratory system: Breath sounds slightly decreased bilateral bases, no wheezing/crackles, normal respiratory effort, on room air Cardiovascular system: S1 & S2 heard, RRR. No JVD, murmurs, rubs, gallops or clicks. No pedal edema. Gastrointestinal system: Abdomen is nondistended, soft and nontender. No organomegaly or masses felt. Normal bowel sounds heard. Central nervous system: Alert and oriented. No focal neurological deficits. Extremities: Symmetric 5 x 5 power. Skin: No rashes, lesions or ulcers Psychiatry: Judgement and insight appear normal. Mood & affect appropriate.     Data Reviewed: I have personally reviewed following labs and imaging studies  CBC: Recent Labs  Lab 04/13/21 1542  WBC 11.3*  NEUTROABS 8.1*  HGB 14.1  HCT 45.2  MCV 101.8*  PLT PLATELET CLUMPS NOTED ON SMEAR, UNABLE TO ESTIMATE   Basic Metabolic Panel: Recent Labs  Lab 04/13/21 1542 04/14/21 0234  NA 136 139  K 4.6 4.1  CL 104 103  CO2 22 27  GLUCOSE 116* 153*  BUN 23 24*  CREATININE 1.58* 1.79*  CALCIUM 8.7* 8.8*   GFR: Estimated Creatinine Clearance: 18.5 mL/min (A) (by C-G formula based on SCr of 1.79 mg/dL (H)). Liver Function Tests: No results for input(s): AST, ALT, ALKPHOS, BILITOT, PROT, ALBUMIN in the last 168 hours. No results for input(s): LIPASE, AMYLASE in the last 168 hours. No results for input(s): AMMONIA in the last 168 hours. Coagulation Profile: No results for input(s): INR, PROTIME in the last 168 hours. Cardiac Enzymes: No results for input(s): CKTOTAL, CKMB, CKMBINDEX, TROPONINI in the last 168 hours. BNP (last 3 results) No results for input(s): PROBNP in the last 8760 hours. HbA1C: No results for input(s): HGBA1C in the last 72 hours. CBG: No results for input(s): GLUCAP in the  last 168 hours. Lipid Profile: No results for input(s): CHOL, HDL, LDLCALC, TRIG, CHOLHDL, LDLDIRECT in the last 72 hours. Thyroid Function Tests: No results for input(s): TSH, T4TOTAL, FREET4, T3FREE, THYROIDAB in the last 72 hours. Anemia Panel: No results for input(s): VITAMINB12, FOLATE, FERRITIN, TIBC, IRON, RETICCTPCT in the last 72 hours. Sepsis Labs: No results for input(s): PROCALCITON, LATICACIDVEN in the last 168 hours.  Recent Results (from the past 240 hour(s))  Resp Panel by RT-PCR (Flu A&B, Covid) Nasopharyngeal Swab     Status: None   Collection Time: 04/13/21  4:20 PM   Specimen: Nasopharyngeal Swab; Nasopharyngeal(NP) swabs in vial transport medium  Result Value Ref Range Status   SARS Coronavirus 2 by RT PCR NEGATIVE NEGATIVE Final    Comment: (NOTE) SARS-CoV-2 target nucleic acids are NOT DETECTED.  The SARS-CoV-2 RNA is generally detectable in upper respiratory specimens during the acute phase of infection. The lowest concentration of SARS-CoV-2 viral copies this assay can detect is 138 copies/mL. A negative result does not preclude SARS-Cov-2 infection and should not be used as the sole basis for treatment or  other patient management decisions. A negative result may occur with  improper specimen collection/handling, submission of specimen other than nasopharyngeal swab, presence of viral mutation(s) within the areas targeted by this assay, and inadequate number of viral copies(<138 copies/mL). A negative result must be combined with clinical observations, patient history, and epidemiological information. The expected result is Negative.  Fact Sheet for Patients:  EntrepreneurPulse.com.au  Fact Sheet for Healthcare Providers:  IncredibleEmployment.be  This test is no t yet approved or cleared by the [place] FDA and  has been authorized for detection and/or diagnosis of SARS-CoV-2 by FDA under an Emergency Use  Authorization (EUA). This EUA will remain  in effect (meaning this test can be used) for the duration of the COVID-19 declaration under Section 564(b)(1) of the Act, 21 U.S.C.section 360bbb-3(b)(1), unless the authorization is terminated  or revoked sooner.       Influenza A by PCR NEGATIVE NEGATIVE Final   Influenza B by PCR NEGATIVE NEGATIVE Final    Comment: (NOTE) The Xpert Xpress SARS-CoV-2/FLU/RSV plus assay is intended as an aid in the diagnosis of influenza from Nasopharyngeal swab specimens and should not be used as a sole basis for treatment. Nasal washings and aspirates are unacceptable for Xpert Xpress SARS-CoV-2/FLU/RSV testing.  Fact Sheet for Patients: EntrepreneurPulse.com.au  Fact Sheet for Healthcare Providers: IncredibleEmployment.be  This test is not yet approved or cleared by the [place] FDA and has been authorized for detection and/or diagnosis of SARS-CoV-2 by FDA under an Emergency Use Authorization (EUA). This EUA will remain in effect (meaning this test can be used) for the duration of the COVID-19 declaration under Section 564(b)(1) of the Act, 21 U.S.C. section 360bbb-3(b)(1), unless the authorization is terminated or revoked.  Performed at [facility], [place] [address] [place], [place] [address]          Radiology Studies: DG Chest 2 View  Result Date: 04/13/2021 CLINICAL DATA:  Shortness of breath EXAM: CHEST - 2 VIEW COMPARISON:  05/16/2019 FINDINGS: The mediastinal contours are within normal limits. No cardiomegaly. Persistent retrocardiac opacity compatible with known hiatal hernia. Bibasilar subsegmental atelectasis. The lungs are otherwise clear bilaterally without evidence of focal consolidation, pleural effusion, or pneumothorax. Multilevel degenerative changes of the thoracic spine. No acute osseous abnormality. Diffuse osteopenia. IMPRESSION: 1. Bibasilar subsegmental atelectasis. 2.  Similar appearing hiatal hernia. Electronically Signed   By: Ruthann Cancer MD   On: 04/13/2021 16:16   CT CHEST WO CONTRAST  Result Date: 04/13/2021 CLINICAL DATA:  One 87 year old male with cardiomegaly followup study. Wheezing. EXAM: CT CHEST WITHOUT CONTRAST TECHNIQUE: Multidetector CT imaging of the chest was performed following the standard protocol without IV contrast. COMPARISON:  Chest CT dated 05/16/2019. FINDINGS: Evaluation of this exam is limited in the absence of intravenous contrast. Cardiovascular: Mild dilatation of the left heart chambers. The overall cardiac size is within normal limits. There is advanced 3 vessel coronary vascular calcification as well as calcification of the mitral annulus. There is mild atherosclerotic calcification of the thoracic aorta. No aneurysmal dilatation. The central pulmonary arteries are grossly unremarkable on this noncontrast CT. Mediastinum/Nodes: There is no hilar or mediastinal adenopathy. There is a large hiatal hernia containing the majority of the stomach. Small amount of fluid and debris within the esophagus likely represent reflux. No mediastinal fluid collection. Lungs/Pleura: There are bibasilar subsegmental atelectasis. Pneumonia is less likely. Clinical correlation is recommended. There is no pleural effusion or pneumothorax. The central airways are patent. Upper Abdomen: Gallstones.  Musculoskeletal: There is osteopenia with degenerative changes of the spine. No acute osseous pathology. IMPRESSION: 1. No acute intrathoracic pathology. 2. Large hiatal hernia containing the majority of the stomach. 3. Cholelithiasis. 4. Aortic Atherosclerosis (ICD10-I70.0). Electronically Signed   By: Anner Crete M.D.   On: 04/13/2021 20:15        Scheduled Meds: . aspirin EC  81 mg Oral Daily  . atorvastatin  40 mg Oral Daily  . budesonide (PULMICORT) nebulizer solution  0.25 mg Nebulization BID  . clopidogrel  75 mg Oral Daily  . furosemide  20  mg Intravenous Daily  . guaiFENesin  1,200 mg Oral BID  . heparin  5,000 Units Subcutaneous Q12H  . latanoprost  1 drop Both Eyes QHS  . levothyroxine  50 mcg Oral Q0600  . metoprolol tartrate  25 mg Oral BID  . multivitamin with minerals  1 tablet Oral Q breakfast  . pantoprazole  40 mg Oral Daily  . sodium chloride flush  3 mL Intravenous Q12H  . triamcinolone cream   Topical BID   Continuous Infusions: . sodium chloride       LOS: 0 days    Time spent: 39 minutes spent on chart review, discussion with nursing staff, consultants, updating family and interview/physical exam; more than 50% of that time was spent in counseling and/or coordination of care.    Lochlann Mastrangelo J [place] DO [facility] Available via Epic secure chat 7am-7pm After these hours, please refer to coverage provider listed on [email or web address] 04/14/2021, 12:49 PM

## 2021-04-14 NOTE — Care Management (Signed)
04-14-21 1714 Case Manager spoke with patient and wife. Wife states the patient is active with [facility] for RN and PT. Resumption orders and F2F needed prior to transition home. Patient has a cane, rolling walker and hospital bed. No further needs from Case Manager at this time. Graves-Bigelow, Ocie Cornfield, RN, BSN Case Manager

## 2021-04-14 NOTE — Care Management Obs Status (Signed)
[facility] NOTIFICATION   Patient Details  Name: Robert Rowe MRN: [id number] Date of Birth: October 26, 1918   Medicare Observation Status Notification Given:  Yes    Bethena Roys, RN 04/14/2021, 5:04 PM

## 2021-04-14 NOTE — Progress Notes (Signed)
OT Cancellation Note  Patient Details Name: Robert Rowe MRN: [id number] DOB: [birth year]   Cancelled Treatment:    Reason Eval/Treat Not Completed: Other (comment) Pt eating lunch on entry. Will follow-up after pt finishes meal for OT eval.  Layla Maw 04/14/2021, 12:24 PM

## 2021-04-14 NOTE — Progress Notes (Signed)
  Echocardiogram 2D Echocardiogram has been performed.  Robert Rowe 04/14/2021, 2:52 PM

## 2021-04-15 DIAGNOSIS — I5031 Acute diastolic (congestive) heart failure: Secondary | ICD-10-CM | POA: Diagnosis not present

## 2021-04-15 LAB — BASIC METABOLIC PANEL
Anion gap: 11 (ref 5–15)
BUN: 32 mg/dL — ABNORMAL HIGH (ref 8–23)
CO2: 25 mmol/L (ref 22–32)
Calcium: 8.8 mg/dL — ABNORMAL LOW (ref 8.9–10.3)
Chloride: 101 mmol/L (ref 98–111)
Creatinine, Ser: 1.92 mg/dL — ABNORMAL HIGH (ref 0.61–1.24)
GFR, Estimated: 31 mL/min — ABNORMAL LOW (ref >60)
Glucose, Bld: 144 mg/dL — ABNORMAL HIGH (ref 70–99)
Potassium: 3.6 mmol/L (ref 3.5–5.1)
Sodium: 137 mmol/L (ref 135–145)

## 2021-04-15 MED ORDER — FUROSEMIDE 20 MG PO TABS: 20.0000 mg | ORAL_TABLET | Freq: Every day | ORAL | 0 refills | Status: DC | PRN

## 2021-04-15 NOTE — Discharge Instructions (Signed)
Heart Failure, Self-Care Heart failure is a serious condition. The following information explains the things you need to do to take care of yourself after a heart failure diagnosis. You may be asked to change your diet, take certain medicines, and make other lifestyle changes in order to stay as healthy as possible. Your health care provider may also give you more specific instructions. If you have problems or questions, contact your health care provider. What are the risks? Having heart failure puts you at higher risk for certain problems. These problems can get worse if you do not take good care of yourself. Problems may include:  Damage to the kidneys, liver, or lungs.  Malnutrition.  Abnormal heart rhythms.  Blood clotting issues that could cause a stroke. Supplies needed:  Scale for monitoring weight.  Blood pressure monitor.  Notebook.  Medicines. How to care for yourself when you have heart failure Medicines Take over-the-counter and prescription medicines only as told by your health care provider. Medicines reduce the workload of your heart, slow the progression of heart failure, and improve symptoms. Take your medicines every day.  Do not stop taking your medicine unless your health care provider tells you to do so.  Do not skip any dose of medicine.  Refill your prescriptions before you run out of medicine.  Talk with your health care provider if you cannot afford your medicines. Eating and drinking  Eat heart-healthy foods. Talk with a dietitian to make an eating plan that is right for you. ? Limit salt (sodium) if told by your health care provider. Sodium restriction may reduce symptoms of heart failure. Ask a dietitian to recommend heart-healthy seasonings. ? Use healthy cooking methods instead of frying. Healthy methods include roasting, grilling, broiling, baking, poaching, steaming, and stir-frying. ? Choose foods that contain no trans fat and are low in saturated  fat and cholesterol. Healthy choices include fresh or frozen fruits and vegetables, fish, lean meats, legumes, fat-free or low-fat dairy products, and whole-grain or high-fiber foods.  Limit your fluid intake, if directed by your health care provider. Fluid restriction may reduce symptoms of heart failure.   Alcohol use  Do not drink alcohol if: ? Your health care provider tells you not to drink. ? Your heart was damaged by alcohol, or you have severe heart failure. ? You are pregnant, may be pregnant, or are planning to become pregnant.  If you drink alcohol: ? Limit how much you have to:  0-1 drink a day for women.  0-2 drinks a day for men. ? Know how much alcohol is in your drink. In the [place] one drink equals one 12 oz bottle of beer (355 mL), one 5 oz glass of wine (148 mL), or one 1 oz glass of hard liquor (44 mL). Lifestyle  Do not use any products that contain nicotine or tobacco. These products include cigarettes, chewing tobacco, and vaping devices, such as e-cigarettes. If you need help quitting, ask your health care provider. ? Do not use nicotine gum or patches before talking to your health care provider.  Do not use illegal drugs.  Work with your health care provider to safely reach the right body weight.  Do physical activity if told by your health care provider. Talk to your health care provider before you begin an exercise if: ? You are an older adult. ? You have severe heart failure.  Learn to manage stress. If you need help to do this, ask your health care provider.  Participate  in or seek physical rehabilitation as needed to keep or improve your independence and quality of life.  Participate in a cardiac rehabilitation program, which is a treatment program to improve your health and well-being through exercise training, education, and counseling.  Plan rest periods when you get tired.   Monitoring important information  Weigh yourself every day. This will  help you to notice if too much fluid is building up in your body. ? Weigh yourself every morning after you urinate and before you eat breakfast. ? Wear the same amount of clothing each time you weigh yourself. ? Record your daily weight. Provide your health care provider with your weight record.  Monitor and record your pulse and blood pressure as told by your health care provider.   Dealing with extreme temperatures  If the weather is extremely hot: ? Avoid vigorous physical activity. ? Use air conditioning or fans, or find a cooler location. ? Avoid caffeine and alcohol. ? Wear loose-fitting, lightweight, and light-colored clothing.  If the weather is extremely cold: ? Avoid vigorous activity. ? Layer your clothes. ? Wear mittens or gloves, a hat, and a face covering when you go outside. ? Avoid alcohol. Follow these instructions at home:  Stay up to date with vaccines. Pneumococcal and flu (influenza) vaccines are especially important in preventing infections of the airways.  Keep all follow-up visits. This is important. Contact a health care provider if you:  Gain 2-3 lb (1-1.4 kg) in 24 hours or 5 lb (2.3 kg) in a week.  Have increasing shortness of breath.  Are unable to participate in your usual physical activities.  Get tired easily.  Cough more than normal, especially with physical activity.  Lose your appetite or feel nauseous.  Have any swelling or more swelling in areas such as your hands, feet, ankles, or abdomen.  Are unable to sleep because it is hard to breathe.  Feel like your heart is beating quickly (palpitations).  Become dizzy or light-headed when you stand up.  Have feelings of depression or sadness. Get help right away if you:  Have trouble breathing.  Notice, or your family notices, a change in your awareness, such as having trouble staying awake or concentrating.  Have pain or discomfort in your chest.  Have an episode of fainting  (syncope). These symptoms may represent a serious problem that is an emergency. Do not wait to see if the symptoms will go away. Get medical help right away. Call your local emergency services (911 in the [place] Do not drive yourself to the hospital. Summary  Heart failure is a serious condition. To care for yourself, you may be asked to change your diet, take certain medicines, and make other lifestyle changes.  Take your medicines every day. Do not stop taking them unless your health care provider tells you to do so.  Limit salt and eat heart-healthy foods, such as fresh or frozen fruits and vegetables, fish, lean meats, legumes, fat-free or low-fat dairy products, and whole-grain or high-fiber foods.  Ask your health care provider if you have any alcohol restrictions. You may have to stop drinking alcohol if you have severe heart failure.  Contact your health care provider if you notice problems, such as rapid weight gain or a fast heartbeat. Get help right away if you faint or have chest pain or trouble breathing. This information is not intended to replace advice given to you by your health care provider. Make sure you discuss any questions you have  with your health care provider. Document Revised: 05/30/2020 Document Reviewed: 05/30/2020 Elsevier Patient Education  2021 [employer].  Daily Weight Record It is important to weigh yourself daily. To do this:  Make sure you use a reliable scale. Use the same scale each day.  Keep this daily weight chart near your scale.  Weigh yourself each morning at the same time.  Before weighing yourself: ? Take off your shoes. ? Make sure you are wearing the same amount of clothing each day.  Write down your weight in the spaces on the form.  Compare today's weight to yesterday's weight.  Bring this form with you to your follow-up visits with your health care provider. Call your health care provider if you have concerns about your weight,  including rapid weight gain or loss. Date: ________ Weight: ____________________ Date: ________ Weight: ____________________ Date: ________ Weight: ____________________ Date: ________ Weight: ____________________ Date: ________ Weight: ____________________ Date: ________ Weight: ____________________ Date: ________ Weight: ____________________ Date: ________ Weight: ____________________ Date: ________ Weight: ____________________ Date: ________ Weight: ____________________ Date: ________ Weight: ____________________ Date: ________ Weight: ____________________ Date: ________ Weight: ____________________ Date: ________ Weight: ____________________ Date: ________ Weight: ____________________ Date: ________ Weight: ____________________ Date: ________ Weight: ____________________ Date: ________ Weight: ____________________ Date: ________ Weight: ____________________ Date: ________ Weight: ____________________ Date: ________ Weight: ____________________ Date: ________ Weight: ____________________ Date: ________ Weight: ____________________ Date: ________ Weight: ____________________ Date: ________ Weight: ____________________ Date: ________ Weight: ____________________ Date: ________ Weight: ____________________ Date: ________ Weight: ____________________ Date: ________ Weight: ____________________ Date: ________ Weight: ____________________ Date: ________ Weight: ____________________ Date: ________ Weight: ____________________ Date: ________ Weight: ____________________ Date: ________ Weight: ____________________ Date: ________ Weight: ____________________ Date: ________ Weight: ____________________ Date: ________ Weight: ____________________ Date: ________ Weight: ____________________ Date: ________ Weight: ____________________ Date: ________ Weight: ____________________ Date: ________ Weight: ____________________ Date: ________ Weight: ____________________ Date: ________ Weight:  ____________________ Date: ________ Weight: ____________________ Date: ________ Weight: ____________________ Date: ________ Weight: ____________________ Date: ________ Weight: ____________________ Date: ________ Weight: ____________________ Date: ________ Weight: ____________________ Date: ________ Weight: ____________________ This information is not intended to replace advice given to you by your health care provider. Make sure you discuss any questions you have with your health care provider. Document Revised: 11/03/2017 Document Reviewed: 11/06/2017 Elsevier Patient Education  2021 [employer].

## 2021-04-15 NOTE — Progress Notes (Signed)
Physical Therapy Treatment Patient Details Name: KEMET NIJJAR MRN: [id number] DOB: 1918-05-20 Today's Date: 04/15/2021    History of Present Illness Pt is a 87 y.o. male admitted 04/13/21 with acute onset SOB and wheezing. Workup for acute on chronic diastolic CHF decompensation, bronchospasm. Chest CT negative for acute injury; large hiatal hernia containing majority of stomach. PMH includes blind L eye, CKD, TIA, CHF, DJD, OA, RA, hernia.   PT Comments    Pt preparing for d/c this afternoon. Today's session focused on transfer training and seated balance; pt requiring increased assist, maxA for standing and pivot transfers; pt reports limited by increased fatigue as well as pain. Remains pleasant and motivated to participate. If to remain admitted, will continue to follow acutely.    Follow Up Recommendations  Home health PT;Supervision for mobility/OOB     Equipment Recommendations  None recommended by PT    Recommendations for Other Services       Precautions / Restrictions Precautions Precautions: Fall;Other (comment) Precaution Comments: H/o R-side hemiparesis Restrictions Weight Bearing Restrictions: No    Mobility  Bed Mobility Overal bed mobility: Needs Assistance Bed Mobility: Supine to Sit     Supine to sit: Max assist;HOB elevated     General bed mobility comments: MaxA to assist trunk elevation and moving towards EOB; pt limited by c/o scrotal pain    Transfers Overall transfer level: Needs assistance Equipment used: 1 person hand held assist Transfers: Stand Pivot Transfers;Sit to/from Stand Sit to Stand: Max assist Stand pivot transfers: Max assist       General transfer comment: MaxA to perform stand pivot transfer to recliner wtih LUE support and bed pad for lifting hips; additional stand from recliner with maxA, pt limited by fatigue and c/o scrotal pain  Ambulation/Gait                 Stairs             Wheelchair Mobility     Modified Rankin (Stroke Patients Only)       Balance Overall balance assessment: Needs assistance   Sitting balance-Leahy Scale: Fair Sitting balance - Comments: Able to maintain static sitting without UE support     Standing balance-Leahy Scale: Poor Standing balance comment: Reliant on UE support and external assist.                            Cognition Arousal/Alertness: Awake/alert Behavior During Therapy: WFL for tasks assessed/performed Overall Cognitive Status: History of cognitive impairments - at baseline Area of Impairment: Attention;Memory;Following commands;Safety/judgement;Awareness;Problem solving                   Current Attention Level: Sustained;Selective Memory: Decreased short-term memory Following Commands: Follows one step commands with increased time;Follows one step commands inconsistently Safety/Judgement: Decreased awareness of deficits Awareness: Emergent Problem Solving: Slow processing;Decreased initiation;Difficulty sequencing;Requires verbal cues General Comments: Suspect likely some baseline cognitive impairment; difficult to determine extend versus exacerbated by [facility]. Does not recall working with therapy yesterday, repetitive with info provided      Exercises      General Comments General comments (skin integrity, edema, etc.): Pt's wife present and supportive. Reports caregiver will not be at their home today after pt DC and inquiring about what clothing should be worn for ambulance transport. Wife prefers gown for easier mgmt at home this evening.      Pertinent Vitals/Pain Pain Assessment: Faces Faces Pain Scale: Hurts a little  bit Pain Location: Scrotum Pain Descriptors / Indicators: Discomfort Pain Intervention(s): Repositioned    Home Living                      Prior Function            PT Goals (current goals can now be found in the care plan section) Acute Rehab PT Goals Patient Stated Goal:  return home Progress towards PT goals: Progressing toward goals (slowly)    Frequency    Min 3X/week      PT Plan Current plan remains appropriate    Co-evaluation              AM-PAC PT "6 Clicks" Mobility   Outcome Measure  Help needed turning from your back to your side while in a flat bed without using bedrails?: A Lot Help needed moving from lying on your back to sitting on the side of a flat bed without using bedrails?: A Lot Help needed moving to and from a bed to a chair (including a wheelchair)?: A Lot Help needed standing up from a chair using your arms (e.g., wheelchair or bedside chair)?: A Lot Help needed to walk in hospital room?: Total Help needed climbing 3-5 steps with a railing? : Total 6 Click Score: 10    End of Session   Activity Tolerance: Patient tolerated treatment well;Patient limited by fatigue Patient left: in chair;with call bell/phone within reach;with chair alarm set Nurse Communication: Mobility status PT Visit Diagnosis: Other abnormalities of gait and mobility (R26.89);Muscle weakness (generalized) (M62.81)     Time: 7262-0355 PT Time Calculation (min) (ACUTE ONLY): 22 min  Charges:  $Therapeutic Activity: 8-22 mins                    Mabeline Caras, PT, DPT Acute Rehabilitation Services  Pager [phone] Office [place] 04/15/2021, 12:05 PM

## 2021-04-15 NOTE — Plan of Care (Signed)

## 2021-04-15 NOTE — Discharge Summary (Signed)
Physician Discharge Summary  Robert Rowe:[id number] DOB: September 03, 1918 DOA: 04/13/2021  PCP: [name], MD  Admit date: 04/13/2021 Discharge date: 04/15/2021  Admitted From: Home Disposition: Home  Recommendations for Outpatient Follow-up:  1. Follow up with PCP, scheduled on 04/23/2021 2. Please obtain BMP in one week to assess renal function 3. Discharged on furosemide 20 mg p.o. 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  Patient also reports onset of orthopnea unable to sleep during the night.  Patient denied any chest pain and no cough.  No peripheral edema.  In the ED, temperature 98.2 F, HR 106, RR 22, BP 165/86, SPO2 99% on room air. Sodium 136, potassium 4.6, chloride 104, CO2 22, glucose 116, BUN 23, creatinine 1.58.  WBC 11.3, hemoglobin 14.1.  BNP 300.9.  Troponin 15>17.  COVID-19 PCR negative.  Influenza A/B PCR negative.  Chest x-ray with bibasilar segmental atelectasis with similar appearing hiatal hernia.  EKG with sinus tachycardia, no concerning dynamic changes.  EDP consulted [facility] for further evaluation and management of progressive dyspnea secondary to acute decompensated diastolic congestive heart failure exacerbation.  Hospital course:  Acute on chronic diastolic congestive heart failure Patient presenting to ED with progressive shortness of breath, wheezing and orthopnea. Noted trace lower extremity edema.  Noted to have an elevated BNP of  300.9. Covid-19/influenza A/B PCR negative.  Mildly elevated D-dimer 2.75, but unlikely PE given age adjustment.  Troponins within normal limits.  CT chest with no acute findings.  Patient was started on IV diuresis with good urine output and resolution of his dyspnea.  TTE with LVEF 33-82%, grade 2 diastolic dysfunction, LA mildly dilated, IVC normal in size, RV function mildly reduced.  Weight at time of discharge 66.4 kg.  Continue furosemide 20 mg p.o. daily as needed for shortness of breath, weight gain greater than 3 pounds in 1 day and 5 pounds in 1 week or lower extremity edema.  Continue home metoprolol succinate 25 mg p.o. daily.  Outpatient follow-up with PCP 1 week.  Recommend BMP at visit.  Essential hypertension Metoprolol succinate 25 mg p.o. daily.  Continue statin.  Not on aspirin at home.  CKD stage IV Baseline creatinine 1.5-1.6, creatinine 1.9 at time of discharge following IV diuresis.  Recommend repeat BMP at next PCP visit.  Hypothyroidism: Levothyroxine 50 mcg p.o. daily  HLD: Atorvastatin 40 mg p.o. daily  GERD: Continue PPI  Weakness/debility/deconditioning: Per son's report, patient lives at home with spouse and caregivers to include RN, PT, and aide.  At baseline is able to utilize a walker with minimal mobilization; otherwise uses a wheelchair and stays mostly a day in the bed versus chair.  Orders for resumption of home health PT/RN placed at time of discharge.  Discharge Diagnoses:  Active Problems:   Dyslipidemia   GERD   Hypothyroidism   Mild cognitive impairment   Gait disturbance, post-stroke    Discharge Instructions  Discharge Instructions    Call MD for:  difficulty breathing, headache or visual disturbances   Complete by: As directed  Call MD for:  extreme fatigue   Complete by: As directed    Call MD for:  persistant dizziness or light-headedness   Complete by: As directed    Call MD for:  persistant nausea and vomiting   Complete  by: As directed    Call MD for:  severe uncontrolled pain   Complete by: As directed    Call MD for:  temperature >100.4   Complete by: As directed    Diet - low sodium heart healthy   Complete by: As directed    Increase activity slowly   Complete by: As directed      Allergies as of 04/15/2021      Reactions   Codeine Other (See Comments)   Patient was "all over the place"- undesired side effect   Sulfasalazine Other (See Comments)   Reaction not recalled   Sulfonamide Derivatives Other (See Comments)   Reaction not recalled      Medication List    TAKE these medications   acetaminophen 325 MG tablet Commonly known as: TYLENOL Take 650 mg by mouth at bedtime.   albuterol 108 (90 Base) MCG/ACT inhaler Commonly known as: ProAir HFA INHALE 2 PUFFS INTO THE LUNGS EVERY 6 (SIX) HOURS AS NEEDED FOR WHEEZING (COUGH). What changed:   how much to take  how to take this  when to take this  reasons to take this  additional instructions   atorvastatin 40 MG tablet Commonly known as: LIPITOR TAKE 1 TABLET (40 MG TOTAL) BY MOUTH DAILY AT 6 PM. What changed: additional instructions   clopidogrel 75 MG tablet Commonly known as: PLAVIX TAKE 1 TABLET BY MOUTH  DAILY   furosemide 20 MG tablet Commonly known as: Lasix Take 1 tablet (20 mg total) by mouth daily as needed for fluid or edema (for increased weight of 3 pounds in one day or 5 pounds in one week).   ketoconazole 2 % cream Commonly known as: NIZORAL APPLY 1 APPLICATION TOPICALLY 2 (TWO) TIMES DAILY AS NEEDED FOR IRRITATION.   latanoprost 0.005 % ophthalmic solution Commonly known as: XALATAN Place 1 drop into both eyes at bedtime. For Glaucoma   levothyroxine 50 MCG tablet Commonly known as: SYNTHROID TAKE 1 TABLET BY MOUTH  DAILY What changed: when to take this   magnesium hydroxide 400 MG/5ML suspension Commonly known as: MILK OF MAGNESIA Take 30 mLs by mouth See admin instructions. Constipation (1 of  4 ) If no BM in 3 days, give 30 cc Milk of Magnesium p.o. x 1 dose in 24 hours as needed ( Do  Not use standing constipation orders for residents with renal failure CFR less than 30. Contact MD for orders)   meclizine 12.5 MG tablet Commonly known as: ANTIVERT Take 1 tablet (12.5 mg total) by mouth 3 (three) times daily as needed for dizziness.   metoprolol succinate 25 MG 24 hr tablet Commonly known as: TOPROL-XL TAKE 1 TABLET BY MOUTH AT  BEDTIME   nitroGLYCERIN 0.4 MG SL tablet Commonly known as: NITROSTAT Place 1 tablet (0.4 mg total) under the tongue every 5 (five) minutes as needed (for Esophageal spasms).   pantoprazole 40 MG tablet Commonly known as: PROTONIX TAKE 1 TABLET BY MOUTH EVERY DAY   senna-docusate 8.6-50 MG tablet Commonly known as: Senokot-S Take 1 tablet by mouth at bedtime as needed for moderate constipation.   triamcinolone cream 0.1 % Commonly known as: KENALOG APPLY TO AFFECTED RASH TWICE DAILY AS NEEDED. What changed: See the new  instructions.       Follow-up Information    [facility] Follow up.   Specialty: Home Health Services Why: Registered Nurse and Physical Therapy-office to call with visit times.  Contact information: [place] [address] [phone]        [name], MD. Go on 04/23/2021.   Specialty: Family Medicine Why: @2 :45pm Contact information: [place] [address] [phone]              Allergies  Allergen Reactions  . Codeine Other (See Comments)    Patient was "all over the place"- undesired side effect  . Sulfasalazine Other (See Comments)    Reaction not recalled  . Sulfonamide Derivatives Other (See Comments)    Reaction not recalled    Consultations:  None   Procedures/Studies: DG Chest 2 View  Result Date: 04/13/2021 CLINICAL DATA:  Shortness of breath EXAM: CHEST - 2 VIEW COMPARISON:  05/16/2019 FINDINGS: The mediastinal contours  are within normal limits. No cardiomegaly. Persistent retrocardiac opacity compatible with known hiatal hernia. Bibasilar subsegmental atelectasis. The lungs are otherwise clear bilaterally without evidence of focal consolidation, pleural effusion, or pneumothorax. Multilevel degenerative changes of the thoracic spine. No acute osseous abnormality. Diffuse osteopenia. IMPRESSION: 1. Bibasilar subsegmental atelectasis. 2. Similar appearing hiatal hernia. Electronically Signed   By: Ruthann Cancer MD   On: 04/13/2021 16:16   CT CHEST WO CONTRAST  Result Date: 04/13/2021 CLINICAL DATA:  One 87 year old male with cardiomegaly followup study. Wheezing. EXAM: CT CHEST WITHOUT CONTRAST TECHNIQUE: Multidetector CT imaging of the chest was performed following the standard protocol without IV contrast. COMPARISON:  Chest CT dated 05/16/2019. FINDINGS: Evaluation of this exam is limited in the absence of intravenous contrast. Cardiovascular: Mild dilatation of the left heart chambers. The overall cardiac size is within normal limits. There is advanced 3 vessel coronary vascular calcification as well as calcification of the mitral annulus. There is mild atherosclerotic calcification of the thoracic aorta. No aneurysmal dilatation. The central pulmonary arteries are grossly unremarkable on this noncontrast CT. Mediastinum/Nodes: There is no hilar or mediastinal adenopathy. There is a large hiatal hernia containing the majority of the stomach. Small amount of fluid and debris within the esophagus likely represent reflux. No mediastinal fluid collection. Lungs/Pleura: There are bibasilar subsegmental atelectasis. Pneumonia is less likely. Clinical correlation is recommended. There is no pleural effusion or pneumothorax. The central airways are patent. Upper Abdomen: Gallstones. Musculoskeletal: There is osteopenia with degenerative changes of the spine. No acute osseous pathology. IMPRESSION: 1. No acute intrathoracic  pathology. 2. Large hiatal hernia containing the majority of the stomach. 3. Cholelithiasis. 4. Aortic Atherosclerosis (ICD10-I70.0). Electronically Signed   By: Anner Crete M.D.   On: 04/13/2021 20:15   ECHOCARDIOGRAM COMPLETE  Result Date: 04/14/2021    ECHOCARDIOGRAM REPORT   Patient Name:   [name] Date of Exam: 04/14/2021 Medical Rec #:  [id number]         Height:       62.0 in Accession #:    [id number]        Weight:       147.3 lb Date of Birth:  10/06/1918        BSA:          1.679 m Patient Age:    87 years         BP:           106/74 mmHg Patient Gender:  M                 HR:           88 bpm. Exam Location:  Inpatient Procedure: 2D Echo, Cardiac Doppler, Color Doppler and Intracardiac            Opacification Agent Indications:    CHF  History:        Patient has prior history of Echocardiogram examinations, most                 recent 05/13/2019. CHF, TIA, Signs/Symptoms:Shortness of Breath                 and Altered Mental Status; Risk Factors:Hypertension and                 Dyslipidemia. CKD.  Sonographer:    Dustin Flock Referring Phys: [id number] PING T [place]  Sonographer Comments: Technically difficult study due to poor echo windows and suboptimal parasternal window. Image acquisition challenging due to respiratory motion. IMPRESSIONS  1. Left ventricular ejection fraction, by estimation, is 65 to 70%. The left ventricle has normal function. The left ventricle has no regional wall motion abnormalities. Left ventricular diastolic parameters are consistent with Grade II diastolic dysfunction (pseudonormalization). Elevated left atrial pressure.  2. Right ventricular systolic function is mildly reduced. The right ventricular size is normal. Tricuspid regurgitation signal is inadequate for assessing PA pressure.  3. Left atrial size was mildly dilated.  4. The mitral valve was not well visualized. No evidence of mitral valve regurgitation. No evidence of mitral stenosis.  Moderate mitral annular calcification.  5. The aortic valve was not well visualized. Aortic valve regurgitation is not visualized. No aortic stenosis is present.  6. The inferior vena cava is normal in size with greater than 50% respiratory variability, suggesting right atrial pressure of 3 mmHg. FINDINGS  Left Ventricle: Left ventricular ejection fraction, by estimation, is 65 to 70%. The left ventricle has normal function. The left ventricle has no regional wall motion abnormalities. Definity contrast agent was given IV to delineate the left ventricular  endocardial borders. The left ventricular internal cavity size was normal in size. There is no left ventricular hypertrophy. Left ventricular diastolic parameters are consistent with Grade II diastolic dysfunction (pseudonormalization). Elevated left atrial pressure. Right Ventricle: The right ventricular size is normal. No increase in right ventricular wall thickness. Right ventricular systolic function is mildly reduced. Tricuspid regurgitation signal is inadequate for assessing PA pressure. Left Atrium: Left atrial size was mildly dilated. Right Atrium: Right atrial size was normal in size. Pericardium: There is no evidence of pericardial effusion. Mitral Valve: The mitral valve was not well visualized. Moderate mitral annular calcification. No evidence of mitral valve regurgitation. No evidence of mitral valve stenosis. Tricuspid Valve: The tricuspid valve is normal in structure. Tricuspid valve regurgitation is trivial. No evidence of tricuspid stenosis. Aortic Valve: The aortic valve was not well visualized. Aortic valve regurgitation is not visualized. No aortic stenosis is present. Pulmonic Valve: The pulmonic valve was not well visualized. No evidence of pulmonic stenosis. Aorta: The aortic root is normal in size and structure. Venous: The inferior vena cava is normal in size with greater than 50% respiratory variability, suggesting right atrial pressure  of 3 mmHg. IAS/Shunts: No atrial level shunt detected by color flow Doppler.   Diastology LV e' medial:    3.81 cm/s LV E/e' medial:  32.3 LV e' lateral:   5.33 cm/s LV E/e' lateral: 23.1  RIGHT VENTRICLE RV Basal diam:  4.20 cm RV S prime:     8.38 cm/s TAPSE (M-mode): 2.5 cm LEFT ATRIUM           Index       RIGHT ATRIUM           Index LA Vol (A4C): 51.0 ml 30.38 ml/m RA Area:     22.10 cm                                   RA Volume:   77.40 ml  46.11 ml/m  AORTIC VALVE LVOT Vmax:   87.50 cm/s LVOT Vmean:  56.000 cm/s LVOT VTI:    0.162 m MITRAL VALVE MV Area (PHT): 3.31 cm     SHUNTS MV Decel Time: 229 msec     Systemic VTI: 0.16 m MV E velocity: 123.00 cm/s MV A velocity: 132.00 cm/s MV E/A ratio:  0.93 Mihai Croitoru MD Electronically signed by Sanda Klein MD Signature Date/Time: 04/14/2021/4:04:51 PM    Final       Subjective: Patient seen and examined at bedside, resting comfortably.  No family present this morning.  Eating breakfast.  States ready for discharge home.  Dyspnea has now resolved.  No other complaints or concerns at this time.  Updated patient's spouse via telephone this morning.  Denies headache, no visual changes, no chest pain, no palpitations, no shortness of breath, no abdominal pain, no fevers, no nausea/vomiting/diarrhea.  No acute events overnight per nursing staff.  Discharge Exam: Vitals:   04/15/21 0809 04/15/21 0828  BP: 118/73   Pulse: 88   Resp:    Temp: (!) 97.5 F (36.4 C)   SpO2: 93% 94%   Vitals:   04/15/21 0119 04/15/21 0400 04/15/21 0809 04/15/21 0828  BP:  102/82 118/73   Pulse:  80 88   Resp:      Temp:   (!) 97.5 F (36.4 C)   TempSrc:   Oral   SpO2:  94% 93% 94%  Weight: 66.4 kg     Height:        General: Pt is alert, awake, not in acute distress, elderly in appearance Cardiovascular: RRR, S1/S2 +, no rubs, no gallops Respiratory: CTA bilaterally, no wheezing, no rhonchi, on room air Abdominal: Soft, NT, ND, bowel sounds  + Extremities: no edema, no cyanosis    The results of significant diagnostics from this hospitalization (including imaging, microbiology, ancillary and laboratory) are listed below for reference.     Microbiology: Recent Results (from the past 240 hour(s))  Resp Panel by RT-PCR (Flu A&B, Covid) Nasopharyngeal Swab     Status: None   Collection Time: 04/13/21  4:20 PM   Specimen: Nasopharyngeal Swab; Nasopharyngeal(NP) swabs in vial transport medium  Result Value Ref Range Status   SARS Coronavirus 2 by RT PCR NEGATIVE NEGATIVE Final    Comment: (NOTE) SARS-CoV-2 target nucleic acids are NOT DETECTED.  The SARS-CoV-2 RNA is generally detectable in upper respiratory specimens during the acute phase of infection. The lowest concentration of SARS-CoV-2 viral copies this assay can detect is 138 copies/mL. A negative result does not preclude SARS-Cov-2 infection and should not be used as the sole basis for treatment or other patient management decisions. A negative result may occur with  improper specimen collection/handling, submission of specimen other than nasopharyngeal swab, presence of viral mutation(s) within the areas targeted by this assay, and inadequate  number of viral copies(<138 copies/mL). A negative result must be combined with clinical observations, patient history, and epidemiological information. The expected result is Negative.  Fact Sheet for Patients:  EntrepreneurPulse.com.au  Fact Sheet for Healthcare Providers:  IncredibleEmployment.be  This test is no t yet approved or cleared by the [place] FDA and  has been authorized for detection and/or diagnosis of SARS-CoV-2 by FDA under an Emergency Use Authorization (EUA). This EUA will remain  in effect (meaning this test can be used) for the duration of the COVID-19 declaration under Section 564(b)(1) of the Act, 21 U.S.C.section 360bbb-3(b)(1), unless the authorization  is terminated  or revoked sooner.       Influenza A by PCR NEGATIVE NEGATIVE Final   Influenza B by PCR NEGATIVE NEGATIVE Final    Comment: (NOTE) The Xpert Xpress SARS-CoV-2/FLU/RSV plus assay is intended as an aid in the diagnosis of influenza from Nasopharyngeal swab specimens and should not be used as a sole basis for treatment. Nasal washings and aspirates are unacceptable for Xpert Xpress SARS-CoV-2/FLU/RSV testing.  Fact Sheet for Patients: EntrepreneurPulse.com.au  Fact Sheet for Healthcare Providers: IncredibleEmployment.be  This test is not yet approved or cleared by the [place] FDA and has been authorized for detection and/or diagnosis of SARS-CoV-2 by FDA under an Emergency Use Authorization (EUA). This EUA will remain in effect (meaning this test can be used) for the duration of the COVID-19 declaration under Section 564(b)(1) of the Act, 21 U.S.C. section 360bbb-3(b)(1), unless the authorization is terminated or revoked.  Performed at [facility], [place] [address], [place], [place] [address]      Labs: BNP (last 3 results) Recent Labs    04/13/21 1542  BNP 440.1*   Basic Metabolic Panel: Recent Labs  Lab 04/13/21 1542 04/14/21 0234 04/15/21 0256  NA 136 139 137  K 4.6 4.1 3.6  CL 104 103 101  CO2 22 27 25   GLUCOSE 116* 153* 144*  BUN 23 24* 32*  CREATININE 1.58* 1.79* 1.92*  CALCIUM 8.7* 8.8* 8.8*   Liver Function Tests: No results for input(s): AST, ALT, ALKPHOS, BILITOT, PROT, ALBUMIN in the last 168 hours. No results for input(s): LIPASE, AMYLASE in the last 168 hours. No results for input(s): AMMONIA in the last 168 hours. CBC: Recent Labs  Lab 04/13/21 1542  WBC 11.3*  NEUTROABS 8.1*  HGB 14.1  HCT 45.2  MCV 101.8*  PLT PLATELET CLUMPS NOTED ON SMEAR, UNABLE TO ESTIMATE   Cardiac Enzymes: No results for input(s): CKTOTAL, CKMB, CKMBINDEX, TROPONINI in the last 168  hours. BNP: Invalid input(s): POCBNP CBG: No results for input(s): GLUCAP in the last 168 hours. D-Dimer Recent Labs    04/13/21 2231  DDIMER 2.75*   Hgb A1c No results for input(s): HGBA1C in the last 72 hours. Lipid Profile No results for input(s): CHOL, HDL, LDLCALC, TRIG, CHOLHDL, LDLDIRECT in the last 72 hours. Thyroid function studies No results for input(s): TSH, T4TOTAL, T3FREE, THYROIDAB in the last 72 hours.  Invalid input(s): FREET3 Anemia work up No results for input(s): VITAMINB12, FOLATE, FERRITIN, TIBC, IRON, RETICCTPCT in the last 72 hours. Urinalysis    Component Value Date/Time   COLORURINE YELLOW 01/13/2019 0027   APPEARANCEUR CLEAR 01/13/2019 0027   LABSPEC 1.025 01/13/2019 0027   PHURINE 6.0 01/13/2019 0027   GLUCOSEU NEGATIVE 01/13/2019 0027   HGBUR NEGATIVE 01/13/2019 0027   BILIRUBINUR NEGATIVE 01/13/2019 [place] 01/13/2019 0027   PROTEINUR NEGATIVE 01/13/2019 0027  UROBILINOGEN 0.2 08/27/2015 1930   NITRITE NEGATIVE 01/13/2019 0027   LEUKOCYTESUR NEGATIVE 01/13/2019 0027   Sepsis Labs Invalid input(s): PROCALCITONIN,  WBC,  LACTICIDVEN Microbiology Recent Results (from the past 240 hour(s))  Resp Panel by RT-PCR (Flu A&B, Covid) Nasopharyngeal Swab     Status: None   Collection Time: 04/13/21  4:20 PM   Specimen: Nasopharyngeal Swab; Nasopharyngeal(NP) swabs in vial transport medium  Result Value Ref Range Status   SARS Coronavirus 2 by RT PCR NEGATIVE NEGATIVE Final    Comment: (NOTE) SARS-CoV-2 target nucleic acids are NOT DETECTED.  The SARS-CoV-2 RNA is generally detectable in upper respiratory specimens during the acute phase of infection. The lowest concentration of SARS-CoV-2 viral copies this assay can detect is 138 copies/mL. A negative result does not preclude SARS-Cov-2 infection and should not be used as the sole basis for treatment or other patient management decisions. A negative result may occur with   improper specimen collection/handling, submission of specimen other than nasopharyngeal swab, presence of viral mutation(s) within the areas targeted by this assay, and inadequate number of viral copies(<138 copies/mL). A negative result must be combined with clinical observations, patient history, and epidemiological information. The expected result is Negative.  Fact Sheet for Patients:  EntrepreneurPulse.com.au  Fact Sheet for Healthcare Providers:  IncredibleEmployment.be  This test is no t yet approved or cleared by the [place] FDA and  has been authorized for detection and/or diagnosis of SARS-CoV-2 by FDA under an Emergency Use Authorization (EUA). This EUA will remain  in effect (meaning this test can be used) for the duration of the COVID-19 declaration under Section 564(b)(1) of the Act, 21 U.S.C.section 360bbb-3(b)(1), unless the authorization is terminated  or revoked sooner.       Influenza A by PCR NEGATIVE NEGATIVE Final   Influenza B by PCR NEGATIVE NEGATIVE Final    Comment: (NOTE) The Xpert Xpress SARS-CoV-2/FLU/RSV plus assay is intended as an aid in the diagnosis of influenza from Nasopharyngeal swab specimens and should not be used as a sole basis for treatment. Nasal washings and aspirates are unacceptable for Xpert Xpress SARS-CoV-2/FLU/RSV testing.  Fact Sheet for Patients: EntrepreneurPulse.com.au  Fact Sheet for Healthcare Providers: IncredibleEmployment.be  This test is not yet approved or cleared by the [place] FDA and has been authorized for detection and/or diagnosis of SARS-CoV-2 by FDA under an Emergency Use Authorization (EUA). This EUA will remain in effect (meaning this test can be used) for the duration of the COVID-19 declaration under Section 564(b)(1) of the Act, 21 U.S.C. section 360bbb-3(b)(1), unless the authorization is terminated  or revoked.  Performed at [facility], [place] [address], [place], [place] [address]      Time coordinating discharge: Over 30 minutes  SIGNED:   Maelee Hoot J [place] DO  [facility] 04/15/2021, 10:12 AM

## 2021-04-15 NOTE — Progress Notes (Signed)
Heart Failure Navigator Progress Note  Assessed for Heart & Vascular TOC clinic readiness.  Unfortunately at this time the patient does not meet criteria due to advanced CKD IV (SCr up to 1.9 today) and LVEF 65-70%. CKD limits optimization of HFpEF medications at this time.    Navigator available for reassessment of patient.   Kerby Nora, PharmD, BCPS Heart Failure Stewardship Pharmacist Phone [phone]

## 2021-04-15 NOTE — Care Management (Signed)
1236 04-15-21 Case Manager received notification that the patient will need [facility] transportation home. Case Manager called [facility] at 1236, ETA between 1400-1500 per [facility] representative. Case Manager will continue to follow. Graves-Bigelow, Ocie Cornfield, RN, BSN Case Manager

## 2021-04-15 NOTE — Progress Notes (Signed)
D/C instructions given and reviewed. Tele and IV removed, tolerated fair. Awaiting [facility] for transport.

## 2021-04-15 NOTE — Progress Notes (Signed)
Occupational Therapy Treatment Patient Details Name: Robert Rowe MRN: [id number] DOB: January 23, 1918 Today's Date: 04/15/2021    History of present illness Pt is a 87 y.o. male admitted 04/13/21 with acute onset SOB and wheezing. Workup for acute on chronic diastolic CHF decompensation, bronchospasm. Chest CT negative for acute injury; large hiatal hernia containing majority of stomach. PMH includes blind L eye, CKD, TIA, CHF, DJD, OA, RA, hernia.   OT comments  Pt progressing gradually towards OT goals. Focused session on standing endurance to maximize safety with pivot transfers at home with pt fatiguing quickly and stood < 40 seconds with each trial. Per pt/wife request, pt wanted to use electric shaver seated at sink - requiring Mod A due to fatigue with seated tasks and difficulty managing R side. Discussed LB ADL routine with wife reporting brief changes and LB dressing is performed in bed at home. Wife reports confidence that caregivers can assist pt in the physical manner required at home.    Follow Up Recommendations  Home health OT;Supervision/Assistance - 24 hour (consideration of SNF if family/caregivers unable to provide the assist pt requires)    Equipment Recommendations  None recommended by OT (well equipped)    Recommendations for Other Services      Precautions / Restrictions Precautions Precautions: Fall;Other (comment) Precaution Comments: H/o R-side hemiparesis Restrictions Weight Bearing Restrictions: No       Mobility Bed Mobility               General bed mobility comments: received in chair    Transfers Overall transfer level: Needs assistance Equipment used: Rolling walker (2 wheeled) Transfers: Sit to/from Stand Sit to Stand: Max assist         General transfer comment: Max A for power up and to achieve upright positioning. Unable to stand longer than 40 seconds, fatigues quickly    Balance Overall balance assessment: Needs assistance    Sitting balance-Leahy Scale: Fair Sitting balance - Comments: Able to maintain static sitting without UE support     Standing balance-Leahy Scale: Poor Standing balance comment: Reliant on UE support and external assist.                           ADL either performed or assessed with clinical judgement   ADL Overall ADL's : Needs assistance/impaired     Grooming: Minimal assistance;Sitting;Wash/dry face Grooming Details (indicate cue type and reason): Able to wash face sitting in chair (attempted in standing but unable to due to poor endurance/balance). Mod A for shaving via electric razor due to fatigue and decreased ability to use R UE and/or reach R side of face                     Toileting- Clothing Manipulation and Hygiene: Total assistance;Sit to/from stand Toileting - Clothing Manipulation Details (indicate cue type and reason): Total A for peri care due to smear noted on pad, heavy assist needed to maintain balance during task       General ADL Comments: Pt with significant standing balance deficits, as well as strength and endurance requiring extensive assist for transfers/LB ADLs. Fatigues quickly with standing and prolonged sitting tasks     Vision   Vision Assessment?: No apparent visual deficits   Perception     Praxis      Cognition Arousal/Alertness: Awake/alert Behavior During Therapy: WFL for tasks assessed/performed Overall Cognitive Status: History of cognitive impairments - at baseline Area  of Impairment: Attention;Memory;Following commands;Safety/judgement;Awareness;Problem solving                   Current Attention Level: Sustained;Selective Memory: Decreased short-term memory Following Commands: Follows one step commands with increased time;Follows one step commands inconsistently Safety/Judgement: Decreased awareness of deficits Awareness: Emergent Problem Solving: Slow processing;Decreased initiation;Difficulty  sequencing;Requires verbal cues General Comments: Suspect likely some baseline cognitive impairment; difficult to determine extend versus exacerbated by [facility]. Pt inconsistent historian regarding PLOF        Exercises     Shoulder Instructions       General Comments Pt's wife present and supportive. Reports caregiver will not be at their home today after pt DC and inquiring about what clothing should be worn for ambulance transport. Wife prefers gown for easier mgmt at home this evening.    Pertinent Vitals/ Pain       Pain Assessment: Faces Faces Pain Scale: No hurt  Home Living                                          Prior Functioning/Environment              Frequency  Min 2X/week        Progress Toward Goals  OT Goals(current goals can now be found in the care plan section)  Progress towards OT goals: Progressing toward goals  Acute Rehab OT Goals Patient Stated Goal: return home OT Goal Formulation: With patient Time For Goal Achievement: 04/28/21 Potential to Achieve Goals: Good ADL Goals Additional ADL Goal #1: Pt to demonstrate functional transfers during ADLs at [facility] to reduce caregiver burden Additional ADL Goal #2: Pt to increase standing tolerance > 3-5 min to maximize safety during ADLs with caregiver assist.  Plan Discharge plan needs to be updated    Co-evaluation                 AM-PAC OT "6 Clicks" Daily Activity     Outcome Measure   Help from another person eating meals?: A Little Help from another person taking care of personal grooming?: A Little Help from another person toileting, which includes using toliet, bedpan, or urinal?: Total Help from another person bathing (including washing, rinsing, drying)?: A Lot Help from another person to put on and taking off regular upper body clothing?: A Lot Help from another person to put on and taking off regular lower body clothing?: A Lot 6 Click Score: 13    End of  Session Equipment Utilized During Treatment: Gait belt;Rolling walker  OT Visit Diagnosis: Muscle weakness (generalized) (M62.81)   Activity Tolerance Patient limited by fatigue   Patient Left in chair;with call bell/phone within reach;with chair alarm set;with family/visitor present   Nurse Communication Mobility status;Other (comment) (questions about DC)        Time: 2706-2376 OT Time Calculation (min): 35 min  Charges: OT General Charges $OT Visit: 1 Visit OT Treatments $Self Care/Home Management : 8-22 mins $Therapeutic Activity: 8-22 mins  Malachy Chamber, OTR/L Acute Rehab Services Office: [phone]   Layla Maw 04/15/2021, 10:26 AM

## 2021-04-16 ENCOUNTER — Telehealth: Payer: Self-pay | Admitting: Family Medicine

## 2021-04-16 NOTE — Telephone Encounter (Signed)
Pt wife call and stated she need a call back to talk about pt and she want dr.Burchette or his CMA to call her back.

## 2021-04-16 NOTE — Telephone Encounter (Signed)
Spoke with the patients wife. She had questions about medication changes and instructions from the hospital. A hospital follow up has been scheduled.  She stated that she would really like to come in to the office to speak with Dr. Elease Hashimoto face to face but Daiel is unable to be transported at this time. I explained to her that I did not know if this was possible since Adrik needs to be present for any appointments scheduled for him. She would like for me to ask Dr. Elease Hashimoto if there is any way to work this out.   She also stated that they are having some difficulty with the patients son, [name]. He is not relaying medical information to Mrs. Sergent when it is given to her. She does not want him involved if this can be helped.

## 2021-04-18 NOTE — Telephone Encounter (Signed)
I spoke with patient's wife and did my best to answer questions.

## 2021-04-19 ENCOUNTER — Other Ambulatory Visit: Payer: Self-pay | Admitting: Family Medicine

## 2021-04-20 ENCOUNTER — Other Ambulatory Visit: Payer: Self-pay

## 2021-04-20 ENCOUNTER — Telehealth (INDEPENDENT_AMBULATORY_CARE_PROVIDER_SITE_OTHER): Payer: Medicare Other | Admitting: Family Medicine

## 2021-04-20 DIAGNOSIS — I5032 Chronic diastolic (congestive) heart failure: Secondary | ICD-10-CM | POA: Diagnosis not present

## 2021-04-20 DIAGNOSIS — E039 Hypothyroidism, unspecified: Secondary | ICD-10-CM

## 2021-04-20 NOTE — Progress Notes (Signed)
Patient ID: Robert Rowe, male   DOB: 1918/02/14, 87 y.o.   MRN: [id number]  This visit type was conducted due to national recommendations for restrictions regarding the COVID-19 pandemic in an effort to limit this patient's exposure and mitigate transmission in our community.   Virtual Visit via Telephone Note  I connected with Robert Rowe on 04/20/21 at 10:30 AM EDT by telephone and verified that I am speaking with the correct person using two identifiers.   I discussed the limitations, risks, security and privacy concerns of performing an evaluation and management service by telephone and the availability of in person appointments. I also discussed with the patient that there may be a patient responsible charge related to this service. The patient expressed understanding and agreed to proceed.  Location patient: home Location provider: work or home office Participants present for the call: patient, provider, and spouse- Anne-Marie. Patient did not have a visit in the prior 7 days to address this/these issue(s).   History of Present Illness:  Robert Rowe is 87 years old and has history of diastolic heart failure, hypertension, history of stroke, hypothyroidism.  He was admitted on May 24 with some progressive wheezing and shortness of breath.  No peripheral edema.  BNP level 300.  O2 sats were 99% room air.  COVID testing and influenza screen negative.  Chest x-ray showed bibasilar atelectasis.  EKG no acute changes.  Patient had echocardiogram which showed EF of 65 to 70% with grade 2 diastolic dysfunction.  D-dimer was mildly elevated at 2.75 but unlikely PE given age adjustment.  Troponins negative.  CT chest no acute findings.  Diuresed well and sent home on Lasix 20 mg daily as needed.  Because of his prior stroke and debility wife has had difficulty weighing him.  She is also concerned about getting him here for follow-up  She states she is breathing well at this time.  They have home  physical therapy in place.  No peripheral edema.  Past Medical History:  Diagnosis Date  . Acute cholecystitis 12/2018  . BENIGN PROSTATIC HYPERTROPHY 04/05/2007  . 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 CHEST DISCOMFORT 04/25/2009  . Chronic diastolic CHF (congestive heart failure) ([place])    a. Echo 12/16: mild LVH, EF 55-60%, no RWMA, Gr 1 DD, mild AI, MAC, mild LAE, normal RVF, PASP 37 mmHg  . Chronic kidney disease    chronic   . DJD (degenerative joint disease)   . Dysrhythmia    pvc's   . GERD 12/03/2007  . Hiatal hernia   . History of nuclear stress test    Myoview 1/17: EF 53%, normal perfusion; Low Risk  . HYPERLIPIDEMIA 04/05/2007  . Hypothyroidism   . Osteoarth NOS-Unspec 04/05/2007  . Palpitations 02/12/2010  . PEDAL EDEMA 05/01/2008  . Rheumatoid arthritis(714.0) 04/05/2007  . Shortness of breath dyspnea   . TRANSIENT ISCHEMIC ATTACK, HX OF 04/05/2007   ?    Past Surgical History:  Procedure Laterality Date  . BALLOON DILATION N/A 03/12/2013   Procedure: BALLOON DILATION;  Surgeon: Inda Castle, MD;  Location: [facility];  Service: Endoscopy;  Laterality: N/A;  . CARPAL TUNNEL RELEASE  11/28/2018   Left hand  . CATARACT EXTRACTION  2001   bilateral  . CYSTOSCOPY WITH URETHRAL DILATATION N/A 09/24/2015   Procedure: CYSTOSCOPY WITH COOK BALLOON DILATATION OF URETHRAL STRICTURE ;  Surgeon: Carolan Clines, MD;  Location: [facility];  Service: Urology;  Laterality: N/A;  .  CYSTOSCOPY WITH URETHRAL DILATATION N/A 07/20/2016   Procedure: CYSTOSCOPY WITH URETHRAL DILATATION;  Surgeon: Alexis Frock, MD;  Location: [facility];  Service: Urology;  Laterality: N/A;  1 HOUR  . ESOPHAGOGASTRODUODENOSCOPY N/A 03/12/2013   Procedure: ESOPHAGOGASTRODUODENOSCOPY (EGD);  Surgeon: Inda Castle, MD;  Location: [facility];  Service: Endoscopy;  Laterality: N/A;  . HEMORRHOID SURGERY    . HERNIA REPAIR     inguinal x5  . KNEE SURGERY  arthroscopic   left  .  PROSTATE CRYOABLATION     2000  . SHOULDER SURGERY  few years ago   left  . TRANSURETHRAL RESECTION OF PROSTATE N/A 03/19/2015   Procedure: TRANSURETHRAL RESECTION OF THE PROSTATE (TURP);  Surgeon: Carolan Clines, MD;  Location: [facility];  Service: Urology;  Laterality: N/A;    reports that he quit smoking about 67 years ago. His smoking use included cigarettes. He has never used smokeless tobacco. He reports current alcohol use. He reports that he does not use drugs. family history includes Breast cancer in his mother; Heart attack in his father; Heart disease in his father; Lymphoma in his brother. Allergies  Allergen Reactions  . Codeine Other (See Comments)    Patient was "all over the place"- undesired side effect  . Sulfasalazine Other (See Comments)    Reaction not recalled  . Sulfonamide Derivatives Other (See Comments)    Reaction not recalled      Observations/Objective: Patient sounds cheerful and well on the phone. I do not appreciate any SOB. Speech and thought processing are grossly intact. Patient reported vitals:  Assessment and Plan:  #1 recent hospitalization for diastolic heart failure.  Improved with diuresis.  We discussed using home weights as a metric for guiding therapy about unable to weigh well at this time.  She is considering change of scale.  We did discuss possible regimen of Lasix 1 every Monday, Wednesday, and Friday if unable to weigh.  He also needs follow-up basic metabolic panel and wife plans to check with home health company to see if they have someone that control his labs there.  If not we will need to get him into the office at some point in the next couple weeks to check labs if he is to remain on the furosemide  #2 hypothyroidism-plan to check TSH with next lab draw.  He is overdue to get this checked  Follow Up Instructions:  -As above, needs follow-up labs in about 2 weeks   99441 5-10 99442 11-20 99443 21-30 I did not refer this  patient for an OV in the next 24 hours for this/these issue(s).  I discussed the assessment and treatment plan with the patient. The patient was provided an opportunity to ask questions and all were answered. The patient agreed with the plan and demonstrated an understanding of the instructions.   The patient was advised to call back or seek an in-person evaluation if the symptoms worsen or if the condition fails to improve as anticipated.  I provided 30 minutes of non-face-to-face time during this encounter.   Carolann Littler, MD

## 2021-04-21 ENCOUNTER — Telehealth: Payer: Self-pay | Admitting: Family Medicine

## 2021-04-21 DIAGNOSIS — N183 Chronic kidney disease, stage 3 unspecified: Secondary | ICD-10-CM | POA: Diagnosis not present

## 2021-04-21 DIAGNOSIS — E039 Hypothyroidism, unspecified: Secondary | ICD-10-CM | POA: Diagnosis not present

## 2021-04-21 DIAGNOSIS — I69351 Hemiplegia and hemiparesis following cerebral infarction affecting right dominant side: Secondary | ICD-10-CM | POA: Diagnosis not present

## 2021-04-21 DIAGNOSIS — E785 Hyperlipidemia, unspecified: Secondary | ICD-10-CM | POA: Diagnosis not present

## 2021-04-21 DIAGNOSIS — I130 Hypertensive heart and chronic kidney disease with heart failure and stage 1 through stage 4 chronic kidney disease, or unspecified chronic kidney disease: Secondary | ICD-10-CM | POA: Diagnosis not present

## 2021-04-21 DIAGNOSIS — I69391 Dysphagia following cerebral infarction: Secondary | ICD-10-CM | POA: Diagnosis not present

## 2021-04-21 DIAGNOSIS — I5032 Chronic diastolic (congestive) heart failure: Secondary | ICD-10-CM | POA: Diagnosis not present

## 2021-04-21 DIAGNOSIS — R1312 Dysphagia, oropharyngeal phase: Secondary | ICD-10-CM | POA: Diagnosis not present

## 2021-04-21 NOTE — Telephone Encounter (Signed)
Langston Masker a nurse is calling and stated that wife stated that provider wanted the home health agency to draw labs and they needed to know which labs. Caller can accept verbals order or fax order to [phone].  CB is [phone]

## 2021-04-22 DIAGNOSIS — E785 Hyperlipidemia, unspecified: Secondary | ICD-10-CM | POA: Diagnosis not present

## 2021-04-22 DIAGNOSIS — N183 Chronic kidney disease, stage 3 unspecified: Secondary | ICD-10-CM | POA: Diagnosis not present

## 2021-04-22 DIAGNOSIS — I130 Hypertensive heart and chronic kidney disease with heart failure and stage 1 through stage 4 chronic kidney disease, or unspecified chronic kidney disease: Secondary | ICD-10-CM | POA: Diagnosis not present

## 2021-04-22 DIAGNOSIS — I69351 Hemiplegia and hemiparesis following cerebral infarction affecting right dominant side: Secondary | ICD-10-CM | POA: Diagnosis not present

## 2021-04-22 DIAGNOSIS — I69391 Dysphagia following cerebral infarction: Secondary | ICD-10-CM | POA: Diagnosis not present

## 2021-04-22 DIAGNOSIS — E039 Hypothyroidism, unspecified: Secondary | ICD-10-CM | POA: Diagnosis not present

## 2021-04-22 DIAGNOSIS — R1312 Dysphagia, oropharyngeal phase: Secondary | ICD-10-CM | POA: Diagnosis not present

## 2021-04-22 DIAGNOSIS — I5032 Chronic diastolic (congestive) heart failure: Secondary | ICD-10-CM | POA: Diagnosis not present

## 2021-04-23 ENCOUNTER — Inpatient Hospital Stay: Payer: Medicare Other | Admitting: Family Medicine

## 2021-04-27 ENCOUNTER — Other Ambulatory Visit: Payer: Self-pay

## 2021-04-27 DIAGNOSIS — I69351 Hemiplegia and hemiparesis following cerebral infarction affecting right dominant side: Secondary | ICD-10-CM | POA: Diagnosis not present

## 2021-04-27 DIAGNOSIS — R1312 Dysphagia, oropharyngeal phase: Secondary | ICD-10-CM | POA: Diagnosis not present

## 2021-04-27 DIAGNOSIS — E785 Hyperlipidemia, unspecified: Secondary | ICD-10-CM | POA: Diagnosis not present

## 2021-04-27 DIAGNOSIS — I130 Hypertensive heart and chronic kidney disease with heart failure and stage 1 through stage 4 chronic kidney disease, or unspecified chronic kidney disease: Secondary | ICD-10-CM | POA: Diagnosis not present

## 2021-04-27 DIAGNOSIS — N183 Chronic kidney disease, stage 3 unspecified: Secondary | ICD-10-CM | POA: Diagnosis not present

## 2021-04-27 DIAGNOSIS — I69391 Dysphagia following cerebral infarction: Secondary | ICD-10-CM | POA: Diagnosis not present

## 2021-04-27 DIAGNOSIS — E039 Hypothyroidism, unspecified: Secondary | ICD-10-CM | POA: Diagnosis not present

## 2021-04-27 DIAGNOSIS — I5032 Chronic diastolic (congestive) heart failure: Secondary | ICD-10-CM | POA: Diagnosis not present

## 2021-04-27 NOTE — Telephone Encounter (Signed)
Spoke with [employer]. Orders have been given. Nothing further needed.

## 2021-04-29 ENCOUNTER — Encounter: Payer: Self-pay | Admitting: Family Medicine

## 2021-04-29 DIAGNOSIS — N183 Chronic kidney disease, stage 3 unspecified: Secondary | ICD-10-CM | POA: Diagnosis not present

## 2021-04-29 DIAGNOSIS — E785 Hyperlipidemia, unspecified: Secondary | ICD-10-CM | POA: Diagnosis not present

## 2021-04-29 DIAGNOSIS — R1312 Dysphagia, oropharyngeal phase: Secondary | ICD-10-CM | POA: Diagnosis not present

## 2021-04-29 DIAGNOSIS — I5032 Chronic diastolic (congestive) heart failure: Secondary | ICD-10-CM | POA: Diagnosis not present

## 2021-04-29 DIAGNOSIS — E039 Hypothyroidism, unspecified: Secondary | ICD-10-CM | POA: Diagnosis not present

## 2021-04-29 DIAGNOSIS — I69391 Dysphagia following cerebral infarction: Secondary | ICD-10-CM | POA: Diagnosis not present

## 2021-04-29 DIAGNOSIS — I69351 Hemiplegia and hemiparesis following cerebral infarction affecting right dominant side: Secondary | ICD-10-CM | POA: Diagnosis not present

## 2021-04-29 DIAGNOSIS — I130 Hypertensive heart and chronic kidney disease with heart failure and stage 1 through stage 4 chronic kidney disease, or unspecified chronic kidney disease: Secondary | ICD-10-CM | POA: Diagnosis not present

## 2021-05-03 DIAGNOSIS — E039 Hypothyroidism, unspecified: Secondary | ICD-10-CM | POA: Diagnosis not present

## 2021-05-03 DIAGNOSIS — R1312 Dysphagia, oropharyngeal phase: Secondary | ICD-10-CM | POA: Diagnosis not present

## 2021-05-03 DIAGNOSIS — I130 Hypertensive heart and chronic kidney disease with heart failure and stage 1 through stage 4 chronic kidney disease, or unspecified chronic kidney disease: Secondary | ICD-10-CM | POA: Diagnosis not present

## 2021-05-03 DIAGNOSIS — N183 Chronic kidney disease, stage 3 unspecified: Secondary | ICD-10-CM | POA: Diagnosis not present

## 2021-05-03 DIAGNOSIS — I69351 Hemiplegia and hemiparesis following cerebral infarction affecting right dominant side: Secondary | ICD-10-CM | POA: Diagnosis not present

## 2021-05-03 DIAGNOSIS — E785 Hyperlipidemia, unspecified: Secondary | ICD-10-CM | POA: Diagnosis not present

## 2021-05-03 DIAGNOSIS — I69391 Dysphagia following cerebral infarction: Secondary | ICD-10-CM | POA: Diagnosis not present

## 2021-05-03 DIAGNOSIS — I5032 Chronic diastolic (congestive) heart failure: Secondary | ICD-10-CM | POA: Diagnosis not present

## 2021-05-10 ENCOUNTER — Telehealth: Payer: Self-pay | Admitting: Family Medicine

## 2021-05-10 NOTE — Telephone Encounter (Signed)
ATC, unable to leave a message.  

## 2021-05-10 NOTE — Telephone Encounter (Signed)
Patients wife called to get lab results 

## 2021-05-10 NOTE — Telephone Encounter (Signed)
Please advise. I do not see any recent lab results, however, the patient does have a few future orders placed

## 2021-05-11 ENCOUNTER — Telehealth: Payer: Self-pay | Admitting: Family Medicine

## 2021-05-11 DIAGNOSIS — M6281 Muscle weakness (generalized): Secondary | ICD-10-CM

## 2021-05-11 DIAGNOSIS — E039 Hypothyroidism, unspecified: Secondary | ICD-10-CM | POA: Diagnosis not present

## 2021-05-11 DIAGNOSIS — I130 Hypertensive heart and chronic kidney disease with heart failure and stage 1 through stage 4 chronic kidney disease, or unspecified chronic kidney disease: Secondary | ICD-10-CM | POA: Diagnosis not present

## 2021-05-11 DIAGNOSIS — E785 Hyperlipidemia, unspecified: Secondary | ICD-10-CM | POA: Diagnosis not present

## 2021-05-11 DIAGNOSIS — I69391 Dysphagia following cerebral infarction: Secondary | ICD-10-CM | POA: Diagnosis not present

## 2021-05-11 DIAGNOSIS — R1312 Dysphagia, oropharyngeal phase: Secondary | ICD-10-CM | POA: Diagnosis not present

## 2021-05-11 DIAGNOSIS — I5032 Chronic diastolic (congestive) heart failure: Secondary | ICD-10-CM | POA: Diagnosis not present

## 2021-05-11 DIAGNOSIS — I69351 Hemiplegia and hemiparesis following cerebral infarction affecting right dominant side: Secondary | ICD-10-CM | POA: Diagnosis not present

## 2021-05-11 DIAGNOSIS — N183 Chronic kidney disease, stage 3 unspecified: Secondary | ICD-10-CM | POA: Diagnosis not present

## 2021-05-11 NOTE — Telephone Encounter (Signed)
Spoke with the patients wife. She stated that she believes home health and PT have been completed for the patient because she saw a paper the nurse had saying that today was his last visit. She is concerned that she will have no one to help her care for him. She also stated that one of the nurses are coming out tomorrow. I advised her to speak with them tomorrow when they come to the house and see if that is in fact their last visit with him and give [place] a call back so that we can figure out where to go from there.

## 2021-05-11 NOTE — Telephone Encounter (Signed)
Spoke with the patient's wife. She stated the home health nurse explained the results to her. Nothing further needed.

## 2021-05-11 NOTE — Telephone Encounter (Signed)
ATC, unable to leave a message.  

## 2021-05-11 NOTE — Telephone Encounter (Signed)
Pt wife call and stated she want dr. Elease Hashimoto to call her back todayl .

## 2021-05-12 DIAGNOSIS — N183 Chronic kidney disease, stage 3 unspecified: Secondary | ICD-10-CM | POA: Diagnosis not present

## 2021-05-12 DIAGNOSIS — I5032 Chronic diastolic (congestive) heart failure: Secondary | ICD-10-CM | POA: Diagnosis not present

## 2021-05-12 DIAGNOSIS — I69351 Hemiplegia and hemiparesis following cerebral infarction affecting right dominant side: Secondary | ICD-10-CM | POA: Diagnosis not present

## 2021-05-12 DIAGNOSIS — E785 Hyperlipidemia, unspecified: Secondary | ICD-10-CM | POA: Diagnosis not present

## 2021-05-12 DIAGNOSIS — I130 Hypertensive heart and chronic kidney disease with heart failure and stage 1 through stage 4 chronic kidney disease, or unspecified chronic kidney disease: Secondary | ICD-10-CM | POA: Diagnosis not present

## 2021-05-12 DIAGNOSIS — E039 Hypothyroidism, unspecified: Secondary | ICD-10-CM | POA: Diagnosis not present

## 2021-05-12 DIAGNOSIS — R1312 Dysphagia, oropharyngeal phase: Secondary | ICD-10-CM | POA: Diagnosis not present

## 2021-05-12 DIAGNOSIS — I69391 Dysphagia following cerebral infarction: Secondary | ICD-10-CM | POA: Diagnosis not present

## 2021-05-12 NOTE — Telephone Encounter (Signed)
Mrs. Quest called and stated that Robert Rowe has been discharged from physical therapy. She states she feels that the patient needs more help with his back and walking. She would like to see if we can get him back in physical therapy.   Please advise.  [phone] (cell)

## 2021-05-14 DIAGNOSIS — E785 Hyperlipidemia, unspecified: Secondary | ICD-10-CM | POA: Diagnosis not present

## 2021-05-14 DIAGNOSIS — N183 Chronic kidney disease, stage 3 unspecified: Secondary | ICD-10-CM | POA: Diagnosis not present

## 2021-05-14 DIAGNOSIS — I69391 Dysphagia following cerebral infarction: Secondary | ICD-10-CM | POA: Diagnosis not present

## 2021-05-14 DIAGNOSIS — R1312 Dysphagia, oropharyngeal phase: Secondary | ICD-10-CM | POA: Diagnosis not present

## 2021-05-14 DIAGNOSIS — E039 Hypothyroidism, unspecified: Secondary | ICD-10-CM | POA: Diagnosis not present

## 2021-05-14 DIAGNOSIS — I130 Hypertensive heart and chronic kidney disease with heart failure and stage 1 through stage 4 chronic kidney disease, or unspecified chronic kidney disease: Secondary | ICD-10-CM | POA: Diagnosis not present

## 2021-05-14 DIAGNOSIS — I69351 Hemiplegia and hemiparesis following cerebral infarction affecting right dominant side: Secondary | ICD-10-CM | POA: Diagnosis not present

## 2021-05-14 DIAGNOSIS — I5032 Chronic diastolic (congestive) heart failure: Secondary | ICD-10-CM | POA: Diagnosis not present

## 2021-05-14 NOTE — Telephone Encounter (Signed)
Spoke with [facility], to extend PT we will need to send in a new referral. Referral has been sent.

## 2021-05-14 NOTE — Telephone Encounter (Signed)
Spoke with the patients wife. She is aware a new referral has been placed.

## 2021-07-01 ENCOUNTER — Other Ambulatory Visit: Payer: Self-pay | Admitting: Family Medicine

## 2021-07-14 ENCOUNTER — Telehealth: Payer: Self-pay | Admitting: Family Medicine

## 2021-07-14 NOTE — Telephone Encounter (Signed)
Pt has a bad rash on both underarms. Care giver told Mrs. Garmon about this, this morning. have used cortisone cream. Rash does not seem to be painful or uncomfortable for the patient. Mrs. Nephew states that it is just very red. She would like to know what Dr. Elease Hashimoto recommends.   Call back number, [phone].

## 2021-07-14 NOTE — Telephone Encounter (Signed)
Left message for Mrs. Vereen to call back.

## 2021-07-14 NOTE — Telephone Encounter (Signed)
Patient spouse Soyla Murphy) wants to speak with Ria Comment.  Spouse can be contacted at [phone] or [phone].  Please advise.

## 2021-07-14 NOTE — Telephone Encounter (Signed)
Poke with the patients wife. She stated she was not aware he has a mychart account and does not know how to access this. She stated she will take a picture and bring it in for [place] to look at.

## 2021-07-14 NOTE — Telephone Encounter (Signed)
Pt wife call and want Ria Comment to give her a call.

## 2021-07-27 ENCOUNTER — Telehealth: Payer: Self-pay

## 2021-07-27 ENCOUNTER — Other Ambulatory Visit: Payer: Self-pay

## 2021-07-27 MED ORDER — ALBUTEROL SULFATE HFA 108 (90 BASE) MCG/ACT IN AERS: INHALATION_SPRAY | RESPIRATORY_TRACT | 2 refills | Status: DC

## 2021-07-27 NOTE — Telephone Encounter (Signed)
Wife of patient called requesting new paperwork for  [facility] # [phone]. Pt also need Rx refill  albuterol (PROAIR HFA) 108 (90 Base) MCG/ACT inhaler  Wife would like a call back.

## 2021-07-27 NOTE — Telephone Encounter (Addendum)
Spoke with wife, she is unsure of what paperwork to be sent.   She said the office has sent to [facility] few times.  Wife Annemarie cell phone- [phone]. Refill for Albuterol has been sent to [facility] on [address].   Please advise

## 2021-07-28 NOTE — Telephone Encounter (Signed)
Spoke to wife Annamarie about message and to scheduling a face to face virtual visit with Dr. Elease Hashimoto for restarting nursing services.   Patients son [name] will call to schedule appointment.

## 2021-07-28 NOTE — Telephone Encounter (Signed)
Called [facility], wife Ralene Bathe had called on 07/25/21, requesting nursing services to have someone come out to check on patient, due to how he was breathing etc.    The patient was discharge from services on 05/14/21.   Please resent order to [facility] to resume nursing services for the patient.    Fax number is [phone].

## 2021-08-02 ENCOUNTER — Telehealth (INDEPENDENT_AMBULATORY_CARE_PROVIDER_SITE_OTHER): Payer: Medicare Other | Admitting: Family Medicine

## 2021-08-02 ENCOUNTER — Other Ambulatory Visit: Payer: Self-pay

## 2021-08-02 DIAGNOSIS — Z8673 Personal history of transient ischemic attack (TIA), and cerebral infarction without residual deficits: Secondary | ICD-10-CM

## 2021-08-02 DIAGNOSIS — I5032 Chronic diastolic (congestive) heart failure: Secondary | ICD-10-CM

## 2021-08-02 DIAGNOSIS — E039 Hypothyroidism, unspecified: Secondary | ICD-10-CM

## 2021-08-02 DIAGNOSIS — R531 Weakness: Secondary | ICD-10-CM

## 2021-08-02 DIAGNOSIS — I10 Essential (primary) hypertension: Secondary | ICD-10-CM

## 2021-08-02 NOTE — Progress Notes (Signed)
Patient ID: Robert Rowe, male   DOB: 1918/01/30, 87 y.o.   MRN: [id number]  This visit type was conducted due to national recommendations for restrictions regarding the COVID-19 pandemic in an effort to limit this patient's exposure and mitigate transmission in our community.   Virtual Visit via Video Note  I connected with Robert Rowe on 08/02/21 at  1:45 PM EDT by a video enabled telemedicine application and verified that I am speaking with the correct person using two identifiers.  Location patient: home Location provider:work or home office Persons participating in the virtual visit: patient, provider  I discussed the limitations of evaluation and management by telemedicine and the availability of in person appointments. The patient expressed understanding and agreed to proceed.   HPI:  Robert Rowe is 87 year old with history of diastolic heart failure, hypertension, past history of CVA, hyperlipidemia, history of GERD, hypothyroidism.  We had received a call from his family recently that they are requesting home health come out again.  He has had extensive home health in the past with physical therapy, Occupational Therapy, and nursing care following his stroke.  They feel that he has benefited each time from physical therapy.  He is currently sitting in a wheelchair and requires assistance with transfers.  They are not aware of any bedsores.  They did note some recent swelling right upper extremity.  This was transient.  There is no injury.  No reported erythema or warmth.  No associated pain.  No lower extremity edema.  By this morning the edema seem to have resolved.  He is currently taking furosemide every Monday, Wednesday, and Friday and this regimen seems to be working fairly well for him.  His other medications are reviewed and include atorvastatin 40 mg daily, furosemide as above, levothyroxine 50 mcg daily, Plavix 75 mg daily, and Toprol-XL 25 mg daily.  Also on Protonix 40 mg  daily.  Swallowing well.  Had recent labs done in June and TSH was normal range.   ROS: See pertinent positives and negatives per HPI.  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 Echo 12/16: mild LVH, EF 55-60%, no RWMA, Gr 1 DD, mild AI, MAC, mild LAE, normal RVF, PASP 37 mmHg   Chronic kidney disease    chronic    DJD (degenerative joint disease)    Dysrhythmia    pvc's    GERD 12/03/2007   Hiatal hernia    History of nuclear stress test    Myoview 1/17: EF 53%, normal perfusion; Low Risk   HYPERLIPIDEMIA 04/05/2007   Hypothyroidism    Osteoarth NOS-Unspec 04/05/2007   Palpitations 02/12/2010   PEDAL EDEMA 05/01/2008   Rheumatoid arthritis(714.0) 04/05/2007   Shortness of breath dyspnea    TRANSIENT ISCHEMIC ATTACK, HX OF 04/05/2007   ?     Past Surgical History:  Procedure Laterality Date   BALLOON DILATION N/A 03/12/2013   Procedure: BALLOON DILATION;  Surgeon: Inda Castle, MD;  Location: [facility];  Service: Endoscopy;  Laterality: N/A;   CARPAL TUNNEL RELEASE  11/28/2018   Left hand   CATARACT EXTRACTION  2001   bilateral   CYSTOSCOPY WITH URETHRAL DILATATION N/A 09/24/2015   Procedure: CYSTOSCOPY WITH COOK BALLOON DILATATION OF URETHRAL STRICTURE ;  Surgeon: Carolan Clines,  MD;  Location: [facility];  Service: Urology;  Laterality: N/A;   CYSTOSCOPY WITH URETHRAL DILATATION N/A 07/20/2016   Procedure: CYSTOSCOPY WITH URETHRAL DILATATION;  Surgeon: Alexis Frock, MD;  Location: [facility];  Service: Urology;  Laterality: N/A;  1 HOUR   ESOPHAGOGASTRODUODENOSCOPY N/A 03/12/2013   Procedure: ESOPHAGOGASTRODUODENOSCOPY (EGD);  Surgeon: Inda Castle, MD;  Location: [facility];  Service: Endoscopy;  Laterality: N/A;   HEMORRHOID SURGERY     HERNIA REPAIR      inguinal x5   KNEE SURGERY  arthroscopic   left   PROSTATE CRYOABLATION     2000   SHOULDER SURGERY  few years ago   left   TRANSURETHRAL RESECTION OF PROSTATE N/A 03/19/2015   Procedure: TRANSURETHRAL RESECTION OF THE PROSTATE (TURP);  Surgeon: Carolan Clines, MD;  Location: [facility];  Service: Urology;  Laterality: N/A;    Family History  Problem Relation Age of Onset   Breast cancer Mother    Heart disease Father    Heart attack Father    Lymphoma Brother    Stroke Neg Hx    Hypertension Neg Hx     SOCIAL HX: Non-smoker.  Lives at home with wife.  Excellent family support.   Current Outpatient Medications:    acetaminophen (TYLENOL) 325 MG tablet, Take 650 mg by mouth at bedtime., Disp: , Rfl:    albuterol (PROAIR HFA) 108 (90 Base) MCG/ACT inhaler, INHALE 2 PUFFS INTO THE LUNGS EVERY 6 (SIX) HOURS AS NEEDED FOR WHEEZING (COUGH)., Disp: 8.5 g, Rfl: 2   atorvastatin (LIPITOR) 40 MG tablet, TAKE 1 TABLET (40 MG TOTAL) BY MOUTH DAILY AT 6 PM. (Patient taking differently: Take 40 mg by mouth daily.), Disp: 90 tablet, Rfl: 3   clopidogrel (PLAVIX) 75 MG tablet, TAKE 1 TABLET BY MOUTH  DAILY, Disp: 90 tablet, Rfl: 0   ketoconazole (NIZORAL) 2 % cream, APPLY 1 APPLICATION TOPICALLY 2 (TWO) TIMES DAILY AS NEEDED FOR IRRITATION., Disp: 30 g, Rfl: 0   latanoprost (XALATAN) 0.005 % ophthalmic solution, Place 1 drop into both eyes at bedtime. For Glaucoma, Disp: , Rfl: 99   levothyroxine (SYNTHROID) 50 MCG tablet, TAKE 1 TABLET BY MOUTH  DAILY (Patient taking differently: Take 50 mcg by mouth daily before breakfast.), Disp: 90 tablet, Rfl: 3   magnesium hydroxide (MILK OF MAGNESIA) 400 MG/5ML suspension, Take 30 mLs by mouth See admin instructions. Constipation (1 of 4 ) If no BM in 3 days, give 30 cc Milk of Magnesium p.o. x 1 dose in 24 hours as needed ( Do  Not use standing constipation orders for residents with renal failure CFR less than 30. Contact MD for orders), Disp: , Rfl:     meclizine (ANTIVERT) 12.5 MG tablet, Take 1 tablet (12.5 mg total) by mouth 3 (three) times daily as needed for dizziness., Disp: 30 tablet, Rfl: 0   metoprolol succinate (TOPROL-XL) 25 MG 24 hr tablet, TAKE 1 TABLET BY MOUTH AT  BEDTIME (Patient taking differently: Take 25 mg by mouth at bedtime.), Disp: 90 tablet, Rfl: 3   nitroGLYCERIN (NITROSTAT) 0.4 MG SL tablet, Place 1 tablet (0.4 mg total) under the tongue every 5 (five) minutes as needed (for Esophageal spasms)., Disp: 30 tablet, Rfl: 1   pantoprazole (PROTONIX) 40 MG tablet, TAKE 1 TABLET BY MOUTH EVERY DAY, Disp: 90 tablet, Rfl: 3   senna-docusate (SENOKOT-S) 8.6-50 MG tablet, Take 1 tablet by mouth at bedtime as needed for moderate constipation., Disp:  , Rfl:    triamcinolone (  KENALOG) 0.1 %, APPLY TO AFFECTED RASH TWICE DAILY AS NEEDED. (Patient taking differently: Apply 1 application topically 2 (two) times daily as needed (rash).), Disp: 30 g, Rfl: 2   furosemide (LASIX) 20 MG tablet, Take 1 tablet (20 mg total) by mouth daily as needed for fluid or edema (for increased weight of 3 pounds in one day or 5 pounds in one week)., Disp: 90 tablet, Rfl: 0  EXAM:  VITALS per patient if applicable:  GENERAL: alert, oriented, appears well and in no acute distress  HEENT: atraumatic, conjunttiva clear, no obvious abnormalities on inspection of external nose and ears  NECK: normal movements of the head and neck  LUNGS: on inspection no signs of respiratory distress, breathing rate appears normal, no obvious gross SOB, gasping or wheezing  CV: no obvious cyanosis  MS: moves all visible extremities without noticeable abnormality  PSYCH/NEURO: pleasant and cooperative, no obvious depression or anxiety, speech and thought processing grossly intact  ASSESSMENT AND PLAN:  Discussed the following assessment and plan:  #1 history of CVA.  Minimal ambulation.  Requiring assistance with transfers and multiple ADLs.  Family requesting nursing  consult to help monitor blood pressure, medication review, etc. He has had extensive physical therapy in the past and they would like to get repeat PT consult.  #2 history of hypothyroidism.  Recent TSH at goal. -Continue current dose of levothyroxine.  #3 history of diastolic heart failure.  No recent reports of dyspnea at rest or peripheral edema. -Continue current regimen of furosemide 20 mg every Monday, Wednesday, Friday.     I discussed the assessment and treatment plan with the patient. The patient was provided an opportunity to ask questions and all were answered. The patient agreed with the plan and demonstrated an understanding of the instructions.   The patient was advised to call back or seek an in-person evaluation if the symptoms worsen or if the condition fails to improve as anticipated.     Carolann Littler, MD

## 2021-09-20 ENCOUNTER — Other Ambulatory Visit: Payer: Self-pay | Admitting: Family Medicine

## 2021-10-09 ENCOUNTER — Other Ambulatory Visit: Payer: Self-pay | Admitting: Family Medicine

## 2021-10-14 ENCOUNTER — Other Ambulatory Visit: Payer: Self-pay | Admitting: Family Medicine

## 2021-10-18 ENCOUNTER — Other Ambulatory Visit: Payer: Self-pay | Admitting: Family Medicine

## 2021-10-22 ENCOUNTER — Other Ambulatory Visit: Payer: Self-pay | Admitting: Family Medicine

## 2021-11-01 ENCOUNTER — Telehealth: Payer: Self-pay | Admitting: Family Medicine

## 2021-11-01 NOTE — Telephone Encounter (Signed)
[name] (patients son) asked if patient needed a flu shot.  [name] could be contacted at [phone].  Please advise.

## 2021-11-02 NOTE — Telephone Encounter (Signed)
Spoke with patient's son. He is aware of Dr. Erick Blinks message and will see if he can find a way to get him here for the vaccine.

## 2021-11-09 ENCOUNTER — Emergency Department (HOSPITAL_COMMUNITY): Payer: Medicare Other

## 2021-11-09 ENCOUNTER — Inpatient Hospital Stay (HOSPITAL_COMMUNITY)
Admission: EM | Admit: 2021-11-09 | Discharge: 2021-11-17 | DRG: 871 | Disposition: A | Discharge status: Home Health | Payer: Medicare Other | Attending: Internal Medicine | Admitting: Internal Medicine

## 2021-11-09 ENCOUNTER — Encounter (HOSPITAL_COMMUNITY): Payer: Self-pay

## 2021-11-09 ENCOUNTER — Other Ambulatory Visit: Payer: Self-pay

## 2021-11-09 DIAGNOSIS — R140 Abdominal distension (gaseous): Secondary | ICD-10-CM | POA: Diagnosis not present

## 2021-11-09 DIAGNOSIS — Z20822 Contact with and (suspected) exposure to covid-19: Secondary | ICD-10-CM | POA: Diagnosis not present

## 2021-11-09 DIAGNOSIS — R000 Tachycardia, unspecified: Secondary | ICD-10-CM | POA: Diagnosis not present

## 2021-11-09 DIAGNOSIS — F0390 Unspecified dementia without behavioral disturbance: Secondary | ICD-10-CM | POA: Diagnosis present

## 2021-11-09 DIAGNOSIS — I69351 Hemiplegia and hemiparesis following cerebral infarction affecting right dominant side: Secondary | ICD-10-CM | POA: Diagnosis not present

## 2021-11-09 DIAGNOSIS — R1032 Left lower quadrant pain: Secondary | ICD-10-CM

## 2021-11-09 DIAGNOSIS — I214 Non-ST elevation (NSTEMI) myocardial infarction: Secondary | ICD-10-CM | POA: Diagnosis not present

## 2021-11-09 DIAGNOSIS — H5462 Unqualified visual loss, left eye, normal vision right eye: Secondary | ICD-10-CM | POA: Diagnosis present

## 2021-11-09 DIAGNOSIS — R062 Wheezing: Secondary | ICD-10-CM | POA: Diagnosis not present

## 2021-11-09 DIAGNOSIS — Z7401 Bed confinement status: Secondary | ICD-10-CM

## 2021-11-09 DIAGNOSIS — E039 Hypothyroidism, unspecified: Secondary | ICD-10-CM | POA: Diagnosis present

## 2021-11-09 DIAGNOSIS — E860 Dehydration: Secondary | ICD-10-CM | POA: Diagnosis not present

## 2021-11-09 DIAGNOSIS — Z79899 Other long term (current) drug therapy: Secondary | ICD-10-CM

## 2021-11-09 DIAGNOSIS — M1990 Unspecified osteoarthritis, unspecified site: Secondary | ICD-10-CM | POA: Diagnosis present

## 2021-11-09 DIAGNOSIS — Z885 Allergy status to narcotic agent status: Secondary | ICD-10-CM

## 2021-11-09 DIAGNOSIS — I5032 Chronic diastolic (congestive) heart failure: Secondary | ICD-10-CM | POA: Diagnosis present

## 2021-11-09 DIAGNOSIS — Z8673 Personal history of transient ischemic attack (TIA), and cerebral infarction without residual deficits: Secondary | ICD-10-CM

## 2021-11-09 DIAGNOSIS — N12 Tubulo-interstitial nephritis, not specified as acute or chronic: Secondary | ICD-10-CM | POA: Diagnosis not present

## 2021-11-09 DIAGNOSIS — N189 Chronic kidney disease, unspecified: Secondary | ICD-10-CM

## 2021-11-09 DIAGNOSIS — N179 Acute kidney failure, unspecified: Secondary | ICD-10-CM

## 2021-11-09 DIAGNOSIS — J9811 Atelectasis: Secondary | ICD-10-CM | POA: Diagnosis not present

## 2021-11-09 DIAGNOSIS — I5043 Acute on chronic combined systolic (congestive) and diastolic (congestive) heart failure: Secondary | ICD-10-CM | POA: Diagnosis not present

## 2021-11-09 DIAGNOSIS — K219 Gastro-esophageal reflux disease without esophagitis: Secondary | ICD-10-CM | POA: Diagnosis present

## 2021-11-09 DIAGNOSIS — Z79890 Hormone replacement therapy (postmenopausal): Secondary | ICD-10-CM

## 2021-11-09 DIAGNOSIS — N400 Enlarged prostate without lower urinary tract symptoms: Secondary | ICD-10-CM | POA: Diagnosis present

## 2021-11-09 DIAGNOSIS — R54 Age-related physical debility: Secondary | ICD-10-CM | POA: Diagnosis present

## 2021-11-09 DIAGNOSIS — Z882 Allergy status to sulfonamides status: Secondary | ICD-10-CM

## 2021-11-09 DIAGNOSIS — R059 Cough, unspecified: Secondary | ICD-10-CM | POA: Diagnosis not present

## 2021-11-09 DIAGNOSIS — M069 Rheumatoid arthritis, unspecified: Secondary | ICD-10-CM | POA: Diagnosis not present

## 2021-11-09 DIAGNOSIS — R109 Unspecified abdominal pain: Secondary | ICD-10-CM | POA: Diagnosis not present

## 2021-11-09 DIAGNOSIS — K5730 Diverticulosis of large intestine without perforation or abscess without bleeding: Secondary | ICD-10-CM | POA: Diagnosis not present

## 2021-11-09 DIAGNOSIS — Z7902 Long term (current) use of antithrombotics/antiplatelets: Secondary | ICD-10-CM

## 2021-11-09 DIAGNOSIS — E785 Hyperlipidemia, unspecified: Secondary | ICD-10-CM | POA: Diagnosis present

## 2021-11-09 DIAGNOSIS — K449 Diaphragmatic hernia without obstruction or gangrene: Secondary | ICD-10-CM | POA: Diagnosis not present

## 2021-11-09 DIAGNOSIS — N136 Pyonephrosis: Secondary | ICD-10-CM | POA: Diagnosis present

## 2021-11-09 DIAGNOSIS — J90 Pleural effusion, not elsewhere classified: Secondary | ICD-10-CM | POA: Diagnosis not present

## 2021-11-09 DIAGNOSIS — N200 Calculus of kidney: Secondary | ICD-10-CM | POA: Diagnosis not present

## 2021-11-09 DIAGNOSIS — R1930 Abdominal rigidity, unspecified site: Secondary | ICD-10-CM | POA: Diagnosis not present

## 2021-11-09 DIAGNOSIS — Z66 Do not resuscitate: Secondary | ICD-10-CM | POA: Diagnosis not present

## 2021-11-09 DIAGNOSIS — R6889 Other general symptoms and signs: Secondary | ICD-10-CM | POA: Diagnosis not present

## 2021-11-09 DIAGNOSIS — I480 Paroxysmal atrial fibrillation: Secondary | ICD-10-CM | POA: Diagnosis present

## 2021-11-09 DIAGNOSIS — N184 Chronic kidney disease, stage 4 (severe): Secondary | ICD-10-CM | POA: Diagnosis not present

## 2021-11-09 DIAGNOSIS — R9431 Abnormal electrocardiogram [ECG] [EKG]: Secondary | ICD-10-CM

## 2021-11-09 DIAGNOSIS — R0602 Shortness of breath: Secondary | ICD-10-CM | POA: Diagnosis not present

## 2021-11-09 DIAGNOSIS — Z87891 Personal history of nicotine dependence: Secondary | ICD-10-CM

## 2021-11-09 DIAGNOSIS — M4856XA Collapsed vertebra, not elsewhere classified, lumbar region, initial encounter for fracture: Secondary | ICD-10-CM | POA: Diagnosis not present

## 2021-11-09 DIAGNOSIS — R6521 Severe sepsis with septic shock: Secondary | ICD-10-CM | POA: Diagnosis present

## 2021-11-09 DIAGNOSIS — R1084 Generalized abdominal pain: Secondary | ICD-10-CM | POA: Diagnosis not present

## 2021-11-09 DIAGNOSIS — I130 Hypertensive heart and chronic kidney disease with heart failure and stage 1 through stage 4 chronic kidney disease, or unspecified chronic kidney disease: Secondary | ICD-10-CM | POA: Diagnosis not present

## 2021-11-09 DIAGNOSIS — A419 Sepsis, unspecified organism: Principal | ICD-10-CM

## 2021-11-09 DIAGNOSIS — Z87442 Personal history of urinary calculi: Secondary | ICD-10-CM

## 2021-11-09 DIAGNOSIS — Z8249 Family history of ischemic heart disease and other diseases of the circulatory system: Secondary | ICD-10-CM | POA: Diagnosis not present

## 2021-11-09 DIAGNOSIS — H409 Unspecified glaucoma: Secondary | ICD-10-CM | POA: Diagnosis present

## 2021-11-09 DIAGNOSIS — Z743 Need for continuous supervision: Secondary | ICD-10-CM | POA: Diagnosis not present

## 2021-11-09 LAB — URINALYSIS, ROUTINE W REFLEX MICROSCOPIC
Bilirubin Urine: NEGATIVE
Glucose, UA: NEGATIVE mg/dL
Ketones, ur: NEGATIVE mg/dL
Nitrite: NEGATIVE
Protein, ur: 100 mg/dL — AB
RBC / HPF: >50 RBC/hpf — ABNORMAL HIGH (ref 0–5)
Specific Gravity, Urine: 1.013 (ref 1.005–1.030)
WBC, UA: >50 WBC/hpf — ABNORMAL HIGH (ref 0–5)
pH: 6 (ref 5.0–8.0)

## 2021-11-09 LAB — COMPREHENSIVE METABOLIC PANEL
ALT: 12 U/L (ref 0–44)
AST: 16 U/L (ref 15–41)
Albumin: 3.5 g/dL (ref 3.5–5.0)
Alkaline Phosphatase: 86 U/L (ref 38–126)
Anion gap: 11 (ref 5–15)
BUN: 35 mg/dL — ABNORMAL HIGH (ref 8–23)
CO2: 25 mmol/L (ref 22–32)
Calcium: 8.9 mg/dL (ref 8.9–10.3)
Chloride: 101 mmol/L (ref 98–111)
Creatinine, Ser: 2.47 mg/dL — ABNORMAL HIGH (ref 0.61–1.24)
GFR, Estimated: 23 mL/min — ABNORMAL LOW (ref >60)
Glucose, Bld: 171 mg/dL — ABNORMAL HIGH (ref 70–99)
Potassium: 4.4 mmol/L (ref 3.5–5.1)
Sodium: 137 mmol/L (ref 135–145)
Total Bilirubin: 1 mg/dL (ref 0.3–1.2)
Total Protein: 6.8 g/dL (ref 6.5–8.1)

## 2021-11-09 LAB — LIPASE, BLOOD: Lipase: 36 U/L (ref 11–51)

## 2021-11-09 LAB — CBC WITH DIFFERENTIAL/PLATELET
Abs Immature Granulocytes: 0.08 10*3/uL — ABNORMAL HIGH (ref 0.00–0.07)
Basophils Absolute: 0.1 10*3/uL (ref 0.0–0.1)
Basophils Relative: 0 %
Eosinophils Absolute: 0 10*3/uL (ref 0.0–0.5)
Eosinophils Relative: 0 %
HCT: 44.3 % (ref 39.0–52.0)
Hemoglobin: 14.1 g/dL (ref 13.0–17.0)
Immature Granulocytes: 0 %
Lymphocytes Relative: 6 %
Lymphs Abs: 1.3 10*3/uL (ref 0.7–4.0)
MCH: 32 pg (ref 26.0–34.0)
MCHC: 31.8 g/dL (ref 30.0–36.0)
MCV: 100.7 fL — ABNORMAL HIGH (ref 80.0–100.0)
Monocytes Absolute: 1.2 10*3/uL — ABNORMAL HIGH (ref 0.1–1.0)
Monocytes Relative: 6 %
Neutro Abs: 17.9 10*3/uL — ABNORMAL HIGH (ref 1.7–7.7)
Neutrophils Relative %: 88 %
Platelets: 315 10*3/uL (ref 150–400)
RBC: 4.4 MIL/uL (ref 4.22–5.81)
RDW: 13.6 % (ref 11.5–15.5)
WBC: 20.5 10*3/uL — ABNORMAL HIGH (ref 4.0–10.5)
nRBC: 0 % (ref 0.0–0.2)

## 2021-11-09 LAB — LACTIC ACID, PLASMA: Lactic Acid, Venous: 2.5 mmol/L (ref 0.5–1.9)

## 2021-11-09 MED ORDER — SODIUM CHLORIDE 0.9 % IV BOLUS
500.0000 mL | Freq: Once | INTRAVENOUS | Status: AC
  Administered 2021-11-09: 22:00:00 500 mL via INTRAVENOUS

## 2021-11-09 MED ORDER — FENTANYL CITRATE PF 50 MCG/ML IJ SOSY: 50.0000 ug | PREFILLED_SYRINGE | Freq: Once | INTRAMUSCULAR | Status: DC

## 2021-11-09 MED ORDER — SODIUM CHLORIDE 0.9 % IV SOLN
2.0000 g | Freq: Once | INTRAVENOUS | Status: AC
  Administered 2021-11-10: 01:00:00 2 g via INTRAVENOUS
  Filled 2021-11-09: qty 20

## 2021-11-09 NOTE — ED Notes (Signed)
Patient transported to CT 

## 2021-11-09 NOTE — ED Triage Notes (Signed)
Approximately one hour ago pt had a sudden onset of LLQ abdominal pain, with some generalized abdominal rigidity. Pt had 2 large bowel movements yesterday none today.

## 2021-11-09 NOTE — ED Provider Notes (Signed)
[facility] EMERGENCY DEPARTMENT Provider Note   CSN: [id number] Arrival date & time: 11/09/21  1820     History Chief Complaint  Patient presents with   Abdominal Pain    Robert Rowe is a 87 y.o. male.  The history is provided by the patient.  Abdominal Pain Pain location:  LLQ and suprapubic Pain quality: not aching   Pain radiates to:  Does not radiate Pain severity:  Severe Onset quality:  Sudden Duration:  1 hour Timing:  Constant Progression:  Unchanged Chronicity:  New Relieved by:  Nothing Worsened by:  Nothing Associated symptoms: constipation and fatigue   Associated symptoms: no chest pain, no chills, no cough, no diarrhea, no dysuria, no fever, no flatus, no nausea, no shortness of breath and no vomiting       Past Medical History:  Diagnosis Date   Acute cholecystitis 12/2018   BENIGN PROSTATIC HYPERTROPHY 04/05/2007   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 Echo 12/16: mild LVH, EF 55-60%, no RWMA, Gr 1 DD, mild AI, MAC, mild LAE, normal RVF, PASP 37 mmHg   Chronic kidney disease    chronic    DJD (degenerative joint disease)    Dysrhythmia    pvc's    GERD 12/03/2007   Hiatal hernia    History of nuclear stress test    Myoview 1/17: EF 53%, normal perfusion; Low Risk   HYPERLIPIDEMIA 04/05/2007   Hypothyroidism    Osteoarth NOS-Unspec 04/05/2007   Palpitations 02/12/2010   PEDAL EDEMA 05/01/2008   Rheumatoid arthritis(714.0) 04/05/2007   Shortness of breath dyspnea    TRANSIENT ISCHEMIC ATTACK, HX OF 04/05/2007   ?     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   Gait disturbance, post-stroke    BPV (benign positional vertigo) 01/02/2019    History of stroke 01/01/2019   TIA (transient ischemic attack) 12/31/2018   CVA (cerebral vascular accident) ([place]) 12/31/2018   Chest pain 05/15/2018   CKD (chronic kidney disease) stage 3, GFR 30-59 ml/min ([place]) 05/08/2018   Weakness generalized 06/13/2017   Abnormal diffusion capacity determined by pulmonary function test 06/13/2017   Chronic diastolic CHF (congestive heart failure) ([place]) 11/01/2016   Essential hypertension 11/01/2016   Intracranial vascular stenosis 11/09/2015   Mild cognitive impairment 11/09/2015   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   Benign prostatic hyperplasia with urinary obstruction 03/19/2015   Awareness alteration, transient 02/26/2015   PVC's (premature ventricular contractions) 08/22/2013   Hypothyroidism 05/09/2013   Stricture and stenosis of esophagus 03/12/2013   Dysphagia 02/22/2013   DOE (dyspnea on exertion) 03/23/2011   PALPITATIONS 02/12/2010   CHEST DISCOMFORT 04/25/2009   PEDAL EDEMA 05/01/2008   GERD 12/03/2007   Dyslipidemia 04/05/2007   BENIGN PROSTATIC HYPERTROPHY 04/05/2007   Rheumatoid arthritis ([place]) 04/05/2007   Osteoarthritis 04/05/2007   History of cardiovascular disorder 04/05/2007    Past Surgical History:  Procedure Laterality Date   BALLOON DILATION N/A 03/12/2013   Procedure: BALLOON DILATION;  Surgeon: Inda Castle, MD;  Location: [facility];  Service: Endoscopy;  Laterality: N/A;   CARPAL TUNNEL RELEASE  11/28/2018   Left hand   CATARACT EXTRACTION  2001   bilateral  CYSTOSCOPY WITH URETHRAL DILATATION N/A 09/24/2015   Procedure: CYSTOSCOPY WITH COOK BALLOON DILATATION OF URETHRAL STRICTURE ;  Surgeon: Carolan Clines, MD;  Location: [facility];  Service: Urology;  Laterality: N/A;   CYSTOSCOPY WITH URETHRAL DILATATION N/A 07/20/2016   Procedure: CYSTOSCOPY WITH URETHRAL DILATATION;  Surgeon: Alexis Frock, MD;  Location: [facility];  Service: Urology;  Laterality: N/A;  1 HOUR   ESOPHAGOGASTRODUODENOSCOPY N/A  03/12/2013   Procedure: ESOPHAGOGASTRODUODENOSCOPY (EGD);  Surgeon: Inda Castle, MD;  Location: [facility];  Service: Endoscopy;  Laterality: N/A;   HEMORRHOID SURGERY     HERNIA REPAIR     inguinal x5   KNEE SURGERY  arthroscopic   left   PROSTATE CRYOABLATION     2000   SHOULDER SURGERY  few years ago   left   TRANSURETHRAL RESECTION OF PROSTATE N/A 03/19/2015   Procedure: TRANSURETHRAL RESECTION OF THE PROSTATE (TURP);  Surgeon: Carolan Clines, MD;  Location: [facility];  Service: Urology;  Laterality: N/A;       Family History  Problem Relation Age of Onset   Breast cancer Mother    Heart disease Father    Heart attack Father    Lymphoma Brother    Stroke Neg Hx    Hypertension Neg Hx     Social History   Tobacco Use   Smoking status: Former    Types: Cigarettes    Quit date: 08/13/1953    Years since quitting: 68.2   Smokeless tobacco: Never   Tobacco comments:    1942-1954 up to 1 pack/day  Vaping Use   Vaping Use: Never used  Substance Use Topics   Alcohol use: Yes    Comment: occ vodka   Drug use: No    Home Medications Prior to Admission medications   Medication Sig Start Date End Date Taking? Authorizing Provider  acetaminophen (TYLENOL) 325 MG tablet Take 650 mg by mouth at bedtime.    [provider]  albuterol (VENTOLIN HFA) 108 (90 Base) MCG/ACT inhaler INHALE 2 PUFFS INTO THE LUNGS EVERY 6 HOURS AS NEEDED FOR WHEEZING (COUGH). 10/11/21   Burchette, Alinda Sierras, MD  atorvastatin (LIPITOR) 40 MG tablet TAKE 1 TABLET (40 MG TOTAL) BY MOUTH DAILY AT 6 PM. Patient taking differently: Take 40 mg by mouth daily. 02/16/21   Burchette, Alinda Sierras, MD  clopidogrel (PLAVIX) 75 MG tablet TAKE 1 TABLET BY MOUTH  DAILY 09/21/21   Burchette, Alinda Sierras, MD  furosemide (LASIX) 20 MG tablet Take 1 tablet (20 mg total) by mouth daily as needed for fluid or edema (for increased weight of 3 pounds in one day or 5 pounds in one week). 04/15/21 07/14/21  [place] Donnamarie Poag,  DO  ketoconazole (NIZORAL) 2 % cream APPLY 1 APPLICATION TOPICALLY 2 (TWO) TIMES DAILY AS NEEDED FOR IRRITATION. 06/12/20   Laurey Morale, MD  latanoprost (XALATAN) 0.005 % ophthalmic solution Place 1 drop into both eyes at bedtime. For Glaucoma 02/08/18   [provider]  levothyroxine (SYNTHROID) 50 MCG tablet TAKE 1 TABLET BY MOUTH  DAILY 10/18/21   Burchette, Alinda Sierras, MD  magnesium hydroxide (MILK OF MAGNESIA) 400 MG/5ML suspension Take 30 mLs by mouth See admin instructions. Constipation (1 of 4 ) If no BM in 3 days, give 30 cc Milk of Magnesium p.o. x 1 dose in 24 hours as needed ( Do  Not use standing constipation orders for residents with renal failure CFR less than 30. Contact MD for orders)    [provider]  meclizine (ANTIVERT) 12.5 MG tablet Take 1 tablet (12.5 mg total) by mouth 3 (three) times daily as needed for dizziness. 01/10/19   Angiulli, Lavon Paganini, PA-C  metoprolol succinate (TOPROL-XL) 25 MG 24 hr tablet TAKE 1 TABLET BY MOUTH AT  BEDTIME 10/25/21   Burchette, Alinda Sierras, MD  nitroGLYCERIN (NITROSTAT) 0.4 MG SL tablet Place 1 tablet (0.4 mg total) under the tongue every 5 (five) minutes as needed (for Esophageal spasms). 03/22/21   Burchette, Alinda Sierras, MD  pantoprazole (PROTONIX) 40 MG tablet TAKE 1 TABLET BY MOUTH EVERY DAY 04/20/21   Burchette, Alinda Sierras, MD  senna-docusate (SENOKOT-S) 8.6-50 MG tablet Take 1 tablet by mouth at bedtime as needed for moderate constipation. 05/20/19   Georgette Shell, MD  triamcinolone (KENALOG) 0.1 % APPLY TO AFFECTED RASH TWICE DAILY AS NEEDED. Patient taking differently: Apply 1 application topically 2 (two) times daily as needed (rash). 10/14/20   Burchette, Alinda Sierras, MD    Allergies    Codeine, Sulfasalazine, and Sulfonamide derivatives  Review of Systems   Review of Systems  Constitutional:  Positive for fatigue. Negative for chills, diaphoresis and fever.  HENT:  Negative for congestion.   Respiratory:  Negative for cough,  chest tightness, shortness of breath and wheezing.   Cardiovascular:  Negative for chest pain and palpitations.  Gastrointestinal:  Positive for abdominal pain and constipation. Negative for abdominal distention, anal bleeding, blood in stool, diarrhea, flatus, nausea and vomiting.  Genitourinary:  Negative for decreased urine volume, dysuria, flank pain and frequency.  Musculoskeletal:  Negative for back pain and neck pain.  Skin:  Negative for rash and wound.  Neurological:  Negative for light-headedness and headaches.  Psychiatric/Behavioral:  Negative for agitation.   All other systems reviewed and are negative.  Physical Exam Updated Vital Signs BP (!) 123/59 (BP Location: Left Arm)    Pulse 97    Temp (!) 97.3 F (36.3 C) (Oral)    Resp (!) 26    Ht _0  (1.651 m)    Wt 72.6 kg    SpO2 100%    BMI 26.63 kg/m   Physical Exam Vitals and nursing note reviewed.  Constitutional:      General: He is not in acute distress.    Appearance: He is well-developed. He is not ill-appearing, toxic-appearing or diaphoretic.  HENT:     Head: Normocephalic and atraumatic.  Eyes:     Conjunctiva/sclera: Conjunctivae normal.  Cardiovascular:     Rate and Rhythm: Regular rhythm. Tachycardia present.     Heart sounds: No murmur heard. Pulmonary:     Effort: Pulmonary effort is normal. No respiratory distress.     Breath sounds: Normal breath sounds. No wheezing, rhonchi or rales.  Chest:     Chest wall: No tenderness.  Abdominal:     General: Abdomen is flat. Bowel sounds are normal. There is no distension.     Palpations: Abdomen is soft.     Tenderness: There is abdominal tenderness in the suprapubic area and left lower quadrant. There is no left CVA tenderness.  Musculoskeletal:        General: No swelling.     Cervical back: Neck supple.  Skin:    General: Skin is warm and dry.     Capillary Refill: Capillary refill takes less than 2 seconds.     Findings: No rash.  Neurological:      General: No focal deficit present.     Mental Status: He  is alert.  Psychiatric:        Mood and Affect: Mood normal.    ED Results / Procedures / Treatments   Labs (all labs ordered are listed, but only abnormal results are displayed) Labs Reviewed  CBC WITH DIFFERENTIAL/PLATELET - Abnormal; Notable for the following components:      Result Value   WBC 20.5 (*)    MCV 100.7 (*)    Neutro Abs 17.9 (*)    Monocytes Absolute 1.2 (*)    Abs Immature Granulocytes 0.08 (*)    All other components within normal limits  COMPREHENSIVE METABOLIC PANEL - Abnormal; Notable for the following components:   Glucose, Bld 171 (*)    BUN 35 (*)    Creatinine, Ser 2.47 (*)    GFR, Estimated 23 (*)    All other components within normal limits  LACTIC ACID, PLASMA - Abnormal; Notable for the following components:   Lactic Acid, Venous 2.5 (*)    All other components within normal limits  URINALYSIS, ROUTINE W REFLEX MICROSCOPIC - Abnormal; Notable for the following components:   Color, Urine AMBER (*)    APPearance TURBID (*)    Hgb urine dipstick LARGE (*)    Protein, ur 100 (*)    Leukocytes,Ua LARGE (*)    RBC / HPF >50 (*)    WBC, UA >50 (*)    Bacteria, UA MANY (*)    All other components within normal limits  RESP PANEL BY RT-PCR (FLU A&B, COVID) ARPGX2  URINE CULTURE  LIPASE, BLOOD  LACTIC ACID, PLASMA    EKG EKG Interpretation  Date/Time:  Tuesday November 09 2021 18:40:40 EST Ventricular Rate:  101 PR Interval:  119 QRS Duration: 152 QT Interval:  382 QTC Calculation: 496 R Axis:   231 Text Interpretation: Sinus tachycardia Nonspecific intraventricular conduction delay Artifact in lead(s) I II III aVR aVL aVF V1 V2 V3 V4 V5 V6 When compared tp prior, more artifact. No STEMI Confirmed by Antony Blackbird [phone]) on 11/09/2021 6:57:16 PM  Radiology No results found.  Procedures Procedures   Medications Ordered in ED Medications  fentaNYL (SUBLIMAZE) injection 50 mcg  (50 mcg Intravenous Not Given 11/09/21 2055)  sodium chloride 0.9 % bolus 500 mL (0 mLs Intravenous Stopped 11/09/21 2230)    ED Course  I have reviewed the triage vital signs and the nursing notes.  Pertinent labs & imaging results that were available during my care of the patient were reviewed by me and considered in my medical decision making (see chart for details).    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  According to patient and family, approximately 1 hour prior to arrival, patient had sudden onset of central left lower abdominal discomfort.  He reports it as 10 out of 10 in severity and was sudden onset.  He denies any trauma and denies any nausea or vomiting.  He does report he has had some constipation today which is atypical for him and he also has not passed gases much today.  He denies any urinary changes or skin changes.  Denies fevers, chills congestion, cough, chest pain, shortness of breath.  On exam, patient does have tenderness in his central left lower abdomen.  Denies any testicle or groin symptoms.  No pain in his back or flank.  Lungs clear and chest nontender.  Patient does have palpable pulses in all extremities.  Had a shared decision made conversation with patient and family.  The sudden onset abdominal pain is a concern especially when he has had a previous CT 2 years ago that showed ectatic enlarged aorta that is prone for aneurysm development.  After speaking with family, we did agree to get imaging to rule out concerning cause of the symptoms and to help determine the cause of discomfort.  We discussed it could be constipation, diverticulitis, bowel obstruction, kidney stone, or more  concerning could be aortic aneurysm or dissection.  His blood pressure was in the 160s on my reassessment but then was downtrending.  Patient would like some pain medicine for the tenderness and pain so we will give some fentanyl.  We will get screening labs and get a dissection study to evaluate.  Anticipate reassessment after work-up to determine disposition.  Patient's work-up has started to return.  Unfortunately, patient does have evidence of acute kidney injury and his GFR is below the cutoff for contrast.  Had a discussion with family we will instead do a noncontrasted CT to help look for stone, obstruction, diverticulitis, and would still be able to assess for an enlarged aorta.  If it does look like an aortic cause, would likely have a discussion about either doing CTA and ignore the kidney function after some rehydration versus ultrasound.  Due to the AKI, leukocytosis, and age and ill appearance, anticipate admission when work-up is completed.  Care transferred to oncoming team while awaiting for results.  Anticipate admission for AKI and lab abnormalities if CT imaging is reassuring.  Care transferred to Dr. Tyrone Nine to avoid further work-up and admission.   Final Clinical Impression(s) / ED Diagnoses Final diagnoses:  Left lower quadrant abdominal pain     Clinical Impression: 1. Left lower quadrant abdominal pain     Disposition: Care transferred to Dr. Tyrone Nine while awaiting work-up to be completed and for likely admission.  This note was prepared with assistance of Systems analyst. Occasional wrong-word or sound-a-like substitutions may have occurred due to the inherent limitations of voice recognition software.     Jenese Mischke, Gwenyth Allegra, MD 11/09/21 [phone]

## 2021-11-10 ENCOUNTER — Encounter (HOSPITAL_COMMUNITY): Payer: Self-pay | Admitting: Family Medicine

## 2021-11-10 DIAGNOSIS — R059 Cough, unspecified: Secondary | ICD-10-CM | POA: Diagnosis not present

## 2021-11-10 DIAGNOSIS — Z87442 Personal history of urinary calculi: Secondary | ICD-10-CM | POA: Diagnosis not present

## 2021-11-10 DIAGNOSIS — H5462 Unqualified visual loss, left eye, normal vision right eye: Secondary | ICD-10-CM | POA: Diagnosis present

## 2021-11-10 DIAGNOSIS — N400 Enlarged prostate without lower urinary tract symptoms: Secondary | ICD-10-CM | POA: Diagnosis present

## 2021-11-10 DIAGNOSIS — R062 Wheezing: Secondary | ICD-10-CM | POA: Diagnosis not present

## 2021-11-10 DIAGNOSIS — E785 Hyperlipidemia, unspecified: Secondary | ICD-10-CM | POA: Diagnosis present

## 2021-11-10 DIAGNOSIS — A419 Sepsis, unspecified organism: Secondary | ICD-10-CM | POA: Diagnosis present

## 2021-11-10 DIAGNOSIS — K219 Gastro-esophageal reflux disease without esophagitis: Secondary | ICD-10-CM | POA: Diagnosis present

## 2021-11-10 DIAGNOSIS — I480 Paroxysmal atrial fibrillation: Secondary | ICD-10-CM | POA: Diagnosis present

## 2021-11-10 DIAGNOSIS — Z7401 Bed confinement status: Secondary | ICD-10-CM | POA: Diagnosis not present

## 2021-11-10 DIAGNOSIS — R0602 Shortness of breath: Secondary | ICD-10-CM | POA: Diagnosis not present

## 2021-11-10 DIAGNOSIS — N179 Acute kidney failure, unspecified: Secondary | ICD-10-CM | POA: Diagnosis not present

## 2021-11-10 DIAGNOSIS — N12 Tubulo-interstitial nephritis, not specified as acute or chronic: Secondary | ICD-10-CM | POA: Diagnosis not present

## 2021-11-10 DIAGNOSIS — E860 Dehydration: Secondary | ICD-10-CM | POA: Diagnosis present

## 2021-11-10 DIAGNOSIS — N184 Chronic kidney disease, stage 4 (severe): Secondary | ICD-10-CM | POA: Diagnosis present

## 2021-11-10 DIAGNOSIS — M069 Rheumatoid arthritis, unspecified: Secondary | ICD-10-CM | POA: Diagnosis present

## 2021-11-10 DIAGNOSIS — Z66 Do not resuscitate: Secondary | ICD-10-CM | POA: Diagnosis present

## 2021-11-10 DIAGNOSIS — K449 Diaphragmatic hernia without obstruction or gangrene: Secondary | ICD-10-CM | POA: Diagnosis present

## 2021-11-10 DIAGNOSIS — N189 Chronic kidney disease, unspecified: Secondary | ICD-10-CM

## 2021-11-10 DIAGNOSIS — I5032 Chronic diastolic (congestive) heart failure: Secondary | ICD-10-CM | POA: Diagnosis not present

## 2021-11-10 DIAGNOSIS — Z20822 Contact with and (suspected) exposure to covid-19: Secondary | ICD-10-CM | POA: Diagnosis present

## 2021-11-10 DIAGNOSIS — F0390 Unspecified dementia without behavioral disturbance: Secondary | ICD-10-CM | POA: Diagnosis present

## 2021-11-10 DIAGNOSIS — R6521 Severe sepsis with septic shock: Secondary | ICD-10-CM | POA: Diagnosis present

## 2021-11-10 DIAGNOSIS — I69351 Hemiplegia and hemiparesis following cerebral infarction affecting right dominant side: Secondary | ICD-10-CM | POA: Diagnosis not present

## 2021-11-10 DIAGNOSIS — Z8249 Family history of ischemic heart disease and other diseases of the circulatory system: Secondary | ICD-10-CM | POA: Diagnosis not present

## 2021-11-10 DIAGNOSIS — N136 Pyonephrosis: Secondary | ICD-10-CM | POA: Diagnosis present

## 2021-11-10 DIAGNOSIS — J9811 Atelectasis: Secondary | ICD-10-CM | POA: Diagnosis not present

## 2021-11-10 DIAGNOSIS — R9431 Abnormal electrocardiogram [ECG] [EKG]: Secondary | ICD-10-CM

## 2021-11-10 DIAGNOSIS — Z8673 Personal history of transient ischemic attack (TIA), and cerebral infarction without residual deficits: Secondary | ICD-10-CM

## 2021-11-10 DIAGNOSIS — I130 Hypertensive heart and chronic kidney disease with heart failure and stage 1 through stage 4 chronic kidney disease, or unspecified chronic kidney disease: Secondary | ICD-10-CM | POA: Diagnosis present

## 2021-11-10 DIAGNOSIS — J90 Pleural effusion, not elsewhere classified: Secondary | ICD-10-CM | POA: Diagnosis not present

## 2021-11-10 DIAGNOSIS — R1032 Left lower quadrant pain: Secondary | ICD-10-CM | POA: Diagnosis present

## 2021-11-10 DIAGNOSIS — I214 Non-ST elevation (NSTEMI) myocardial infarction: Secondary | ICD-10-CM | POA: Diagnosis not present

## 2021-11-10 DIAGNOSIS — I5043 Acute on chronic combined systolic (congestive) and diastolic (congestive) heart failure: Secondary | ICD-10-CM | POA: Diagnosis present

## 2021-11-10 DIAGNOSIS — E039 Hypothyroidism, unspecified: Secondary | ICD-10-CM | POA: Diagnosis present

## 2021-11-10 LAB — COMPREHENSIVE METABOLIC PANEL
ALT: 10 U/L (ref 0–44)
AST: 14 U/L — ABNORMAL LOW (ref 15–41)
Albumin: 2.5 g/dL — ABNORMAL LOW (ref 3.5–5.0)
Alkaline Phosphatase: 59 U/L (ref 38–126)
Anion gap: 7 (ref 5–15)
BUN: 33 mg/dL — ABNORMAL HIGH (ref 8–23)
CO2: 24 mmol/L (ref 22–32)
Calcium: 8.3 mg/dL — ABNORMAL LOW (ref 8.9–10.3)
Chloride: 107 mmol/L (ref 98–111)
Creatinine, Ser: 2.3 mg/dL — ABNORMAL HIGH (ref 0.61–1.24)
GFR, Estimated: 25 mL/min — ABNORMAL LOW (ref >60)
Glucose, Bld: 133 mg/dL — ABNORMAL HIGH (ref 70–99)
Potassium: 4.3 mmol/L (ref 3.5–5.1)
Sodium: 138 mmol/L (ref 135–145)
Total Bilirubin: 0.7 mg/dL (ref 0.3–1.2)
Total Protein: 4.9 g/dL — ABNORMAL LOW (ref 6.5–8.1)

## 2021-11-10 LAB — CBC WITH DIFFERENTIAL/PLATELET
Abs Immature Granulocytes: 0.05 10*3/uL (ref 0.00–0.07)
Basophils Absolute: 0.1 10*3/uL (ref 0.0–0.1)
Basophils Relative: 0 %
Eosinophils Absolute: 0 10*3/uL (ref 0.0–0.5)
Eosinophils Relative: 0 %
HCT: 33.4 % — ABNORMAL LOW (ref 39.0–52.0)
Hemoglobin: 10.8 g/dL — ABNORMAL LOW (ref 13.0–17.0)
Immature Granulocytes: 0 %
Lymphocytes Relative: 11 %
Lymphs Abs: 2 10*3/uL (ref 0.7–4.0)
MCH: 32.7 pg (ref 26.0–34.0)
MCHC: 32.3 g/dL (ref 30.0–36.0)
MCV: 101.2 fL — ABNORMAL HIGH (ref 80.0–100.0)
Monocytes Absolute: 1.5 10*3/uL — ABNORMAL HIGH (ref 0.1–1.0)
Monocytes Relative: 8 %
Neutro Abs: 14.4 10*3/uL — ABNORMAL HIGH (ref 1.7–7.7)
Neutrophils Relative %: 81 %
Platelets: 235 10*3/uL (ref 150–400)
RBC: 3.3 MIL/uL — ABNORMAL LOW (ref 4.22–5.81)
RDW: 13.7 % (ref 11.5–15.5)
WBC: 18 10*3/uL — ABNORMAL HIGH (ref 4.0–10.5)
nRBC: 0 % (ref 0.0–0.2)

## 2021-11-10 LAB — CBC
HCT: 34 % — ABNORMAL LOW (ref 39.0–52.0)
Hemoglobin: 10.5 g/dL — ABNORMAL LOW (ref 13.0–17.0)
MCH: 31.6 pg (ref 26.0–34.0)
MCHC: 30.9 g/dL (ref 30.0–36.0)
MCV: 102.4 fL — ABNORMAL HIGH (ref 80.0–100.0)
Platelets: 221 10*3/uL (ref 150–400)
RBC: 3.32 MIL/uL — ABNORMAL LOW (ref 4.22–5.81)
RDW: 13.7 % (ref 11.5–15.5)
WBC: 16.8 10*3/uL — ABNORMAL HIGH (ref 4.0–10.5)
nRBC: 0 % (ref 0.0–0.2)

## 2021-11-10 LAB — BASIC METABOLIC PANEL
Anion gap: 8 (ref 5–15)
BUN: 35 mg/dL — ABNORMAL HIGH (ref 8–23)
CO2: 24 mmol/L (ref 22–32)
Calcium: 8.2 mg/dL — ABNORMAL LOW (ref 8.9–10.3)
Chloride: 104 mmol/L (ref 98–111)
Creatinine, Ser: 2.26 mg/dL — ABNORMAL HIGH (ref 0.61–1.24)
GFR, Estimated: 25 mL/min — ABNORMAL LOW (ref >60)
Glucose, Bld: 134 mg/dL — ABNORMAL HIGH (ref 70–99)
Potassium: 4.2 mmol/L (ref 3.5–5.1)
Sodium: 136 mmol/L (ref 135–145)

## 2021-11-10 LAB — LACTIC ACID, PLASMA
Lactic Acid, Venous: 2.5 mmol/L (ref 0.5–1.9)
Lactic Acid, Venous: 3 mmol/L (ref 0.5–1.9)
Lactic Acid, Venous: 2.6 mmol/L (ref 0.5–1.9)

## 2021-11-10 MED ORDER — LATANOPROST 0.005 % OP SOLN
1.0000 [drp] | Freq: Every day | OPHTHALMIC | Status: DC
  Administered 2021-11-10 – 2021-11-16 (×7): 1 [drp] via OPHTHALMIC
  Filled 2021-11-10 (×3): qty 2.5

## 2021-11-10 MED ORDER — HEPARIN SODIUM (PORCINE) 5000 UNIT/ML IJ SOLN
5000.0000 [IU] | Freq: Three times a day (TID) | INTRAMUSCULAR | Status: DC
  Administered 2021-11-10 – 2021-11-11 (×3): 5000 [IU] via SUBCUTANEOUS
  Filled 2021-11-10 (×3): qty 1

## 2021-11-10 MED ORDER — LACTATED RINGERS IV BOLUS
2000.0000 mL | Freq: Once | INTRAVENOUS | Status: AC
  Administered 2021-11-10: 02:00:00 2000 mL via INTRAVENOUS

## 2021-11-10 MED ORDER — SODIUM CHLORIDE 0.9 % IV SOLN: 2.0000 g | INTRAVENOUS | Status: DC

## 2021-11-10 MED ORDER — LEVOTHYROXINE SODIUM 50 MCG PO TABS
50.0000 ug | ORAL_TABLET | Freq: Every day | ORAL | Status: DC
  Administered 2021-11-10 – 2021-11-17 (×8): 50 ug via ORAL
  Filled 2021-11-10 (×3): qty 1
  Filled 2021-11-10: qty 2
  Filled 2021-11-10 (×4): qty 1

## 2021-11-10 MED ORDER — ACETAMINOPHEN 650 MG RE SUPP: 650.0000 mg | Freq: Four times a day (QID) | RECTAL | Status: DC | PRN

## 2021-11-10 MED ORDER — SENNOSIDES-DOCUSATE SODIUM 8.6-50 MG PO TABS: 1.0000 | ORAL_TABLET | Freq: Every evening | ORAL | Status: DC | PRN

## 2021-11-10 MED ORDER — LACTATED RINGERS IV BOLUS
500.0000 mL | Freq: Once | INTRAVENOUS | Status: AC
  Administered 2021-11-10: 05:00:00 500 mL via INTRAVENOUS

## 2021-11-10 MED ORDER — PANTOPRAZOLE SODIUM 40 MG PO TBEC
40.0000 mg | DELAYED_RELEASE_TABLET | Freq: Every day | ORAL | Status: DC
  Administered 2021-11-10 – 2021-11-17 (×8): 40 mg via ORAL
  Filled 2021-11-10 (×9): qty 1

## 2021-11-10 MED ORDER — SODIUM CHLORIDE 0.9 % IV SOLN
2.0000 g | INTRAVENOUS | Status: DC
  Administered 2021-11-11 – 2021-11-17 (×7): 2 g via INTRAVENOUS
  Filled 2021-11-10 (×7): qty 20

## 2021-11-10 MED ORDER — LEVOTHYROXINE SODIUM 25 MCG PO TABS: 50.0000 ug | ORAL_TABLET | Freq: Every day | ORAL | Status: DC

## 2021-11-10 MED ORDER — LACTATED RINGERS IV BOLUS
500.0000 mL | Freq: Once | INTRAVENOUS | Status: AC
  Administered 2021-11-10: 07:00:00 500 mL via INTRAVENOUS

## 2021-11-10 MED ORDER — ACETAMINOPHEN 325 MG PO TABS
650.0000 mg | ORAL_TABLET | Freq: Four times a day (QID) | ORAL | Status: DC | PRN
  Administered 2021-11-12 – 2021-11-15 (×4): 650 mg via ORAL
  Filled 2021-11-10 (×4): qty 2

## 2021-11-10 MED ORDER — CLOPIDOGREL BISULFATE 75 MG PO TABS
75.0000 mg | ORAL_TABLET | Freq: Every day | ORAL | Status: DC
  Administered 2021-11-10 – 2021-11-11 (×2): 75 mg via ORAL
  Filled 2021-11-10 (×2): qty 1

## 2021-11-10 MED ORDER — LACTATED RINGERS IV SOLN: INTRAVENOUS | Status: DC

## 2021-11-10 MED ORDER — NITROGLYCERIN 0.4 MG SL SUBL: 0.4000 mg | SUBLINGUAL_TABLET | SUBLINGUAL | Status: DC | PRN

## 2021-11-10 MED ORDER — ATORVASTATIN CALCIUM 40 MG PO TABS
40.0000 mg | ORAL_TABLET | Freq: Every day | ORAL | Status: DC
  Administered 2021-11-10 – 2021-11-17 (×8): 40 mg via ORAL
  Filled 2021-11-10 (×9): qty 1

## 2021-11-10 NOTE — ED Provider Notes (Signed)
I received the patient in signout from Dr. 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  Found to have a urinary tract infection.  Plan for CT stone study and reassessment.  CT scan has resulted and does have some hydronephrosis though no stone is visible.  I discussed this with the urologist on-call, Dr. Cain Sieve, he felt the hydro was likely due to the pyelonephrosis and recommended starting him on antibiotics.  Patient's lactate is also resulted somewhat elevated at 2.5.  On my reassessment of the patient his blood pressure was in the 80s.  Code sepsis was initiated.  Given Rocephin.  Blood culture.  Discussed with medicine.  CRITICAL CARE Performed by: Cecilio Asper   Total critical care time: 35 minutes  Critical care time was exclusive of separately billable procedures and treating other patients.  Critical care was necessary to treat or prevent imminent or life-threatening deterioration.  Critical care was time spent personally by me on the following activities: development of treatment plan with patient and/or surrogate as well as nursing, discussions with consultants, evaluation of patient's response to treatment, examination of patient, obtaining history from patient or surrogate, ordering and performing treatments and interventions, ordering and review of laboratory studies, ordering and review of radiographic studies, pulse oximetry and re-evaluation of patient's condition.    Deno Etienne, DO 11/10/21 [phone]

## 2021-11-10 NOTE — Progress Notes (Signed)
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  He lives with his wife.  Patient is bedbound at baseline, lies on hospital bed but remains overall comfortable, can treat newspaper and watch TV. He reported urinary frequency, hesitancy but no report of fever or chills.  He has 24-hour caregivers at home with nurse assistants.  On presentation his blood pressure was low, lab work showed elevated lactate level, elevated white cell count, elevated creatinine.  UA was strongly suspicious for UTI.  CT abdomen/pelvis showed mild left hydroureter with associated periureteral fat stranding,mild perivesicular fat stranding. No definite ureterolithiasis but bilateral nonobstructive punctate nephrolithiasis.  Patient was admitted for the management of sepsis secondary to UTI/pyelonephritis.  Started on broad-spectrum antibiotics.  Blood culture sent.  Started on IV fluids.  Case was also discussed with urology who did not recommend any intervention /no formal consult needed. Patient was seen and examined at the bedside this morning.  Son was at the bedside.  His blood pressure was soft but stable.  MAP is more than 65.  Systolic blood pressure was in the range of 60s.  His mental status is at baseline and is not lethargic.  Examination revealed elderly deconditioned pleasant male without any acute distress.  Lungs were clear to auscultation.  There were no significant tenderness on abdominal examination.  No lower extremity edema.  His urine on canister looked little turbid but nothing alarming.  Assessment and plan:  Sepsis /septic shock secondary to urinary tract infection/pyelonephritis: Afebrile.  Presented with lower abdominal discomfort, urgency, frequency, elevated white cell count.  UA strongly  suspicious for UTI.  CT abdomen as above Continue current antibiotics, IV fluids. Follow-up cultures. Since MAP is more than 65 and patient's mental status is stable, apparently there is any urgency to consult ICU at this time.  Continue current management  AKI: Creatinine elevated from baseline.  Secondary to sepsis.  Patient creatinine around 1.9.  Continue efforts.  Monitor BMP  Chronic diastolic congestive heart failure: Takes beta-blocker at home, currently on hold.  He is looks dehydrated.  Continue fluid for now  History of CVA/debility/deconditioning: Extremely elderly patient.  Bedbound at baseline.  Does not ambulate.  Lives in hospital bed throughout the day, though confused at times, usually can communicate, reads newspapers.  Taking care of by caregivers at home.  Goals of care: Extensive discussion of goals of care done at the bedside with son.  Patient is very clear that his goal is to return to home.  He wants  anything to be done at this point.  Remains full code.  We will have less threshold to consult palliative care if we see signs of decline during this hospitalization.

## 2021-11-10 NOTE — ED Notes (Signed)
RN spoke with Chotiner MD regarding pts BP 92/57 (70) and critical lactic 3.0. MD advised 553mL LR bolus

## 2021-11-10 NOTE — H&P (Deleted)
°  Transition of Care [facility]) Screening Note   Patient Details  Name: Robert Rowe Date of Birth: 11-02-1918   [facility] Care [facility]) CM/SW Contact:    Cyndi Bender, RN Phone Number: 11/10/2021, 5:12 PM    Transition of Care Department [facility]) has reviewed patient and no TOC needs have been identified at this time. We will continue to monitor patient advancement through interdisciplinary progression rounds. If new patient transition needs arise, please place a TOC consult.

## 2021-11-10 NOTE — ED Notes (Signed)
Alcario Drought MD paged regarding pts BP 83/54 (63)

## 2021-11-10 NOTE — Sepsis Progress Note (Signed)
Elink following Code Sepsis.   Pt did receive abx before blood cultures were drawn.

## 2021-11-10 NOTE — H&P (Signed)
History and Physical    Robert Rowe EGB:[id number] DOB: December 25, 1917 DOA: 11/09/2021  PCP: [name], MD   Patient coming from: Home  Chief Complaint: Abdominal pain  HPI: Robert Rowe is a 87 y.o. male with medical history significant for hx of CVA, HTN, GERD, HFpEF, HLD, glaucoma, hypothyroidism who presents for relation of abdominal pain that began acutely yesterday evening.  Patient has sudden onset of severe abdominal pain that was mostly in the lower middle and left side of his abdomen.  Robert Rowe had no falls or trauma to his abdomen before the injury started.  Robert Rowe did not have a fever or chills.  There is no vomiting or diarrhea.  Denies any shortness of breath, chest pain, cough or dysuria.  Robert Rowe did have some urinary frequency and hesitancy yesterday afternoon.  No medications were taken at home.  Family is concerned that Robert Rowe might have significant constipation causing the pain.  Robert Rowe has had constipation in the past but never severe enough to require visit to the hospital.  Did not notice anything that made the pain more severe or worse and did not know find any alleviating factors.  Robert Rowe denies any history of kidney stones.  Robert Rowe states the pain did not radiate.  Robert Rowe lives at home and has 24-hour caregivers.  Family is very attentive.  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  Denies tobacco alcohol illicit drug use.  ED Course: Patient is found to have pyelonephritis on CT scan.  There is question may be a kidney stone in the past and ER physician discussed with urology who felt this is most likely pyelonephritis with urine that was consistent with infection.  Patient had decreasing blood pressures while in the emergency room and was therefore placed on sepsis protocol and given IV fluid bolus.  Blood pressures are responding to the IV fluid.  Worsening of his renal function on labs.  Lactic acid 2.5.  WBC 20,500 hemoglobin 14.1 hematocrit 44.3 platelets 315,000 sodium 137 potassium 4.4  chloride 101 bicarb 25 creatinine 2.47 (baseline of 1.9) BUN 35 glucose 171 lipase 36 alk phos is 86 AST 16 ALT 12 bilirubin 1.0 calcium 8.9 urinalysis positive for leukocytes with many bacteria and greater than 50 WBCs.  COVID swab is pending.  Hospitalist service been asked admit for further management  Review of Systems:  General:Denies fever, chills, weight loss, night sweats.  Denies dizziness. Denies change in appetite HENT: Denies head trauma, headache, denies change in hearing, tinnitus.  Denies nasal congestion or bleeding.  Denies sore throat.  Denies difficulty swallowing Eyes: Denies blurry vision, pain in eye, drainage.  Denies discoloration of eyes. Neck: Denies pain.  Denies swelling.  Denies pain with movement. Cardiovascular: Denies chest pain, palpitations.  Denies edema.  Denies orthopnea Respiratory: Denies shortness of breath, cough.  Denies wheezing.  Denies sputum production Gastrointestinal: Reports abdominal pain.  Denies nausea, vomiting, diarrhea.  Denies melena.  Denies hematemesis. Musculoskeletal: Denies limitation of movement.  Denies deformity or swelling. Denies arthralgias or myalgias. Genitourinary: Denies pelvic pain.  Reports urinary frequency/ hesitancy.  Denies dysuria.  Skin: Denies rash.  Denies petechiae, purpura, ecchymosis. Neurological: Denies syncope.  Denies seizure activity. Denies slurred speech, drooping face. Denies visual change. Psychiatric: Denies depression, anxiety. Denies hallucinations.  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 Echo 12/16: mild LVH, EF 55-60%, no RWMA, Gr 1 DD, mild AI, MAC, mild LAE, normal RVF, PASP 37 mmHg   Chronic kidney disease    chronic    DJD (degenerative joint disease)    Dysrhythmia     pvc's    GERD 12/03/2007   Hiatal hernia    History of nuclear stress test    Myoview 1/17: EF 53%, normal perfusion; Low Risk   HYPERLIPIDEMIA 04/05/2007   Hypothyroidism    Osteoarth NOS-Unspec 04/05/2007   Palpitations 02/12/2010   PEDAL EDEMA 05/01/2008   Rheumatoid arthritis(714.0) 04/05/2007   Shortness of breath dyspnea    TRANSIENT ISCHEMIC ATTACK, HX OF 04/05/2007   ?     Past Surgical History:  Procedure Laterality Date   BALLOON DILATION N/A 03/12/2013   Procedure: BALLOON DILATION;  Surgeon: Inda Castle, MD;  Location: [facility];  Service: Endoscopy;  Laterality: N/A;   CARPAL TUNNEL RELEASE  11/28/2018   Left hand   CATARACT EXTRACTION  2001   bilateral   CYSTOSCOPY WITH URETHRAL DILATATION N/A 09/24/2015   Procedure: CYSTOSCOPY WITH COOK BALLOON DILATATION OF URETHRAL STRICTURE ;  Surgeon: Carolan Clines, MD;  Location: [facility];  Service: Urology;  Laterality: N/A;   CYSTOSCOPY WITH URETHRAL DILATATION N/A 07/20/2016   Procedure: CYSTOSCOPY WITH URETHRAL DILATATION;  Surgeon: Alexis Frock, MD;  Location: [facility];  Service: Urology;  Laterality: N/A;  1 HOUR   ESOPHAGOGASTRODUODENOSCOPY N/A 03/12/2013   Procedure: ESOPHAGOGASTRODUODENOSCOPY (EGD);  Surgeon: Inda Castle, MD;  Location: [facility];  Service: Endoscopy;  Laterality: N/A;   HEMORRHOID SURGERY     HERNIA REPAIR     inguinal x5   KNEE SURGERY  arthroscopic   left   PROSTATE CRYOABLATION     2000   SHOULDER SURGERY  few years ago   left   TRANSURETHRAL RESECTION OF PROSTATE N/A 03/19/2015   Procedure: TRANSURETHRAL RESECTION OF THE PROSTATE (TURP);  Surgeon: Carolan Clines, MD;  Location: [facility];  Service: Urology;  Laterality: N/A;    Social History  reports that Robert Rowe quit smoking about 68 years ago. His smoking use included cigarettes. Robert Rowe has never used smokeless tobacco. Robert Rowe reports current alcohol use. Robert Rowe reports that Robert Rowe does not use drugs.  Allergies  Allergen Reactions   Codeine Other  (See Comments)    Patient was "all over the place"- undesired side effect   Sulfasalazine Other (See Comments)    Reaction not recalled   Sulfonamide Derivatives Other (See Comments)    Reaction not recalled    Family History  Problem Relation Age of Onset   Breast cancer Mother    Heart disease Father    Heart attack Father    Lymphoma Brother    Stroke Neg Hx    Hypertension Neg Hx      Prior to Admission medications   Medication Sig Start Date End Date Taking? Authorizing Provider  acetaminophen (TYLENOL) 325 MG tablet Take 650 mg by mouth at bedtime.   Yes [provider]  albuterol (VENTOLIN HFA) 108 (90 Base) MCG/ACT inhaler INHALE 2 PUFFS INTO THE LUNGS EVERY 6 HOURS AS NEEDED FOR WHEEZING (COUGH). 10/11/21  Yes Burchette, Alinda Sierras, MD  atorvastatin (LIPITOR) 40 MG tablet TAKE 1 TABLET (40 MG TOTAL) BY MOUTH DAILY AT 6 PM. Patient taking differently: Take 40 mg by mouth daily. 02/16/21  Yes Burchette, Alinda Sierras, MD  clopidogrel (PLAVIX) 75 MG tablet  TAKE 1 TABLET BY MOUTH  DAILY 09/21/21  Yes Burchette, Alinda Sierras, MD  ketoconazole (NIZORAL) 2 % cream APPLY 1 APPLICATION TOPICALLY 2 (TWO) TIMES DAILY AS NEEDED FOR IRRITATION. 06/12/20  Yes Laurey Morale, MD  latanoprost (XALATAN) 0.005 % ophthalmic solution Place 1 drop into both eyes at bedtime. For Glaucoma 02/08/18  Yes [provider]  levothyroxine (SYNTHROID) 50 MCG tablet TAKE 1 TABLET BY MOUTH  DAILY 10/18/21  Yes Burchette, Alinda Sierras, MD  nitroGLYCERIN (NITROSTAT) 0.4 MG SL tablet Place 1 tablet (0.4 mg total) under the tongue every 5 (five) minutes as needed (for Esophageal spasms). 03/22/21  Yes Burchette, Alinda Sierras, MD  pantoprazole (PROTONIX) 40 MG tablet TAKE 1 TABLET BY MOUTH EVERY DAY 04/20/21  Yes Burchette, Alinda Sierras, MD  triamcinolone (KENALOG) 0.1 % APPLY TO AFFECTED RASH TWICE DAILY AS NEEDED. Patient taking differently: Apply 1 application topically 2 (two) times daily as needed (rash). 10/14/20  Yes  Burchette, Alinda Sierras, MD  furosemide (LASIX) 20 MG tablet Take 1 tablet (20 mg total) by mouth daily as needed for fluid or edema (for increased weight of 3 pounds in one day or 5 pounds in one week). Patient not taking: Reported on 11/09/2021 04/15/21 07/14/21  [place] Eric J, DO  magnesium hydroxide (MILK OF MAGNESIA) 400 MG/5ML suspension Take 30 mLs by mouth See admin instructions. Constipation (1 of 4 ) If no BM in 3 days, give 30 cc Milk of Magnesium p.o. x 1 dose in 24 hours as needed ( Do  Not use standing constipation orders for residents with renal failure CFR less than 30. Contact MD for orders) Patient not taking: Reported on 11/09/2021    [provider]  meclizine (ANTIVERT) 12.5 MG tablet Take 1 tablet (12.5 mg total) by mouth 3 (three) times daily as needed for dizziness. Patient not taking: Reported on 11/09/2021 01/10/19   Angiulli, Lavon Paganini, PA-C  metoprolol succinate (TOPROL-XL) 25 MG 24 hr tablet TAKE 1 TABLET BY MOUTH AT  BEDTIME Patient not taking: Reported on 11/09/2021 10/25/21   [name], MD  senna-docusate (SENOKOT-S) 8.6-50 MG tablet Take 1 tablet by mouth at bedtime as needed for moderate constipation. Patient not taking: Reported on 11/09/2021 05/20/19   Georgette Shell, MD    Physical Exam: Vitals:   11/09/21 1828 11/09/21 2015 11/09/21 2149 11/09/21 2230  BP: (!) 123/59 113/77 (!) 104/59 105/70  Pulse: 97 (!) 119 (!) 115 (!) 109  Resp: (!) 26 (!) 27 (!) 26 19  Temp: (!) 97.3 F (36.3 C)     TempSrc: Oral     SpO2:  100% 100% 100%  Weight:      Height:        Constitutional: NAD, calm, comfortable Vitals:   11/09/21 1828 11/09/21 2015 11/09/21 2149 11/09/21 2230  BP: (!) 123/59 113/77 (!) 104/59 105/70  Pulse: 97 (!) 119 (!) 115 (!) 109  Resp: (!) 26 (!) 27 (!) 26 19  Temp: (!) 97.3 F (36.3 C)     TempSrc: Oral     SpO2:  100% 100% 100%  Weight:      Height:       General: WDWN, Alert and oriented x3. Elderly male Eyes: PERRL  on right. conjunctivae normal.  Sclera nonicteric. Left eye blind HENT:  [place]/AT, external ears normal.  Nares patent without epistasis.  Mucous membranes are dry. Posterior pharynx clear of any exudate or lesions. Dentures in place Neck: Soft, normal range of motion,  supple, no masses, Trachea midline Respiratory: clear to auscultation bilaterally, no wheezing, no crackles. Normal respiratory effort. No accessory muscle use.  Cardiovascular: Regular rhythm, tachycardia. no murmurs / rubs / gallops. No extremity edema. 1+ pedal pulses. Abdomen: Soft, Lower left abdominal tenderness, nondistended, no rebound or guarding. Bowel sounds normoactive Musculoskeletal:  Moves extremities spontaneously. no cyanosis. No joint deformity upper and lower extremities. no contractures. Normal muscle tone.  Skin: Warm, dry, intact no rashes, lesions, ulcers. No induration Neurologic: CN 2-12 grossly intact.  Normal speech. No tremor.  Psychiatric:   Normal mood.    Labs on Admission: I have personally reviewed following labs and imaging studies  CBC: Recent Labs  Lab 11/09/21 1908  WBC 20.5*  NEUTROABS 17.9*  HGB 14.1  HCT 44.3  MCV 100.7*  PLT 062    Basic Metabolic Panel: Recent Labs  Lab 11/09/21 1908  NA 137  K 4.4  CL 101  CO2 25  GLUCOSE 171*  BUN 35*  CREATININE 2.47*  CALCIUM 8.9    GFR: Estimated Creatinine Clearance: 13.5 mL/min (A) (by C-G formula based on SCr of 2.47 mg/dL (H)).  Liver Function Tests: Recent Labs  Lab 11/09/21 1908  AST 16  ALT 12  ALKPHOS 86  BILITOT 1.0  PROT 6.8  ALBUMIN 3.5    Urine analysis:    Component Value Date/Time   COLORURINE AMBER (A) 11/09/2021 2151   APPEARANCEUR TURBID (A) 11/09/2021 2151   LABSPEC 1.013 11/09/2021 2151   PHURINE 6.0 11/09/2021 2151   GLUCOSEU NEGATIVE 11/09/2021 2151   HGBUR LARGE (A) 11/09/2021 2151   BILIRUBINUR NEGATIVE 11/09/2021 2151   [place] NEGATIVE 11/09/2021 2151   PROTEINUR 100 (A) 11/09/2021  2151   UROBILINOGEN 0.2 08/27/2015 1930   NITRITE NEGATIVE 11/09/2021 2151   LEUKOCYTESUR LARGE (A) 11/09/2021 2151    Radiological Exams on Admission: CT ABDOMEN PELVIS WO CONTRAST  Result Date: 11/09/2021 CLINICAL DATA:  Abdominal pain, acute, nonlocalize. Sudden onset left lower quadrant and central abdominal pain. Labs will not allow dissection study initially. Rule out obstruction verse diverticulitis versus aorta versus bowel obstruction versus other EXAM: CT ABDOMEN AND PELVIS WITHOUT CONTRAST TECHNIQUE: Multidetector CT imaging of the abdomen and pelvis was performed following the standard protocol without IV contrast. COMPARISON:  CT abdomen pelvis 01/12/2019 FINDINGS: Lower chest: Bronchial wall thickening of the right lower lobe. Four-vessel coronary calcifications. Partially visualized large hiatal hernia containing the entire stomach. Hepatobiliary: No focal liver abnormality. No gallstones, gallbladder wall thickening, or pericholecystic fluid. No biliary dilatation. Pancreas: No focal lesion. Normal pancreatic contour. No surrounding inflammatory changes. No main pancreatic ductal dilatation. Spleen: Normal in size without focal abnormality. Adrenals/Urinary Tract: No adrenal nodule bilaterally. Bilateral punctate nephrolithiasis. No hydronephrosis. Fluid density lesions within the kidneys likely represent simple renal cysts. Subcentimeter hypodensities are too small to characterize. No ureterolithiasis. No right ureterolithiasis. Mild left hydroureter with associated fat stranding surrounding the ureter. Mild perivesicular fat stranding. Otherwise the urinary bladder is unremarkable. Possible TURP procedure surgical changes. Stomach/Bowel: Otherwise stomach is within normal limits. No pneumatosis. No evidence of bowel wall thickening or dilatation. Scattered colonic diverticulosis. Appendix appears normal. Vascular/Lymphatic: Distal intervened abdominal aorta measures at the upper limits of  normal (3 cm). No iliac aneurysm. Mild atherosclerotic plaque of the aorta and its branches. No abdominal, pelvic, or inguinal lymphadenopathy. Reproductive: Prominent prostate. Other: No intraperitoneal free fluid. No intraperitoneal free gas. No organized fluid collection. Musculoskeletal: No abdominal wall hernia or abnormality. Diffusely decreased bone density. No suspicious  lytic or blastic osseous lesions. Interval development of an L1 compression fracture with greater than 25% vertebral body height loss. Interval worsening of multilevel severe degenerative changes of the spine. IMPRESSION: 1. Mild left hydroureter with associated periureteral fat stranding. Mild perivesicular fat stranding. No definite ureterolithiasis. Bilateral nonobstructive punctate nephrolithiasis. Finding likely represents a recently passed stone. Differential diagnosis includes chronic changes of obstructive uropathy or reflux. Recommend correlation with urinalysis to evaluate for superimposed infection. 2. Interval development of an L1 compression fracture with greater than 25% vertebral body height loss. Correlate with point tenderness to palpation to evaluate for an acute abnormality. 3. Partially visualized large hiatal hernia containing the entire stomach. No findings of associated bowel obstruction. Other imaging findings of potential clinical significance: 1. Scattered colonic diverticulosis with no acute diverticulitis. 2. Diffuse decreased bone density with interval worsening of multilevel severe degenerative changes of the spine. 3. Distal infrarenal abdominal aorta aneurysm (3 cm). Recommend follow-up ultrasound every 3 years. This recommendation follows ACR consensus guidelines: White Paper of the ACR Incidental Findings Committee II on Vascular Findings. J Am Coll Radiol 2013; 12:878-676. 4. Aortic Atherosclerosis (ICD10-I70.0). Aortic aneurysm NOS (ICD10-I71.9). Electronically Signed   By: Iven Finn M.D.   On:  11/09/2021 23:33    EKG: Independently reviewed.  EKG shows sinus tachycardia with no acute ST elevation or depression.  QTc prolonged at 496  Assessment/Plan Principal Problem:   Pyelonephritis Robert Rowe is admitted to progressive care unit. Started on Rocephin for antibiotic coverage of pyelonephritis.  Urine culture sent to the lab and will be monitored.  IVF hydration.  Tylenol as needed for fever, mild pain.  Active Problems:   Sepsis Meets sepsis criteria with pyelonephritis, leukocytosis, tachycardia, low blood pressure, elevated lactic acid level.  Has been given IV fluid bolus in the emergency room per sepsis protocol.  Started on antibiotic with Rocephin.  Cultures obtained in the emergency room and will be monitored.  Antibiotics will be adjusted if indicated by culture results    Acute kidney injury superimposed on CKD 3B Pt is given IVF bolus in ER for sepsis. Continue LR at 100 ml/hr overnight. Monitor fluid status. Recheck renal function and electrolytes in am If creatinine is not improving by morning would obtain ultrasound of kidneys for further evaluation    Chronic diastolic CHF (congestive heart failure)  Monitor I&O's, daily weights.  Gentle IV fluid hydration with sepsis with soft blood pressure provided.  Monitor Fluid status. Hold beta blocker with BP on low end.     Prolonged QT interval Avoid medications which could further prolong QT interval.  Monitor on telemetry    History of CVA (cerebrovascular accident)   DVT prophylaxis: Heparin for DVT prophylaxis Code Status:   Full code.  CODE STATUS discussed with patient and his family Family Communication:  Diagnosis and plan discussed with patient and family.  They verbalized understanding agree with plan.  Questions answered.  Further recommendation follow as clinical indicated Disposition Plan:   Patient is from:  Home  Anticipated DC to:  Home  Anticipated DC date:  Anticipate 2 midnight or longer stay  to treat acute condition  Time spent on admission:       75 minutes, includes examination of patient, discussion with family, reviewing labs, imaging and chart, writing orders and H&P.   Admission status:  Inpatient   Yevonne Aline Shawntrice Salle MD [facility]  How to contact the [facility] Attending or Consulting provider [place] or covering provider during after  hours 7P -7A, for this patient?   Check the care team in [facility] and look for a) attending/consulting [facility] provider listed and b) the [facility] team listed Log into [email or web address] and use 's universal password to access. If you do not have the password, please contact the hospital operator. Locate the [facility] provider you are looking for under [facility] and page to a number that you can be directly reached. If you still have difficulty reaching the provider, please page the [facility] (Director on Call) for the Hospitalists listed on amion for assistance.  11/10/2021, 12:48 AM

## 2021-11-11 ENCOUNTER — Inpatient Hospital Stay (HOSPITAL_COMMUNITY): Payer: Medicare Other

## 2021-11-11 ENCOUNTER — Other Ambulatory Visit (HOSPITAL_COMMUNITY): Payer: Self-pay

## 2021-11-11 DIAGNOSIS — I214 Non-ST elevation (NSTEMI) myocardial infarction: Secondary | ICD-10-CM

## 2021-11-11 DIAGNOSIS — N12 Tubulo-interstitial nephritis, not specified as acute or chronic: Secondary | ICD-10-CM | POA: Diagnosis not present

## 2021-11-11 LAB — TROPONIN I (HIGH SENSITIVITY): Troponin I (High Sensitivity): 760 ng/L (ref <18)

## 2021-11-11 LAB — CBC WITH DIFFERENTIAL/PLATELET
Abs Immature Granulocytes: 0.05 10*3/uL (ref 0.00–0.07)
Basophils Absolute: 0.1 10*3/uL (ref 0.0–0.1)
Basophils Relative: 1 %
Eosinophils Absolute: 0.1 10*3/uL (ref 0.0–0.5)
Eosinophils Relative: 1 %
HCT: 31.4 % — ABNORMAL LOW (ref 39.0–52.0)
Hemoglobin: 10.3 g/dL — ABNORMAL LOW (ref 13.0–17.0)
Immature Granulocytes: 0 %
Lymphocytes Relative: 16 %
Lymphs Abs: 2 10*3/uL (ref 0.7–4.0)
MCH: 32.5 pg (ref 26.0–34.0)
MCHC: 32.8 g/dL (ref 30.0–36.0)
MCV: 99.1 fL (ref 80.0–100.0)
Monocytes Absolute: 0.9 10*3/uL (ref 0.1–1.0)
Monocytes Relative: 7 %
Neutro Abs: 9.1 10*3/uL — ABNORMAL HIGH (ref 1.7–7.7)
Neutrophils Relative %: 75 %
Platelets: 206 10*3/uL (ref 150–400)
RBC: 3.17 MIL/uL — ABNORMAL LOW (ref 4.22–5.81)
RDW: 13.8 % (ref 11.5–15.5)
WBC: 12.1 10*3/uL — ABNORMAL HIGH (ref 4.0–10.5)
nRBC: 0 % (ref 0.0–0.2)

## 2021-11-11 LAB — BASIC METABOLIC PANEL
Anion gap: 8 (ref 5–15)
BUN: 30 mg/dL — ABNORMAL HIGH (ref 8–23)
CO2: 22 mmol/L (ref 22–32)
Calcium: 8 mg/dL — ABNORMAL LOW (ref 8.9–10.3)
Chloride: 107 mmol/L (ref 98–111)
Creatinine, Ser: 2.07 mg/dL — ABNORMAL HIGH (ref 0.61–1.24)
GFR, Estimated: 28 mL/min — ABNORMAL LOW (ref >60)
Glucose, Bld: 126 mg/dL — ABNORMAL HIGH (ref 70–99)
Potassium: 3.7 mmol/L (ref 3.5–5.1)
Sodium: 137 mmol/L (ref 135–145)

## 2021-11-11 LAB — URINE CULTURE

## 2021-11-11 LAB — ECHOCARDIOGRAM COMPLETE
Area-P 1/2: 3.65 cm2
Calc EF: 39.1 %
Height: 65 in
MV M vel: 4.43 m/s
MV Peak grad: 78.5 mmHg
Radius: 0.3 cm
S' Lateral: 3.5 cm
Single Plane A2C EF: 35.6 %
Single Plane A4C EF: 43.8 %
Weight: 2469.15 oz

## 2021-11-11 LAB — TSH: TSH: 3.305 u[IU]/mL (ref 0.350–4.500)

## 2021-11-11 LAB — APTT: aPTT: 43 seconds — ABNORMAL HIGH (ref 24–36)

## 2021-11-11 LAB — LACTIC ACID, PLASMA: Lactic Acid, Venous: 1.2 mmol/L (ref 0.5–1.9)

## 2021-11-11 LAB — BRAIN NATRIURETIC PEPTIDE: B Natriuretic Peptide: 532.2 pg/mL — ABNORMAL HIGH (ref 0.0–100.0)

## 2021-11-11 MED ORDER — ALBUTEROL SULFATE (2.5 MG/3ML) 0.083% IN NEBU
2.5000 mg | INHALATION_SOLUTION | Freq: Once | RESPIRATORY_TRACT | Status: AC
  Administered 2021-11-11: 14:00:00 2.5 mg via RESPIRATORY_TRACT
  Filled 2021-11-11: qty 3

## 2021-11-11 MED ORDER — HEPARIN (PORCINE) 25000 UT/250ML-% IV SOLN
900.0000 [IU]/h | INTRAVENOUS | Status: AC
  Administered 2021-11-11 – 2021-11-12 (×2): 900 [IU]/h via INTRAVENOUS
  Filled 2021-11-11 (×2): qty 250

## 2021-11-11 MED ORDER — FUROSEMIDE 10 MG/ML IJ SOLN
20.0000 mg | Freq: Once | INTRAMUSCULAR | Status: AC
  Administered 2021-11-11: 13:00:00 20 mg via INTRAVENOUS
  Filled 2021-11-11: qty 2

## 2021-11-11 MED ORDER — MAGNESIUM SULFATE 2 GM/50ML IV SOLN
2.0000 g | Freq: Once | INTRAVENOUS | Status: AC
  Administered 2021-11-11: 08:00:00 2 g via INTRAVENOUS
  Filled 2021-11-11: qty 50

## 2021-11-11 MED ORDER — AMIODARONE HCL IN DEXTROSE 360-4.14 MG/200ML-% IV SOLN
60.0000 mg/h | INTRAVENOUS | Status: AC
  Administered 2021-11-11: 11:00:00 60 mg/h via INTRAVENOUS
  Filled 2021-11-11 (×3): qty 200

## 2021-11-11 MED ORDER — MIDODRINE HCL 5 MG PO TABS
10.0000 mg | ORAL_TABLET | Freq: Three times a day (TID) | ORAL | Status: DC
  Administered 2021-11-11 – 2021-11-17 (×19): 10 mg via ORAL
  Filled 2021-11-11 (×20): qty 2

## 2021-11-11 MED ORDER — ALBUTEROL SULFATE (2.5 MG/3ML) 0.083% IN NEBU
2.5000 mg | INHALATION_SOLUTION | RESPIRATORY_TRACT | Status: DC | PRN
  Administered 2021-11-11 – 2021-11-13 (×3): 2.5 mg via RESPIRATORY_TRACT
  Filled 2021-11-11 (×3): qty 3

## 2021-11-11 MED ORDER — AMIODARONE HCL IN DEXTROSE 360-4.14 MG/200ML-% IV SOLN
30.0000 mg/h | INTRAVENOUS | Status: DC
  Administered 2021-11-11: 14:00:00 30 mg/h via INTRAVENOUS
  Filled 2021-11-11 (×3): qty 200

## 2021-11-11 MED ORDER — LACTATED RINGERS IV BOLUS
250.0000 mL | Freq: Once | INTRAVENOUS | Status: AC
  Administered 2021-11-11: 250 mL via INTRAVENOUS

## 2021-11-11 MED ORDER — FUROSEMIDE 10 MG/ML IJ SOLN
40.0000 mg | Freq: Once | INTRAMUSCULAR | Status: AC
  Administered 2021-11-11: 18:00:00 40 mg via INTRAVENOUS
  Filled 2021-11-11: qty 4

## 2021-11-11 MED ORDER — LACTATED RINGERS IV BOLUS
500.0000 mL | Freq: Once | INTRAVENOUS | Status: AC
  Administered 2021-11-11: 09:00:00 500 mL via INTRAVENOUS

## 2021-11-11 MED ORDER — AMIODARONE LOAD VIA INFUSION
150.0000 mg | Freq: Once | INTRAVENOUS | Status: AC
  Administered 2021-11-11: 08:00:00 150 mg via INTRAVENOUS
  Filled 2021-11-11: qty 83.34

## 2021-11-11 MED ORDER — HEPARIN BOLUS VIA INFUSION
4000.0000 [IU] | Freq: Once | INTRAVENOUS | Status: AC
  Administered 2021-11-11: 15:00:00 4000 [IU] via INTRAVENOUS
  Filled 2021-11-11: qty 4000

## 2021-11-11 MED ORDER — PERFLUTREN LIPID MICROSPHERE
1.0000 mL | INTRAVENOUS | Status: AC | PRN
Start: 2021-11-11 — End: 2021-11-11
  Administered 2021-11-11: 14:00:00 3 mL via INTRAVENOUS
  Filled 2021-11-11: qty 10

## 2021-11-11 MED ORDER — POTASSIUM CHLORIDE CRYS ER 20 MEQ PO TBCR
40.0000 meq | EXTENDED_RELEASE_TABLET | Freq: Once | ORAL | Status: AC
  Administered 2021-11-11: 08:00:00 40 meq via ORAL
  Filled 2021-11-11: qty 2

## 2021-11-11 NOTE — Progress Notes (Signed)
ANTICOAGULATION CONSULT NOTE - Initial Consult  Pharmacy Consult for heparin Indication: atrial fibrillation  Allergies  Allergen Reactions   Codeine Other (See Comments)    Patient was "all over the place"- undesired side effect   Sulfasalazine Other (See Comments)    Reaction not recalled   Sulfonamide Derivatives Other (See Comments)    Reaction not recalled    Patient Measurements: Height: _0  (165.1 cm) Weight: 70 kg (154 lb 5.2 oz) IBW/kg (Calculated) : 61.5 Heparin Dosing Weight: 72.6 kg  Vital Signs: Temp: 97.5 F (36.4 C) (12/22 1224) Temp Source: Oral (12/22 1224) BP: 99/45 (12/22 1224) Pulse Rate: 85 (12/22 1224)  Labs: Recent Labs    11/10/21 0830 11/10/21 1200 11/11/21 0536 11/11/21 0951  HGB 10.8* 10.5* 10.3*  --   HCT 33.4* 34.0* 31.4*  --   PLT 235 221 206  --   CREATININE 2.26* 2.30* 2.07*  --   TROPONINIHS  --   --   --  760*    Estimated Creatinine Clearance: 16.1 mL/min (A) (by C-G formula based on SCr of 2.07 mg/dL (H)).  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 Echo 12/16: mild LVH, EF 55-60%, no RWMA, Gr 1 DD, mild AI, MAC, mild LAE, normal RVF, PASP 37 mmHg   Chronic kidney disease    chronic    DJD (degenerative joint disease)    Dysrhythmia    pvc's    GERD 12/03/2007   Hiatal hernia    History of nuclear stress test    Myoview 1/17: EF 53%, normal perfusion; Low Risk   HYPERLIPIDEMIA 04/05/2007   Hypothyroidism    Osteoarth NOS-Unspec 04/05/2007   Palpitations 02/12/2010   PEDAL EDEMA 05/01/2008   Rheumatoid arthritis(714.0) 04/05/2007   Shortness of breath dyspnea    TRANSIENT ISCHEMIC ATTACK, HX OF 04/05/2007   ?     Medications: see MAR  Assessment: 87 yo M with A fib RVR in setting of  NSTEMI. Not on anticoagulation prior to admission. Pharmacy consulted for heparin dosing for 48 hours.  CBC stable - Hgb 10.3 (bsl 12-14), Plt 206.   Goal of Therapy:  Heparin level 0.3-0.7 units/ml Monitor platelets by anticoagulation protocol: Yes   Plan:  Give heparin bolus 4000 units x1 Start heparin infusion 900 units/hr  8 hour heparin level Daily CBC, heparin level Monitor for s/sx of bleeding F/U transition to Eliquis 12/24 _1   Laurey Arrow, PharmD PGY1 Pharmacy Resident 11/11/2021  1:31 PM  Please check [email or web address] for unit-specific pharmacy phone numbers.

## 2021-11-11 NOTE — Significant Event (Addendum)
BP 83/64, pt denies symptoms.  Lungs clear on my exam at the moment.  Pt net negative 1300 today after lasix.  Will give 250cc LR bolus to see how he responds.  If IVF not effective, may need to give another dose of midodrine.  Update at [facility]: Bolus finishing up, BP 111/55  Update 0042: BP 96/60

## 2021-11-11 NOTE — Progress Notes (Signed)
PROGRESS NOTE                                                                                                                                                                                                             Patient Demographics:    Robert Rowe, is a 87 y.o. 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 lower abdominal discomfort in the hospital he was diagnosed with pyelonephritis on the left side due to a past left ureteric stone.  He also had sepsis and subsequently went into A. fib RVR.  He came into my care early morning 11/11/2021 at 7 AM.   Subjective:    Robert Rowe today has, No headache, No chest pain, No abdominal pain - No Nausea, No new weakness tingling or numbness, mild SOB.   Assessment  & Plan :     Sepsis due to left-sided pyelonephritis caused by a passed left ureteral stone - he been placed on IV fluids along with empiric IV antibiotics, cultures so far negative, sepsis pathophysiology is improving, continue antibiotics stone likely has passed as per CT scan.  2.  Paroxysmal A. fib RVR with NSTEMI.  He had sustained tachycardia night of 11/10/2021 due to A. fib RVR, he was also hypotensive, when I came to see him this morning he was profoundly hypotensive, tachycardic and had wheezes and crackles.   I think due to sustained tachycardia he has had NSTEMI, troponin is elevated and so his BNP, unfortunately he is in RVR with hypotension.  I have started him on amiodarone along with midodrine to augment  his blood pressure, IV fluids as tolerated, one-time gentle Lasix to clear his pulmonary edema, unable to give beta-blocker due to hypotension, continue statin.  Limited options due to his sepsis and advanced age.  Will get a stat echocardiogram.  Overall extremely tenuous.  This could be life-threatening.  Explained to the family in detail.  His [place] vas  2 score will be greater than 5 in the light of NSTEMI will give heparin drip for 48 hours.    3. HX of stroke with right-sided weakness.  We will stop Plavix and switch him to heparin drip, once he is better and close to discharge on Eliquis.  4.  Dyslipidemia.  On statin.  5.  Acute on chronic CHF.  See #2 above.  Echo being done.  Would be interested in evaluating for wall motion and particularly his RV.  6.  GERD.  PPI.  7.  Hypothyroidism.  On Synthroid.  Stable TSH.  8.  Mildly prolonged QTC.  Give magnesium monitor electrolytes.  When blood pressure improves low-dose beta-blocker.    Overall extremely tenuous due to sepsis, pyelonephritis, A. fib RVR and NSTEMI with hypotension.  Continue medical treatment, DNR, if declines further then comfort measures.       Condition - Extremely Guarded  Family Communication  : Son and wife bedside on 11/11/2021  Code Status : DNR  Consults  : Palliative care  PUD Prophylaxis : PPI   Procedures  :     CT - 1. Mild left hydroureter with associated periureteral fat stranding. Mild perivesicular fat stranding. No definite ureterolithiasis. Bilateral nonobstructive punctate nephrolithiasis. Finding likely represents a recently passed stone. Differential diagnosis includes chronic changes of obstructive uropathy or reflux. Recommend correlation with urinalysis to evaluate for superimposed infection. 2. Interval development of an L1 compression fracture with greater than 25% vertebral body height loss. Correlate with point tenderness to palpation to evaluate for an acute abnormality. 3.  Partially visualized large hiatal hernia containing the entire stomach. No findings of associated bowel obstruction. Other imaging findings of potential clinical significance: 1. Scattered colonic diverticulosis with no acute diverticulitis. 2. Diffuse decreased bone density with interval worsening of multilevel severe degenerative changes of the spine. 3. Distal infrarenal abdominal aorta aneurysm (3 cm). Recommend follow-up ultrasound every 3 years. This recommendation follows ACR consensus guidelines: White Paper of the ACR Incidental Findings Committee II on Vascular Findings.  TTE      Disposition Plan  :    Status is: Inpatient  Remains inpatient appropriate because: Pyelonephritis, NSTEMI, A. fib RVR   DVT Prophylaxis  :    heparin injection 5,000 Units Start: 11/10/21 1400    Lab Results  Component Value Date   PLT 206 11/11/2021    Diet :  Diet Order             Diet Heart Room service appropriate? Yes; Fluid consistency: Thin  Diet effective now                    Inpatient Medications  Scheduled Meds:  albuterol  2.5 mg Nebulization Once   atorvastatin  40 mg Oral Daily   clopidogrel  75 mg Oral Daily   fentaNYL (SUBLIMAZE) injection  50 mcg Intravenous Once   heparin  5,000 Units Subcutaneous Q8H   latanoprost  1 drop Both Eyes QHS   levothyroxine  50 mcg Oral Q0600   midodrine  10 mg Oral TID WC   pantoprazole  40 mg Oral Daily   Continuous Infusions:  amiodarone 60 mg/hr (11/11/21 1057)   Followed by   amiodarone     cefTRIAXone (ROCEPHIN)  IV 2 g (11/11/21 0247)   PRN Meds:.acetaminophen **OR** acetaminophen, albuterol, nitroGLYCERIN, senna-docusate  Antibiotics  :    Anti-infectives (From admission, onward)    Start     Dose/Rate Route Frequency Ordered Stop  11/11/21 0200  cefTRIAXone (ROCEPHIN) 2 g in sodium chloride 0.9 % 100 mL IVPB        2 g 200 mL/hr over 30 Minutes Intravenous Every 24 hours 11/10/21 0814     11/10/21 1145   cefTRIAXone (ROCEPHIN) 2 g in sodium chloride 0.9 % 100 mL IVPB  Status:  Discontinued        2 g 200 mL/hr over 30 Minutes Intravenous Every 24 hours 11/10/21 1138 11/10/21 1145   11/10/21 0000  cefTRIAXone (ROCEPHIN) 2 g in sodium chloride 0.9 % 100 mL IVPB        2 g 200 mL/hr over 30 Minutes Intravenous  Once 11/09/21 2354 11/10/21 0115        Time Spent in minutes  30   Lala Lund M.D on 11/11/2021 at 1:13 PM  To page go to [email or web address]   [facility] -  Office  [phone]  See all Orders from today for further details    Objective:   Vitals:   11/11/21 0751 11/11/21 0842 11/11/21 1200 11/11/21 1224  BP: 103/76 (!) 81/57 (!) 78/54 (!) 99/45  Pulse: (!) 144 86 84 85  Resp: (!) 22 20 20 20   Temp:  98.1 F (36.7 C)  (!) 97.5 F (36.4 C)  TempSrc:  Oral  Oral  SpO2: 99% 100% 100% 100%  Weight:      Height:        Wt Readings from Last 3 Encounters:  11/11/21 70 kg  04/15/21 66.4 kg  09/11/20 63.5 kg     Intake/Output Summary (Last 24 hours) at 11/11/2021 1313 Last data filed at 11/11/2021 0915 Gross per 24 hour  Intake 507.6 ml  Output 450 ml  Net 57.6 ml     Physical Exam  Awake Alert, No new F.N deficits, chronic right-sided weakness, [place].AT,PERRAL Supple Neck, No JVD,   Symmetrical Chest wall movement, Good air movement bilaterally, ++ rales iRRR,No Gallops,Rubs or new Murmurs,  +ve B.Sounds, Abd Soft, No tenderness,   No Cyanosis, Clubbing or edema        Data Review:    CBC Recent Labs  Lab 11/09/21 1908 11/10/21 0830 11/10/21 1200 11/11/21 0536  WBC 20.5* 18.0* 16.8* 12.1*  HGB 14.1 10.8* 10.5* 10.3*  HCT 44.3 33.4* 34.0* 31.4*  PLT 315 235 221 206  MCV 100.7* 101.2* 102.4* 99.1  MCH 32.0 32.7 31.6 32.5  MCHC 31.8 32.3 30.9 32.8  RDW 13.6 13.7 13.7 13.8  LYMPHSABS 1.3 2.0  --  2.0  MONOABS 1.2* 1.5*  --  0.9  EOSABS 0.0 0.0  --  0.1  BASOSABS 0.1 0.1  --  0.1    Electrolytes Recent Labs  Lab 11/09/21 1908  11/09/21 2047 11/10/21 0038 11/10/21 0343 11/10/21 0641 11/10/21 0830 11/10/21 1200 11/11/21 0536 11/11/21 0951  NA 137  --   --   --   --  136 138 137  --   K 4.4  --   --   --   --  4.2 4.3 3.7  --   CL 101  --   --   --   --  104 107 107  --   CO2 25  --   --   --   --  24 24 22   --   GLUCOSE 171*  --   --   --   --  134* 133* 126*  --   BUN 35*  --   --   --   --  35* 33* 30*  --   CREATININE 2.47*  --   --   --   --  2.26* 2.30* 2.07*  --   CALCIUM 8.9  --   --   --   --  8.2* 8.3* 8.0*  --   AST 16  --   --   --   --   --  14*  --   --   ALT 12  --   --   --   --   --  10  --   --   ALKPHOS 86  --   --   --   --   --  59  --   --   BILITOT 1.0  --   --   --   --   --  0.7  --   --   ALBUMIN 3.5  --   --   --   --   --  2.5*  --   --   LATICACIDVEN  --  2.5* 2.5* 3.0* 2.6*  --   --  1.2  --   TSH  --   --   --   --   --   --   --   --  3.305  BNP  --   --   --   --   --   --   --   --  532.2*    ------------------------------------------------------------------------------------------------------------------ No results for input(s): CHOL, HDL, LDLCALC, TRIG, CHOLHDL, LDLDIRECT in the last 72 hours.  Lab Results  Component Value Date   HGBA1C 6.0 (H) 05/13/2019    Recent Labs    11/11/21 0951  TSH 3.305   ------------------------------------------------------------------------------------------------------------------ ID Labs Recent Labs  Lab 11/09/21 1908 11/09/21 2047 11/10/21 0038 11/10/21 0343 11/10/21 0641 11/10/21 0830 11/10/21 1200 11/11/21 0536  WBC 20.5*  --   --   --   --  18.0* 16.8* 12.1*  PLT 315  --   --   --   --  235 221 206  LATICACIDVEN  --  2.5* 2.5* 3.0* 2.6*  --   --  1.2  CREATININE 2.47*  --   --   --   --  2.26* 2.30* 2.07*       Micro Results Recent Results (from the past 240 hour(s))  Urine Culture     Status: Abnormal   Collection Time: 11/09/21  7:10 PM   Specimen: Urine, Clean Catch  Result Value Ref Range Status    Specimen Description URINE, CLEAN CATCH  Final   Special Requests   Final    NONE Performed at [facility], [place] [address] [place], [place] [address]    Culture MULTIPLE SPECIES PRESENT, SUGGEST RECOLLECTION (A)  Final   Report Status 11/11/2021 FINAL  Final  Culture, blood (single)     Status: None (Preliminary result)   Collection Time: 11/10/21 12:13 AM   Specimen: BLOOD  Result Value Ref Range Status   Specimen Description BLOOD RIGHT ANTECUBITAL  Final   Special Requests   Final    BOTTLES DRAWN AEROBIC AND ANAEROBIC Blood Culture results may not be optimal due to an inadequate volume of blood received in culture bottles   Culture   Final    NO GROWTH 1 DAY Performed at [facility], [place] [address] [place], [place] [address]    Report Status PENDING  Incomplete  Culture, blood (routine x 2)     Status:  None (Preliminary result)   Collection Time: 11/10/21  8:30 AM   Specimen: BLOOD  Result Value Ref Range Status   Specimen Description BLOOD LEFT ANTECUBITAL  Final   Special Requests   Final    BOTTLES DRAWN AEROBIC AND ANAEROBIC Blood Culture results may not be optimal due to an inadequate volume of blood received in culture bottles   Culture   Final    NO GROWTH < 24 HOURS Performed at [facility], [place] [address] [place], [place] [address]    Report Status PENDING  Incomplete  Culture, blood (routine x 2)     Status: None (Preliminary result)   Collection Time: 11/10/21  8:46 AM   Specimen: BLOOD  Result Value Ref Range Status   Specimen Description BLOOD RIGHT ANTECUBITAL  Final   Special Requests   Final    BOTTLES DRAWN AEROBIC AND ANAEROBIC Blood Culture adequate volume   Culture   Final    NO GROWTH < 24 HOURS Performed at [facility], [place] [address], [place], [place] [address]    Report Status PENDING  Incomplete    Radiology Reports CT ABDOMEN PELVIS WO CONTRAST  Result Date: 11/09/2021 CLINICAL DATA:  Abdominal pain, acute,  nonlocalize. Sudden onset left lower quadrant and central abdominal pain. Labs will not allow dissection study initially. Rule out obstruction verse diverticulitis versus aorta versus bowel obstruction versus other EXAM: CT ABDOMEN AND PELVIS WITHOUT CONTRAST TECHNIQUE: Multidetector CT imaging of the abdomen and pelvis was performed following the standard protocol without IV contrast. COMPARISON:  CT abdomen pelvis 01/12/2019 FINDINGS: Lower chest: Bronchial wall thickening of the right lower lobe. Four-vessel coronary calcifications. Partially visualized large hiatal hernia containing the entire stomach. Hepatobiliary: No focal liver abnormality. No gallstones, gallbladder wall thickening, or pericholecystic fluid. No biliary dilatation. Pancreas: No focal lesion. Normal pancreatic contour. No surrounding inflammatory changes. No main pancreatic ductal dilatation. Spleen: Normal in size without focal abnormality. Adrenals/Urinary Tract: No adrenal nodule bilaterally. Bilateral punctate nephrolithiasis. No hydronephrosis. Fluid density lesions within the kidneys likely represent simple renal cysts. Subcentimeter hypodensities are too small to characterize. No ureterolithiasis. No right ureterolithiasis. Mild left hydroureter with associated fat stranding surrounding the ureter. Mild perivesicular fat stranding. Otherwise the urinary bladder is unremarkable. Possible TURP procedure surgical changes. Stomach/Bowel: Otherwise stomach is within normal limits. No pneumatosis. No evidence of bowel wall thickening or dilatation. Scattered colonic diverticulosis. Appendix appears normal. Vascular/Lymphatic: Distal intervened abdominal aorta measures at the upper limits of normal (3 cm). No iliac aneurysm. Mild atherosclerotic plaque of the aorta and its branches. No abdominal, pelvic, or inguinal lymphadenopathy. Reproductive: Prominent prostate. Other: No intraperitoneal free fluid. No intraperitoneal free gas. No  organized fluid collection. Musculoskeletal: No abdominal wall hernia or abnormality. Diffusely decreased bone density. No suspicious lytic or blastic osseous lesions. Interval development of an L1 compression fracture with greater than 25% vertebral body height loss. Interval worsening of multilevel severe degenerative changes of the spine. IMPRESSION: 1. Mild left hydroureter with associated periureteral fat stranding. Mild perivesicular fat stranding. No definite ureterolithiasis. Bilateral nonobstructive punctate nephrolithiasis. Finding likely represents a recently passed stone. Differential diagnosis includes chronic changes of obstructive uropathy or reflux. Recommend correlation with urinalysis to evaluate for superimposed infection. 2. Interval development of an L1 compression fracture with greater than 25% vertebral body height loss. Correlate with point tenderness to palpation to evaluate for an acute abnormality. 3. Partially visualized large hiatal hernia containing the entire stomach. No findings of associated bowel  obstruction. Other imaging findings of potential clinical significance: 1. Scattered colonic diverticulosis with no acute diverticulitis. 2. Diffuse decreased bone density with interval worsening of multilevel severe degenerative changes of the spine. 3. Distal infrarenal abdominal aorta aneurysm (3 cm). Recommend follow-up ultrasound every 3 years. This recommendation follows ACR consensus guidelines: White Paper of the ACR Incidental Findings Committee II on Vascular Findings. J Am Coll Radiol 2013; 35:329-924. 4. Aortic Atherosclerosis (ICD10-I70.0). Aortic aneurysm NOS (ICD10-I71.9). Electronically Signed   By: Iven Finn M.D.   On: 11/09/2021 23:33   DG Chest Port 1 View  Result Date: 11/11/2021 CLINICAL DATA:  Shortness of breath EXAM: PORTABLE CHEST 1 VIEW COMPARISON:  04/13/2021 FINDINGS: Heart is normal size. Bibasilar airspace opacities are similar prior study. Hiatal  hernia noted. No effusions or acute bony abnormality. IMPRESSION: Chronic bibasilar opacities, similar prior study. Favor scarring or atelectasis. Infiltrate cannot be completely excluded at the left lung base. Moderate-sized hiatal hernia. Electronically Signed   By: Rolm Baptise M.D.   On: 11/11/2021 10:02

## 2021-11-11 NOTE — Progress Notes (Signed)
Patient having frequent runs of A flutter/Sinus tachycardia 130-140's, EKG obtained. Remains asymptomatic, denies chest pain. Patients orientation is at baseline.   VITALS BP 89/66 (76), HR 136, RR 20, Temp 98   Gardner, MD at bedside.

## 2021-11-11 NOTE — Progress Notes (Signed)
°  Transition of Care [facility]) Screening Note   Patient Details  Name: Robert Rowe Date of Birth: January 13, 1918   Transition [facility]) CM/SW Contact:    Cyndi Bender, RN Phone Number: 11/11/2021, 9:06 AM    Transition of Care Department [facility]) has reviewed patient and no TOC needs have been identified at this time. We will continue to monitor patient advancement through interdisciplinary progression rounds. If new patient transition needs arise, please place a TOC consult.

## 2021-11-11 NOTE — Progress Notes (Signed)
Critical lab rec'd and relayed to Dr. Candiss Norse  Troponin level 760

## 2021-11-11 NOTE — Significant Event (Signed)
Pt having new onset A.Fib / flutter with rates 120-140s.  Despite this pt is completely asymptomatic, denies: CP, SOB.  BP 89/66 (from sepsis from pyelonephritis) complicating matters.  Discussed with Dr. Alveta Heimlich with cards: 1) could consider 150mg  amiodarone bolus then gtt for partial rate control as long as no QT prolongation. Unfortunately Pt does have QTc of 488 on EKG this evening(had about 500 on admission).  This does look to me like a real QTc measurement (T wave does NOT compete before half the distance R-R). 2) could consider small dose of BB PO since this might actually increase BP. 3) may end up just having to tolerate higher HR in setting of sepsis.  Discussion: At this point pt is denying any symptoms, resting comfortably.  Not sure that anything I give him, wont make him worse. Im going to hold off on starting anything for the moment unless he becomes symptomatic.  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  Consider formal cards consult in AM.

## 2021-11-11 NOTE — TOC Benefit Eligibility Note (Signed)
Patient Teacher, English as a foreign language completed.    The patient is currently admitted and upon discharge could be taking Eliquis 5 mg.  The current 30 day co-pay is, $121.07 due to a $74.07 deductible remaining.   The patient is insured through [employer] [place] Patient Advocate Specialist [place] Patient Advocate Team Direct Number: [phone]  Fax: [phone]

## 2021-11-12 ENCOUNTER — Inpatient Hospital Stay (HOSPITAL_COMMUNITY): Payer: Medicare Other

## 2021-11-12 DIAGNOSIS — N12 Tubulo-interstitial nephritis, not specified as acute or chronic: Secondary | ICD-10-CM | POA: Diagnosis not present

## 2021-11-12 LAB — CBC WITH DIFFERENTIAL/PLATELET
Abs Immature Granulocytes: 0.05 10*3/uL (ref 0.00–0.07)
Basophils Absolute: 0.1 10*3/uL (ref 0.0–0.1)
Basophils Relative: 1 %
Eosinophils Absolute: 0.1 10*3/uL (ref 0.0–0.5)
Eosinophils Relative: 1 %
HCT: 30.3 % — ABNORMAL LOW (ref 39.0–52.0)
Hemoglobin: 9.9 g/dL — ABNORMAL LOW (ref 13.0–17.0)
Immature Granulocytes: 0 %
Lymphocytes Relative: 17 %
Lymphs Abs: 2.1 10*3/uL (ref 0.7–4.0)
MCH: 32.1 pg (ref 26.0–34.0)
MCHC: 32.7 g/dL (ref 30.0–36.0)
MCV: 98.4 fL (ref 80.0–100.0)
Monocytes Absolute: 1 10*3/uL (ref 0.1–1.0)
Monocytes Relative: 8 %
Neutro Abs: 9.1 10*3/uL — ABNORMAL HIGH (ref 1.7–7.7)
Neutrophils Relative %: 73 %
Platelets: 223 10*3/uL (ref 150–400)
RBC: 3.08 MIL/uL — ABNORMAL LOW (ref 4.22–5.81)
RDW: 14 % (ref 11.5–15.5)
WBC: 12.4 10*3/uL — ABNORMAL HIGH (ref 4.0–10.5)
nRBC: 0 % (ref 0.0–0.2)

## 2021-11-12 LAB — COMPREHENSIVE METABOLIC PANEL
ALT: 12 U/L (ref 0–44)
AST: 21 U/L (ref 15–41)
Albumin: 2.4 g/dL — ABNORMAL LOW (ref 3.5–5.0)
Alkaline Phosphatase: 52 U/L (ref 38–126)
Anion gap: 10 (ref 5–15)
BUN: 28 mg/dL — ABNORMAL HIGH (ref 8–23)
CO2: 23 mmol/L (ref 22–32)
Calcium: 8.2 mg/dL — ABNORMAL LOW (ref 8.9–10.3)
Chloride: 105 mmol/L (ref 98–111)
Creatinine, Ser: 2.17 mg/dL — ABNORMAL HIGH (ref 0.61–1.24)
GFR, Estimated: 26 mL/min — ABNORMAL LOW (ref >60)
Glucose, Bld: 132 mg/dL — ABNORMAL HIGH (ref 70–99)
Potassium: 4 mmol/L (ref 3.5–5.1)
Sodium: 138 mmol/L (ref 135–145)
Total Bilirubin: 0.5 mg/dL (ref 0.3–1.2)
Total Protein: 5 g/dL — ABNORMAL LOW (ref 6.5–8.1)

## 2021-11-12 LAB — MAGNESIUM: Magnesium: 1.9 mg/dL (ref 1.7–2.4)

## 2021-11-12 LAB — HEPARIN LEVEL (UNFRACTIONATED)
Heparin Unfractionated: 0.69 IU/mL (ref 0.30–0.70)
Heparin Unfractionated: 0.6 IU/mL (ref 0.30–0.70)

## 2021-11-12 LAB — BRAIN NATRIURETIC PEPTIDE: B Natriuretic Peptide: 856.1 pg/mL — ABNORMAL HIGH (ref 0.0–100.0)

## 2021-11-12 MED ORDER — ASPIRIN 81 MG PO CHEW
81.0000 mg | CHEWABLE_TABLET | Freq: Every day | ORAL | Status: DC
  Administered 2021-11-12 – 2021-11-17 (×6): 81 mg via ORAL
  Filled 2021-11-12 (×7): qty 1

## 2021-11-12 MED ORDER — AMIODARONE HCL 200 MG PO TABS
200.0000 mg | ORAL_TABLET | Freq: Two times a day (BID) | ORAL | Status: DC
  Administered 2021-11-12 – 2021-11-17 (×11): 200 mg via ORAL
  Filled 2021-11-12 (×12): qty 1

## 2021-11-12 MED ORDER — FUROSEMIDE 40 MG PO TABS
40.0000 mg | ORAL_TABLET | Freq: Once | ORAL | Status: AC
  Administered 2021-11-12: 13:00:00 40 mg via ORAL
  Filled 2021-11-12: qty 1

## 2021-11-12 NOTE — Progress Notes (Signed)
[place] for heparin Indication: atrial fibrillation  Allergies  Allergen Reactions   Codeine Other (See Comments)    Patient was "all over the place"- undesired side effect   Sulfasalazine Other (See Comments)    Reaction not recalled   Sulfonamide Derivatives Other (See Comments)    Reaction not recalled    Patient Measurements: Height: 5\' 5"  (165.1 cm) Weight: 70 kg (154 lb 5.2 oz) IBW/kg (Calculated) : 61.5 Heparin Dosing Weight: 72.6 kg  Vital Signs: Temp: 98.8 F (37.1 C) (12/23 0742) Temp Source: Oral (12/23 0742) BP: 114/53 (12/23 0742) Pulse Rate: 73 (12/23 0742)  Labs: Recent Labs    11/10/21 1200 11/11/21 0536 11/11/21 0951 11/11/21 1353 11/12/21 0134 11/12/21 0900  HGB 10.5* 10.3*  --   --  9.9*  --   HCT 34.0* 31.4*  --   --  30.3*  --   PLT 221 206  --   --  223  --   APTT  --   --   --  43*  --   --   HEPARINUNFRC  --   --   --   --  0.69 0.60  CREATININE 2.30* 2.07*  --   --  2.17*  --   TROPONINIHS  --   --  760*  --   --   --      Estimated Creatinine Clearance: 15.4 mL/min (A) (by C-G formula based on SCr of 2.17 mg/dL (H)).   Assessment: 87 yo M with A fib RVR in setting of NSTEMI. Not on anticoagulation prior to admission. Pharmacy consulted for heparin dosing for 48 hours - started 12/22 1500.  Heparin level 0.60 (therapeutic x2) on infusion at 900 units/hr. H/H stable, PLT WNL. No bleeding noted.  Goal of Therapy:  Heparin level 0.3-0.7 units/ml Monitor platelets by anticoagulation protocol: Yes   Plan:  Continue heparin infusion 900 units/hr  Daily heparin level, CBC  F/U transition to Eliquis 12/24 @1400   Adria Dill, PharmD PGY-1 Acute Care Resident  11/12/2021 9:59 AM

## 2021-11-12 NOTE — Progress Notes (Signed)
PROGRESS NOTE                                                                                                                                                                                                             Patient Demographics:    Robert Rowe, is a 87 y.o. 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 lower abdominal discomfort in the hospital he was diagnosed with pyelonephritis on the left side due to a past left ureteric stone.  He also had sepsis and subsequently went into A. fib RVR.  He came into my care early morning 11/11/2021 at 7 AM.   Subjective:   Patient in bed, appears comfortable, denies any headache, no fever, no chest pain or pressure, no shortness of breath , no abdominal pain. No focal weakness.   Assessment  & Plan :     Sepsis due to left-sided pyelonephritis caused by a passed left ureteral stone - he been placed on IV fluids along with empiric IV antibiotics, cultures so far negative, sepsis pathophysiology is improving, continue antibiotics stone likely has passed as per CT scan.  2.  Paroxysmal A. fib RVR with NSTEMI, [place] vas 2 score of greater than 5.  He had sustained tachycardia night of 11/10/2021 due to A. fib RVR, he was also hypotensive, when I came to see him this morning he was profoundly hypotensive, tachycardic and had wheezes and crackles.   I think due to sustained tachycardia he has had NSTEMI, troponin is elevated and so his BNP, echocardiogram noted with EF now dropped from  60% to 45% with wall motion abnormality, continue heparin drip for total of 48 hours then switch to Eliquis for underlying A. fib, Plavix stopped for now placed on baby aspirin, blood pressure too low for beta-blocker, continue statin for secondary prevention.  RVR in good control and will switch from IV amiodarone drip to oral amiodarone.    3. HX  of stroke with right-sided weakness.  We will stop Plavix and switch him to heparin drip, once he is better and close to discharge on Eliquis.  4.  Dyslipidemia.  On statin.  5.  Acute on chronic CHF.  See #2 above.  Echo being done.  Would be interested in evaluating for wall motion and particularly his RV.  6.  GERD.  PPI.  7.  Hypothyroidism.  On Synthroid.  Stable TSH.  8.  Mildly prolonged QTC.  Give magnesium monitor electrolytes.  When blood pressure improves low-dose beta-blocker.  9.  Acute systolic and diastolic CHF kindly see #2 above.  Gentle diuresis as tolerated by blood pressure.  Midodrine to augment blood pressure.  Cannot use ACE and ARB for underlying CKD  and hypertension.  10.  AKI on CKD 4.  Baseline creatinine around 1.8. Monitor with as needed diuresis.  Overall extremely tenuous due to sepsis, pyelonephritis, A. fib RVR and NSTEMI with hypotension.  Continue medical treatment, DNR, if declines further then comfort measures.       Condition - Extremely Guarded  Family Communication  : Son and wife bedside on 11/11/2021  Code Status : DNR  Consults  : Palliative care  PUD Prophylaxis : PPI   Procedures  :     CT - 1. Mild left hydroureter with associated periureteral fat stranding. Mild perivesicular fat stranding. No definite ureterolithiasis. Bilateral nonobstructive punctate nephrolithiasis. Finding likely represents a recently passed stone. Differential diagnosis includes chronic changes of obstructive uropathy or reflux. Recommend correlation with urinalysis to evaluate for superimposed infection. 2. Interval  development of an L1 compression fracture with greater than 25% vertebral body height loss. Correlate with point tenderness to palpation to evaluate for an acute abnormality. 3. Partially visualized large hiatal hernia containing the entire stomach. No findings of associated bowel obstruction. Other imaging findings of potential clinical significance: 1. Scattered colonic diverticulosis with no acute diverticulitis. 2. Diffuse decreased bone density with interval worsening of multilevel severe degenerative changes of the spine. 3. Distal infrarenal abdominal aorta aneurysm (3 cm). Recommend follow-up ultrasound every 3 years. This recommendation follows ACR consensus guidelines: White Paper of the ACR Incidental Findings Committee II on Vascular Findings.  TTE -  1. Left ventricular ejection fraction, by estimation, is 40 to 45%. The left ventricle has mildly decreased function. The left ventricle demonstrates regional wall motion abnormalities (see scoring diagram/findings for description). Left ventricular diastolic parameters are consistent with Grade II diastolic dysfunction (pseudonormalization).  2. Right ventricular systolic function is moderately reduced. The right ventricular size is not well visualized. There is moderately elevated pulmonary artery systolic pressure.  3. The mitral valve was not well visualized. Mild to moderate mitral valve regurgitation. Moderate mitral annular calcification.  4. Tricuspid valve regurgitation is moderate.  5. The aortic valve was not well visualized. Aortic valve regurgitation is not visualized      Disposition Plan  :    Status is: Inpatient  Remains inpatient appropriate because: Pyelonephritis, NSTEMI, A. fib RVR   DVT Prophylaxis  :        Lab Results  Component Value Date   PLT 223 11/12/2021    Diet :  Diet Order             Diet Heart Room service appropriate? Yes; Fluid consistency: Thin  Diet effective now                     Inpatient Medications  Scheduled Meds:  amiodarone  200 mg Oral BID   atorvastatin  40 mg Oral Daily   fentaNYL (SUBLIMAZE) injection  50 mcg Intravenous Once   furosemide  40 mg Oral Once   latanoprost  1 drop Both Eyes QHS   levothyroxine  50 mcg Oral Q0600   midodrine  10 mg Oral TID WC   pantoprazole  40 mg Oral Daily   Continuous Infusions:  cefTRIAXone (ROCEPHIN)  IV Stopped (11/12/21 0152)   heparin 900 Units/hr (11/12/21 1000)   PRN Meds:.acetaminophen **OR** acetaminophen, albuterol, nitroGLYCERIN, senna-docusate  Antibiotics  :    Anti-infectives (From admission, onward)    Start     Dose/Rate Route Frequency Ordered Stop   11/11/21 0200  cefTRIAXone (ROCEPHIN) 2 g in sodium chloride 0.9 % 100 mL IVPB        2 g 200 mL/hr over 30 Minutes Intravenous Every 24 hours 11/10/21 0814     11/10/21 1145  cefTRIAXone (ROCEPHIN) 2 g in sodium chloride 0.9 % 100 mL IVPB  Status:  Discontinued        2 g 200 mL/hr over 30 Minutes Intravenous Every 24 hours 11/10/21 1138 11/10/21 1145   11/10/21 0000  cefTRIAXone (ROCEPHIN) 2 g in sodium chloride 0.9 % 100 mL IVPB        2 g 200 mL/hr over 30 Minutes Intravenous  Once 11/09/21 2354 11/10/21 0115        Time Spent in minutes  30   Lala Lund M.D on 11/12/2021 at 12:13 PM  To page go to [email or web address]   [facility] -  Office  [phone]  See all Orders from today for further details    Objective:   Vitals:   11/12/21 0800 11/12/21 0900 11/12/21 1000 11/12/21 1121  BP: (!) 91/54 (!) 118/56 115/68 (!) 91/48  Pulse: 74 81 81 66  Resp: (!) 22 (!) 27 (!) 21 19  Temp:    97.8 F (36.6 C)  TempSrc:    Oral  SpO2: 100% 100% 100% 100%  Weight:      Height:        Wt Readings from Last 3 Encounters:  11/11/21 70 kg  04/15/21 66.4 kg  09/11/20 63.5 kg     Intake/Output Summary (Last 24 hours) at 11/12/2021 1213 Last data filed at 11/12/2021 1000 Gross per 24 hour  Intake 1052.17 ml  Output  2275 ml  Net -1222.83 ml     Physical Exam  Awake Alert, No new F.N deficits, Normal affect [place].AT,PERRAL Supple Neck, No JVD,   Symmetrical Chest wall movement, Good air movement bilaterally, few rales RRR,No Gallops, Rubs or new Murmurs,  +ve B.Sounds, Abd Soft, No tenderness,   No Cyanosis, Clubbing or edema      Data Review:    CBC Recent Labs  Lab 11/09/21 1908 11/10/21 0830 11/10/21 1200 11/11/21 0536 11/12/21 0134  WBC 20.5* 18.0* 16.8* 12.1* 12.4*  HGB 14.1 10.8* 10.5* 10.3* 9.9*  HCT 44.3 33.4* 34.0* 31.4* 30.3*  PLT 315 235 221 206 223  MCV 100.7* 101.2* 102.4* 99.1 98.4  MCH 32.0 32.7 31.6 32.5 32.1  MCHC 31.8 32.3 30.9 32.8 32.7  RDW 13.6 13.7 13.7 13.8 14.0  LYMPHSABS 1.3 2.0  --  2.0 2.1  MONOABS 1.2* 1.5*  --  0.9 1.0  EOSABS 0.0 0.0  --  0.1 0.1  BASOSABS 0.1 0.1  --  0.1 0.1    Electrolytes Recent Labs  Lab 11/09/21 1908 11/09/21 2047 11/10/21 0038 11/10/21 0343 11/10/21 1696  11/10/21 0830 11/10/21 1200 11/11/21 0536 11/11/21 0951 11/12/21 0134  NA 137  --   --   --   --  136 138 137  --  138  K 4.4  --   --   --   --  4.2 4.3 3.7  --  4.0  CL 101  --   --   --   --  104 107 107  --  105  CO2 25  --   --   --   --  24 24 22   --  23  GLUCOSE 171*  --   --   --   --  134* 133* 126*  --  132*  BUN 35*  --   --   --   --  35* 33* 30*  --  28*  CREATININE 2.47*  --   --   --   --  2.26* 2.30* 2.07*  --  2.17*  CALCIUM 8.9  --   --   --   --  8.2* 8.3* 8.0*  --  8.2*  AST 16  --   --   --   --   --  14*  --   --  21  ALT 12  --   --   --   --   --  10  --   --  12  ALKPHOS 86  --   --   --   --   --  59  --   --  52  BILITOT 1.0  --   --   --   --   --  0.7  --   --  0.5  ALBUMIN 3.5  --   --   --   --   --  2.5*  --   --  2.4*  MG  --   --   --   --   --   --   --   --   --  1.9  LATICACIDVEN  --  2.5* 2.5* 3.0* 2.6*  --   --  1.2  --   --   TSH  --   --   --   --   --   --   --   --  3.305  --   BNP  --   --   --   --   --   --   --    --  532.2* 856.1*    ------------------------------------------------------------------------------------------------------------------ No results for input(s): CHOL, HDL, LDLCALC, TRIG, CHOLHDL, LDLDIRECT in the last 72 hours.  Lab Results  Component Value Date   HGBA1C 6.0 (H) 05/13/2019    Recent Labs    11/11/21 0951  TSH 3.305   ------------------------------------------------------------------------------------------------------------------ ID Labs Recent Labs  Lab 11/09/21 1908 11/09/21 2047 11/10/21 0038 11/10/21 0343 11/10/21 0641 11/10/21 0830 11/10/21 1200 11/11/21 0536 11/12/21 0134  WBC 20.5*  --   --   --   --  18.0* 16.8* 12.1* 12.4*  PLT 315  --   --   --   --  235 221 206 223  LATICACIDVEN  --  2.5* 2.5* 3.0* 2.6*  --   --  1.2  --   CREATININE 2.47*  --   --   --   --  2.26* 2.30* 2.07* 2.17*       Micro Results Recent Results (from the past 240 hour(s))  Urine Culture     Status: Abnormal  Collection Time: 11/09/21  7:10 PM   Specimen: Urine, Clean Catch  Result Value Ref Range Status   Specimen Description URINE, CLEAN CATCH  Final   Special Requests   Final    NONE Performed at [facility], [address], [place], [place] [address]    Culture MULTIPLE SPECIES PRESENT, SUGGEST RECOLLECTION (A)  Final   Report Status 11/11/2021 FINAL  Final  Culture, blood (single)     Status: None (Preliminary result)   Collection Time: 11/10/21 12:13 AM   Specimen: BLOOD  Result Value Ref Range Status   Specimen Description BLOOD RIGHT ANTECUBITAL  Final   Special Requests   Final    BOTTLES DRAWN AEROBIC AND ANAEROBIC Blood Culture results may not be optimal due to an inadequate volume of blood received in culture bottles   Culture   Final    NO GROWTH 2 DAYS Performed at [facility], [place] [address], [place], [place] [address]    Report Status PENDING  Incomplete  Culture, blood (routine x 2)     Status: None (Preliminary result)    Collection Time: 11/10/21  8:30 AM   Specimen: BLOOD  Result Value Ref Range Status   Specimen Description BLOOD LEFT ANTECUBITAL  Final   Special Requests   Final    BOTTLES DRAWN AEROBIC AND ANAEROBIC Blood Culture results may not be optimal due to an inadequate volume of blood received in culture bottles   Culture   Final    NO GROWTH 2 DAYS Performed at [facility], [place] [address] [place], [place] [address]    Report Status PENDING  Incomplete  Culture, blood (routine x 2)     Status: None (Preliminary result)   Collection Time: 11/10/21  8:46 AM   Specimen: BLOOD  Result Value Ref Range Status   Specimen Description BLOOD RIGHT ANTECUBITAL  Final   Special Requests   Final    BOTTLES DRAWN AEROBIC AND ANAEROBIC Blood Culture adequate volume   Culture   Final    NO GROWTH 2 DAYS Performed at [facility], [place] [address], [place],  [address]    Report Status PENDING  Incomplete    Radiology Reports CT ABDOMEN PELVIS WO CONTRAST  Result Date: 11/09/2021 CLINICAL DATA:  Abdominal pain, acute, nonlocalize. Sudden onset left lower quadrant and central abdominal pain. Labs will not allow dissection study initially. Rule out obstruction verse diverticulitis versus aorta versus bowel obstruction versus other EXAM: CT ABDOMEN AND PELVIS WITHOUT CONTRAST TECHNIQUE: Multidetector CT imaging of the abdomen and pelvis was performed following the standard protocol without IV contrast. COMPARISON:  CT abdomen pelvis 01/12/2019 FINDINGS: Lower chest: Bronchial wall thickening of the right lower lobe. Four-vessel coronary calcifications. Partially visualized large hiatal hernia containing the entire stomach. Hepatobiliary: No focal liver abnormality. No gallstones, gallbladder wall thickening, or pericholecystic fluid. No biliary dilatation. Pancreas: No focal lesion. Normal pancreatic contour. No surrounding inflammatory changes. No main pancreatic ductal dilatation. Spleen:  Normal in size without focal abnormality. Adrenals/Urinary Tract: No adrenal nodule bilaterally. Bilateral punctate nephrolithiasis. No hydronephrosis. Fluid density lesions within the kidneys likely represent simple renal cysts. Subcentimeter hypodensities are too small to characterize. No ureterolithiasis. No right ureterolithiasis. Mild left hydroureter with associated fat stranding surrounding the ureter. Mild perivesicular fat stranding. Otherwise the urinary bladder is unremarkable. Possible TURP procedure surgical changes. Stomach/Bowel: Otherwise stomach is within normal limits. No pneumatosis. No evidence of bowel wall thickening or dilatation. Scattered colonic  diverticulosis. Appendix appears normal. Vascular/Lymphatic: Distal intervened abdominal aorta measures at the upper limits of normal (3 cm). No iliac aneurysm. Mild atherosclerotic plaque of the aorta and its branches. No abdominal, pelvic, or inguinal lymphadenopathy. Reproductive: Prominent prostate. Other: No intraperitoneal free fluid. No intraperitoneal free gas. No organized fluid collection. Musculoskeletal: No abdominal wall hernia or abnormality. Diffusely decreased bone density. No suspicious lytic or blastic osseous lesions. Interval development of an L1 compression fracture with greater than 25% vertebral body height loss. Interval worsening of multilevel severe degenerative changes of the spine. IMPRESSION: 1. Mild left hydroureter with associated periureteral fat stranding. Mild perivesicular fat stranding. No definite ureterolithiasis. Bilateral nonobstructive punctate nephrolithiasis. Finding likely represents a recently passed stone. Differential diagnosis includes chronic changes of obstructive uropathy or reflux. Recommend correlation with urinalysis to evaluate for superimposed infection. 2. Interval development of an L1 compression fracture with greater than 25% vertebral body height loss. Correlate with point tenderness to  palpation to evaluate for an acute abnormality. 3. Partially visualized large hiatal hernia containing the entire stomach. No findings of associated bowel obstruction. Other imaging findings of potential clinical significance: 1. Scattered colonic diverticulosis with no acute diverticulitis. 2. Diffuse decreased bone density with interval worsening of multilevel severe degenerative changes of the spine. 3. Distal infrarenal abdominal aorta aneurysm (3 cm). Recommend follow-up ultrasound every 3 years. This recommendation follows ACR consensus guidelines: White Paper of the ACR Incidental Findings Committee II on Vascular Findings. J Am Coll Radiol 2013; 57:322-025. 4. Aortic Atherosclerosis (ICD10-I70.0). Aortic aneurysm NOS (ICD10-I71.9). Electronically Signed   By: Iven Finn M.D.   On: 11/09/2021 23:33   DG Chest Port 1 View  Result Date: 11/12/2021 CLINICAL DATA:  Shortness of breath with wheezing and cough. EXAM: PORTABLE CHEST 1 VIEW COMPARISON:  11/11/2021 FINDINGS: 0809 hours. Low volumes. Bibasilar collapse/consolidation with small bilateral pleural effusions. The cardio pericardial silhouette is enlarged. The visualized bony structures of the thorax show no acute abnormality. Telemetry leads overlie the chest. IMPRESSION: Mild progression of bibasilar collapse/consolidation with small bilateral pleural effusions. Electronically Signed   By: Misty Stanley M.D.   On: 11/12/2021 08:31   DG Chest Port 1 View  Result Date: 11/11/2021 CLINICAL DATA:  Shortness of breath EXAM: PORTABLE CHEST 1 VIEW COMPARISON:  04/13/2021 FINDINGS: Heart is normal size. Bibasilar airspace opacities are similar prior study. Hiatal hernia noted. No effusions or acute bony abnormality. IMPRESSION: Chronic bibasilar opacities, similar prior study. Favor scarring or atelectasis. Infiltrate cannot be completely excluded at the left lung base. Moderate-sized hiatal hernia. Electronically Signed   By: Rolm Baptise M.D.    On: 11/11/2021 10:02   ECHOCARDIOGRAM COMPLETE  Result Date: 11/11/2021    ECHOCARDIOGRAM REPORT   Patient Name:   [name] Date of Exam: 11/11/2021 Medical Rec #:  [id number]         Height:       65.0 in Accession #:    [id number]        Weight:       154.3 lb Date of Birth:  1917/11/30        BSA:          1.772 m Patient Age:    89 years         BP:           99/45 mmHg Patient Gender: M                 HR:  86 bpm. Exam Location:  Inpatient Procedure: 2D Echo, Cardiac Doppler, Color Doppler and Intracardiac            Opacification Agent                        STAT ECHO Reported to: Dr Lala Lund on 11/11/2021 2:00:00 PM               New WMA and elevated RVSP. Indications:    NSTEMI I21.4  History:        Patient has prior history of Echocardiogram examinations, most                 recent 04/14/2021. CHF, Arrythmias:Atrial Fibrillation,                 Tachycardia and Mildly prolonged QTC; Risk Factors:Dyslipidemia                 and Hypertension. Hypothyroidism.  Sonographer:    Darlina Sicilian RDCS Referring Phys: Arnell Asal Margaree Mackintosh Langdon  1. Left ventricular ejection fraction, by estimation, is 40 to 45%. The left ventricle has mildly decreased function. The left ventricle demonstrates regional wall motion abnormalities (see scoring diagram/findings for description). Left ventricular diastolic parameters are consistent with Grade II diastolic dysfunction (pseudonormalization).  2. Right ventricular systolic function is moderately reduced. The right ventricular size is not well visualized. There is moderately elevated pulmonary artery systolic pressure.  3. The mitral valve was not well visualized. Mild to moderate mitral valve regurgitation. Moderate mitral annular calcification.  4. Tricuspid valve regurgitation is moderate.  5. The aortic valve was not well visualized. Aortic valve regurgitation is not visualized. Comparison(s): Changes from prior study are noted.  Conclusion(s)/Recommendation(s): Even with use of echo contrast, very difficult images. There appears to be inferior/lateral LV wall motion abnormalities with mildly decreased LV function. The RV is not well seen, but function appears likely moderately reduced, and there is elevated RVSP. FINDINGS  Left Ventricle: Left ventricular ejection fraction, by estimation, is 40 to 45%. The left ventricle has mildly decreased function. The left ventricle demonstrates regional wall motion abnormalities. Definity contrast agent was given IV to delineate the left ventricular endocardial borders. The left ventricular internal cavity size was normal in size. There is no left ventricular hypertrophy. Left ventricular diastolic parameters are consistent with Grade II diastolic dysfunction (pseudonormalization).  LV Wall Scoring: The antero-lateral wall, inferior wall, and posterior wall are hypokinetic. The entire septum, entire apex, and basal anterior segment are normal. Right Ventricle: The right ventricular size is not well visualized. Right vetricular wall thickness was not well visualized. Right ventricular systolic function is moderately reduced. There is moderately elevated pulmonary artery systolic pressure. The tricuspid regurgitant velocity is 3.44 m/s, and with an assumed right atrial pressure of 8 mmHg, the estimated right ventricular systolic pressure is 25.0 mmHg. Left Atrium: Left atrial size was normal in size. Right Atrium: Right atrial size was normal in size. Pericardium: There is no evidence of pericardial effusion. Mitral Valve: The mitral valve was not well visualized. Moderate mitral annular calcification. Mild to moderate mitral valve regurgitation. Tricuspid Valve: The tricuspid valve is grossly normal. Tricuspid valve regurgitation is moderate. Aortic Valve: The aortic valve was not well visualized. Aortic valve regurgitation is not visualized. Pulmonic Valve: The pulmonic valve was not well visualized.  Pulmonic valve regurgitation is not visualized. Aorta: The ascending aorta was not well visualized, the aortic arch was not well visualized and  the aortic root is normal in size and structure. IAS/Shunts: The interatrial septum was not well visualized.  LEFT VENTRICLE PLAX 2D LVIDd:         4.80 cm      Diastology LVIDs:         3.50 cm      LV e' medial:    4.24 cm/s LV PW:         0.80 cm      LV E/e' medial:  30.9 LV IVS:        0.80 cm      LV e' lateral:   7.83 cm/s LVOT diam:     1.80 cm      LV E/e' lateral: 16.7 LV SV:         28 LV SV Index:   16 LVOT Area:     2.54 cm  LV Volumes (MOD) LV vol d, MOD A2C: 120.0 ml LV vol d, MOD A4C: 71.0 ml LV vol s, MOD A2C: 77.3 ml LV vol s, MOD A4C: 39.9 ml LV SV MOD A2C:     42.7 ml LV SV MOD A4C:     71.0 ml LV SV MOD BP:      37.8 ml RIGHT VENTRICLE RV S prime:     5.87 cm/s TAPSE (M-mode): 1.0 cm LEFT ATRIUM             Index        RIGHT ATRIUM           Index LA diam:        3.80 cm 2.14 cm/m   RA Area:     11.10 cm LA Vol (A2C):   22.3 ml 12.59 ml/m  RA Volume:   22.00 ml  12.42 ml/m LA Vol (A4C):   31.2 ml 17.61 ml/m LA Biplane Vol: 28.4 ml 16.03 ml/m  AORTIC VALVE LVOT Vmax:   61.50 cm/s LVOT Vmean:  42.700 cm/s LVOT VTI:    0.110 m  AORTA Ao Root diam: 3.20 cm MITRAL VALVE                  TRICUSPID VALVE MV Area (PHT): 3.65 cm       TR Peak grad:   47.3 mmHg MV Decel Time: 208 msec       TR Vmax:        344.00 cm/s MR Peak grad:    78.5 mmHg MR Mean grad:    54.0 mmHg    SHUNTS MR Vmax:         443.00 cm/s  Systemic VTI:  0.11 m MR Vmean:        353.0 cm/s   Systemic Diam: 1.80 cm MR PISA:         0.57 cm MR PISA Eff ROA: 4 mm MR PISA Radius:  0.30 cm MV E velocity: 131.00 cm/s MV A velocity: 68.70 cm/s MV E/A ratio:  1.91 Buford Dresser MD Electronically signed by Buford Dresser MD Signature Date/Time: 11/11/2021/2:01:00 PM    Final

## 2021-11-12 NOTE — Progress Notes (Incomplete)
0700 - Report received from PM RN.  All questions answered.  Safety checks performed.  Lines verified. Hand hygiene performed before/after each pt contact.  Pt's son present at bedside with questions. 0900 - Assessment/Rx.  Pt's son's questions answered fully. 1000 - Pt's breakfast arrived.  Pt's son assisted him with eating.  Pt's son left.   1030 - Pt bathed. 1100 - Pt's rabbi visited. 1200 - Pt's wife arrived to visit. 20 - Pt's wife c/o pt wheezing.  Respiratory notified.  Pt w/expiratory wheezes.  SpO2 is 100%.  Pt's lunch arrived. 1500 - Pt declined to eat the lunch provided; however, he ate a cup of chocolate pudding and some graham crackers. 60 - Pt's grandson arrived to visit.  Pt's wife requested that pt's eyedrops be placed in the refrigerator.  Pt's dinner arrived.  Pt's wife remarked that pt was wheezing.  Pt with scant tracheal wheezes.  SpO2 100%.  Respiratory notified via secure chat, per wife's request.

## 2021-11-12 NOTE — Care Management Important Message (Signed)
Important Message  Patient Details  Name: Robert Rowe MRN: [id number] Date of Birth: 1918/11/06   Medicare Important Message Given:  Yes     Pollie Poma [address] 11/12/2021, 3:33 PM

## 2021-11-12 NOTE — Plan of Care (Signed)
  Problem: Education: Goal: Knowledge of General Education information will improve Description: Including pain rating scale, medication(s)/side effects and non-pharmacologic comfort measures Outcome: Progressing   Problem: Safety: Goal: Ability to remain free from injury will improve Outcome: Progressing   Problem: Skin Integrity: Goal: Risk for impaired skin integrity will decrease Outcome: Progressing   

## 2021-11-12 NOTE — Care Management Important Message (Signed)
Important Message  Patient Details  Name: Robert Rowe MRN: [id number] Date of Birth: 08-Apr-1918   Medicare Important Message Given:  Yes CORRECTION  Patient left prior to IM delivery will mail to the patient home address.     Monserath Neff 11/12/2021, 3:33 PM

## 2021-11-12 NOTE — Progress Notes (Signed)
[facility] for heparin Indication: atrial fibrillation  Allergies  Allergen Reactions   Codeine Other (See Comments)    Patient was "all over the place"- undesired side effect   Sulfasalazine Other (See Comments)    Reaction not recalled   Sulfonamide Derivatives Other (See Comments)    Reaction not recalled    Patient Measurements: Height: 5\' 5"  (165.1 cm) Weight: 70 kg (154 lb 5.2 oz) IBW/kg (Calculated) : 61.5 Heparin Dosing Weight: 72.6 kg  Vital Signs: Temp: 98.1 F (36.7 C) (12/23 0138) Temp Source: Axillary (12/23 0138) BP: 112/58 (12/23 0138) Pulse Rate: 78 (12/23 0138)  Labs: Recent Labs    11/10/21 1200 11/11/21 0536 11/11/21 0951 11/11/21 1353 11/12/21 0134  HGB 10.5* 10.3*  --   --  9.9*  HCT 34.0* 31.4*  --   --  30.3*  PLT 221 206  --   --  223  APTT  --   --   --  43*  --   HEPARINUNFRC  --   --   --   --  0.69  CREATININE 2.30* 2.07*  --   --  2.17*  TROPONINIHS  --   --  760*  --   --      Estimated Creatinine Clearance: 15.4 mL/min (A) (by C-G formula based on SCr of 2.17 mg/dL (H)).   Assessment: 87 yo M with A fib RVR in setting of NSTEMI. Not on anticoagulation prior to admission. Pharmacy consulted for heparin dosing for 48 hours - started 12/22 1500.  Heparin level 0.69 (therapeutic) on infusion at 900 units/hr. No bleeding noted.  Goal of Therapy:  Heparin level 0.3-0.7 units/ml Monitor platelets by anticoagulation protocol: Yes   Plan:  Continue heparin infusion 900 units/hr  F/u 6 hr heparin level to confirm F/U transition to Eliquis 12/24 @1400   Sherlon Handing, PharmD, BCPS Please see amion for complete clinical pharmacist phone list 11/12/2021  2:40 AM

## 2021-11-13 DIAGNOSIS — N12 Tubulo-interstitial nephritis, not specified as acute or chronic: Secondary | ICD-10-CM | POA: Diagnosis not present

## 2021-11-13 LAB — COMPREHENSIVE METABOLIC PANEL
ALT: 12 U/L (ref 0–44)
AST: 18 U/L (ref 15–41)
Albumin: 2.4 g/dL — ABNORMAL LOW (ref 3.5–5.0)
Alkaline Phosphatase: 57 U/L (ref 38–126)
Anion gap: 10 (ref 5–15)
BUN: 26 mg/dL — ABNORMAL HIGH (ref 8–23)
CO2: 26 mmol/L (ref 22–32)
Calcium: 8.3 mg/dL — ABNORMAL LOW (ref 8.9–10.3)
Chloride: 102 mmol/L (ref 98–111)
Creatinine, Ser: 2.37 mg/dL — ABNORMAL HIGH (ref 0.61–1.24)
GFR, Estimated: 24 mL/min — ABNORMAL LOW (ref >60)
Glucose, Bld: 128 mg/dL — ABNORMAL HIGH (ref 70–99)
Potassium: 3.9 mmol/L (ref 3.5–5.1)
Sodium: 138 mmol/L (ref 135–145)
Total Bilirubin: 0.3 mg/dL (ref 0.3–1.2)
Total Protein: 5.2 g/dL — ABNORMAL LOW (ref 6.5–8.1)

## 2021-11-13 LAB — CBC WITH DIFFERENTIAL/PLATELET
Abs Immature Granulocytes: 0.03 10*3/uL (ref 0.00–0.07)
Basophils Absolute: 0.1 10*3/uL (ref 0.0–0.1)
Basophils Relative: 1 %
Eosinophils Absolute: 0.3 10*3/uL (ref 0.0–0.5)
Eosinophils Relative: 2 %
HCT: 32 % — ABNORMAL LOW (ref 39.0–52.0)
Hemoglobin: 10.3 g/dL — ABNORMAL LOW (ref 13.0–17.0)
Immature Granulocytes: 0 %
Lymphocytes Relative: 20 %
Lymphs Abs: 2.2 10*3/uL (ref 0.7–4.0)
MCH: 32.1 pg (ref 26.0–34.0)
MCHC: 32.2 g/dL (ref 30.0–36.0)
MCV: 99.7 fL (ref 80.0–100.0)
Monocytes Absolute: 1 10*3/uL (ref 0.1–1.0)
Monocytes Relative: 9 %
Neutro Abs: 7.8 10*3/uL — ABNORMAL HIGH (ref 1.7–7.7)
Neutrophils Relative %: 68 %
Platelets: 233 10*3/uL (ref 150–400)
RBC: 3.21 MIL/uL — ABNORMAL LOW (ref 4.22–5.81)
RDW: 14.1 % (ref 11.5–15.5)
WBC: 11.4 10*3/uL — ABNORMAL HIGH (ref 4.0–10.5)
nRBC: 0 % (ref 0.0–0.2)

## 2021-11-13 LAB — BRAIN NATRIURETIC PEPTIDE: B Natriuretic Peptide: 551.9 pg/mL — ABNORMAL HIGH (ref 0.0–100.0)

## 2021-11-13 LAB — MAGNESIUM: Magnesium: 2 mg/dL (ref 1.7–2.4)

## 2021-11-13 LAB — HEPARIN LEVEL (UNFRACTIONATED): Heparin Unfractionated: 0.4 IU/mL (ref 0.30–0.70)

## 2021-11-13 MED ORDER — DIGOXIN 0.25 MG/ML IJ SOLN
0.2500 mg | Freq: Once | INTRAMUSCULAR | Status: AC
  Administered 2021-11-13: 13:00:00 0.25 mg via INTRAVENOUS
  Filled 2021-11-13: qty 2

## 2021-11-13 MED ORDER — APIXABAN 2.5 MG PO TABS
2.5000 mg | ORAL_TABLET | Freq: Two times a day (BID) | ORAL | Status: DC
  Administered 2021-11-13 – 2021-11-17 (×9): 2.5 mg via ORAL
  Filled 2021-11-13 (×10): qty 1

## 2021-11-13 NOTE — Progress Notes (Signed)
[facility] for heparin transition to Eliquis Indication: atrial fibrillation  Allergies  Allergen Reactions   Codeine Other (See Comments)    Patient was "all over the place"- undesired side effect   Sulfasalazine Other (See Comments)    Reaction not recalled   Sulfonamide Derivatives Other (See Comments)    Reaction not recalled    Patient Measurements: Height: 5\' 5"  (165.1 cm) Weight: 70.5 kg (155 lb 6.8 oz) IBW/kg (Calculated) : 61.5 Heparin Dosing Weight: 72.6 kg  Vital Signs: Temp: 97.7 F (36.5 C) (12/24 0300) Temp Source: Oral (12/24 0300) BP: 110/60 (12/24 0300) Pulse Rate: 79 (12/24 0300)  Labs: Recent Labs    11/11/21 0536 11/11/21 0951 11/11/21 1353 11/12/21 0134 11/12/21 0900 11/13/21 0159  HGB 10.3*  --   --  9.9*  --  10.3*  HCT 31.4*  --   --  30.3*  --  32.0*  PLT 206  --   --  223  --  233  APTT  --   --  43*  --   --   --   HEPARINUNFRC  --   --   --  0.69 0.60 0.40  CREATININE 2.07*  --   --  2.17*  --  2.37*  TROPONINIHS  --  760*  --   --   --   --      Estimated Creatinine Clearance: 14.1 mL/min (A) (by C-G formula based on SCr of 2.37 mg/dL (H)).   Assessment: 87 yo M with A fib RVR in setting of NSTEMI. Not on anticoagulation prior to admission. Pharmacy consulted for heparin dosing for 48 hours - started 12/22 1500.  Heparin level 0.40 (therapeutic x3) on infusion at 900 units/hr. H/H stable, PLT WNL. No bleeding noted.  Transitioning to Eliquis 2.5 mg BID 12/24 1400 for afib. Age 87, Scr 2.37, Wt 70.5 kg.  Goal of Therapy:  Heparin level 0.3-0.7 units/ml Monitor platelets by anticoagulation protocol: Yes   Plan:  Stop heparin infusion 900 units/hr @1400  Transition to Eliquis 12/24 @1400  CTM any s/sx of bleeding and daily CBC.   Varney Daily, PharmD PGY1 Pharmacy Resident  Please check AMION for all [facility] pharmacy phone numbers After 10:00 PM call main pharmacy [phone]

## 2021-11-13 NOTE — Evaluation (Signed)
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 lower abdominal discomfort, diagnosed with pyelonephritis.   Clinical Impression   Patient admitted for the diagnosis above.  PTA he lives with his spouse, and has PCA assist for 100 hours/week per the wife.  He is transferred to a w/c for 3 hours/day, and spends the remainder of the day in a hospital bed.  He is bathed in bed, and typically uses the bedpan for toileting.  Patient is essentially at his baseline, both OT and the spouse agree there are no post acute rehab needs.  Recommend home with prior level of supports when medically cleared.  Recommend up in the recliner 1x/day with RN staff.        Recommendations for follow up therapy are one component of a multi-disciplinary discharge planning process, led by the attending physician.  Recommendations may be updated based on patient status, additional functional criteria and insurance authorization.   Follow Up Recommendations  No OT follow up    Assistance Recommended at Discharge Frequent or constant Supervision/Assistance  Functional Status Assessment  Patient has not had a recent decline in their functional status  Equipment Recommendations  None recommended by OT    Recommendations for Other Services       Precautions / Restrictions Precautions Precautions: Fall Precaution Comments: R hemiparesis - arm worse than leg      Mobility Bed Mobility Overal bed mobility: Needs Assistance Bed Mobility: Supine to Sit     Supine to sit: Mod assist;HOB elevated       Patient Response: Cooperative  Transfers Overall transfer level: Needs assistance   Transfers: Bed  to chair/wheelchair/BSC     Squat pivot transfers: Max assist              Balance Overall balance assessment: Needs assistance Sitting-balance support: Feet supported;Single extremity supported Sitting balance-Leahy Scale: Fair     Standing balance support: Bilateral upper extremity supported Standing balance-Leahy Scale: Zero Standing balance comment: unable to maintain a standing position, dies not initiate any stepping motion.                           ADL either performed or assessed with clinical judgement   ADL Overall ADL's : At baseline                                             Vision Patient Visual Report: No change from baseline       Perception Perception Perception: Not tested   Praxis Praxis Praxis: Not tested    Pertinent Vitals/Pain Pain Assessment: No/denies pain     Hand Dominance Right   Extremity/Trunk Assessment Upper Extremity Assessment Upper Extremity Assessment: RUE deficits/detail RUE Deficits / Details: hemiparesis   Lower Extremity Assessment Lower Extremity Assessment: Defer to PT evaluation   Cervical / Trunk Assessment Cervical / Trunk Assessment: Kyphotic   Communication Communication Communication: HOH   Cognition Arousal/Alertness: Awake/alert Behavior During Therapy: WFL for tasks assessed/performed Overall Cognitive Status: History of cognitive impairments - at baseline  Home Living Family/patient expects to be discharged to:: Private residence Living Arrangements: Spouse/significant other Available Help at Discharge: Family;Personal care attendant Type of Home: House Home Access: Stairs to enter [employer] of Steps: Curtice: Two level;Able to live on main level with bedroom/bathroom Alternate Level Stairs-Number of Steps: flight with chair lift, but he does not go upstairs anymore    Bathroom Shower/Tub: Walk-in shower;Other (comment) (patient is bathed bedlevel)         Home Equipment: Wheelchair - manual;Hospital bed   Additional Comments: Lives at home with wife; has caregivers from 9:30p-3:00p, and now has somone that stay at night as well.      Prior Functioning/Environment Prior Level of Function : Needs assist  Cognitive Assist : Mobility (cognitive);ADLs (cognitive) Mobility (Cognitive): Step by step cues ADLs (Cognitive): Step by step cues Physical Assist : Mobility (physical);ADLs (physical) Mobility (physical): Bed mobility;Transfers ADLs (physical): Bathing;Dressing;Toileting;IADLs Mobility Comments: transfers to the w/c 1x/day ADLs Comments: bedlevel ADL and bedpan usage.        OT Problem List: Decreased strength;Decreased range of motion;Impaired balance (sitting and/or standing);Decreased cognition      OT Treatment/Interventions:      OT Goals(Current goals can be found in the care plan section) Acute Rehab OT Goals Patient Stated Goal: Return home Monday or Tuesday OT Goal Formulation: With family Time For Goal Achievement: 11/17/21 Potential to Achieve Goals: Good  OT Frequency:     Barriers to D/C:  None noted          Co-evaluation              AM-PAC OT "6 Clicks" Daily Activity     Outcome Measure Help from another person eating meals?: None Help from another person taking care of personal grooming?: A Lot Help from another person toileting, which includes using toliet, bedpan, or urinal?: Total Help from another person bathing (including washing, rinsing, drying)?: Total Help from another person to put on and taking off regular upper body clothing?: Total Help from another person to put on and taking off regular lower body clothing?: Total 6 Click Score: 10   End of Session Equipment Utilized During Treatment: Gait belt Nurse Communication: Mobility status  Activity Tolerance: Patient tolerated treatment  well Patient left: in chair;with call bell/phone within reach  OT Visit Diagnosis: Muscle weakness (generalized) (M62.81);Other abnormalities of gait and mobility (R26.89);Unsteadiness on feet (R26.81);Hemiplegia and hemiparesis Hemiplegia - Right/Left: Right Hemiplegia - dominant/non-dominant: Dominant                Time: 1155-2080 OT Time Calculation (min): 29 min Charges:  OT General Charges $OT Visit: 1 Visit OT Evaluation $OT Eval Moderate Complexity: 1 Mod OT Treatments $Self Care/Home Management : 8-22 mins  11/13/2021  RP, OTR/L  Acute Rehabilitation Services  Office:  [phone]   Metta Clines 11/13/2021, 11:28 AM

## 2021-11-13 NOTE — Plan of Care (Signed)

## 2021-11-13 NOTE — Progress Notes (Signed)
Received multiple phone calls from son Robert Rowe who was not at beside regarding patient's discharge from hospital. Robert Rowe is concerned that his wife Robert Rowe does not have adequate care at home.  Wife Robert Rowe at bedside states that her and patient's son have a difficult relationship, but she does have hired home aide to assist.  Spoke with Robert Rowe, and Robert Rowe to note that we as hospital staff will not be involved in complex family dynamics.  Robert Rowe to advise when patient is medically stable/cleared for discharge.  Both Robert Rowe/son and Robert Rowe/wife verbalize understanding.

## 2021-11-13 NOTE — Progress Notes (Addendum)
PROGRESS NOTE                                                                                                                                                                                                             Patient Demographics:    Robert Rowe, is a 87 y.o. 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 lower abdominal discomfort in the hospital he was diagnosed with pyelonephritis on the left side due to a past left ureteric stone.  He also had sepsis and subsequently went into A. fib RVR.  He came into my care early morning 11/11/2021 at 7 AM.   Subjective:   Patient in bed, appears comfortable, denies any headache, no fever, no chest pain or pressure, no shortness of breath , no abdominal pain. No new focal weakness.   Assessment  & Plan :     Sepsis due to left-sided pyelonephritis caused by a passed left ureteral stone - he been placed on IV fluids along with empiric IV antibiotics, cultures so far negative, sepsis pathophysiology is improving, continue antibiotics stone likely has passed as per CT scan.  2.  Paroxysmal A. fib RVR with NSTEMI, [place] vas 2 score of greater than 5.  He had sustained tachycardia night of 11/10/2021 due to A. fib RVR, he was also hypotensive, when I came to see him this morning he was profoundly hypotensive, tachycardic and had wheezes and crackles.  I think due to sustained tachycardia he had a NSTEMI, troponin was ++ elevated and so was his BNP, echocardiogram noted with EF now  dropped from 60% to 45% with wall motion abnormality, heparin drip for total of 48 hours then switch to Eliquis for underlying A. fib, Plavix stopped for now placed on baby aspirin, blood pressure too low for beta-blocker, continue statin for secondary prevention.  RVR in good control and switched from IV amiodarone drip to oral amiodarone.    3. HX  of stroke with right-sided weakness.  We will stop Plavix and switch him to heparin drip, once he is better and close to discharge on Eliquis.  4.  Dyslipidemia.  On statin.  5.  Acute on chronic CHF.  See #2 above.  Echo being done.  Would be interested in evaluating for wall motion and particularly his RV.  6.  GERD.  PPI.  7.  Hypothyroidism.  On Synthroid.  Stable TSH.  8.  Mildly prolonged QTC.  Give magnesium monitor electrolytes.  When blood pressure improves low-dose beta-blocker.  9.  Acute systolic and diastolic CHF kindly see #2 above.  Gentle diuresis as tolerated by blood pressure.  Midodrine to augment blood pressure.  Cannot use ACE and ARB for underlying CKD  and hypertension.  10.  AKI on CKD 4.  Baseline creatinine around 1.8. Monitor with as needed diuresis.  Overall extremely tenuous due to sepsis, pyelonephritis, A. fib RVR and NSTEMI with hypotension.  Continue medical treatment, DNR, if declines further then comfort measures.       Condition - Extremely Guarded  Family Communication  : Son and wife bedside on 11/11/2021, son 11/12/21, wife 11/13/21  Note patient's wife and son are not in agreement with patient's disposition.  We cannot intervene in family dynamics, once medically ready we will inform of discharge, PT to evaluate here.  Code Status : DNR  Consults  : Palliative care  PUD Prophylaxis : PPI   Procedures  :     CT - 1. Mild left hydroureter with associated periureteral fat stranding. Mild perivesicular fat stranding. No definite ureterolithiasis. Bilateral nonobstructive punctate nephrolithiasis.  Finding likely represents a recently passed stone. Differential diagnosis includes chronic changes of obstructive uropathy or reflux. Recommend correlation with urinalysis to evaluate for superimposed infection. 2. Interval development of an L1 compression fracture with greater than 25% vertebral body height loss. Correlate with point tenderness to palpation to evaluate for an acute abnormality. 3. Partially visualized large hiatal hernia containing the entire stomach. No findings of associated bowel obstruction. Other imaging findings of potential clinical significance: 1. Scattered colonic diverticulosis with no acute diverticulitis. 2. Diffuse decreased bone density with interval worsening of multilevel severe degenerative changes of the spine. 3. Distal infrarenal abdominal aorta aneurysm (3 cm). Recommend follow-up ultrasound every 3 years. This recommendation follows ACR consensus guidelines: White Paper of the ACR Incidental Findings Committee II on Vascular Findings.  TTE -  1. Left ventricular ejection fraction, by estimation, is 40 to 45%. The left ventricle has mildly decreased function. The left ventricle demonstrates regional wall motion abnormalities (see scoring diagram/findings for description). Left ventricular diastolic parameters are consistent with Grade II diastolic dysfunction (pseudonormalization).  2. Right ventricular systolic function is moderately reduced. The right ventricular size is not well visualized. There is moderately elevated pulmonary artery systolic pressure.  3. The mitral valve was not well visualized. Mild to moderate mitral valve regurgitation. Moderate mitral annular calcification.  4. Tricuspid valve regurgitation is moderate.  5. The aortic valve was not well visualized. Aortic valve regurgitation is not visualized      Disposition Plan  :    Status is: Inpatient  Remains inpatient appropriate because: Pyelonephritis, NSTEMI, A. fib RVR   DVT Prophylaxis  :     apixaban (ELIQUIS) tablet 2.5 mg Start: 11/13/21 1400 apixaban (ELIQUIS) tablet 2.5 mg    Lab Results  Component Value Date   PLT 233 11/13/2021    Diet :  Diet Order  Diet Heart Room service appropriate? Yes; Fluid consistency: Thin  Diet effective now                    Inpatient Medications  Scheduled Meds:  amiodarone  200 mg Oral BID   apixaban  2.5 mg Oral BID   aspirin  81 mg Oral Daily   atorvastatin  40 mg Oral Daily   fentaNYL (SUBLIMAZE) injection  50 mcg Intravenous Once   latanoprost  1 drop Both Eyes QHS   levothyroxine  50 mcg Oral Q0600   midodrine  10 mg Oral TID WC   pantoprazole  40 mg Oral Daily   Continuous Infusions:  cefTRIAXone (ROCEPHIN)  IV 2 g (11/13/21 0154)   heparin 900 Units/hr (11/12/21 1447)   PRN Meds:.acetaminophen **OR** acetaminophen, albuterol, nitroGLYCERIN, senna-docusate  Antibiotics  :    Anti-infectives (From admission, onward)    Start     Dose/Rate Route Frequency Ordered Stop   11/11/21 0200  cefTRIAXone (ROCEPHIN) 2 g in sodium chloride 0.9 % 100 mL IVPB        2 g 200 mL/hr over 30 Minutes Intravenous Every 24 hours 11/10/21 0814     11/10/21 1145  cefTRIAXone (ROCEPHIN) 2 g in sodium chloride 0.9 % 100 mL IVPB  Status:  Discontinued        2 g 200 mL/hr over 30 Minutes Intravenous Every 24 hours 11/10/21 1138 11/10/21 1145   11/10/21 0000  cefTRIAXone (ROCEPHIN) 2 g in sodium chloride 0.9 % 100 mL IVPB        2 g 200 mL/hr over 30 Minutes Intravenous  Once 11/09/21 2354 11/10/21 0115        Time Spent in minutes  30   Lala Lund M.D on 11/13/2021 at 10:04 AM  To page go to [email or web address]   [facility] -  Office  [phone]  See all Orders from today for further details    Objective:   Vitals:   11/13/21 0300 11/13/21 0349 11/13/21 0809 11/13/21 0955  BP: 110/60  (!) 84/25   Pulse: 79  83   Resp: 19  19   Temp: 97.7 F (36.5 C)  98.1 F (36.7 C)   TempSrc: Oral   Oral   SpO2: 100%  100% 100%  Weight:  70.5 kg    Height:        Wt Readings from Last 3 Encounters:  11/13/21 70.5 kg  04/15/21 66.4 kg  09/11/20 63.5 kg     Intake/Output Summary (Last 24 hours) at 11/13/2021 1004 Last data filed at 11/13/2021 0543 Gross per 24 hour  Intake 400 ml  Output 1300 ml  Net -900 ml     Physical Exam  Awake Alert, No new F.N deficits, Normal affect .AT,PERRAL Supple Neck, No JVD,   Symmetrical Chest wall movement, Good air movement bilaterally, no crackles but minimal upper airway wheezing RRR,No Gallops, Rubs or new Murmurs,  +ve B.Sounds, Abd Soft, No tenderness,   No Cyanosis, Clubbing or edema     Data Review:    CBC Recent Labs  Lab 11/09/21 1908 11/10/21 0830 11/10/21 1200 11/11/21 0536 11/12/21 0134 11/13/21 0159  WBC 20.5* 18.0* 16.8* 12.1* 12.4* 11.4*  HGB 14.1 10.8* 10.5* 10.3* 9.9* 10.3*  HCT 44.3 33.4* 34.0* 31.4* 30.3* 32.0*  PLT 315 235 221 206 223 233  MCV 100.7* 101.2* 102.4* 99.1 98.4 99.7  MCH 32.0 32.7 31.6 32.5 32.1 32.1  MCHC 31.8 32.3 30.9 32.8 32.7 32.2  RDW 13.6 13.7 13.7 13.8 14.0 14.1  LYMPHSABS 1.3 2.0  --  2.0 2.1 2.2  MONOABS 1.2* 1.5*  --  0.9 1.0 1.0  EOSABS 0.0 0.0  --  0.1 0.1 0.3  BASOSABS 0.1 0.1  --  0.1 0.1 0.1    Electrolytes Recent Labs  Lab 11/09/21 1908 11/09/21 2047 11/10/21 0038 11/10/21 0343 11/10/21 0641 11/10/21 0830 11/10/21 1200 11/11/21 0536 11/11/21 0951 11/12/21 0134 11/13/21 0159  NA 137  --   --   --   --  136 138 137  --  138 138  K 4.4  --   --   --   --  4.2 4.3 3.7  --  4.0 3.9  CL 101  --   --   --   --  104 107 107  --  105 102  CO2 25  --   --   --   --  24 24 22   --  23 26  GLUCOSE 171*  --   --   --   --  134* 133* 126*  --  132* 128*  BUN 35*  --   --   --   --  35* 33* 30*  --  28* 26*  CREATININE 2.47*  --   --   --   --  2.26* 2.30* 2.07*  --  2.17* 2.37*  CALCIUM 8.9  --   --   --   --  8.2* 8.3* 8.0*  --  8.2* 8.3*  AST 16  --   --   --    --   --  14*  --   --  21 18  ALT 12  --   --   --   --   --  10  --   --  12 12  ALKPHOS 86  --   --   --   --   --  59  --   --  52 57  BILITOT 1.0  --   --   --   --   --  0.7  --   --  0.5 0.3  ALBUMIN 3.5  --   --   --   --   --  2.5*  --   --  2.4* 2.4*  MG  --   --   --   --   --   --   --   --   --  1.9 2.0  LATICACIDVEN  --  2.5* 2.5* 3.0* 2.6*  --   --  1.2  --   --   --   TSH  --   --   --   --   --   --   --   --  3.305  --   --   BNP  --   --   --   --   --   --   --   --  532.2* 856.1* 551.9*    ------------------------------------------------------------------------------------------------------------------ No results for input(s): CHOL, HDL, LDLCALC, TRIG, CHOLHDL, LDLDIRECT in the last 72 hours.  Lab Results  Component Value Date   HGBA1C 6.0 (H) 05/13/2019    Recent Labs    11/11/21 0951  TSH 3.305   ------------------------------------------------------------------------------------------------------------------ ID Labs Recent Labs  Lab 11/09/21 2047 11/10/21 0038 11/10/21 0343 11/10/21 0641 11/10/21 0830 11/10/21 1200 11/11/21 0536 11/12/21 0134 11/13/21 0159  WBC  --   --   --   --  18.0* 16.8* 12.1* 12.4* 11.4*  PLT  --   --   --   --  235 221 206 223 233  LATICACIDVEN 2.5* 2.5* 3.0* 2.6*  --   --  1.2  --   --   CREATININE  --   --   --   --  2.26* 2.30* 2.07* 2.17* 2.37*       Micro Results Recent Results (from the past 240 hour(s))  Urine Culture     Status: Abnormal   Collection Time: 11/09/21  7:10 PM   Specimen: Urine, Clean Catch  Result Value Ref Range Status   Specimen Description URINE, CLEAN CATCH  Final   Special Requests   Final    NONE Performed at [facility], [place] [address] [place], [place] [address]    Culture MULTIPLE SPECIES PRESENT, SUGGEST RECOLLECTION (A)  Final   Report Status 11/11/2021 FINAL  Final  Culture, blood (single)     Status: None (Preliminary result)   Collection Time: 11/10/21 12:13 AM    Specimen: BLOOD  Result Value Ref Range Status   Specimen Description BLOOD RIGHT ANTECUBITAL  Final   Special Requests   Final    BOTTLES DRAWN AEROBIC AND ANAEROBIC Blood Culture results may not be optimal due to an inadequate volume of blood received in culture bottles   Culture   Final    NO GROWTH 2 DAYS Performed at [facility], [place] [address], [place], [place] [address]    Report Status PENDING  Incomplete  Culture, blood (routine x 2)     Status: None (Preliminary result)   Collection Time: 11/10/21  8:30 AM   Specimen: BLOOD  Result Value Ref Range Status   Specimen Description BLOOD LEFT ANTECUBITAL  Final   Special Requests   Final    BOTTLES DRAWN AEROBIC AND ANAEROBIC Blood Culture results may not be optimal due to an inadequate volume of blood received in culture bottles   Culture   Final    NO GROWTH 2 DAYS Performed at [facility], [place] [address], [place], [place] [address]    Report Status PENDING  Incomplete  Culture, blood (routine x 2)     Status: None (Preliminary result)   Collection Time: 11/10/21  8:46 AM   Specimen: BLOOD  Result Value Ref Range Status   Specimen Description BLOOD RIGHT ANTECUBITAL  Final   Special Requests   Final    BOTTLES DRAWN AEROBIC AND ANAEROBIC Blood Culture adequate volume   Culture   Final    NO GROWTH 2 DAYS Performed at [facility], [place] [address] [place], [place] [address]    Report Status PENDING  Incomplete    Radiology Reports CT ABDOMEN PELVIS WO CONTRAST  Result Date: 11/09/2021 CLINICAL DATA:  Abdominal pain, acute, nonlocalize. Sudden onset left lower quadrant and central abdominal pain. Labs will not allow dissection study initially. Rule out obstruction verse diverticulitis versus aorta versus bowel obstruction versus other EXAM: CT ABDOMEN AND PELVIS WITHOUT CONTRAST TECHNIQUE: Multidetector CT imaging of the abdomen and pelvis was performed following the standard protocol without IV  contrast. COMPARISON:  CT abdomen pelvis 01/12/2019 FINDINGS: Lower chest: Bronchial wall thickening of the right lower lobe. Four-vessel coronary calcifications. Partially visualized large hiatal hernia containing the entire stomach. Hepatobiliary: No focal liver abnormality. No gallstones, gallbladder wall thickening, or pericholecystic fluid. No biliary dilatation. Pancreas: No focal lesion. Normal pancreatic contour. No surrounding inflammatory changes. No main  pancreatic ductal dilatation. Spleen: Normal in size without focal abnormality. Adrenals/Urinary Tract: No adrenal nodule bilaterally. Bilateral punctate nephrolithiasis. No hydronephrosis. Fluid density lesions within the kidneys likely represent simple renal cysts. Subcentimeter hypodensities are too small to characterize. No ureterolithiasis. No right ureterolithiasis. Mild left hydroureter with associated fat stranding surrounding the ureter. Mild perivesicular fat stranding. Otherwise the urinary bladder is unremarkable. Possible TURP procedure surgical changes. Stomach/Bowel: Otherwise stomach is within normal limits. No pneumatosis. No evidence of bowel wall thickening or dilatation. Scattered colonic diverticulosis. Appendix appears normal. Vascular/Lymphatic: Distal intervened abdominal aorta measures at the upper limits of normal (3 cm). No iliac aneurysm. Mild atherosclerotic plaque of the aorta and its branches. No abdominal, pelvic, or inguinal lymphadenopathy. Reproductive: Prominent prostate. Other: No intraperitoneal free fluid. No intraperitoneal free gas. No organized fluid collection. Musculoskeletal: No abdominal wall hernia or abnormality. Diffusely decreased bone density. No suspicious lytic or blastic osseous lesions. Interval development of an L1 compression fracture with greater than 25% vertebral body height loss. Interval worsening of multilevel severe degenerative changes of the spine. IMPRESSION: 1. Mild left hydroureter with  associated periureteral fat stranding. Mild perivesicular fat stranding. No definite ureterolithiasis. Bilateral nonobstructive punctate nephrolithiasis. Finding likely represents a recently passed stone. Differential diagnosis includes chronic changes of obstructive uropathy or reflux. Recommend correlation with urinalysis to evaluate for superimposed infection. 2. Interval development of an L1 compression fracture with greater than 25% vertebral body height loss. Correlate with point tenderness to palpation to evaluate for an acute abnormality. 3. Partially visualized large hiatal hernia containing the entire stomach. No findings of associated bowel obstruction. Other imaging findings of potential clinical significance: 1. Scattered colonic diverticulosis with no acute diverticulitis. 2. Diffuse decreased bone density with interval worsening of multilevel severe degenerative changes of the spine. 3. Distal infrarenal abdominal aorta aneurysm (3 cm). Recommend follow-up ultrasound every 3 years. This recommendation follows ACR consensus guidelines: White Paper of the ACR Incidental Findings Committee II on Vascular Findings. J Am Coll Radiol 2013; 59:977-414. 4. Aortic Atherosclerosis (ICD10-I70.0). Aortic aneurysm NOS (ICD10-I71.9). Electronically Signed   By: Iven Finn M.D.   On: 11/09/2021 23:33   DG Chest Port 1 View  Result Date: 11/12/2021 CLINICAL DATA:  Shortness of breath with wheezing and cough. EXAM: PORTABLE CHEST 1 VIEW COMPARISON:  11/11/2021 FINDINGS: 0809 hours. Low volumes. Bibasilar collapse/consolidation with small bilateral pleural effusions. The cardio pericardial silhouette is enlarged. The visualized bony structures of the thorax show no acute abnormality. Telemetry leads overlie the chest. IMPRESSION: Mild progression of bibasilar collapse/consolidation with small bilateral pleural effusions. Electronically Signed   By: Misty Stanley M.D.   On: 11/12/2021 08:31   DG Chest Port 1  View  Result Date: 11/11/2021 CLINICAL DATA:  Shortness of breath EXAM: PORTABLE CHEST 1 VIEW COMPARISON:  04/13/2021 FINDINGS: Heart is normal size. Bibasilar airspace opacities are similar prior study. Hiatal hernia noted. No effusions or acute bony abnormality. IMPRESSION: Chronic bibasilar opacities, similar prior study. Favor scarring or atelectasis. Infiltrate cannot be completely excluded at the left lung base. Moderate-sized hiatal hernia. Electronically Signed   By: Rolm Baptise M.D.   On: 11/11/2021 10:02   ECHOCARDIOGRAM COMPLETE  Result Date: 11/11/2021    ECHOCARDIOGRAM REPORT   Patient Name:   [name] Date of Exam: 11/11/2021 Medical Rec #:  [id number]         Height:       65.0 in Accession #:    [id number]  Weight:       154.3 lb Date of Birth:  10-Feb-1918        BSA:          1.772 m Patient Age:    89 years         BP:           99/45 mmHg Patient Gender: M                 HR:           86 bpm. Exam Location:  Inpatient Procedure: 2D Echo, Cardiac Doppler, Color Doppler and Intracardiac            Opacification Agent                        STAT ECHO Reported to: Dr Lala Lund on 11/11/2021 2:00:00 PM               New WMA and elevated RVSP. Indications:    NSTEMI I21.4  History:        Patient has prior history of Echocardiogram examinations, most                 recent 04/14/2021. CHF, Arrythmias:Atrial Fibrillation,                 Tachycardia and Mildly prolonged QTC; Risk Factors:Dyslipidemia                 and Hypertension. Hypothyroidism.  Sonographer:    Darlina Sicilian RDCS Referring Phys: Arnell Asal Margaree Mackintosh [place]  1. Left ventricular ejection fraction, by estimation, is 40 to 45%. The left ventricle has mildly decreased function. The left ventricle demonstrates regional wall motion abnormalities (see scoring diagram/findings for description). Left ventricular diastolic parameters are consistent with Grade II diastolic dysfunction (pseudonormalization).   2. Right ventricular systolic function is moderately reduced. The right ventricular size is not well visualized. There is moderately elevated pulmonary artery systolic pressure.  3. The mitral valve was not well visualized. Mild to moderate mitral valve regurgitation. Moderate mitral annular calcification.  4. Tricuspid valve regurgitation is moderate.  5. The aortic valve was not well visualized. Aortic valve regurgitation is not visualized. Comparison(s): Changes from prior study are noted. Conclusion(s)/Recommendation(s): Even with use of echo contrast, very difficult images. There appears to be inferior/lateral LV wall motion abnormalities with mildly decreased LV function. The RV is not well seen, but function appears likely moderately reduced, and there is elevated RVSP. FINDINGS  Left Ventricle: Left ventricular ejection fraction, by estimation, is 40 to 45%. The left ventricle has mildly decreased function. The left ventricle demonstrates regional wall motion abnormalities. Definity contrast agent was given IV to delineate the left ventricular endocardial borders. The left ventricular internal cavity size was normal in size. There is no left ventricular hypertrophy. Left ventricular diastolic parameters are consistent with Grade II diastolic dysfunction (pseudonormalization).  LV Wall Scoring: The antero-lateral wall, inferior wall, and posterior wall are hypokinetic. The entire septum, entire apex, and basal anterior segment are normal. Right Ventricle: The right ventricular size is not well visualized. Right vetricular wall thickness was not well visualized. Right ventricular systolic function is moderately reduced. There is moderately elevated pulmonary artery systolic pressure. The tricuspid regurgitant velocity is 3.44 m/s, and with an assumed right atrial pressure of 8 mmHg, the estimated right ventricular systolic pressure is 93.2 mmHg. Left Atrium: Left atrial size was normal in size. Right Atrium:  Right atrial  size was normal in size. Pericardium: There is no evidence of pericardial effusion. Mitral Valve: The mitral valve was not well visualized. Moderate mitral annular calcification. Mild to moderate mitral valve regurgitation. Tricuspid Valve: The tricuspid valve is grossly normal. Tricuspid valve regurgitation is moderate. Aortic Valve: The aortic valve was not well visualized. Aortic valve regurgitation is not visualized. Pulmonic Valve: The pulmonic valve was not well visualized. Pulmonic valve regurgitation is not visualized. Aorta: The ascending aorta was not well visualized, the aortic arch was not well visualized and the aortic root is normal in size and structure. IAS/Shunts: The interatrial septum was not well visualized.  LEFT VENTRICLE PLAX 2D LVIDd:         4.80 cm      Diastology LVIDs:         3.50 cm      LV e' medial:    4.24 cm/s LV PW:         0.80 cm      LV E/e' medial:  30.9 LV IVS:        0.80 cm      LV e' lateral:   7.83 cm/s LVOT diam:     1.80 cm      LV E/e' lateral: 16.7 LV SV:         28 LV SV Index:   16 LVOT Area:     2.54 cm  LV Volumes (MOD) LV vol d, MOD A2C: 120.0 ml LV vol d, MOD A4C: 71.0 ml LV vol s, MOD A2C: 77.3 ml LV vol s, MOD A4C: 39.9 ml LV SV MOD A2C:     42.7 ml LV SV MOD A4C:     71.0 ml LV SV MOD BP:      37.8 ml RIGHT VENTRICLE RV S prime:     5.87 cm/s TAPSE (M-mode): 1.0 cm LEFT ATRIUM             Index        RIGHT ATRIUM           Index LA diam:        3.80 cm 2.14 cm/m   RA Area:     11.10 cm LA Vol (A2C):   22.3 ml 12.59 ml/m  RA Volume:   22.00 ml  12.42 ml/m LA Vol (A4C):   31.2 ml 17.61 ml/m LA Biplane Vol: 28.4 ml 16.03 ml/m  AORTIC VALVE LVOT Vmax:   61.50 cm/s LVOT Vmean:  42.700 cm/s LVOT VTI:    0.110 m  AORTA Ao Root diam: 3.20 cm MITRAL VALVE                  TRICUSPID VALVE MV Area (PHT): 3.65 cm       TR Peak grad:   47.3 mmHg MV Decel Time: 208 msec       TR Vmax:        344.00 cm/s MR Peak grad:    78.5 mmHg MR Mean grad:    54.0  mmHg    SHUNTS MR Vmax:         443.00 cm/s  Systemic VTI:  0.11 m MR Vmean:        353.0 cm/s   Systemic Diam: 1.80 cm MR PISA:         0.57 cm MR PISA Eff ROA: 4 mm MR PISA Radius:  0.30 cm MV E velocity: 131.00 cm/s MV A velocity: 68.70 cm/s MV E/A ratio:  1.91 Buford Dresser MD Electronically  signed by Buford Dresser MD Signature Date/Time: 11/11/2021/2:01:00 PM    Final

## 2021-11-13 NOTE — Evaluation (Signed)
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 PMH includes CVA, HTN, CHF, HLD, glaucoma, dementia, L eye blindness.   Clinical Impression  Patient evaluated by Physical Therapy with no further acute PT needs identified. PTA, pt lives with spouse, has PCA assist 104 hrs/wk as well as spouse assist for mobility and ADLs; pt transfers to assist with w/c 1x/day, performs bed-level ADLs. Today, pt required maxA+1 for stand pivot from recliner to bed; DOE 3/4 and fatigue with minimal activity. All education has been completed and the patient has no further questions. Pt is essentially at baseline, both PT/OT and pt's souse agree there are no post-acute rehab needs. PT is signing off. Thank you for this referral.   Recommendations for follow up therapy are one component of a multi-disciplinary discharge planning process, led by the attending physician.  Recommendations may be updated based on patient status, additional functional criteria and insurance authorization.  Follow Up Recommendations No PT follow up    Assistance Recommended at Discharge Frequent or constant Supervision/Assistance  Functional Status Assessment Patient has not had a recent decline in their functional status  Equipment Recommendations  None recommended by PT    Recommendations for Other Services       Precautions / Restrictions Precautions Precautions: Fall;Other (comment) Precaution Comments: residual R hemiparesis      Mobility  Bed Mobility Overal bed mobility: Needs Assistance Bed Mobility: Sit to Supine     Supine to sit: Mod assist;HOB elevated Sit to supine: Max assist   General bed mobility comments: MaxA for trunk support and BLE management     Transfers Overall transfer level: Needs assistance Equipment used: 1 person hand held assist Transfers: Sit to/from Stand;Bed to chair/wheelchair/BSC Sit to Stand: Max assist Stand pivot transfers: Max assist   Squat pivot transfers: Max assist     General transfer comment: MaxA for stand pivot from recliner to bed with bilateral HHA, pt attempting to shuffle feet for pivotal steps, able to do so very minimally    Ambulation/Gait                  Stairs            Wheelchair Mobility    Modified Rankin (Stroke Patients Only)       Balance Overall balance assessment: Needs assistance Sitting-balance support: Feet supported;Single extremity supported Sitting balance-Leahy Scale: Poor Sitting balance - Comments: reliant on UE support to maintain static sitting at EOB   Standing balance support: Bilateral upper extremity supported Standing balance-Leahy Scale: Zero Standing balance comment: Reliant on maxA to maintain static standing                             Pertinent Vitals/Pain Pain Assessment: Faces Faces Pain Scale: Hurts a little bit Pain Location: buttocks Pain Descriptors / Indicators: Sore Pain Intervention(s): Monitored during session;Repositioned    Home Living Family/patient expects to be discharged to:: Private residence Living Arrangements: Spouse/significant other Available Help at Discharge: Family;Personal care attendant;Available 24 hours/day Type of Home: House Home Access: Stairs to enter   Entrance Stairs-Number of Steps: 5 Alternate Level Stairs-Number of Steps: flight with chair lift, but he does not go upstairs anymore Home Layout: Two level;Able to live on main level  with bedroom/bathroom Home Equipment: Wheelchair - manual;Hospital bed Additional Comments: Lives at home with wife; has caregivers from 9:30p-3:00p, and now has somone that stays at night as well. Has worked with [facility] in past, discharged due to  limited mobility    Prior Function Prior Level of Function : Needs assist  Cognitive Assist : Mobility (cognitive);ADLs (cognitive) Mobility (Cognitive): Step by step cues ADLs (Cognitive): Step by step cues Physical Assist : Mobility (physical);ADLs (physical) Mobility (physical): Bed mobility;Transfers ADLs (physical): Bathing;Dressing;Toileting;IADLs Mobility Comments: Assist to transfer to wheelchair for ~3 hrs 1x/day ADLs Comments: Bed-level ADLs, including bathing and bedpain     Hand Dominance   Dominant Hand: Right    Extremity/Trunk Assessment   Upper Extremity Assessment Upper Extremity Assessment: Generalized weakness;RUE deficits/detail RUE Deficits / Details: h/o stroke with residual weakness    Lower Extremity Assessment Lower Extremity Assessment: Generalized weakness;RLE deficits/detail RLE Deficits / Details: h/o stroke with residual weakness    Cervical / Trunk Assessment Cervical / Trunk Assessment: Kyphotic  Communication   Communication: HOH  Cognition Arousal/Alertness: Awake/alert Behavior During Therapy: WFL for tasks assessed/performed Overall Cognitive Status: History of cognitive impairments - at baseline                                 General Comments: Pleasant and following simple commands well. H/o dementia        General Comments General comments (skin integrity, edema, etc.): Pt's wife present and supportive - wife in agreement that pt near baseline mobility, does not require HHPT services reinitiated (wife reports pt d/c from prior HHPT services, stating "They said they cannot do anything else for him")    Exercises     Assessment/Plan    PT Assessment Patient does not need any further PT services  PT Problem List         PT Treatment Interventions      PT Goals (Current goals can be found in the Care Plan section)  Acute Rehab PT Goals PT Goal Formulation: All assessment and education complete, DC therapy     Frequency     Barriers to discharge        Co-evaluation               AM-PAC PT "6 Clicks" Mobility  Outcome Measure Help needed turning from your back to your side while in a flat bed without using bedrails?: Total Help needed moving from lying on your back to sitting on the side of a flat bed without using bedrails?: Total Help needed moving to and from a bed to a chair (including a wheelchair)?: Total Help needed standing up from a chair using your arms (e.g., wheelchair or bedside chair)?: Total Help needed to walk in hospital room?: Total Help needed climbing 3-5 steps with a railing? : Total 6 Click Score: 6    End of Session Equipment Utilized During Treatment: Gait belt Activity Tolerance: Patient tolerated treatment well;Patient limited by fatigue Patient left: in bed;with call bell/phone within reach;with bed alarm set Nurse Communication: Mobility status PT Visit Diagnosis: Other abnormalities of gait and mobility (R26.89);Muscle weakness (generalized) (M62.81)    Time: 8469-6295 PT Time Calculation (min) (ACUTE ONLY): 22 min   Charges:   PT Evaluation $PT Eval Moderate Complexity: Hinds, PT, DPT Acute Rehabilitation Services  Pager [phone] Office [place] 11/13/2021, 1:29 PM

## 2021-11-14 ENCOUNTER — Inpatient Hospital Stay (HOSPITAL_COMMUNITY): Payer: Medicare Other

## 2021-11-14 DIAGNOSIS — N12 Tubulo-interstitial nephritis, not specified as acute or chronic: Secondary | ICD-10-CM | POA: Diagnosis not present

## 2021-11-14 LAB — COMPREHENSIVE METABOLIC PANEL
ALT: 13 U/L (ref 0–44)
AST: 15 U/L (ref 15–41)
Albumin: 2.4 g/dL — ABNORMAL LOW (ref 3.5–5.0)
Alkaline Phosphatase: 61 U/L (ref 38–126)
Anion gap: 10 (ref 5–15)
BUN: 26 mg/dL — ABNORMAL HIGH (ref 8–23)
CO2: 25 mmol/L (ref 22–32)
Calcium: 8.3 mg/dL — ABNORMAL LOW (ref 8.9–10.3)
Chloride: 103 mmol/L (ref 98–111)
Creatinine, Ser: 2.43 mg/dL — ABNORMAL HIGH (ref 0.61–1.24)
GFR, Estimated: 23 mL/min — ABNORMAL LOW (ref >60)
Glucose, Bld: 113 mg/dL — ABNORMAL HIGH (ref 70–99)
Potassium: 4.1 mmol/L (ref 3.5–5.1)
Sodium: 138 mmol/L (ref 135–145)
Total Bilirubin: 0.3 mg/dL (ref 0.3–1.2)
Total Protein: 5 g/dL — ABNORMAL LOW (ref 6.5–8.1)

## 2021-11-14 LAB — BRAIN NATRIURETIC PEPTIDE: B Natriuretic Peptide: 664.4 pg/mL — ABNORMAL HIGH (ref 0.0–100.0)

## 2021-11-14 LAB — CBC WITH DIFFERENTIAL/PLATELET
Abs Immature Granulocytes: 0.02 10*3/uL (ref 0.00–0.07)
Basophils Absolute: 0.1 10*3/uL (ref 0.0–0.1)
Basophils Relative: 1 %
Eosinophils Absolute: 0.2 10*3/uL (ref 0.0–0.5)
Eosinophils Relative: 3 %
HCT: 31.5 % — ABNORMAL LOW (ref 39.0–52.0)
Hemoglobin: 10.3 g/dL — ABNORMAL LOW (ref 13.0–17.0)
Immature Granulocytes: 0 %
Lymphocytes Relative: 20 %
Lymphs Abs: 1.7 10*3/uL (ref 0.7–4.0)
MCH: 32.5 pg (ref 26.0–34.0)
MCHC: 32.7 g/dL (ref 30.0–36.0)
MCV: 99.4 fL (ref 80.0–100.0)
Monocytes Absolute: 0.8 10*3/uL (ref 0.1–1.0)
Monocytes Relative: 10 %
Neutro Abs: 5.8 10*3/uL (ref 1.7–7.7)
Neutrophils Relative %: 66 %
Platelets: 216 10*3/uL (ref 150–400)
RBC: 3.17 MIL/uL — ABNORMAL LOW (ref 4.22–5.81)
RDW: 14.2 % (ref 11.5–15.5)
WBC: 8.6 10*3/uL (ref 4.0–10.5)
nRBC: 0 % (ref 0.0–0.2)

## 2021-11-14 LAB — MAGNESIUM: Magnesium: 1.9 mg/dL (ref 1.7–2.4)

## 2021-11-14 MED ORDER — ALBUTEROL SULFATE (2.5 MG/3ML) 0.083% IN NEBU
2.5000 mg | INHALATION_SOLUTION | Freq: Three times a day (TID) | RESPIRATORY_TRACT | Status: DC
  Administered 2021-11-14 – 2021-11-15 (×4): 2.5 mg via RESPIRATORY_TRACT
  Filled 2021-11-14 (×3): qty 3

## 2021-11-14 MED ORDER — FUROSEMIDE 40 MG PO TABS
40.0000 mg | ORAL_TABLET | Freq: Once | ORAL | Status: AC
  Administered 2021-11-14: 11:00:00 40 mg via ORAL
  Filled 2021-11-14: qty 1

## 2021-11-14 MED ORDER — LACTATED RINGERS IV SOLN: INTRAVENOUS | Status: DC

## 2021-11-14 MED ORDER — ALBUTEROL SULFATE HFA 108 (90 BASE) MCG/ACT IN AERS: 2.0000 | INHALATION_SPRAY | Freq: Four times a day (QID) | RESPIRATORY_TRACT | Status: DC | PRN

## 2021-11-14 MED ORDER — ALBUTEROL SULFATE (2.5 MG/3ML) 0.083% IN NEBU: 2.5000 mg | INHALATION_SOLUTION | Freq: Four times a day (QID) | RESPIRATORY_TRACT | Status: DC | PRN

## 2021-11-14 NOTE — TOC Initial Note (Signed)
Transition of Care [facility]) - Initial/Assessment Note    Patient Details  Name: Robert Rowe MRN: [id number] Date of Birth: 1918/11/13  Transition of [facility] CM/SW Contact:    Carles Collet, RN Phone Number: 11/14/2021, 2:03 PM  Clinical Narrative:    Spoke with patient and wife (of 46 years) at bedside to discuss discharge planning needs and assess current home support. Patient's wife states that they have private duty care aids in the home 104 hours a week with the ability to add an additional 8 hours on the weekends as needed. The same aid has been working for them for th epast 3 years and has a very good relationship with the patient. They deny any recent falls at home and have a WC and a RW.  We discussed additional home support and [facility] services, patient and spouse are agreeable for outpatient palliative services to follow and referral has been made to [facility]. TOC will continue to follow for potential home oxygen needs.  Patient's wife is primary contact and wishes to be sole contact for discharge planning.        [place], [place] Spouse [phone]  [phone]            Expected Discharge Plan: Home/Self Care Barriers to Discharge: Continued Medical Work up   Patient Goals and CMS Choice Patient states their goals for this hospitalization and ongoing recovery are:: to return home      Expected Discharge Plan and Services Expected Discharge Plan: Home/Self Care   Discharge Planning Services: CM Consult   Living arrangements for the past 2 months: Single Family Home                                      Prior Living Arrangements/Services Living arrangements for the past 2 months: Single Family Home Lives with:: Spouse                   Activities of Daily Living Home Assistive Devices/Equipment: None ADL Screening (condition at time of admission) Patient's cognitive ability adequate to safely complete daily activities?: No Is the patient deaf  or have difficulty hearing?: Yes Does the patient have difficulty seeing, even when wearing glasses/contacts?: Yes Does the patient have difficulty concentrating, remembering, or making decisions?: Yes Patient able to express need for assistance with ADLs?: Yes Does the patient have difficulty dressing or bathing?: Yes Independently performs ADLs?: No Communication: Dependent Is this a change from baseline?: Pre-admission baseline Dressing (OT): Dependent Is this a change from baseline?: Pre-admission baseline Grooming: Dependent Is this a change from baseline?: Pre-admission baseline Bathing: Dependent Is this a change from baseline?: Pre-admission baseline Toileting: Dependent Is this a change from baseline?: Pre-admission baseline In/Out Bed: Dependent Is this a change from baseline?: Pre-admission baseline Walks in Home: Dependent Is this a change from baseline?: Pre-admission baseline Does the patient have difficulty walking or climbing stairs?: Yes Weakness of Legs: Both Weakness of Arms/Hands: Both  Permission Sought/Granted                  Emotional Assessment              Admission diagnosis:  Pyelonephritis [N12] Left lower quadrant abdominal pain [R10.32] Patient Active Problem List   Diagnosis Date Noted   Pyelonephritis 11/10/2021   Sepsis ([place]) 11/10/2021   Acute kidney injury superimposed on CKD ([place]) 11/10/2021   Prolonged QT interval 11/10/2021  History of CVA (cerebrovascular accident) 11/10/2021   Palliative care by specialist    DNR (do not resuscitate) discussion    Acute cholecystitis 01/14/2019   Cholecystitis    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   Gait disturbance, post-stroke    BPV (benign positional vertigo) 01/02/2019   History of stroke 01/01/2019   TIA (transient ischemic attack) 12/31/2018   CVA (cerebral vascular accident) ([place]) 12/31/2018   Chest pain 05/15/2018   CKD (chronic kidney disease) stage 3, GFR 30-59  ml/min ([place]) 05/08/2018   Weakness generalized 06/13/2017   Abnormal diffusion capacity determined by pulmonary function test 06/13/2017   Chronic diastolic CHF (congestive heart failure) ([place]) 11/01/2016   Essential hypertension 11/01/2016   Intracranial vascular stenosis 11/09/2015   Mild cognitive impairment 11/09/2015   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   Benign prostatic hyperplasia with urinary obstruction 03/19/2015   Awareness alteration, transient 02/26/2015   PVC's (premature ventricular contractions) 08/22/2013   Hypothyroidism 05/09/2013   Stricture and stenosis of esophagus 03/12/2013   Dysphagia 02/22/2013   DOE (dyspnea on exertion) 03/23/2011   PALPITATIONS 02/12/2010   CHEST DISCOMFORT 04/25/2009   PEDAL EDEMA 05/01/2008   GERD 12/03/2007   Dyslipidemia 04/05/2007   BENIGN PROSTATIC HYPERTROPHY 04/05/2007   Rheumatoid arthritis ([place]) 04/05/2007   Osteoarthritis 04/05/2007   History of cardiovascular disorder 04/05/2007   PCP:  [name], MD Pharmacy:   [place], [place] - [address] [place] [address] Phone: [phone] Fax: [phone]  [id number] - [place], [place] [address] [place] [address] Phone: [phone] Fax: [phone]  [facility] - [place], [place] - [address] [place] [address] Phone: [phone] Fax: [phone]     Social Determinants of Health (SDOH) Interventions    Readmission Risk Interventions No flowsheet data found.

## 2021-11-14 NOTE — Progress Notes (Signed)
Council for heparin transition to Eliquis Indication: atrial fibrillation  Allergies  Allergen Reactions   Codeine Other (See Comments)    Patient was "all over the place"- undesired side effect   Sulfasalazine Other (See Comments)    Reaction not recalled   Sulfonamide Derivatives Other (See Comments)    Reaction not recalled    Patient Measurements: Height: 5\' 5"  (165.1 cm) Weight: 70.4 kg (155 lb 3.3 oz) IBW/kg (Calculated) : 61.5 Heparin Dosing Weight: 72.6 kg  Vital Signs: Temp: 98.3 F (36.8 C) (12/25 0821) Temp Source: Oral (12/25 0821) BP: 97/51 (12/25 0821) Pulse Rate: 76 (12/25 0821)  Labs: Recent Labs    11/11/21 0951 11/11/21 1353 11/12/21 0134 11/12/21 0134 11/12/21 0900 11/13/21 0159 11/14/21 0018  HGB  --   --  9.9*   < >  --  10.3* 10.3*  HCT  --   --  30.3*  --   --  32.0* 31.5*  PLT  --   --  223  --   --  233 216  APTT  --  43*  --   --   --   --   --   HEPARINUNFRC  --   --  0.69  --  0.60 0.40  --   CREATININE  --   --  2.17*  --   --  2.37* 2.43*  TROPONINIHS 760*  --   --   --   --   --   --    < > = values in this interval not displayed.     Estimated Creatinine Clearance: 13.7 mL/min (A) (by C-G formula based on SCr of 2.43 mg/dL (H)).   Assessment: 87 yo M with A fib RVR in setting of NSTEMI. Not on anticoagulation prior to admission. Pharmacy consulted for heparin dosing for 48 hours - started 12/22 1500.   Transitioned 12/24 to apixaban 2.5 mg BID 12/24 1400 for afib. Age 87, Scr 2.43, Wt 70.4 kg. CBC stable, no s/sx of bleeding noted.   Goal of Therapy:  Monitor platelets by anticoagulation protocol: Yes   Plan:  Continue Eliquis 2.5 mg BID CTM any s/sx of bleeding and daily CBC.  Joseph Art, Pharm.D. PGY-1 Pharmacy Resident [phone] 11/14/2021 9:48 AM

## 2021-11-14 NOTE — Progress Notes (Signed)
PROGRESS NOTE                                                                                                                                                                                                             Patient Demographics:    Robert Rowe, is a 87 y.o. 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 lower abdominal discomfort in the hospital he was diagnosed with pyelonephritis on the left side due to a past left ureteric stone.  He also had sepsis and subsequently went into A. fib RVR.  He came into my care early morning 11/11/2021 at 7 AM.   Subjective:   Patient in bed, appears comfortable, denies any headache, no fever, no chest pain or pressure, no shortness of breath, mild wheezing , no abdominal pain. No new focal weakness.   Assessment  & Plan :     Sepsis due to left-sided pyelonephritis caused by a passed left ureteral stone - he been placed on IV fluids along with empiric IV antibiotics, cultures so far negative, sepsis pathophysiology is improving, continue antibiotics stone likely has passed as per CT scan.  2.  Paroxysmal A. fib RVR with NSTEMI, [place] vas 2 score of greater than 5.  He had sustained tachycardia night of 11/10/2021 due to A. fib RVR, he was also hypotensive, when I came to see him this morning he was profoundly hypotensive, tachycardic and had wheezes and crackles.  I think due to sustained tachycardia he had a NSTEMI, troponin was ++ elevated and so was his BNP, echocardiogram noted  with EF now dropped from 60% to 45% with wall motion abnormality, heparin drip for total of 48 hours then switch to Eliquis for underlying A. fib, Plavix stopped for now placed on baby aspirin, blood pressure too low for beta-blocker, continue statin for secondary prevention.  RVR in good control and switched from IV amiodarone drip to oral amiodarone.  3. HX of stroke with right-sided weakness.  We will stop Plavix and switch him to heparin drip, once he is better and close to discharge on Eliquis.  4.  Dyslipidemia.  On statin.  5.  Acute on chronic CHF.  See #2 above.  Echo being done.  Would be interested in evaluating for wall motion and particularly his RV.  6.  GERD.  PPI.  7.  Hypothyroidism.  On Synthroid.  Stable TSH.  8.  Mildly prolonged QTC.  Give magnesium monitor electrolytes.  When blood pressure improves low-dose beta-blocker.  9.  Acute systolic and diastolic CHF kindly see #2 above - EF 45% .  Gentle diuresis as tolerated by blood pressure, Lasix on 11/14/21.  Midodrine to augment blood pressure.  Cannot use B-Blocker, ACE and ARB for underlying CKD  and hypertension.  10.  AKI on CKD 4.  Baseline creatinine around 1.8. Monitor with as needed diuresis.  Overall extremely tenuous due to sepsis, pyelonephritis, A. fib RVR and NSTEMI with hypotension.  Continue medical treatment, DNR, if declines further then comfort measures.       Condition - Extremely Guarded  Family Communication  : Son and wife bedside on 11/11/2021, son 11/12/21, wife 11/13/21  Note patient's wife and son are not in agreement with patient's disposition.  We cannot intervene in family dynamics, once medically ready we will inform of discharge, PT to evaluate here.  Code Status : DNR  Consults  : Palliative care  PUD Prophylaxis : PPI   Procedures  :     CT - 1. Mild left hydroureter with associated periureteral fat stranding. Mild perivesicular fat stranding. No definite ureterolithiasis.  Bilateral nonobstructive punctate nephrolithiasis. Finding likely represents a recently passed stone. Differential diagnosis includes chronic changes of obstructive uropathy or reflux. Recommend correlation with urinalysis to evaluate for superimposed infection. 2. Interval development of an L1 compression fracture with greater than 25% vertebral body height loss. Correlate with point tenderness to palpation to evaluate for an acute abnormality. 3. Partially visualized large hiatal hernia containing the entire stomach. No findings of associated bowel obstruction. Other imaging findings of potential clinical significance: 1. Scattered colonic diverticulosis with no acute diverticulitis. 2. Diffuse decreased bone density with interval worsening of multilevel severe degenerative changes of the spine. 3. Distal infrarenal abdominal aorta aneurysm (3 cm). Recommend follow-up ultrasound every 3 years. This recommendation follows ACR consensus guidelines: White Paper of the ACR Incidental Findings Committee II on Vascular Findings.  TTE -  1. Left ventricular ejection fraction, by estimation, is 40 to 45%. The left ventricle has mildly decreased function. The left ventricle demonstrates regional wall motion abnormalities (see scoring diagram/findings for description). Left ventricular diastolic parameters are consistent with Grade II diastolic dysfunction (pseudonormalization).  2. Right ventricular systolic function is moderately reduced. The right ventricular size is not well visualized. There is moderately elevated pulmonary artery systolic pressure.  3. The mitral valve was not well visualized. Mild to moderate mitral valve regurgitation. Moderate mitral annular calcification.  4. Tricuspid valve regurgitation is moderate.  5. The aortic valve was not well visualized. Aortic valve regurgitation is not visualized      Disposition Plan  :    Status is: Inpatient  Remains inpatient appropriate because:  Pyelonephritis, NSTEMI, A. fib RVR   DVT Prophylaxis  :    apixaban (ELIQUIS) tablet 2.5 mg Start: 11/13/21 1400 apixaban (ELIQUIS) tablet 2.5 mg    Lab Results  Component Value Date   PLT 216 11/14/2021  Diet :  Diet Order             Diet Heart Room service appropriate? Yes; Fluid consistency: Thin  Diet effective now                    Inpatient Medications  Scheduled Meds:  albuterol  2.5 mg Nebulization TID   amiodarone  200 mg Oral BID   apixaban  2.5 mg Oral BID   aspirin  81 mg Oral Daily   atorvastatin  40 mg Oral Daily   fentaNYL (SUBLIMAZE) injection  50 mcg Intravenous Once   furosemide  40 mg Oral Once   latanoprost  1 drop Both Eyes QHS   levothyroxine  50 mcg Oral Q0600   midodrine  10 mg Oral TID WC   pantoprazole  40 mg Oral Daily   Continuous Infusions:  cefTRIAXone (ROCEPHIN)  IV Stopped (11/14/21 0251)   lactated ringers Stopped (11/14/21 0910)   PRN Meds:.acetaminophen **OR** acetaminophen, albuterol, nitroGLYCERIN, senna-docusate  Antibiotics  :    Anti-infectives (From admission, onward)    Start     Dose/Rate Route Frequency Ordered Stop   11/11/21 0200  cefTRIAXone (ROCEPHIN) 2 g in sodium chloride 0.9 % 100 mL IVPB        2 g 200 mL/hr over 30 Minutes Intravenous Every 24 hours 11/10/21 0814     11/10/21 1145  cefTRIAXone (ROCEPHIN) 2 g in sodium chloride 0.9 % 100 mL IVPB  Status:  Discontinued        2 g 200 mL/hr over 30 Minutes Intravenous Every 24 hours 11/10/21 1138 11/10/21 1145   11/10/21 0000  cefTRIAXone (ROCEPHIN) 2 g in sodium chloride 0.9 % 100 mL IVPB        2 g 200 mL/hr over 30 Minutes Intravenous  Once 11/09/21 2354 11/10/21 0115        Time Spent in minutes  30   Lala Lund M.D on 11/14/2021 at 10:18 AM  To page go to [email or web address]   [facility] -  Office  [phone]  See all Orders from today for further details    Objective:   Vitals:   11/14/21 0403 11/14/21 0500 11/14/21 0810  11/14/21 0821  BP: 122/63   (!) 97/51  Pulse: 72   76  Resp: (!) 21   20  Temp: 98 F (36.7 C)   98.3 F (36.8 C)  TempSrc: Oral   Oral  SpO2: 100%  100% 100%  Weight:  70.4 kg    Height:        Wt Readings from Last 3 Encounters:  11/14/21 70.4 kg  04/15/21 66.4 kg  09/11/20 63.5 kg     Intake/Output Summary (Last 24 hours) at 11/14/2021 1018 Last data filed at 11/14/2021 1000 Gross per 24 hour  Intake 824.36 ml  Output 550 ml  Net 274.36 ml     Physical Exam  Awake Alert, No new F.N deficits, Normal affect [place].AT,PERRAL Supple Neck, No JVD,   Symmetrical Chest wall movement, Good air movement bilaterally, mild wheezing and fine rales RRR,No Gallops, Rubs or new Murmurs,  +ve B.Sounds, Abd Soft, No tenderness,   No Cyanosis, Clubbing or edema    Data Review:    CBC Recent Labs  Lab 11/10/21 0830 11/10/21 1200 11/11/21 0536 11/12/21 0134 11/13/21 0159 11/14/21 0018  WBC 18.0* 16.8* 12.1* 12.4* 11.4* 8.6  HGB 10.8* 10.5* 10.3* 9.9* 10.3* 10.3*  HCT 33.4* 34.0* 31.4* 30.3* 32.0* 31.5*  PLT  235 221 206 223 233 216  MCV 101.2* 102.4* 99.1 98.4 99.7 99.4  MCH 32.7 31.6 32.5 32.1 32.1 32.5  MCHC 32.3 30.9 32.8 32.7 32.2 32.7  RDW 13.7 13.7 13.8 14.0 14.1 14.2  LYMPHSABS 2.0  --  2.0 2.1 2.2 1.7  MONOABS 1.5*  --  0.9 1.0 1.0 0.8  EOSABS 0.0  --  0.1 0.1 0.3 0.2  BASOSABS 0.1  --  0.1 0.1 0.1 0.1    Electrolytes Recent Labs  Lab 11/09/21 1908 11/09/21 2047 11/10/21 0038 11/10/21 0343 11/10/21 0641 11/10/21 0830 11/10/21 1200 11/11/21 0536 11/11/21 0951 11/12/21 0134 11/13/21 0159 11/14/21 0018  NA 137  --   --   --   --    < > 138 137  --  138 138 138  K 4.4  --   --   --   --    < > 4.3 3.7  --  4.0 3.9 4.1  CL 101  --   --   --   --    < > 107 107  --  105 102 103  CO2 25  --   --   --   --    < > 24 22  --  23 26 25   GLUCOSE 171*  --   --   --   --    < > 133* 126*  --  132* 128* 113*  BUN 35*  --   --   --   --    < > 33* 30*  --  28*  26* 26*  CREATININE 2.47*  --   --   --   --    < > 2.30* 2.07*  --  2.17* 2.37* 2.43*  CALCIUM 8.9  --   --   --   --    < > 8.3* 8.0*  --  8.2* 8.3* 8.3*  AST 16  --   --   --   --   --  14*  --   --  21 18 15   ALT 12  --   --   --   --   --  10  --   --  12 12 13   ALKPHOS 86  --   --   --   --   --  59  --   --  52 57 61  BILITOT 1.0  --   --   --   --   --  0.7  --   --  0.5 0.3 0.3  ALBUMIN 3.5  --   --   --   --   --  2.5*  --   --  2.4* 2.4* 2.4*  MG  --   --   --   --   --   --   --   --   --  1.9 2.0 1.9  LATICACIDVEN  --  2.5* 2.5* 3.0* 2.6*  --   --  1.2  --   --   --   --   TSH  --   --   --   --   --   --   --   --  3.305  --   --   --   BNP  --   --   --   --   --   --   --   --  532.2* 856.1* 551.9* 664.4*   < > = values in  this interval not displayed.    ------------------------------------------------------------------------------------------------------------------ No results for input(s): CHOL, HDL, LDLCALC, TRIG, CHOLHDL, LDLDIRECT in the last 72 hours.  Lab Results  Component Value Date   HGBA1C 6.0 (H) 05/13/2019    No results for input(s): TSH, T4TOTAL, T3FREE, THYROIDAB in the last 72 hours.  Invalid input(s): FREET3  ------------------------------------------------------------------------------------------------------------------ ID Labs Recent Labs  Lab 11/09/21 2047 11/10/21 0038 11/10/21 0343 11/10/21 0641 11/10/21 0830 11/10/21 1200 11/11/21 0536 11/12/21 0134 11/13/21 0159 11/14/21 0018  WBC  --   --   --   --    < > 16.8* 12.1* 12.4* 11.4* 8.6  PLT  --   --   --   --    < > 221 206 223 233 216  LATICACIDVEN 2.5* 2.5* 3.0* 2.6*  --   --  1.2  --   --   --   CREATININE  --   --   --   --    < > 2.30* 2.07* 2.17* 2.37* 2.43*   < > = values in this interval not displayed.       Micro Results Recent Results (from the past 240 hour(s))  Urine Culture     Status: Abnormal   Collection Time: 11/09/21  7:10 PM   Specimen: Urine, Clean Catch   Result Value Ref Range Status   Specimen Description URINE, CLEAN CATCH  Final   Special Requests   Final    NONE Performed at [facility], [address] [place], [place] [address]    Culture MULTIPLE SPECIES PRESENT, SUGGEST RECOLLECTION (A)  Final   Report Status 11/11/2021 FINAL  Final  Culture, blood (single)     Status: None (Preliminary result)   Collection Time: 11/10/21 12:13 AM   Specimen: BLOOD  Result Value Ref Range Status   Specimen Description BLOOD RIGHT ANTECUBITAL  Final   Special Requests   Final    BOTTLES DRAWN AEROBIC AND ANAEROBIC Blood Culture results may not be optimal due to an inadequate volume of blood received in culture bottles   Culture   Final    NO GROWTH 3 DAYS Performed at [facility], [place] [address], [place], [place] [address]    Report Status PENDING  Incomplete  Culture, blood (routine x 2)     Status: None (Preliminary result)   Collection Time: 11/10/21  8:30 AM   Specimen: BLOOD  Result Value Ref Range Status   Specimen Description BLOOD LEFT ANTECUBITAL  Final   Special Requests   Final    BOTTLES DRAWN AEROBIC AND ANAEROBIC Blood Culture results may not be optimal due to an inadequate volume of blood received in culture bottles   Culture   Final    NO GROWTH 3 DAYS Performed at [facility], [place] [address], [place], [place] [address]    Report Status PENDING  Incomplete  Culture, blood (routine x 2)     Status: None (Preliminary result)   Collection Time: 11/10/21  8:46 AM   Specimen: BLOOD  Result Value Ref Range Status   Specimen Description BLOOD RIGHT ANTECUBITAL  Final   Special Requests   Final    BOTTLES DRAWN AEROBIC AND ANAEROBIC Blood Culture adequate volume   Culture   Final    NO GROWTH 3 DAYS Performed at [facility], [place] [address], [place], [place] [address]    Report Status PENDING  Incomplete    Radiology Reports DG Chest [place] 1  View  Result Date: 11/14/2021 CLINICAL DATA:   Shortness of breath EXAM: PORTABLE CHEST 1 VIEW COMPARISON:  11/12/2021 FINDINGS: Heart is normal size. Bilateral lower lobe opacities. Some improvement on the right since prior study. Findings similar on the left with layering left effusion. No acute bony abnormality. IMPRESSION: Layering left effusion with stable left basilar atelectasis or infiltrate. Improving right base atelectasis. Electronically Signed   By: Rolm Baptise M.D.   On: 11/14/2021 08:43   DG Chest Port 1 View  Result Date: 11/12/2021 CLINICAL DATA:  Shortness of breath with wheezing and cough. EXAM: PORTABLE CHEST 1 VIEW COMPARISON:  11/11/2021 FINDINGS: 0809 hours. Low volumes. Bibasilar collapse/consolidation with small bilateral pleural effusions. The cardio pericardial silhouette is enlarged. The visualized bony structures of the thorax show no acute abnormality. Telemetry leads overlie the chest. IMPRESSION: Mild progression of bibasilar collapse/consolidation with small bilateral pleural effusions. Electronically Signed   By: Misty Stanley M.D.   On: 11/12/2021 08:31   DG Chest Port 1 View  Result Date: 11/11/2021 CLINICAL DATA:  Shortness of breath EXAM: PORTABLE CHEST 1 VIEW COMPARISON:  04/13/2021 FINDINGS: Heart is normal size. Bibasilar airspace opacities are similar prior study. Hiatal hernia noted. No effusions or acute bony abnormality. IMPRESSION: Chronic bibasilar opacities, similar prior study. Favor scarring or atelectasis. Infiltrate cannot be completely excluded at the left lung base. Moderate-sized hiatal hernia. Electronically Signed   By: Rolm Baptise M.D.   On: 11/11/2021 10:02   ECHOCARDIOGRAM COMPLETE  Result Date: 11/11/2021    ECHOCARDIOGRAM REPORT   Patient Name:   [name] Date of Exam: 11/11/2021 Medical Rec #:  [id number]         Height:       65.0 in Accession #:    [id number]        Weight:       154.3 lb Date of Birth:  06/13/1918        BSA:          1.772 m Patient Age:    89 years          BP:           99/45 mmHg Patient Gender: M                 HR:           86 bpm. Exam Location:  Inpatient Procedure: 2D Echo, Cardiac Doppler, Color Doppler and Intracardiac            Opacification Agent                        STAT ECHO Reported to: Dr Lala Lund on 11/11/2021 2:00:00 PM               New WMA and elevated RVSP. Indications:    NSTEMI I21.4  History:        Patient has prior history of Echocardiogram examinations, most                 recent 04/14/2021. CHF, Arrythmias:Atrial Fibrillation,                 Tachycardia and Mildly prolonged QTC; Risk Factors:Dyslipidemia                 and Hypertension. Hypothyroidism.  Sonographer:    Darlina Sicilian RDCS Referring Phys: Arnell Asal Margaree Mackintosh [place]  1. Left ventricular ejection fraction, by estimation, is 40 to 45%. The  left ventricle has mildly decreased function. The left ventricle demonstrates regional wall motion abnormalities (see scoring diagram/findings for description). Left ventricular diastolic parameters are consistent with Grade II diastolic dysfunction (pseudonormalization).  2. Right ventricular systolic function is moderately reduced. The right ventricular size is not well visualized. There is moderately elevated pulmonary artery systolic pressure.  3. The mitral valve was not well visualized. Mild to moderate mitral valve regurgitation. Moderate mitral annular calcification.  4. Tricuspid valve regurgitation is moderate.  5. The aortic valve was not well visualized. Aortic valve regurgitation is not visualized. Comparison(s): Changes from prior study are noted. Conclusion(s)/Recommendation(s): Even with use of echo contrast, very difficult images. There appears to be inferior/lateral LV wall motion abnormalities with mildly decreased LV function. The RV is not well seen, but function appears likely moderately reduced, and there is elevated RVSP. FINDINGS  Left Ventricle: Left ventricular ejection fraction, by estimation,  is 40 to 45%. The left ventricle has mildly decreased function. The left ventricle demonstrates regional wall motion abnormalities. Definity contrast agent was given IV to delineate the left ventricular endocardial borders. The left ventricular internal cavity size was normal in size. There is no left ventricular hypertrophy. Left ventricular diastolic parameters are consistent with Grade II diastolic dysfunction (pseudonormalization).  LV Wall Scoring: The antero-lateral wall, inferior wall, and posterior wall are hypokinetic. The entire septum, entire apex, and basal anterior segment are normal. Right Ventricle: The right ventricular size is not well visualized. Right vetricular wall thickness was not well visualized. Right ventricular systolic function is moderately reduced. There is moderately elevated pulmonary artery systolic pressure. The tricuspid regurgitant velocity is 3.44 m/s, and with an assumed right atrial pressure of 8 mmHg, the estimated right ventricular systolic pressure is 58.0 mmHg. Left Atrium: Left atrial size was normal in size. Right Atrium: Right atrial size was normal in size. Pericardium: There is no evidence of pericardial effusion. Mitral Valve: The mitral valve was not well visualized. Moderate mitral annular calcification. Mild to moderate mitral valve regurgitation. Tricuspid Valve: The tricuspid valve is grossly normal. Tricuspid valve regurgitation is moderate. Aortic Valve: The aortic valve was not well visualized. Aortic valve regurgitation is not visualized. Pulmonic Valve: The pulmonic valve was not well visualized. Pulmonic valve regurgitation is not visualized. Aorta: The ascending aorta was not well visualized, the aortic arch was not well visualized and the aortic root is normal in size and structure. IAS/Shunts: The interatrial septum was not well visualized.  LEFT VENTRICLE PLAX 2D LVIDd:         4.80 cm      Diastology LVIDs:         3.50 cm      LV e' medial:    4.24  cm/s LV PW:         0.80 cm      LV E/e' medial:  30.9 LV IVS:        0.80 cm      LV e' lateral:   7.83 cm/s LVOT diam:     1.80 cm      LV E/e' lateral: 16.7 LV SV:         28 LV SV Index:   16 LVOT Area:     2.54 cm  LV Volumes (MOD) LV vol d, MOD A2C: 120.0 ml LV vol d, MOD A4C: 71.0 ml LV vol s, MOD A2C: 77.3 ml LV vol s, MOD A4C: 39.9 ml LV SV MOD A2C:     42.7 ml LV SV MOD  A4C:     71.0 ml LV SV MOD BP:      37.8 ml RIGHT VENTRICLE RV S prime:     5.87 cm/s TAPSE (M-mode): 1.0 cm LEFT ATRIUM             Index        RIGHT ATRIUM           Index LA diam:        3.80 cm 2.14 cm/m   RA Area:     11.10 cm LA Vol (A2C):   22.3 ml 12.59 ml/m  RA Volume:   22.00 ml  12.42 ml/m LA Vol (A4C):   31.2 ml 17.61 ml/m LA Biplane Vol: 28.4 ml 16.03 ml/m  AORTIC VALVE LVOT Vmax:   61.50 cm/s LVOT Vmean:  42.700 cm/s LVOT VTI:    0.110 m  AORTA Ao Root diam: 3.20 cm MITRAL VALVE                  TRICUSPID VALVE MV Area (PHT): 3.65 cm       TR Peak grad:   47.3 mmHg MV Decel Time: 208 msec       TR Vmax:        344.00 cm/s MR Peak grad:    78.5 mmHg MR Mean grad:    54.0 mmHg    SHUNTS MR Vmax:         443.00 cm/s  Systemic VTI:  0.11 m MR Vmean:        353.0 cm/s   Systemic Diam: 1.80 cm MR PISA:         0.57 cm MR PISA Eff ROA: 4 mm MR PISA Radius:  0.30 cm MV E velocity: 131.00 cm/s MV A velocity: 68.70 cm/s MV E/A ratio:  1.91 Buford Dresser MD Electronically signed by Buford Dresser MD Signature Date/Time: 11/11/2021/2:01:00 PM    Final

## 2021-11-14 NOTE — Progress Notes (Signed)
[place] [id number] [facility]) Hospital Liaison Note  Notified by Clide Cliff of patient/family request for [facility] Palliative services at home after discharge.   [facility] liaison will follow patient for discharge disposition.   Please call with any Hospice/Palliative related questions or concerns.   Thank you for the opportunity to participate in this patient's care  Jhonnie Garner RN, BSN, [facility] [phone]

## 2021-11-15 ENCOUNTER — Inpatient Hospital Stay (HOSPITAL_COMMUNITY): Payer: Medicare Other

## 2021-11-15 DIAGNOSIS — N12 Tubulo-interstitial nephritis, not specified as acute or chronic: Secondary | ICD-10-CM | POA: Diagnosis not present

## 2021-11-15 LAB — CULTURE, BLOOD (SINGLE): Culture: NO GROWTH

## 2021-11-15 LAB — COMPREHENSIVE METABOLIC PANEL
ALT: 12 U/L (ref 0–44)
AST: 19 U/L (ref 15–41)
Albumin: 2.1 g/dL — ABNORMAL LOW (ref 3.5–5.0)
Alkaline Phosphatase: 53 U/L (ref 38–126)
Anion gap: 11 (ref 5–15)
BUN: 24 mg/dL — ABNORMAL HIGH (ref 8–23)
CO2: 24 mmol/L (ref 22–32)
Calcium: 8.3 mg/dL — ABNORMAL LOW (ref 8.9–10.3)
Chloride: 104 mmol/L (ref 98–111)
Creatinine, Ser: 2.53 mg/dL — ABNORMAL HIGH (ref 0.61–1.24)
GFR, Estimated: 22 mL/min — ABNORMAL LOW (ref >60)
Glucose, Bld: 108 mg/dL — ABNORMAL HIGH (ref 70–99)
Potassium: 4.1 mmol/L (ref 3.5–5.1)
Sodium: 139 mmol/L (ref 135–145)
Total Bilirubin: 0.7 mg/dL (ref 0.3–1.2)
Total Protein: 5 g/dL — ABNORMAL LOW (ref 6.5–8.1)

## 2021-11-15 LAB — CULTURE, BLOOD (ROUTINE X 2)
Culture: NO GROWTH
Culture: NO GROWTH
Special Requests: ADEQUATE

## 2021-11-15 LAB — CBC WITH DIFFERENTIAL/PLATELET
Abs Immature Granulocytes: 0.03 10*3/uL (ref 0.00–0.07)
Basophils Absolute: 0.1 10*3/uL (ref 0.0–0.1)
Basophils Relative: 1 %
Eosinophils Absolute: 0.2 10*3/uL (ref 0.0–0.5)
Eosinophils Relative: 2 %
HCT: 30.5 % — ABNORMAL LOW (ref 39.0–52.0)
Hemoglobin: 9.9 g/dL — ABNORMAL LOW (ref 13.0–17.0)
Immature Granulocytes: 0 %
Lymphocytes Relative: 21 %
Lymphs Abs: 2 10*3/uL (ref 0.7–4.0)
MCH: 31.7 pg (ref 26.0–34.0)
MCHC: 32.5 g/dL (ref 30.0–36.0)
MCV: 97.8 fL (ref 80.0–100.0)
Monocytes Absolute: 0.9 10*3/uL (ref 0.1–1.0)
Monocytes Relative: 10 %
Neutro Abs: 6.3 10*3/uL (ref 1.7–7.7)
Neutrophils Relative %: 66 %
Platelets: 227 10*3/uL (ref 150–400)
RBC: 3.12 MIL/uL — ABNORMAL LOW (ref 4.22–5.81)
RDW: 14 % (ref 11.5–15.5)
WBC: 9.5 10*3/uL (ref 4.0–10.5)
nRBC: 0 % (ref 0.0–0.2)

## 2021-11-15 LAB — BRAIN NATRIURETIC PEPTIDE: B Natriuretic Peptide: 734 pg/mL — ABNORMAL HIGH (ref 0.0–100.0)

## 2021-11-15 LAB — MAGNESIUM: Magnesium: 1.9 mg/dL (ref 1.7–2.4)

## 2021-11-15 MED ORDER — LACTATED RINGERS IV SOLN: INTRAVENOUS | Status: AC

## 2021-11-15 MED ORDER — ALBUTEROL SULFATE (2.5 MG/3ML) 0.083% IN NEBU
2.5000 mg | INHALATION_SOLUTION | Freq: Two times a day (BID) | RESPIRATORY_TRACT | Status: DC
  Administered 2021-11-15 – 2021-11-17 (×4): 2.5 mg via RESPIRATORY_TRACT
  Filled 2021-11-15 (×4): qty 3

## 2021-11-15 NOTE — TOC Progression Note (Signed)
Transition of Care [facility]) - Progression Note    Patient Details  Name: Robert Rowe MRN: [id number] Date of Birth: September 28, 1918  [facility] CM/SW Contact  Cyndi Bender, RN Phone Number: 11/15/2021, 11:50 AM  Clinical Narrative:    Spoke to wife and patient at the bedside. Eliquis card given to wife to take to pharmacy. Wife will arrange caregivers to be at the house once discharged. Will call [facility] once patient has discharge orders possible tomorrow. All paperwork in the chart for [facility] transportation.  TOC will continue to follow.  Expected Discharge Plan: Home/Self Care Barriers to Discharge: Continued Medical Work up  Expected Discharge Plan and Services Expected Discharge Plan: Home/Self Care   Discharge Planning Services: CM Consult   Living arrangements for the past 2 months: Single Family Home                                       Social Determinants of Health (SDOH) Interventions    Readmission Risk Interventions No flowsheet data found.

## 2021-11-15 NOTE — Progress Notes (Signed)
PROGRESS NOTE                                                                                                                                                                                                             Patient Demographics:    Robert Rowe, is a 87 y.o. 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 lower abdominal discomfort in the hospital he was diagnosed with pyelonephritis on the left side due to a past left ureteric stone.  He also had sepsis and subsequently went into A. fib RVR.  He came into my care early morning 11/11/2021 at 7 AM.   Subjective:   Patient in bed, appears comfortable, denies any headache, no fever, no chest pain or pressure, no shortness of breath , no abdominal pain. No new focal weakness.    Assessment  & Plan :     Sepsis due to left-sided pyelonephritis caused by a passed left ureteral stone - he been placed on IV fluids along with empiric IV antibiotics, cultures so far negative, sepsis pathophysiology is improving, continue antibiotics stone likely has passed as per CT scan.  2.  Paroxysmal A. fib RVR with NSTEMI, [place] vas 2 score of greater than 5.  He is now on rate control with amiodarone initially drip now oral, oral Eliquis for anticoagulation, blood pressure too low to tolerate beta-blocker or ACE/ARB, overall remains tenuous.    3. HX of stroke with right-sided weakness.  We will stop Plavix and switch him to Eliquis.  4.  Dyslipidemia.  On statin.  5.   Acute systolic  and diastolic CHF kindly see #2 above - EF 45% .  Gentle diuresis as tolerated by blood pressure.  Midodrine to augment blood pressure.  Cannot use B-Blocker, ACE and ARB for underlying CKD  and hypotension., in the interim have to give some IV fluids as well as he gets profoundly hypotensive due to stunned myocardium.  6.  GERD.  PPI.  7.  Hypothyroidism.  On Synthroid.  Stable TSH.  8.  Mildly prolonged QTC.  Give magnesium monitor electrolytes.  When blood pressure improves low-dose beta-blocker.  9. AKI on CKD 4.  Baseline creatinine around 1.8. Monitor with as needed diuresis.  Overall extremely tenuous due to sepsis, pyelonephritis, A. fib RVR and NSTEMI with hypotension.  Continue medical treatment, DNR, if declines further then comfort measures.       Condition - Extremely Guarded  Family Communication  : Son and wife bedside on 11/11/2021, son 11/12/21, wife 11/13/21, wife 11/14/21, son 11/15/21  Note patient's wife and son are not in agreement with patient's disposition.  We cannot intervene in family dynamics, once medically ready we will inform of discharge, PT to evaluate here.  Code Status : DNR  Consults  : Palliative care  PUD Prophylaxis : PPI   Procedures  :     CT - 1. Mild left hydroureter with associated periureteral fat stranding. Mild perivesicular fat stranding. No definite ureterolithiasis. Bilateral nonobstructive punctate nephrolithiasis. Finding likely represents a recently passed stone. Differential diagnosis includes chronic changes of obstructive uropathy or reflux. Recommend correlation with urinalysis to evaluate for superimposed infection. 2. Interval development of an L1 compression fracture with greater than 25% vertebral body height loss. Correlate with point tenderness to palpation to evaluate for an acute abnormality. 3. Partially visualized large hiatal hernia containing the entire stomach. No findings of associated bowel obstruction. Other imaging  findings of potential clinical significance: 1. Scattered colonic diverticulosis with no acute diverticulitis. 2. Diffuse decreased bone density with interval worsening of multilevel severe degenerative changes of the spine. 3. Distal infrarenal abdominal aorta aneurysm (3 cm). Recommend follow-up ultrasound every 3 years. This recommendation follows ACR consensus guidelines: White Paper of the ACR Incidental Findings Committee II on Vascular Findings.  TTE -  1. Left ventricular ejection fraction, by estimation, is 40 to 45%. The left ventricle has mildly decreased function. The left ventricle demonstrates regional wall motion abnormalities (see scoring diagram/findings for description). Left ventricular diastolic parameters are consistent with Grade II diastolic dysfunction (pseudonormalization).  2. Right ventricular systolic function is moderately reduced. The right ventricular size is not well visualized. There is moderately elevated pulmonary artery systolic pressure.  3. The mitral valve was not well visualized. Mild to moderate mitral valve regurgitation. Moderate mitral annular calcification.  4. Tricuspid valve regurgitation is moderate.  5. The aortic valve was not well visualized. Aortic valve regurgitation is not visualized      Disposition Plan  :    Status is: Inpatient  Remains inpatient appropriate because: Pyelonephritis, NSTEMI, A. fib RVR   DVT Prophylaxis  :    apixaban (ELIQUIS) tablet 2.5 mg Start: 11/13/21 1400 apixaban (ELIQUIS) tablet 2.5 mg    Lab Results  Component Value Date   PLT 227 11/15/2021    Diet :  Diet Order             Diet Heart Room service appropriate? Yes; Fluid consistency: Thin  Diet effective now                    Inpatient Medications  Scheduled Meds:  albuterol  2.5 mg Nebulization TID   amiodarone  200 mg Oral BID   apixaban  2.5 mg Oral BID   aspirin  81 mg Oral Daily   atorvastatin  40 mg Oral Daily   fentaNYL (SUBLIMAZE)  injection  50 mcg Intravenous Once  latanoprost  1 drop Both Eyes QHS   levothyroxine  50 mcg Oral Q0600   midodrine  10 mg Oral TID WC   pantoprazole  40 mg Oral Daily   Continuous Infusions:  cefTRIAXone (ROCEPHIN)  IV 2 g (11/15/21 0221)   lactated ringers 100 mL/hr at 11/15/21 0633   PRN Meds:.acetaminophen **OR** acetaminophen, albuterol, nitroGLYCERIN, senna-docusate  Antibiotics  :    Anti-infectives (From admission, onward)    Start     Dose/Rate Route Frequency Ordered Stop   11/11/21 0200  cefTRIAXone (ROCEPHIN) 2 g in sodium chloride 0.9 % 100 mL IVPB        2 g 200 mL/hr over 30 Minutes Intravenous Every 24 hours 11/10/21 0814     11/10/21 1145  cefTRIAXone (ROCEPHIN) 2 g in sodium chloride 0.9 % 100 mL IVPB  Status:  Discontinued        2 g 200 mL/hr over 30 Minutes Intravenous Every 24 hours 11/10/21 1138 11/10/21 1145   11/10/21 0000  cefTRIAXone (ROCEPHIN) 2 g in sodium chloride 0.9 % 100 mL IVPB        2 g 200 mL/hr over 30 Minutes Intravenous  Once 11/09/21 2354 11/10/21 0115        Time Spent in minutes  30   Lala Lund M.D on 11/15/2021 at 10:23 AM  To page go to [email or web address]   [facility] -  Office  [phone]  See all Orders from today for further details    Objective:   Vitals:   11/15/21 0308 11/15/21 0500 11/15/21 0740 11/15/21 0811  BP: (!) 92/57  (!) 80/70 (!) 95/37  Pulse: 82  83 83  Resp: 20  17 17   Temp: (!) 97.5 F (36.4 C)  98.1 F (36.7 C) 98.1 F (36.7 C)  TempSrc: Oral  Oral Oral  SpO2: 94%  95% 96%  Weight:  68.5 kg    Height:        Wt Readings from Last 3 Encounters:  11/15/21 68.5 kg  04/15/21 66.4 kg  09/11/20 63.5 kg     Intake/Output Summary (Last 24 hours) at 11/15/2021 1023 Last data filed at 11/15/2021 0815 Gross per 24 hour  Intake 540 ml  Output 950 ml  Net -410 ml     Physical Exam  Awake Alert, No new F.N deficits, Normal affect .AT,PERRAL Supple Neck, No JVD,    Symmetrical Chest wall movement, Good air movement bilaterally, mild wheezing and fine rales RRR,No Gallops, Rubs or new Murmurs,  +ve B.Sounds, Abd Soft, No tenderness,   No Cyanosis, Clubbing or edema    Data Review:    CBC Recent Labs  Lab 11/11/21 0536 11/12/21 0134 11/13/21 0159 11/14/21 0018 11/15/21 0310  WBC 12.1* 12.4* 11.4* 8.6 9.5  HGB 10.3* 9.9* 10.3* 10.3* 9.9*  HCT 31.4* 30.3* 32.0* 31.5* 30.5*  PLT 206 223 233 216 227  MCV 99.1 98.4 99.7 99.4 97.8  MCH 32.5 32.1 32.1 32.5 31.7  MCHC 32.8 32.7 32.2 32.7 32.5  RDW 13.8 14.0 14.1 14.2 14.0  LYMPHSABS 2.0 2.1 2.2 1.7 2.0  MONOABS 0.9 1.0 1.0 0.8 0.9  EOSABS 0.1 0.1 0.3 0.2 0.2  BASOSABS 0.1 0.1 0.1 0.1 0.1    Electrolytes Recent Labs  Lab 11/09/21 2047 11/10/21 0038 11/10/21 0343 11/10/21 0641 11/10/21 0830 11/10/21 1200 11/11/21 0536 11/11/21 0951 11/12/21 0134 11/13/21 0159 11/14/21 0018 11/15/21 0310  NA  --   --   --   --    < >  138 137  --  138 138 138 139  K  --   --   --   --    < > 4.3 3.7  --  4.0 3.9 4.1 4.1  CL  --   --   --   --    < > 107 107  --  105 102 103 104  CO2  --   --   --   --    < > 24 22  --  23 26 25 24   GLUCOSE  --   --   --   --    < > 133* 126*  --  132* 128* 113* 108*  BUN  --   --   --   --    < > 33* 30*  --  28* 26* 26* 24*  CREATININE  --   --   --   --    < > 2.30* 2.07*  --  2.17* 2.37* 2.43* 2.53*  CALCIUM  --   --   --   --    < > 8.3* 8.0*  --  8.2* 8.3* 8.3* 8.3*  AST  --   --   --   --   --  14*  --   --  21 18 15 19   ALT  --   --   --   --   --  10  --   --  12 12 13 12   ALKPHOS  --   --   --   --   --  59  --   --  52 57 61 53  BILITOT  --   --   --   --   --  0.7  --   --  0.5 0.3 0.3 0.7  ALBUMIN  --   --   --   --   --  2.5*  --   --  2.4* 2.4* 2.4* 2.1*  MG  --   --   --   --   --   --   --   --  1.9 2.0 1.9 1.9  LATICACIDVEN 2.5* 2.5* 3.0* 2.6*  --   --  1.2  --   --   --   --   --   TSH  --   --   --   --   --   --   --  3.305  --   --   --   --    BNP  --   --   --   --   --   --   --  532.2* 856.1* 551.9* 664.4* 734.0*   < > = values in this interval not displayed.    ------------------------------------------------------------------------------------------------------------------ No results for input(s): CHOL, HDL, LDLCALC, TRIG, CHOLHDL, LDLDIRECT in the last 72 hours.  Lab Results  Component Value Date   HGBA1C 6.0 (H) 05/13/2019    No results for input(s): TSH, T4TOTAL, T3FREE, THYROIDAB in the last 72 hours.  Invalid input(s): FREET3  ------------------------------------------------------------------------------------------------------------------ ID Labs Recent Labs  Lab 11/09/21 2047 11/10/21 0038 11/10/21 0343 11/10/21 0641 11/10/21 0830 11/11/21 0536 11/12/21 0134 11/13/21 0159 11/14/21 0018 11/15/21 0310  WBC  --   --   --   --    < > 12.1* 12.4* 11.4* 8.6 9.5  PLT  --   --   --   --    < > 206 223 233 216 227  LATICACIDVEN  2.5* 2.5* 3.0* 2.6*  --  1.2  --   --   --   --   CREATININE  --   --   --   --    < > 2.07* 2.17* 2.37* 2.43* 2.53*   < > = values in this interval not displayed.       Micro Results Recent Results (from the past 240 hour(s))  Urine Culture     Status: Abnormal   Collection Time: 11/09/21  7:10 PM   Specimen: Urine, Clean Catch  Result Value Ref Range Status   Specimen Description URINE, CLEAN CATCH  Final   Special Requests   Final    NONE Performed at [facility], [address], [place], [place] [address]    Culture MULTIPLE SPECIES PRESENT, SUGGEST RECOLLECTION (A)  Final   Report Status 11/11/2021 FINAL  Final  Culture, blood (single)     Status: None   Collection Time: 11/10/21 12:13 AM   Specimen: BLOOD  Result Value Ref Range Status   Specimen Description BLOOD RIGHT ANTECUBITAL  Final   Special Requests   Final    BOTTLES DRAWN AEROBIC AND ANAEROBIC Blood Culture results may not be optimal due to an inadequate volume of blood received in culture bottles    Culture   Final    NO GROWTH 5 DAYS Performed at [facility], [place] [address] [place], [place] [address]    Report Status 11/15/2021 FINAL  Final  Culture, blood (routine x 2)     Status: None   Collection Time: 11/10/21  8:30 AM   Specimen: BLOOD  Result Value Ref Range Status   Specimen Description BLOOD LEFT ANTECUBITAL  Final   Special Requests   Final    BOTTLES DRAWN AEROBIC AND ANAEROBIC Blood Culture results may not be optimal due to an inadequate volume of blood received in culture bottles   Culture   Final    NO GROWTH 5 DAYS Performed at [facility], [place] [address], [place], [place] [address]    Report Status 11/15/2021 FINAL  Final  Culture, blood (routine x 2)     Status: None   Collection Time: 11/10/21  8:46 AM   Specimen: BLOOD  Result Value Ref Range Status   Specimen Description BLOOD RIGHT ANTECUBITAL  Final   Special Requests   Final    BOTTLES DRAWN AEROBIC AND ANAEROBIC Blood Culture adequate volume   Culture   Final    NO GROWTH 5 DAYS Performed at [facility], [place] [address], [place], [place] [address]    Report Status 11/15/2021 FINAL  Final    Radiology Reports DG Chest Port 1 View  Result Date: 11/15/2021 CLINICAL DATA:  Shortness of breath EXAM: PORTABLE CHEST 1 VIEW COMPARISON:  11/14/2021 FINDINGS: Small layering left pleural effusion. Associated bilateral lower lobe atelectasis. Low lung volumes. No frank interstitial edema. No pneumothorax. The heart is normal in size. IMPRESSION: Small layering left pleural effusion with associated bilateral lower lobe atelectasis. Electronically Signed   By: Julian Hy M.D.   On: 11/15/2021 06:41   DG Chest Port 1 View  Result Date: 11/14/2021 CLINICAL DATA:  Shortness of breath EXAM: PORTABLE CHEST 1 VIEW COMPARISON:  11/12/2021 FINDINGS: Heart is normal size. Bilateral lower lobe opacities. Some improvement on the right since prior study. Findings similar on the left with  layering left effusion. No acute bony abnormality. IMPRESSION: Layering left effusion with stable left basilar  atelectasis or infiltrate. Improving right base atelectasis. Electronically Signed   By: Rolm Baptise M.D.   On: 11/14/2021 08:43   DG Chest Port 1 View  Result Date: 11/12/2021 CLINICAL DATA:  Shortness of breath with wheezing and cough. EXAM: PORTABLE CHEST 1 VIEW COMPARISON:  11/11/2021 FINDINGS: 0809 hours. Low volumes. Bibasilar collapse/consolidation with small bilateral pleural effusions. The cardio pericardial silhouette is enlarged. The visualized bony structures of the thorax show no acute abnormality. Telemetry leads overlie the chest. IMPRESSION: Mild progression of bibasilar collapse/consolidation with small bilateral pleural effusions. Electronically Signed   By: Misty Stanley M.D.   On: 11/12/2021 08:31   ECHOCARDIOGRAM COMPLETE  Result Date: 11/11/2021    ECHOCARDIOGRAM REPORT   Patient Name:   [name] Date of Exam: 11/11/2021 Medical Rec #:  [id number]         Height:       65.0 in Accession #:    [id number]        Weight:       154.3 lb Date of Birth:  04/01/1918        BSA:          1.772 m Patient Age:    89 years         BP:           99/45 mmHg Patient Gender: M                 HR:           86 bpm. Exam Location:  Inpatient Procedure: 2D Echo, Cardiac Doppler, Color Doppler and Intracardiac            Opacification Agent                        STAT ECHO Reported to: Dr Lala Lund on 11/11/2021 2:00:00 PM               New WMA and elevated RVSP. Indications:    NSTEMI I21.4  History:        Patient has prior history of Echocardiogram examinations, most                 recent 04/14/2021. CHF, Arrythmias:Atrial Fibrillation,                 Tachycardia and Mildly prolonged QTC; Risk Factors:Dyslipidemia                 and Hypertension. Hypothyroidism.  Sonographer:    Darlina Sicilian RDCS Referring Phys: Arnell Asal Margaree Mackintosh [place]  1. Left ventricular  ejection fraction, by estimation, is 40 to 45%. The left ventricle has mildly decreased function. The left ventricle demonstrates regional wall motion abnormalities (see scoring diagram/findings for description). Left ventricular diastolic parameters are consistent with Grade II diastolic dysfunction (pseudonormalization).  2. Right ventricular systolic function is moderately reduced. The right ventricular size is not well visualized. There is moderately elevated pulmonary artery systolic pressure.  3. The mitral valve was not well visualized. Mild to moderate mitral valve regurgitation. Moderate mitral annular calcification.  4. Tricuspid valve regurgitation is moderate.  5. The aortic valve was not well visualized. Aortic valve regurgitation is not visualized. Comparison(s): Changes from prior study are noted. Conclusion(s)/Recommendation(s): Even with use of echo contrast, very difficult images. There appears to be inferior/lateral LV wall motion abnormalities with mildly decreased LV function. The RV is not well seen, but function appears likely moderately reduced, and there  is elevated RVSP. FINDINGS  Left Ventricle: Left ventricular ejection fraction, by estimation, is 40 to 45%. The left ventricle has mildly decreased function. The left ventricle demonstrates regional wall motion abnormalities. Definity contrast agent was given IV to delineate the left ventricular endocardial borders. The left ventricular internal cavity size was normal in size. There is no left ventricular hypertrophy. Left ventricular diastolic parameters are consistent with Grade II diastolic dysfunction (pseudonormalization).  LV Wall Scoring: The antero-lateral wall, inferior wall, and posterior wall are hypokinetic. The entire septum, entire apex, and basal anterior segment are normal. Right Ventricle: The right ventricular size is not well visualized. Right vetricular wall thickness was not well visualized. Right ventricular systolic  function is moderately reduced. There is moderately elevated pulmonary artery systolic pressure. The tricuspid regurgitant velocity is 3.44 m/s, and with an assumed right atrial pressure of 8 mmHg, the estimated right ventricular systolic pressure is 92.4 mmHg. Left Atrium: Left atrial size was normal in size. Right Atrium: Right atrial size was normal in size. Pericardium: There is no evidence of pericardial effusion. Mitral Valve: The mitral valve was not well visualized. Moderate mitral annular calcification. Mild to moderate mitral valve regurgitation. Tricuspid Valve: The tricuspid valve is grossly normal. Tricuspid valve regurgitation is moderate. Aortic Valve: The aortic valve was not well visualized. Aortic valve regurgitation is not visualized. Pulmonic Valve: The pulmonic valve was not well visualized. Pulmonic valve regurgitation is not visualized. Aorta: The ascending aorta was not well visualized, the aortic arch was not well visualized and the aortic root is normal in size and structure. IAS/Shunts: The interatrial septum was not well visualized.  LEFT VENTRICLE PLAX 2D LVIDd:         4.80 cm      Diastology LVIDs:         3.50 cm      LV e' medial:    4.24 cm/s LV PW:         0.80 cm      LV E/e' medial:  30.9 LV IVS:        0.80 cm      LV e' lateral:   7.83 cm/s LVOT diam:     1.80 cm      LV E/e' lateral: 16.7 LV SV:         28 LV SV Index:   16 LVOT Area:     2.54 cm  LV Volumes (MOD) LV vol d, MOD A2C: 120.0 ml LV vol d, MOD A4C: 71.0 ml LV vol s, MOD A2C: 77.3 ml LV vol s, MOD A4C: 39.9 ml LV SV MOD A2C:     42.7 ml LV SV MOD A4C:     71.0 ml LV SV MOD BP:      37.8 ml RIGHT VENTRICLE RV S prime:     5.87 cm/s TAPSE (M-mode): 1.0 cm LEFT ATRIUM             Index        RIGHT ATRIUM           Index LA diam:        3.80 cm 2.14 cm/m   RA Area:     11.10 cm LA Vol (A2C):   22.3 ml 12.59 ml/m  RA Volume:   22.00 ml  12.42 ml/m LA Vol (A4C):   31.2 ml 17.61 ml/m LA Biplane Vol: 28.4 ml 16.03  ml/m  AORTIC VALVE LVOT Vmax:   61.50 cm/s LVOT Vmean:  42.700 cm/s LVOT VTI:  0.110 m  AORTA Ao Root diam: 3.20 cm MITRAL VALVE                  TRICUSPID VALVE MV Area (PHT): 3.65 cm       TR Peak grad:   47.3 mmHg MV Decel Time: 208 msec       TR Vmax:        344.00 cm/s MR Peak grad:    78.5 mmHg MR Mean grad:    54.0 mmHg    SHUNTS MR Vmax:         443.00 cm/s  Systemic VTI:  0.11 m MR Vmean:        353.0 cm/s   Systemic Diam: 1.80 cm MR PISA:         0.57 cm MR PISA Eff ROA: 4 mm MR PISA Radius:  0.30 cm MV E velocity: 131.00 cm/s MV A velocity: 68.70 cm/s MV E/A ratio:  1.91 Buford Dresser MD Electronically signed by Buford Dresser MD Signature Date/Time: 11/11/2021/2:01:00 PM    Final

## 2021-11-16 DIAGNOSIS — N12 Tubulo-interstitial nephritis, not specified as acute or chronic: Secondary | ICD-10-CM | POA: Diagnosis not present

## 2021-11-16 LAB — MAGNESIUM: Magnesium: 1.9 mg/dL (ref 1.7–2.4)

## 2021-11-16 LAB — CBC WITH DIFFERENTIAL/PLATELET
Abs Immature Granulocytes: 0.05 10*3/uL (ref 0.00–0.07)
Basophils Absolute: 0.1 10*3/uL (ref 0.0–0.1)
Basophils Relative: 1 %
Eosinophils Absolute: 0.2 10*3/uL (ref 0.0–0.5)
Eosinophils Relative: 2 %
HCT: 31.3 % — ABNORMAL LOW (ref 39.0–52.0)
Hemoglobin: 9.8 g/dL — ABNORMAL LOW (ref 13.0–17.0)
Immature Granulocytes: 1 %
Lymphocytes Relative: 21 %
Lymphs Abs: 1.9 10*3/uL (ref 0.7–4.0)
MCH: 31.3 pg (ref 26.0–34.0)
MCHC: 31.3 g/dL (ref 30.0–36.0)
MCV: 100 fL (ref 80.0–100.0)
Monocytes Absolute: 0.8 10*3/uL (ref 0.1–1.0)
Monocytes Relative: 9 %
Neutro Abs: 5.8 10*3/uL (ref 1.7–7.7)
Neutrophils Relative %: 66 %
Platelets: 234 10*3/uL (ref 150–400)
RBC: 3.13 MIL/uL — ABNORMAL LOW (ref 4.22–5.81)
RDW: 14.1 % (ref 11.5–15.5)
WBC: 8.8 10*3/uL (ref 4.0–10.5)
nRBC: 0 % (ref 0.0–0.2)

## 2021-11-16 LAB — COMPREHENSIVE METABOLIC PANEL
ALT: 14 U/L (ref 0–44)
AST: 19 U/L (ref 15–41)
Albumin: 2.4 g/dL — ABNORMAL LOW (ref 3.5–5.0)
Alkaline Phosphatase: 60 U/L (ref 38–126)
Anion gap: 10 (ref 5–15)
BUN: 23 mg/dL (ref 8–23)
CO2: 25 mmol/L (ref 22–32)
Calcium: 8.3 mg/dL — ABNORMAL LOW (ref 8.9–10.3)
Chloride: 104 mmol/L (ref 98–111)
Creatinine, Ser: 2.61 mg/dL — ABNORMAL HIGH (ref 0.61–1.24)
GFR, Estimated: 21 mL/min — ABNORMAL LOW (ref >60)
Glucose, Bld: 113 mg/dL — ABNORMAL HIGH (ref 70–99)
Potassium: 3.9 mmol/L (ref 3.5–5.1)
Sodium: 139 mmol/L (ref 135–145)
Total Bilirubin: 0.6 mg/dL (ref 0.3–1.2)
Total Protein: 5.1 g/dL — ABNORMAL LOW (ref 6.5–8.1)

## 2021-11-16 LAB — BRAIN NATRIURETIC PEPTIDE: B Natriuretic Peptide: 556.5 pg/mL — ABNORMAL HIGH (ref 0.0–100.0)

## 2021-11-16 MED ORDER — LACTATED RINGERS IV SOLN: INTRAVENOUS | Status: AC

## 2021-11-16 MED ORDER — DICLOFENAC SODIUM 1 % EX GEL
2.0000 g | Freq: Three times a day (TID) | CUTANEOUS | Status: DC | PRN
  Administered 2021-11-16: 12:00:00 2 g via TOPICAL
  Filled 2021-11-16: qty 100

## 2021-11-16 MED ORDER — ALBUTEROL SULFATE HFA 108 (90 BASE) MCG/ACT IN AERS: 2.0000 | INHALATION_SPRAY | Freq: Four times a day (QID) | RESPIRATORY_TRACT | Status: DC | PRN

## 2021-11-16 MED ORDER — ALBUTEROL SULFATE HFA 108 (90 BASE) MCG/ACT IN AERS
2.0000 | INHALATION_SPRAY | Freq: Four times a day (QID) | RESPIRATORY_TRACT | Status: DC | PRN
  Administered 2021-11-16 (×2): 2 via RESPIRATORY_TRACT
  Filled 2021-11-16: qty 6.7

## 2021-11-16 NOTE — Progress Notes (Signed)
PROGRESS NOTE                                                                                                                                                                                                             Patient Demographics:    Robert Rowe, is a 87 y.o. 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 lower abdominal discomfort in the hospital he was diagnosed with pyelonephritis on the left side due to a past left ureteric stone.  He also had sepsis and subsequently went into A. fib RVR, due to RVR developed NSTEMI, acute on chronic systolic CHF, hypotension from stunned myocardium and AKI on 11/10/2021.  He came under my care early morning 11/11/2021 at 7 AM.   Subjective:   Patient in bed, appears comfortable, denies any headache, no fever, no chest pain or pressure, no shortness of breath , no abdominal pain. No new focal weakness.   Assessment  & Plan :     Sepsis due to left-sided pyelonephritis caused by a passed left ureteral stone - he been placed on IV fluids along with empiric IV antibiotics, cultures so far negative, sepsis pathophysiology is improving, continue antibiotics stone likely has passed as per CT scan.  2.  Paroxysmal A. fib RVR with NSTEMI, [place] vas 2 score of greater than 5.  He is now on rate control with amiodarone initially drip now oral, oral Eliquis for anticoagulation, blood pressure too low to tolerate beta-blocker or ACE/ARB, overall remains tenuous.    3. Acute systolic and diastolic  CHF kindly see #2 above - EF 45% .  Gentle diuresis as tolerated by blood pressure PRN.  Midodrine to augment blood pressure.  Cannot use B-Blocker, ACE and ARB for underlying CKD 4  and hypotension due to NSTEMI and stunned myocardium, blood pressure finally improving on 11/16/2021.   4.  Dyslipidemia.  On statin.  5.  HX of stroke with right-sided weakness.  We will stop Plavix and switch him to Eliquis.  6.  GERD.  PPI.  7.  Hypothyroidism.  On Synthroid.  Stable TSH.  8.  Mildly prolonged QTC.  Give magnesium monitor electrolytes.  When blood pressure improves low-dose beta-blocker.  9. AKI on CKD 4.  Baseline creatinine around 1.8, AKI due to hypotension from stunned myocardium, midodrine to augment blood pressure, tight balance between as needed fluids and Lasix based on symptoms.  Blood pressure now finally improving.  Overall extremely tenuous due to sepsis, pyelonephritis, A. fib RVR and NSTEMI with hypotension.  Continue medical treatment, DNR, if declines further then comfort measures.       Condition - Extremely Guarded  Family Communication  : Son and wife bedside on 11/11/2021, son 11/12/21, wife 11/13/21, wife 11/14/21, son 11/15/21, son and wife bedside 11/16/2021  Note patient's wife and son are not in agreement with patient's disposition.  We cannot intervene in family dynamics, once medically ready we will inform of discharge, PT to evaluate here.  Code Status : DNR  Consults  : Palliative care  PUD Prophylaxis : PPI   Procedures  :     CT - 1. Mild left hydroureter with associated periureteral fat stranding. Mild perivesicular fat stranding. No definite ureterolithiasis. Bilateral nonobstructive punctate nephrolithiasis. Finding likely represents a recently passed stone. Differential diagnosis includes chronic changes of obstructive uropathy or reflux. Recommend correlation with urinalysis to evaluate for superimposed infection. 2. Interval development of an L1  compression fracture with greater than 25% vertebral body height loss. Correlate with point tenderness to palpation to evaluate for an acute abnormality. 3. Partially visualized large hiatal hernia containing the entire stomach. No findings of associated bowel obstruction. Other imaging findings of potential clinical significance: 1. Scattered colonic diverticulosis with no acute diverticulitis. 2. Diffuse decreased bone density with interval worsening of multilevel severe degenerative changes of the spine. 3. Distal infrarenal abdominal aorta aneurysm (3 cm). Recommend follow-up ultrasound every 3 years. This recommendation follows ACR consensus guidelines: White Paper of the ACR Incidental Findings Committee II on Vascular Findings.  TTE -  1. Left ventricular ejection fraction, by estimation, is 40 to 45%. The left ventricle has mildly decreased function. The left ventricle demonstrates regional wall motion abnormalities (see scoring diagram/findings for description). Left ventricular diastolic parameters are consistent with Grade II diastolic dysfunction (pseudonormalization).  2. Right ventricular systolic function is moderately reduced. The right ventricular size is not well visualized. There is moderately elevated pulmonary artery systolic pressure.  3. The mitral valve was not well visualized. Mild to moderate mitral valve regurgitation. Moderate mitral annular calcification.  4. Tricuspid valve regurgitation is moderate.  5. The aortic valve was not well visualized. Aortic valve regurgitation is not visualized      Disposition Plan  :    Status is: Inpatient  Remains inpatient appropriate because: Pyelonephritis, NSTEMI, A. fib RVR   DVT Prophylaxis  :    apixaban (ELIQUIS) tablet 2.5 mg Start: 11/13/21 1400 apixaban (ELIQUIS) tablet 2.5 mg    Lab Results  Component Value Date   PLT 234 11/16/2021    Diet :  Diet Order             Diet Heart Room service appropriate? Yes; Fluid  consistency: Thin  Diet effective now                    Inpatient Medications  Scheduled Meds:  albuterol  2.5 mg Nebulization BID   amiodarone  200  mg Oral BID   apixaban  2.5 mg Oral BID   aspirin  81 mg Oral Daily   atorvastatin  40 mg Oral Daily   fentaNYL (SUBLIMAZE) injection  50 mcg Intravenous Once   latanoprost  1 drop Both Eyes QHS   levothyroxine  50 mcg Oral Q0600   midodrine  10 mg Oral TID WC   pantoprazole  40 mg Oral Daily   Continuous Infusions:  cefTRIAXone (ROCEPHIN)  IV 2 g (11/16/21 0219)   lactated ringers     PRN Meds:.acetaminophen **OR** acetaminophen, albuterol, albuterol, nitroGLYCERIN, senna-docusate  Antibiotics  :    Anti-infectives (From admission, onward)    Start     Dose/Rate Route Frequency Ordered Stop   11/11/21 0200  cefTRIAXone (ROCEPHIN) 2 g in sodium chloride 0.9 % 100 mL IVPB        2 g 200 mL/hr over 30 Minutes Intravenous Every 24 hours 11/10/21 0814     11/10/21 1145  cefTRIAXone (ROCEPHIN) 2 g in sodium chloride 0.9 % 100 mL IVPB  Status:  Discontinued        2 g 200 mL/hr over 30 Minutes Intravenous Every 24 hours 11/10/21 1138 11/10/21 1145   11/10/21 0000  cefTRIAXone (ROCEPHIN) 2 g in sodium chloride 0.9 % 100 mL IVPB        2 g 200 mL/hr over 30 Minutes Intravenous  Once 11/09/21 2354 11/10/21 0115        Time Spent in minutes  30   Lala Lund M.D on 11/16/2021 at 9:15 AM  To page go to [email or web address]   [facility] -  Office  [phone]  See all Orders from today for further details    Objective:   Vitals:   11/16/21 0014 11/16/21 0443 11/16/21 0728 11/16/21 0745  BP: 96/61 (!) 86/74 (!) 99/59   Pulse: 72 75 80   Resp: 20 20 20    Temp: 98.8 F (37.1 C) 98.4 F (36.9 C) 97.9 F (36.6 C)   TempSrc: Oral Oral Oral   SpO2: 95% 96% 97% 98%  Weight:      Height:        Wt Readings from Last 3 Encounters:  11/15/21 68.5 kg  04/15/21 66.4 kg  09/11/20 63.5 kg     Intake/Output  Summary (Last 24 hours) at 11/16/2021 0915 Last data filed at 11/16/2021 0444 Gross per 24 hour  Intake 1140.47 ml  Output 1200 ml  Net -59.53 ml     Physical Exam  Awake Alert, No new F.N deficits, Normal affect [place].AT,PERRAL Supple Neck, No JVD,   Symmetrical Chest wall movement, Good air movement bilaterally, mild wheezing ( baseline) RRR,No Gallops, Rubs or new Murmurs,  +ve B.Sounds, Abd Soft, No tenderness,   No Cyanosis, Clubbing or edema     Data Review:    CBC Recent Labs  Lab 11/12/21 0134 11/13/21 0159 11/14/21 0018 11/15/21 0310 11/16/21 0212  WBC 12.4* 11.4* 8.6 9.5 8.8  HGB 9.9* 10.3* 10.3* 9.9* 9.8*  HCT 30.3* 32.0* 31.5* 30.5* 31.3*  PLT 223 233 216 227 234  MCV 98.4 99.7 99.4 97.8 100.0  MCH 32.1 32.1 32.5 31.7 31.3  MCHC 32.7 32.2 32.7 32.5 31.3  RDW 14.0 14.1 14.2 14.0 14.1  LYMPHSABS 2.1 2.2 1.7 2.0 1.9  MONOABS 1.0 1.0 0.8 0.9 0.8  EOSABS 0.1 0.3 0.2 0.2 0.2  BASOSABS 0.1 0.1 0.1 0.1 0.1    Electrolytes Recent Labs  Lab 11/09/21 2047 11/10/21 0038 11/10/21 0343 11/10/21  2409 11/10/21 0830 11/11/21 0536 11/11/21 0951 11/11/21 0951 11/12/21 0134 11/13/21 0159 11/14/21 0018 11/15/21 0310 11/16/21 0212  NA  --   --   --   --    < > 137  --   --  138 138 138 139 139  K  --   --   --   --    < > 3.7  --   --  4.0 3.9 4.1 4.1 3.9  CL  --   --   --   --    < > 107  --   --  105 102 103 104 104  CO2  --   --   --   --    < > 22  --   --  23 26 25 24 25   GLUCOSE  --   --   --   --    < > 126*  --   --  132* 128* 113* 108* 113*  BUN  --   --   --   --    < > 30*  --   --  28* 26* 26* 24* 23  CREATININE  --   --   --   --    < > 2.07*  --   --  2.17* 2.37* 2.43* 2.53* 2.61*  CALCIUM  --   --   --   --    < > 8.0*  --   --  8.2* 8.3* 8.3* 8.3* 8.3*  AST  --   --   --   --    < >  --   --   --  21 18 15 19 19   ALT  --   --   --   --    < >  --   --   --  12 12 13 12 14   ALKPHOS  --   --   --   --    < >  --   --   --  52 57 61 53 60   BILITOT  --   --   --   --    < >  --   --   --  0.5 0.3 0.3 0.7 0.6  ALBUMIN  --   --   --   --    < >  --   --   --  2.4* 2.4* 2.4* 2.1* 2.4*  MG  --   --   --   --   --   --   --   --  1.9 2.0 1.9 1.9 1.9  LATICACIDVEN 2.5* 2.5* 3.0* 2.6*  --  1.2  --   --   --   --   --   --   --   TSH  --   --   --   --   --   --  3.305  --   --   --   --   --   --   BNP  --   --   --   --   --   --  532.2*   < > 856.1* 551.9* 664.4* 734.0* 556.5*   < > = values in this interval not displayed.    ------------------------------------------------------------------------------------------------------------------ No results for input(s): CHOL, HDL, LDLCALC, TRIG, CHOLHDL, LDLDIRECT in the last 72 hours.  Lab Results  Component Value Date   HGBA1C 6.0 (H) 05/13/2019    No results for  input(s): TSH, T4TOTAL, T3FREE, THYROIDAB in the last 72 hours.  Invalid input(s): FREET3  ------------------------------------------------------------------------------------------------------------------ ID Labs Recent Labs  Lab 11/09/21 2047 11/10/21 0038 11/10/21 0343 11/10/21 0641 11/10/21 0830 11/11/21 0536 11/12/21 0134 11/13/21 0159 11/14/21 0018 11/15/21 0310 11/16/21 0212  WBC  --   --   --   --    < > 12.1* 12.4* 11.4* 8.6 9.5 8.8  PLT  --   --   --   --    < > 206 223 233 216 227 234  LATICACIDVEN 2.5* 2.5* 3.0* 2.6*  --  1.2  --   --   --   --   --   CREATININE  --   --   --   --    < > 2.07* 2.17* 2.37* 2.43* 2.53* 2.61*   < > = values in this interval not displayed.       Micro Results Recent Results (from the past 240 hour(s))  Urine Culture     Status: Abnormal   Collection Time: 11/09/21  7:10 PM   Specimen: Urine, Clean Catch  Result Value Ref Range Status   Specimen Description URINE, CLEAN CATCH  Final   Special Requests   Final    NONE Performed at [facility], [address] [place], [place] [address]    Culture MULTIPLE SPECIES PRESENT, SUGGEST RECOLLECTION (A)   Final   Report Status 11/11/2021 FINAL  Final  Culture, blood (single)     Status: None   Collection Time: 11/10/21 12:13 AM   Specimen: BLOOD  Result Value Ref Range Status   Specimen Description BLOOD RIGHT ANTECUBITAL  Final   Special Requests   Final    BOTTLES DRAWN AEROBIC AND ANAEROBIC Blood Culture results may not be optimal due to an inadequate volume of blood received in culture bottles   Culture   Final    NO GROWTH 5 DAYS Performed at [facility], [place] [address] [place], [place] [address]    Report Status 11/15/2021 FINAL  Final  Culture, blood (routine x 2)     Status: None   Collection Time: 11/10/21  8:30 AM   Specimen: BLOOD  Result Value Ref Range Status   Specimen Description BLOOD LEFT ANTECUBITAL  Final   Special Requests   Final    BOTTLES DRAWN AEROBIC AND ANAEROBIC Blood Culture results may not be optimal due to an inadequate volume of blood received in culture bottles   Culture   Final    NO GROWTH 5 DAYS Performed at [facility], [place] [address], [place], [place] [address]    Report Status 11/15/2021 FINAL  Final  Culture, blood (routine x 2)     Status: None   Collection Time: 11/10/21  8:46 AM   Specimen: BLOOD  Result Value Ref Range Status   Specimen Description BLOOD RIGHT ANTECUBITAL  Final   Special Requests   Final    BOTTLES DRAWN AEROBIC AND ANAEROBIC Blood Culture adequate volume   Culture   Final    NO GROWTH 5 DAYS Performed at [facility], [place] [address], [place],  [address]    Report Status 11/15/2021 FINAL  Final    Radiology Reports DG Chest Port 1 View  Result Date: 11/15/2021 CLINICAL DATA:  Shortness of breath EXAM: PORTABLE CHEST 1 VIEW COMPARISON:  11/14/2021 FINDINGS: Small layering left pleural effusion. Associated bilateral lower lobe atelectasis. Low lung volumes. No frank interstitial edema.  No pneumothorax. The heart is normal in size. IMPRESSION: Small layering left pleural effusion with  associated bilateral lower lobe atelectasis. Electronically Signed   By: Julian Hy M.D.   On: 11/15/2021 06:41   DG Chest Port 1 View  Result Date: 11/14/2021 CLINICAL DATA:  Shortness of breath EXAM: PORTABLE CHEST 1 VIEW COMPARISON:  11/12/2021 FINDINGS: Heart is normal size. Bilateral lower lobe opacities. Some improvement on the right since prior study. Findings similar on the left with layering left effusion. No acute bony abnormality. IMPRESSION: Layering left effusion with stable left basilar atelectasis or infiltrate. Improving right base atelectasis. Electronically Signed   By: Rolm Baptise M.D.   On: 11/14/2021 08:43

## 2021-11-17 ENCOUNTER — Other Ambulatory Visit (HOSPITAL_COMMUNITY): Payer: Self-pay

## 2021-11-17 DIAGNOSIS — N12 Tubulo-interstitial nephritis, not specified as acute or chronic: Secondary | ICD-10-CM | POA: Diagnosis not present

## 2021-11-17 DIAGNOSIS — A419 Sepsis, unspecified organism: Principal | ICD-10-CM

## 2021-11-17 DIAGNOSIS — I5032 Chronic diastolic (congestive) heart failure: Secondary | ICD-10-CM | POA: Diagnosis not present

## 2021-11-17 DIAGNOSIS — N179 Acute kidney failure, unspecified: Secondary | ICD-10-CM

## 2021-11-17 DIAGNOSIS — N189 Chronic kidney disease, unspecified: Secondary | ICD-10-CM

## 2021-11-17 LAB — CBC WITH DIFFERENTIAL/PLATELET
Abs Immature Granulocytes: 0.03 10*3/uL (ref 0.00–0.07)
Basophils Absolute: 0.1 10*3/uL (ref 0.0–0.1)
Basophils Relative: 1 %
Eosinophils Absolute: 0.3 10*3/uL (ref 0.0–0.5)
Eosinophils Relative: 3 %
HCT: 29.7 % — ABNORMAL LOW (ref 39.0–52.0)
Hemoglobin: 9.4 g/dL — ABNORMAL LOW (ref 13.0–17.0)
Immature Granulocytes: 0 %
Lymphocytes Relative: 22 %
Lymphs Abs: 2.1 10*3/uL (ref 0.7–4.0)
MCH: 31.6 pg (ref 26.0–34.0)
MCHC: 31.6 g/dL (ref 30.0–36.0)
MCV: 100 fL (ref 80.0–100.0)
Monocytes Absolute: 0.9 10*3/uL (ref 0.1–1.0)
Monocytes Relative: 10 %
Neutro Abs: 6.2 10*3/uL (ref 1.7–7.7)
Neutrophils Relative %: 64 %
Platelets: 262 10*3/uL (ref 150–400)
RBC: 2.97 MIL/uL — ABNORMAL LOW (ref 4.22–5.81)
RDW: 14.2 % (ref 11.5–15.5)
WBC: 9.7 10*3/uL (ref 4.0–10.5)
nRBC: 0 % (ref 0.0–0.2)

## 2021-11-17 LAB — COMPREHENSIVE METABOLIC PANEL
ALT: 13 U/L (ref 0–44)
AST: 15 U/L (ref 15–41)
Albumin: 2.4 g/dL — ABNORMAL LOW (ref 3.5–5.0)
Alkaline Phosphatase: 61 U/L (ref 38–126)
Anion gap: 9 (ref 5–15)
BUN: 20 mg/dL (ref 8–23)
CO2: 26 mmol/L (ref 22–32)
Calcium: 8.3 mg/dL — ABNORMAL LOW (ref 8.9–10.3)
Chloride: 104 mmol/L (ref 98–111)
Creatinine, Ser: 2.62 mg/dL — ABNORMAL HIGH (ref 0.61–1.24)
GFR, Estimated: 21 mL/min — ABNORMAL LOW (ref >60)
Glucose, Bld: 122 mg/dL — ABNORMAL HIGH (ref 70–99)
Potassium: 3.9 mmol/L (ref 3.5–5.1)
Sodium: 139 mmol/L (ref 135–145)
Total Bilirubin: 0.6 mg/dL (ref 0.3–1.2)
Total Protein: 5.1 g/dL — ABNORMAL LOW (ref 6.5–8.1)

## 2021-11-17 LAB — MAGNESIUM: Magnesium: 2.1 mg/dL (ref 1.7–2.4)

## 2021-11-17 LAB — BRAIN NATRIURETIC PEPTIDE: B Natriuretic Peptide: 697.2 pg/mL — ABNORMAL HIGH (ref 0.0–100.0)

## 2021-11-17 MED ORDER — AMIODARONE HCL 200 MG PO TABS
ORAL_TABLET | ORAL | 0 refills | Status: DC
  Filled 2021-11-17: qty 35, 30d supply, fill #0

## 2021-11-17 MED ORDER — CEPHALEXIN 250 MG PO CAPS
250.0000 mg | ORAL_CAPSULE | Freq: Every day | ORAL | 0 refills | Status: AC
  Filled 2021-11-17: qty 3, 3d supply, fill #0

## 2021-11-17 MED ORDER — MIDODRINE HCL 10 MG PO TABS
10.0000 mg | ORAL_TABLET | Freq: Two times a day (BID) | ORAL | 0 refills | Status: DC
  Filled 2021-11-17: qty 60, 30d supply, fill #0

## 2021-11-17 MED ORDER — APIXABAN 2.5 MG PO TABS
2.5000 mg | ORAL_TABLET | Freq: Two times a day (BID) | ORAL | 0 refills | Status: DC
  Filled 2021-11-17: qty 60, 30d supply, fill #0

## 2021-11-17 NOTE — Progress Notes (Signed)
Pharmacist Heart Failure Core Measure Documentation  Assessment: Robert Rowe has an EF documented as 40% on 11/11/21 by echo.  Rationale: Heart failure patients with left ventricular systolic dysfunction (LVSD) and an EF < 40% should be prescribed an angiotensin converting enzyme inhibitor (ACEI) or angiotensin receptor blocker (ARB) at discharge unless a contraindication is documented in the medical record.  This patient is not currently on an ACEI or ARB for HF.  This note is being placed in the record in order to provide documentation that a contraindication to the use of these agents is present for this encounter.  Reason why ACE Inhibitor or Angiotensin Receptor Blocker is contraindicated (specify all that apply)  []   ACEI allergy AND ARB allergy []   Angioedema []   Moderate or severe aortic stenosis []   Hyperkalemia [x]   Hypotension []   Renal artery stenosis [x]   Worsening renal function, preexisting renal disease or dysfunction   Ursula Beath 11/17/2021 12:56 PM

## 2021-11-17 NOTE — Discharge Summary (Signed)
Physician Discharge Summary  Robert Rowe ZOX:[id number] DOB: January 12, 1918 DOA: 11/09/2021  PCP: [name], MD  Admit date: 11/09/2021 Discharge date: 11/17/2021  Admitted From: Home Disposition:  Home   Recommendations for Outpatient Follow-up:  Follow up with PCP in 1-2 weeks Please obtain BMP/CBC in one week Please follow up with cardiology, discussed with cardiology coordinator to arrange for outpatient follow-up.  Home Health:YES  Discharge Condition:Stable CODE STATUS: DNR Diet recommendation:  Regular   Brief/Interim Summary:  Sepsis due to left-sided pyelonephritis caused by a passed left ureteral stone -sepsis present on admission, he was treated empirically with IV Rocephin, blood cultures so far negative, urine cultures unhelpful as growing multiple species, sepsis has resolved, he will be discharged on another 3 days of oral Keflex to finish the days of treatment.   -stone likely has passed as per CT scan.   Paroxysmal A. fib RVR with NSTEMI, [place] vas 2 score of greater than 5.  He is now on rate control with amiodarone initially drip now oral, will be discharged on amiodarone 200 mg oral twice daily x5 days, then decrease to 100 mg oral daily.  He is started on oral anticoagulation Eliquis on discharge . -Patient is currently in sinus rhythm on telemetry at time of discharge -Blood pressure is soft, no room for beta-blockers, ACE or ARB .  He will be discharged on low-dose midodrine -Follow-up with cardiology as an outpatient regarding further recommendation, discussed with cardiology coordinator to arrange for outpatient follow-up.   HX of stroke with right-sided weakness.  We will stop Plavix and switch him to Eliquis.   Dyslipidemia.  On statin.   Acute systolic and diastolic CHF kindly see #2 above - EF 45% .  Gentle diuresis as tolerated by blood pressure.  Midodrine to augment blood pressure.  Cannot use B-Blocker, ACE and ARB for underlying CKD  and  hypotension., in the interim have to give some IV fluids as well as he gets profoundly hypotensive due to stunned myocardium. -No evidence of volume overload on discharge, no indication for Lasix   GERD.  PPI.   Hypothyroidism.  On Synthroid.  Stable TSH.     AKI on CKD 4.  Baseline creatinine around 1.8.  Creatinine has been trending up during hospital stay, but it did plateau around 2.6, avoid nephrotoxic medication, avoid hypotension, repeat BMP as an outpatient and 1 week  Goals of care: -Overall patient is very frail, weak, and deconditioned, will arrange for home health including PT/OT, he was seen by palliative medicine, palliative medicine to continue following as an outpatient  Discharge Diagnoses:  Principal Problem:   Pyelonephritis Active Problems:   Chronic diastolic CHF (congestive heart failure) (HCC)   Sepsis ([facility])   Acute kidney injury superimposed on CKD ([facility])   Prolonged QT interval   History of CVA (cerebrovascular accident)    Discharge Instructions  Discharge Instructions     Discharge instructions   Complete by: As directed    Follow with Primary MD [name], MD in 7 days   Get CBC, CMP,  checked  by Primary MD next visit.    Activity: As tolerated with Full fall precautions use walker/cane & assistance as needed   Disposition Home    Diet: Regular diet , with feeding assistance and aspiration precautions.   On your next visit with your primary care physician please Get Medicines reviewed and adjusted.   Please request your Prim.MD to go over all Hospital Tests and Procedure/Radiological  results at the follow up, please get all Hospital records sent to your Prim MD by signing hospital release before you go home.   If you experience worsening of your admission symptoms, develop shortness of breath, life threatening emergency, suicidal or homicidal thoughts you must seek medical attention immediately by calling 911 or calling your MD  immediately  if symptoms less severe.  You Must read complete instructions/literature along with all the possible adverse reactions/side effects for all the Medicines you take and that have been prescribed to you. Take any new Medicines after you have completely understood and accpet all the possible adverse reactions/side effects.   Do not drive, operating heavy machinery, perform activities at heights, swimming or participation in water activities or provide baby sitting services if your were admitted for syncope or siezures until you have seen by Primary MD or a Neurologist and advised to do so again.  Do not drive when taking Pain medications.    Do not take more than prescribed Pain, Sleep and Anxiety Medications  Special Instructions: If you have smoked or chewed Tobacco  in the last 2 yrs please stop smoking, stop any regular Alcohol  and or any Recreational drug use.  Wear Seat belts while driving.   Please note  You were cared for by a hospitalist during your hospital stay. If you have any questions about your discharge medications or the care you received while you were in the hospital after you are discharged, you can call the unit and asked to speak with the hospitalist on call if the hospitalist that took care of you is not available. Once you are discharged, your primary care physician will handle any further medical issues. Please note that NO REFILLS for any discharge medications will be authorized once you are discharged, as it is imperative that you return to your primary care physician (or establish a relationship with a primary care physician if you do not have one) for your aftercare needs so that they can reassess your need for medications and monitor your lab values.   Increase activity slowly   Complete by: As directed    No wound care   Complete by: As directed       Allergies as of 11/17/2021       Reactions   Codeine Other (See Comments)   Patient was "all  over the place"- undesired side effect   Sulfasalazine Other (See Comments)   Reaction not recalled   Sulfonamide Derivatives Other (See Comments)   Reaction not recalled        Medication List     STOP taking these medications    clopidogrel 75 MG tablet Commonly known as: PLAVIX   furosemide 20 MG tablet Commonly known as: Lasix   metoprolol succinate 25 MG 24 hr tablet Commonly known as: TOPROL-XL       TAKE these medications    acetaminophen 325 MG tablet Commonly known as: TYLENOL Take 650 mg by mouth at bedtime.   albuterol 108 (90 Base) MCG/ACT inhaler Commonly known as: VENTOLIN HFA INHALE 2 PUFFS INTO THE LUNGS EVERY 6 HOURS AS NEEDED FOR WHEEZING (COUGH).   amiodarone 200 MG tablet Commonly known as: PACERONE Please use 200 mg oral twice daily for 5 days, then decrease to 200 mg oral daily after that.   apixaban 2.5 MG Tabs tablet Commonly known as: ELIQUIS Take 1 tablet (2.5 mg total) by mouth 2 (two) times daily.   atorvastatin 40 MG tablet Commonly known as:  LIPITOR TAKE 1 TABLET (40 MG TOTAL) BY MOUTH DAILY AT 6 PM. What changed: additional instructions   ketoconazole 2 % cream Commonly known as: NIZORAL APPLY 1 APPLICATION TOPICALLY 2 (TWO) TIMES DAILY AS NEEDED FOR IRRITATION.   latanoprost 0.005 % ophthalmic solution Commonly known as: XALATAN Place 1 drop into both eyes at bedtime. For Glaucoma   levothyroxine 50 MCG tablet Commonly known as: SYNTHROID TAKE 1 TABLET BY MOUTH  DAILY   magnesium hydroxide 400 MG/5ML suspension Commonly known as: MILK OF MAGNESIA Take 30 mLs by mouth See admin instructions. Constipation (1 of 4 ) If no BM in 3 days, give 30 cc Milk of Magnesium p.o. x 1 dose in 24 hours as needed ( Do  Not use standing constipation orders for residents with renal failure CFR less than 30. Contact MD for orders)   meclizine 12.5 MG tablet Commonly known as: ANTIVERT Take 1 tablet (12.5 mg total) by mouth 3 (three) times  daily as needed for dizziness.   midodrine 10 MG tablet Commonly known as: PROAMATINE Take 1 tablet (10 mg total) by mouth 2 (two) times daily with a meal.   nitroGLYCERIN 0.4 MG SL tablet Commonly known as: NITROSTAT Place 1 tablet (0.4 mg total) under the tongue every 5 (five) minutes as needed (for Esophageal spasms).   pantoprazole 40 MG tablet Commonly known as: PROTONIX TAKE 1 TABLET BY MOUTH EVERY DAY   senna-docusate 8.6-50 MG tablet Commonly known as: Senokot-S Take 1 tablet by mouth at bedtime as needed for moderate constipation.   triamcinolone cream 0.1 % Commonly known as: KENALOG APPLY TO AFFECTED RASH TWICE DAILY AS NEEDED. What changed: See the new instructions.        Follow-up Information     [facility].   Why: For outpatient palliative medicine. They will contact you by phone for first appointment Contact information: [place]        [name], MD Follow up.   Specialty: Family Medicine Contact information: [place] [address] [phone]         Pixie Casino, MD .   Specialty: Cardiology Contact information: [address] [place] [address] [phone]                Allergies  Allergen Reactions   Codeine Other (See Comments)    Patient was "all over the place"- undesired side effect   Sulfasalazine Other (See Comments)    Reaction not recalled   Sulfonamide Derivatives Other (See Comments)    Reaction not recalled    Consultations: Palliative medicine   Procedures/Studies: CT ABDOMEN PELVIS WO CONTRAST  Result Date: 11/09/2021 CLINICAL DATA:  Abdominal pain, acute, nonlocalize. Sudden onset left lower quadrant and central abdominal pain. Labs will not allow dissection study initially. Rule out obstruction verse diverticulitis versus aorta versus bowel obstruction versus other EXAM: CT  ABDOMEN AND PELVIS WITHOUT CONTRAST TECHNIQUE: Multidetector CT imaging of the abdomen and pelvis was performed following the standard protocol without IV contrast. COMPARISON:  CT abdomen pelvis 01/12/2019 FINDINGS: Lower chest: Bronchial wall thickening of the right lower lobe. Four-vessel coronary calcifications. Partially visualized large hiatal hernia containing the entire stomach. Hepatobiliary: No focal liver abnormality. No gallstones, gallbladder wall thickening, or pericholecystic fluid. No biliary dilatation. Pancreas: No focal lesion. Normal pancreatic contour. No surrounding inflammatory changes. No main pancreatic ductal dilatation. Spleen: Normal in size without focal abnormality. Adrenals/Urinary Tract: No  adrenal nodule bilaterally. Bilateral punctate nephrolithiasis. No hydronephrosis. Fluid density lesions within the kidneys likely represent simple renal cysts. Subcentimeter hypodensities are too small to characterize. No ureterolithiasis. No right ureterolithiasis. Mild left hydroureter with associated fat stranding surrounding the ureter. Mild perivesicular fat stranding. Otherwise the urinary bladder is unremarkable. Possible TURP procedure surgical changes. Stomach/Bowel: Otherwise stomach is within normal limits. No pneumatosis. No evidence of bowel wall thickening or dilatation. Scattered colonic diverticulosis. Appendix appears normal. Vascular/Lymphatic: Distal intervened abdominal aorta measures at the upper limits of normal (3 cm). No iliac aneurysm. Mild atherosclerotic plaque of the aorta and its branches. No abdominal, pelvic, or inguinal lymphadenopathy. Reproductive: Prominent prostate. Other: No intraperitoneal free fluid. No intraperitoneal free gas. No organized fluid collection. Musculoskeletal: No abdominal wall hernia or abnormality. Diffusely decreased bone density. No suspicious lytic or blastic osseous lesions. Interval development of an L1 compression fracture with greater  than 25% vertebral body height loss. Interval worsening of multilevel severe degenerative changes of the spine. IMPRESSION: 1. Mild left hydroureter with associated periureteral fat stranding. Mild perivesicular fat stranding. No definite ureterolithiasis. Bilateral nonobstructive punctate nephrolithiasis. Finding likely represents a recently passed stone. Differential diagnosis includes chronic changes of obstructive uropathy or reflux. Recommend correlation with urinalysis to evaluate for superimposed infection. 2. Interval development of an L1 compression fracture with greater than 25% vertebral body height loss. Correlate with point tenderness to palpation to evaluate for an acute abnormality. 3. Partially visualized large hiatal hernia containing the entire stomach. No findings of associated bowel obstruction. Other imaging findings of potential clinical significance: 1. Scattered colonic diverticulosis with no acute diverticulitis. 2. Diffuse decreased bone density with interval worsening of multilevel severe degenerative changes of the spine. 3. Distal infrarenal abdominal aorta aneurysm (3 cm). Recommend follow-up ultrasound every 3 years. This recommendation follows ACR consensus guidelines: White Paper of the ACR Incidental Findings Committee II on Vascular Findings. J Am Coll Radiol 2013; 43:154-008. 4. Aortic Atherosclerosis (ICD10-I70.0). Aortic aneurysm NOS (ICD10-I71.9). Electronically Signed   By: Iven Finn M.D.   On: 11/09/2021 23:33   DG Chest Port 1 View  Result Date: 11/15/2021 CLINICAL DATA:  Shortness of breath EXAM: PORTABLE CHEST 1 VIEW COMPARISON:  11/14/2021 FINDINGS: Small layering left pleural effusion. Associated bilateral lower lobe atelectasis. Low lung volumes. No frank interstitial edema. No pneumothorax. The heart is normal in size. IMPRESSION: Small layering left pleural effusion with associated bilateral lower lobe atelectasis. Electronically Signed   By: Julian Hy M.D.   On: 11/15/2021 06:41   DG Chest Port 1 View  Result Date: 11/14/2021 CLINICAL DATA:  Shortness of breath EXAM: PORTABLE CHEST 1 VIEW COMPARISON:  11/12/2021 FINDINGS: Heart is normal size. Bilateral lower lobe opacities. Some improvement on the right since prior study. Findings similar on the left with layering left effusion. No acute bony abnormality. IMPRESSION: Layering left effusion with stable left basilar atelectasis or infiltrate. Improving right base atelectasis. Electronically Signed   By: Rolm Baptise M.D.   On: 11/14/2021 08:43   DG Chest Port 1 View  Result Date: 11/12/2021 CLINICAL DATA:  Shortness of breath with wheezing and cough. EXAM: PORTABLE CHEST 1 VIEW COMPARISON:  11/11/2021 FINDINGS: 0809 hours. Low volumes. Bibasilar collapse/consolidation with small bilateral pleural effusions. The cardio pericardial silhouette is enlarged. The visualized bony structures of the thorax show no acute abnormality. Telemetry leads overlie the chest. IMPRESSION: Mild progression of bibasilar collapse/consolidation with small bilateral pleural effusions. Electronically Signed   By: Verda Cumins.D.  On: 11/12/2021 08:31   DG Chest Port 1 View  Result Date: 11/11/2021 CLINICAL DATA:  Shortness of breath EXAM: PORTABLE CHEST 1 VIEW COMPARISON:  04/13/2021 FINDINGS: Heart is normal size. Bibasilar airspace opacities are similar prior study. Hiatal hernia noted. No effusions or acute bony abnormality. IMPRESSION: Chronic bibasilar opacities, similar prior study. Favor scarring or atelectasis. Infiltrate cannot be completely excluded at the left lung base. Moderate-sized hiatal hernia. Electronically Signed   By: Rolm Baptise M.D.   On: 11/11/2021 10:02   ECHOCARDIOGRAM COMPLETE  Result Date: 11/11/2021    ECHOCARDIOGRAM REPORT   Patient Name:   [name] Date of Exam: 11/11/2021 Medical Rec #:  [id number]         Height:       65.0 in Accession #:    [id number]         Weight:       154.3 lb Date of Birth:  Apr 08, 1918        BSA:          1.772 m Patient Age:    87 years         BP:           99/45 mmHg Patient Gender: M                 HR:           86 bpm. Exam Location:  Inpatient Procedure: 2D Echo, Cardiac Doppler, Color Doppler and Intracardiac            Opacification Agent                        STAT ECHO Reported to: Dr Lala Lund on 11/11/2021 2:00:00 PM               New WMA and elevated RVSP. Indications:    NSTEMI I21.4  History:        Patient has prior history of Echocardiogram examinations, most                 recent 04/14/2021. CHF, Arrythmias:Atrial Fibrillation,                 Tachycardia and Mildly prolonged QTC; Risk Factors:Dyslipidemia                 and Hypertension. Hypothyroidism.  Sonographer:    Darlina Sicilian RDCS Referring Phys: Arnell Asal Margaree Mackintosh Mabel  1. Left ventricular ejection fraction, by estimation, is 40 to 45%. The left ventricle has mildly decreased function. The left ventricle demonstrates regional wall motion abnormalities (see scoring diagram/findings for description). Left ventricular diastolic parameters are consistent with Grade II diastolic dysfunction (pseudonormalization).  2. Right ventricular systolic function is moderately reduced. The right ventricular size is not well visualized. There is moderately elevated pulmonary artery systolic pressure.  3. The mitral valve was not well visualized. Mild to moderate mitral valve regurgitation. Moderate mitral annular calcification.  4. Tricuspid valve regurgitation is moderate.  5. The aortic valve was not well visualized. Aortic valve regurgitation is not visualized. Comparison(s): Changes from prior study are noted. Conclusion(s)/Recommendation(s): Even with use of echo contrast, very difficult images. There appears to be inferior/lateral LV wall motion abnormalities with mildly decreased LV function. The RV is not well seen, but function appears likely moderately  reduced, and there is elevated RVSP. FINDINGS  Left Ventricle: Left ventricular ejection fraction, by estimation, is 40 to 45%. The left ventricle has  mildly decreased function. The left ventricle demonstrates regional wall motion abnormalities. Definity contrast agent was given IV to delineate the left ventricular endocardial borders. The left ventricular internal cavity size was normal in size. There is no left ventricular hypertrophy. Left ventricular diastolic parameters are consistent with Grade II diastolic dysfunction (pseudonormalization).  LV Wall Scoring: The antero-lateral wall, inferior wall, and posterior wall are hypokinetic. The entire septum, entire apex, and basal anterior segment are normal. Right Ventricle: The right ventricular size is not well visualized. Right vetricular wall thickness was not well visualized. Right ventricular systolic function is moderately reduced. There is moderately elevated pulmonary artery systolic pressure. The tricuspid regurgitant velocity is 3.44 m/s, and with an assumed right atrial pressure of 8 mmHg, the estimated right ventricular systolic pressure is 12.4 mmHg. Left Atrium: Left atrial size was normal in size. Right Atrium: Right atrial size was normal in size. Pericardium: There is no evidence of pericardial effusion. Mitral Valve: The mitral valve was not well visualized. Moderate mitral annular calcification. Mild to moderate mitral valve regurgitation. Tricuspid Valve: The tricuspid valve is grossly normal. Tricuspid valve regurgitation is moderate. Aortic Valve: The aortic valve was not well visualized. Aortic valve regurgitation is not visualized. Pulmonic Valve: The pulmonic valve was not well visualized. Pulmonic valve regurgitation is not visualized. Aorta: The ascending aorta was not well visualized, the aortic arch was not well visualized and the aortic root is normal in size and structure. IAS/Shunts: The interatrial septum was not well visualized.   LEFT VENTRICLE PLAX 2D LVIDd:         4.80 cm      Diastology LVIDs:         3.50 cm      LV e' medial:    4.24 cm/s LV PW:         0.80 cm      LV E/e' medial:  30.9 LV IVS:        0.80 cm      LV e' lateral:   7.83 cm/s LVOT diam:     1.80 cm      LV E/e' lateral: 16.7 LV SV:         28 LV SV Index:   16 LVOT Area:     2.54 cm  LV Volumes (MOD) LV vol d, MOD A2C: 120.0 ml LV vol d, MOD A4C: 71.0 ml LV vol s, MOD A2C: 77.3 ml LV vol s, MOD A4C: 39.9 ml LV SV MOD A2C:     42.7 ml LV SV MOD A4C:     71.0 ml LV SV MOD BP:      37.8 ml RIGHT VENTRICLE RV S prime:     5.87 cm/s TAPSE (M-mode): 1.0 cm LEFT ATRIUM             Index        RIGHT ATRIUM           Index LA diam:        3.80 cm 2.14 cm/m   RA Area:     11.10 cm LA Vol (A2C):   22.3 ml 12.59 ml/m  RA Volume:   22.00 ml  12.42 ml/m LA Vol (A4C):   31.2 ml 17.61 ml/m LA Biplane Vol: 28.4 ml 16.03 ml/m  AORTIC VALVE LVOT Vmax:   61.50 cm/s LVOT Vmean:  42.700 cm/s LVOT VTI:    0.110 m  AORTA Ao Root diam: 3.20 cm MITRAL VALVE  TRICUSPID VALVE MV Area (PHT): 3.65 cm       TR Peak grad:   47.3 mmHg MV Decel Time: 208 msec       TR Vmax:        344.00 cm/s MR Peak grad:    78.5 mmHg MR Mean grad:    54.0 mmHg    SHUNTS MR Vmax:         443.00 cm/s  Systemic VTI:  0.11 m MR Vmean:        353.0 cm/s   Systemic Diam: 1.80 cm MR PISA:         0.57 cm MR PISA Eff ROA: 4 mm MR PISA Radius:  0.30 cm MV E velocity: 131.00 cm/s MV A velocity: 68.70 cm/s MV E/A ratio:  1.91 Buford Dresser MD Electronically signed by Buford Dresser MD Signature Date/Time: 11/11/2021/2:01:00 PM    Final       Subjective:  Acute events overnight as discussed with staff, patient reports generalized weakness and fatigue, and some lower extremity pain. Discharge Exam: Vitals:   11/17/21 0750 11/17/21 0831  BP: 110/64   Pulse: 73   Resp: 15   Temp: 98.7 F (37.1 C)   SpO2: (!) 81% 98%   Vitals:   11/16/21 2358 11/17/21 0417 11/17/21 0750  11/17/21 0831  BP: 99/73 (!) 104/52 110/64   Pulse: 73 70 73   Resp: 18 20 15    Temp: 97.9 F (36.6 C) 98.6 F (37 C) 98.7 F (37.1 C)   TempSrc: Oral Oral Oral   SpO2: (!) 89%  (!) 81% 98%  Weight:      Height:        General: Pt is alert, awake, extremely frail, chronically ill-appearing, in no apparent distress Cardiovascular: RRR, S1/S2 +, no rubs, no gallops Respiratory: CTA bilaterally, no wheezing, no rhonchi Abdominal: Soft, NT, ND, bowel sounds + Extremities: no edema, no cyanosis    The results of significant diagnostics from this hospitalization (including imaging, microbiology, ancillary and laboratory) are listed below for reference.     Microbiology: Recent Results (from the past 240 hour(s))  Urine Culture     Status: Abnormal   Collection Time: 11/09/21  7:10 PM   Specimen: Urine, Clean Catch  Result Value Ref Range Status   Specimen Description URINE, CLEAN CATCH  Final   Special Requests   Final    NONE Performed at [facility], [address] [place], [place] [address]    Culture MULTIPLE SPECIES PRESENT, SUGGEST RECOLLECTION (A)  Final   Report Status 11/11/2021 FINAL  Final  Culture, blood (single)     Status: None   Collection Time: 11/10/21 12:13 AM   Specimen: BLOOD  Result Value Ref Range Status   Specimen Description BLOOD RIGHT ANTECUBITAL  Final   Special Requests   Final    BOTTLES DRAWN AEROBIC AND ANAEROBIC Blood Culture results may not be optimal due to an inadequate volume of blood received in culture bottles   Culture   Final    NO GROWTH 5 DAYS Performed at [facility], [place] [address], [place], [place] [address]    Report Status 11/15/2021 FINAL  Final  Culture, blood (routine x 2)     Status: None   Collection Time: 11/10/21  8:30 AM   Specimen: BLOOD  Result Value Ref Range Status   Specimen Description BLOOD LEFT ANTECUBITAL  Final   Special Requests   Final    BOTTLES DRAWN  AEROBIC AND ANAEROBIC Blood  Culture results may not be optimal due to an inadequate volume of blood received in culture bottles   Culture   Final    NO GROWTH 5 DAYS Performed at [facility], [place] [address], [place], [place] [address]    Report Status 11/15/2021 FINAL  Final  Culture, blood (routine x 2)     Status: None   Collection Time: 11/10/21  8:46 AM   Specimen: BLOOD  Result Value Ref Range Status   Specimen Description BLOOD RIGHT ANTECUBITAL  Final   Special Requests   Final    BOTTLES DRAWN AEROBIC AND ANAEROBIC Blood Culture adequate volume   Culture   Final    NO GROWTH 5 DAYS Performed at [facility], [place] [address], [place], [place] [address]    Report Status 11/15/2021 FINAL  Final     Labs: BNP (last 3 results) Recent Labs    11/15/21 0310 11/16/21 0212 11/17/21 0150  BNP 734.0* 556.5* 098.1*   Basic Metabolic Panel: Recent Labs  Lab 11/13/21 0159 11/14/21 0018 11/15/21 0310 11/16/21 0212 11/17/21 0150  NA 138 138 139 139 139  K 3.9 4.1 4.1 3.9 3.9  CL 102 103 104 104 104  CO2 26 25 24 25 26   GLUCOSE 128* 113* 108* 113* 122*  BUN 26* 26* 24* 23 20  CREATININE 2.37* 2.43* 2.53* 2.61* 2.62*  CALCIUM 8.3* 8.3* 8.3* 8.3* 8.3*  MG 2.0 1.9 1.9 1.9 2.1   Liver Function Tests: Recent Labs  Lab 11/13/21 0159 11/14/21 0018 11/15/21 0310 11/16/21 0212 11/17/21 0150  AST 18 15 19 19 15   ALT 12 13 12 14 13   ALKPHOS 57 61 53 60 61  BILITOT 0.3 0.3 0.7 0.6 0.6  PROT 5.2* 5.0* 5.0* 5.1* 5.1*  ALBUMIN 2.4* 2.4* 2.1* 2.4* 2.4*   No results for input(s): LIPASE, AMYLASE in the last 168 hours. No results for input(s): AMMONIA in the last 168 hours. CBC: Recent Labs  Lab 11/13/21 0159 11/14/21 0018 11/15/21 0310 11/16/21 0212 11/17/21 0150  WBC 11.4* 8.6 9.5 8.8 9.7  NEUTROABS 7.8* 5.8 6.3 5.8 6.2  HGB 10.3* 10.3* 9.9* 9.8* 9.4*  HCT 32.0* 31.5* 30.5* 31.3* 29.7*  MCV 99.7 99.4 97.8 100.0 100.0  PLT 233 216 227 234 262   Cardiac Enzymes: No results for  input(s): CKTOTAL, CKMB, CKMBINDEX, TROPONINI in the last 168 hours. BNP: Invalid input(s): POCBNP CBG: No results for input(s): GLUCAP in the last 168 hours. D-Dimer No results for input(s): DDIMER in the last 72 hours. Hgb A1c No results for input(s): HGBA1C in the last 72 hours. Lipid Profile No results for input(s): CHOL, HDL, LDLCALC, TRIG, CHOLHDL, LDLDIRECT in the last 72 hours. Thyroid function studies No results for input(s): TSH, T4TOTAL, T3FREE, THYROIDAB in the last 72 hours.  Invalid input(s): FREET3 Anemia work up No results for input(s): VITAMINB12, FOLATE, FERRITIN, TIBC, IRON, RETICCTPCT in the last 72 hours. Urinalysis    Component Value Date/Time   COLORURINE AMBER (A) 11/09/2021 2151   APPEARANCEUR TURBID (A) 11/09/2021 2151   LABSPEC 1.013 11/09/2021 2151   PHURINE 6.0 11/09/2021 2151   GLUCOSEU NEGATIVE 11/09/2021 2151   HGBUR LARGE (A) 11/09/2021 2151   BILIRUBINUR NEGATIVE 11/09/2021 2151   [place] 11/09/2021 2151   PROTEINUR 100 (A) 11/09/2021 2151   UROBILINOGEN 0.2 08/27/2015 1930   NITRITE NEGATIVE 11/09/2021 2151   LEUKOCYTESUR LARGE (A) 11/09/2021 2151   Sepsis Labs Invalid input(s): PROCALCITONIN,  WBC,  LACTICIDVEN Microbiology Recent Results (from the past 240 hour(s))  Urine Culture     Status: Abnormal   Collection Time: 11/09/21  7:10 PM   Specimen: Urine, Clean Catch  Result Value Ref Range Status   Specimen Description URINE, CLEAN CATCH  Final   Special Requests   Final    NONE Performed at [facility], [address] [place], [place] [address]    Culture MULTIPLE SPECIES PRESENT, SUGGEST RECOLLECTION (A)  Final   Report Status 11/11/2021 FINAL  Final  Culture, blood (single)     Status: None   Collection Time: 11/10/21 12:13 AM   Specimen: BLOOD  Result Value Ref Range Status   Specimen Description BLOOD RIGHT ANTECUBITAL  Final   Special Requests   Final    BOTTLES DRAWN AEROBIC AND ANAEROBIC Blood  Culture results may not be optimal due to an inadequate volume of blood received in culture bottles   Culture   Final    NO GROWTH 5 DAYS Performed at [facility], [place] [address], [place], [place] [address]    Report Status 11/15/2021 FINAL  Final  Culture, blood (routine x 2)     Status: None   Collection Time: 11/10/21  8:30 AM   Specimen: BLOOD  Result Value Ref Range Status   Specimen Description BLOOD LEFT ANTECUBITAL  Final   Special Requests   Final    BOTTLES DRAWN AEROBIC AND ANAEROBIC Blood Culture results may not be optimal due to an inadequate volume of blood received in culture bottles   Culture   Final    NO GROWTH 5 DAYS Performed at [facility], [place] [address], [place], [place] [address]    Report Status 11/15/2021 FINAL  Final  Culture, blood (routine x 2)     Status: None   Collection Time: 11/10/21  8:46 AM   Specimen: BLOOD  Result Value Ref Range Status   Specimen Description BLOOD RIGHT ANTECUBITAL  Final   Special Requests   Final    BOTTLES DRAWN AEROBIC AND ANAEROBIC Blood Culture adequate volume   Culture   Final    NO GROWTH 5 DAYS Performed at [facility], [place] [address] [place], [place] [address]    Report Status 11/15/2021 FINAL  Final     Time coordinating discharge: Over 30 minutes  SIGNED:   Phillips Climes, MD  [facility] 11/17/2021, 9:53 AM Pager   If 7PM-7AM, please contact night-coverage [email or web address] Password [email or web address]

## 2021-11-17 NOTE — Discharge Instructions (Addendum)
Follow with Primary MD [name], MD in 7 days   Get CBC, CMP,  checked  by Primary MD next visit.    Activity: As tolerated with Full fall precautions use walker/cane & assistance as needed   Disposition Home    Diet: Regular diet , with feeding assistance and aspiration precautions.   On your next visit with your primary care physician please Get Medicines reviewed and adjusted.   Please request your Prim.MD to go over all Hospital Tests and Procedure/Radiological results at the follow up, please get all Hospital records sent to your Prim MD by signing hospital release before you go home.   If you experience worsening of your admission symptoms, develop shortness of breath, life threatening emergency, suicidal or homicidal thoughts you must seek medical attention immediately by calling 911 or calling your MD immediately  if symptoms less severe.  You Must read complete instructions/literature along with all the possible adverse reactions/side effects for all the Medicines you take and that have been prescribed to you. Take any new Medicines after you have completely understood and accpet all the possible adverse reactions/side effects.   Do not drive, operating heavy machinery, perform activities at heights, swimming or participation in water activities or provide baby sitting services if your were admitted for syncope or siezures until you have seen by Primary MD or a Neurologist and advised to do so again.  Do not drive when taking Pain medications.    Do not take more than prescribed Pain, Sleep and Anxiety Medications  Special Instructions: If you have smoked or chewed Tobacco  in the last 2 yrs please stop smoking, stop any regular Alcohol  and or any Recreational drug use.  Wear Seat belts while driving.   Please note  You were cared for by a hospitalist during your hospital stay. If you have any questions about your discharge medications or the care you  received while you were in the hospital after you are discharged, you can call the unit and asked to speak with the hospitalist on call if the hospitalist that took care of you is not available. Once you are discharged, your primary care physician will handle any further medical issues. Please note that NO REFILLS for any discharge medications will be authorized once you are discharged, as it is imperative that you return to your primary care physician (or establish a relationship with a primary care physician if you do not have one) for your aftercare needs so that they can reassess your need for medications and monitor your lab values.   Information on my medicine - ELIQUIS (apixaban)  This medication education was reviewed with me or my healthcare representative as part of my discharge preparation.  The pharmacist that spoke with me during my hospital stay was:  Ursula Beath, [facility]  Why was Eliquis prescribed for you? Eliquis was prescribed for you to reduce the risk of forming blood clots that can cause a stroke if you have a medical condition called atrial fibrillation (a type of irregular heartbeat) OR to reduce the risk of a blood clots forming after orthopedic surgery.  What do You need to know about Eliquis ? Take your Eliquis 2.5 mg TWICE DAILY - one tablet in the morning and one tablet in the evening with or without food.  It would be best to take the doses about the same time each day.  If you have difficulty swallowing the tablet whole please discuss with your pharmacist how  to take the medication safely.  Take Eliquis exactly as prescribed by your doctor and DO NOT stop taking Eliquis without talking to the doctor who prescribed the medication.  Stopping may increase your risk of developing a new clot or stroke.  Refill your prescription before you run out.  After discharge, you should have regular check-up appointments with your healthcare provider that is prescribing your  Eliquis.  In the future your dose may need to be changed if your kidney function or weight changes by a significant amount or as you get older.  What do you do if you miss a dose? If you miss a dose, take it as soon as you remember on the same day and resume taking twice daily.  Do not take more than one dose of ELIQUIS at the same time.  Important Safety Information A possible side effect of Eliquis is bleeding. You should call your healthcare provider right away if you experience any of the following: Bleeding from an injury or your nose that does not stop. Unusual colored urine (red or dark brown) or unusual colored stools (red or black). Unusual bruising for unknown reasons. A serious fall or if you hit your head (even if there is no bleeding).  Some medicines may interact with Eliquis and might increase your risk of bleeding or clotting while on Eliquis. To help avoid this, consult your healthcare provider or pharmacist prior to using any new prescription or non-prescription medications, including herbals, vitamins, non-steroidal anti-inflammatory drugs (NSAIDs) like ibuprofen or naproxen, and supplements.  This website has more information on Eliquis (apixaban): [email or web address].

## 2021-11-17 NOTE — TOC Transition Note (Signed)
Transition of Care [facility]) - CM/SW Discharge Note   Patient Details  Name: Robert Rowe MRN: [id number] Date of Birth: Jan 07, 1918  [facility] CM/SW Contact:  Cyndi Bender, RN Phone Number: 11/17/2021, 11:08 AM   Clinical Narrative:    Patient is stable for discharge. Home health and outpt palliative ordered. Patient deferred to [facility] to find agencies.  Spoke to [place] with [facility] and referral accepted. Spoke to [place] with [facility] and referral accepted. [facility] called for transportation home. SCAT application faxed for assistance with getting to apts Address, Phone number and PCP verified.    Final next level of care: [place] Barriers to Discharge: Barriers Resolved   Patient Goals and CMS Choice Patient states their goals for this hospitalization and ongoing recovery are:: to return home CMS Medicare.gov Compare Post Acute Care list provided to:: Patient Choice offered to / list presented to : Patient  Discharge Placement               home        Discharge Plan and Services   Discharge Planning Services: CM Consult Post Acute Care Choice: Home Health                    HH Arranged: PT, OT [facility] Agency: [place] Date [place]: 11/17/21 Time [place]: 1108 Representative spoke with at [place]: [place] ([place]) Interventions     Readmission Risk Interventions No flowsheet data found.

## 2021-11-17 NOTE — Progress Notes (Signed)
[id number]   [facility]) Hospital Liaison Note  Notified by [facility] manager of patient/family request for [facility] palliative services at home after discharge.   [facility] hospital liaison will follow patient for discharge disposition.   Please call with any hospice or outpatient palliative care related questions.   Thank you for the opportunity to participate in this patient's care.   [facility] Liaison [phone]

## 2021-11-18 ENCOUNTER — Telehealth: Payer: Self-pay

## 2021-11-18 NOTE — Telephone Encounter (Signed)
Transition Care Management Unsuccessful Follow-up Telephone Call  Date of discharge and from where:  [place] 11/17/21 Dx: pyelonephritis  Attempts:  1st Attempt  Reason for unsuccessful TCM follow-up call:  Left voice message  Transition Care Management Follow-up Telephone Call Date of discharge and from where: [place] 11/17/21 Dx: pyelonephritis How have you been since you were released from the hospital? Better but tired  Any questions or concerns? No  Items Reviewed: Did the pt receive and understand the discharge instructions provided? Yes  Medications obtained and verified? Yes  Other? No  Any new allergies since your discharge? No  Dietary orders reviewed? Yes Do you have support at home? Yes   Home Care and Equipment/Supplies: Were home health services ordered? No   Has the agency set up a time to come to the patient's home? not applicable Were any new equipment or medical supplies ordered?  No Were you able to get the supplies/equipment? not applicable Do you have any questions related to the use of the equipment or supplies? No  Functional Questionnaire: (I = Independent and D = Dependent) ADLs: D  Bathing/Dressing- D  Meal Prep- D  Eating- I  Maintaining continence- D  Transferring/Ambulation- D  Managing Meds- D  Follow up appointments reviewed:  PCP Hospital f/u appt confirmed? no Scheduled to see Dr Elease Hashimoto  on 11-23-21 @ 230pm. [facility] Hospital f/u appt confirmed? Yes  Scheduled to see Dr [name] on 12-09-21 @ 1130am. Are transportation arrangements needed?  Wife states appt needs to be done televisit  If their condition worsens, is the pt aware to call PCP or go to the Emergency Dept.? Yes Was the patient provided with contact information for the PCP's office or ED? Yes Was to pt encouraged to call back with questions or concerns? Yes

## 2021-11-18 NOTE — Progress Notes (Signed)
Received a phone call from [facility] with [facility] and he stated that they are unable to accept referral. Spoke to [place] with [facility] and referral accepted for HH-PT. It is ok for start of care to be next week 11/23/20. [facility] doesn't have OT but PT will address OT needs. No order for HH-RN.

## 2021-11-18 NOTE — Telephone Encounter (Signed)
Attempted to contact patient's spouse Annmarie to schedule a Palliative Care consult appointment. No answer left a message to return call.

## 2021-11-23 ENCOUNTER — Other Ambulatory Visit: Payer: Self-pay

## 2021-11-23 ENCOUNTER — Telehealth (INDEPENDENT_AMBULATORY_CARE_PROVIDER_SITE_OTHER): Payer: Medicare Other | Admitting: Family Medicine

## 2021-11-23 ENCOUNTER — Telehealth: Payer: Self-pay

## 2021-11-23 DIAGNOSIS — G8191 Hemiplegia, unspecified affecting right dominant side: Secondary | ICD-10-CM | POA: Diagnosis not present

## 2021-11-23 DIAGNOSIS — A419 Sepsis, unspecified organism: Secondary | ICD-10-CM | POA: Diagnosis not present

## 2021-11-23 DIAGNOSIS — N189 Chronic kidney disease, unspecified: Secondary | ICD-10-CM

## 2021-11-23 DIAGNOSIS — I480 Paroxysmal atrial fibrillation: Secondary | ICD-10-CM | POA: Insufficient documentation

## 2021-11-23 DIAGNOSIS — I509 Heart failure, unspecified: Secondary | ICD-10-CM | POA: Diagnosis not present

## 2021-11-23 DIAGNOSIS — Z7409 Other reduced mobility: Secondary | ICD-10-CM | POA: Diagnosis not present

## 2021-11-23 DIAGNOSIS — L97419 Non-pressure chronic ulcer of right heel and midfoot with unspecified severity: Secondary | ICD-10-CM | POA: Diagnosis not present

## 2021-11-23 DIAGNOSIS — M6281 Muscle weakness (generalized): Secondary | ICD-10-CM | POA: Diagnosis not present

## 2021-11-23 DIAGNOSIS — N184 Chronic kidney disease, stage 4 (severe): Secondary | ICD-10-CM | POA: Diagnosis not present

## 2021-11-23 DIAGNOSIS — E785 Hyperlipidemia, unspecified: Secondary | ICD-10-CM | POA: Diagnosis not present

## 2021-11-23 DIAGNOSIS — N179 Acute kidney failure, unspecified: Secondary | ICD-10-CM | POA: Diagnosis not present

## 2021-11-23 NOTE — Progress Notes (Signed)
Patient ID: Robert Rowe, male   DOB: December 30, 1917, 88 y.o.   MRN: [id number]  This visit type was conducted due to national recommendations for restrictions regarding the COVID-19 pandemic in an effort to limit this patient's exposure and mitigate transmission in our community.   Virtual Visit via Video Note  I connected with Robert Rowe on 11/23/21 at  2:30 PM EST by a video enabled telemedicine application and verified that I am speaking with the correct person using two identifiers.  Location patient: home Location provider:work or home office Persons participating in the virtual visit: patient, provider, patient's wife, and patient's son-[name].  His son help facilitate this virtual visit.  I discussed the limitations of evaluation and management by telemedicine and the availability of in person appointments. The patient expressed understanding and agreed to proceed.   HPI:  Recent hospitalization 12-20 through 11-17-2021 for left-sided pyelonephritis possibly related to recent left ureteral stone and sepsis.  His blood cultures remain negative.  He was treated with IV Rocephin.  Urine culture did grow out multiple species.  Finished treatment with Keflex.  During hospitalization had episode of paroxysmal A. fib with rapid ventricular response with NSTEMI.  He was started on low-dose Eliquis and amiodarone.  Rate has been stable since then and blood pressures been stable.  He had some low blood pressures at one point and was started on midodrine twice daily.  His recent blood pressures have been ranging around 355-732 systolic and 70 diastolic.  He did have recent bump in creatinine up to 2.62 at discharge.  Family states he is doing reasonably well.  He seems to be alert and fair appetite.  He does have small superficial ulcer right heel which apparently developed in the hospital and also blister on the right great toe.  No surrounding erythema.  No fevers or chills.  We recently received a  call and family has considered palliative care.  They are trying to balance pros and cons of palliative care at this time versus home health care.  He is currently getting some home physical therapy but family mostly concerned about nursing follow-up for his right foot lesions.  He does have scheduled follow-up with cardiology in a couple of weeks.  He has not been ambulatory for some time.  Very supportive family.   ROS: See pertinent positives and negatives per HPI.  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 Echo 12/16: mild LVH, EF 55-60%, no RWMA, Gr 1 DD, mild AI, MAC, mild LAE, normal RVF, PASP 37 mmHg   Chronic kidney disease    chronic    DJD (degenerative joint disease)    Dysrhythmia    pvc's    GERD 12/03/2007   Hiatal hernia    History of nuclear stress test    Myoview 1/17: EF 53%, normal perfusion; Low Risk   HYPERLIPIDEMIA 04/05/2007   Hypothyroidism    Osteoarth NOS-Unspec 04/05/2007   Palpitations 02/12/2010   PEDAL EDEMA 05/01/2008   Rheumatoid arthritis(714.0) 04/05/2007   Shortness of breath dyspnea    TRANSIENT ISCHEMIC ATTACK, HX OF 04/05/2007   ?     Past Surgical History:  Procedure Laterality Date   BALLOON DILATION N/A 03/12/2013   Procedure: BALLOON DILATION;  Surgeon: Inda Castle, MD;  Location: [facility];  Service: Endoscopy;  Laterality: N/A;   CARPAL TUNNEL RELEASE  11/28/2018   Left hand   CATARACT EXTRACTION  2001   bilateral   CYSTOSCOPY WITH URETHRAL DILATATION N/A 09/24/2015   Procedure: CYSTOSCOPY WITH COOK BALLOON DILATATION OF URETHRAL STRICTURE ;  Surgeon: Carolan Clines, MD;  Location: [facility];  Service: Urology;  Laterality: N/A;   CYSTOSCOPY WITH URETHRAL DILATATION N/A 07/20/2016   Procedure: CYSTOSCOPY WITH  URETHRAL DILATATION;  Surgeon: Alexis Frock, MD;  Location: [facility];  Service: Urology;  Laterality: N/A;  1 HOUR   ESOPHAGOGASTRODUODENOSCOPY N/A 03/12/2013   Procedure: ESOPHAGOGASTRODUODENOSCOPY (EGD);  Surgeon: Inda Castle, MD;  Location: [facility];  Service: Endoscopy;  Laterality: N/A;   HEMORRHOID SURGERY     HERNIA REPAIR     inguinal x5   KNEE SURGERY  arthroscopic   left   PROSTATE CRYOABLATION     2000   SHOULDER SURGERY  few years ago   left   TRANSURETHRAL RESECTION OF PROSTATE N/A 03/19/2015   Procedure: TRANSURETHRAL RESECTION OF THE PROSTATE (TURP);  Surgeon: Carolan Clines, MD;  Location: [facility];  Service: Urology;  Laterality: N/A;    Family History  Problem Relation Age of Onset   Breast cancer Mother    Heart disease Father    Heart attack Father    Lymphoma Brother    Stroke Neg Hx    Hypertension Neg Hx     SOCIAL HX: Quit smoking 1954   Current Outpatient Medications:    acetaminophen (TYLENOL) 325 MG tablet, Take 650 mg by mouth at bedtime., Disp: , Rfl:    albuterol (VENTOLIN HFA) 108 (90 Base) MCG/ACT inhaler, INHALE 2 PUFFS INTO THE LUNGS EVERY 6 HOURS AS NEEDED FOR WHEEZING (COUGH)., Disp: 8.5 each, Rfl: 2   amiodarone (PACERONE) 200 MG tablet, Take 1 tablet (200 mg) by mouth twice daily for 5 days, then decrease to 1 tablet (200 mg) once daily daily after that., Disp: 35 tablet, Rfl: 0   apixaban (ELIQUIS) 2.5 MG TABS tablet, Take 1 tablet (2.5 mg total) by mouth 2 (two) times daily., Disp: 60 tablet, Rfl: 0   atorvastatin (LIPITOR) 40 MG tablet, TAKE 1 TABLET (40 MG TOTAL) BY MOUTH DAILY AT 6 PM. (Patient taking differently: Take 40 mg by mouth daily.), Disp: 90 tablet, Rfl: 3   ketoconazole (NIZORAL) 2 % cream, APPLY 1 APPLICATION TOPICALLY 2 (TWO) TIMES DAILY AS NEEDED FOR IRRITATION., Disp: 30 g, Rfl: 0   latanoprost (XALATAN) 0.005 % ophthalmic solution, Place 1 drop into both eyes at bedtime. For Glaucoma, Disp: , Rfl: 99    levothyroxine (SYNTHROID) 50 MCG tablet, TAKE 1 TABLET BY MOUTH  DAILY, Disp: 90 tablet, Rfl: 3   magnesium hydroxide (MILK OF MAGNESIA) 400 MG/5ML suspension, Take 30 mLs by mouth See admin instructions. Constipation (1 of 4 ) If no BM in 3 days, give 30 cc Milk of Magnesium p.o. x 1 dose in 24 hours as needed ( Do  Not use standing constipation orders for residents with renal failure CFR less than 30. Contact MD for orders), Disp: , Rfl:    meclizine (ANTIVERT) 12.5 MG tablet, Take 1 tablet (12.5 mg total) by mouth 3 (three) times daily as needed for dizziness., Disp: 30 tablet, Rfl: 0   midodrine (PROAMATINE) 10 MG tablet, Take 1 tablet (10 mg total) by mouth 2 (two) times daily with a meal., Disp: 60 tablet, Rfl: 0   nitroGLYCERIN (NITROSTAT) 0.4 MG SL tablet,  Place 1 tablet (0.4 mg total) under the tongue every 5 (five) minutes as needed (for Esophageal spasms)., Disp: 30 tablet, Rfl: 1   pantoprazole (PROTONIX) 40 MG tablet, TAKE 1 TABLET BY MOUTH EVERY DAY, Disp: 90 tablet, Rfl: 3   senna-docusate (SENOKOT-S) 8.6-50 MG tablet, Take 1 tablet by mouth at bedtime as needed for moderate constipation., Disp:  , Rfl:    triamcinolone (KENALOG) 0.1 %, APPLY TO AFFECTED RASH TWICE DAILY AS NEEDED. (Patient taking differently: Apply 1 application topically 2 (two) times daily as needed (rash).), Disp: 30 g, Rfl: 2  EXAM:  VITALS per patient if applicable:  GENERAL: alert, oriented, appears well and in no acute distress  HEENT: atraumatic, conjunttiva clear, no obvious abnormalities on inspection of external nose and ears  NECK: normal movements of the head and neck  LUNGS: on inspection no signs of respiratory distress, breathing rate appears normal, no obvious gross SOB, gasping or wheezing  CV: no obvious cyanosis  MS: moves all visible extremities without noticeable abnormality  PSYCH/NEURO: pleasant and cooperative, no obvious depression or anxiety, speech and thought processing grossly  intact  ASSESSMENT AND PLAN:  Discussed the following assessment and plan:  #1 recent hospitalization for sepsis and left pyelonephritis.  Clinically improved.  No further fevers.  Is off antibiotics at this point.  Per family, seems to be back close to baseline at this time  #2 paroxysmal atrial fibrillation with rapid ventricular response and recent NSTEMI.  Patient now on amiodarone and Eliquis.  He has scheduled follow-up with cardiology.  #3 acute on chronic renal failure.  Recent bump in creatinine likely related to his sepsis event.  Sounds like he is staying fairly well-hydrated.  Needs follow-up basic metabolic panel at some point.  They plan to discuss to see if this could be done when he is seen by cardiology  #4 superficial ulcer right heel and blister right great toe.  Family in process of trying to get home health care nursing out.  They have done a good job since he got back home of keeping pressure off the heel.  Follow closely for any signs of secondary infection     I discussed the assessment and treatment plan with the patient. The patient was provided an opportunity to ask questions and all were answered. The patient agreed with the plan and demonstrated an understanding of the instructions.   The patient was advised to call back or seek an in-person evaluation if the symptoms worsen or if the condition fails to improve as anticipated.     Carolann Littler, MD

## 2021-11-23 NOTE — Telephone Encounter (Signed)
June the home health physical therapist with [facility] called in and would like orders to work with patient 2 times a week for 5 weeks. For strength training, transfer training, and education on home exercise. The physical therapist also stated the patient would need home health nursing for wound care June can be reached at [phone].

## 2021-11-23 NOTE — Telephone Encounter (Signed)
Orders have been given.  

## 2021-11-25 ENCOUNTER — Other Ambulatory Visit (HOSPITAL_COMMUNITY): Payer: Self-pay

## 2021-11-25 DIAGNOSIS — E785 Hyperlipidemia, unspecified: Secondary | ICD-10-CM | POA: Diagnosis not present

## 2021-11-25 DIAGNOSIS — G8191 Hemiplegia, unspecified affecting right dominant side: Secondary | ICD-10-CM | POA: Diagnosis not present

## 2021-11-25 DIAGNOSIS — I509 Heart failure, unspecified: Secondary | ICD-10-CM | POA: Diagnosis not present

## 2021-11-25 DIAGNOSIS — Z7409 Other reduced mobility: Secondary | ICD-10-CM | POA: Diagnosis not present

## 2021-11-25 DIAGNOSIS — M6281 Muscle weakness (generalized): Secondary | ICD-10-CM | POA: Diagnosis not present

## 2021-11-25 DIAGNOSIS — N184 Chronic kidney disease, stage 4 (severe): Secondary | ICD-10-CM | POA: Diagnosis not present

## 2021-11-26 ENCOUNTER — Telehealth: Payer: Self-pay

## 2021-11-26 NOTE — Telephone Encounter (Signed)
Attempted to contact patient's son Robert Rowe to schedule a Palliative Care consult appointment. No answer left a message to return call.

## 2021-11-29 ENCOUNTER — Other Ambulatory Visit (HOSPITAL_COMMUNITY): Payer: Self-pay

## 2021-11-29 ENCOUNTER — Telehealth (HOSPITAL_COMMUNITY): Payer: Self-pay | Admitting: Pharmacist

## 2021-11-29 DIAGNOSIS — I509 Heart failure, unspecified: Secondary | ICD-10-CM | POA: Diagnosis not present

## 2021-11-29 DIAGNOSIS — E785 Hyperlipidemia, unspecified: Secondary | ICD-10-CM | POA: Diagnosis not present

## 2021-11-29 DIAGNOSIS — N184 Chronic kidney disease, stage 4 (severe): Secondary | ICD-10-CM | POA: Diagnosis not present

## 2021-11-29 DIAGNOSIS — G8191 Hemiplegia, unspecified affecting right dominant side: Secondary | ICD-10-CM | POA: Diagnosis not present

## 2021-11-29 DIAGNOSIS — M6281 Muscle weakness (generalized): Secondary | ICD-10-CM | POA: Diagnosis not present

## 2021-11-29 DIAGNOSIS — Z7409 Other reduced mobility: Secondary | ICD-10-CM | POA: Diagnosis not present

## 2021-11-29 NOTE — Telephone Encounter (Signed)
Pharmacy Transitions of Care Follow-up Telephone Call  Date of discharge: 11/17/21  Discharge Diagnosis: a.fib  Spoke with Mrs. Zobrist, pts wife and caregiver  Medication changes made at discharge:  - START:  amiodarone (PACERONE)  Eliquis (apixaban)  midodrine (PROAMATINE)   - STOPPED:  clopidogrel 75 MG tablet (PLAVIX)  furosemide 20 MG tablet (Lasix)  metoprolol succinate 25 MG 24 hr tablet (TOPROL-XL)   - CHANGED: n/a  Medication changes verified by the patient? yes    Medication Accessibility:  [facility]   Was the patient provided with refills on discharged medications? no   Have all prescriptions been transferred from [facility] to home pharmacy? N/a   Is the patient able to afford medications? Part D Notable copays: eliquis Eligible patient assistance:     Medication Review:   APIXABAN (ELIQUIS)  Apixaban 2.5 mg BID for a.fib - Discussed importance of taking medication around the same time everyday  - Reviewed potential DDIs with patient  - Advised patient of medications to avoid (NSAIDs, ASA)  - Educated that Tylenol (acetaminophen) will be the preferred analgesic to prevent risk of bleeding  - Emphasized importance of monitoring for signs and symptoms of bleeding (abnormal bruising, prolonged bleeding, nose bleeds, bleeding from gums, discolored urine, black tarry stools)  - Advised patient to alert all providers of anticoagulation therapy prior to starting a new medication or having a procedure    Follow-up Appointments:  PCP Hospital f/u appt confirmed? Saw Dr. Elease Hashimoto 11/23/21   [facility] f/u appt confirmed?  Scheduled to see Dr. [name], cardio on 12/09/21   If their condition worsens, is the pt aware to call PCP or go to the Emergency Dept.? yes  Final Patient Assessment: Pts wife is managing meds well and reports that Mr. Aument is doing well on these medications.  They have follow-up scheduled and will ask for  refills at this time if applicable.

## 2021-12-01 DIAGNOSIS — N184 Chronic kidney disease, stage 4 (severe): Secondary | ICD-10-CM | POA: Diagnosis not present

## 2021-12-01 DIAGNOSIS — Z7409 Other reduced mobility: Secondary | ICD-10-CM | POA: Diagnosis not present

## 2021-12-01 DIAGNOSIS — M6281 Muscle weakness (generalized): Secondary | ICD-10-CM | POA: Diagnosis not present

## 2021-12-01 DIAGNOSIS — G8191 Hemiplegia, unspecified affecting right dominant side: Secondary | ICD-10-CM | POA: Diagnosis not present

## 2021-12-01 DIAGNOSIS — I509 Heart failure, unspecified: Secondary | ICD-10-CM | POA: Diagnosis not present

## 2021-12-01 DIAGNOSIS — E785 Hyperlipidemia, unspecified: Secondary | ICD-10-CM | POA: Diagnosis not present

## 2021-12-02 DIAGNOSIS — Z7409 Other reduced mobility: Secondary | ICD-10-CM | POA: Diagnosis not present

## 2021-12-02 DIAGNOSIS — I509 Heart failure, unspecified: Secondary | ICD-10-CM | POA: Diagnosis not present

## 2021-12-02 DIAGNOSIS — M6281 Muscle weakness (generalized): Secondary | ICD-10-CM | POA: Diagnosis not present

## 2021-12-02 DIAGNOSIS — G8191 Hemiplegia, unspecified affecting right dominant side: Secondary | ICD-10-CM | POA: Diagnosis not present

## 2021-12-02 DIAGNOSIS — N184 Chronic kidney disease, stage 4 (severe): Secondary | ICD-10-CM | POA: Diagnosis not present

## 2021-12-02 DIAGNOSIS — E785 Hyperlipidemia, unspecified: Secondary | ICD-10-CM | POA: Diagnosis not present

## 2021-12-03 ENCOUNTER — Other Ambulatory Visit (HOSPITAL_COMMUNITY): Payer: Self-pay

## 2021-12-06 DIAGNOSIS — N184 Chronic kidney disease, stage 4 (severe): Secondary | ICD-10-CM | POA: Diagnosis not present

## 2021-12-06 DIAGNOSIS — Z7409 Other reduced mobility: Secondary | ICD-10-CM | POA: Diagnosis not present

## 2021-12-06 DIAGNOSIS — E785 Hyperlipidemia, unspecified: Secondary | ICD-10-CM | POA: Diagnosis not present

## 2021-12-06 DIAGNOSIS — G8191 Hemiplegia, unspecified affecting right dominant side: Secondary | ICD-10-CM | POA: Diagnosis not present

## 2021-12-06 DIAGNOSIS — M6281 Muscle weakness (generalized): Secondary | ICD-10-CM | POA: Diagnosis not present

## 2021-12-06 DIAGNOSIS — I509 Heart failure, unspecified: Secondary | ICD-10-CM | POA: Diagnosis not present

## 2021-12-07 ENCOUNTER — Telehealth: Payer: Self-pay

## 2021-12-07 NOTE — Telephone Encounter (Signed)
Spoke with patient's wife Annmarie regarding Palliative Care services. Wife states that patient has a caregiver 100 hours per week, and receiving PT in the home. She states that she doesn't think patient needs the services at this time. She will reach out to Dr. Elease Hashimoto in the future if service is needed. Will cancel referral.

## 2021-12-08 DIAGNOSIS — M6281 Muscle weakness (generalized): Secondary | ICD-10-CM | POA: Diagnosis not present

## 2021-12-08 DIAGNOSIS — Z7409 Other reduced mobility: Secondary | ICD-10-CM | POA: Diagnosis not present

## 2021-12-08 DIAGNOSIS — G8191 Hemiplegia, unspecified affecting right dominant side: Secondary | ICD-10-CM | POA: Diagnosis not present

## 2021-12-08 DIAGNOSIS — N184 Chronic kidney disease, stage 4 (severe): Secondary | ICD-10-CM | POA: Diagnosis not present

## 2021-12-08 DIAGNOSIS — E785 Hyperlipidemia, unspecified: Secondary | ICD-10-CM | POA: Diagnosis not present

## 2021-12-08 DIAGNOSIS — I509 Heart failure, unspecified: Secondary | ICD-10-CM | POA: Diagnosis not present

## 2021-12-09 ENCOUNTER — Ambulatory Visit: Payer: Medicare Other | Admitting: Internal Medicine

## 2021-12-09 ENCOUNTER — Encounter: Payer: Self-pay | Admitting: Internal Medicine

## 2021-12-09 ENCOUNTER — Other Ambulatory Visit: Payer: Self-pay

## 2021-12-09 VITALS — BP 129/80 | HR 60

## 2021-12-09 DIAGNOSIS — I480 Paroxysmal atrial fibrillation: Secondary | ICD-10-CM | POA: Diagnosis not present

## 2021-12-09 DIAGNOSIS — Z5181 Encounter for therapeutic drug level monitoring: Secondary | ICD-10-CM | POA: Diagnosis not present

## 2021-12-09 DIAGNOSIS — Z66 Do not resuscitate: Secondary | ICD-10-CM

## 2021-12-09 DIAGNOSIS — Z79899 Other long term (current) drug therapy: Secondary | ICD-10-CM

## 2021-12-09 DIAGNOSIS — N184 Chronic kidney disease, stage 4 (severe): Secondary | ICD-10-CM | POA: Diagnosis not present

## 2021-12-09 DIAGNOSIS — I5043 Acute on chronic combined systolic (congestive) and diastolic (congestive) heart failure: Secondary | ICD-10-CM | POA: Diagnosis not present

## 2021-12-09 MED ORDER — APIXABAN 2.5 MG PO TABS: 2.5000 mg | ORAL_TABLET | Freq: Two times a day (BID) | ORAL | 3 refills | Status: AC

## 2021-12-09 MED ORDER — AMIODARONE HCL 200 MG PO TABS: ORAL_TABLET | ORAL | 3 refills | Status: AC

## 2021-12-09 NOTE — Progress Notes (Signed)
Cardiology Office Note   Date:  12/09/2021   ID:  Robert Rowe, DOB Feb 25, 1918, MRN [id number]  PCP:  Robert Post, MD   CC: Follow-up recent hospitalization   History of Present Illness: Robert Rowe is a 88 y.o. male who presents for scheduled follow-up office visit.  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 He has had numerous endoscopies and esophageal dilatations in the past. He does have a history of chest pain often triggered by alcohol ingestion or overeating. He had chest pain at [facility] in 2014. Stress test there was negative. Last seen by Dr. Mare Rowe 8/16.  Admitted 11/15/15 with chest pain. He had minimal relief with nitroglycerin at home. Cardiac enzymes remained negative. Telemetry was unremarkable. PVCs were noted.   He was seen 12/16 for volume excess.  He was placed on Lasix.  Echo demonstrated normal LVF, mild diastolic dysfunction, mild to mod pulmonary HTN.    He has been worried about his renal function.  He has tried to stay off of meloxicam as much as possible and he is also trying to cut back on his Protonix.  He has furosemide 40 mg on hand but does not take it every day.  He skips it if he is traveling.  The patient had blood work on 01/14/16 which showed that his renal function is stable with creatinine 1.6 to and his B natruretic peptide was normal at 36.5.  He has had mild bilateral peripheral edema which we have suspected is secondary to venous insufficiency rather than to ongoing congestive heart failure..  05/04/2016  I the pleasure of meeting Robert Rowe today in the office. He is a former patient of Dr. 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 He was one of the original medical students at [facility]. He has no known cardiac history but does have a history of high cholesterol and  possible diastolic dysfunction. He takes Lasix as needed for shortness of breath and swelling. He tends to use it as needed.  11/01/2016  Robert Rowe returns today for follow-up. He reports that his main concern is dyspnea. He says this is been present for months but recently is he comes somewhat worse. Workup earlier this year indicated stable systolic function with a low BNP of 36.5. He has chronic venous insufficiency which is likely the cause of his lower extremity swelling. He denies any orthopnea or PND. He does demonstrate some pursed lip breathing today in the office. He denies any wheezing, cough or chest pain. Weight is up about 4 pounds since his last office visit that he reports that he thinks she's been "eating too well.  06/13/2017  Robert Rowe returns today for follow-up. He is again reporting problems with shortness of breath. When I last saw him we have recommended an increase in his Lasix however he declined that. We also ordered pulmonary function tests which he did get. This demonstrated a severe diffusion defect. We did contact him with those results of that was possibly provided BMI chart. He says that he never received results, or also says that he felt that the results were normal. This is not the case, in fact we had referred him to pulmonary for evaluation however he refused the referral because he felt that the testing was normal. I explained to him again  today that there was a significant diffusion defect. He reports shortness of breath with exertion. In the office we walked him around to get on the ambulatory oxygen saturation. His oxygen saturation dropped to 86% with ambulation became up to 96% on 2 L oxygen.  12/19/2017  Robert Rowe was seen today in follow-up.  He again has complaints of shortness of breath today.  He has some memory issues, but in general is doing fairly well for his age.  He recently had some problems with dizziness which resolved.  He had meclizine to use if  needed and a CT scan of the brain showed some atrophy and small vessel disease.  His wife accompanied with him today and they have been married for 44 years.  She says he has become increasingly difficult to care for and almost sounded like she thought he should be in a skilled facility however when questioned did not want him to do that.  I also question about whether they would benefit from some home health care and she said "he would never let somebody else in the house".  When asked about his shortness of breath he says is fairly stable and generally tolerable since he "does not do much".  Vitals appear normal today.  Weight is stable.  His appetite is still reasonably good.  06/18/2018  Robert Rowe was seen today in follow-up.  Overall he seems to be doing well, especially given his advanced age.  Is not complaining of shortness of breath is much as he had recently.  He was admitted in June with some right upper quadrant abdominal pain, thought initially to be a cardiac chest pain however felt to be atypical.  He says he has had no further chest pain episodes since then.  He is able to get around and do most normal activities.  He generally eats out a few times a week which is what he enjoys the most.  He occasionally gets some ankle edema which is more likely related to venous insufficiency.    12/09/2021  Robert Rowe is seen today in follow-up.  I last saw him in 2019.  Unfortunately he was recently hospitalized.  He reportedly had flank pain concerning for kidney stone however thought that he had passed it at home.  Unfortunately developed sepsis related to pyelonephritis.  He was treated with IV Rocephin and improved.  He was noted to go into new onset paroxysmal A. fib with RVR with an elevated troponin.  His CHA2DS2-VASc score is greater than 5 and was started on amiodarone.  A repeat echo was performed which showed a decline in LVEF down to 40 to 45%, previously normal.  Plavix was discontinued and he  was started on Eliquis.  Cardiology was not consulted.  He was seen by palliative care and is DNR.  He also developed acute kidney injury on stage IV chronic kidney disease with baseline creatinine around 1.8.  His discharge is actually been improving somewhat.  According to his caregiver and wife he is eating somewhat better.  He has had significant lower extremity muscle wasting secondary to paraplegia which was apparently related to stroke.  Rhythm today sounds regular.  Past Medical History:  Diagnosis Date   Acute cholecystitis 12/2018   BENIGN PROSTATIC HYPERTROPHY 04/05/2007   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, 1957   pupil permanatelydilated   CHEST DISCOMFORT 04/25/2009   Chronic diastolic CHF (congestive heart failure) ([place])    a. Echo 12/16: mild LVH, EF 55-60%, no RWMA,  Gr 1 DD, mild AI, MAC, mild LAE, normal RVF, PASP 37 mmHg   Chronic kidney disease    chronic    DJD (degenerative joint disease)    Dysrhythmia    pvc's    GERD 12/03/2007   Hiatal hernia    History of nuclear stress test    Myoview 1/17: EF 53%, normal perfusion; Low Risk   HYPERLIPIDEMIA 04/05/2007   Hypothyroidism    Osteoarth NOS-Unspec 04/05/2007   Palpitations 02/12/2010   PEDAL EDEMA 05/01/2008   Rheumatoid arthritis(714.0) 04/05/2007   Shortness of breath dyspnea    TRANSIENT ISCHEMIC ATTACK, HX OF 04/05/2007   ?     Past Surgical History:  Procedure Laterality Date   BALLOON DILATION N/A 03/12/2013   Procedure: BALLOON DILATION;  Surgeon: Inda Castle, MD;  Location: [facility];  Service: Endoscopy;  Laterality: N/A;   CARPAL TUNNEL RELEASE  11/28/2018   Left hand   CATARACT EXTRACTION  2001   bilateral   CYSTOSCOPY WITH URETHRAL DILATATION N/A 09/24/2015   Procedure: CYSTOSCOPY WITH COOK BALLOON DILATATION OF URETHRAL STRICTURE ;  Surgeon: Carolan Clines, MD;  Location: [facility];  Service: Urology;  Laterality: N/A;   CYSTOSCOPY WITH URETHRAL DILATATION N/A 07/20/2016   Procedure: CYSTOSCOPY WITH  URETHRAL DILATATION;  Surgeon: Alexis Frock, MD;  Location: [facility];  Service: Urology;  Laterality: N/A;  1 HOUR   ESOPHAGOGASTRODUODENOSCOPY N/A 03/12/2013   Procedure: ESOPHAGOGASTRODUODENOSCOPY (EGD);  Surgeon: Inda Castle, MD;  Location: [facility];  Service: Endoscopy;  Laterality: N/A;   HEMORRHOID SURGERY     HERNIA REPAIR     inguinal x5   KNEE SURGERY  arthroscopic   left   PROSTATE CRYOABLATION     2000   SHOULDER SURGERY  few years ago   left   TRANSURETHRAL RESECTION OF PROSTATE N/A 03/19/2015   Procedure: TRANSURETHRAL RESECTION OF THE PROSTATE (TURP);  Surgeon: Carolan Clines, MD;  Location: [facility];  Service: Urology;  Laterality: N/A;     Current Outpatient Medications  Medication Sig Dispense Refill   acetaminophen (TYLENOL) 325 MG tablet Take 650 mg by mouth at bedtime.     albuterol (VENTOLIN HFA) 108 (90 Base) MCG/ACT inhaler INHALE 2 PUFFS INTO THE LUNGS EVERY 6 HOURS AS NEEDED FOR WHEEZING (COUGH). 8.5 each 2   amiodarone (PACERONE) 200 MG tablet Take 1 tablet (200 mg) by mouth twice daily for 5 days, then decrease to 1 tablet (200 mg) once daily daily after that. 35 tablet 0   apixaban (ELIQUIS) 2.5 MG TABS tablet Take 1 tablet (2.5 mg total) by mouth 2 (two) times daily. 60 tablet 0   atorvastatin (LIPITOR) 40 MG tablet TAKE 1 TABLET (40 MG TOTAL) BY MOUTH DAILY AT 6 PM. (Patient taking differently: Take 40 mg by mouth daily.) 90 tablet 3   ketoconazole (NIZORAL) 2 % cream APPLY 1 APPLICATION TOPICALLY 2 (TWO) TIMES DAILY AS NEEDED FOR IRRITATION. 30 g 0   latanoprost (XALATAN) 0.005 % ophthalmic solution Place 1 drop into both eyes at bedtime. For Glaucoma  99   levothyroxine (SYNTHROID) 50 MCG tablet TAKE 1 TABLET BY MOUTH  DAILY 90 tablet 3   magnesium hydroxide (MILK OF MAGNESIA) 400 MG/5ML suspension Take 30 mLs by mouth See admin instructions. Constipation (1 of 4 ) If no BM in 3 days, give 30 cc Milk of Magnesium p.o. x 1 dose in 24 hours as needed (  Do  Not use standing constipation orders for residents with renal failure  CFR less than 30. Contact MD for orders)     meclizine (ANTIVERT) 12.5 MG tablet Take 1 tablet (12.5 mg total) by mouth 3 (three) times daily as needed for dizziness. 30 tablet 0   midodrine (PROAMATINE) 10 MG tablet Take 1 tablet (10 mg total) by mouth 2 (two) times daily with a meal. 60 tablet 0   nitroGLYCERIN (NITROSTAT) 0.4 MG SL tablet Place 1 tablet (0.4 mg total) under the tongue every 5 (five) minutes as needed (for Esophageal spasms). 30 tablet 1   pantoprazole (PROTONIX) 40 MG tablet TAKE 1 TABLET BY MOUTH EVERY DAY 90 tablet 3   senna-docusate (SENOKOT-S) 8.6-50 MG tablet Take 1 tablet by mouth at bedtime as needed for moderate constipation.     triamcinolone (KENALOG) 0.1 % APPLY TO AFFECTED RASH TWICE DAILY AS NEEDED. (Patient taking differently: Apply 1 application topically 2 (two) times daily as needed (rash).) 30 g 2   No current facility-administered medications for this visit.    Allergies:   Codeine, Sulfasalazine, and Sulfonamide derivatives    Social History:  The patient  reports that he quit smoking about 68 years ago. His smoking use included cigarettes. He has never used smokeless tobacco. He reports current alcohol use. He reports that he does not use drugs.   Family History:  The patient's family history includes Breast cancer in his mother; Heart attack in his father; Heart disease in his father; Lymphoma in his brother.    ROS:   Pertinent items noted in HPI and remainder of comprehensive ROS otherwise negative.  PHYSICAL EXAM: VS:  BP 129/80    Pulse 60    SpO2 97%  , BMI There is no height or weight on file to calculate BMI. General appearance: alert and no distress Neck: no carotid bruit, no JVD and thyroid not enlarged, symmetric, no tenderness/mass/nodules Lungs: diminished breath sounds bilaterally Heart: regular rate and rhythm Abdomen: soft, non-tender; bowel sounds normal; no  masses,  no organomegaly Extremities: edema trace bilateral ankle edema Pulses: 2+ and symmetric Skin: Skin color, texture, turgor normal. No rashes or lesions Neurologic: Grossly normal Psych: Pleasant  EKG:   Deferred  Recent Labs: 11/11/2021: TSH 3.305 11/17/2021: ALT 13; B Natriuretic Peptide 697.2; BUN 20; Creatinine, Ser 2.62; Hemoglobin 9.4; Magnesium 2.1; Platelets 262; Potassium 3.9; Sodium 139    Lipid Panel    Component Value Date/Time   CHOL 203 (H) 05/13/2019 6203   TRIG 226 (H) 05/13/2019 5597   HDL 29 (L) 05/13/2019 0632   CHOLHDL 7.0 05/13/2019 0632   VLDL 45 (H) 05/13/2019 0632   LDLCALC 129 (H) 05/13/2019 4163   LDLDIRECT 175.0 03/24/2016 1328      Wt Readings from Last 3 Encounters:  11/15/21 151 lb 0.2 oz (68.5 kg)  04/15/21 146 lb 6.2 oz (66.4 kg)  09/11/20 140 lb (63.5 kg)      ASSESSMENT: Recent urosepsis thought secondary to nephrolithiasis PAF-CHA2DS2-VASc score 5, on Eliquis and amiodarone Elevated troponin-NSTEMI Acute systolic heart failure, LVEF 40 to 45% Dyspnea - severe diffusion defect on PFT's, refused oxygen Hypertension History of stroke with lower extremity paraplegia and significant muscle wasting CKD 4 - Baseline creatinine 1.8  PVCs DNR  PLAN: Robert Rowe was recently hospitalized with flank/left upper quadrant pain and thought to have sepsis secondary to nephrolithiasis.  He improved with antibiotics but was found to be in A. fib with RVR, now on Eliquis.  He was started on amiodarone and is rate controlled with what sounds like  regular rhythm.  He was also found to have new onset systolic heart failure with LVEF 40 to 45% and elevated troponin suggestive of NSTEMI.  He is not a candidate for intervention given his advanced creatinine.  He seems to be feeling better and his rhythm is regular today.  We will continue his low-dose amiodarone 200 mg daily as well as Eliquis.  Renal function will not allow ACE, ARB or Arni.  He is  appropriately DNR given his age and comorbidities and continued supportive care is recommended.  Follow-up in 6 months.  Pixie Casino, MD, [facility], [place] Director of the Advanced Lipid Disorders &  Cardiovascular Risk Reduction Clinic Diplomate of the American Board of Clinical Lipidology Attending Cardiologist  Direct Dial: [phone]   Fax: [phone]  Website:  [email or web address]   12/09/2021 12:13 PM

## 2021-12-09 NOTE — Patient Instructions (Signed)
Medication Instructions:  Your physician recommends that you continue on your current medications as directed. Please refer to the Current Medication list given to you today.  *If you need a refill on your cardiac medications before your next appointment, please call your pharmacy*   Follow-Up: At [facility], you and your health needs are our priority.  As part of our continuing mission to provide you with exceptional heart care, we have created designated Provider Care Teams.  These Care Teams include your primary Cardiologist (physician) and Advanced Practice Providers (APPs -  Physician Assistants and Nurse Practitioners) who all work together to provide you with the care you need, when you need it.  We recommend signing up for the patient portal called "MyChart".  Sign up information is provided on this After Visit Summary.  MyChart is used to connect with patients for Virtual Visits (Telemedicine).  Patients are able to view lab/test results, encounter notes, upcoming appointments, etc.  Non-urgent messages can be sent to your provider as well.   To learn more about what you can do with MyChart, go to [email or web address].    Your next appointment:   6 month(s)  The format for your next appointment:   Dr. [name]

## 2021-12-10 DIAGNOSIS — E785 Hyperlipidemia, unspecified: Secondary | ICD-10-CM | POA: Diagnosis not present

## 2021-12-10 DIAGNOSIS — I509 Heart failure, unspecified: Secondary | ICD-10-CM | POA: Diagnosis not present

## 2021-12-10 DIAGNOSIS — G8191 Hemiplegia, unspecified affecting right dominant side: Secondary | ICD-10-CM | POA: Diagnosis not present

## 2021-12-10 DIAGNOSIS — M6281 Muscle weakness (generalized): Secondary | ICD-10-CM | POA: Diagnosis not present

## 2021-12-10 DIAGNOSIS — Z7409 Other reduced mobility: Secondary | ICD-10-CM | POA: Diagnosis not present

## 2021-12-10 DIAGNOSIS — N184 Chronic kidney disease, stage 4 (severe): Secondary | ICD-10-CM | POA: Diagnosis not present

## 2021-12-11 DIAGNOSIS — M6281 Muscle weakness (generalized): Secondary | ICD-10-CM | POA: Diagnosis not present

## 2021-12-11 DIAGNOSIS — Z7409 Other reduced mobility: Secondary | ICD-10-CM | POA: Diagnosis not present

## 2021-12-11 DIAGNOSIS — I509 Heart failure, unspecified: Secondary | ICD-10-CM | POA: Diagnosis not present

## 2021-12-11 DIAGNOSIS — E785 Hyperlipidemia, unspecified: Secondary | ICD-10-CM | POA: Diagnosis not present

## 2021-12-11 DIAGNOSIS — G8191 Hemiplegia, unspecified affecting right dominant side: Secondary | ICD-10-CM | POA: Diagnosis not present

## 2021-12-11 DIAGNOSIS — N184 Chronic kidney disease, stage 4 (severe): Secondary | ICD-10-CM | POA: Diagnosis not present

## 2021-12-13 ENCOUNTER — Telehealth: Payer: Self-pay | Admitting: Family Medicine

## 2021-12-13 DIAGNOSIS — Z7409 Other reduced mobility: Secondary | ICD-10-CM | POA: Diagnosis not present

## 2021-12-13 DIAGNOSIS — M6281 Muscle weakness (generalized): Secondary | ICD-10-CM | POA: Diagnosis not present

## 2021-12-13 DIAGNOSIS — N184 Chronic kidney disease, stage 4 (severe): Secondary | ICD-10-CM | POA: Diagnosis not present

## 2021-12-13 DIAGNOSIS — E785 Hyperlipidemia, unspecified: Secondary | ICD-10-CM | POA: Diagnosis not present

## 2021-12-13 DIAGNOSIS — I509 Heart failure, unspecified: Secondary | ICD-10-CM | POA: Diagnosis not present

## 2021-12-13 DIAGNOSIS — G8191 Hemiplegia, unspecified affecting right dominant side: Secondary | ICD-10-CM | POA: Diagnosis not present

## 2021-12-13 NOTE — Telephone Encounter (Signed)
Robert Rowe from [facility] called to get  verbal orders for OT, frequency is one week eight.       Good callback number [phone]  Okay to leave voicemail      Please advise

## 2021-12-13 NOTE — Telephone Encounter (Signed)
Left a detailed message on verified voice mail giving the ok for orders.

## 2021-12-14 ENCOUNTER — Telehealth: Payer: Self-pay | Admitting: Internal Medicine

## 2021-12-14 MED ORDER — MIDODRINE HCL 10 MG PO TABS: 10.0000 mg | ORAL_TABLET | Freq: Two times a day (BID) | ORAL | 0 refills | Status: DC

## 2021-12-14 NOTE — Telephone Encounter (Signed)
°*  STAT* If patient is at the pharmacy, call can be transferred to refill team.   1. Which medications need to be refilled? (please list name of each medication and dose if known)  midodrine (PROAMATINE) 10 MG tablet  2. Which pharmacy/location (including street and city if local pharmacy) is medication to be sent to?[id number] [place] [place] [facility] [address]  Phone:  [phone] Fax:  [phone]   3. Do they need a 30 day or 90 day supply? 60 ds

## 2021-12-15 DIAGNOSIS — N184 Chronic kidney disease, stage 4 (severe): Secondary | ICD-10-CM | POA: Diagnosis not present

## 2021-12-15 DIAGNOSIS — E785 Hyperlipidemia, unspecified: Secondary | ICD-10-CM | POA: Diagnosis not present

## 2021-12-15 DIAGNOSIS — G8191 Hemiplegia, unspecified affecting right dominant side: Secondary | ICD-10-CM | POA: Diagnosis not present

## 2021-12-15 DIAGNOSIS — I509 Heart failure, unspecified: Secondary | ICD-10-CM | POA: Diagnosis not present

## 2021-12-15 DIAGNOSIS — Z7409 Other reduced mobility: Secondary | ICD-10-CM | POA: Diagnosis not present

## 2021-12-15 DIAGNOSIS — M6281 Muscle weakness (generalized): Secondary | ICD-10-CM | POA: Diagnosis not present

## 2021-12-16 ENCOUNTER — Telehealth: Payer: Self-pay | Admitting: Family Medicine

## 2021-12-16 DIAGNOSIS — N184 Chronic kidney disease, stage 4 (severe): Secondary | ICD-10-CM | POA: Diagnosis not present

## 2021-12-16 DIAGNOSIS — G8191 Hemiplegia, unspecified affecting right dominant side: Secondary | ICD-10-CM | POA: Diagnosis not present

## 2021-12-16 DIAGNOSIS — M6281 Muscle weakness (generalized): Secondary | ICD-10-CM | POA: Diagnosis not present

## 2021-12-16 DIAGNOSIS — I509 Heart failure, unspecified: Secondary | ICD-10-CM | POA: Diagnosis not present

## 2021-12-16 DIAGNOSIS — Z7409 Other reduced mobility: Secondary | ICD-10-CM | POA: Diagnosis not present

## 2021-12-16 DIAGNOSIS — E785 Hyperlipidemia, unspecified: Secondary | ICD-10-CM | POA: Diagnosis not present

## 2021-12-16 NOTE — Telephone Encounter (Signed)
Please advise 

## 2021-12-16 NOTE — Telephone Encounter (Signed)
[name] from [facility] call and stated she need a verbal approve for a plan of care  for pt Robert Rowe 's # is [phone]

## 2021-12-17 NOTE — Telephone Encounter (Signed)
Lvm with verbal okay for plan of care

## 2021-12-18 DIAGNOSIS — G8191 Hemiplegia, unspecified affecting right dominant side: Secondary | ICD-10-CM | POA: Diagnosis not present

## 2021-12-18 DIAGNOSIS — Z7409 Other reduced mobility: Secondary | ICD-10-CM | POA: Diagnosis not present

## 2021-12-18 DIAGNOSIS — E785 Hyperlipidemia, unspecified: Secondary | ICD-10-CM | POA: Diagnosis not present

## 2021-12-18 DIAGNOSIS — M6281 Muscle weakness (generalized): Secondary | ICD-10-CM | POA: Diagnosis not present

## 2021-12-18 DIAGNOSIS — N184 Chronic kidney disease, stage 4 (severe): Secondary | ICD-10-CM | POA: Diagnosis not present

## 2021-12-18 DIAGNOSIS — I509 Heart failure, unspecified: Secondary | ICD-10-CM | POA: Diagnosis not present

## 2021-12-19 ENCOUNTER — Other Ambulatory Visit: Payer: Self-pay | Admitting: Family Medicine

## 2021-12-20 DIAGNOSIS — Z7409 Other reduced mobility: Secondary | ICD-10-CM | POA: Diagnosis not present

## 2021-12-20 DIAGNOSIS — N184 Chronic kidney disease, stage 4 (severe): Secondary | ICD-10-CM | POA: Diagnosis not present

## 2021-12-20 DIAGNOSIS — M6281 Muscle weakness (generalized): Secondary | ICD-10-CM | POA: Diagnosis not present

## 2021-12-20 DIAGNOSIS — G8191 Hemiplegia, unspecified affecting right dominant side: Secondary | ICD-10-CM | POA: Diagnosis not present

## 2021-12-20 DIAGNOSIS — I509 Heart failure, unspecified: Secondary | ICD-10-CM | POA: Diagnosis not present

## 2021-12-20 DIAGNOSIS — E785 Hyperlipidemia, unspecified: Secondary | ICD-10-CM | POA: Diagnosis not present

## 2021-12-20 NOTE — Telephone Encounter (Signed)
Please advise. Rx is not on the current med list 

## 2021-12-22 DIAGNOSIS — G8191 Hemiplegia, unspecified affecting right dominant side: Secondary | ICD-10-CM | POA: Diagnosis not present

## 2021-12-22 DIAGNOSIS — I509 Heart failure, unspecified: Secondary | ICD-10-CM | POA: Diagnosis not present

## 2021-12-22 DIAGNOSIS — Z7409 Other reduced mobility: Secondary | ICD-10-CM | POA: Diagnosis not present

## 2021-12-22 DIAGNOSIS — E785 Hyperlipidemia, unspecified: Secondary | ICD-10-CM | POA: Diagnosis not present

## 2021-12-22 DIAGNOSIS — N184 Chronic kidney disease, stage 4 (severe): Secondary | ICD-10-CM | POA: Diagnosis not present

## 2021-12-22 DIAGNOSIS — M6281 Muscle weakness (generalized): Secondary | ICD-10-CM | POA: Diagnosis not present

## 2021-12-23 ENCOUNTER — Other Ambulatory Visit: Payer: Self-pay | Admitting: Family Medicine

## 2021-12-23 DIAGNOSIS — N184 Chronic kidney disease, stage 4 (severe): Secondary | ICD-10-CM | POA: Diagnosis not present

## 2021-12-23 DIAGNOSIS — Z7409 Other reduced mobility: Secondary | ICD-10-CM | POA: Diagnosis not present

## 2021-12-23 DIAGNOSIS — I509 Heart failure, unspecified: Secondary | ICD-10-CM | POA: Diagnosis not present

## 2021-12-23 DIAGNOSIS — G8191 Hemiplegia, unspecified affecting right dominant side: Secondary | ICD-10-CM | POA: Diagnosis not present

## 2021-12-23 DIAGNOSIS — E785 Hyperlipidemia, unspecified: Secondary | ICD-10-CM | POA: Diagnosis not present

## 2021-12-23 DIAGNOSIS — M6281 Muscle weakness (generalized): Secondary | ICD-10-CM | POA: Diagnosis not present

## 2021-12-25 DIAGNOSIS — Z7409 Other reduced mobility: Secondary | ICD-10-CM | POA: Diagnosis not present

## 2021-12-25 DIAGNOSIS — N184 Chronic kidney disease, stage 4 (severe): Secondary | ICD-10-CM | POA: Diagnosis not present

## 2021-12-25 DIAGNOSIS — G8191 Hemiplegia, unspecified affecting right dominant side: Secondary | ICD-10-CM | POA: Diagnosis not present

## 2021-12-25 DIAGNOSIS — M6281 Muscle weakness (generalized): Secondary | ICD-10-CM | POA: Diagnosis not present

## 2021-12-25 DIAGNOSIS — E785 Hyperlipidemia, unspecified: Secondary | ICD-10-CM | POA: Diagnosis not present

## 2021-12-25 DIAGNOSIS — I509 Heart failure, unspecified: Secondary | ICD-10-CM | POA: Diagnosis not present

## 2021-12-27 ENCOUNTER — Telehealth: Payer: Self-pay | Admitting: Family Medicine

## 2021-12-27 NOTE — Telephone Encounter (Signed)
Patient spouse calling in requesting a refill for furosemide (LASIX) tablet 40 mg   [[id number]] to be sent to the pharmacy.  Patient and wife could be contacted at [phone].  Please advise.

## 2021-12-27 NOTE — Telephone Encounter (Signed)
Please advise Amelia Jo is not on the current med list.

## 2021-12-28 NOTE — Telephone Encounter (Signed)
Spoke with the patient's wife. She is aware that he should not be taking this medication anymore.

## 2022-01-01 DIAGNOSIS — M6281 Muscle weakness (generalized): Secondary | ICD-10-CM | POA: Diagnosis not present

## 2022-01-01 DIAGNOSIS — N184 Chronic kidney disease, stage 4 (severe): Secondary | ICD-10-CM | POA: Diagnosis not present

## 2022-01-01 DIAGNOSIS — Z7409 Other reduced mobility: Secondary | ICD-10-CM | POA: Diagnosis not present

## 2022-01-01 DIAGNOSIS — G8191 Hemiplegia, unspecified affecting right dominant side: Secondary | ICD-10-CM | POA: Diagnosis not present

## 2022-01-01 DIAGNOSIS — E785 Hyperlipidemia, unspecified: Secondary | ICD-10-CM | POA: Diagnosis not present

## 2022-01-01 DIAGNOSIS — I509 Heart failure, unspecified: Secondary | ICD-10-CM | POA: Diagnosis not present

## 2022-01-08 DIAGNOSIS — M6281 Muscle weakness (generalized): Secondary | ICD-10-CM | POA: Diagnosis not present

## 2022-01-08 DIAGNOSIS — N184 Chronic kidney disease, stage 4 (severe): Secondary | ICD-10-CM | POA: Diagnosis not present

## 2022-01-08 DIAGNOSIS — E785 Hyperlipidemia, unspecified: Secondary | ICD-10-CM | POA: Diagnosis not present

## 2022-01-08 DIAGNOSIS — Z7409 Other reduced mobility: Secondary | ICD-10-CM | POA: Diagnosis not present

## 2022-01-08 DIAGNOSIS — G8191 Hemiplegia, unspecified affecting right dominant side: Secondary | ICD-10-CM | POA: Diagnosis not present

## 2022-01-08 DIAGNOSIS — I509 Heart failure, unspecified: Secondary | ICD-10-CM | POA: Diagnosis not present

## 2022-01-09 ENCOUNTER — Other Ambulatory Visit: Payer: Self-pay | Admitting: Internal Medicine

## 2022-01-15 DIAGNOSIS — E785 Hyperlipidemia, unspecified: Secondary | ICD-10-CM | POA: Diagnosis not present

## 2022-01-15 DIAGNOSIS — Z7409 Other reduced mobility: Secondary | ICD-10-CM | POA: Diagnosis not present

## 2022-01-15 DIAGNOSIS — G8191 Hemiplegia, unspecified affecting right dominant side: Secondary | ICD-10-CM | POA: Diagnosis not present

## 2022-01-15 DIAGNOSIS — M6281 Muscle weakness (generalized): Secondary | ICD-10-CM | POA: Diagnosis not present

## 2022-01-15 DIAGNOSIS — N184 Chronic kidney disease, stage 4 (severe): Secondary | ICD-10-CM | POA: Diagnosis not present

## 2022-01-15 DIAGNOSIS — I509 Heart failure, unspecified: Secondary | ICD-10-CM | POA: Diagnosis not present

## 2022-01-21 ENCOUNTER — Telehealth: Payer: Self-pay | Admitting: Family Medicine

## 2022-01-21 NOTE — Telephone Encounter (Signed)
Please advise 

## 2022-01-21 NOTE — Telephone Encounter (Signed)
Juliann Pulse call from [facility] and stated pt oxygen get low when he is move around and sitting family to know what dr.Burchette want to do. ?

## 2022-01-24 NOTE — Telephone Encounter (Signed)
Called and spoke with Joellen Jersey.   She stated that when the patient is up doing activities his O2 stats is between 85-88, laying down O2 stats is between 95-96.    Joellen Jersey also stated that the patient has a blister on his right ankle that has burst, at this time no nurse is available to come out into the  home to dress the blister.  [facility] physical therapy is going to discharge the patient today.    She also suggested that the patient maybe in need of Palliative care, nurse aid and nursing services.   Wife doesn't want the patient to be seen by Hospice care.  Wife to be notified with changes.        Please advise ?

## 2022-01-25 ENCOUNTER — Other Ambulatory Visit: Payer: Self-pay

## 2022-01-25 DIAGNOSIS — R531 Weakness: Secondary | ICD-10-CM

## 2022-01-25 NOTE — Telephone Encounter (Signed)
Referral submitted to [facility] ?

## 2022-01-25 NOTE — Telephone Encounter (Signed)
Spoke with patient wife Annamarie.  She is willing to have [facility] to come out and assess patient, but she wants to discuss cost, and doesn't want a huge bill to have to pay out of pocket.   I informed wife that when the nurse comes out for the initial assessment she can let him or her know her concerns with cost, care, etc. ?

## 2022-01-26 ENCOUNTER — Telehealth: Payer: Self-pay

## 2022-01-26 NOTE — Telephone Encounter (Signed)
Spoke with patient's spouse AnnMarie and scheduled a telephonic Palliative Consult for 01/31/22 @ 2:30 PM ? ?Consent obtained; updated Outlook/Netsmart/Team List and Epic.  ? ? ?

## 2022-01-31 ENCOUNTER — Other Ambulatory Visit: Payer: Self-pay

## 2022-01-31 ENCOUNTER — Other Ambulatory Visit: Payer: Medicare Other | Admitting: Hospice

## 2022-01-31 DIAGNOSIS — Z515 Encounter for palliative care: Secondary | ICD-10-CM | POA: Diagnosis not present

## 2022-01-31 DIAGNOSIS — I69354 Hemiplegia and hemiparesis following cerebral infarction affecting left non-dominant side: Secondary | ICD-10-CM | POA: Diagnosis not present

## 2022-01-31 DIAGNOSIS — I5032 Chronic diastolic (congestive) heart failure: Secondary | ICD-10-CM

## 2022-01-31 NOTE — Progress Notes (Signed)
Therapist, nutritional Palliative Care Consult Note Telephone: [phone]  Fax: [phone]  PATIENT NAME: Robert Rowe [address] [place] [address] [phone] (home)  DOB: 19-Mar-1918 MRN: [id number]  PRIMARY CARE PROVIDER:    Kristian Covey, MD,  [address] [place] [address] [phone]  REFERRING PROVIDER:   Kristian Covey, MD [address] [place],  [place] [address] [phone]  RESPONSIBLE PARTYDriscilla Moats Contact Information     Name Relation Home Work Mobile   Otis Spouse [phone]  [phone]   Celeste, Ruesga   [phone]       TELEHEALTH VISIT STATEMENT Due to the COVID-19 crisis, this visit was done via telemedicine from my office and it was initiated and consent by this patient and or family.  I connected with patient OR PROXY by a telephone/video  and verified that I am speaking with the correct person. I discussed the limitations of evaluation and management by telemedicine. The patient expressed understanding and agreed to proceed. Palliative Care was asked to follow this patient to address advance care planning, complex medical decision making and goals of care clarification.  Annmarie is with patient during visit this is the initial visit.     ASSESSMENT AND / RECOMMENDATIONS:   Advance Care Planning: Our advance care planning conversation included a discussion about:    The value and importance of advance care planning  Difference between Hospice and Palliative care Exploration of goals of care in the event of a sudden injury or illness  Identification and preparation of a healthcare agent  Review and updating or creation of an  advance directive document . Decision not to resuscitate or to de-escalate disease focused treatments due to poor prognosis.  CODE STATUS: Patient is a Do Not Resuscitate  Goals of Care: Goals include to maximize  quality of life and symptom management  I spent 16  minutes providing this initial consultation. More than 50% of the time in this consultation was spent on counseling patient and coordinating communication. --------------------------------------------------------------------------------------------------------------------------------------  Symptom Management/Plan: Hemiparesis/hemiplegia: Status post CVA; bed bound, non ambulatory, max assist with ADLs.  Fall precautions.  Active/passive range of motion exercises as tolerated.  Balance of rest and performance activity. CHF: Managed with Lasix prn. Followed by Cardiologist.  [place] to salt and liquid limits. Routine CBC CMP. Constipation: Managed with Milk of Magnesium, Senokot.  Encouraged spouse to give both today as ordered since patient has not had bowel movement in 3 days. Encouraged to call provider sooner with any concerns or if no bowel movement. Follow up: Palliative care will continue to follow for complex medical decision making, advance care planning, and clarification of goals. Return 6 weeks or prn. Encouraged to call provider sooner with any concerns.   Family /Caregiver/Community Supports: Patient lives at home with spouse. Patient enjoys company and loves to read.  He is a retired Development worker, community.  HOSPICE ELIGIBILITY/DIAGNOSIS: TBD  Chief Complaint: Initial Palliative care visit  HISTORY OF PRESENT ILLNESS:  Robert Rowe is a 88 y.o. year old male  with multiple morbidities requiring close monitoring and with high risk of complications and  mortality: Left hemiplegia/hemiparesis status post CVA, chronic diastolic congestive heart failure.  Patient denied pain/discomfort, endorsed mobility challenge and affirms no bowel movement in 3 days. History obtained from review of EMR, discussion with primary team, caregiver, family and/or Robert Rowe.  Review and summarization of Epic records shows history from other  than patient. Rest of 10  point ROS asked and negative.  I reviewed as needed, available labs, patient records, imaging, studies and related documents from the EMR.    PAST MEDICAL HISTORY:  Active Ambulatory Problems    Diagnosis Date Noted   Dyslipidemia 04/05/2007   GERD 12/03/2007   BENIGN PROSTATIC HYPERTROPHY 04/05/2007   Rheumatoid arthritis (HCC) 04/05/2007   Osteoarthritis 04/05/2007   PEDAL EDEMA 05/01/2008   PALPITATIONS 02/12/2010   CHEST DISCOMFORT 04/25/2009   History of cardiovascular disorder 04/05/2007   DOE (dyspnea on exertion) 03/23/2011   Dysphagia 02/22/2013   Stricture and stenosis of esophagus 03/12/2013   Hypothyroidism 05/09/2013   PVC's (premature ventricular contractions) 08/22/2013   Awareness alteration, transient 02/26/2015   Benign prostatic hyperplasia with urinary obstruction 03/19/2015   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   Intracranial vascular stenosis 11/09/2015   Mild cognitive impairment 11/09/2015   Chronic diastolic CHF (congestive heart failure) (HCC) 11/01/2016   Essential hypertension 11/01/2016   Weakness generalized 06/13/2017   Abnormal diffusion capacity determined by pulmonary function test 06/13/2017   CKD (chronic kidney disease) stage 3, GFR 30-59 ml/min (HCC) 05/08/2018   Chest pain 05/15/2018   TIA (transient ischemic attack) 12/31/2018   CVA (cerebral vascular accident) (HCC) 12/31/2018   History of stroke 01/01/2019   BPV (benign positional vertigo) 01/02/2019   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   Gait disturbance, post-stroke    Cholecystitis    Acute cholecystitis 01/14/2019   Palliative care by specialist    DNR (do not resuscitate) discussion    Pyelonephritis 11/10/2021   Sepsis (HCC) 11/10/2021   Acute kidney injury superimposed on CKD (HCC) 11/10/2021   Prolonged QT interval 11/10/2021   History of CVA (cerebrovascular accident) 11/10/2021   Paroxysmal atrial fibrillation (HCC) 11/23/2021   Resolved Ambulatory  Problems    Diagnosis Date Noted   Chest pain 11/15/2015   Pain in the chest    Intractable abdominal pain 01/13/2019   Acute diastolic CHF (congestive heart failure) (HCC) 04/13/2021   Acute on chronic heart failure (HCC) 04/13/2021   Acute exacerbation of CHF (congestive heart failure) (HCC) 04/14/2021   Past Medical History:  Diagnosis Date   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, 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   Chronic kidney disease    DJD (degenerative joint disease)    Dysrhythmia    Hiatal hernia    History of nuclear stress test    HYPERLIPIDEMIA 04/05/2007   Osteoarth NOS-Unspec 04/05/2007   Rheumatoid arthritis(714.0) 04/05/2007   Shortness of breath dyspnea    TRANSIENT ISCHEMIC ATTACK, HX OF 04/05/2007    SOCIAL HX:  Social History   Tobacco Use   Smoking status: Former    Types: Cigarettes    Quit date: 08/13/1953    Years since quitting: 68.5   Smokeless tobacco: Never   Tobacco comments:    1942-1954 up to 1 pack/day  Substance Use Topics   Alcohol use: Yes    Comment: occ vodka     FAMILY HX:  Family History  Problem Relation Age of Onset   Breast cancer Mother    Heart disease Father    Heart attack Father    Lymphoma Brother    Stroke Neg Hx    Hypertension Neg Hx       ALLERGIES:  Allergies  Allergen Reactions   Codeine Other (See Comments)    Patient was "all over the place"- undesired side effect   Sulfasalazine Other (See Comments)    Reaction not recalled   Sulfonamide  Derivatives Other (See Comments)    Reaction not recalled      PERTINENT MEDICATIONS:  Outpatient Encounter Medications as of 01/31/2022  Medication Sig   acetaminophen (TYLENOL) 325 MG tablet Take 650 mg by mouth at bedtime.   albuterol (VENTOLIN HFA) 108 (90 Base) MCG/ACT inhaler INHALE 2 PUFFS INTO THE LUNGS EVERY 6 HOURS AS NEEDED FOR WHEEZING (COUGH).   amiodarone (PACERONE) 200 MG tablet Take 1 tablet (200 mg) by mouth daily   apixaban (ELIQUIS) 2.5 MG TABS tablet Take 1 tablet (2.5 mg total) by  mouth 2 (two) times daily.   atorvastatin (LIPITOR) 40 MG tablet TAKE 1 TABLET BY MOUTH  DAILY AT 6 PM   ketoconazole (NIZORAL) 2 % cream APPLY 1 APPLICATION TOPICALLY 2 (TWO) TIMES DAILY AS NEEDED FOR IRRITATION.   latanoprost (XALATAN) 0.005 % ophthalmic solution Place 1 drop into both eyes at bedtime. For Glaucoma   levothyroxine (SYNTHROID) 50 MCG tablet TAKE 1 TABLET BY MOUTH  DAILY   magnesium hydroxide (MILK OF MAGNESIA) 400 MG/5ML suspension Take 30 mLs by mouth See admin instructions. Constipation (1 of 4 ) If no BM in 3 days, give 30 cc Milk of Magnesium p.o. x 1 dose in 24 hours as needed ( Do  Not use standing constipation orders for residents with renal failure CFR less than 30. Contact MD for orders)   meclizine (ANTIVERT) 12.5 MG tablet Take 1 tablet (12.5 mg total) by mouth 3 (three) times daily as needed for dizziness.   midodrine (PROAMATINE) 10 MG tablet TAKE 1 TABLET (10 MG TOTAL) BY MOUTH 2 (TWO) TIMES DAILY WITH A MEAL.   nitroGLYCERIN (NITROSTAT) 0.4 MG SL tablet Place 1 tablet (0.4 mg total) under the tongue every 5 (five) minutes as needed (for Esophageal spasms).   pantoprazole (PROTONIX) 40 MG tablet TAKE 1 TABLET BY MOUTH EVERY DAY   senna-docusate (SENOKOT-S) 8.6-50 MG tablet Take 1 tablet by mouth at bedtime as needed for moderate constipation.   triamcinolone (KENALOG) 0.1 % APPLY TO AFFECTED RASH TWICE DAILY AS NEEDED. (Patient taking differently: Apply 1 application topically 2 (two) times daily as needed (rash).)   No facility-administered encounter medications on file as of 01/31/2022.     Thank you for the opportunity to participate in the care of Mr. Mendez.  The palliative care team will continue to follow. Please call our office at [phone] if we can be of additional assistance.   Note: Portions of this note were generated with Scientist, clinical (histocompatibility and immunogenetics). Dictation errors may occur despite best attempts at proofreading.  Rosaura Carpenter, NP

## 2022-02-01 ENCOUNTER — Other Ambulatory Visit: Payer: Self-pay | Admitting: Family Medicine

## 2022-02-02 ENCOUNTER — Other Ambulatory Visit: Payer: Self-pay

## 2022-02-02 ENCOUNTER — Telehealth: Payer: Self-pay | Admitting: Family Medicine

## 2022-02-02 NOTE — Telephone Encounter (Signed)
Left a message for the pt to return my call.  

## 2022-02-02 NOTE — Telephone Encounter (Signed)
Patient spouse called in stating that patient is needing a refill for metoprolol succinate (TOPROL-XL) 25 MG 24 hr tablet [29858] to be sent to his pharmacy. ? ? ?Patient haven't been seen in office due to being bed written. ? ?Patient could be contacted at [phone]. ? ? ?[id number] [place], [place] - [address] [id number]  ? ? ?Please advise. ?

## 2022-02-02 NOTE — Telephone Encounter (Signed)
Pt's wife informed of the results and verbalized understanding.  ?

## 2022-03-10 IMAGING — DX DG CHEST 1V PORT
1 series · 1 of 1 positions shown · non-contrast
Comparison: 11/11/2021

CLINICAL DATA: Shortness of breath with wheezing and cough.

EXAM:
PORTABLE CHEST 1 VIEW

[chest ap]
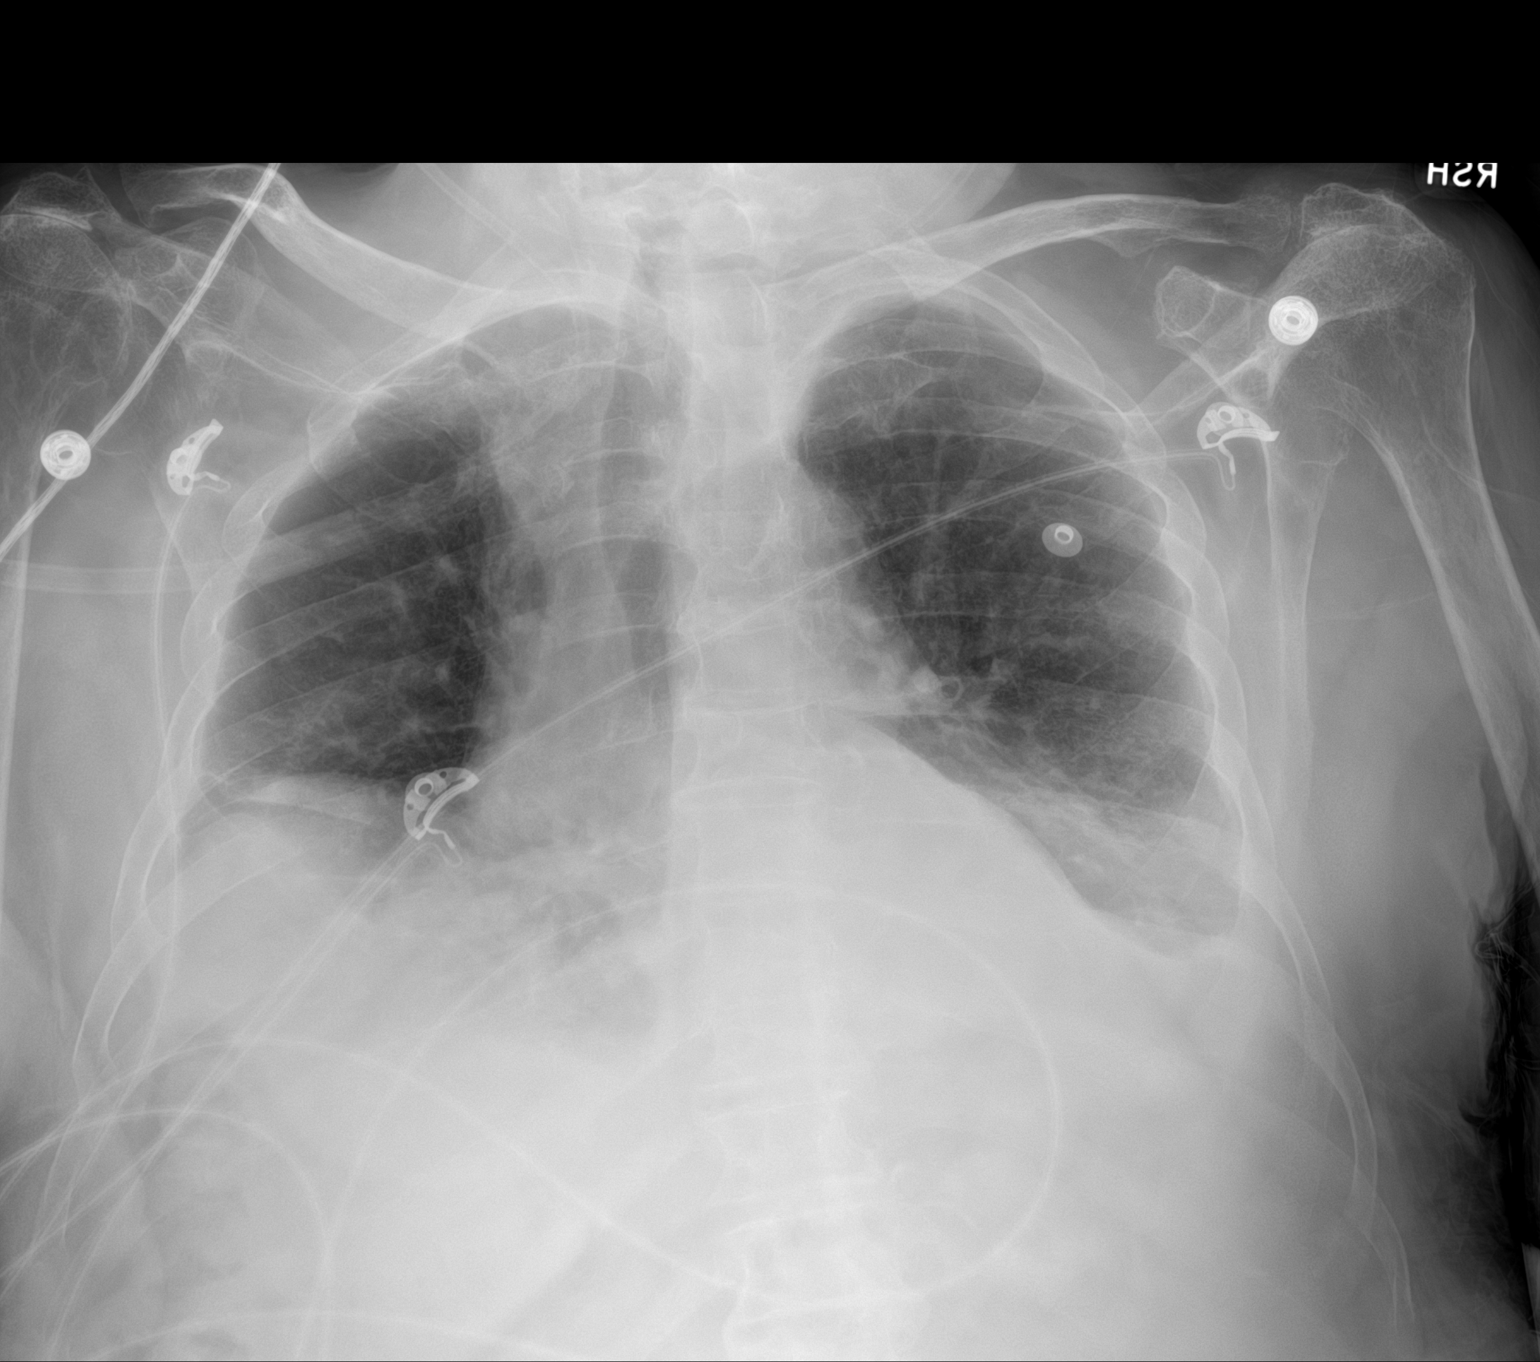

[1 of 1 positions shown; findings below may reference images not displayed]

FINDINGS: 3835 hours. Low volumes. Bibasilar collapse/consolidation with small
bilateral pleural effusions. The cardio pericardial silhouette is
enlarged. The visualized bony structures of the thorax show no acute
abnormality. Telemetry leads overlie the chest.
IMPRESSION: Mild progression of bibasilar collapse/consolidation with small
bilateral pleural effusions.

## 2022-03-12 IMAGING — DX DG CHEST 1V PORT
1 series · 1 of 1 positions shown · non-contrast
Comparison: 11/12/2021

CLINICAL DATA: Shortness of breath

EXAM:
PORTABLE CHEST 1 VIEW

[chest ap]
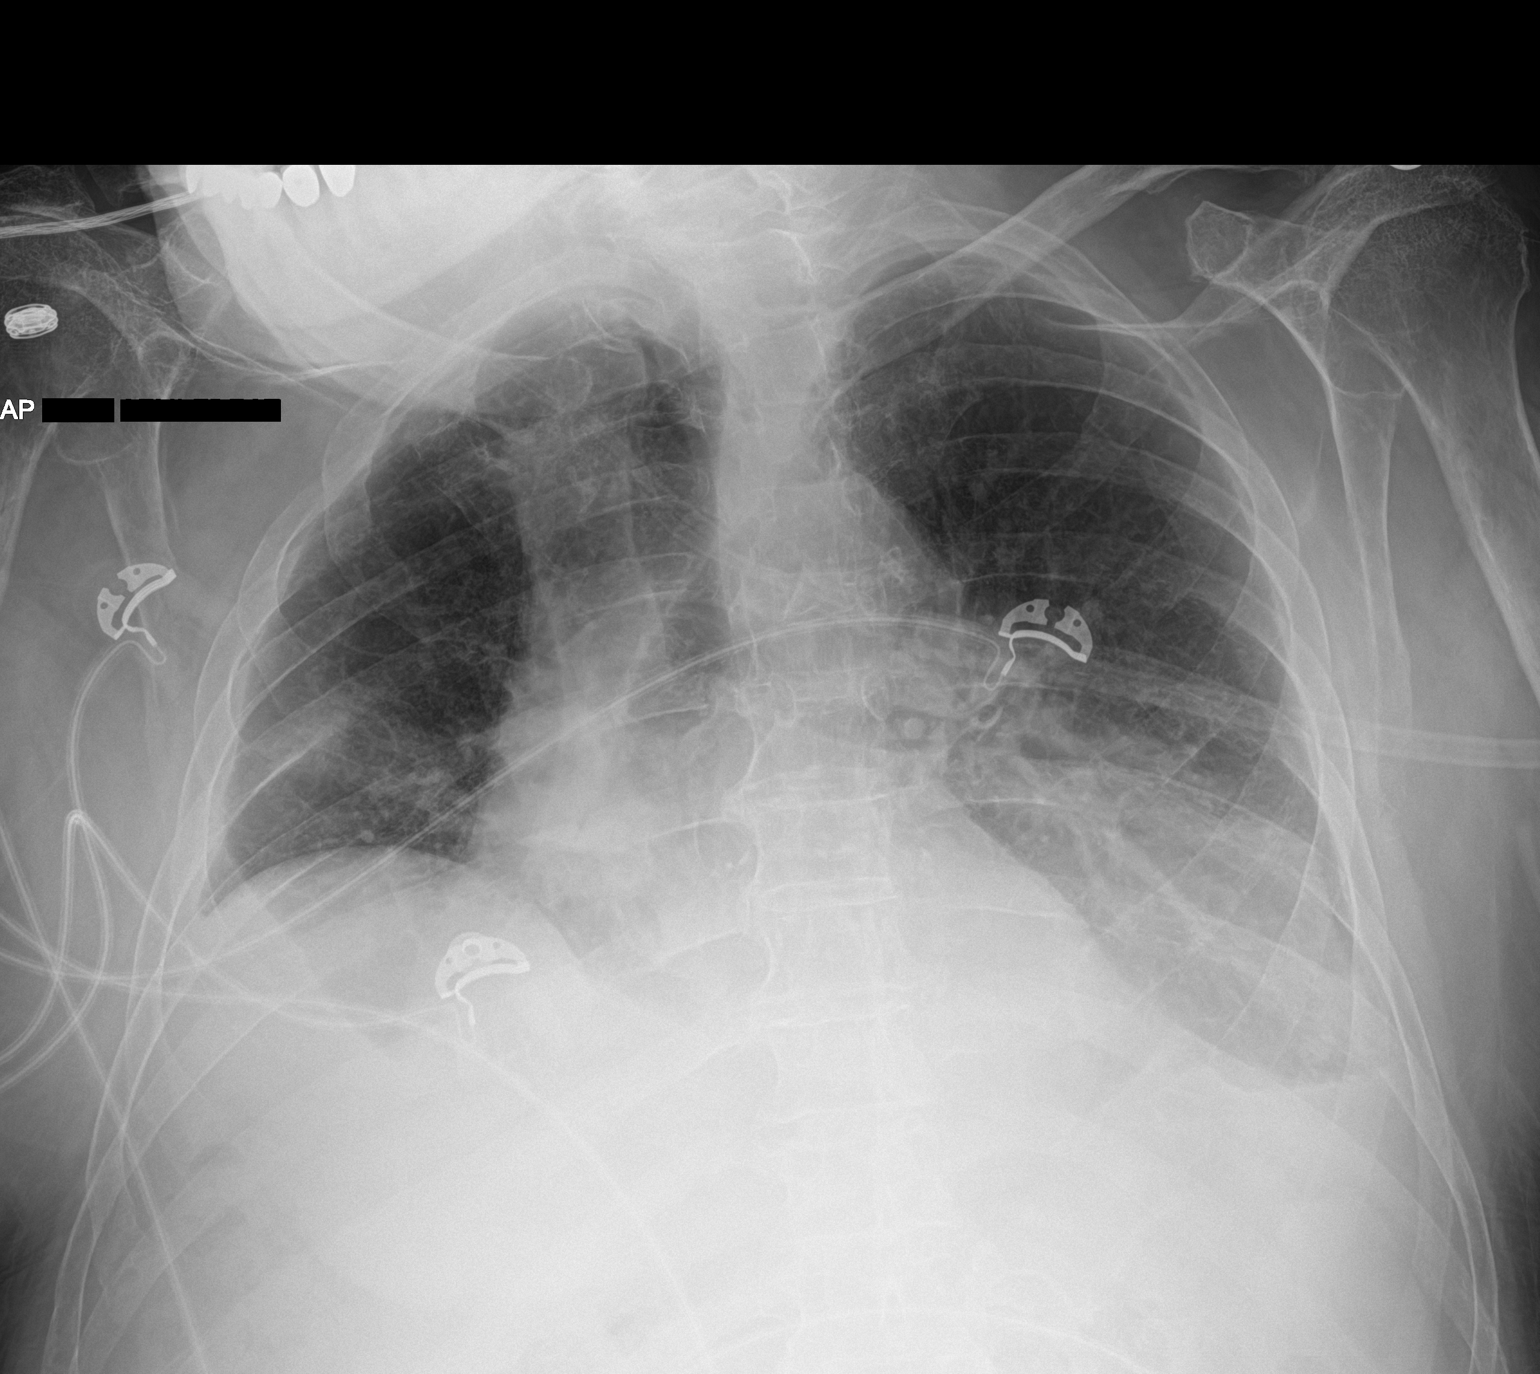

[1 of 1 positions shown; findings below may reference images not displayed]

FINDINGS: Heart is normal size. Bilateral lower lobe opacities. Some
improvement on the right since prior study. Findings similar on the
left with layering left effusion. No acute bony abnormality.
IMPRESSION: Layering left effusion with stable left basilar atelectasis or
infiltrate.

Improving right base atelectasis.

## 2022-03-13 IMAGING — DX DG CHEST 1V PORT
1 series · 1 of 1 positions shown · non-contrast
Comparison: 11/14/2021

CLINICAL DATA: Shortness of breath

EXAM:
PORTABLE CHEST 1 VIEW

[chest]
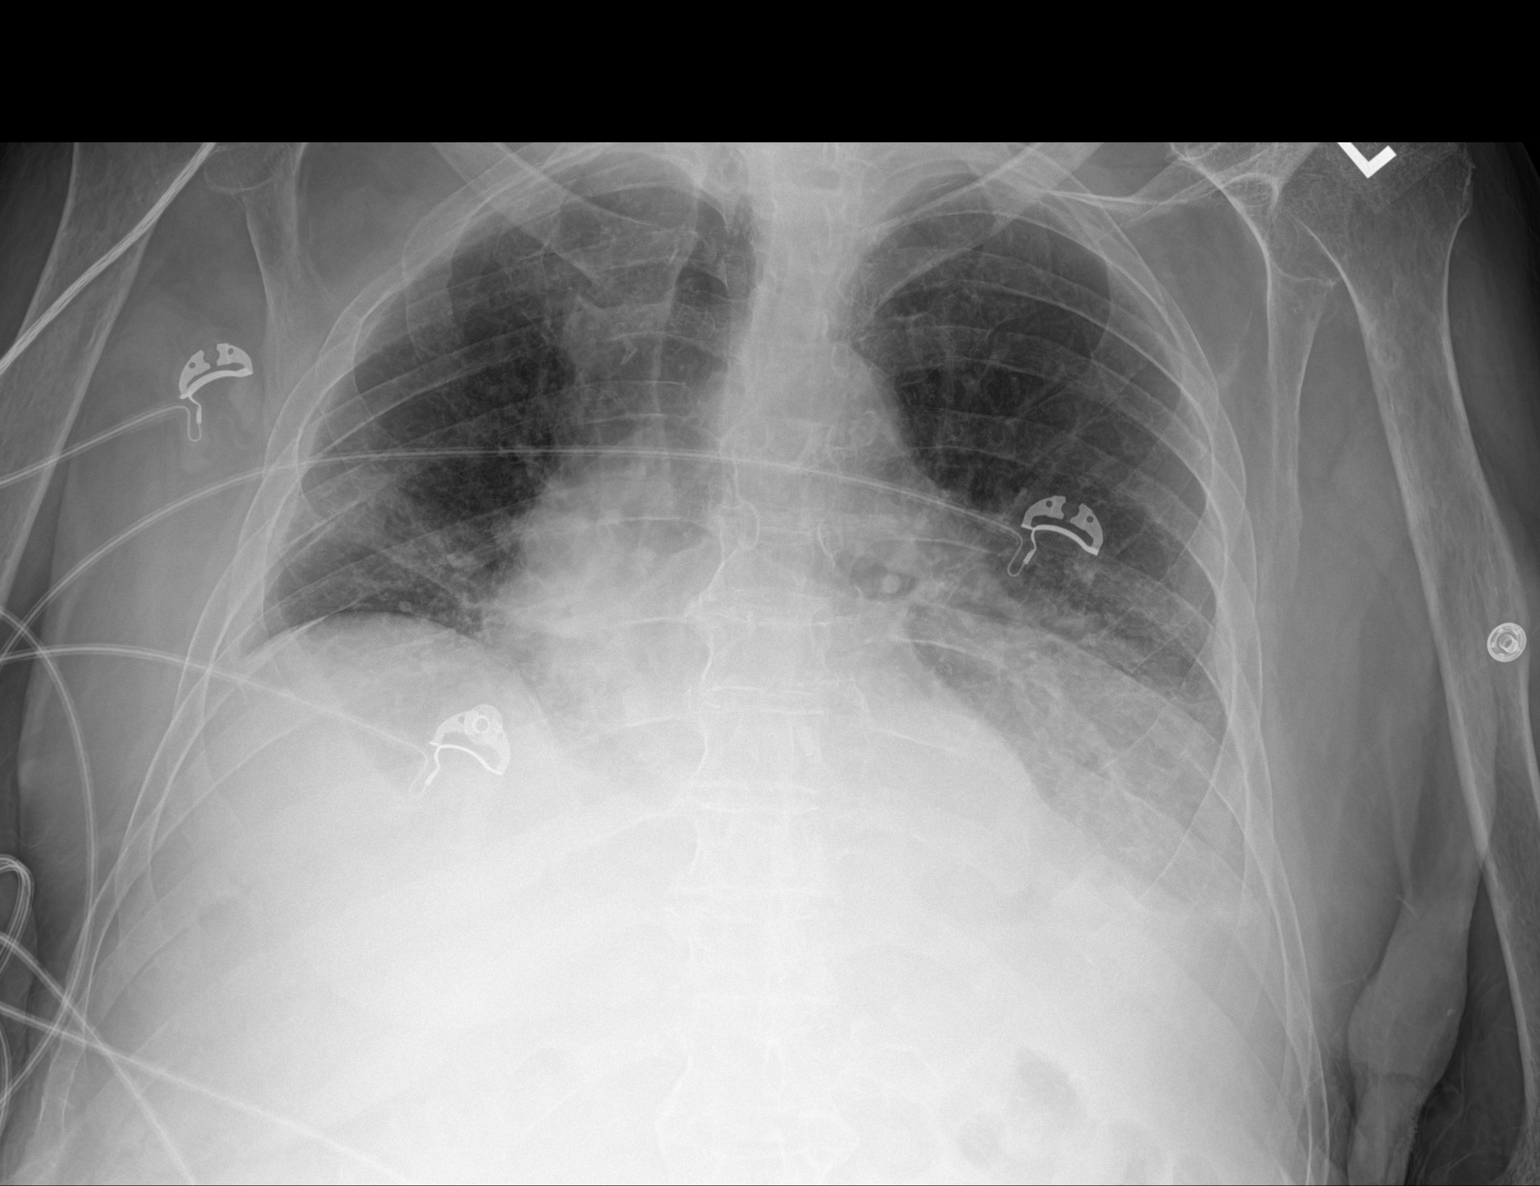

[1 of 1 positions shown; findings below may reference images not displayed]

FINDINGS: Small layering left pleural effusion. Associated bilateral lower
lobe atelectasis. Low lung volumes. No frank interstitial edema. No
pneumothorax.

The heart is normal in size.
IMPRESSION: Small layering left pleural effusion with associated bilateral lower
lobe atelectasis.

## 2022-03-14 ENCOUNTER — Other Ambulatory Visit: Payer: Medicare Other | Admitting: Hospice

## 2022-03-14 DIAGNOSIS — Z515 Encounter for palliative care: Secondary | ICD-10-CM

## 2022-03-14 DIAGNOSIS — I69354 Hemiplegia and hemiparesis following cerebral infarction affecting left non-dominant side: Secondary | ICD-10-CM

## 2022-03-14 DIAGNOSIS — F0390 Unspecified dementia without behavioral disturbance: Secondary | ICD-10-CM

## 2022-03-14 DIAGNOSIS — I951 Orthostatic hypotension: Secondary | ICD-10-CM

## 2022-03-14 DIAGNOSIS — I5032 Chronic diastolic (congestive) heart failure: Secondary | ICD-10-CM

## 2022-03-14 NOTE — Progress Notes (Signed)
? ? ?Manufacturing engineer ?Community Palliative Care Consult Note ?Telephone: [phone]  ?Fax: [phone] ? ?PATIENT NAME: Robert Rowe ?[address] ?[place] [address] ?[phone] (home)  ?DOB: 09/26/1918 ?MRN: [id number] ? ?PRIMARY CARE PROVIDER:    ?[name], MD,  ?[address] [address] ?[phone] ? ?REFERRING PROVIDER:   ?[name], MD ?[address],  [place] [address] ?[phone] ? ?RESPONSIBLE PARTY:   SWF[facility]NAT[facility] [phone] ?Caregiver - Helene Kelp [phone] ?Contact Information   ? ? Name Relation Home Work Mobile  ? Vasallo,Annemarie Spouse [phone]  [phone]  ? Kenshin, Splawn Son   [phone]  ? ?  ? ? ? ?Palliative Care was asked to follow this patient to address advance care planning, complex medical decision making and goals of care clarification.  Annmarie is with patient during visit this is a follow-up visit.  Annmarie and Helene Kelp are with patient during visit. ? ?  ASSESSMENT AND / RECOMMENDATIONS:  ? ?CODE STATUS: Patient is a Do Not Resuscitate.  NP signed DNR form for patient to keep at home; same document uploaded to epic. ? ?Goals of Care: Goals include to maximize quality of life and symptom management.  ?Hospice service is desired when patient qualifies for it.  ? ? ?Symptom Management/Plan: ?Dementia: Progressive memory loss/confusion in line with Dementia disease trajectory.  Incontinent of bowel and bladder FAST 6D.  Encourage reminiscence and use cueing as needed.  Encourage water//puzzles.  Continue reading books of choice.  Continue ongoing supportive care.  Routine CBC CMP ?Hemiparesis/hemiplegia: Status post CVA 2020; bed bound, non ambulatory, max assist with ADLs.  Fall precautions.  Active/passive range of motion exercises as tolerated.  Balance of rest and performance activity. ?Orthostatic hypotension: Lasix on hold for now. Managed with Midodrine.  BP readings at home within normal  range.  ?CHF: Lasix on hold for now. Adhere to salt and liquid limits.  Elevate bilateral lower extremities.  Reports edema or weight gain of 2 pounds/day or 5 pounds/week. ?Constipation: Managed with Milk of Magnesium, Senokot.  Spouse also uses  prune juice. Encouraged to call provider sooner with any concerns or if no bowel movement. ?Follow up: Palliative care will continue to follow for complex medical decision making, advance care planning, and clarification of goals. Return 6 weeks or prn. Encouraged to call provider sooner with any concerns.  ? ?Family /Caregiver/Community Supports: Patient lives at home with spouse who is her primary caregiver. Patient enjoys company from neighbors and loves to read.  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 ? ?HOSPICE ELIGIBILITY/DIAGNOSIS: TBD ? ?Chief Complaint: Follow up visit ? ?HISTORY OF PRESENT ILLNESS:  Robert Rowe is a 88 y.o. year old male  with multiple morbidities requiring close monitoring and with high risk of complications and  mortality: Progressive worsening dementia, left hemiplegia/hemiparesis status post CVA, chronic diastolic congestive heart failure.  Patient endorses weakness, impaired mobility and some memory loss.  Patient denies pain/discomfort; no headaches or dizziness.  History obtained from review of EMR, discussion with primary team, caregiver, family and/or Mr. Saadeh.  ?Review and summarization of Epic records shows history from other than patient. Rest of 10 point ROS asked and negative.  ?I reviewed as needed, available labs, patient records, imaging, studies and related documents from the EMR.  Independent interpretation of tests results ? ?Physical Exam: ?Height/Weight: 5 feet 5 inches/145 Ibs ?BP 110/60 P 81 R 18 O2 92% room  air ?Constitutional: NAD ?General: Well groomed, cooperative ?EYES: anicteric sclera, lids intact, no discharge  ?ENMT: Moist mucous membrane ?CV: S1 S2, RRR, no LE  edema ?Pulmonary: LCTA, no increased work of breathing, no cough, ?Abdomen: active BS + 4 quadrants, soft and non tender ?GU: no suprapubic tenderness ?MSK: weakness, nonambulatory, limited ROM ?Skin: warm and dry, no rashes or wounds on visible skin ?Neuro:  weakness, otherwise non focal, memory loss ?Psych: non-anxious affect ?Hem/lymph/immuno: no widespread bruising ? ?PAST MEDICAL HISTORY:  ?Active Ambulatory Problems  ?  Diagnosis Date Noted  ? Dyslipidemia 04/05/2007  ? GERD 12/03/2007  ? BENIGN PROSTATIC HYPERTROPHY 04/05/2007  ? Rheumatoid arthritis ([place]) 04/05/2007  ? Osteoarthritis 04/05/2007  ? PEDAL EDEMA 05/01/2008  ? PALPITATIONS 02/12/2010  ? CHEST DISCOMFORT 04/25/2009  ? History of cardiovascular disorder 04/05/2007  ? DOE (dyspnea on exertion) 03/23/2011  ? Dysphagia 02/22/2013  ? Stricture and stenosis of esophagus 03/12/2013  ? Hypothyroidism 05/09/2013  ? PVC's (premature ventricular contractions) 08/22/2013  ? Awareness alteration, transient 02/26/2015  ? Benign prostatic hyperplasia with urinary obstruction 03/19/2015  ? 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 Intracranial vascular stenosis 11/09/2015  ? Mild cognitive impairment 11/09/2015  ? Chronic diastolic CHF (congestive heart failure) ([place]) 11/01/2016  ? Essential hypertension 11/01/2016  ? Weakness generalized 06/13/2017  ? Abnormal diffusion capacity determined by pulmonary function test 06/13/2017  ? CKD (chronic kidney disease) stage 3, GFR 30-59 ml/min (HCC) 05/08/2018  ? Chest pain 05/15/2018  ? TIA (transient ischemic attack) 12/31/2018  ? CVA (cerebral vascular accident) ([place]) 12/31/2018  ? History of stroke 01/01/2019  ? BPV (benign positional vertigo) 01/02/2019  ? 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 Gait disturbance, post-stroke   ? Cholecystitis   ? Acute cholecystitis 01/14/2019  ? Palliative care by specialist   ? DNR (do not resuscitate) discussion   ? Pyelonephritis 11/10/2021  ? Sepsis ([place]) 11/10/2021  ?  Acute kidney injury superimposed on CKD ([place]) 11/10/2021  ? Prolonged QT interval 11/10/2021  ? History of CVA (cerebrovascular accident) 11/10/2021  ? Paroxysmal atrial fibrillation ([place]) 11/23/2021  ? ?Resolved Ambulatory Problems  ?  Diagnosis Date Noted  ? Chest pain 11/15/2015  ? Pain in the chest   ? Intractable abdominal pain 01/13/2019  ? Acute diastolic CHF (congestive heart failure) ([place]) 04/13/2021  ? Acute on chronic heart failure ([place]) 04/13/2021  ? Acute exacerbation of CHF (congestive heart failure) ([place]) 04/14/2021  ? ?Past Medical History:  ?Diagnosis Date  ? 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 Chronic kidney disease   ? DJD (degenerative joint disease)   ? Dysrhythmia   ? Hiatal hernia   ? History of nuclear stress test   ? HYPERLIPIDEMIA 04/05/2007  ? Osteoarth NOS-Unspec 04/05/2007  ? Rheumatoid arthritis(714.0) 04/05/2007  ? Shortness of breath dyspnea   ? TRANSIENT ISCHEMIC ATTACK, HX OF 04/05/2007  ? ? ?SOCIAL HX:  ?Social History  ? ?Tobacco Use  ? Smoking status: Former  ?  Types: Cigarettes  ?  Quit date: 08/13/1953  ?  Years since quitting: 68.6  ? Smokeless tobacco: Never  ? Tobacco comments:  ?  1942-1954 up to 1 pack/day  ?Substance Use Topics  ? Alcohol use: Yes  ?  Comment: occ vodka  ? ?  ?FAMILY HX:  ?Family History  ?Problem Relation Age of Onset  ? Breast cancer Mother   ? Heart disease Father   ? Heart attack Father   ? Lymphoma Brother   ? Stroke Neg Hx   ?  Hypertension Neg Hx   ?   ? ?ALLERGIES:  ?Allergies  ?Allergen Reactions  ? Codeine Other (See Comments)  ?  Patient was "all over the place"- undesired side effect  ? Sulfasalazine Other (See Comments)  ?  Reaction not recalled  ? Sulfonamide Derivatives Other (See Comments)  ?  Reaction not recalled  ?   ? ?PERTINENT MEDICATIONS:  ?Outpatient Encounter Medications as of 03/14/2022  ?Medication Sig  ? acetaminophen (TYLENOL) 325 MG tablet Take 650 mg by mouth at bedtime.  ? albuterol (VENTOLIN HFA) 108 (90 Base) MCG/ACT inhaler INHALE  2 PUFFS INTO THE LUNGS EVERY 6 HOURS AS NEEDED FOR WHEEZING (COUGH).  ? amiodarone (PACERONE) 200 MG tablet Take 1 tablet (200 mg) by mouth daily  ? apixaban (ELIQUIS) 2.5 MG TABS tablet Take 1 tablet (2.5 mg

## 2022-04-08 ENCOUNTER — Other Ambulatory Visit: Payer: Self-pay | Admitting: Internal Medicine

## 2022-04-11 ENCOUNTER — Other Ambulatory Visit: Payer: Medicare Other | Admitting: Hospice

## 2022-04-11 DIAGNOSIS — R238 Other skin changes: Secondary | ICD-10-CM

## 2022-04-11 DIAGNOSIS — K5901 Slow transit constipation: Secondary | ICD-10-CM

## 2022-04-11 DIAGNOSIS — Z515 Encounter for palliative care: Secondary | ICD-10-CM | POA: Diagnosis not present

## 2022-04-11 DIAGNOSIS — I5032 Chronic diastolic (congestive) heart failure: Secondary | ICD-10-CM

## 2022-04-11 DIAGNOSIS — I69354 Hemiplegia and hemiparesis following cerebral infarction affecting left non-dominant side: Secondary | ICD-10-CM | POA: Diagnosis not present

## 2022-04-11 DIAGNOSIS — F0390 Unspecified dementia without behavioral disturbance: Secondary | ICD-10-CM

## 2022-04-11 NOTE — Progress Notes (Signed)
[facility] Palliative Care Consult Note Telephone: [phone]  Fax: [phone]  PATIENT NAME: Robert Rowe [address] [place] [address] [id number] (home)  DOB: October 25, 1918 MRN: [id number]  PRIMARY CARE PROVIDER:    [name], MD,  [place] [address] [phone]  REFERRING PROVIDER:   [name], MD [address] [place],   [address] [phone]  RESPONSIBLE PARTY:   Annemarie Norwood     Name Relation Home Work Mobile   [facility] Spouse [phone]  [phone]   [place], Mcmiller   [phone]       Palliative Care was asked to follow this patient to address advance care planning, complex medical decision making and goals of care clarification.  Annmarie is with patient during visit this is a follow-up visit.  Annmarie and Helene Kelp are with patient during visit.    ASSESSMENT AND / RECOMMENDATIONS:   CODE STATUS: Patient is a Do Not Resuscitate.  NP signed DNR form for patient to keep at home; same document uploaded to epic.  Goals of Care: Goals include to maximize quality of life and symptom management.  Hospice service is desired when patient qualifies for it.    Symptom Management/Plan: Dementia: Progressive memory loss/confusion in line with Dementia disease trajectory.  Incontinent of bowel and bladder FAST 6D.  Encourage reminiscence and use cueing as needed, engaging environment, encourage word search/puzzles.  Continue ongoing supportive care.  Routine CBC CMP  Hemiparesis/hemiplegia: Right side. Status post CVA 2020; continue physical activity as tolerated, passive/active range of motion.  Patient is bed bound, non ambulatory, max assist with ADLs.  Balance of rest and performance activity.  Fall precautions Orthostatic hypotension: Lasix on hold for now. Managed with Midodrine.  BP  readings at home within normal range.  CHF: Lasix on hold for now. Adhere to salt and liquid limits.  Elevate bilateral lower extremities.  Report edema or weight gain of 2 pounds/day or 5 pounds/week. Constipation: Caregiver reports regular bowel movement.  Continue bowel regimen-Milk of Magnesium, Senokot.  Spouse also uses  prune juice.  Fragile skin: Related to advanced age.  Skin tears to bilateral lower extremities, scabbing over, no redness/warmth or purulent discharge.  If bleed, clean and cover with nonadherent dressing.  Continue to optimize ongoing care. Follow up: Palliative care will continue to follow for complex medical decision making, advance care planning, and clarification of goals.  Follow-up in person visit in a week.  Encouraged to call provider sooner with any concerns.   Family /Caregiver/Community Supports: Patient lives at home with spouse who is her primary caregiver. Patient enjoys company from neighbors and loves to read.  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  HOSPICE ELIGIBILITY/DIAGNOSIS: TBD  Chief Complaint: Follow up visit  HISTORY OF PRESENT ILLNESS:  Robert Rowe is a 88 y.o. year old male  with multiple morbidities requiring close monitoring and with high risk of complications and  mortality: Progressive worsening dementia, left hemiplegia/hemiparesis status post CVA, chronic diastolic congestive heart failure, fragile skin.   Patient denies pain/discomfort; no headaches or dizziness, no fever or chills.  Spouse and caregiver reports fragile skin that easily tears or bruises; current tear had a small bleed which stopped with pressure, scabbing over.  History obtained from review of EMR, discussion with primary team, caregiver, family and/or Mr. Graybeal.  Review and summarization of Epic  records shows history from other than patient. Rest of 10 point ROS asked and negative.  I reviewed as needed, available labs, patient  records, imaging, studies and related documents from the EMR.  Independent interpretation of tests results    PAST MEDICAL HISTORY:  Active Ambulatory Problems    Diagnosis Date Noted   Dyslipidemia 04/05/2007   GERD 12/03/2007   BENIGN PROSTATIC HYPERTROPHY 04/05/2007   Rheumatoid arthritis ([place]) 04/05/2007   Osteoarthritis 04/05/2007   PEDAL EDEMA 05/01/2008   PALPITATIONS 02/12/2010   CHEST DISCOMFORT 04/25/2009   History of cardiovascular disorder 04/05/2007   DOE (dyspnea on exertion) 03/23/2011   Dysphagia 02/22/2013   Stricture and stenosis of esophagus 03/12/2013   Hypothyroidism 05/09/2013   PVC's (premature ventricular contractions) 08/22/2013   Awareness alteration, transient 02/26/2015   Benign prostatic hyperplasia with urinary obstruction 03/19/2015   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   Intracranial vascular stenosis 11/09/2015   Mild cognitive impairment 11/09/2015   Chronic diastolic CHF (congestive heart failure) ([place]) 11/01/2016   Essential hypertension 11/01/2016   Weakness generalized 06/13/2017   Abnormal diffusion capacity determined by pulmonary function test 06/13/2017   CKD (chronic kidney disease) stage 3, GFR 30-59 ml/min (HCC) 05/08/2018   Chest pain 05/15/2018   TIA (transient ischemic attack) 12/31/2018   CVA (cerebral vascular accident) ([place]) 12/31/2018   History of stroke 01/01/2019   BPV (benign positional vertigo) 01/02/2019   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   Gait disturbance, post-stroke    Cholecystitis    Acute cholecystitis 01/14/2019   Palliative care by specialist    DNR (do not resuscitate) discussion    Pyelonephritis 11/10/2021   Sepsis ([place]) 11/10/2021   Acute kidney injury superimposed on CKD ([place]) 11/10/2021   Prolonged QT interval 11/10/2021   History of CVA (cerebrovascular accident) 11/10/2021   Paroxysmal atrial fibrillation ([place]) 11/23/2021   Resolved Ambulatory Problems    Diagnosis Date Noted    Chest pain 11/15/2015   Pain in the chest    Intractable abdominal pain 08/13/3006   Acute diastolic CHF (congestive heart failure) ([place]) 04/13/2021   Acute on chronic heart failure ([place]) 04/13/2021   Acute exacerbation of CHF (congestive heart failure) ([place]) 04/14/2021   Past Medical History:  Diagnosis Date   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, 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   Chronic kidney disease    DJD (degenerative joint disease)    Dysrhythmia    Hiatal hernia    History of nuclear stress test    HYPERLIPIDEMIA 04/05/2007   Osteoarth NOS-Unspec 04/05/2007   Rheumatoid arthritis(714.0) 04/05/2007   Shortness of breath dyspnea    TRANSIENT ISCHEMIC ATTACK, HX OF 04/05/2007    SOCIAL HX:  Social History   Tobacco Use   Smoking status: Former    Types: Cigarettes    Quit date: 08/13/1953    Years since quitting: 68.7   Smokeless tobacco: Never   Tobacco comments:    1942-1954 up to 1 pack/day  Substance Use Topics   Alcohol use: Yes    Comment: occ vodka     FAMILY HX:  Family History  Problem Relation Age of Onset   Breast cancer Mother    Heart disease Father    Heart attack Father    Lymphoma Brother    Stroke Neg Hx    Hypertension Neg Hx       ALLERGIES:  Allergies  Allergen Reactions   Codeine Other (See Comments)    Patient was "all over the place"- undesired side effect   Sulfasalazine Other (  See Comments)    Reaction not recalled   Sulfonamide Derivatives Other (See Comments)    Reaction not recalled      PERTINENT MEDICATIONS:  Outpatient Encounter Medications as of 04/11/2022  Medication Sig   acetaminophen (TYLENOL) 325 MG tablet Take 650 mg by mouth at bedtime.   albuterol (VENTOLIN HFA) 108 (90 Base) MCG/ACT inhaler INHALE 2 PUFFS INTO THE LUNGS EVERY 6 HOURS AS NEEDED FOR WHEEZING (COUGH).   amiodarone (PACERONE) 200 MG tablet Take 1 tablet (200 mg) by mouth daily   apixaban (ELIQUIS) 2.5 MG TABS tablet Take 1 tablet (2.5 mg total) by mouth 2 (two) times daily.    atorvastatin (LIPITOR) 40 MG tablet TAKE 1 TABLET BY MOUTH  DAILY AT 6 PM   ketoconazole (NIZORAL) 2 % cream APPLY 1 APPLICATION TOPICALLY 2 (TWO) TIMES DAILY AS NEEDED FOR IRRITATION.   latanoprost (XALATAN) 0.005 % ophthalmic solution Place 1 drop into both eyes at bedtime. For Glaucoma   levothyroxine (SYNTHROID) 50 MCG tablet TAKE 1 TABLET BY MOUTH  DAILY   magnesium hydroxide (MILK OF MAGNESIA) 400 MG/5ML suspension Take 30 mLs by mouth See admin instructions. Constipation (1 of 4 ) If no BM in 3 days, give 30 cc Milk of Magnesium p.o. x 1 dose in 24 hours as needed ( Do  Not use standing constipation orders for residents with renal failure CFR less than 30. Contact MD for orders)   meclizine (ANTIVERT) 12.5 MG tablet Take 1 tablet (12.5 mg total) by mouth 3 (three) times daily as needed for dizziness.   midodrine (PROAMATINE) 10 MG tablet TAKE 1 TABLET BY MOUTH TWICE A DAY WITH A MEAL   nitroGLYCERIN (NITROSTAT) 0.4 MG SL tablet Place 1 tablet (0.4 mg total) under the tongue every 5 (five) minutes as needed (for Esophageal spasms).   pantoprazole (PROTONIX) 40 MG tablet TAKE 1 TABLET BY MOUTH EVERY DAY   senna-docusate (SENOKOT-S) 8.6-50 MG tablet Take 1 tablet by mouth at bedtime as needed for moderate constipation.   triamcinolone (KENALOG) 0.1 % APPLY TO AFFECTED RASH TWICE DAILY AS NEEDED. (Patient taking differently: Apply 1 application topically 2 (two) times daily as needed (rash).)   No facility-administered encounter medications on file as of 04/11/2022.   I spent 45 minutes providing this consultation; time includes spent with patient/family, chart review and documentation. More than 50% of the time in this consultation was spent on care coordination  Thank you for the opportunity to participate in the care of Mr. Curfman.  The palliative care team will continue to follow. Please call our office at [phone] if we can be of additional assistance.   Note: Portions of this note were  generated with Lobbyist. Dictation errors may occur despite best attempts at proofreading.  Teodoro Spray, NP

## 2022-04-18 ENCOUNTER — Other Ambulatory Visit: Payer: Medicare Other | Admitting: Hospice

## 2022-04-18 ENCOUNTER — Other Ambulatory Visit: Payer: Self-pay | Admitting: Family Medicine

## 2022-04-18 DIAGNOSIS — I69354 Hemiplegia and hemiparesis following cerebral infarction affecting left non-dominant side: Secondary | ICD-10-CM

## 2022-04-18 DIAGNOSIS — R238 Other skin changes: Secondary | ICD-10-CM | POA: Diagnosis not present

## 2022-04-18 DIAGNOSIS — R627 Adult failure to thrive: Secondary | ICD-10-CM

## 2022-04-18 DIAGNOSIS — Z515 Encounter for palliative care: Secondary | ICD-10-CM | POA: Diagnosis not present

## 2022-04-18 DIAGNOSIS — I5032 Chronic diastolic (congestive) heart failure: Secondary | ICD-10-CM

## 2022-04-18 DIAGNOSIS — D692 Other nonthrombocytopenic purpura: Secondary | ICD-10-CM

## 2022-04-18 DIAGNOSIS — F0390 Unspecified dementia without behavioral disturbance: Secondary | ICD-10-CM

## 2022-04-18 NOTE — Progress Notes (Addendum)
[facility] Pall[facility] Consult Note Telephone: [phone]  Fax: [phone]  PATIENT NAME: Robert Rowe [address] [place] [address] [phone] (home)  DOB: 07-03-1918 MRN: [id number]  PRIMARY CARE PROVIDER:    [name], MD,  [address] [address] [phone]  REFERRING PROVIDER:   Eulas Rowe, [place],   [address] [phone]  RESPONSIBLE PARTY:   [phone] Caregiver - Robert Rowe of Ten     Name Relation Home Work Mobile   [place] Spouse [phone]  [phone]   Robert Rowe, [place]   [phone]       Palliative Care was asked to follow this patient to address advance care planning, complex medical decision making and goals of care clarification.  Annmarie and Helene Kelp are with patient during visit.  Annmarie requested this follow-up visit due to patient's ongoing decline in functional status.    ASSESSMENT AND / RECOMMENDATIONS:   CODE STATUS: Patient is a Do Not Resuscitate.    Goals of Care: Goals include to maximize quality of life and symptom management.  Hospice service is desired. Annmarie affirms she does not want hospitalization for patient.  Discussion not to resuscitate or to de-escalate care because of poor prognosis and advanced age.  Annmarie affirms comfort measures.  Primary request NP to call PCPs office to discuss need for hospice service.  PCPs office is closed today; NP sent PCP message via secure chat.  PCP responded saying patient is appropriate for hospice service; he will send in hospice referral for patient.  Annmarie updated accordingly. Visit consisted of counseling and education dealing with the complex and emotionally intense issues of symptom management and palliative care in the setting of serious and potentially life-threatening illness. Palliative care  team will continue to support patient, patient's family, and medical team.  Symptom Management/Plan: Dementia: Progressive decline in functional status, and memory loss/confusion in line with Dementia disease trajectory.  Incontinent of bowel and bladder non ambulatory, FAST 7c, needs support with props/pillows while sitting up to avoid leaning over.  Continue to encourage reminiscence and use cueing as needed, encourage word search/puzzles.  Continue ongoing supportive care.   Hemiparesis/hemiplegia: Right side. Status Rowe CVA 2020; continue physical activity as tolerated, passive/active range of motion.  Patient is max assist with ADLs.   Fall precautions  CHF: Lasix on hold for now due to history of orthostatic hypotension.  Adhere to salt and liquid limits.  Elevate bilateral lower extremities.  Report edema or weight gain of 2 pounds/day or 5 pounds/week. Constipation: Caregiver reports regular bowel movement.  Continue bowel regimen-Milk of Magnesium, Senokot.  Spouse also uses  prune juice.  Fragile skin/senile purpura: Scattered ecchymoses.  Skin tears and bleeds easily.  Likely related to advanced age.  Patient is on Eliquis 2.5 mg daily.  Skin tear to left knee, covered with nonadherent dressing.  No edema/redness/warmth. caregiver to continue to cleanse with normal saline and apply nonadherent dressing.  May hold Eliquis on day of dressing change to minimize bleeding.   Follow up: Palliative care will continue to follow for complex medical decision making, advance care planning, and clarification of goals.  Follow-up in person visit in a week.  Encouraged to call provider sooner with any concerns.   Family /Caregiver/Community Supports: Patient lives at home with spouse who is his primary caregiver. Patient enjoys company from neighbors and loves to  read.  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  HOSPICE ELIGIBILITY/DIAGNOSIS: TBD  Chief Complaint:  Follow up visit  HISTORY OF PRESENT ILLNESS:  Robert Rowe is a 88 y.o. year old male  with multiple morbidities requiring close monitoring and with high risk of complications and  mortality: Progressive worsening dementia, left hemiplegia/hemiparesis status Rowe CVA, hypertensive heart disease with chronic diastolic congestive heart failure, fragile skin.   Patient denies pain/discomfort; no headaches or dizziness, no fever or chills. History obtained from review of EMR, discussion with primary team, caregiver, family and/or [name].  Review and summarization of Epic records shows history from other than patient. Rest of 10 point ROS asked and negative.  I reviewed as needed, available labs, patient records, imaging, studies and related documents from the EMR.  Independent interpretation of tests results  Physical Exam: Height/Weight: 5 feet 5 inches/145 Ibs BP 118/64 P 81 R 18 O2 92% room air Constitutional: NAD General: Well groomed, cooperative EYES: anicteric sclera, lids intact, no discharge  ENMT: Moist mucous membrane CV: S1 S2, RRR, no LE edema Pulmonary: LCTA, no increased work of breathing, no cough, Abdomen: active BS + 4 quadrants, soft and non tender GU: no suprapubic tenderness MSK: weakness, nonambulatory, limited ROM Skin: warm and dry, dressing to left knee, clean dry and intact.  Scabbed skin tears to bilateral lower legs.  Scattered ecchymoses Neuro:  weakness, otherwise non focal, memory loss Psych: non-anxious affect  PAST MEDICAL HISTORY:  Active Ambulatory Problems    Diagnosis Date Noted   Dyslipidemia 04/05/2007   GERD 12/03/2007   BENIGN PROSTATIC HYPERTROPHY 04/05/2007   Rheumatoid arthritis ([place]) 04/05/2007   Osteoarthritis 04/05/2007   PEDAL EDEMA 05/01/2008   PALPITATIONS 02/12/2010   CHEST DISCOMFORT 04/25/2009   History of cardiovascular disorder 04/05/2007   DOE (dyspnea on exertion) 03/23/2011   Dysphagia 02/22/2013   Stricture and stenosis  of esophagus 03/12/2013   Hypothyroidism 05/09/2013   PVC's (premature ventricular contractions) 08/22/2013   Awareness alteration, transient 02/26/2015   Benign prostatic hyperplasia with urinary obstruction 03/19/2015   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   Intracranial vascular stenosis 11/09/2015   Mild cognitive impairment 11/09/2015   Chronic diastolic CHF (congestive heart failure) ([place]) 11/01/2016   Essential hypertension 11/01/2016   Weakness generalized 06/13/2017   Abnormal diffusion capacity determined by pulmonary function test 06/13/2017   CKD (chronic kidney disease) stage 3, GFR 30-59 ml/min (HCC) 05/08/2018   Chest pain 05/15/2018   TIA (transient ischemic attack) 12/31/2018   CVA (cerebral vascular accident) ([place]) 12/31/2018   History of stroke 01/01/2019   BPV (benign positional vertigo) 01/02/2019   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   Gait disturbance, Rowe-stroke    Cholecystitis    Acute cholecystitis 01/14/2019   Palliative care by specialist    DNR (do not resuscitate) discussion    Pyelonephritis 11/10/2021   Sepsis ([place]) 11/10/2021   Acute kidney injury superimposed on CKD ([place]) 11/10/2021   Prolonged QT interval 11/10/2021   History of CVA (cerebrovascular accident) 11/10/2021   Paroxysmal atrial fibrillation ([place]) 11/23/2021   Resolved Ambulatory Problems    Diagnosis Date Noted   Chest pain 11/15/2015   Pain in the chest    Intractable abdominal pain 04/54/0981   Acute diastolic CHF (congestive heart failure) ([place]) 04/13/2021   Acute on chronic heart failure ([place]) 04/13/2021   Acute exacerbation of CHF (congestive heart failure) ([place]) 04/14/2021   Past Medical History:  Diagnosis Date   Blind left eye  February 26, 1957   Chronic kidney disease    DJD (degenerative joint disease)    Dysrhythmia    Hiatal hernia    History of nuclear stress test    HYPERLIPIDEMIA 04/05/2007   Osteoarth NOS-Unspec 04/05/2007   Rheumatoid  arthritis(714.0) 04/05/2007   Shortness of breath dyspnea    TRANSIENT ISCHEMIC ATTACK, HX OF 04/05/2007    SOCIAL HX:  Social History   Tobacco Use   Smoking status: Former    Types: Cigarettes    Quit date: 08/13/1953    Years since quitting: 68.7   Smokeless tobacco: Never   Tobacco comments:    1942-1954 up to 1 pack/day  Substance Use Topics   Alcohol use: Yes    Comment: occ vodka     FAMILY HX:  Family History  Problem Relation Age of Onset   Breast cancer Mother    Heart disease Father    Heart attack Father    Lymphoma Brother    Stroke Neg Hx    Hypertension Neg Hx       ALLERGIES:  Allergies  Allergen Reactions   Codeine Other (See Comments)    Patient was "all over the place"- undesired side effect   Sulfasalazine Other (See Comments)    Reaction not recalled   Sulfonamide Derivatives Other (See Comments)    Reaction not recalled      PERTINENT MEDICATIONS:  Outpatient Encounter Medications as of 04/18/2022  Medication Sig   acetaminophen (TYLENOL) 325 MG tablet Take 650 mg by mouth at bedtime.   albuterol (VENTOLIN HFA) 108 (90 Base) MCG/ACT inhaler INHALE 2 PUFFS INTO THE LUNGS EVERY 6 HOURS AS NEEDED FOR WHEEZING (COUGH).   amiodarone (PACERONE) 200 MG tablet Take 1 tablet (200 mg) by mouth daily   apixaban (ELIQUIS) 2.5 MG TABS tablet Take 1 tablet (2.5 mg total) by mouth 2 (two) times daily.   atorvastatin (LIPITOR) 40 MG tablet TAKE 1 TABLET BY MOUTH  DAILY AT 6 PM   ketoconazole (NIZORAL) 2 % cream APPLY 1 APPLICATION TOPICALLY 2 (TWO) TIMES DAILY AS NEEDED FOR IRRITATION.   latanoprost (XALATAN) 0.005 % ophthalmic solution Place 1 drop into both eyes at bedtime. For Glaucoma   levothyroxine (SYNTHROID) 50 MCG tablet TAKE 1 TABLET BY MOUTH  DAILY   magnesium hydroxide (MILK OF MAGNESIA) 400 MG/5ML suspension Take 30 mLs by mouth See admin instructions. Constipation (1 of 4 ) If no BM in 3 days, give 30 cc Milk of Magnesium p.o. x 1 dose in 24 hours  as needed ( Do  Not use standing constipation orders for residents with renal failure CFR less than 30. Contact MD for orders)   meclizine (ANTIVERT) 12.5 MG tablet Take 1 tablet (12.5 mg total) by mouth 3 (three) times daily as needed for dizziness.   midodrine (PROAMATINE) 10 MG tablet TAKE 1 TABLET BY MOUTH TWICE A DAY WITH A MEAL   nitroGLYCERIN (NITROSTAT) 0.4 MG SL tablet Place 1 tablet (0.4 mg total) under the tongue every 5 (five) minutes as needed (for Esophageal spasms).   pantoprazole (PROTONIX) 40 MG tablet TAKE 1 TABLET BY MOUTH EVERY DAY   senna-docusate (SENOKOT-S) 8.6-50 MG tablet Take 1 tablet by mouth at bedtime as needed for moderate constipation.   triamcinolone (KENALOG) 0.1 % APPLY TO AFFECTED RASH TWICE DAILY AS NEEDED. (Patient taking differently: Apply 1 application topically 2 (two) times daily as needed (rash).)   No facility-administered encounter medications on file as of 04/18/2022.   I  spent 60 minutes providing this consultation; time includes spent with patient/family, chart review and documentation. More than 50% of the time in this consultation was spent on care coordination  Thank you for the opportunity to participate in the care of Mr. Dacosta.  The palliative care team will continue to follow. Please call our office at [phone] if we can be of additional assistance.   Note: Portions of this note were generated with Lobbyist. Dictation errors may occur despite best attempts at proofreading.  Teodoro Spray, NP

## 2022-04-20 ENCOUNTER — Telehealth: Payer: Self-pay | Admitting: Family Medicine

## 2022-04-20 NOTE — Telephone Encounter (Signed)
Felicia lpn with [facility] is calling and pt has rash between his leg that is burning and felicia would like to know if md could call in diflucan. Pt has been trying OTC antifungal powder with no relief  [id number] [place] [place] - [address]:  [phone] Fax:  [phone]

## 2022-04-21 MED ORDER — FLUCONAZOLE 100 MG PO TABS: ORAL_TABLET | ORAL | 0 refills | Status: AC

## 2022-04-21 NOTE — Telephone Encounter (Signed)
Left a message for Robert Rowe to return my call. Pt's wife informed that rx has been sent.

## 2022-04-29 DIAGNOSIS — R092 Respiratory arrest: Secondary | ICD-10-CM | POA: Diagnosis not present

## 2022-04-29 DIAGNOSIS — R404 Transient alteration of awareness: Secondary | ICD-10-CM | POA: Diagnosis not present

## 2022-04-29 DIAGNOSIS — I469 Cardiac arrest, cause unspecified: Secondary | ICD-10-CM | POA: Diagnosis not present

## 2022-05-21 DIAGNOSIS — 419620001 Death: Secondary | SNOMED CT | POA: Diagnosis not present

## 2022-05-21 DEATH — deceased

## 2022-06-10 ENCOUNTER — Other Ambulatory Visit: Payer: Self-pay | Admitting: Internal Medicine

## 2022-06-24 ENCOUNTER — Other Ambulatory Visit: Payer: Self-pay | Admitting: Family Medicine

## 2022-11-02 ENCOUNTER — Telehealth: Payer: Self-pay | Admitting: *Deleted
